# Patient Record
Sex: Female | Born: 1950
Health system: Southern US, Community
[De-identification: ages and names within clinical notes are randomized; demographics above are authoritative.]

## PROBLEM LIST (undated history)

## (undated) ENCOUNTER — Ambulatory Visit (HOSPITAL_COMMUNITY): Discharge status: Absent without leave | Payer: Medicare PPO

## (undated) ENCOUNTER — Ambulatory Visit (HOSPITAL_COMMUNITY): Admission: EM | Payer: Medicare PPO | Source: Home / Self Care

## (undated) DIAGNOSIS — F3281 Premenstrual dysphoric disorder: Secondary | ICD-10-CM

## (undated) DIAGNOSIS — I10 Essential (primary) hypertension: Secondary | ICD-10-CM

## (undated) DIAGNOSIS — K601 Chronic anal fissure: Secondary | ICD-10-CM

## (undated) DIAGNOSIS — H9190 Unspecified hearing loss, unspecified ear: Secondary | ICD-10-CM

## (undated) DIAGNOSIS — D649 Anemia, unspecified: Secondary | ICD-10-CM

## (undated) DIAGNOSIS — H903 Sensorineural hearing loss, bilateral: Secondary | ICD-10-CM

## (undated) DIAGNOSIS — E119 Type 2 diabetes mellitus without complications: Secondary | ICD-10-CM

## (undated) DIAGNOSIS — C50919 Malignant neoplasm of unspecified site of unspecified female breast: Secondary | ICD-10-CM

## (undated) DIAGNOSIS — K219 Gastro-esophageal reflux disease without esophagitis: Secondary | ICD-10-CM

## (undated) DIAGNOSIS — D509 Iron deficiency anemia, unspecified: Secondary | ICD-10-CM

## (undated) DIAGNOSIS — G479 Sleep disorder, unspecified: Secondary | ICD-10-CM

## (undated) DIAGNOSIS — E669 Obesity, unspecified: Secondary | ICD-10-CM

## (undated) DIAGNOSIS — E785 Hyperlipidemia, unspecified: Secondary | ICD-10-CM

## (undated) HISTORY — DX: Gastro-esophageal reflux disease without esophagitis: K21.9

## (undated) HISTORY — DX: Essential (primary) hypertension: I10

## (undated) HISTORY — PX: BREAST BIOPSY: SHX20

## (undated) HISTORY — DX: Unspecified hearing loss, unspecified ear: H91.90

## (undated) HISTORY — DX: Type 2 diabetes mellitus without complications: E11.9

## (undated) HISTORY — PX: GUM SURGERY: SHX658

## (undated) HISTORY — DX: Chronic anal fissure: K60.1

## (undated) HISTORY — DX: Premenstrual dysphoric disorder: F32.81

## (undated) HISTORY — DX: Sleep disorder, unspecified: G47.9

## (undated) HISTORY — DX: Hyperlipidemia, unspecified: E78.5

## (undated) HISTORY — DX: Obesity, unspecified: E66.9

## (undated) HISTORY — PX: ABDOMINAL HYSTERECTOMY: SHX81

## (undated) HISTORY — DX: Sensorineural hearing loss, bilateral: H90.3

## (undated) HISTORY — DX: Anemia, unspecified: D64.9

## (undated) HISTORY — PX: BLADDER REPAIR: SHX76

## (undated) HISTORY — PX: TUBAL LIGATION: SHX77

## (undated) HISTORY — DX: Iron deficiency anemia, unspecified: D50.9

## (undated) HISTORY — PX: MANDIBLE FRACTURE SURGERY: SHX706

## (undated) HISTORY — DX: Malignant neoplasm of unspecified site of unspecified female breast: C50.919

---

## 1998-09-09 ENCOUNTER — Encounter (HOSPITAL_COMMUNITY): Admission: RE | Admit: 1998-09-09 | Discharge: 1998-12-08 | Payer: Self-pay | Admitting: Internal Medicine

## 1999-09-12 ENCOUNTER — Encounter (HOSPITAL_COMMUNITY): Admission: RE | Admit: 1999-09-12 | Discharge: 1999-12-11 | Payer: Self-pay | Admitting: Internal Medicine

## 2000-04-27 ENCOUNTER — Ambulatory Visit (HOSPITAL_COMMUNITY): Admission: RE | Admit: 2000-04-27 | Discharge: 2000-04-27 | Payer: Self-pay | Admitting: Gastroenterology

## 2001-01-07 ENCOUNTER — Encounter (HOSPITAL_COMMUNITY): Admission: RE | Admit: 2001-01-07 | Discharge: 2001-04-07 | Payer: Self-pay | Admitting: Internal Medicine

## 2001-01-31 ENCOUNTER — Encounter: Admission: RE | Admit: 2001-01-31 | Discharge: 2001-01-31 | Payer: Self-pay | Admitting: Internal Medicine

## 2001-01-31 ENCOUNTER — Encounter: Payer: Self-pay | Admitting: Internal Medicine

## 2001-01-31 ENCOUNTER — Inpatient Hospital Stay (HOSPITAL_COMMUNITY): Admission: AD | Admit: 2001-01-31 | Discharge: 2001-02-06 | Payer: Self-pay | Admitting: Internal Medicine

## 2001-02-04 ENCOUNTER — Encounter: Payer: Self-pay | Admitting: Internal Medicine

## 2001-03-02 ENCOUNTER — Encounter: Payer: Self-pay | Admitting: Internal Medicine

## 2001-03-02 ENCOUNTER — Ambulatory Visit (HOSPITAL_COMMUNITY): Admission: RE | Admit: 2001-03-02 | Discharge: 2001-03-02 | Payer: Self-pay | Admitting: Internal Medicine

## 2001-05-18 DIAGNOSIS — D649 Anemia, unspecified: Secondary | ICD-10-CM

## 2001-05-18 HISTORY — DX: Anemia, unspecified: D64.9

## 2001-08-25 ENCOUNTER — Other Ambulatory Visit: Admission: RE | Admit: 2001-08-25 | Discharge: 2001-08-25 | Payer: Self-pay | Admitting: Gynecology

## 2002-08-23 ENCOUNTER — Other Ambulatory Visit: Admission: RE | Admit: 2002-08-23 | Discharge: 2002-08-23 | Payer: Self-pay | Admitting: Gynecology

## 2002-08-31 ENCOUNTER — Ambulatory Visit (HOSPITAL_COMMUNITY): Admission: RE | Admit: 2002-08-31 | Discharge: 2002-08-31 | Payer: Self-pay | Admitting: Internal Medicine

## 2002-09-01 ENCOUNTER — Encounter: Payer: Self-pay | Admitting: Gynecology

## 2002-09-01 ENCOUNTER — Encounter: Admission: RE | Admit: 2002-09-01 | Discharge: 2002-09-01 | Payer: Self-pay | Admitting: Gynecology

## 2002-10-06 ENCOUNTER — Encounter (HOSPITAL_COMMUNITY): Admission: RE | Admit: 2002-10-06 | Discharge: 2002-10-27 | Payer: Self-pay | Admitting: Internal Medicine

## 2002-11-30 ENCOUNTER — Encounter (INDEPENDENT_AMBULATORY_CARE_PROVIDER_SITE_OTHER): Payer: Self-pay | Admitting: Specialist

## 2002-11-30 ENCOUNTER — Inpatient Hospital Stay (HOSPITAL_COMMUNITY): Admission: RE | Admit: 2002-11-30 | Discharge: 2002-12-03 | Payer: Self-pay | Admitting: Gynecology

## 2003-02-16 ENCOUNTER — Emergency Department (HOSPITAL_COMMUNITY): Admission: EM | Admit: 2003-02-16 | Discharge: 2003-02-16 | Payer: Self-pay | Admitting: Emergency Medicine

## 2003-02-16 ENCOUNTER — Encounter: Payer: Self-pay | Admitting: Emergency Medicine

## 2003-08-13 ENCOUNTER — Encounter: Admission: RE | Admit: 2003-08-13 | Discharge: 2003-08-13 | Payer: Self-pay | Admitting: Internal Medicine

## 2003-08-13 IMAGING — CR DG KNEE 1-2V*L*
2 series · 2 of 2 positions shown · non-contrast
Comparison: none

CLINICAL DATA: Pain and swelling.  No known injury.
 TWO VIEW LEFT KNEE ? [DATE]

[view not recorded (1 of 2)]
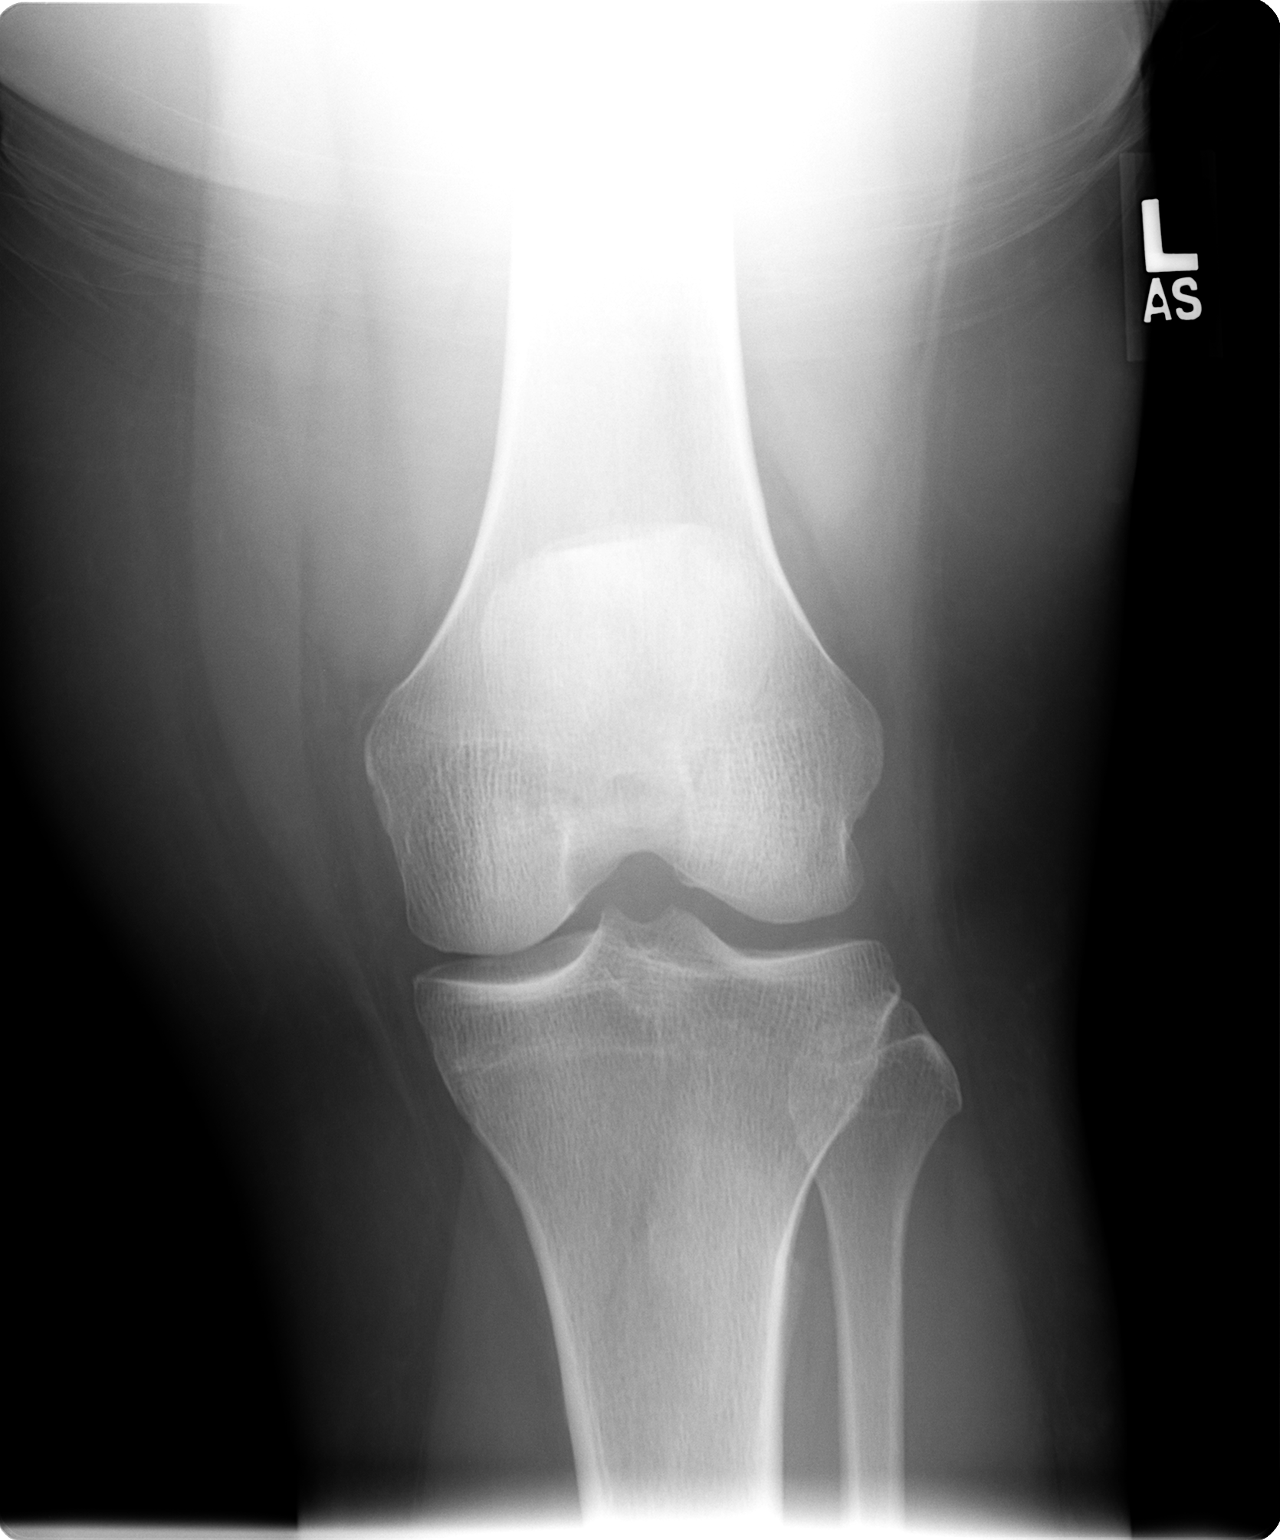

[view not recorded (2 of 2)]
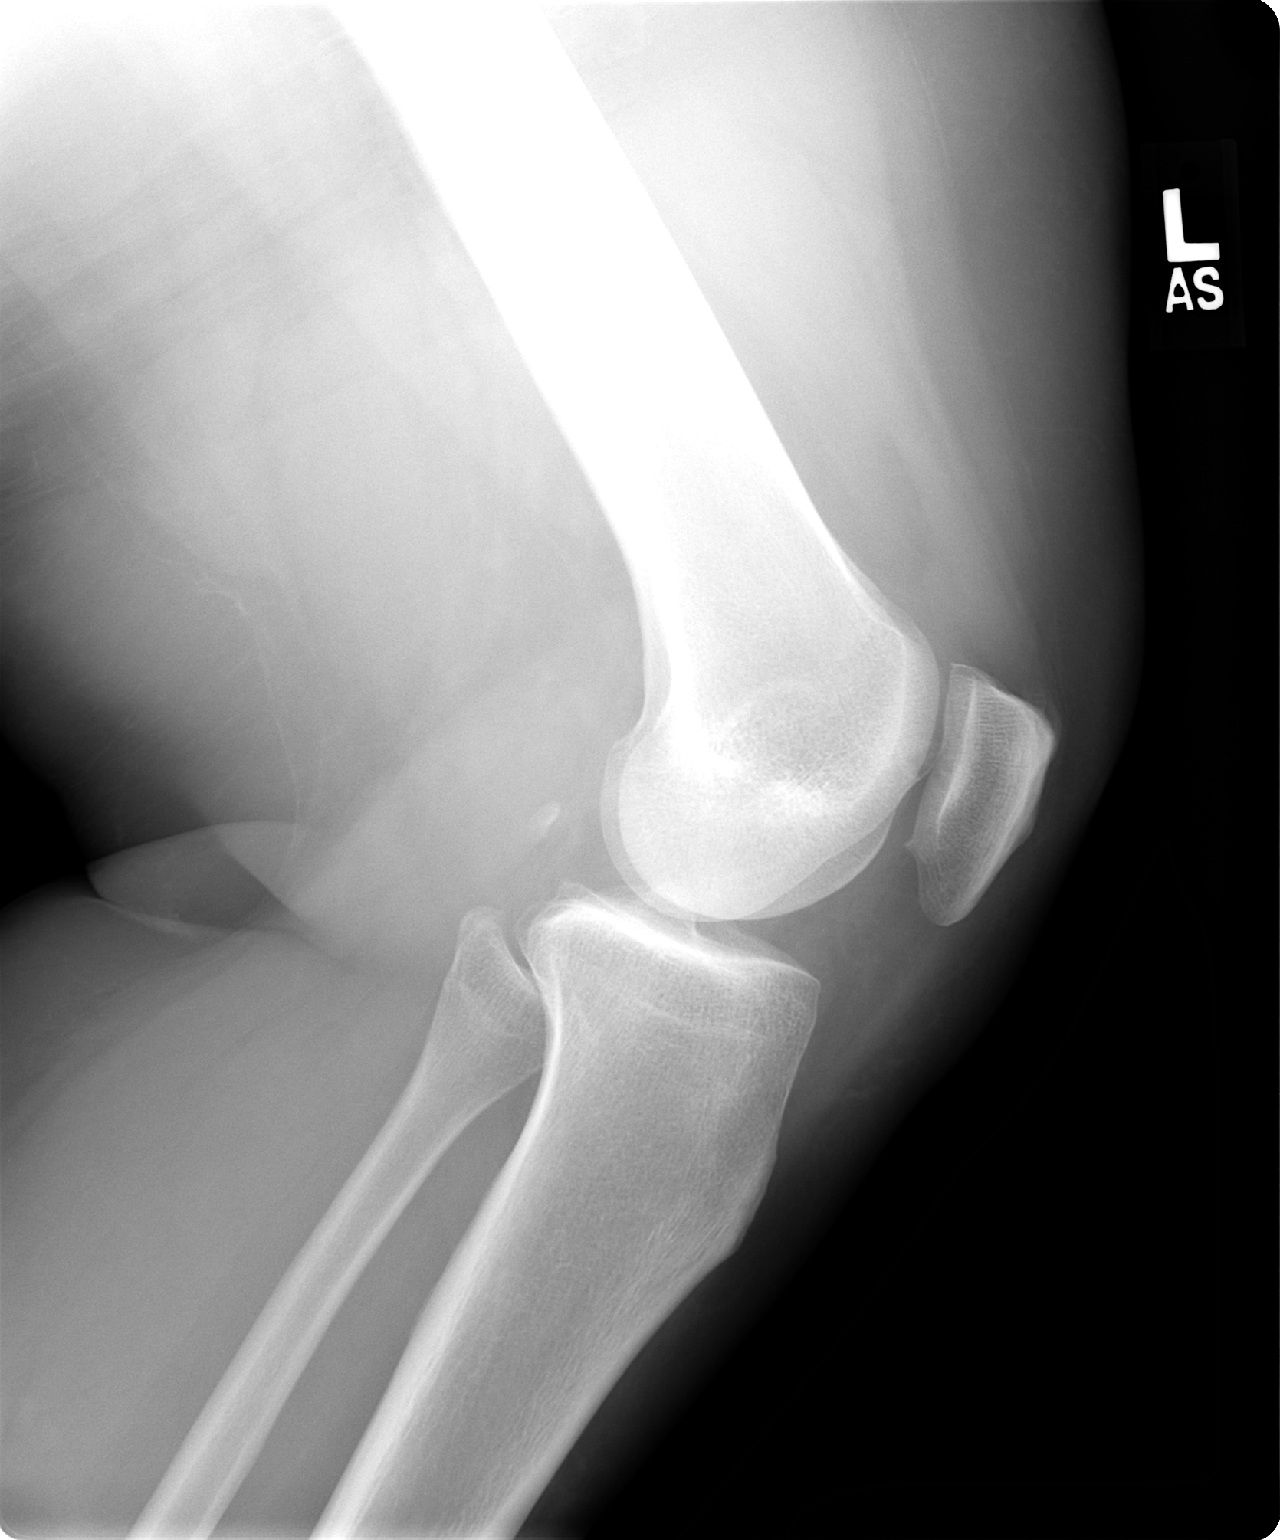

[2 of 2 positions shown; findings below may reference images not displayed]

FINDINGS: There may be a tiny knee effusion present.  Minimal osteophytic beaking off the patella at the patellofemoral compartment is noted.  No other significant abnormality is identified.
 IMPRESSION
 1.  Minimal degenerative changes primarily in the patellofemoral compartment.
 2.  Query tiny effusion.

## 2003-11-26 ENCOUNTER — Encounter: Admission: RE | Admit: 2003-11-26 | Discharge: 2003-11-26 | Payer: Self-pay | Admitting: Internal Medicine

## 2003-11-26 IMAGING — CR DG CERVICAL SPINE COMPLETE 4+V
6 series · 6 of 6 positions shown · non-contrast
Comparison: none

CLINICAL DATA: 52 year old with neck and shoulder pain.  No known injury.
 CERVICAL SPINE, COMPLETE 
 Five views of the cervical spine were performed showing mild degenerative change.  Alignment is within normal limits and there is no evidence for acute fracture dislocation.  Prevertebral soft tissues and lung apices are within normal limits. 
 IMPRESSION
 1.  No evidence for acute abnormality of the cervical spine. 
 2.  Mild degenerative changes.

[view not recorded (1 of 6)]
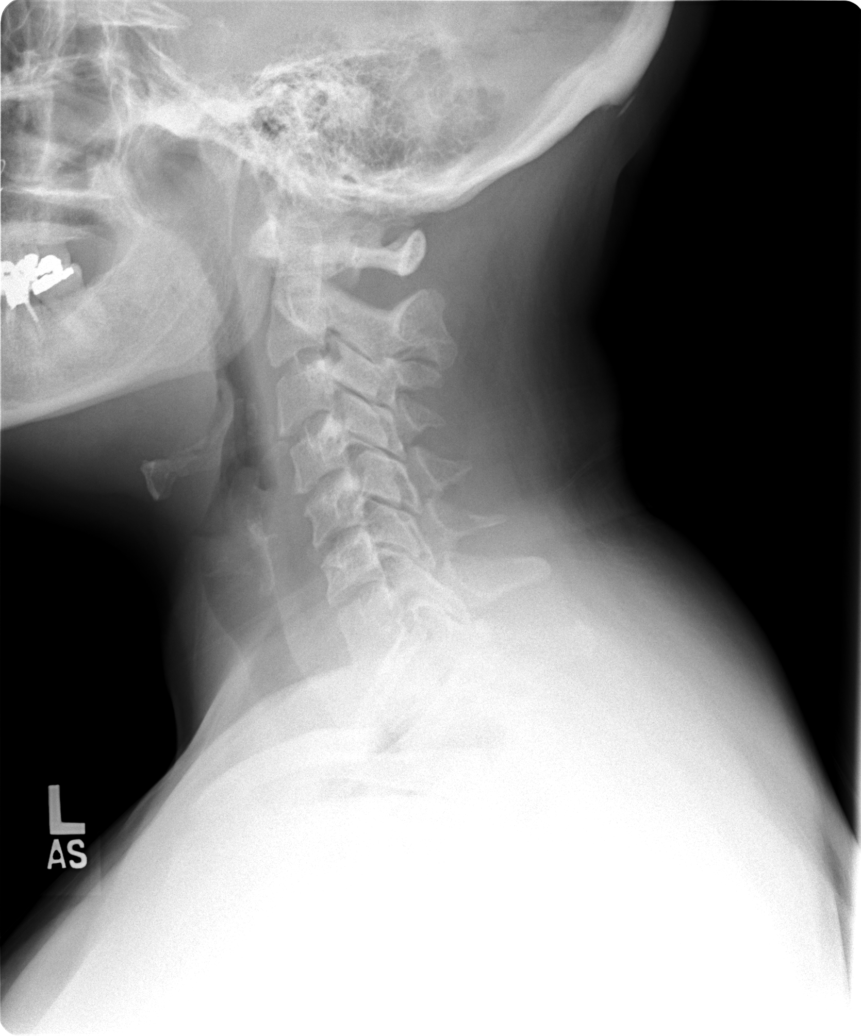

[view not recorded (2 of 6)]
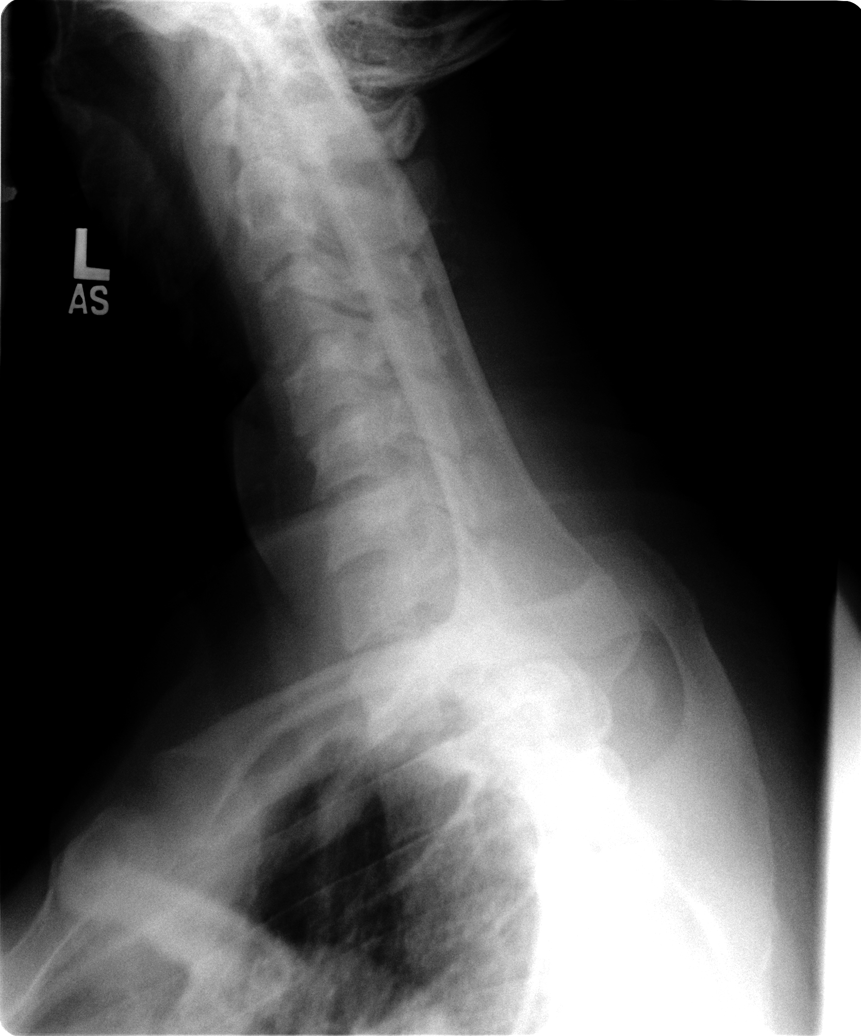

[view not recorded (3 of 6)]
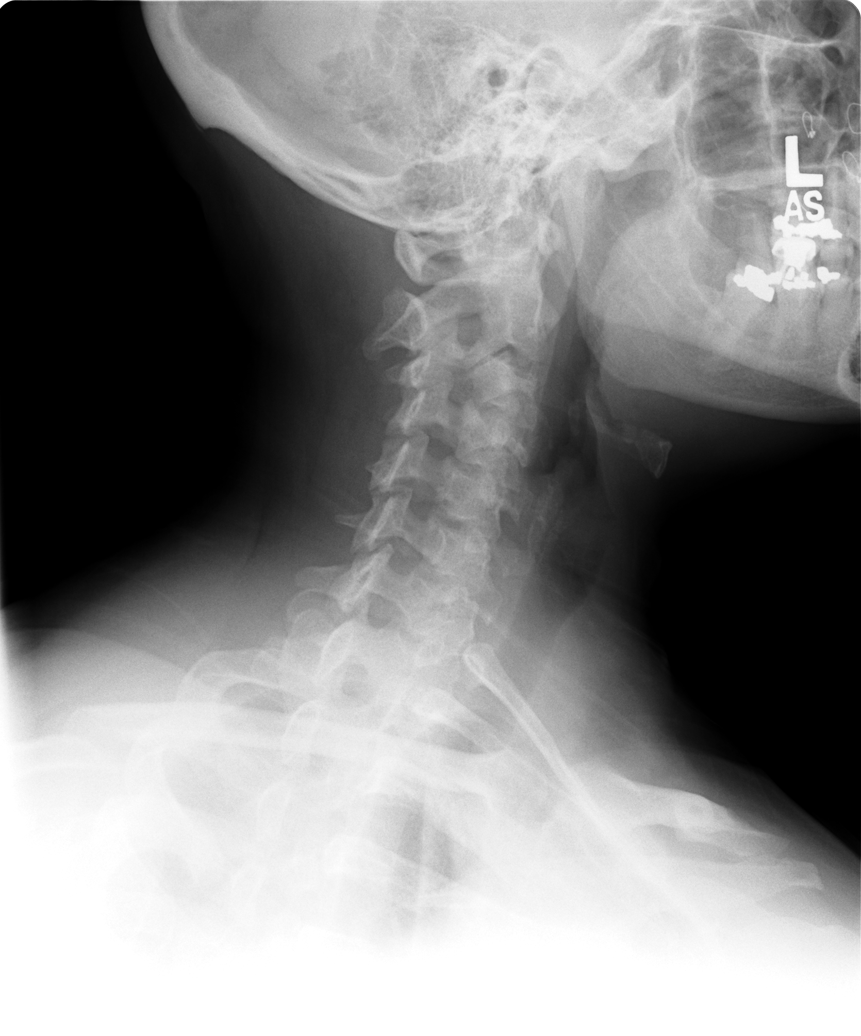

[view not recorded (4 of 6)]
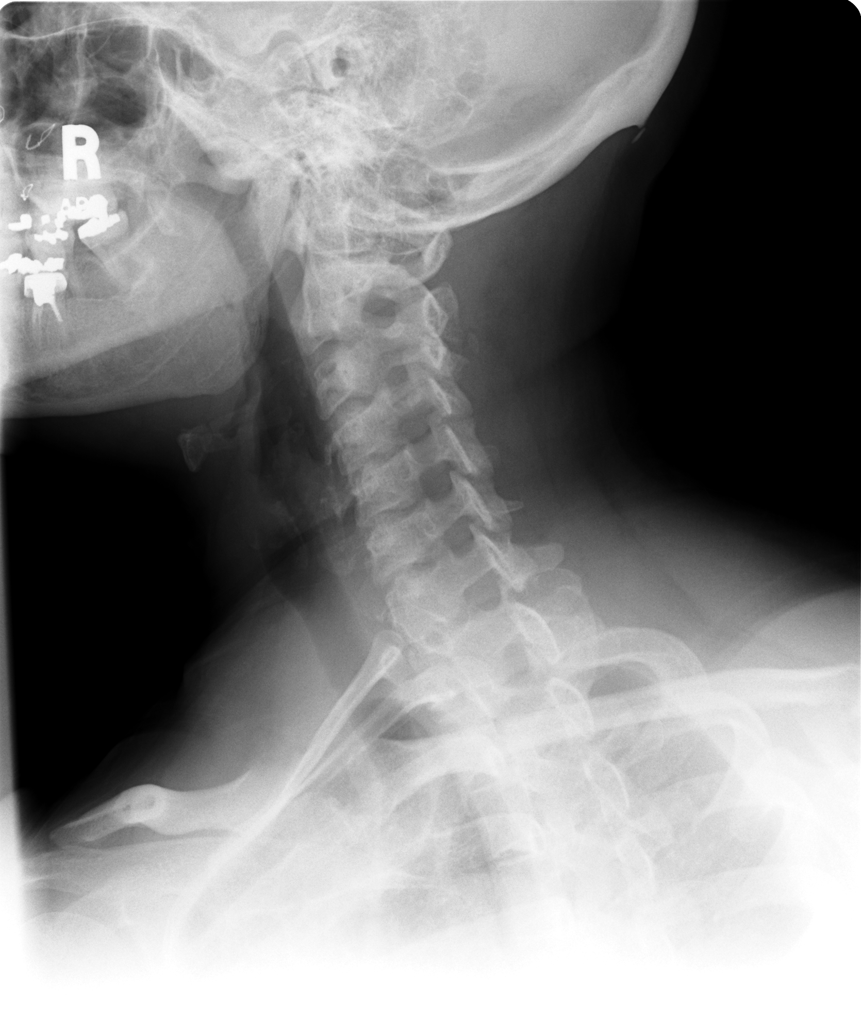

[view not recorded (5 of 6)]
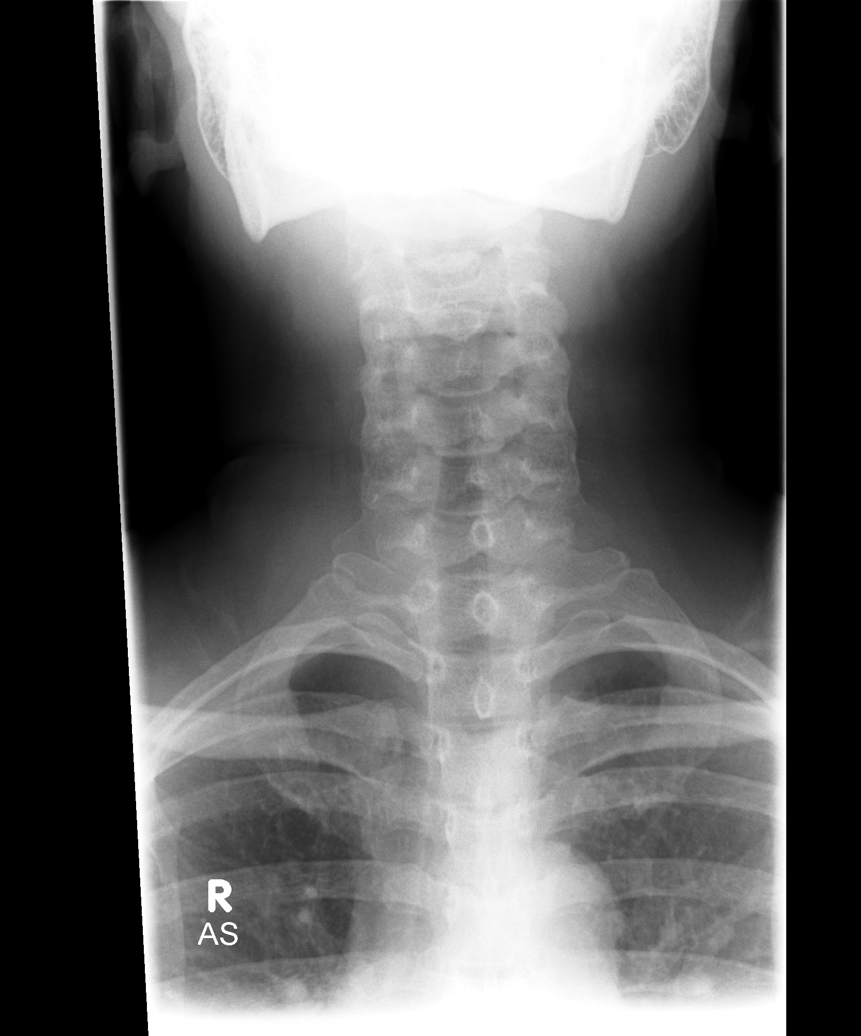

[view not recorded (6 of 6)]
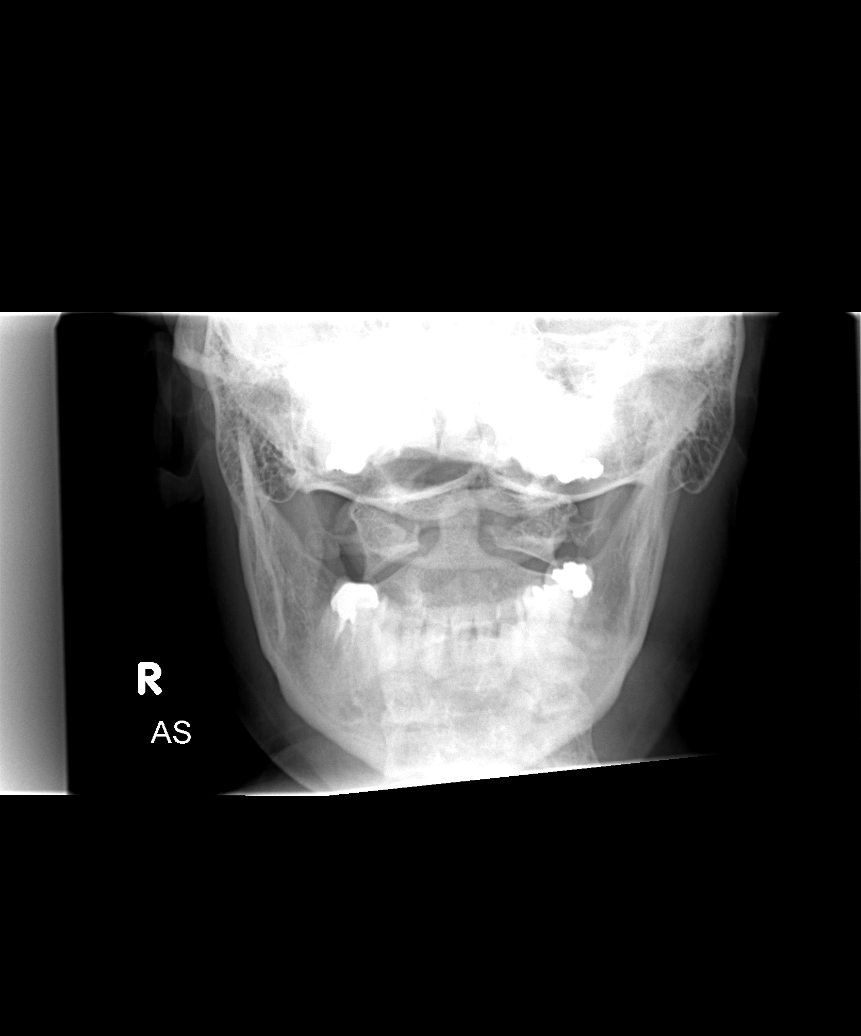

[6 of 6 positions shown; findings below may reference images not displayed]

## 2004-04-01 ENCOUNTER — Encounter: Admission: RE | Admit: 2004-04-01 | Discharge: 2004-04-01 | Payer: Self-pay | Admitting: Gynecology

## 2004-04-04 ENCOUNTER — Other Ambulatory Visit: Admission: RE | Admit: 2004-04-04 | Discharge: 2004-04-04 | Payer: Self-pay | Admitting: Gynecology

## 2004-09-08 ENCOUNTER — Encounter: Admission: RE | Admit: 2004-09-08 | Discharge: 2004-09-08 | Payer: Self-pay | Admitting: Internal Medicine

## 2004-09-08 IMAGING — CR DG CERVICAL SPINE COMPLETE 4+V
6 series · 6 of 6 positions shown · non-contrast
Comparison: none

CLINICAL DATA: Neck pain for one month. 
 CERVICAL SPINE SERIES: 
 Comparison [DATE].  
 Vertebral body height and alignment are maintained.  Prevertebral soft tissues appear normal.  Mild appearing degenerative disease cervical spine at C5-6 and C6-7 again noted.

[w c-spine lat *]
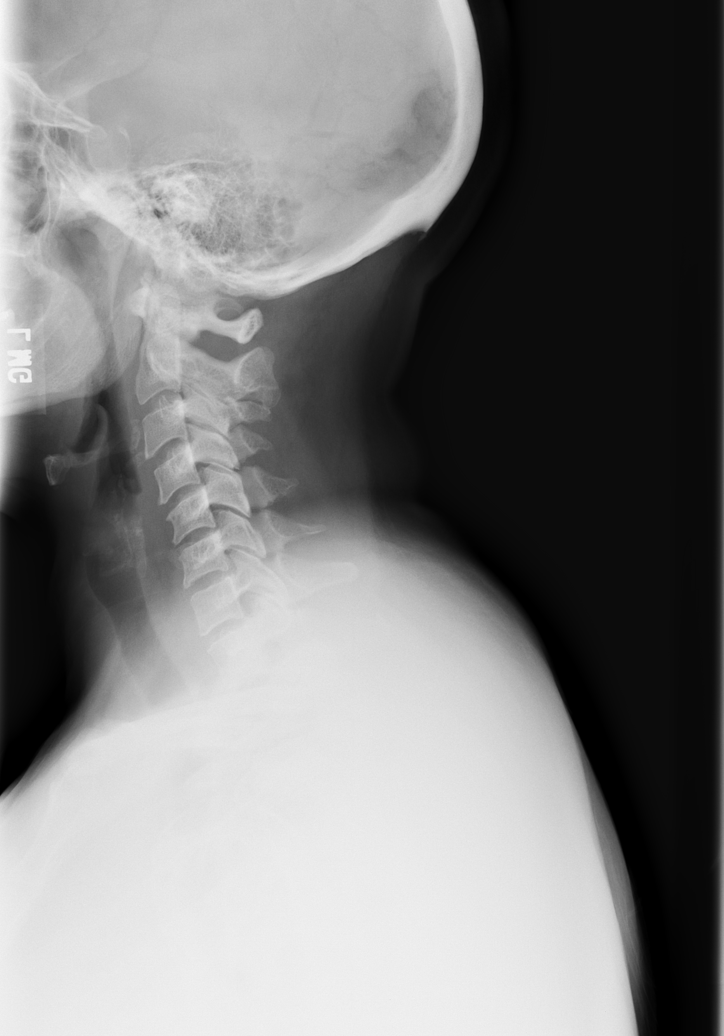

[w c-spine oblique]
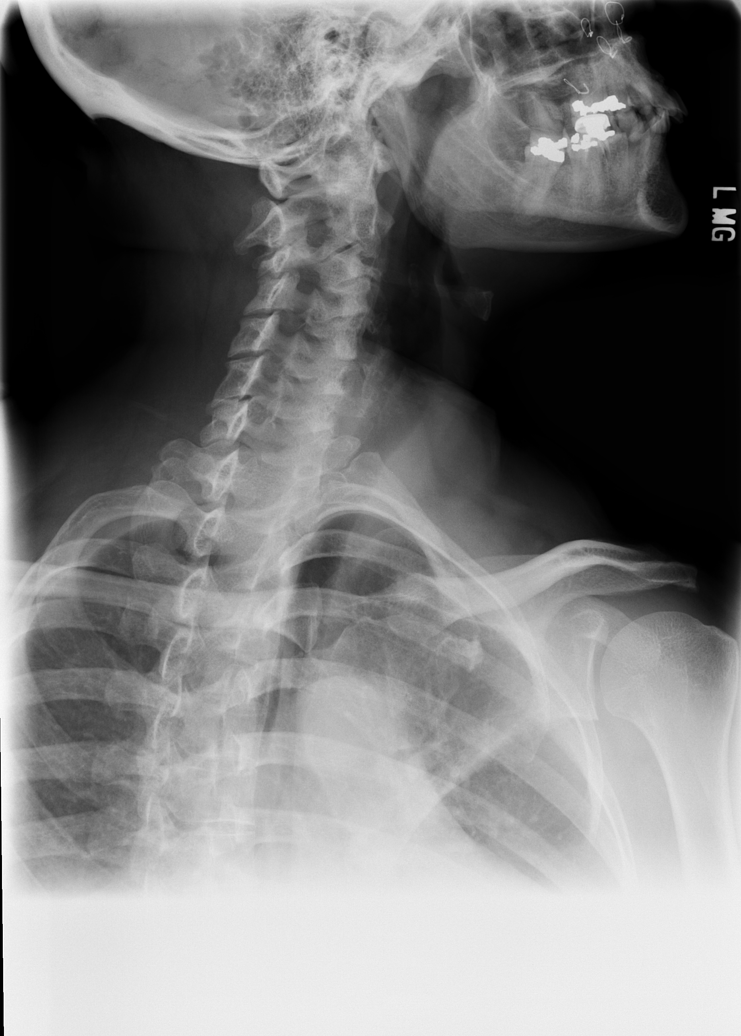

[w c-spine oblique *]
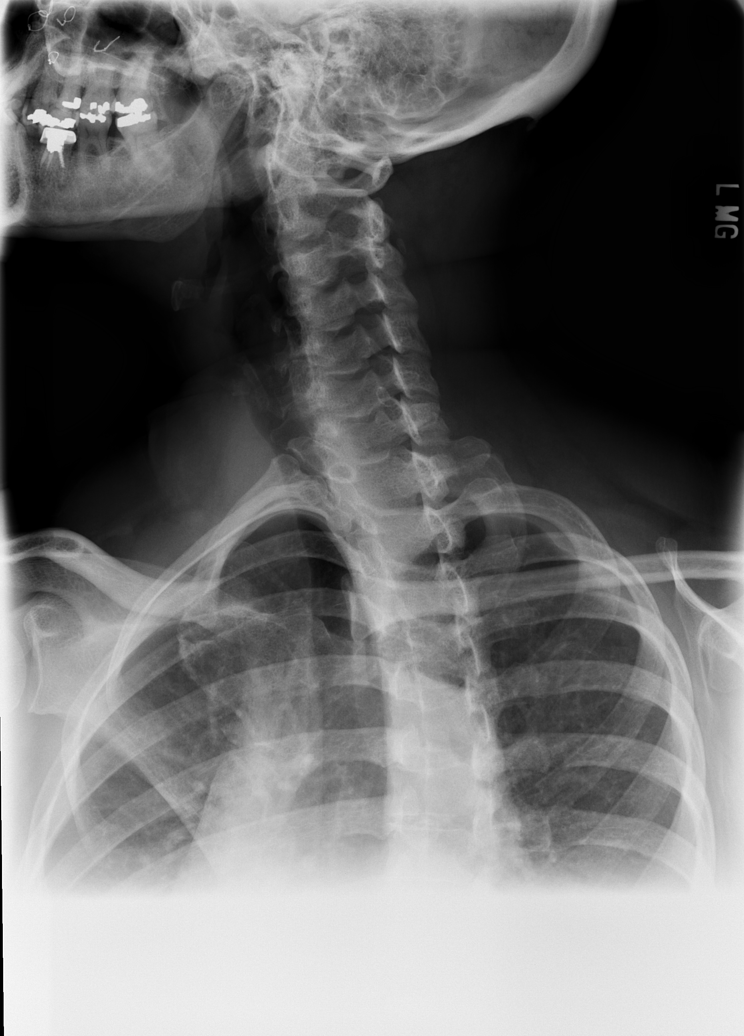

[w c-spine a.p. *]
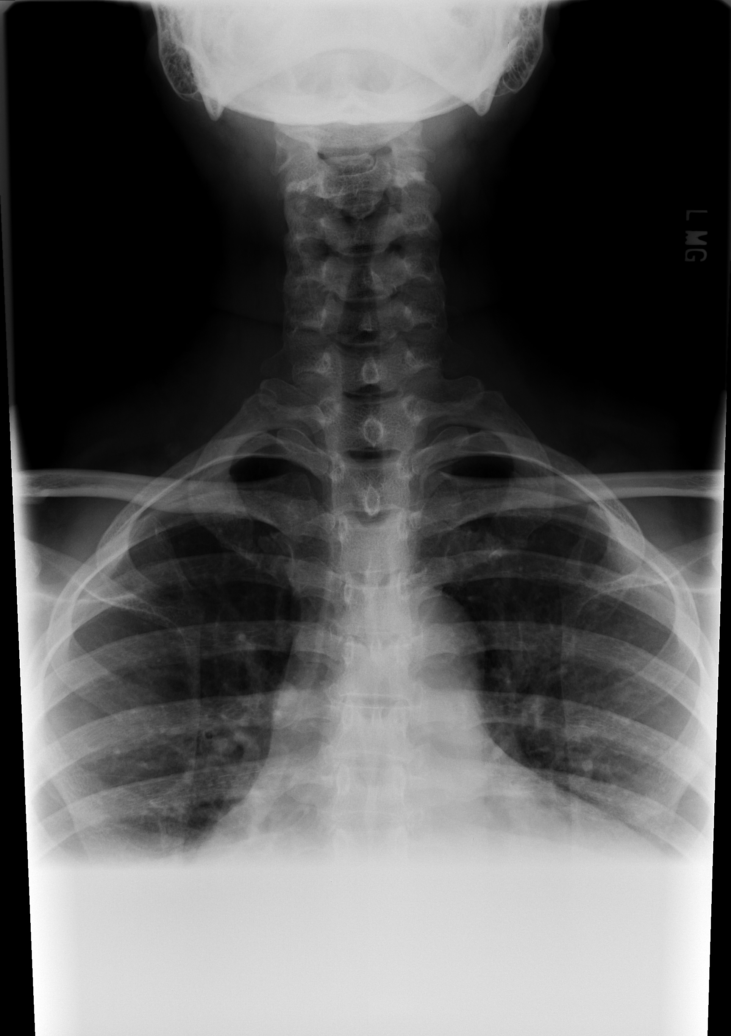

[w c-spine odontoid (1 of 2)]
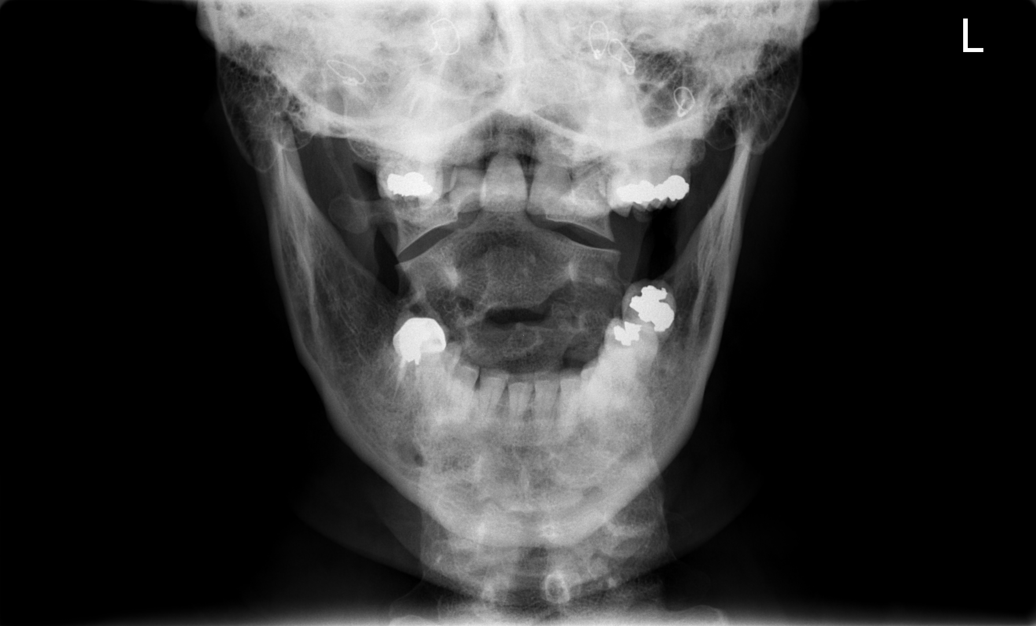

[w c-spine odontoid (2 of 2)]
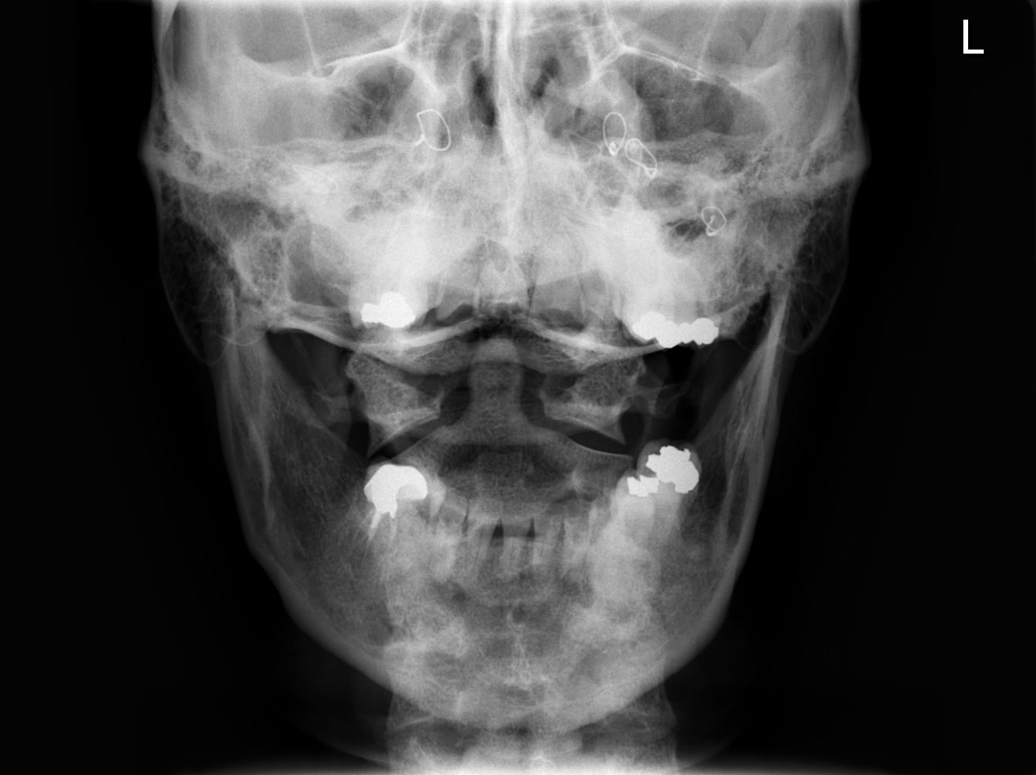

[6 of 6 positions shown; findings below may reference images not displayed]

IMPRESSION: No interval change or acute abnormality of the cervical spine.

## 2004-11-17 ENCOUNTER — Encounter: Admission: RE | Admit: 2004-11-17 | Discharge: 2004-11-17 | Payer: Self-pay | Admitting: Internal Medicine

## 2004-11-17 IMAGING — CR DG FOOT COMPLETE 3+V*R*
3 series · 3 of 3 positions shown · non-contrast
Comparison: none

CLINICAL DATA: Injured in [place], with pain.
 RIGHT FOOT ? 3 VIEW:
 Three views of the right foot were obtained.  There is slight cortical irregularity to the distal aspect of the proximal phalanx of the right fifth toe most consistent with a nondisplaced fracture.  A small focus of bony density is also noted overlying the mid portion of the proximal phalanx of the right fifth digit which may represent either avulsion fragment or callus.  No other acute abnormality is seen.

[t foot ap right]
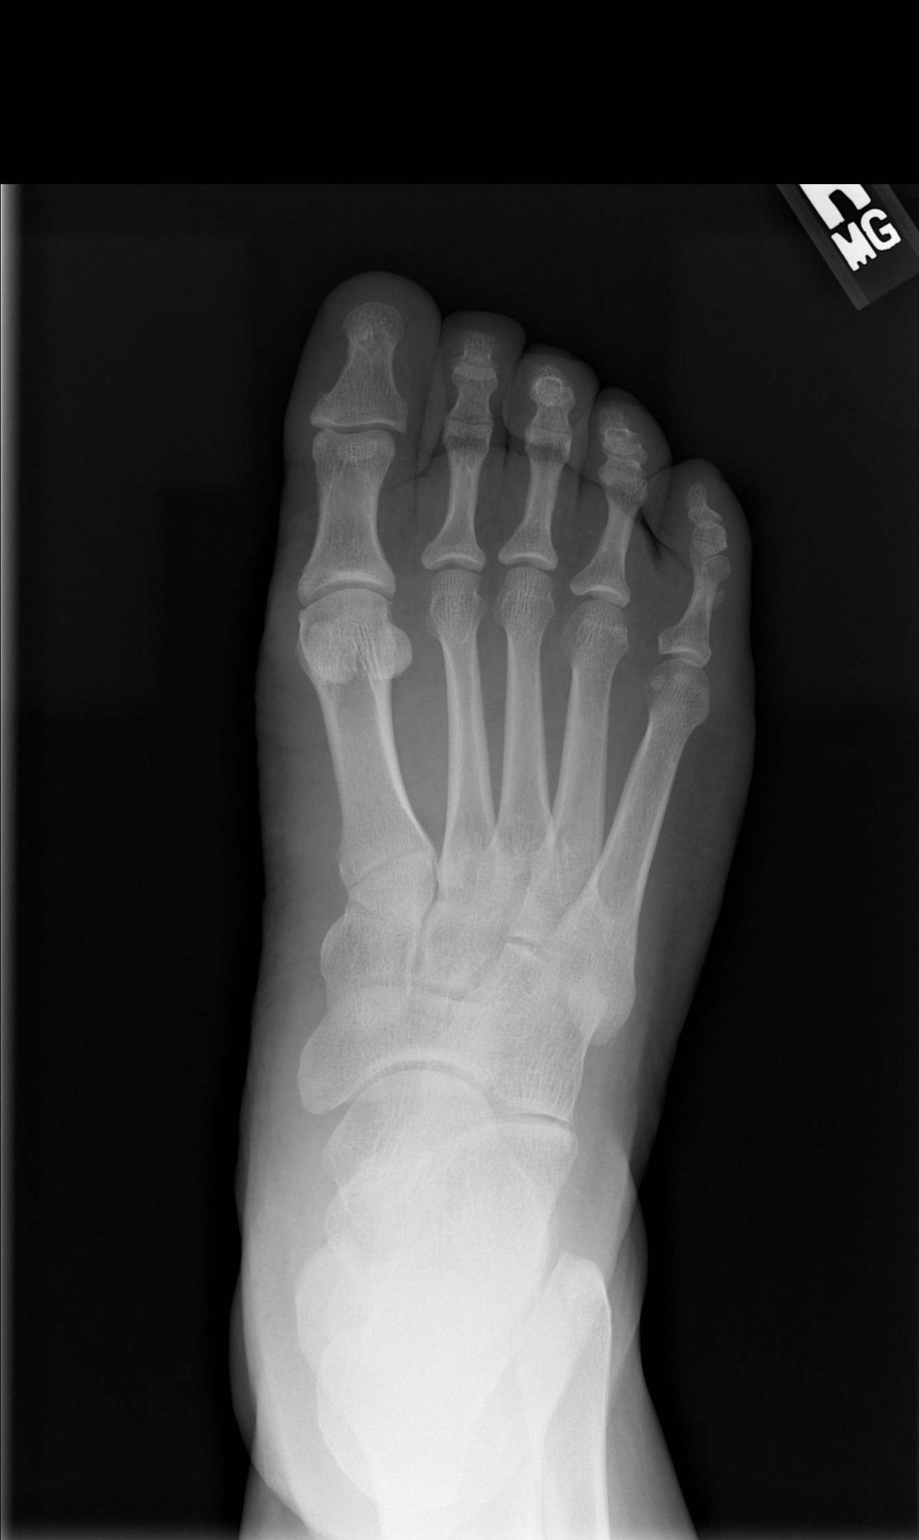

[t foot oblique right]
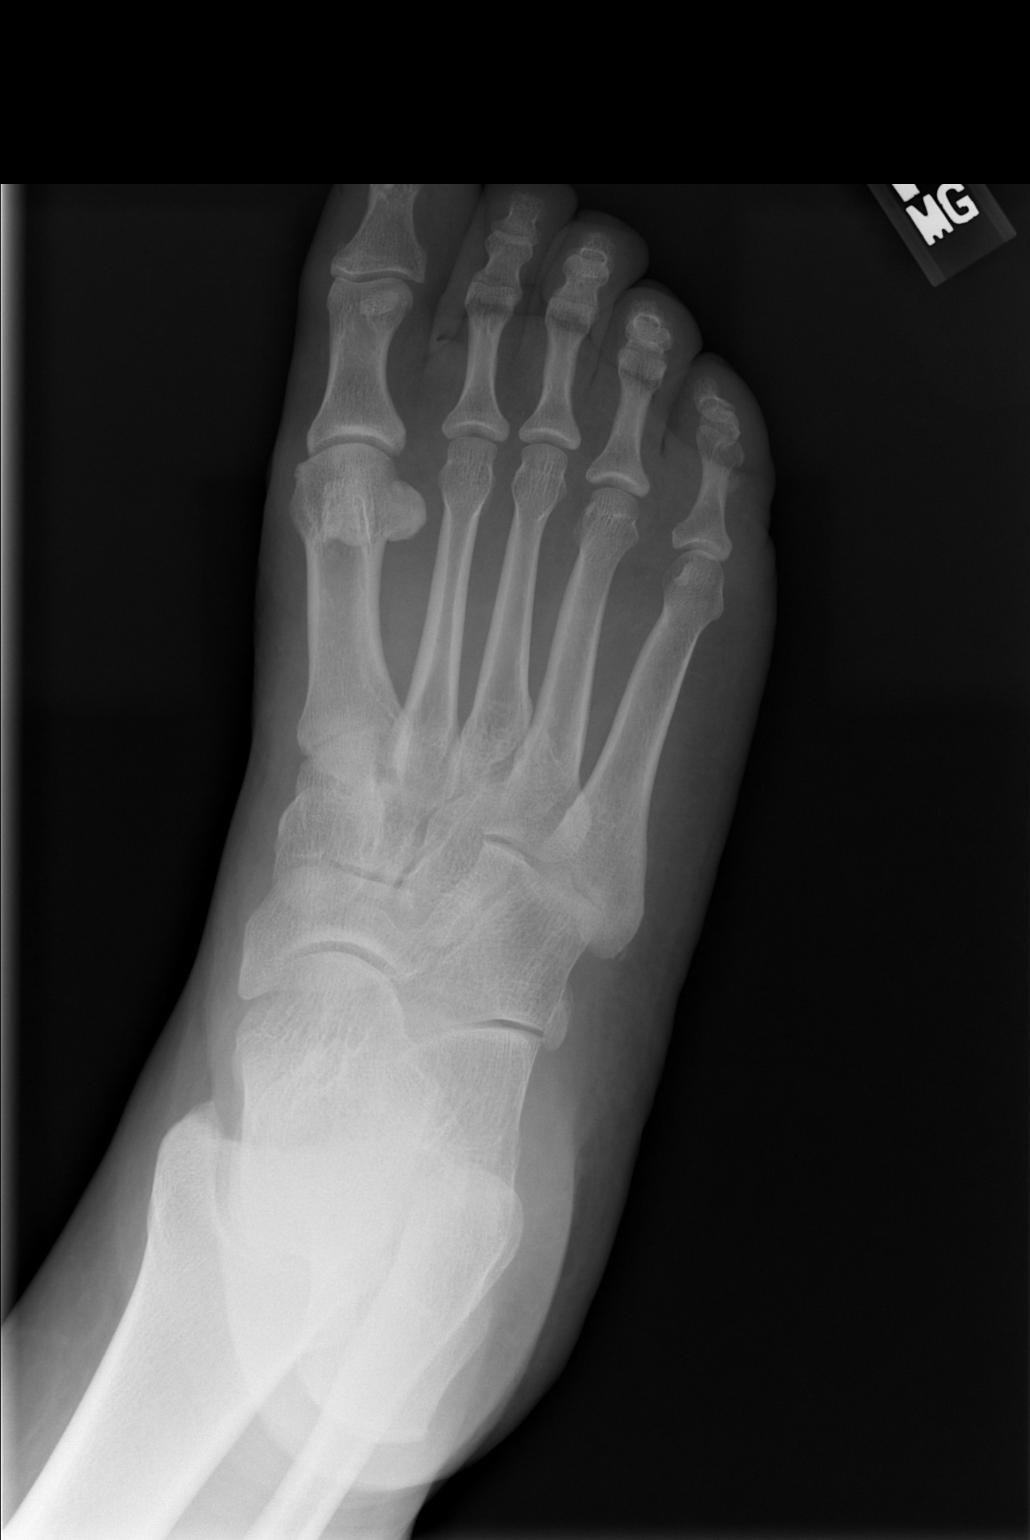

[t foot lat right]
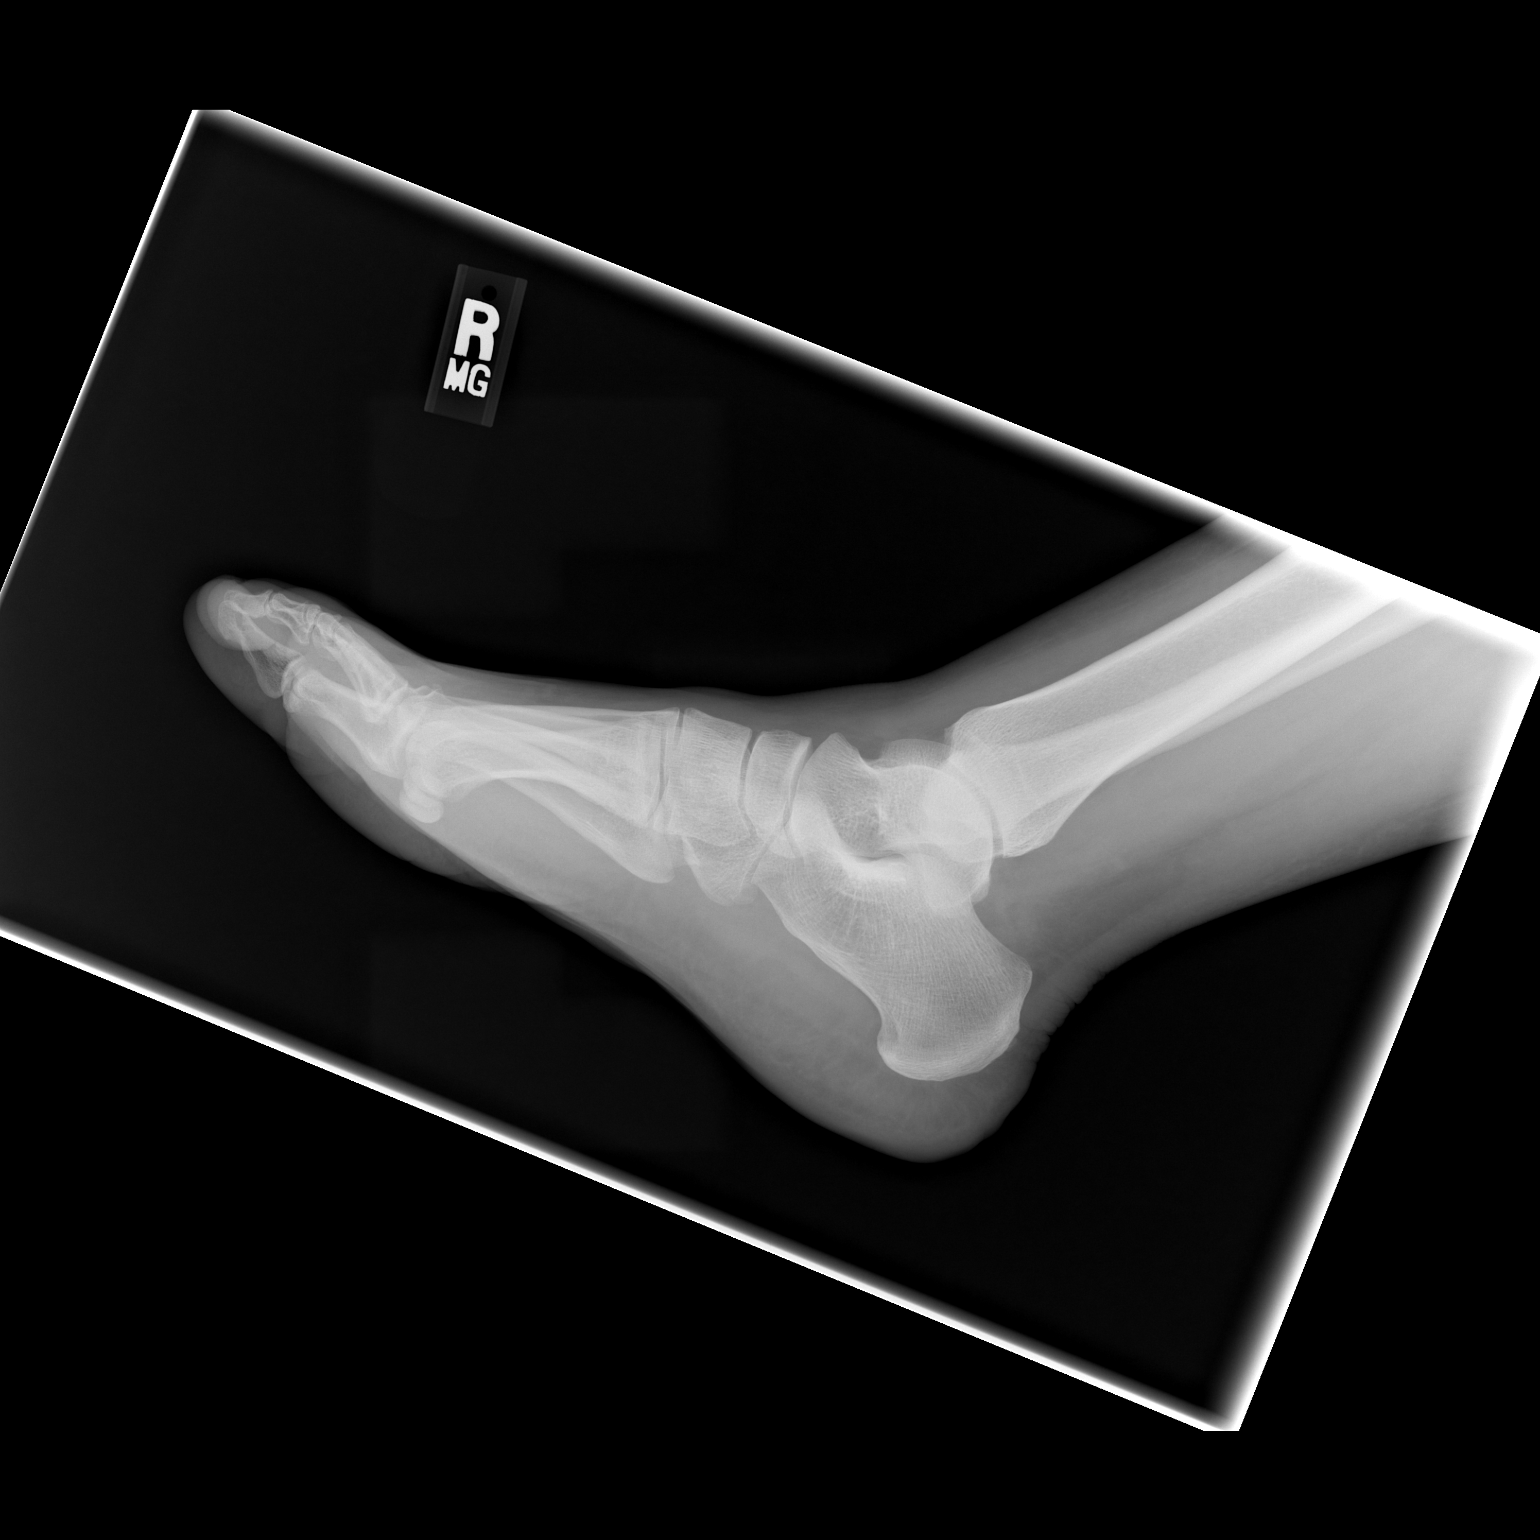

[3 of 3 positions shown; findings below may reference images not displayed]

IMPRESSION: Probable healing fracture of the proximal phalanx of the right fifth toe.

## 2005-02-14 ENCOUNTER — Ambulatory Visit (HOSPITAL_BASED_OUTPATIENT_CLINIC_OR_DEPARTMENT_OTHER): Admission: RE | Admit: 2005-02-14 | Discharge: 2005-02-14 | Payer: Self-pay | Admitting: Internal Medicine

## 2005-02-15 ENCOUNTER — Ambulatory Visit: Payer: Self-pay | Admitting: Internal Medicine

## 2005-04-06 ENCOUNTER — Other Ambulatory Visit: Admission: RE | Admit: 2005-04-06 | Discharge: 2005-04-06 | Payer: Self-pay | Admitting: Gynecology

## 2005-05-12 ENCOUNTER — Encounter: Admission: RE | Admit: 2005-05-12 | Discharge: 2005-05-12 | Payer: Self-pay | Admitting: Gynecology

## 2005-05-12 IMAGING — MG MM MAMMO SCREENING
6 series · 6 of 6 positions shown · non-contrast
Comparison: none

SCREENING MAMMOGRAM:
There is a fibroglandular pattern.  No masses or malignant type calcifications are identified.  
Compared with prior studies.

[R CC]
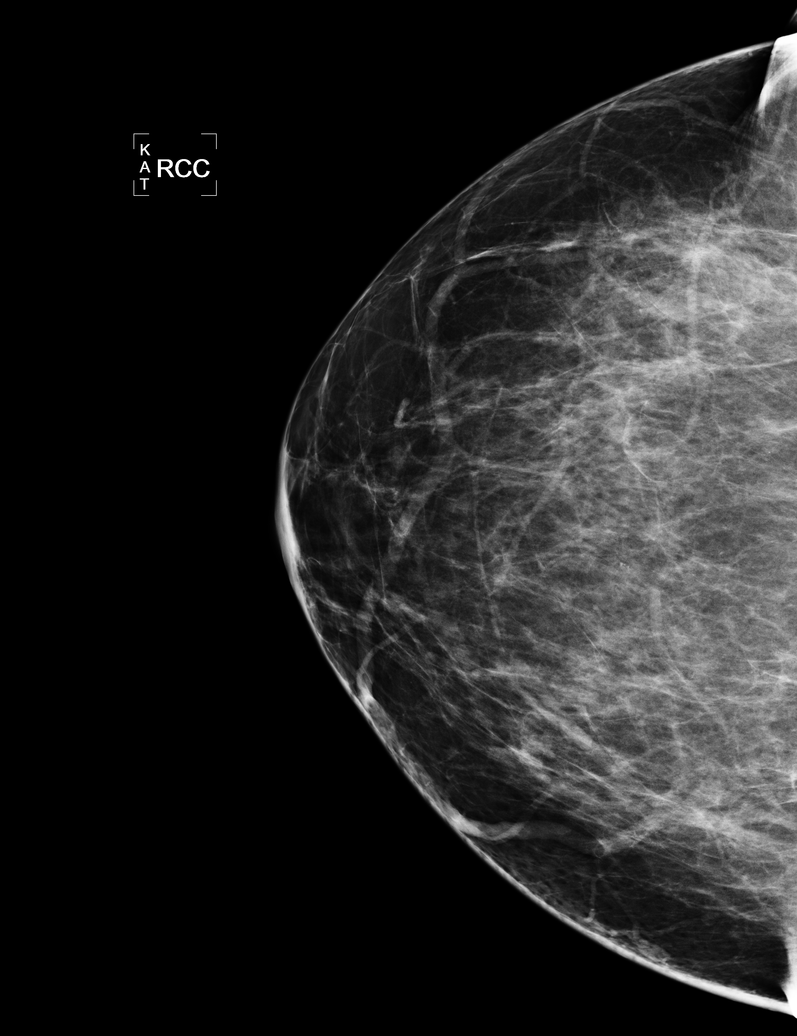

[R MLO (1 of 2)]
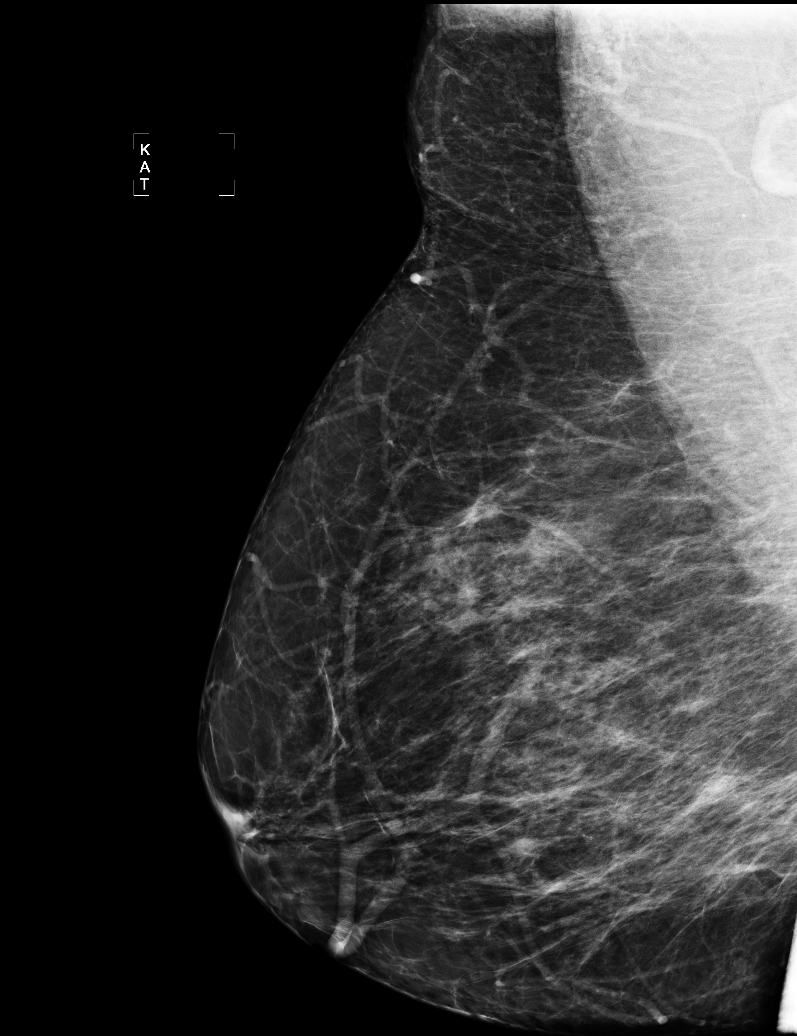

[L CC]
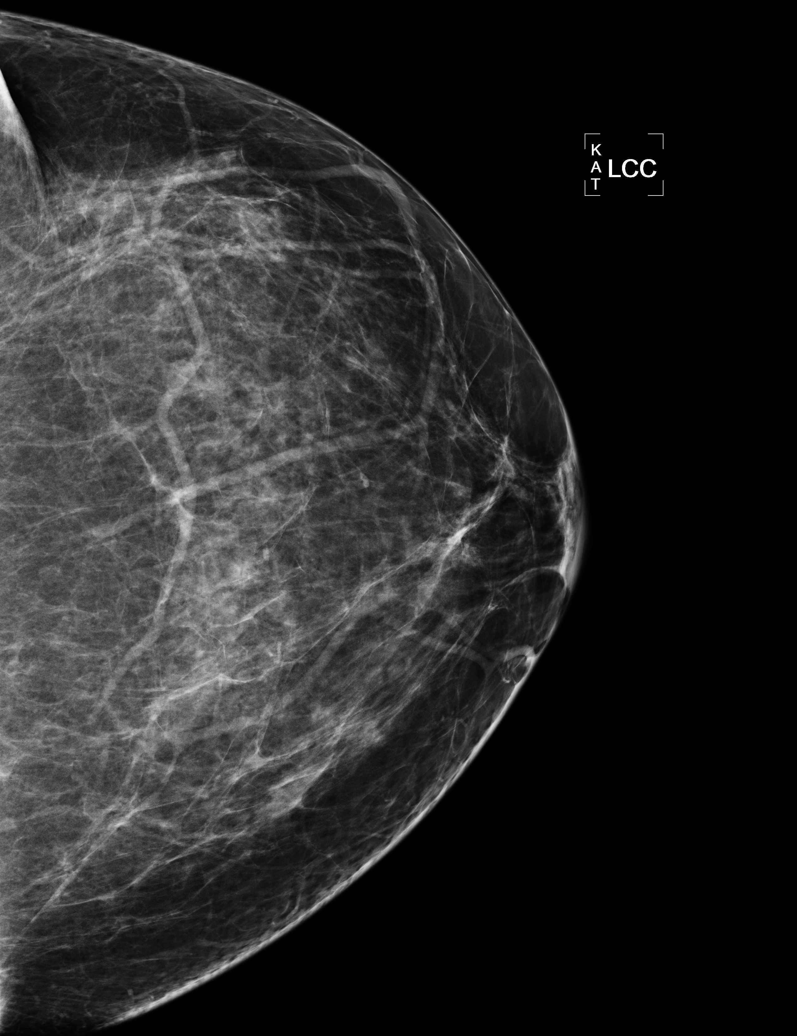

[L MLO (1 of 2)]
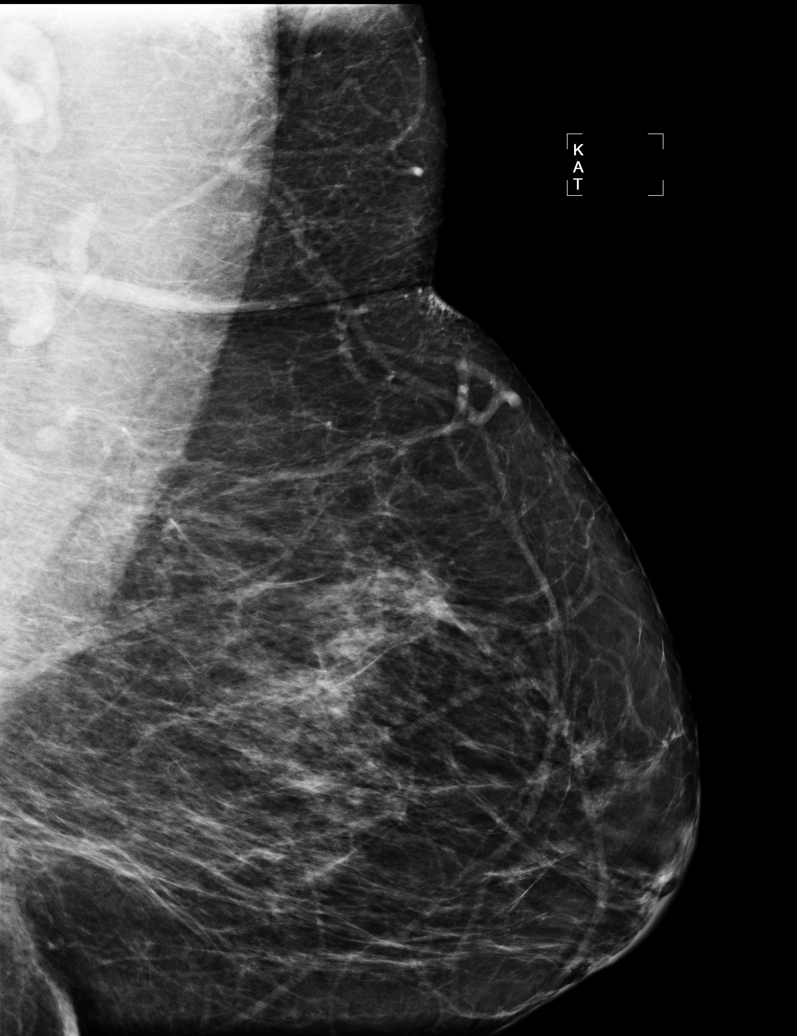

[R MLO (2 of 2)]
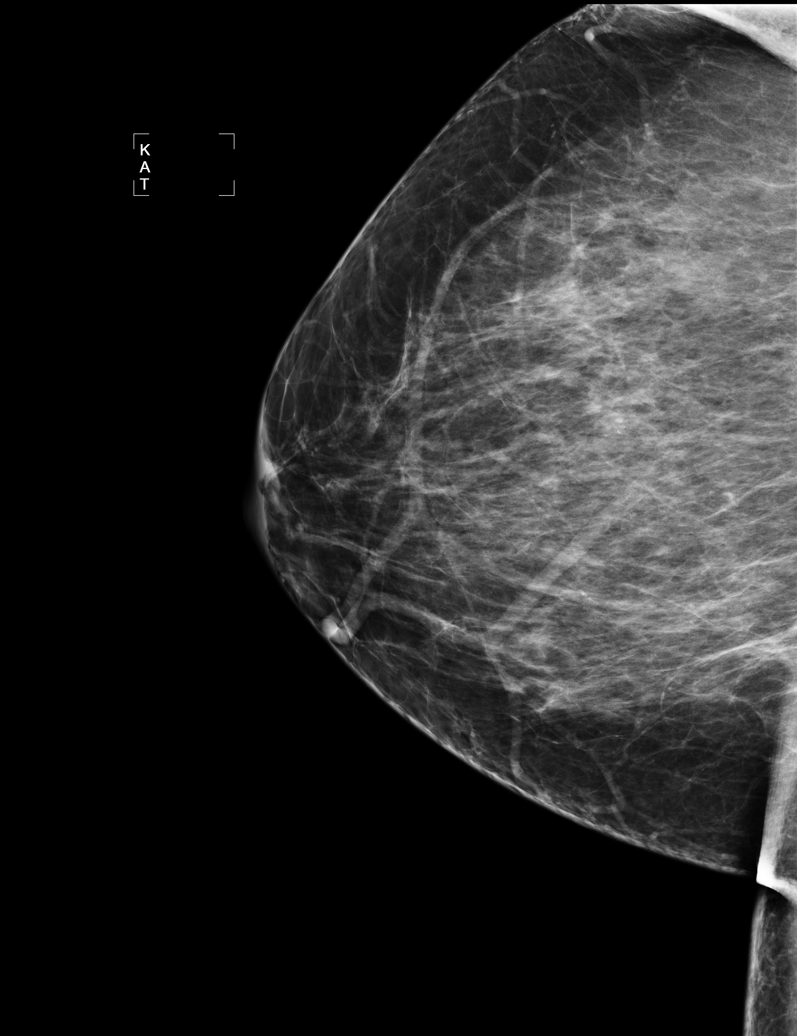

[L MLO (2 of 2)]
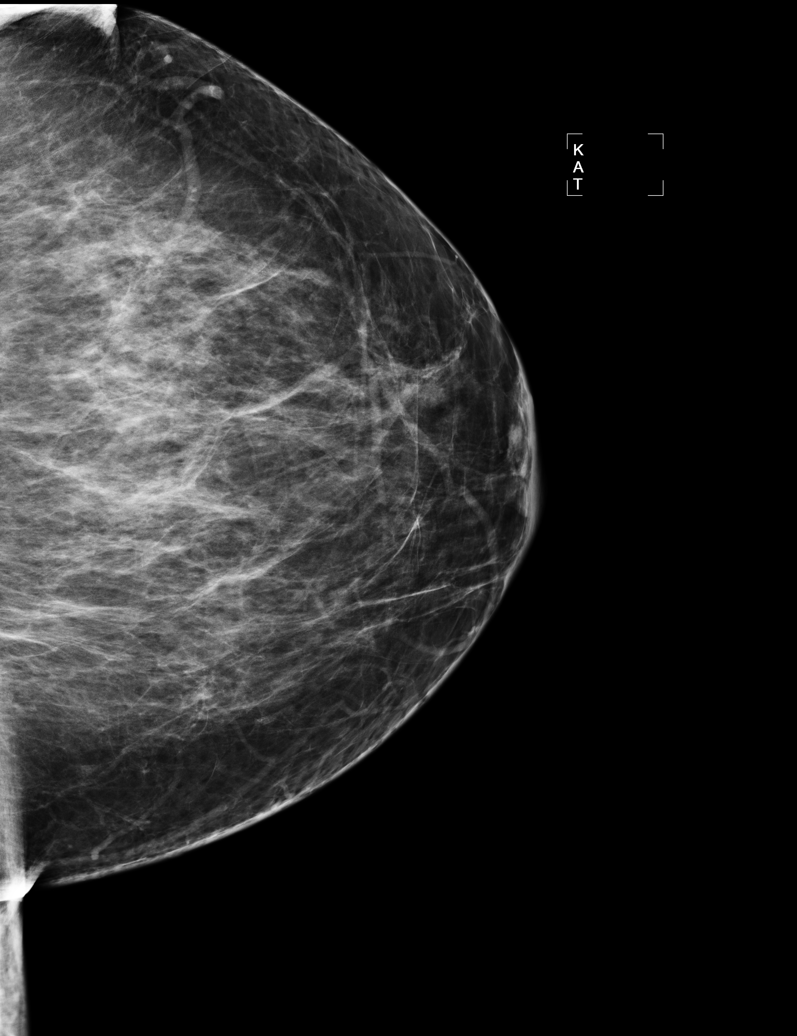

[6 of 6 positions shown; findings below may reference images not displayed]

IMPRESSION: No specific mammographic evidence of malignancy.  Next screening mammogram is recommended in one 
year.

ASSESSMENT: Negative - BI-RADS 1

Screening mammogram in 1 year.

## 2005-09-16 ENCOUNTER — Encounter: Admission: RE | Admit: 2005-09-16 | Discharge: 2005-09-16 | Payer: Self-pay | Admitting: Internal Medicine

## 2005-09-16 IMAGING — CR DG CHEST 2V
1 series · 1 of 1 positions shown · non-contrast
Comparison: Report of [DATE] is reviewed.  Images are not available at the time of dictation.

CLINICAL DATA: Dyspnea and hypertension.  Asthma.
 TWO VIEW CHEST:

[view not recorded]
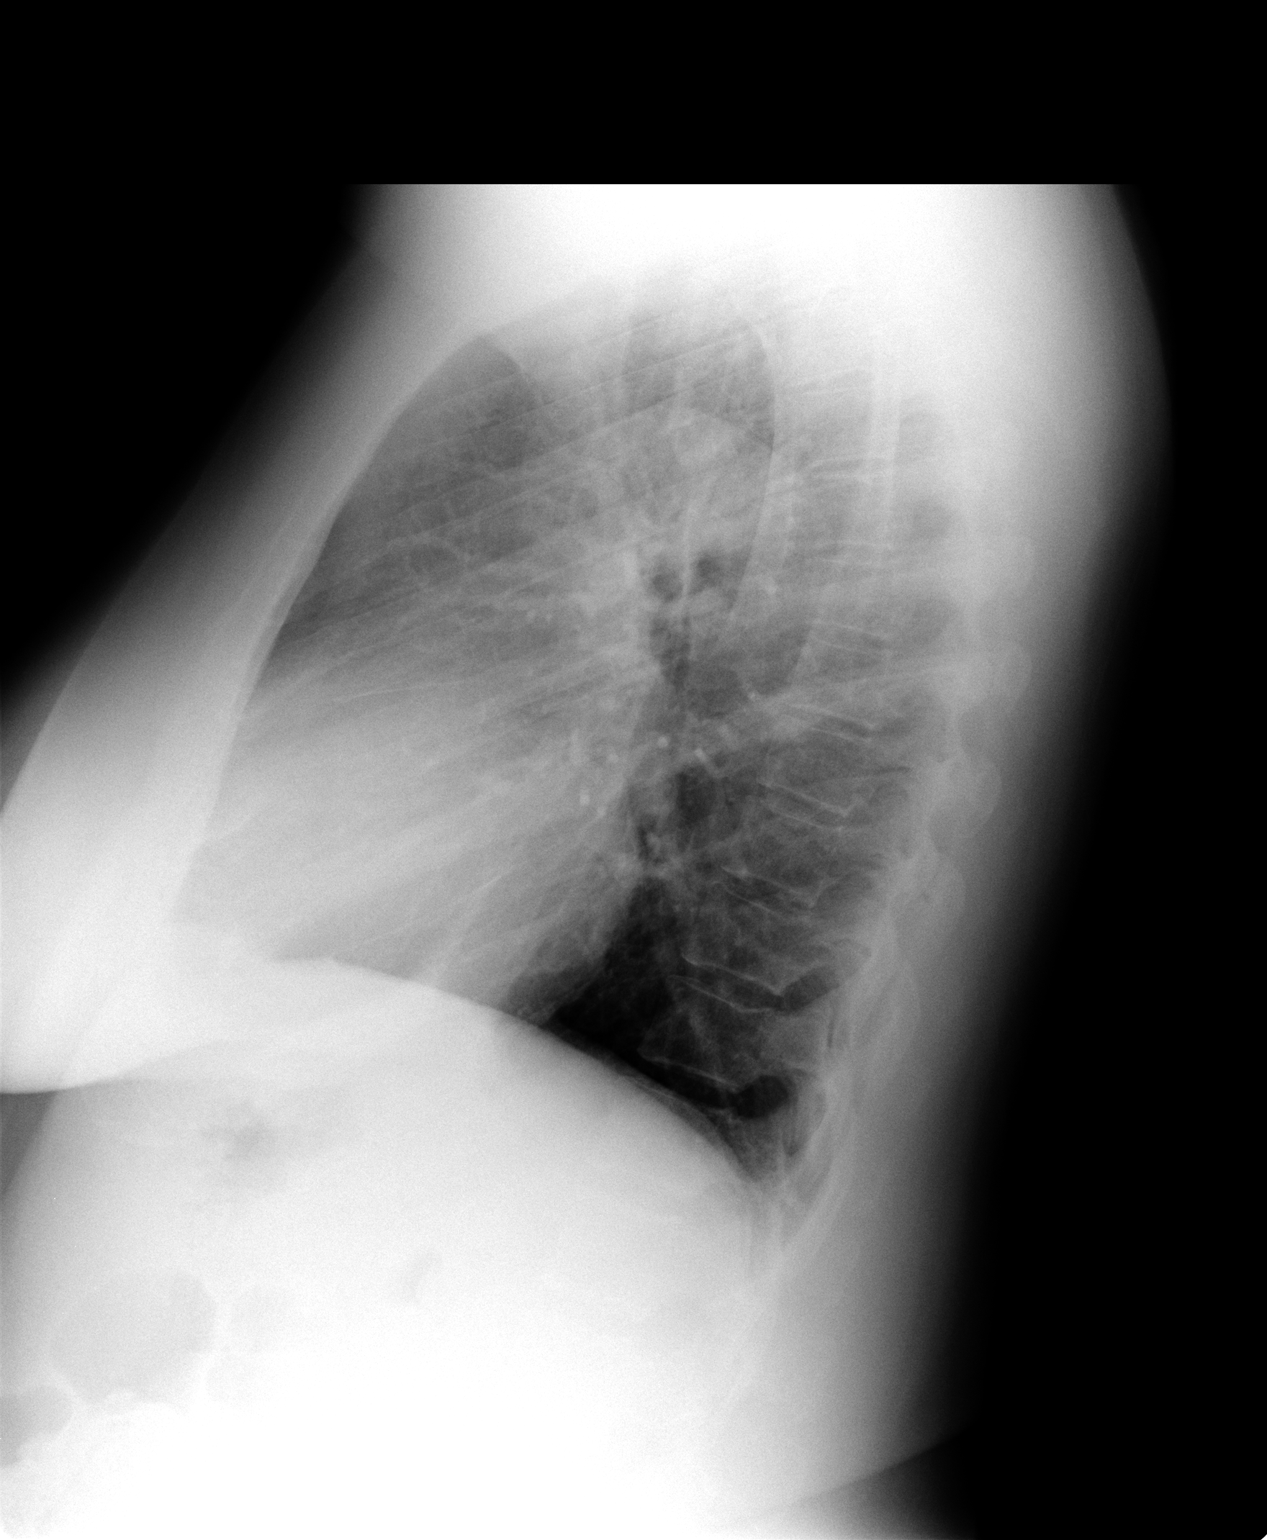

[1 of 1 positions shown; findings below may reference images not displayed]

FINDINGS: Heart size is mildly enlarged.  Lungs are clear.  No pleural fluid.
IMPRESSION: No acute findings.

## 2005-11-02 ENCOUNTER — Encounter (INDEPENDENT_AMBULATORY_CARE_PROVIDER_SITE_OTHER): Payer: Self-pay | Admitting: *Deleted

## 2005-11-02 ENCOUNTER — Ambulatory Visit (HOSPITAL_COMMUNITY): Admission: RE | Admit: 2005-11-02 | Discharge: 2005-11-02 | Payer: Self-pay | Admitting: Gastroenterology

## 2006-04-14 ENCOUNTER — Other Ambulatory Visit: Admission: RE | Admit: 2006-04-14 | Discharge: 2006-04-14 | Payer: Self-pay | Admitting: Gynecology

## 2006-08-16 ENCOUNTER — Encounter: Admission: RE | Admit: 2006-08-16 | Discharge: 2006-08-16 | Payer: Self-pay | Admitting: Internal Medicine

## 2006-08-16 IMAGING — CR DG CERVICAL SPINE COMPLETE 4+V
5 series · 5 of 5 positions shown · non-contrast
Comparison: none

CLINICAL DATA: Neck pain and bilateral arm pain

Cervical spine 5 view:
Comparison [DATE]. Mild narrowing of interspaces C4-C7 with small anterior
endplate spurs. Normal alignment. No prevertebral soft tissue swelling. Negative
for fracture or other acute bone injury. Multiple dental restorations.

[w c-spine lat]
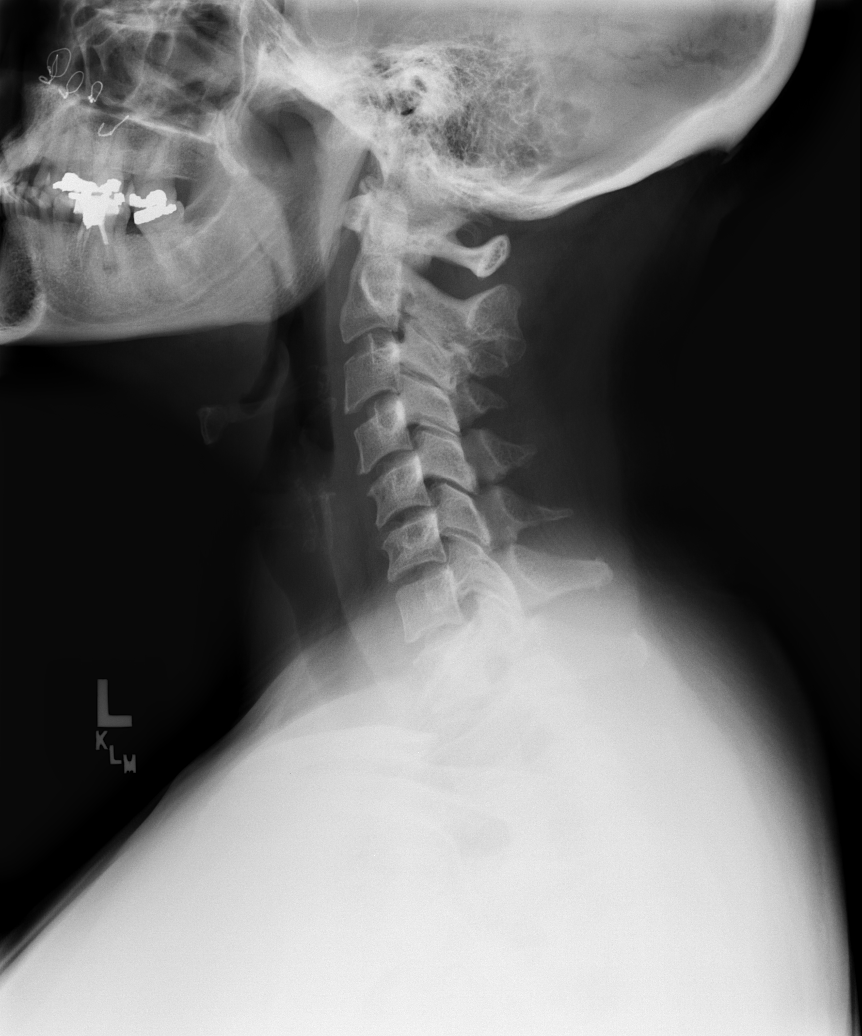

[w c-spine oblique (1 of 2)]
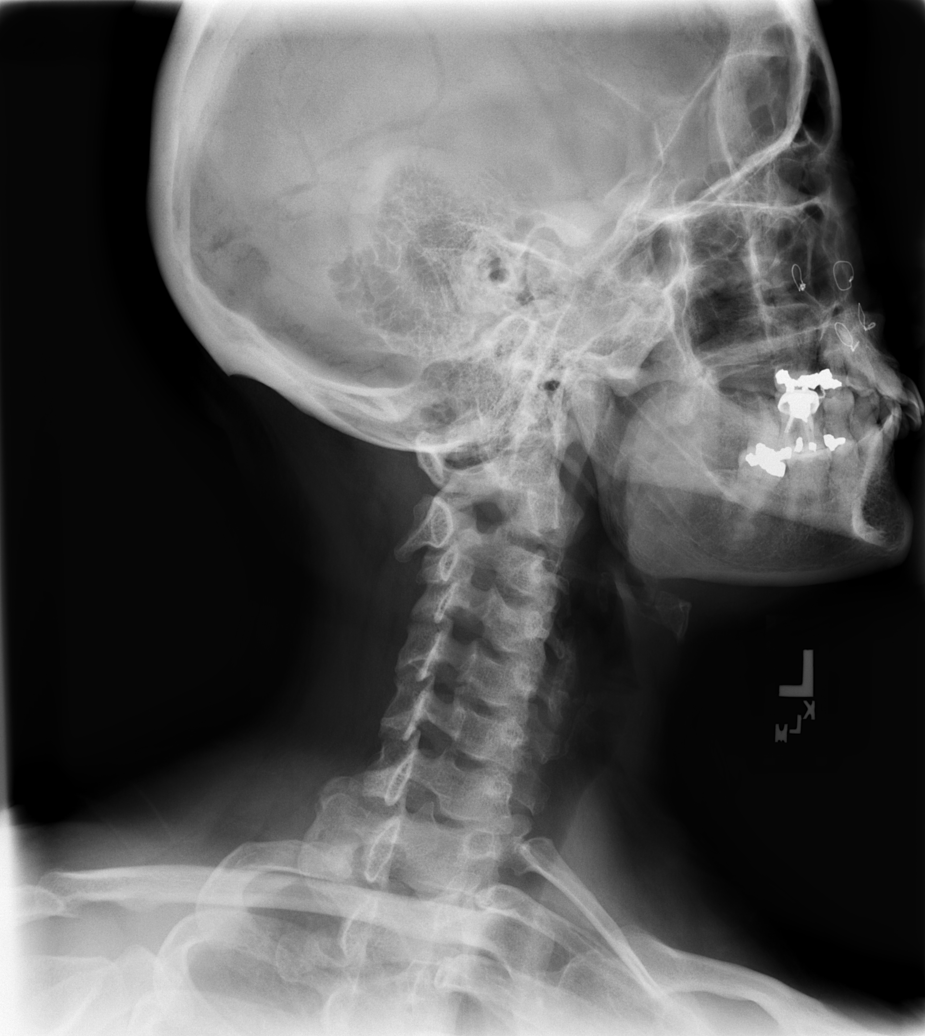

[w c-spine oblique (2 of 2)]
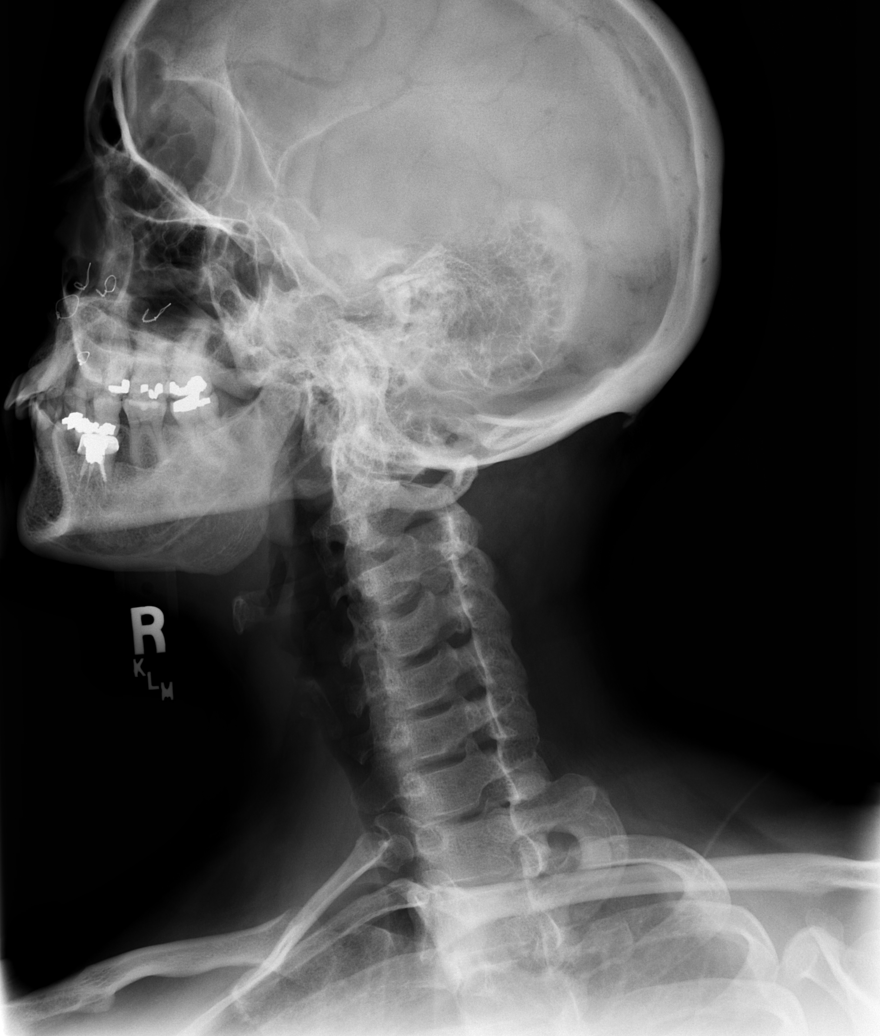

[w c-spine a.p. *]
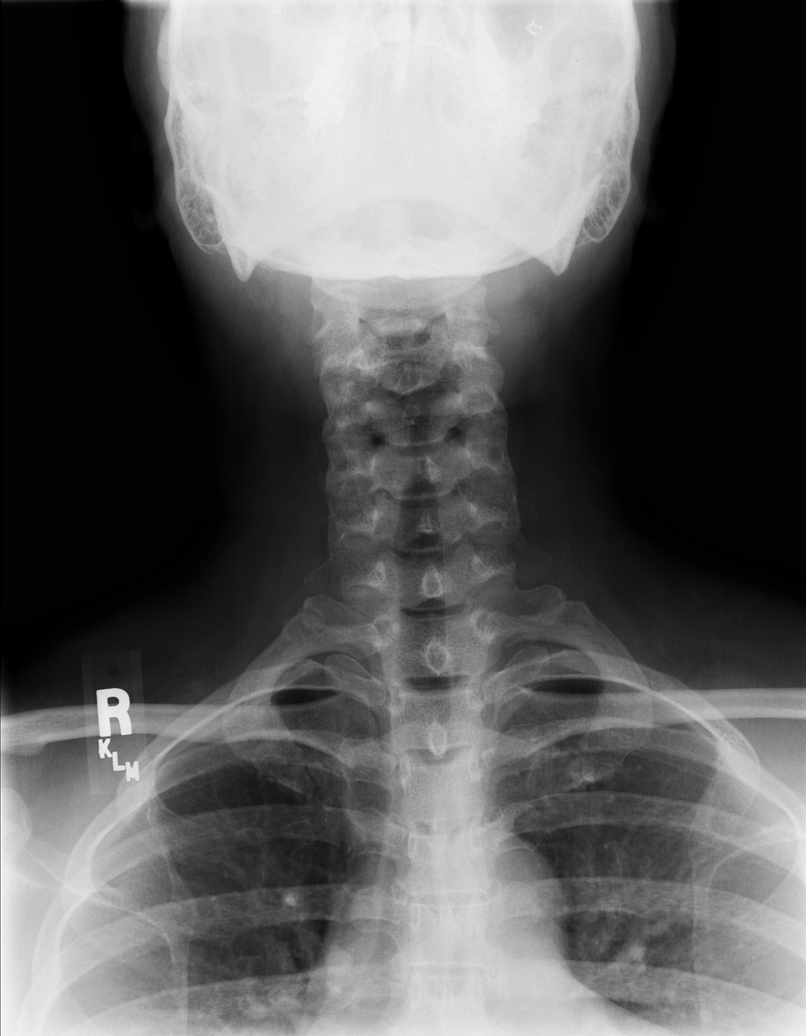

[w c-spine odontoid *]
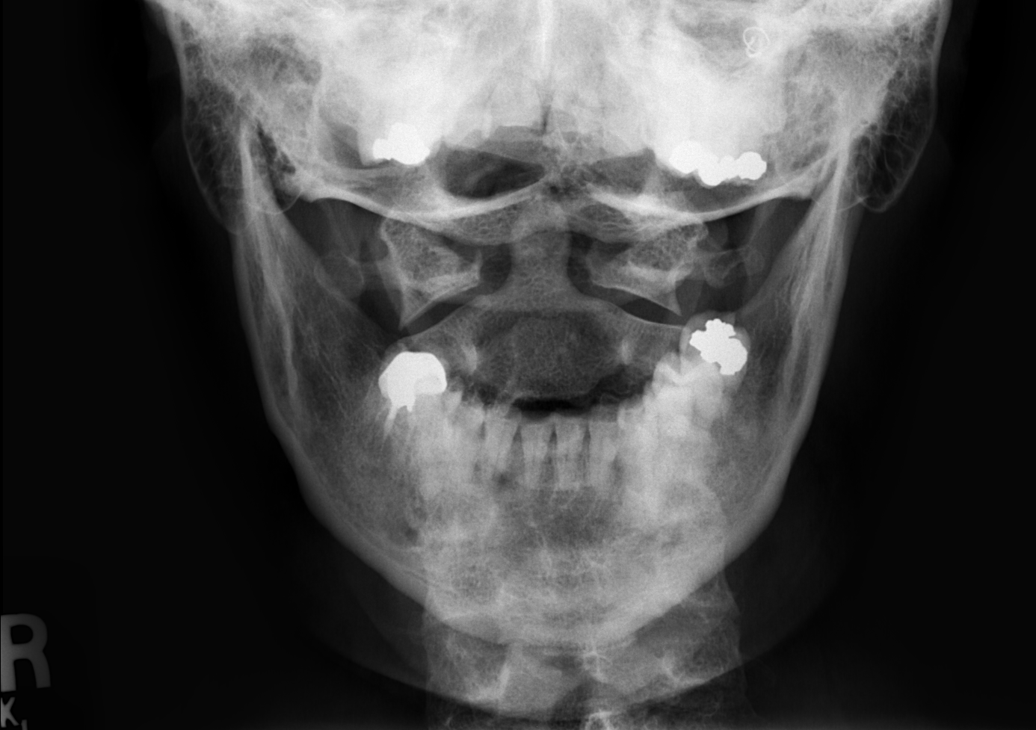

[5 of 5 positions shown; findings below may reference images not displayed]

IMPRESSION: 1. Early degenerative disc disease C4-C7, largely stable compared to previous
study.

## 2007-04-20 ENCOUNTER — Other Ambulatory Visit: Admission: RE | Admit: 2007-04-20 | Discharge: 2007-04-20 | Payer: Self-pay | Admitting: Gynecology

## 2008-01-10 ENCOUNTER — Encounter: Admission: RE | Admit: 2008-01-10 | Discharge: 2008-01-10 | Payer: Self-pay | Admitting: Internal Medicine

## 2008-01-10 IMAGING — CR DG KNEE 1-2V*R*
2 series · 2 of 2 positions shown · non-contrast
Comparison: None

CLINICAL DATA: Right knee pain and popping.  No specific injury.

RIGHT KNEE - 1-2 VIEW

[view not recorded (1 of 2)]
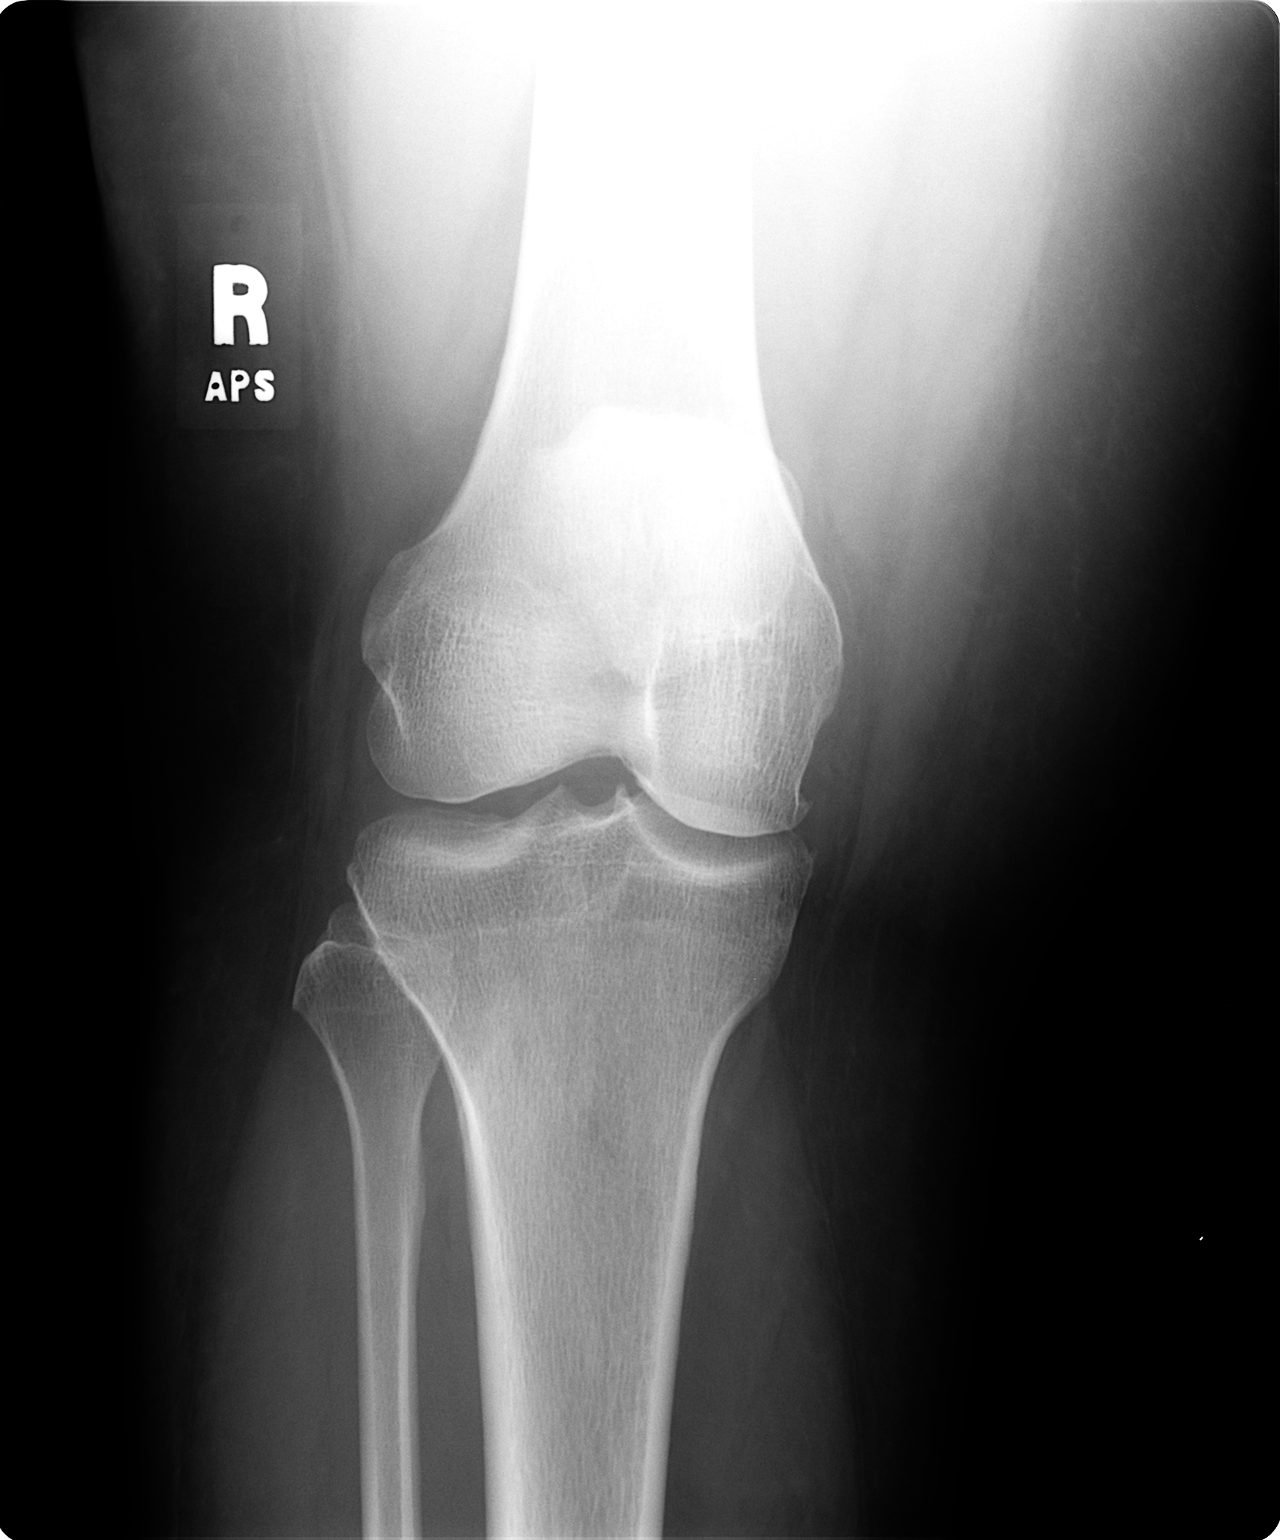

[view not recorded (2 of 2)]
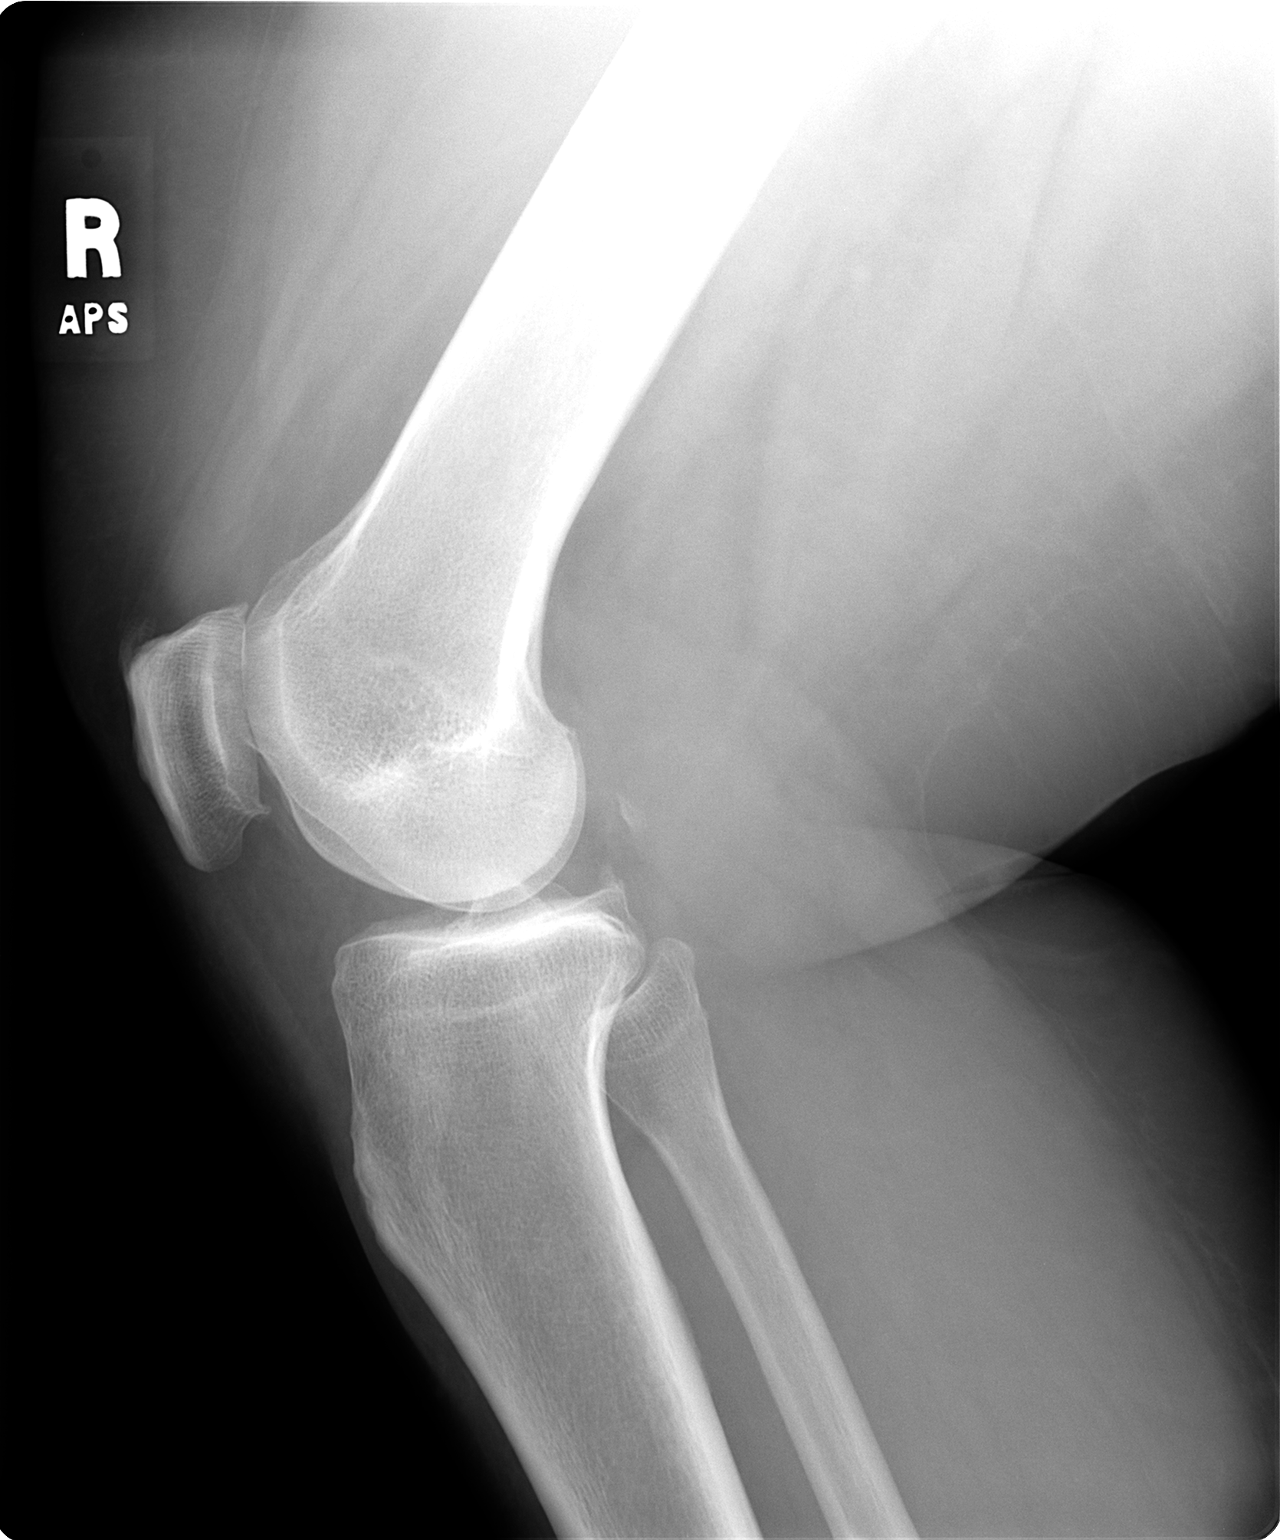

[2 of 2 positions shown; findings below may reference images not displayed]

FINDINGS: Slight patellofemoral and medial compartment
osteoarthritic changes are seen.  No significant joint effusion
noted.  No other osseous or articular abnormalities seen.
IMPRESSION: 1.  Slight patellofemoral and medial compartment osteoarthritis
findings.
2.  Otherwise, no acute abnormality.

## 2008-01-10 IMAGING — CR DG FOOT COMPLETE 3+V*R*
3 series · 3 of 3 positions shown · non-contrast
Comparison: [DATE]

CLINICAL DATA: Foot pain.  No known injury.

RIGHT FOOT COMPLETE - 3+ VIEW

[view not recorded (1 of 3)]
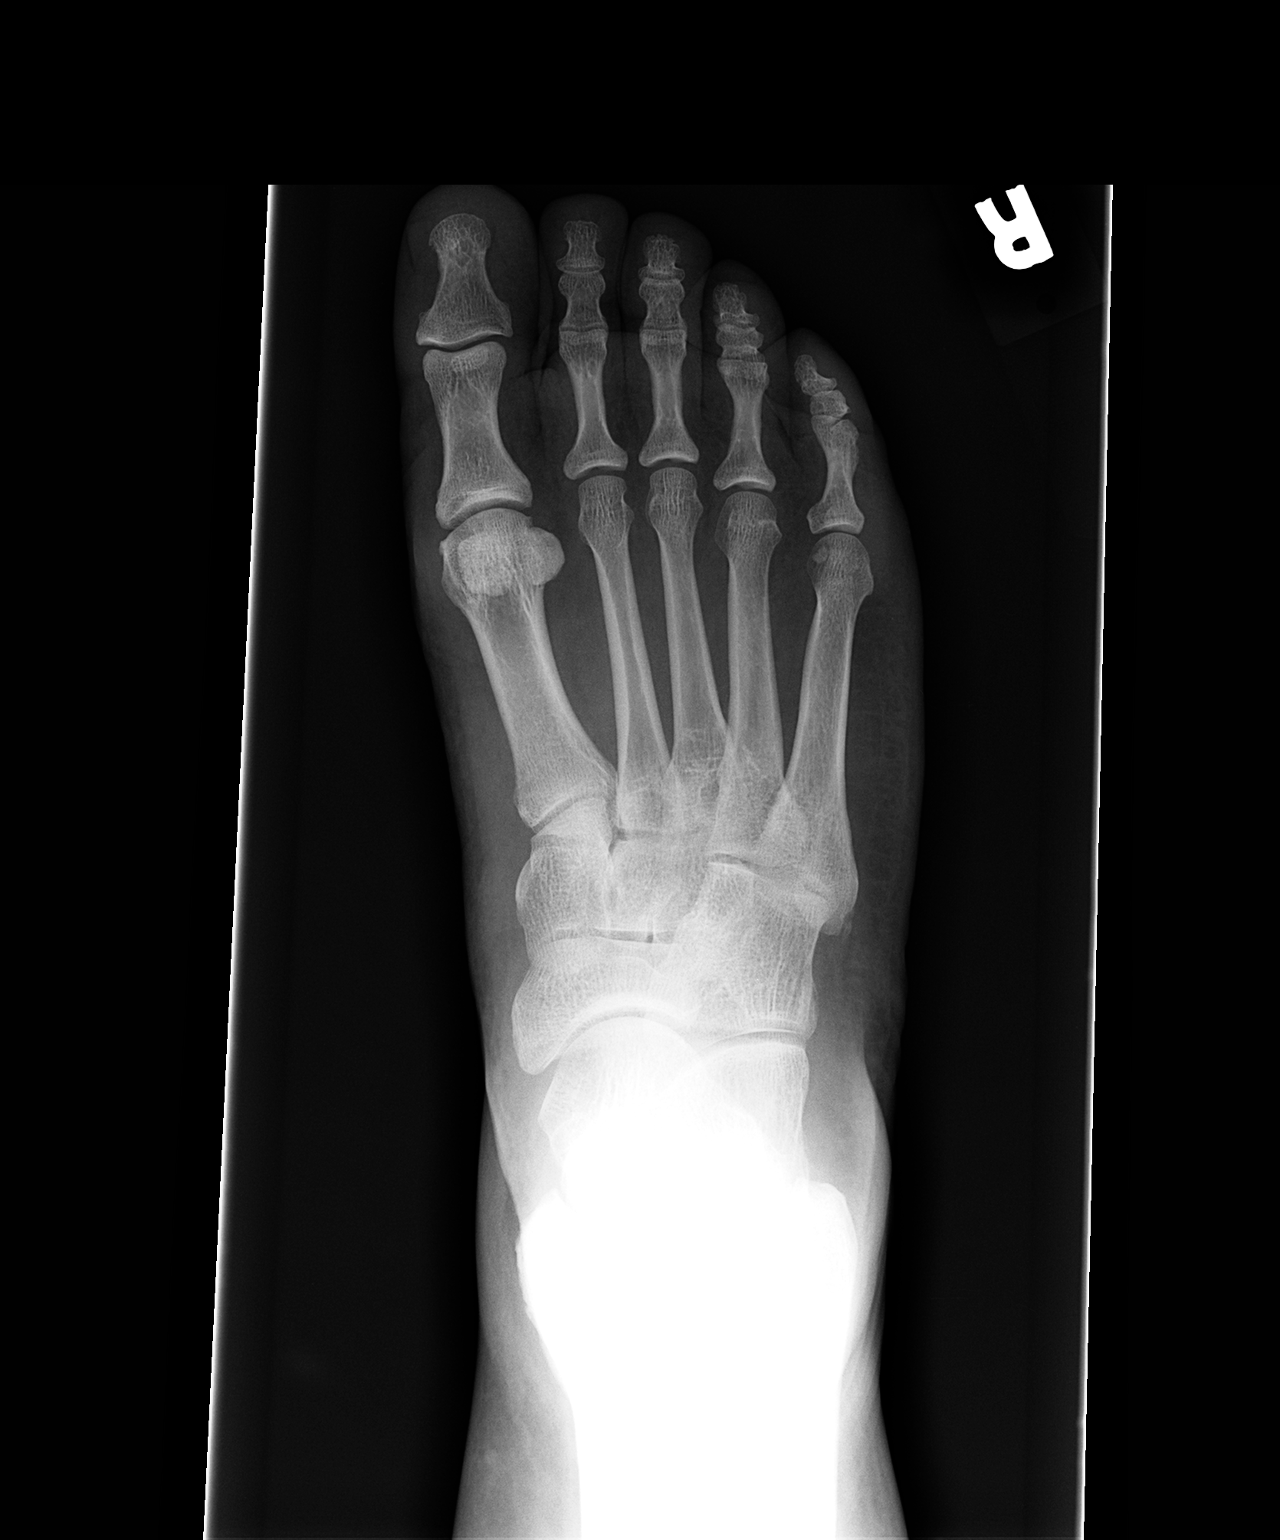

[view not recorded (2 of 3)]
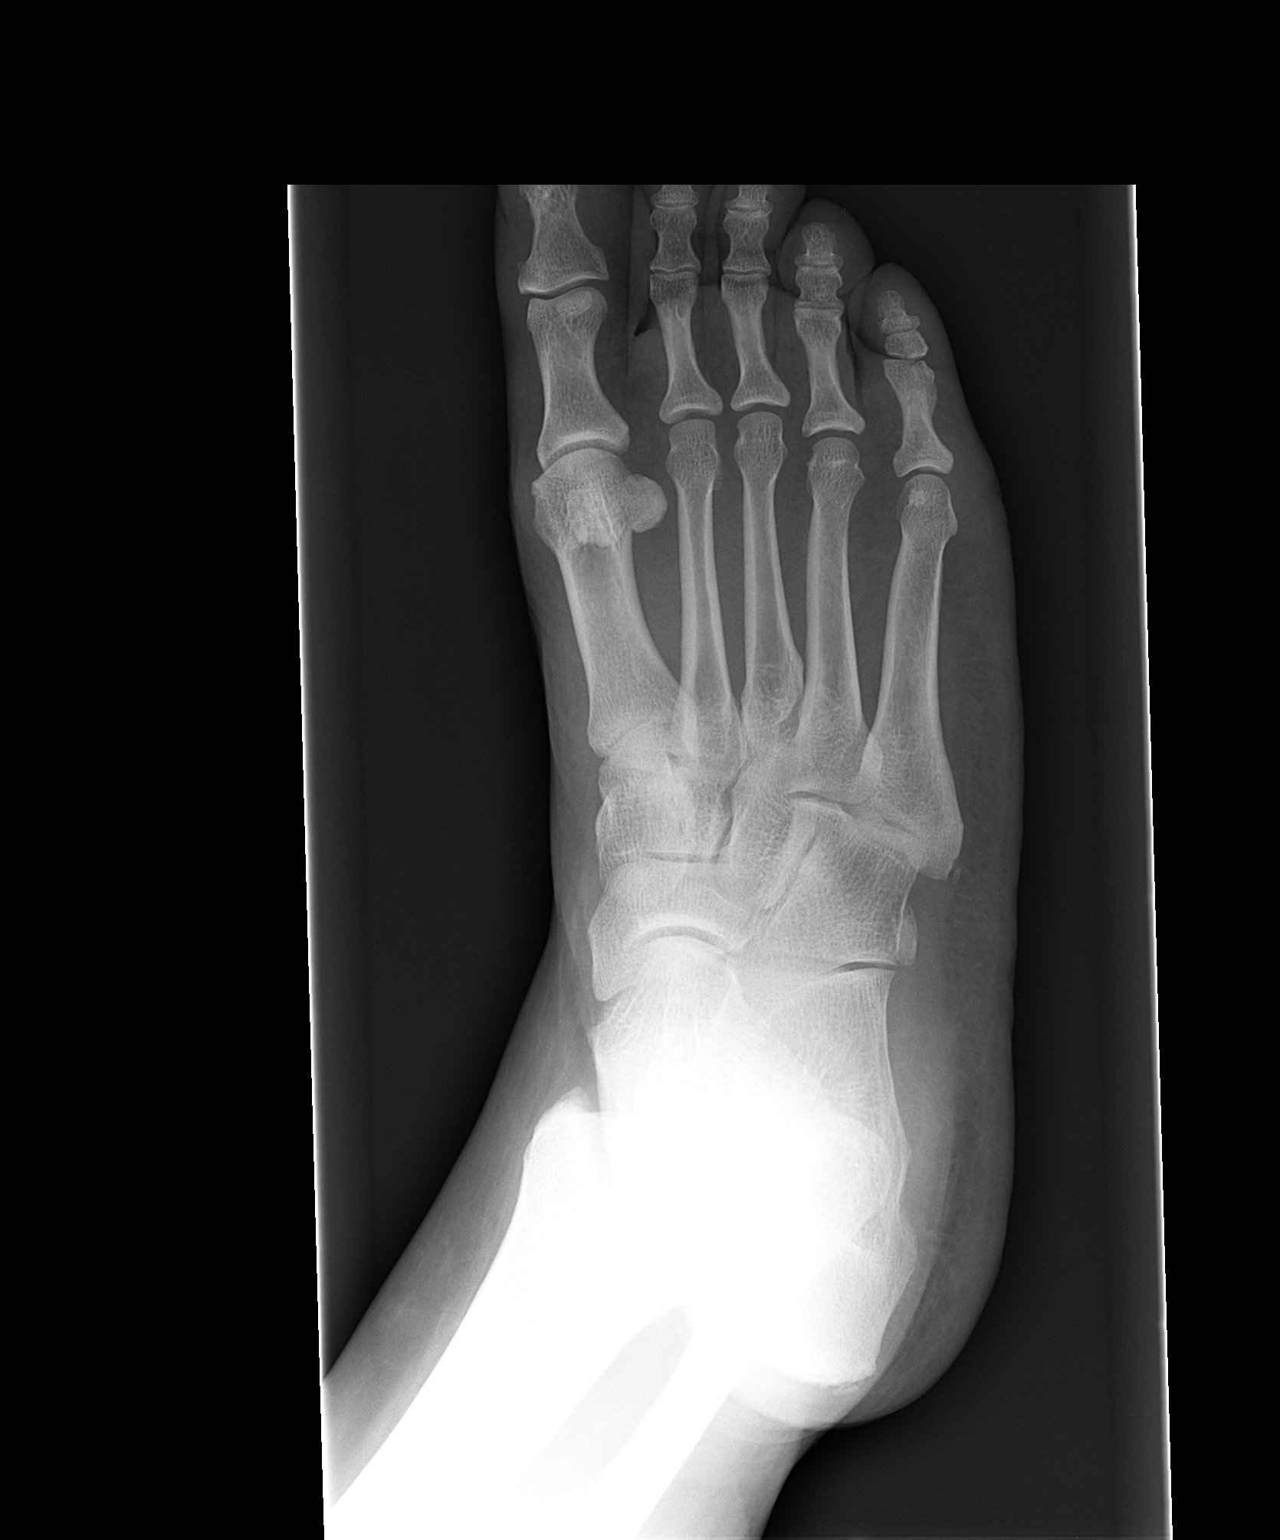

[view not recorded (3 of 3)]
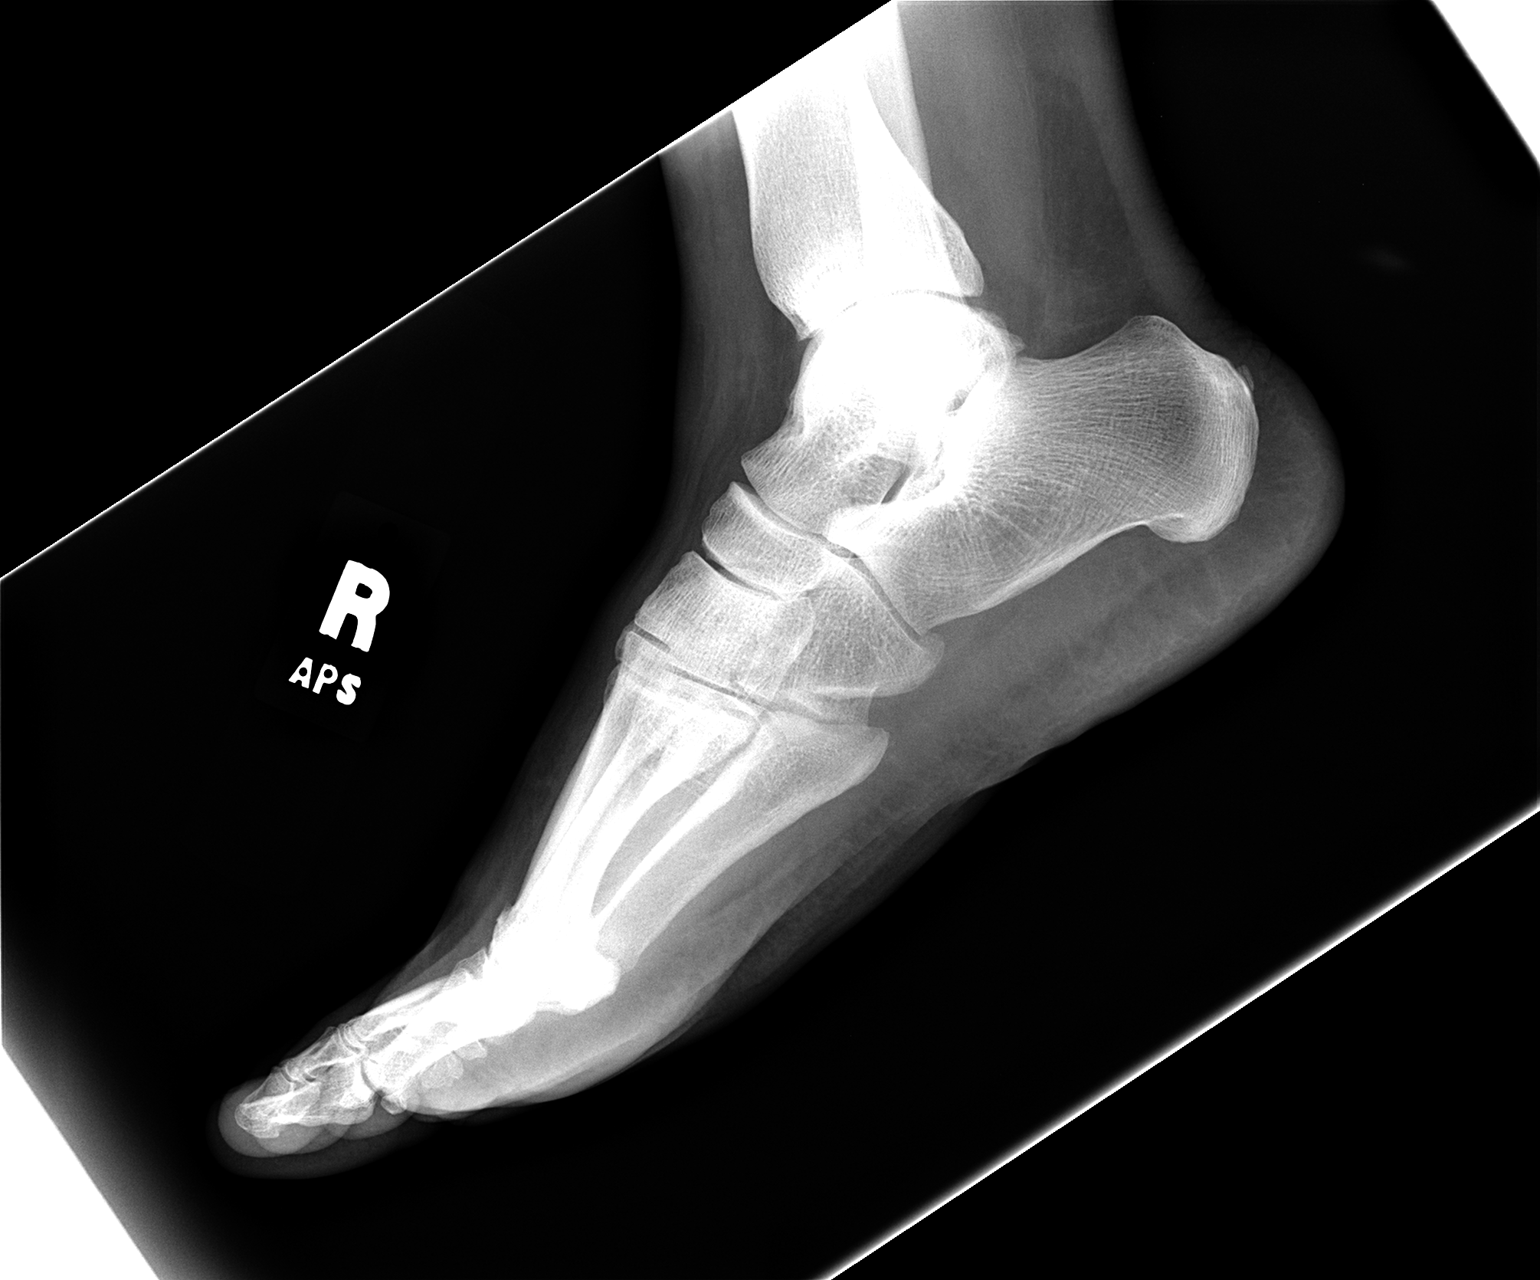

[3 of 3 positions shown; findings below may reference images not displayed]

FINDINGS: Mineralization and alignment appear normal.  There is no
evidence of acute fracture or dislocation.  Old healed fracture of
the fifth proximal phalanx is noted.  Minimal degenerative changes
are noted at the first metatarsal phalangeal joint.  A small dorsal
calcaneal spur and small os peroneii are noted.
IMPRESSION: No acute findings.

## 2008-03-28 ENCOUNTER — Ambulatory Visit: Payer: Self-pay | Admitting: Gynecology

## 2008-03-28 ENCOUNTER — Other Ambulatory Visit: Admission: RE | Admit: 2008-03-28 | Discharge: 2008-03-28 | Payer: Self-pay | Admitting: Gynecology

## 2008-03-28 ENCOUNTER — Encounter: Payer: Self-pay | Admitting: Gynecology

## 2008-08-06 ENCOUNTER — Encounter: Admission: RE | Admit: 2008-08-06 | Discharge: 2008-08-06 | Payer: Self-pay | Admitting: Internal Medicine

## 2008-08-06 IMAGING — CR DG CHEST 2V
2 series · 2 of 2 positions shown · non-contrast
Comparison: Chest x-ray of [DATE]

CLINICAL DATA: Left flank pain

CHEST - 2 VIEW

[view not recorded (1 of 2)]
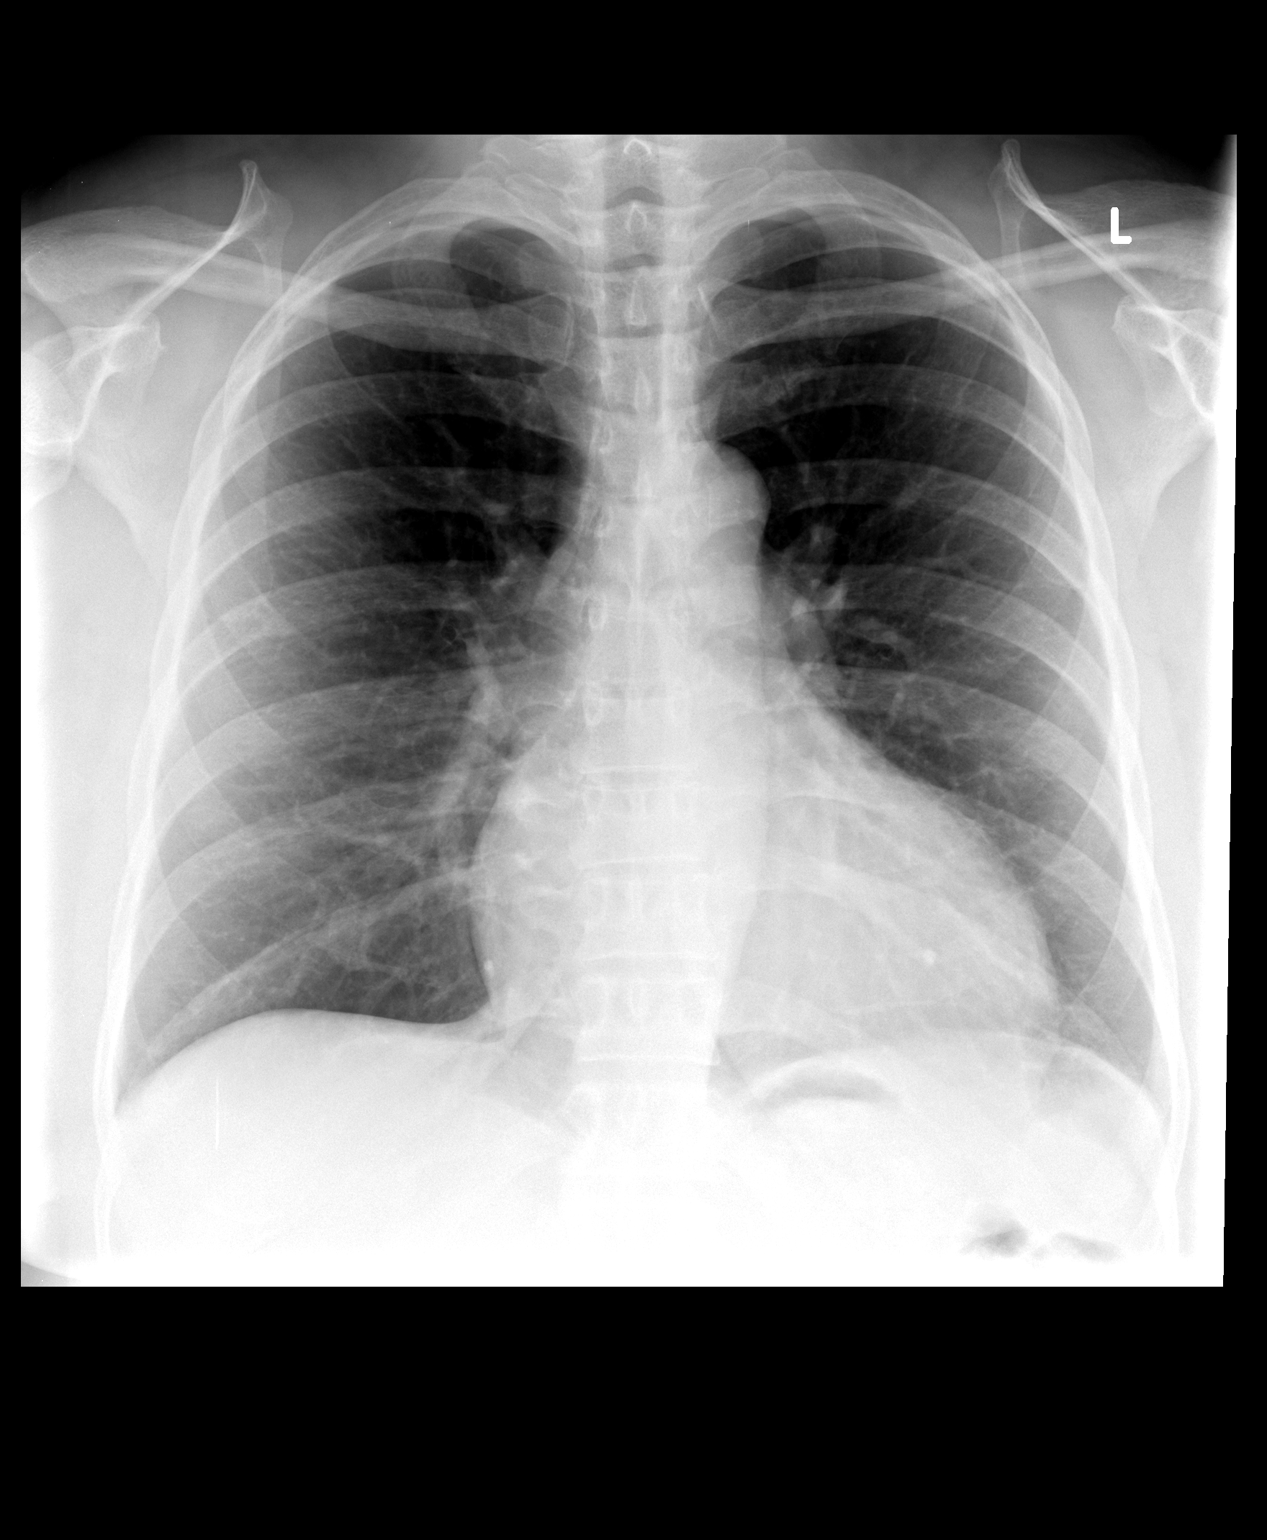

[view not recorded (2 of 2)]
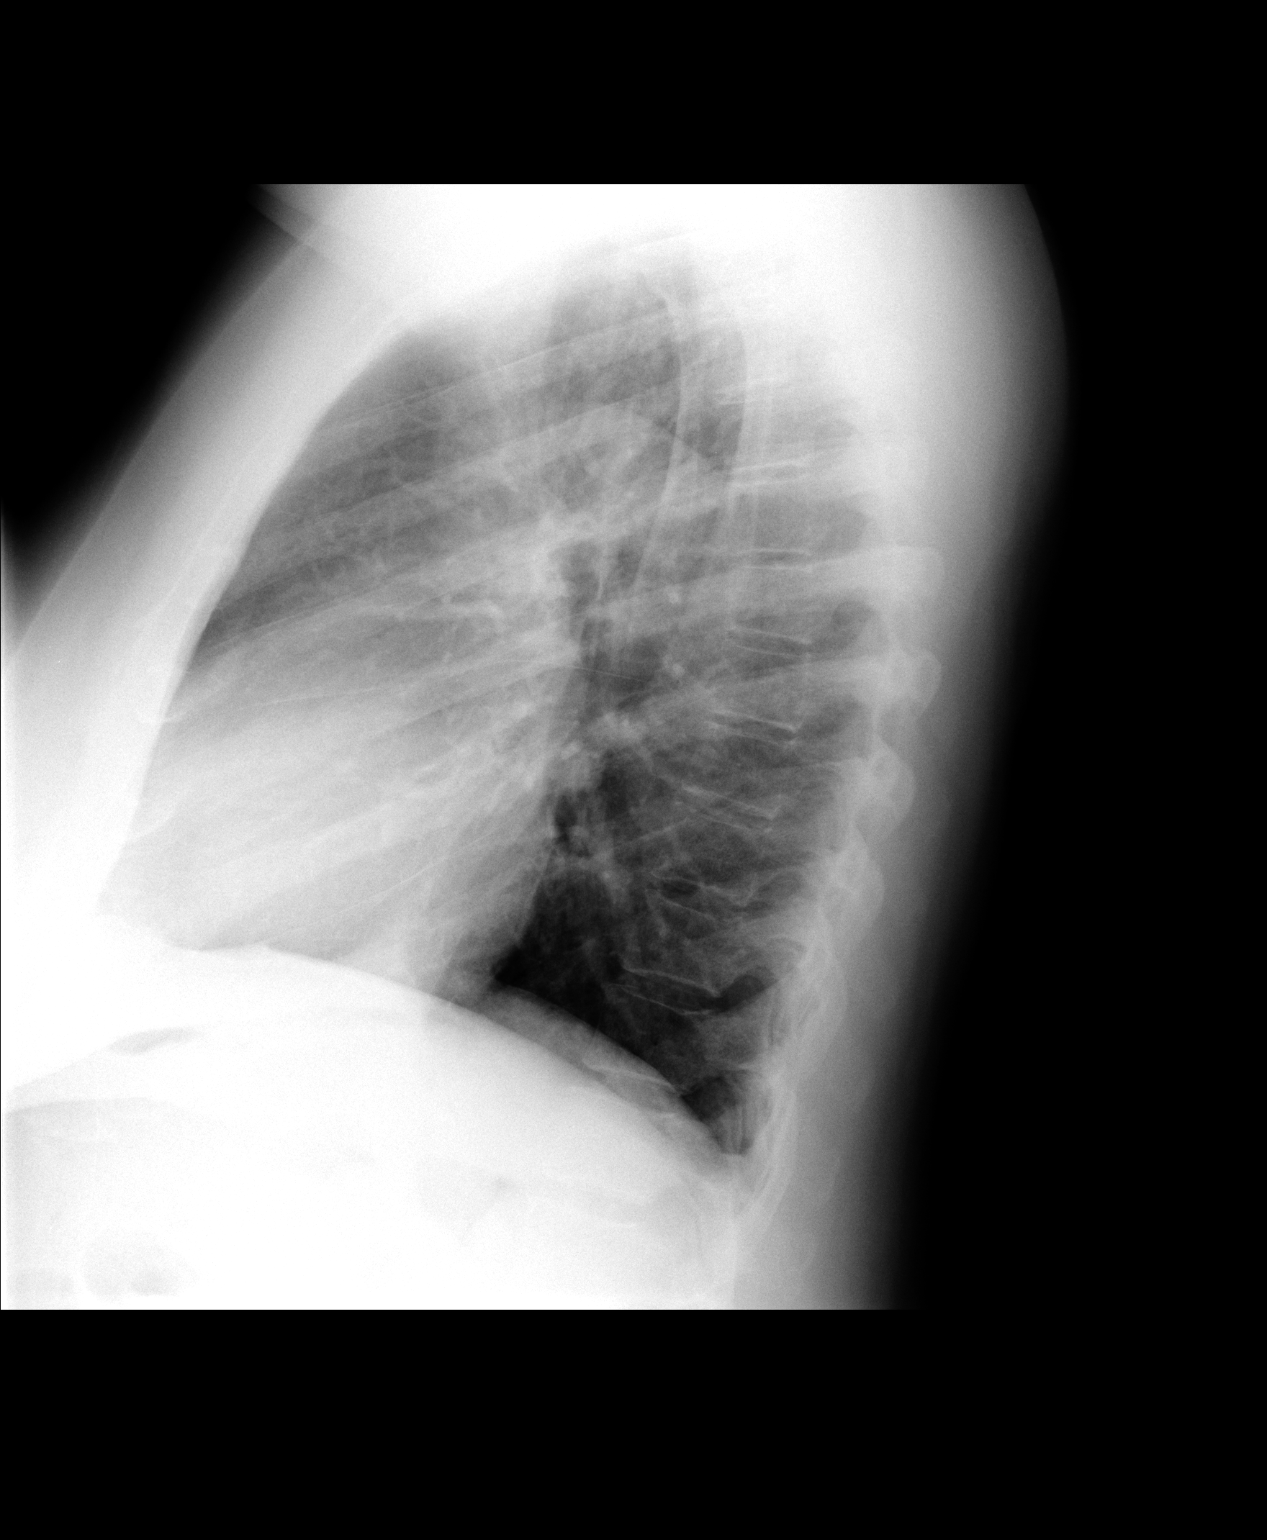

[2 of 2 positions shown; findings below may reference images not displayed]

FINDINGS: The lungs are clear. The heart is mildly enlarged and
stable. No bony abnormality is seen.
IMPRESSION: Stable chest x-ray.  No change in mild cardiomegaly.

## 2009-04-30 ENCOUNTER — Ambulatory Visit: Payer: Self-pay | Admitting: Gynecology

## 2009-04-30 ENCOUNTER — Other Ambulatory Visit: Admission: RE | Admit: 2009-04-30 | Discharge: 2009-04-30 | Payer: Self-pay | Admitting: Gynecology

## 2009-11-06 ENCOUNTER — Ambulatory Visit: Payer: Self-pay | Admitting: Gynecology

## 2010-01-10 ENCOUNTER — Encounter: Admission: RE | Admit: 2010-01-10 | Discharge: 2010-01-10 | Payer: Self-pay | Admitting: Internal Medicine

## 2010-01-10 IMAGING — CR DG SACRUM/COCCYX 2+V
3 series · 3 of 3 positions shown · non-contrast
Comparison: None available.

CLINICAL DATA: Sacral pain.  No known injury.

SACRUM AND COCCYX - 2+ VIEW

[t coccyx a.p.]
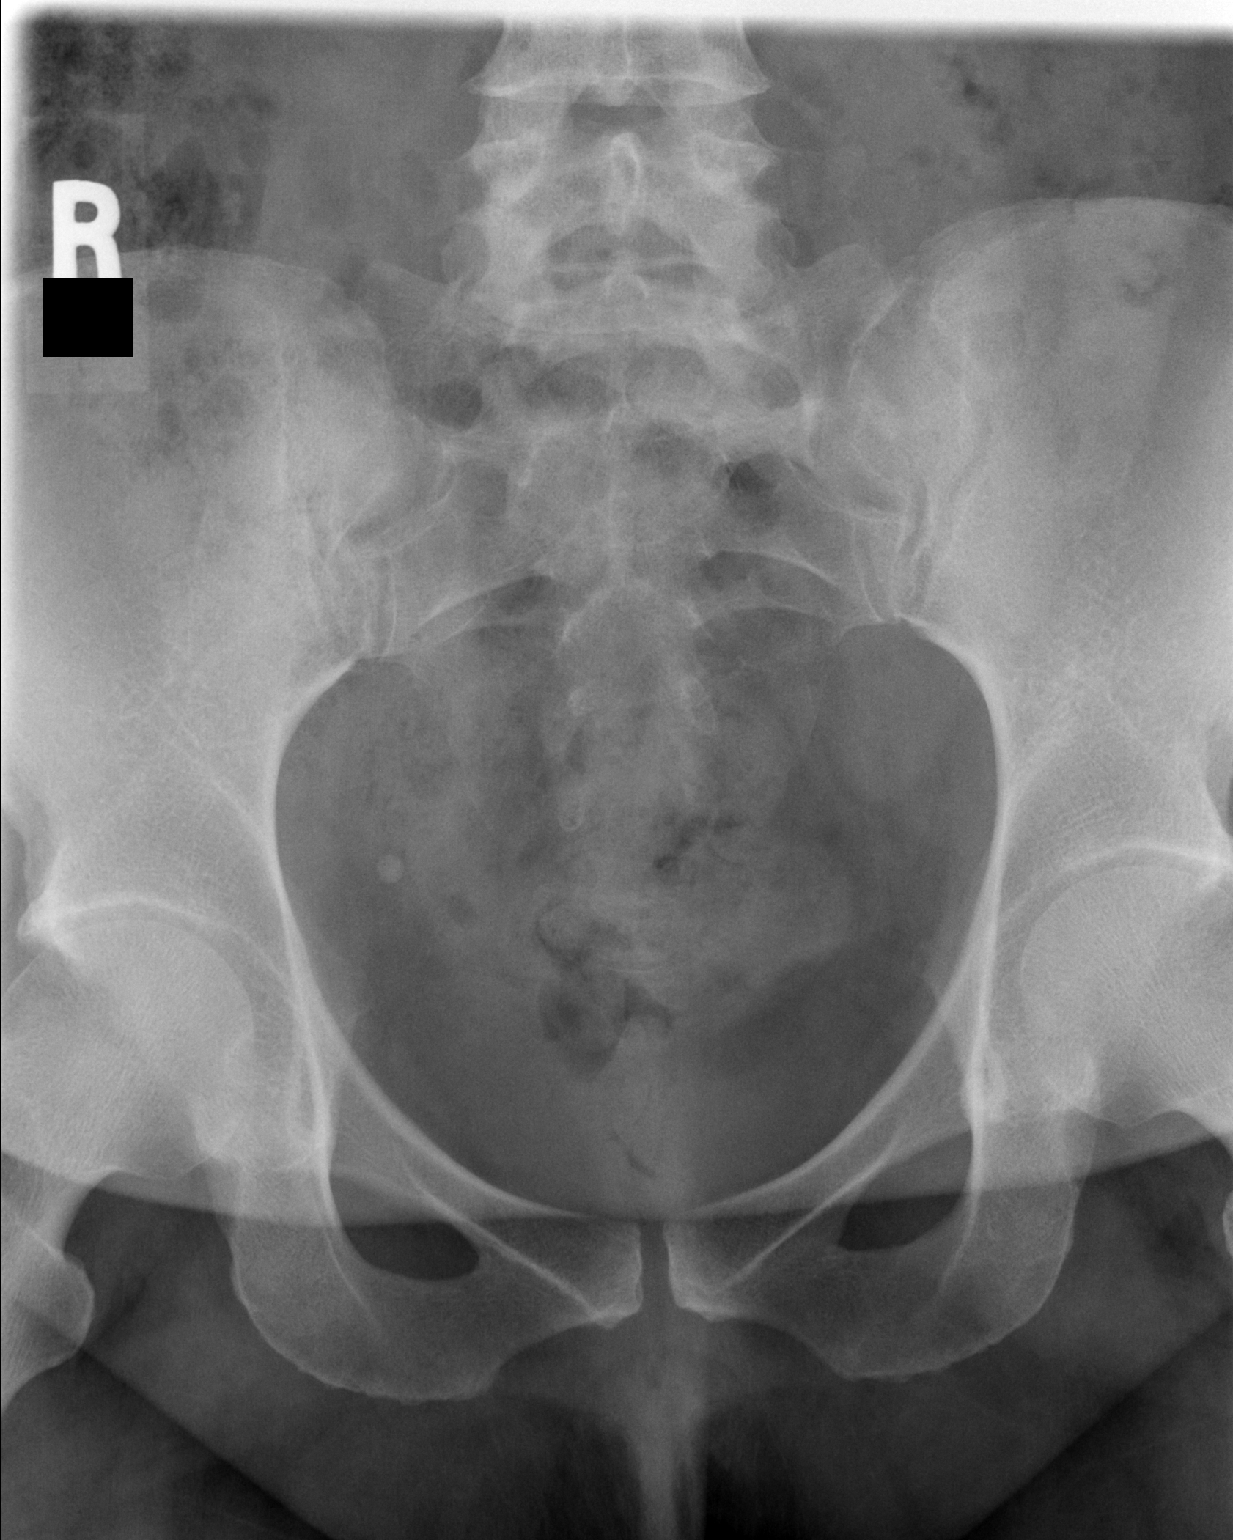

[t sacrum a.p.]
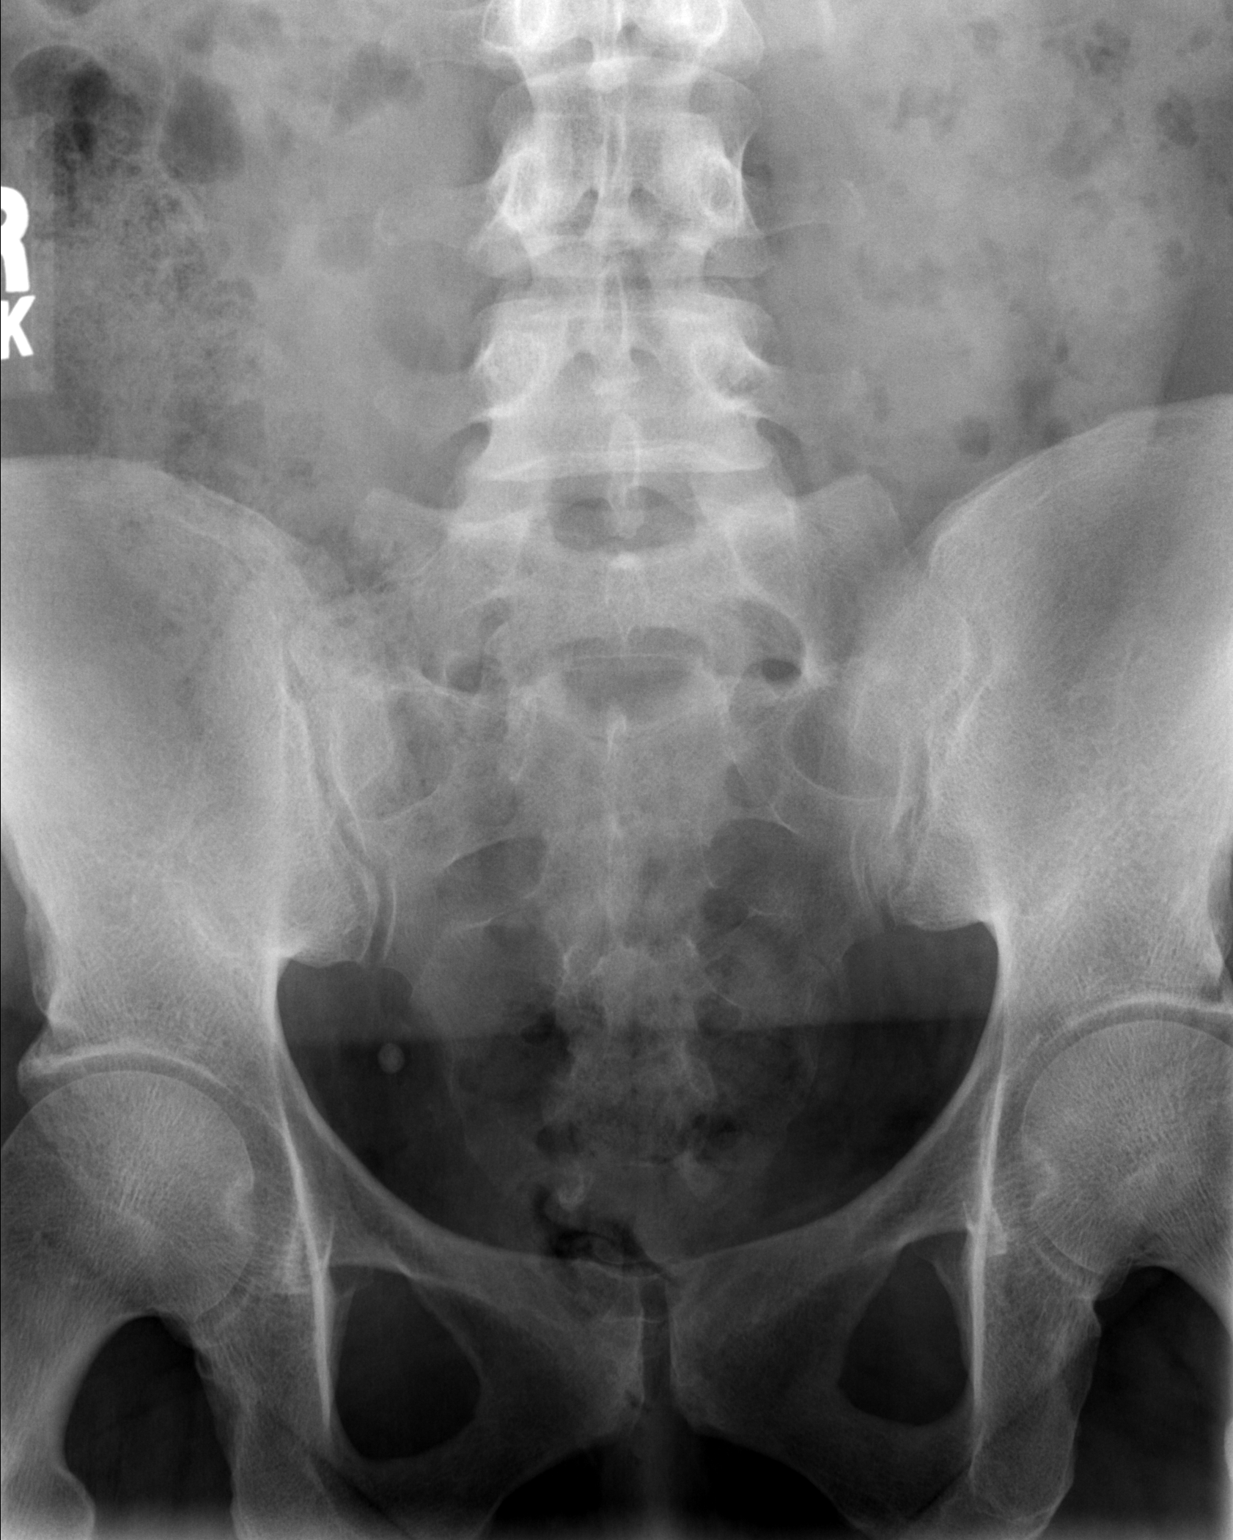

[t sacrum lat]
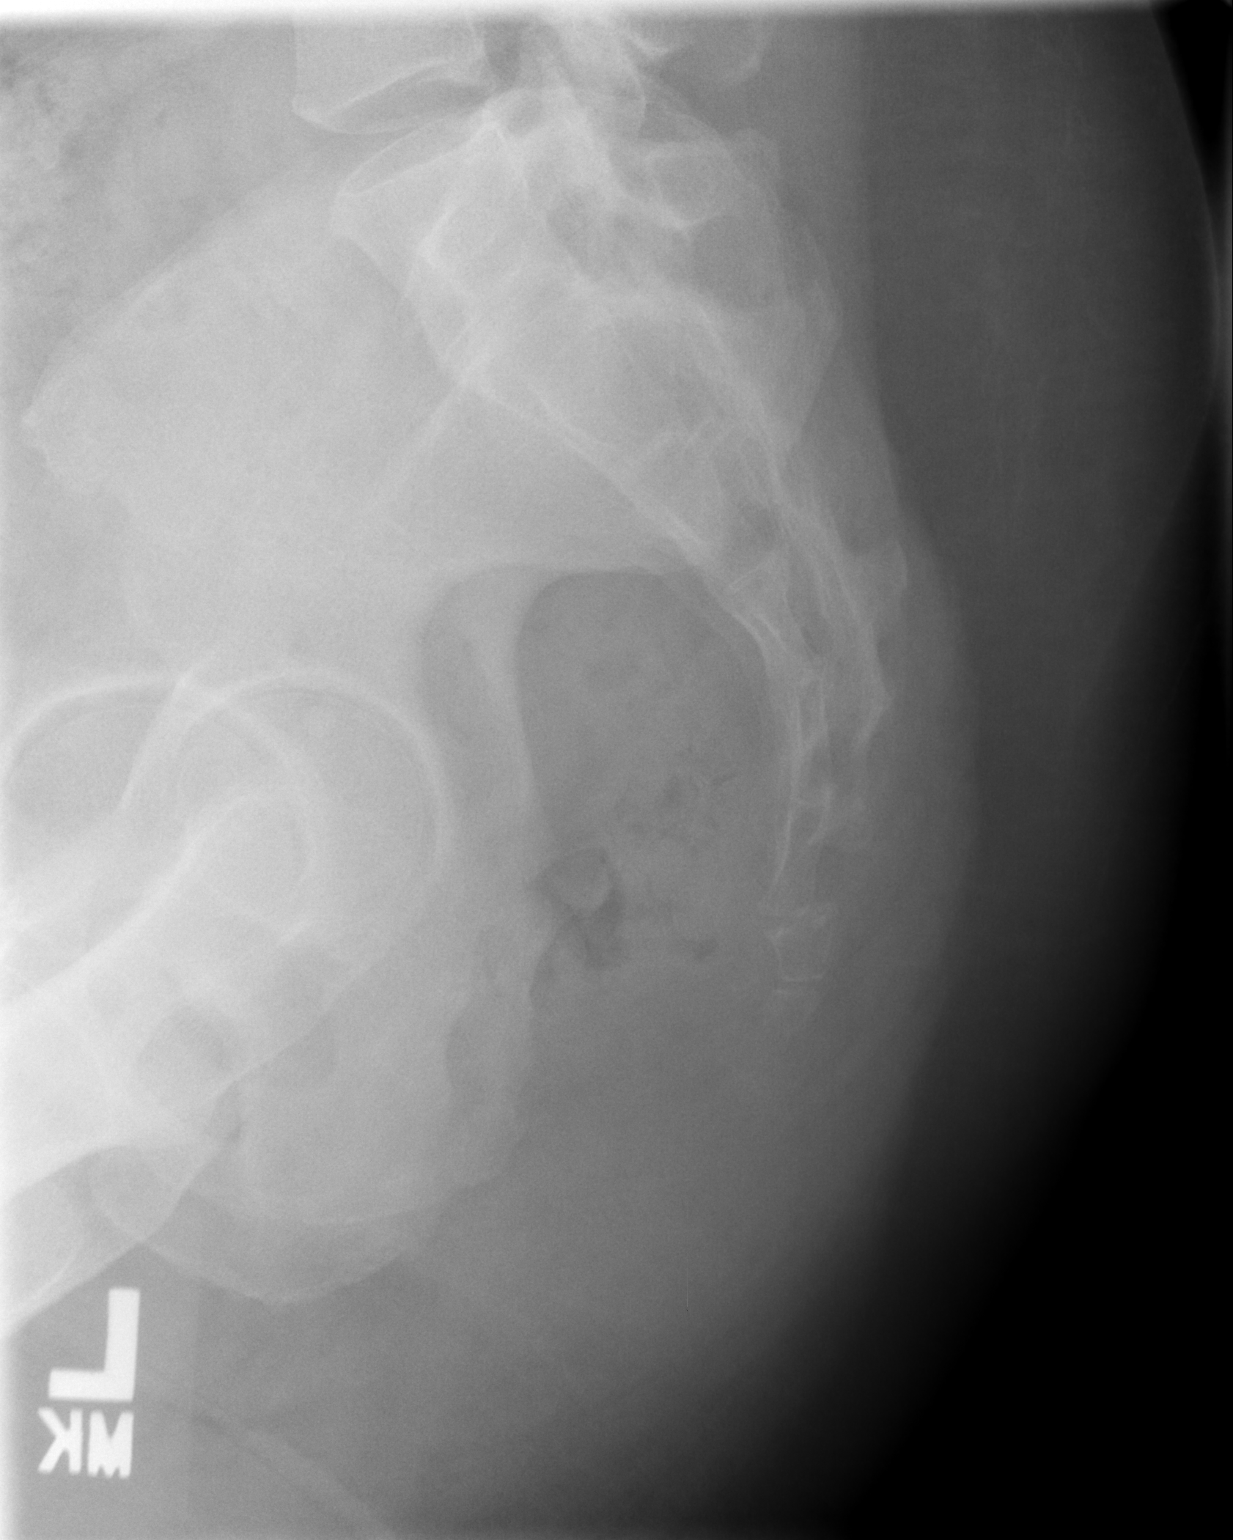

[3 of 3 positions shown; findings below may reference images not displayed]

FINDINGS: There is no evidence of fracture or other focal bone
lesions. The SI joints are normal and there is no erosion or
degenerative change.
IMPRESSION: Negative.

## 2010-05-01 ENCOUNTER — Other Ambulatory Visit
Admission: RE | Admit: 2010-05-01 | Discharge: 2010-05-01 | Payer: Self-pay | Source: Home / Self Care | Admitting: Gynecology

## 2010-05-01 ENCOUNTER — Ambulatory Visit: Payer: Self-pay | Admitting: Gynecology

## 2010-05-20 ENCOUNTER — Encounter
Admission: RE | Admit: 2010-05-20 | Discharge: 2010-05-20 | Payer: Self-pay | Source: Home / Self Care | Attending: Internal Medicine | Admitting: Internal Medicine

## 2010-05-20 IMAGING — US US ABDOMEN COMPLETE
1 series · 14 of 25 positions shown · non-contrast
Comparison: None.

CLINICAL DATA: Right upper quadrant pain

ABDOMINAL ULTRASOUND COMPLETE

[Series 1: us abdomen complete · 0.28mm/px · 14 of 78 slices shown]
[im 1/78]
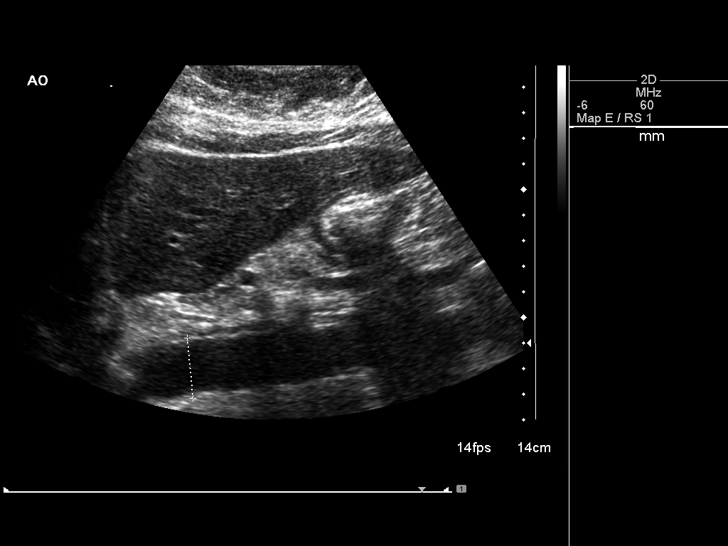
[im 7/78]
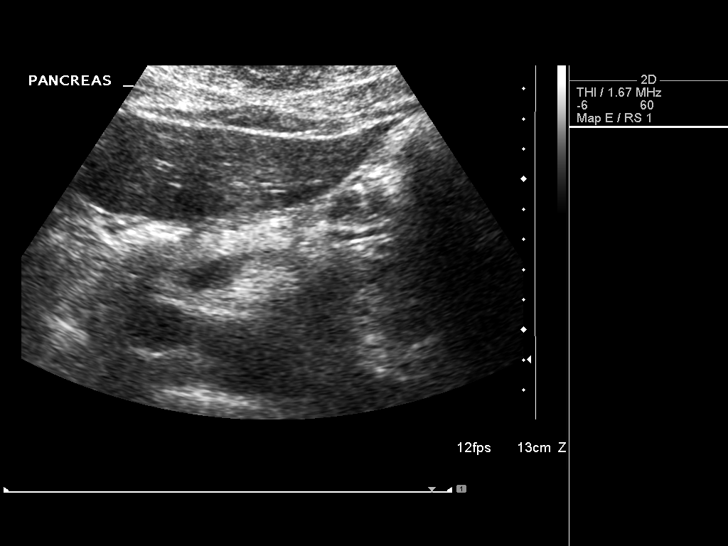
[im 13/78]
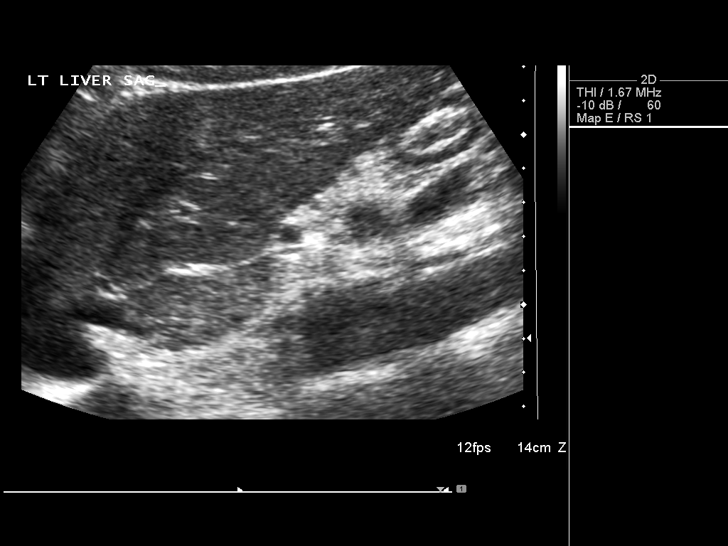
[im 20/78]
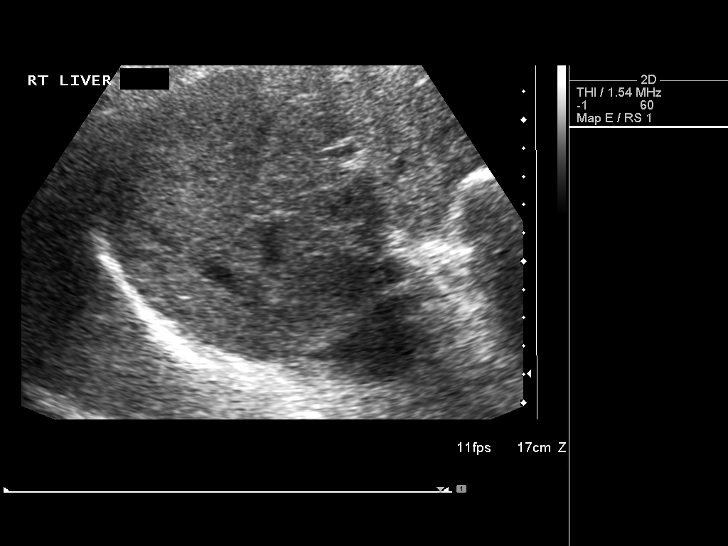
[im 26/78]
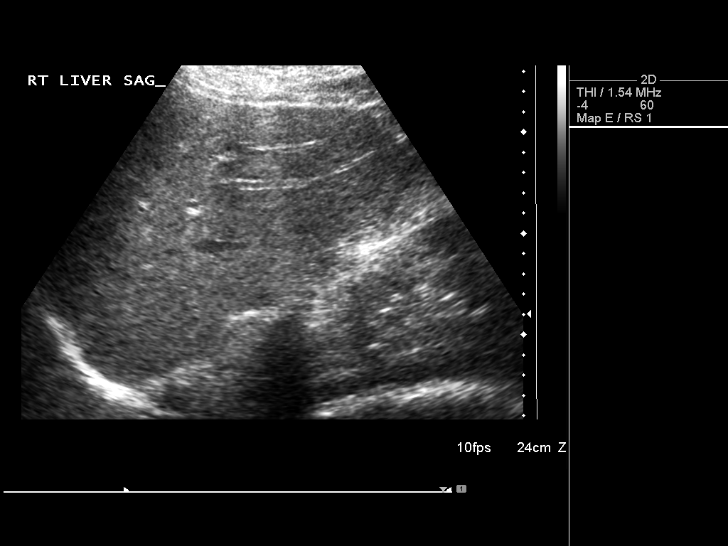
[im 29/78]
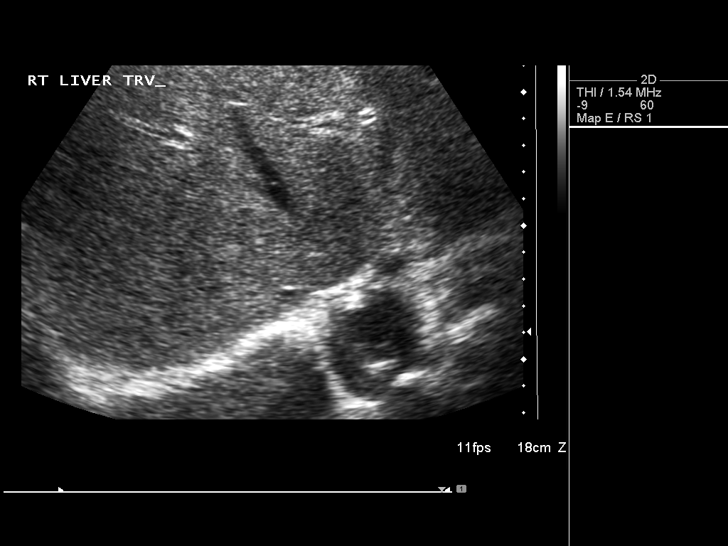
[im 36/78]
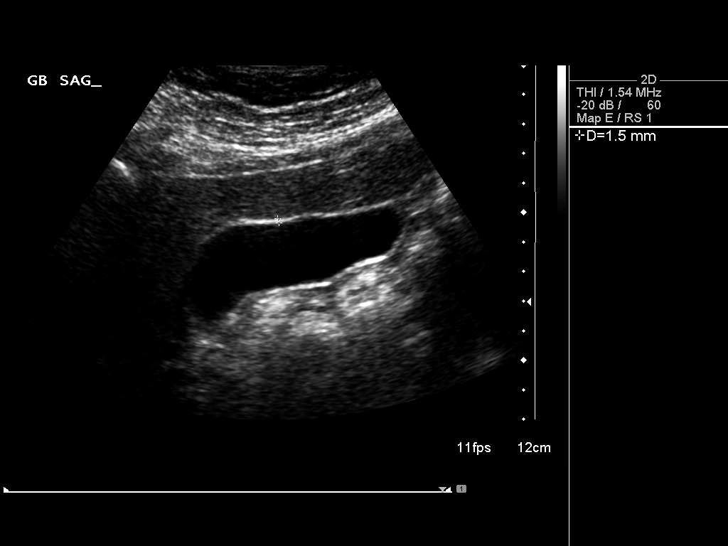
[im 42/78]
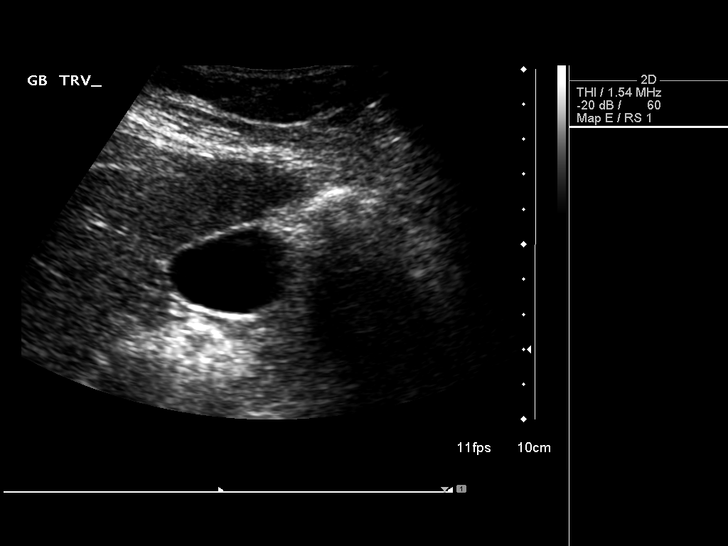
[im 49/78]
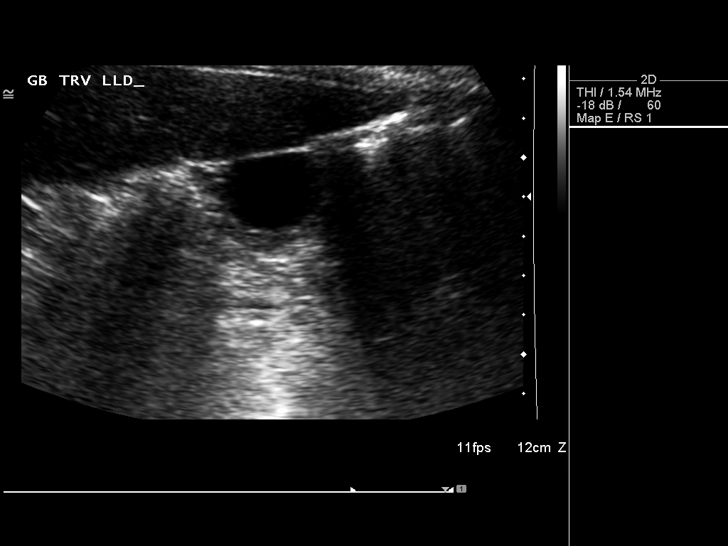
[im 52/78]
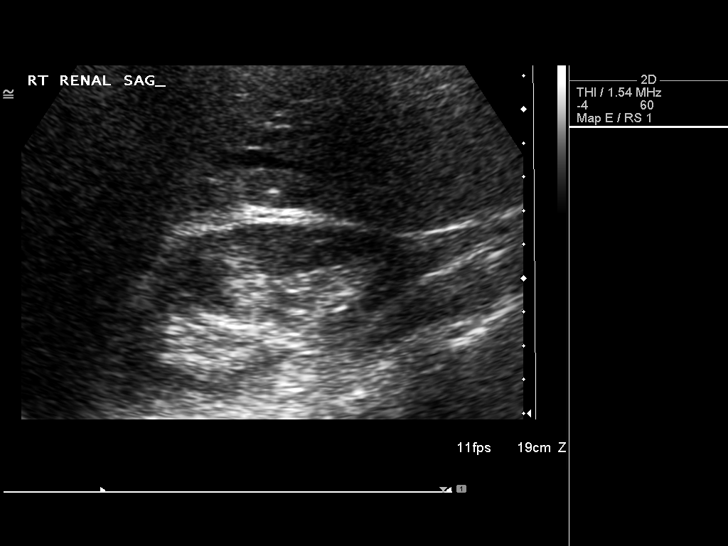
[im 58/78]
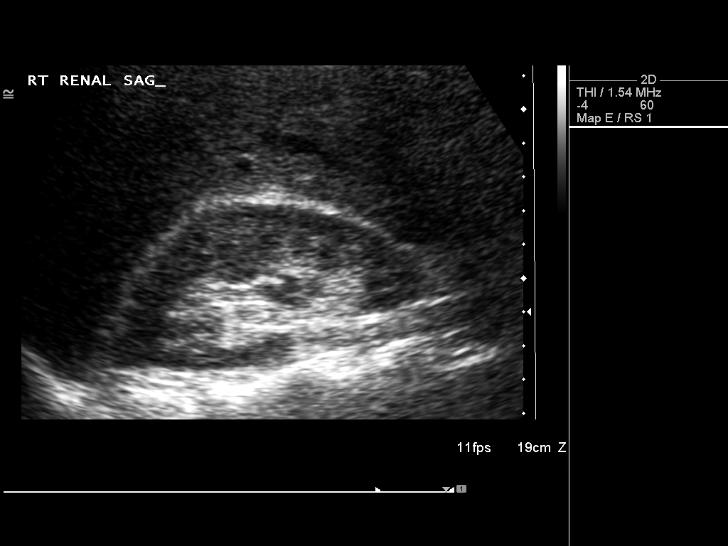
[im 65/78]
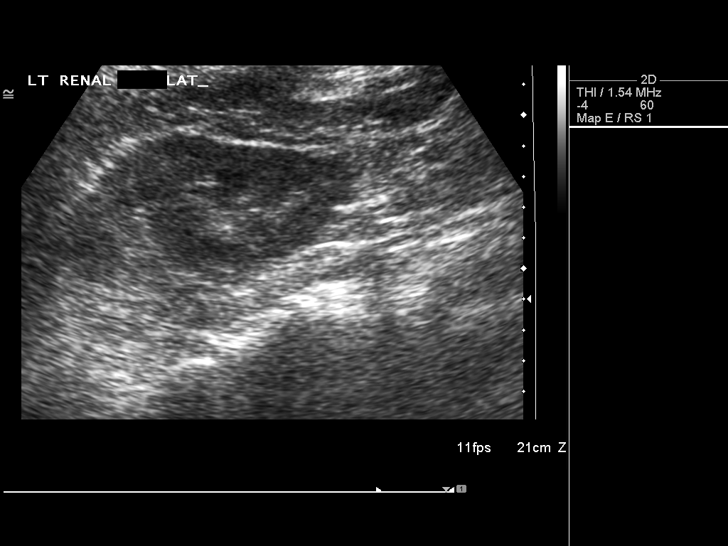
[im 71/78]
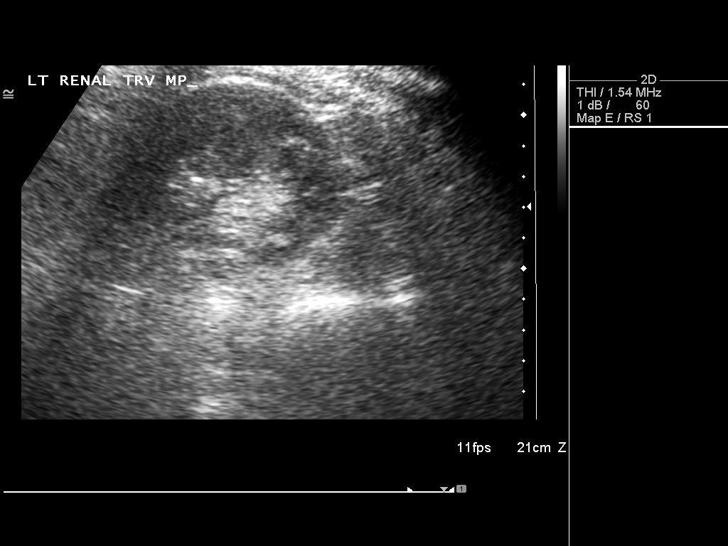
[im 78/78]
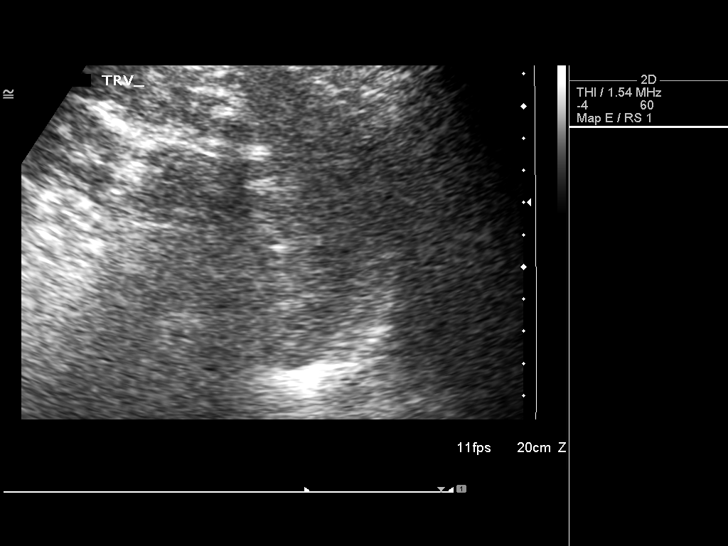

[14 of 25 positions shown; findings below may reference images not displayed]

FINDINGS: Gallbladder:  No gallstones, gallbladder wall thickening, or
pericholecystic fluid.

Common Bile Duct:  Within normal limits in caliber.

Liver: No focal mass lesion identified.  Within normal limits in
parenchymal echogenicity.

IVC:  Appears normal.

Pancreas:  No abnormality identified.

Spleen:  Within normal limits in size and echotexture.

Right kidney:  Normal in size and parenchymal echogenicity.  No
evidence of mass or hydronephrosis.

Left kidney:  Normal in size and parenchymal echogenicity.  No
evidence of mass or hydronephrosis.

Abdominal Aorta:  No aneurysm identified.
IMPRESSION: Negative abdominal ultrasound.

## 2010-06-08 ENCOUNTER — Encounter: Payer: Self-pay | Admitting: Gynecology

## 2010-10-03 NOTE — Discharge Summary (Signed)
NAME:  Bianca Medina, Bianca Medina                          ACCOUNT NO.:  [id number]   MEDICAL RECORD NO.:  [id number]                   PATIENT TYPE:  INP   LOCATION:  9308                                 FACILITY:  [facility]   PHYSICIAN:  Juan H. Lily Peer, M.D.             DATE OF BIRTH:  06/20/1950   DATE OF ADMISSION:  11/30/2002  DATE OF DISCHARGE:  12/03/2002                                 DISCHARGE SUMMARY   DISCHARGE DIAGNOSES PREOPERATIVELY:  1. Leiomyomata uteri.  2. Menometrorrhagia.  3. Iron deficiency anemia.  4. Pelvic pain.   POSTOPERATIVE DIAGNOSES:  1. Leiomyomata uteri.  2. Menometrorrhagia.  3. Iron deficiency anemia.  4. Pelvic pain.  5. Hemorrhage status post examination under anesthesia.  6. Total abdominal hysterectomy and bilateral salpingo-oophorectomy.  7. Pelvic enterolysis.  8. Transfusion of two units of packed red blood cells intraoperatively.   HISTORY AND PHYSICAL:  This is a 60 years of age female, G3, P3, with a  history of laparoscopic tubal ligation, suffering from iron deficiency  anemia and leiomyomata uteri for several years.  She had been attempted on  several regimens to control her breathing including Megace.  She has also  been given iron injections q week for four weeks.  Ultrasound revealed a  uterus that measured 4.4 x 6.6 x 7.6 cm, prominent cavity with anterior  submucous myoma, intramural myoma, with calcifications that measured 30 x 28  mm.  She also had a submucosal myoma measuring 30 x 32 mm in the endometrial  cavity, and additionally an intramural myoma that measures 4 x 13 mm and 16  x 10 mm respectively, and a left intramural myoma measuring 38 x 32 mm with  calcification, as well.  She had had an endometrial biopsy which was benign,  and the patient desired definitive surgery.   HOSPITAL COURSE:  On November 30, 2002, the patient was admitted and underwent  an examination under anesthesia, total abdominal hysterectomy, bilateral  salpingo-oophorectomy, and pelvic ateliosis.  It was noted there was a 12-14  week size leiomyomata pelvic adhesion and normal ovaries.  There was noted  to be excessive bleeding during the surgery, and since the patient's  preoperative hemoglobin was 8.7, the patient intraoperatively was transfused  two units of packed red blood cells.  Postoperatively, the patient remained  afebrile, voiding, and was stable for discharge.  She was discharged to home  on December 03, 2002 in satisfactory condition, and given [facility] Gynecology  instructions.   CLINICAL FINDINGS AND LABORATORIES:  On November 29, 2002, hemoglobin was 8.7.  On November 30, 2002, hemoglobin was 9.3.  On December 01, 2002, hemoglobin was 8.8.   DISPOSITION:  The patient was discharged to home.    MEDICATIONS:  She was given a prescription for Tylox to take 1-2 q.4h.  p.r.n. pain, #40, and Climara 0.1 mg patch q week, #4, with 12 refills.   FOLLOW  UP:  She was to follow up at the office of Dr. Farrel Gobble for a  postoperative check in two weeks.     Susa Loffler, P.A.                    Juan H. Lily Peer, M.D.    TSG/MEDQ  D:  12/22/2002  T:  12/22/2002  Job:  [id number]

## 2010-10-03 NOTE — Discharge Summary (Signed)
[place]. [facility]  Patient:    Bianca Medina, Bianca Medina Visit Number: [id number] MRN: [id number]          Service Type: MED Location: [phone] Attending Physician:  Gwenyth Bender Dictated by:   Lind Guest. August Saucer, M.D. Admit Date:  01/31/2001 Discharge Date: 02/06/2001                             Discharge Summary  FINAL DIAGNOSES: 1. Pneumonia, organism nonspecified (486) 2. Fluid overload (276.6) 3. Acute bronchitis (466.0) 4. Asthma without status asthmaticus (483.9) 5. Allergic rhinitis (477.9) 6. Chronic sinusitis (473.9) 7. Iron deficiency anemia (280.9). 8. Unspecified viral illness (079.99).  OPERATIONS AND PROCEDURES:  None.  HISTORY OF PRESENT ILLNESS:  This was the first recent [facility] admission for this 60 year old single black female who presented to the office complaining of increasing cough and congestion of several days duration.  She had been doing fairly well until approximately 3 days prior to admission.  At that time she noted increasing sinus congestion with postnasal drainage.  She gradually developed increasing headache with associated low grade fever as well.  On the day of admission she noted increasing shortness of breath with cough.  Cough was productive of occasional yellowish to greenish phlegm.  She had increasing malaise as well.  The patient was evaluated in the office initially.  Chest x-ray showed early pneumonia.  She was subsequently admitted for further evaluation and therapy.  PAST MEDICAL HISTORY and PHYSICAL EXAMINATION:  Per admission H&P.  HOSPITAL COURSE:  The patient was admitted for further evaluation and treatment of clinical changes most consistent with acute bronchitis with left lower lobe pneumonia.  This was confirmed by chest x-ray.  There was also evidence for asthma, a question of viral syndrome and allergic rhinitis.  The patient was started on IV tequin 400 mg q.d.  She was placed on  nebulizer therapy on a q.4h. basis with gradual rehydration.  Sputum cultures were obtained for gram stain and CNS as well which revealed only heavy normal flora.  Blood cultures remained negative as well.  The patient was given expectorants over the subsequent 24-48 hours as well.  Over the ensuing days she made daily progress with effervesence of any fever. Her peak flows did gradually improve with ongoing nebulizer therapy as well. Despite these measures she continued to have significant malaise.  She was noted to have intermittent bronchospasm as well.  Serial exam showed evidence consistent with sinusitis as well.  With continued intensive therapy her lungs did gradually improve.  She would have some dizziness when ambulating.  This was felt to be secondary to middle ear involvement.  meclozine was used for this.  She underwent orthostatic blood pressure checks which showed minimal change as well.  With continued supportive measures however, the patients lungs did improve. A EKG was done for follow up of exertional dyspnea.  This initially revealed an abnormal result.  A nuclear stress test was ordered as well as repeat CPK and troponin.  Over the subsequent 24 hours the CK enzymes were negative and troponins within normal limits.  A nuclear stress test was to be done for cardiology.  On the subsequent day of reviewing the EKGs it was felt that the previous month may have been an abnormal variant.   Her stress test was cancelled thereafter.  She was noted to still have some wheezing at that time and  she was given IV Solu-Medrol x 1.  This resulted in significant improvement.  By February 06, 2001, the patient was felt to be stable for discharge.  MEDICATIONS AT TIME OF DISCHARGE: 1. Combivent 1 puff q.i.d. 2. Tequin 400 mg p.o. q.d. x 7 days. 3. Atrovent home nebulizer 1 unit q.i.d. p.r.n. 4. Meclizine 12.5 mg t.i.d. p.r.n. dizziness. 5. Prednisone 40 mg p.o. q.d. with slow  taper  DIET:  No concentrated sweets, 4 gram sodium diet.  WORK STATUS:  REmain out of work for the next week.  FOLLOWUP:  Return to this office in 1 weeks time for follow up. Dictated by:   Lind Guest. August Saucer, M.D. Attending Physician:  Gwenyth Bender DD:  03/30/01 TD:  03/30/01 Job: [id number]

## 2010-10-03 NOTE — Op Note (Signed)
Bianca Medina, Bianca Medina                ACCOUNT NO.:  [id number]   MEDICAL RECORD NO.:  [id number]          PATIENT TYPE:  AMB   LOCATION:  ENDO                         FACILITY:  [facility]   PHYSICIAN:  Anselmo Rod, M.D.  DATE OF BIRTH:  [birth year]   DATE OF PROCEDURE:  11/02/2005  DATE OF DISCHARGE:                                 OPERATIVE REPORT   PROCEDURE:  Colonoscopy with cold biopsies x2.   ENDOSCOPIST:  Anselmo Rod, M.D.   INSTRUMENT USED:  Olympus video colonoscope.   INDICATIONS FOR PROCEDURE:  A 60 year old female undergoing screening  colonoscopy. The patient has a family history of colon cancer, left colonic  polyps, masses, etc.   PREPROCEDURE PREPARATION:  Informed consent was procured from the patient.  The patient fasted for eight hours prior to the procedure and was prepped  with a gallon of TriLyte the night prior to the procedure. The risks and  benefits of the procedure including a 10% missed rate of cancer and polyp  were discussed with the patient as well.   PREPROCEDURE PHYSICAL:  The patient had stable vital signs. Neck supple.  Chest clear to auscultation. S1, S2 regular. Abdomen soft with normal bowel  sounds.   DESCRIPTION OF PROCEDURE:  The patient was placed in the left lateral  decubitus position and sedated with 100 mcg of fentanyl and 10 mg of Versed  in slow incremental doses. Once the patient was adequately sedated and  maintained on low flow oxygen and continuous cardiac monitoring, the Olympus  video colonoscope was advanced from the rectum to the cecum. The appendiceal  orifice and ileocecal valve were visualized and photographed. There was a  significant amount of  residual stool in the colon and multiple washings  were done. The terminal ileum appeared normal and without lesions. Two small  sessile polyps were removed via cold biopsy from 80 cm, internal hemorrhoids  were seen on retroflexion, the rest of the exam was unremarkable. There  was  no evidence of diverticulosis, no large masses or polyps were seen. The  patient tolerated the procedure without complications.   IMPRESSION:  1.Two small sessile polyps biopsied from 80 cm.  2.Internal hemorrhoids seen on retroflexion.  3.A large amount of residual stool in the colon, small lesions could be  missed.   RECOMMENDATIONS:  1.Await pathology results.  2.Repeat colonoscopy depending on pathology results.  3.Avoid all nonsteroidals including aspirin for the next 2 weeks.  4.Outpatient followup as need arises in the future.      Anselmo Rod, M.D.  Electronically Signed     JNM/MEDQ  D:  11/02/2005  T:  11/03/2005  Job:  [id number]   cc:   Minerva Areola L. August Saucer, M.D.  Fax: [phone]

## 2010-10-03 NOTE — Procedures (Signed)
Bianca Medina, Bianca Medina                ACCOUNT NO.:  [id number]   MEDICAL RECORD NO.:  [id number]          PATIENT TYPE:  OUT   LOCATION:  SLEEP CENTER                 FACILITY[facility]  [facility]   PHYSICIAN:  Clinton D. Maple Hudson, M.D. DATE OF BIRTH:  1950-12-23   DATE OF STUDY:  02/14/2005                              NOCTURNAL POLYSOMNOGRAM   REFERRING PHYSICIAN:  Dr. Willey Blade.   INDICATION FOR STUDY:  Hypersomnia with sleep apnea. Epworth sleepiness  score 19/24, BMI 44. Weight 264 pounds.   SLEEP ARCHITECTURE:  Total sleep time 386 minutes with sleep efficiency 95%.  Stage I was 3%, stage II 72%, stages III and IV 1%, REM 25% of total sleep  time. Sleep latency 3 minutes, REM latency 74 minutes, awake after sleep  onset 19 minutes, arousal index 10. No bedtime medication was reported.   RESPIRATORY DATA:  Apnea/hypopnea index 12.9 obstructive events per hour  indicating mild obstructive sleep apnea/hypopnea syndrome. There were 24  obstructive apneas and 59 hypopneas. Most events were while supine (AHI 38.9  per hour) or on left side (AHI of 13.1 per hour). REM AHI 40 per hour. She  did not qualify for C-PAP titration by split protocol because of  insufficient early events.   OXYGEN DATA:  Moderate snoring with oxygen desaturation to a nadir of 72%.  Mean oxygen saturation through the study was 93% on room air.   CARDIAC DATA:  Normal sinus rhythm.   MOVEMENT/PARASOMNIA:  Occasional leg jerk, insignificant.   IMPRESSION/RECOMMENDATIONS:  Mild obstructive sleep apnea/hypopnea syndrome,  AHI of 12.9 per hour with moderate snoring and oxygen desaturation briefly  to 72%. Most events were while supine and in REM. She may benefit from  encouragement to sleep off the flat of her back and attention to nasal  congestion if appropriate. She can return for C-PAP titration if medically  appropriate.      Clinton D. Maple Hudson, M.D.  Diplomate, Biomedical engineer of Sleep Medicine  Electronically  Signed     CDY/MEDQ  D:  02/15/2005 15:11:20  T:  02/16/2005 00:29:18  Job:  [id number]

## 2010-10-03 NOTE — Op Note (Signed)
NAME:  Bianca Medina, Bianca Medina                          ACCOUNT NO.:  [id number]   MEDICAL RECORD NO.:  [id number]                   PATIENT TYPE:  INP   LOCATION:  9399                                 FACILITY:  [facility]   PHYSICIAN:  Juan H. Lily Peer, M.D.             DATE OF BIRTH:  [birth year]   DATE OF PROCEDURE:  11/30/2002  DATE OF DISCHARGE:                                 OPERATIVE REPORT   SURGEON:  Juan H. Lily Peer, M.D.   ASSISTANT:  Rande Brunt. Eda Paschal, M.D.   INDICATIONS FOR THE OPERATION:  A 60 year old gravida 3, para 3 with  symptomatic leiomyomata uteri and iron-deficiency anemia initially was  planned to undergo possible transvaginal hysterectomy and bilateral salpingo-  oophorectomy with the possibility of an open laparotomy and a TAH/BSO  approach was also discussed as well.  Examination under anesthesia  demonstrated that the uterus was 12-14 weeks' size, firm, and somewhat  narrow pelvis.  It would have been technically difficult to safely, with  minimal blood loss, to go to the transvaginal route, and it was decided to  proceed with the transabdominal route instead.   PREOPERATIVE DIAGNOSES:  1. Leiomyomata uteri.  2. Menometrorrhagia.  3. Iron-deficiency anemia.  4. Pelvic pains.   POSTOPERATIVE DIAGNOSES:  1. Leiomyomata uteri.  2. Menometrorrhagia.  3. Iron-deficiency anemia.  4. Pelvic pains.   ANESTHESIA:  General endotracheal anesthesia.   SURGEON:  Juan H. Lily Peer, M.D.   FIRST ASSISTANT:  Daniel L. Eda Paschal, M.D.   PROCEDURE PERFORMED:  1. Examination under anesthesia.  2. Total abdominal hysterectomy and bilateral salpingo-oophorectomy.  3. Pelvic adhesiolysis.   FINDINGS:  A 12- to 14-week-sized leiomyomata uteri and pelvic adhesions and  normal ovaries, no evidence of previous tubal sterilization was noted.   DESCRIPTION OF OPERATION:  After the patient was adequately counseled, she  was taken to the operating room where she underwent  successful general  endotracheal anesthesia.  Her abdomen and vagina were prepped and draped in  the usual sterile fashion.  Her legs were in the low lithotomy position.  Her bladder was evacuated of contents with the red rubber Roxan Hockey and exam  under anesthesia demonstrated a 12- to 14-week-sized uterus, which appeared  to be firm _______ as well with a somewhat narrow pelvis.  It was decided  that due to her low hemoglobin of 8.7 g it was decided then to proceed with  an abdominal route in an effort to try to minimize as much bleeding as  possible.  Her legs were straightened and the drapes were placed in an  effort to proceed with an abdominal hysterectomy and a Foley catheter was  inserted in an effort to monitor urinary output.  She received a gram of  Cefotan prophylactically.  She had pneumatic compression stockings in place.   Once the drapes were in place, a Pfannenstiel skin incision was made 2 cm  above the symphysis  pubis.  The incision was carried down through the skin,  subcutaneous tissue down to the rectus fascia, whereby a midline nick was  made and the fascia was incised in a transverse fashion.  The peritoneal  cavity was entered cautiously.  The O'Connor retractors were placed and the  patient was placed in a Trendelenburg position.  Using an abdominal probe,  it was difficult to try to gain good access to the adnexa due to the size of  the ovary, but with meticulous dissection and pelvic adhesiolysis, the right  round ligament was identified, suture ligated with 0 Vicryl suture, and  transected.  The bladder flap was established and the right ureter was  identified and the surgeon's fingers found the posterior broad ligament and  the right infundibulopelvic ligament was clamped, cut, and suture ligated  with 0 Vicryl suture, thereby removing the right tube and ovary and leaving  it attached to the uterus to be removed all together.  The parametrium was  incised and  the remainder of the broad cardinal ligament was held, cut, and  suture ligated to the level of the uterine artery.  A similar procedure was  carried out on the contralateral side, although it was more difficulty and I  decided to leave the ovary and remove it after the removal of the uterus.   Once the uterine arteries were all cut and suture ligated, the uterus was  amputated with single-tooth tenaculum, which was used to grasp the cervical  stump and the remainder of the cervix to the levels of the fornices were  clamped, cut and suture ligated, and with the use of Jorgenson scissors the  cervix was excised off the vagina, passed off the operative field.  Both  ankles were secured with a transfixation stitch of 0 Vicryl suture.  The  remainder of the cuff was suture ligated with interrupted sutures of 0  Vicryl suture.  Attention was then placed to the left tube and ovary.  The  left ureter was identified away from the infundibulopelvic ligament, which  was clamped, cut and suture ligated versus 0 Vicryl suture followed by  transfixation stitch, and the left tube and ovary were passed off the  operative field to be sent to pathology with the uterus and right tube and  ovary and cervix.  The pelvic cavity was then copiously irrigated with  normal saline solution.  The patient oozed and bled quite a lot during her  surgery.  She was started to be transfused two units of packed red blood  cells as she had been crossmatched before her surgery.  Intercede was placed  in the vaginal cuff for additional hemostasis, after the pelvic cavity was  copiously irrigated with normal saline solution and after obtaining adequate  hemostasis.  The sponge count and needle count were correct.  The Lenox Ahr retractors were removed.  The visceral peritoneum was now reapproximated and the rectus fascia was closed with a running stitch of 0  Vicryl suture.  The subcutaneous bleeders were Bovie  cauterized and the skin  was reapproximated with skin clips followed by placement of Xeroform gauze  and 4 x 8 dressing.  The patient was extubated, transferred to the recovery  room with stable vital signs.  Blood loss was reported as 1000 cc.  Urine  output was 100 cc.  IV fluid was 3300 cc of lactated Ringer's, 200 cc of  lactated Ringer's, and two units of packed red blood cells (500 cc).  Juan H. Lily Peer, M.D.    JHF/MEDQ  D:  11/30/2002  T:  11/30/2002  Job:  [id number]

## 2010-10-03 NOTE — H&P (Signed)
NAME:  Bianca Medina, Bianca Medina NO.:  [id number]   MEDICAL RECORD NO.:  [id number]                   PATIENT TYPE:   LOCATION:                                       FACILITY:   PHYSICIAN:  Juan H. Lily Peer, M.D.             DATE OF BIRTH:   DATE OF ADMISSION:  11/30/2002  DATE OF DISCHARGE:                                HISTORY & PHYSICAL   CHIEF COMPLAINT:  Symptomatic leiomyomatous uteri and iron deficiency  anemia.   HISTORY OF PRESENT ILLNESS:  The patient is a 60 year old, gravida 3, para 3  (three normal spontaneous vaginal deliveries), history of a laparoscopic  tubal sterilization procedure, who has been suffering from iron deficiency  anemia and leiomyomatous uteri for several years with different regimens,  attempting to control her bleeding.  The last one consisting of Megace 20 mg  three times a day.  She has also been under the care of L. Lupe Carney,  M.D., who in the month of May through June had administered iron injections  q.week x4.  Her most recent hemoglobin and hematocrit in Dr. Quita Skye  office on June 11 had a hemoglobin of 11.1, hematocrit 33.1, platelet count  of 292,000.  The patient's most recent ultrasound in the office of [facility] on  February 17, demonstrated a uterus measuring 4.4 x 6.6 x 7.6 cm and  endometrial stripe of 16.8 mm, prominent cavity with an anterior submucous  myoma and an intramural myoma with calcifications measuring 30 x 28 mm. She  also had a submucosal myoma measuring 30 x 32 mm with calcification  displacing the endometrial cavity.  Also additional intramural myomas  measuring 4 x 13 mm and 16 x 10 mm respectively and a left intramural myoma  measuring 38 x 32 mm with calcification as well. The right ovary was normal  dimensions as was the left ovary.  The left ovary had simple echo-free cysts  measuring 13 x 12 mm and one 11 mm.  The cul-de-sac was negative.  As the  result of her menometrorrhagia, she  had an endometrial biopsy that was done  in March of this year which demonstrated secretory endometrium and no  hyperplasia identified. Her last Pap smear on August 23, 2002, was normal and  her last mammogram was in April of this year which was normal as well.  The  patient had a preoperative consultation and physical examination in April of  this year.  The patient has finally decided to proceed with a hysterectomy  and she is scheduled for a transvaginal hysterectomy with bilateral salpingo-  oophorectomy.   PAST MEDICAL HISTORY:  1. Leiomyomatous uteri.  2. Iron deficiency anemia.  3. Three normal spontaneous vaginal deliveries.  4. Laparoscopic tubal sterilization procedure.   Medical problems consist of GERD, PMDD, asthma.   MEDICATIONS:  Multivitamins, iron supplementation, Nexium, Paxil 10 mg  daily, calcium supplementation consisting of  1200 to 1500 mg daily.   FAMILY HISTORY:  Father with history of insulin-dependent diabetes mellitus  and grandmother and aunt with history of colon cancer.   ALLERGIES:  No known drug allergies.   PHYSICAL EXAMINATION:  VITAL SIGNS: Blood pressure 122/72, 5 feet 5 inches  tall, weight 226 pounds.  HEENT:  Unremarkable.  NECK:  Supple. Trachea midline. No carotid bruits. No thyromegaly.  LUNGS:  Clear to auscultation with no rhonchi or wheezes.  HEART:  Regular rate and rhythm with no murmurs, rubs, or gallops.  BREASTS:  At the time of her annual examination was normal.  ABDOMEN:  Soft, nontender without rebound or guarding.  PELVIC:  Bartholin's, urethra, and Skene's glands are within normal limits.  Vagina and cervix with no gross lesions on inspection.  Uterus is 12 weeks  size and irregular. Adnexa difficult to evaluate secondary to size of the  uterus and otherwise normal by recent ultrasound.  RECTAL: Hemoccult was negative.   ASSESSMENT:  A 60 year old gravida 3, para 3 with longstanding history of  leiomyomatous uterus and iron  deficiency anemia on supplemental iron and  received iron injections in the months of May through June, also on  supplemental oral iron she had been on Megace and anti estrogen to stop her  bleeding, 20 mg three times a day. The patient after several years has  decided to proceed with a hysterectomy.  She has had three normal  spontaneous vaginal deliveries and there is good mobility of the uterus, we  have decided to proceed with a transvaginal approach with removal of both  tubes and ovaries.  In the event of anything clinical difficulty  encountered, the patient is fully aware that the operation may need to be  completed through an abdominal route. Potential complications from the  surgery can include infection, bleeding, trauma to internal organs such as  the bladder, rectum, blood vessels, nerves, and intestines.  She will  receive prophylaxis antibiotics for infection.  In the event of a  hemorrhage, she is fully aware that she may need blood products and blood  transfusion with potential risk of anaphylactic reactions, hepatitis, and  AIDS.  Also she will have pneumatic compression stockings in an effort to  prevent deep venous thrombosis, although, she can potentially develop a deep  venous thrombosis and a pulmonary embolism.  The patient is fully aware of  all of these risks.  All questions are answered and will follow according to  plan. The patient is scheduled for a transvaginal hysterectomy with  bilateral salpingo-oophorectomy on Thursday, July 15, at 7:30 a.m. at  [facility].                                               Alpha H. Lily Peer, M.D.    JHF/MEDQ  D:  11/29/2002  T:  11/30/2002  Job:  [id number]

## 2010-10-03 NOTE — H&P (Signed)
[place]  Patient:    Bianca Medina, Bianca Medina Visit Number: [id number] MRN: [id number]          Service Type: MED Location: [phone] Attending Physician:  Gwenyth Bender Dictated by:   Lind Guest. August Saucer, M.D. Admit Date:  01/31/2001                           History and Physical  CHIEF COMPLAINT:  Increasing shortness of breath, cough, malaise, and with wheezing.  HISTORY OF PRESENT ILLNESS:  First recent [facility] admission for this 60 year old single black female who presented to the office complaining of increasing cough and congestion of several days duration.  She had been doing well until approximately three days ago.  At that time she had noted increasing sinus congestion with post nasal drainage.  She developed increasing headaches over the subsequent day.  She later developed a fever up to 102 one day prior to admission.  Today, she noted increasing shortness of breath with cough.  Cough was productive of occasional yellowish to greenish phlegm.  She had increasing malaise as well.  The patient was seen in the office with asthma and severe weakness.  Chest x-ray showed evidence for early pneumonia.  Patient subsequently admitted for further evaluation and therapy.  PAST MEDICAL HISTORY:  Remarkable for longstanding allergies, which she had not been on medication recently.  Previously on Allegra.  No documented history of asthma in the past.  Longstanding history of mild to moderate obesity as well.  HABITS:  The patient does not smoke or drink.  REVIEW OF SYSTEMS:  As noted above.  She notably had not been exposed to anyone with recent flu or cold-like symptoms.  The patient denies chest pains or palpitations.  ALLERGIES:  Patient has no known allergies.  MEDICATIONS:  Most recent medications consist of multivitamins and iron supplementation.  FURTHER REVIEW OF SYSTEMS:  Otherwise notable for previously documented  iron deficiency anemia.  SOCIAL HISTORY:  Patient is single, lives alone.  PHYSICAL EXAMINATION:  GENERAL:  She is a well-developed, ill-appearing black female.  VITAL SIGNS:  Height 5 feet 6 inches, weight 220 pounds.  Blood pressure 134/73, pulse 88, respiratory rate 16, temperature 99.1.  HEENT:  Head normocephalic, atraumatic, without bruits.  Extraocular muscles are intact.  Fundi are grade 0.  She has bilateral maxillary sinus tenderness. Left turbinate edema greater than right.  TMs with decreased light reflex bilaterally with erythematous changes on the left versus right.  Throat with posterior pharyngeal erythema.  No exudates appreciated.  NECK:  Supple.  No enlarged thyroid.  Small posterior cervical nodes on the left versus the right.  LUNGS:  Notable for distant breath sounds bilaterally.  She has expiratory wheeze bilaterally.  There is left CVA tenderness as well.  CARDIOVASCULAR:  Shows normal S1, S2.  No S3, S4, murmurs, rubs.  ABDOMEN:  Obese.  Bowel sounds were present.  No enlarged liver or spleen, mass or tenderness.  MUSCULOSKELETAL:  Full passive range of motion in the lower extremities. Minimal crepitus in the knees.  Negative Homans, no edema.  Pulses intact.  NEUROLOGIC:  Intact.  LABORATORY DATA:  CBC reveals WBC 10,200; hemoglobin 11.7; hematocrit 35.8; platelets 335,000; MCV 70.9; neutrophils 80%.  Chemistry shows sodium 139, potassium 3.7, chloride 1.06, CO2 29, BUN 8, creatinine 0.7, glucose 126 - which is mildly elevated, total protein 7.5, albumin 3.4 - which is decreased.  SGOT 20, SGPT 19.  Chest x-ray reveals slightly increased markings ______ at the left base with mild atelectasis as well, questionable left lower lobe pneumonia.  EKG pending.  IMPRESSION: 1. Acute bronchitis with early left lower lobe pneumonia. 2. Acute asthma, new onset. 3. Rule out viral syndrome. 4. Allergic rhinitis with sinusitis. 5. Transient fever.  PLAN:   Will start patient on Tequin IV at 400 mg q.d.  Do nebulizers q.4h. or q.i.d.  Rehydration.  Sputum cultures for gram stain C&S.  Blood cultures for temperature spike greater than 100.3.  Expectorants as necessary.  Close observation for next 24 hours.  Further therapy pending response to the above. Dictated by:   Lind Guest. August Saucer, M.D. Attending Physician:  Gwenyth Bender DD:  01/31/01 TD:  01/31/01 Job: [id number]

## 2010-10-03 NOTE — Procedures (Signed)
[place]  Patient:    Bianca Medina, Bianca Medina                       MRN: [id number] Proc. Date: 04/27/00 Adm. Date:  [id number] Attending:  Charna Elizabeth CC:         Lind Guest. August Saucer, M.D.   Procedure Report  DATE OF BIRTH:  [birth year].  PROCEDURE:  Colonoscopy.  ENDOSCOPIST:  Anselmo Rod, M.D.  INSTRUMENT USED:   Olympus video colonoscope.  INDICATION FOR PROCEDURE:  Family history of colon cancer in a 60 year old African-American female.  Rule out colonic polyps, masses, hemorrhoids, etc.  PREPROCEDURE PREPARATION:  Informed consent was procured from the patient. The patient was fasted for eight hours prior to the procedure and prepped with a bottle of magnesium citrate and a gallon of NuLytely the night prior to the procedure.  PREPROCEDURE PHYSICAL:  VITAL SIGNS:  The patient had stable vital signs.  NECK:  Supple.  CHEST:  Clear to auscultation.  S1, S2 regular.  ABDOMEN:  Soft with normal abdominal bowel sounds.  DESCRIPTION OF PROCEDURE:  The patient was placed in the left lateral decubitus position and sedated with 50 mg of Demerol and 5 mg of Versed intravenously.  Once the patient was adequately sedate and maintained on low-flow oxygen and continuous cardiac monitoring, the Olympus video colonoscope was advanced from the rectum to the cecum with slight difficulty secondary to some residual stool in the colon.  There were no masses, polyps, erosions, ulcerations, or diverticulosis seen.  The patient had a small internal hemorrhoid and tolerated the procedure well without complication.  RECOMMENDATIONS:  The patient has been advised to increase her fluid and fiber in her diet and to have repeat colorectal cancer screening in the next five to 10 years unless she were to develop any abnormal symptoms in the interim. Follow-up is advised with Dr. Willey Blade.  She is to contact me for GI problems on a p.r.n. basis. DD:   04/27/00 TD:  04/27/00 Job: [id number]

## 2010-11-06 ENCOUNTER — Ambulatory Visit (INDEPENDENT_AMBULATORY_CARE_PROVIDER_SITE_OTHER): Payer: PRIVATE HEALTH INSURANCE | Admitting: Gynecology

## 2010-11-06 DIAGNOSIS — N644 Mastodynia: Secondary | ICD-10-CM

## 2011-06-19 ENCOUNTER — Encounter: Payer: Self-pay | Admitting: *Deleted

## 2011-06-19 NOTE — Progress Notes (Signed)
Patient ID: Bianca Medina, female   DOB: 03-25-1951, 61 y.o.   MRN: [id number] Pt called asking about HRT and wanting to start. I explained to pt that you would need to make appointment with [facility] and talk with him about medication. Pt is overdue for her annual,pt said she will call back next week to schedule OV.

## 2011-10-07 ENCOUNTER — Encounter (HOSPITAL_COMMUNITY): Payer: Self-pay

## 2011-10-07 ENCOUNTER — Emergency Department (INDEPENDENT_AMBULATORY_CARE_PROVIDER_SITE_OTHER)
Admission: EM | Admit: 2011-10-07 | Discharge: 2011-10-07 | Disposition: A | Discharge status: Home / Self Care | Payer: Self-pay | Source: Home / Self Care | Attending: Family Medicine | Admitting: Family Medicine

## 2011-10-07 ENCOUNTER — Emergency Department (INDEPENDENT_AMBULATORY_CARE_PROVIDER_SITE_OTHER): Payer: PRIVATE HEALTH INSURANCE

## 2011-10-07 DIAGNOSIS — S92919A Unspecified fracture of unspecified toe(s), initial encounter for closed fracture: Secondary | ICD-10-CM

## 2011-10-07 DIAGNOSIS — S92911A Unspecified fracture of right toe(s), initial encounter for closed fracture: Secondary | ICD-10-CM

## 2011-10-07 IMAGING — CR DG FOOT COMPLETE 3+V*R*
3 series · 3 of 3 positions shown · non-contrast
Comparison: [DATE]

CLINICAL DATA: Injury

RIGHT FOOT COMPLETE - 3+ VIEW

[view not recorded (1 of 3)]
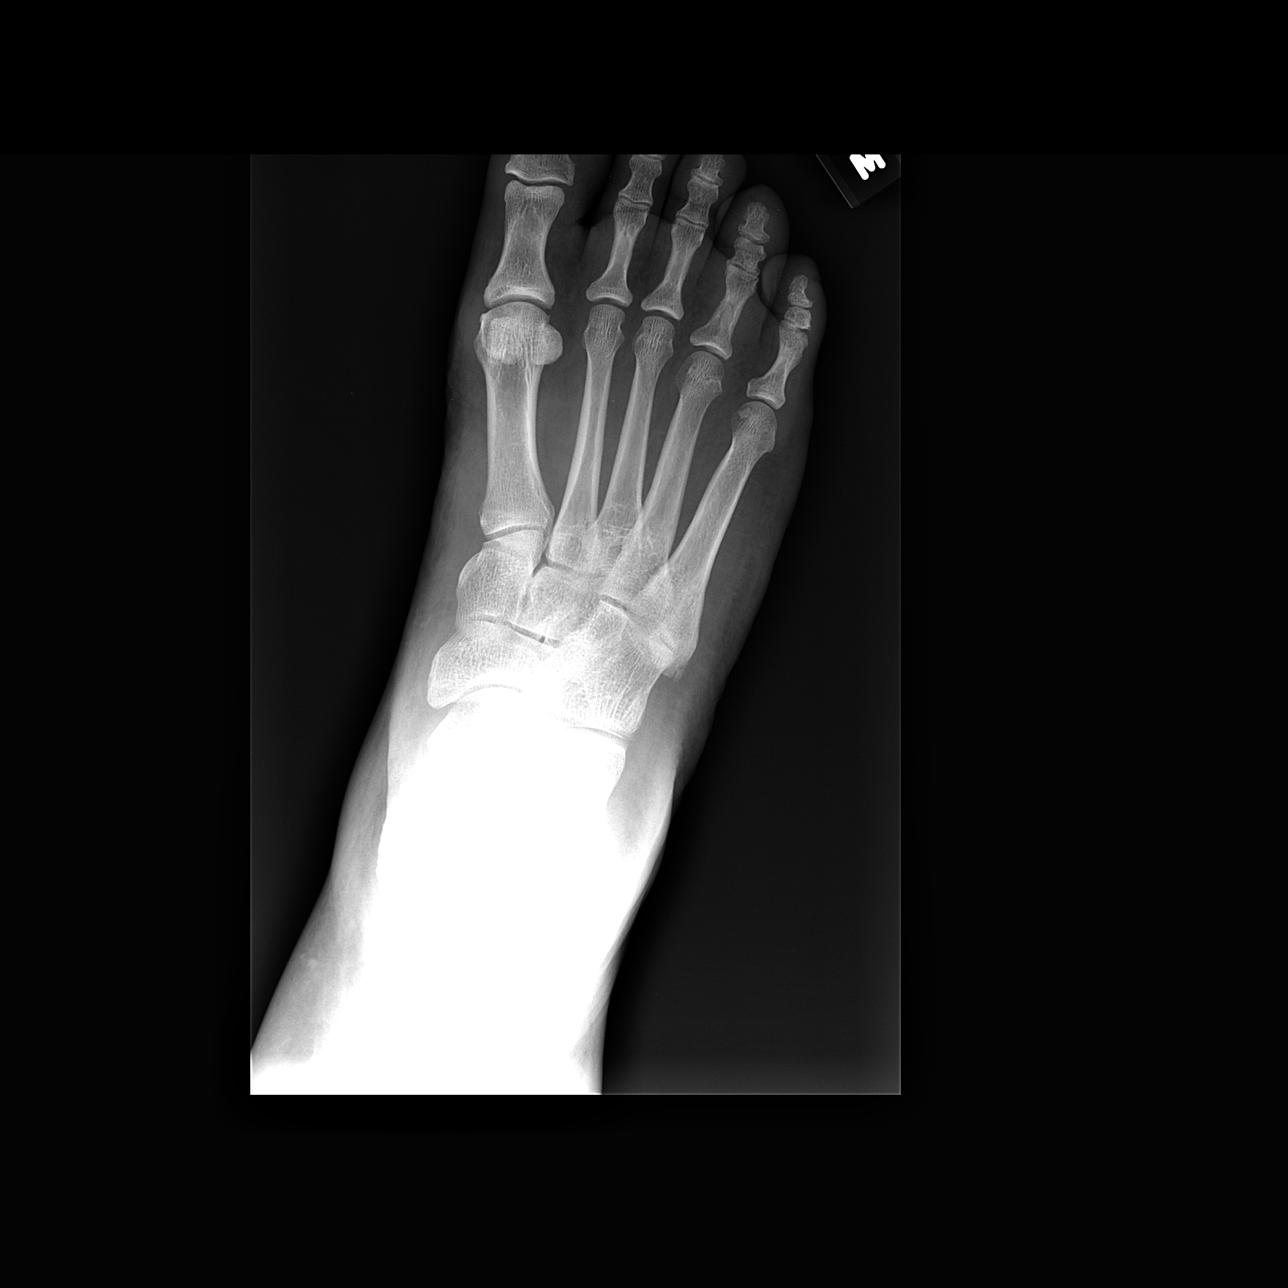

[view not recorded (2 of 3)]
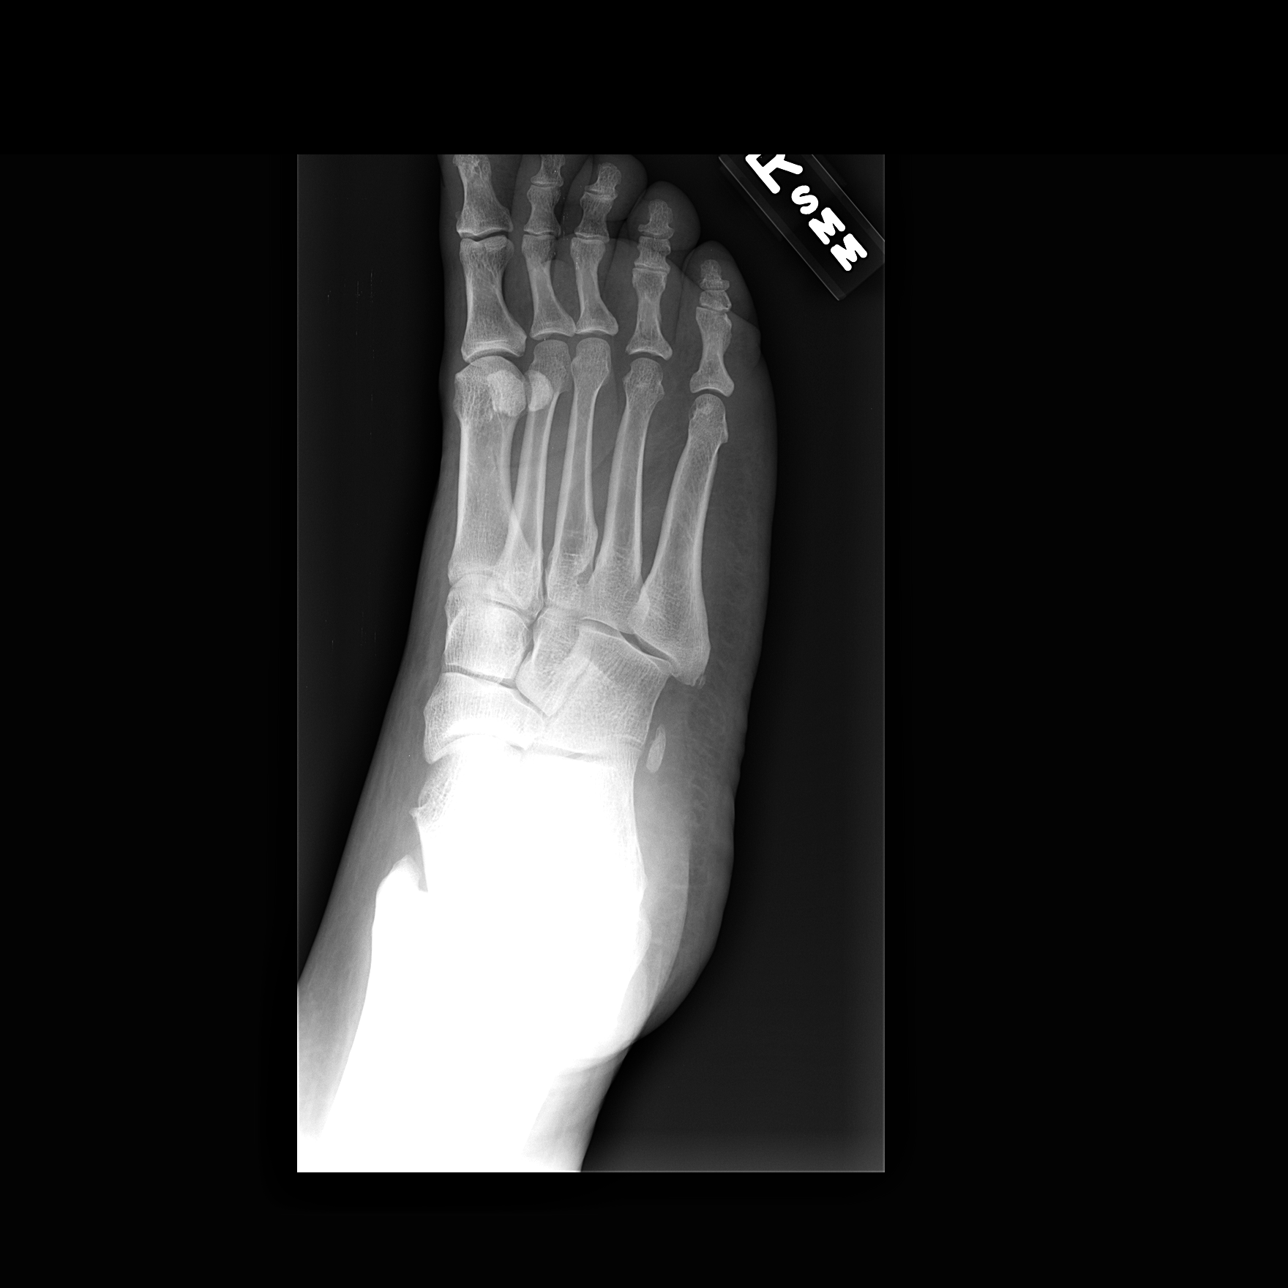

[view not recorded (3 of 3)]
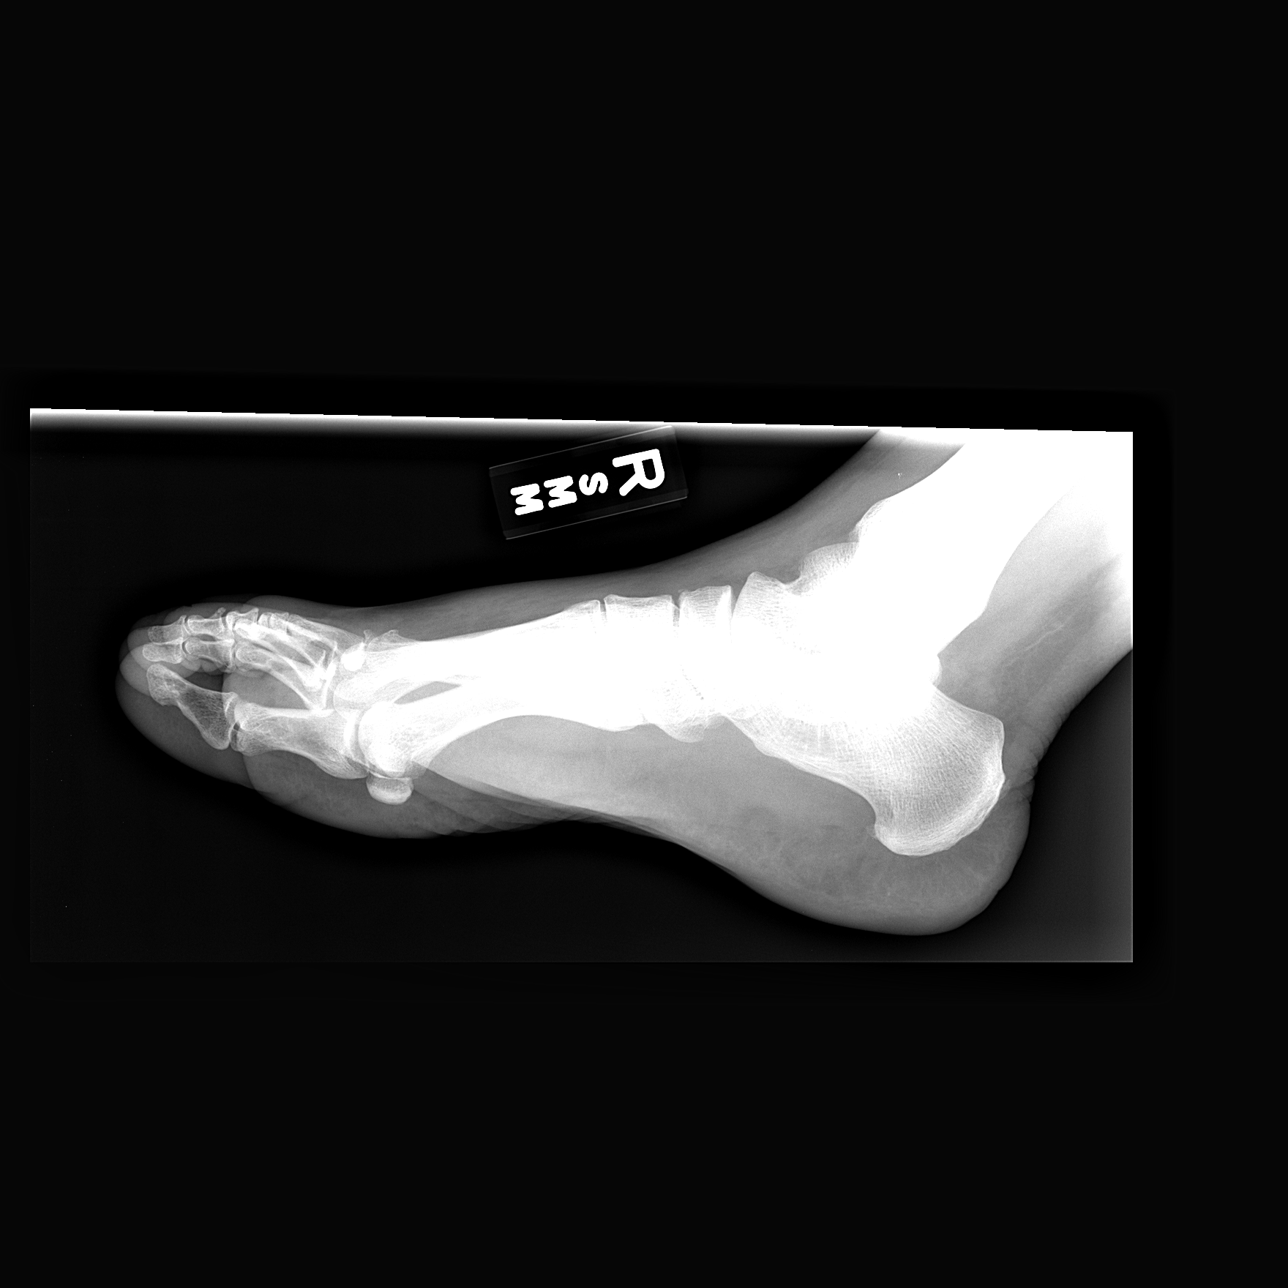

[3 of 3 positions shown; findings below may reference images not displayed]

FINDINGS: Comminuted fracture involving the proximal phalanx of the
fourth toe with minimal displacement.  Fracture lines are obscured
likely due to subacute age.  Otherwise intact bony framework.
IMPRESSION: Subacute fracture involving the proximal phalanx of the fourth toe

## 2011-10-07 NOTE — ED Notes (Signed)
Pt states she bumped her rt 4th toe on a wall approx. 1 month ago and then again 2 weeks ago. States has been painful, swollen and bruised for 2 weeks.

## 2011-10-07 NOTE — Discharge Instructions (Signed)
Wear firm shoe and soak nightly until pain and swelling resolve, activity as tolerated.

## 2011-10-07 NOTE — ED Provider Notes (Cosign Needed)
History     CSN: [id number]  Arrival date & time 10/07/11  1744   First MD Initiated Contact with Patient 10/07/11 1755      Chief Complaint  Patient presents with  . Toe Injury    (Consider location/radiation/quality/duration/timing/severity/associated sxs/prior treatment) Patient is a 61 y.o. female presenting with toe pain. The history is provided by the patient.  Toe Pain This is a new problem. The current episode started more than 1 week ago (hit wall 1 mo ago then again 2 weeks ago , still sore.). The problem has not changed since onset.The symptoms are aggravated by walking.    Past Medical History  Diagnosis Date  . Asthma     Past Surgical History  Procedure Date  . Abdominal hysterectomy     No family history on file.  History  Substance Use Topics  . Smoking status: Never Smoker   . Smokeless tobacco: Not on file  . Alcohol Use: No    OB History    Grav Para Term Preterm Abortions TAB SAB Ect Mult Living                  Review of Systems  Constitutional: Negative.   Musculoskeletal: Positive for joint swelling.  Skin: Negative.     Allergies  Codeine  Home Medications   Current Outpatient Rx  Name Route Sig Dispense Refill  . NEXIUM PO Oral Take by mouth as needed.      BP 144/67  Pulse 58  Temp(Src) 98.1 F (36.7 C) (Oral)  Resp 20  SpO2 100%  Physical Exam  Nursing note and vitals reviewed. Constitutional: She is oriented to person, place, and time. She appears well-developed and well-nourished.  Musculoskeletal: She exhibits tenderness.       Feet:  Neurological: She is alert and oriented to person, place, and time.  Skin: Skin is warm and dry.    ED Course  Procedures (including critical care time)  Labs Reviewed - No data to display Dg Foot Complete Right  10/07/2011  *RADIOLOGY REPORT*  Clinical Data: Injury  RIGHT FOOT COMPLETE - 3+ VIEW  Comparison: 01/10/2008  Findings: Comminuted fracture involving the proximal  phalanx of the fourth toe with minimal displacement.  Fracture lines are obscured likely due to subacute age.  Otherwise intact bony framework.  IMPRESSION: Subacute fracture involving the proximal phalanx of the fourth toe  Original Report Authenticated By: Donavan Burnet, M.D.     1. Toe fracture, right, closed, initial encounter       MDM  X-rays reviewed and report per radiologist.         Linna Hoff, MD 10/07/11 2006

## 2011-12-27 ENCOUNTER — Encounter (HOSPITAL_COMMUNITY): Payer: Self-pay

## 2011-12-27 ENCOUNTER — Emergency Department (HOSPITAL_COMMUNITY)
Admission: EM | Admit: 2011-12-27 | Discharge: 2011-12-27 | Disposition: A | Discharge status: Home / Self Care | Payer: PRIVATE HEALTH INSURANCE | Source: Home / Self Care | Attending: Emergency Medicine | Admitting: Emergency Medicine

## 2011-12-27 DIAGNOSIS — T148XXA Other injury of unspecified body region, initial encounter: Secondary | ICD-10-CM

## 2011-12-27 MED ORDER — KETOROLAC TROMETHAMINE 60 MG/2ML IM SOLN
60.0000 mg | Freq: Once | INTRAMUSCULAR | Status: AC
  Administered 2011-12-27: 60 mg via INTRAMUSCULAR

## 2011-12-27 MED ORDER — KETOROLAC TROMETHAMINE 60 MG/2ML IM SOLN
INTRAMUSCULAR | Status: AC
  Filled 2011-12-27: qty 2

## 2011-12-27 MED ORDER — IBUPROFEN 800 MG PO TABS: 800.0000 mg | ORAL_TABLET | Freq: Three times a day (TID) | ORAL | Status: AC

## 2011-12-27 MED ORDER — CYCLOBENZAPRINE HCL 10 MG PO TABS: 10.0000 mg | ORAL_TABLET | Freq: Two times a day (BID) | ORAL | Status: AC | PRN

## 2011-12-27 NOTE — ED Provider Notes (Signed)
History     CSN: [id number]  Arrival date & time 12/27/11  1122   First MD Initiated Contact with Patient 12/27/11 1123      Chief Complaint  Patient presents with  . Neck Pain    (Consider location/radiation/quality/duration/timing/severity/associated sxs/prior treatment) The history is provided by the patient.   Bianca Medina is a 61 y.o. female who complains of left neck pain for five weeks. The pain is positional with movement of neck but within the past weeks it has gotten increasingly worse.  No radiation of pain down the arms. Mechanism of injury: none known.  Denies prior history of neck problems.  There is no numbness, tingling, weakness in the arms.  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  Past Medical History  Diagnosis Date  . Asthma     Past Surgical History  Procedure Date  . Abdominal hysterectomy     History reviewed. No pertinent family history.  History  Substance Use Topics  . Smoking status: Never Smoker   . Smokeless tobacco: Not on file  . Alcohol Use: No    OB History    Grav Para Term Preterm Abortions TAB SAB Ect Mult Living                  Review of Systems  Constitutional: Negative.   HENT: Positive for neck pain. Negative for hearing loss, ear pain, trouble swallowing, neck stiffness and ear discharge.   Respiratory: Negative.   Cardiovascular: Negative.   Musculoskeletal: Positive for arthralgias. Negative for myalgias, back pain, joint swelling and gait problem.  Neurological: Negative.     Allergies  Codeine  Home Medications   Current Outpatient Rx  Name Route Sig Dispense Refill  . CYCLOBENZAPRINE HCL 10 MG PO TABS Oral Take 1 tablet (10 mg total) by mouth 2 (two) times daily as needed for muscle spasms. 20 tablet 0  . NEXIUM PO Oral Take by mouth as needed.    . IBUPROFEN 800 MG PO TABS Oral Take 1 tablet (800 mg total) by mouth 3 (three) times daily. 21 tablet 0     BP 145/71  Pulse 62  Temp 97.9 F (36.6 C) (Oral)  Resp 18  SpO2 100%  Physical Exam  Nursing note and vitals reviewed. Constitutional: She is oriented to person, place, and time. Vital signs are normal. She appears well-developed and well-nourished. She is active and cooperative.  HENT:  Head: Normocephalic.  Eyes: Conjunctivae are normal. Pupils are equal, round, and reactive to light. No scleral icterus.  Neck: Trachea normal and normal range of motion. Neck supple. Muscular tenderness present. No spinous process tenderness present. No rigidity. No edema, no erythema and normal range of motion present. No Brudzinski's sign noted. No thyromegaly present.       Full passive ROM with tenderness on left side, left paraspinal cervical tenderness,   Cardiovascular: Normal rate, regular rhythm and normal pulses.   Pulmonary/Chest: Effort normal and breath sounds normal.  Musculoskeletal:       Right shoulder: Normal.       Left shoulder: Normal.       Cervical back: Normal.       Thoracic back: Normal.       Back:       Arms: Lymphadenopathy:       Head (right side): No submental, no submandibular, no tonsillar, no preauricular, no posterior auricular and no occipital adenopathy present.  Head (left side): No submental, no submandibular, no tonsillar, no preauricular, no posterior auricular and no occipital adenopathy present.    She has no cervical adenopathy.  Neurological: She is alert and oriented to person, place, and time. She has normal strength. No cranial nerve deficit or sensory deficit. GCS eye subscore is 4. GCS verbal subscore is 5. GCS motor subscore is 6.       Bilateral equal hand grips, strenghth normal, sensation intact  Skin: Skin is warm and dry.  Psychiatric: She has a normal mood and affect. Her speech is normal and behavior is normal. Judgment and thought content normal. Cognition and memory are normal.    ED Course  Procedures (including critical  care time)  Labs Reviewed - No data to display No results found.   1. Muscle strain       MDM  Toradol 60mg  IM administered in office. 1311 pain improved.  Take medication as prescribed.  Continue warm compresses.  Follow up with your primary care provider if symptoms are not improved.  No imaging indicated at this time.         Johnsie Kindred, NP 12/27/11 1312

## 2011-12-27 NOTE — ED Notes (Signed)
Pt has left sided neck pain that radiates to her shoulder, states it is contstant and worsens at times.  She has had the symptoms for five weeks and thought she had slept wrong and then thought it was stress related.  Now has headache and is concerned that it has lasted for five weeks.

## 2011-12-30 NOTE — ED Provider Notes (Signed)
Medical screening examination/treatment/procedure(s) were performed by non-physician practitioner and as supervising physician I was immediately available for consultation/collaboration.  Luiz Blare MD   Luiz Blare, MD 12/30/11 [phone]

## 2012-02-18 ENCOUNTER — Emergency Department (HOSPITAL_COMMUNITY)
Admission: EM | Admit: 2012-02-18 | Discharge: 2012-02-18 | Disposition: A | Discharge status: Home / Self Care | Payer: PRIVATE HEALTH INSURANCE | Source: Home / Self Care

## 2012-02-18 ENCOUNTER — Encounter (HOSPITAL_COMMUNITY): Payer: Self-pay | Admitting: *Deleted

## 2012-02-18 DIAGNOSIS — Z23 Encounter for immunization: Secondary | ICD-10-CM

## 2012-02-18 DIAGNOSIS — Z2839 Other underimmunization status: Secondary | ICD-10-CM

## 2012-02-18 MED ORDER — TETANUS-DIPHTH-ACELL PERTUSSIS 5-2.5-18.5 LF-MCG/0.5 IM SUSP
INTRAMUSCULAR | Status: AC
  Filled 2012-02-18: qty 0.5

## 2012-02-18 MED ORDER — TETANUS-DIPHTH-ACELL PERTUSSIS 5-2.5-18.5 LF-MCG/0.5 IM SUSP
0.5000 mL | Freq: Once | INTRAMUSCULAR | Status: AC
  Administered 2012-02-18: 0.5 mL via INTRAMUSCULAR

## 2012-02-18 NOTE — ED Notes (Signed)
"   we are having a new baby in the family and I need to get  My tdap vaccine"

## 2012-02-18 NOTE — ED Provider Notes (Signed)
Medical screening examination/treatment/procedure(s) were performed by non-physician practitioner and as supervising physician I was immediately available for consultation/collaboration.  Raynald Blend, MD 02/18/12 [phone]

## 2012-02-18 NOTE — ED Provider Notes (Signed)
History     CSN: [id number]  Arrival date & time 02/18/12  1433   None     Chief Complaint  Patient presents with  . Immunizations    (Consider location/radiation/quality/duration/timing/severity/associated sxs/prior treatment) HPI Comments: 61 year old female presents with a request for immunizations for Tdap. She states that it has been over 10 years since her last immunization. She denies fever chills or other ill symptoms. She was prompted to obtain this immunization but calls her grandbabies coming to visit her   Past Medical History  Diagnosis Date  . Asthma     Past Surgical History  Procedure Date  . Abdominal hysterectomy     Family History  Problem Relation Age of Onset  . Family history unknown: Yes    History  Substance Use Topics  . Smoking status: Never Smoker   . Smokeless tobacco: Not on file  . Alcohol Use: No    OB History    Grav Para Term Preterm Abortions TAB SAB Ect Mult Living                  Review of Systems  Constitutional: Negative.   HENT: Negative.   Musculoskeletal: Negative.     Allergies  Codeine  Home Medications   Current Outpatient Rx  Name Route Sig Dispense Refill  . NEXIUM PO Oral Take by mouth as needed.      BP 118/67  Pulse 77  Temp 98.5 F (36.9 C) (Oral)  Resp 17  SpO2 97%  Physical Exam  Constitutional: She is oriented to person, place, and time. She appears well-developed and well-nourished. No distress.  HENT:  Head: Normocephalic and atraumatic.  Eyes: Conjunctivae normal and EOM are normal.  Neck: Normal range of motion. Neck supple.  Pulmonary/Chest: Effort normal.  Musculoskeletal: Normal range of motion.  Neurological: She is alert and oriented to person, place, and time. No cranial nerve deficit.  Skin: Skin is warm and dry.  Psychiatric: She has a normal mood and affect.    ED Course  Procedures (including critical care time)  Labs Reviewed - No data to display No results  found.   1. Scheduled immunizations not up to date       MDM  Administer Tdap vaccine now. Start with instructions.         Hayden Rasmussen, NP 02/18/12 [phone]

## 2012-04-22 LAB — HEMOGLOBIN A1C: Hgb A1c MFr Bld: 6.2 % — AB (ref 4.0–6.0)

## 2012-04-22 LAB — BASIC METABOLIC PANEL
Creatinine: 0.8 mg/dL (ref 0.5–1.1)
Sodium: 141 mmol/L (ref 137–147)

## 2012-04-22 LAB — CBC AND DIFFERENTIAL
Hemoglobin: 13.1 g/dL (ref 12.0–16.0)
Platelets: 249 10*3/uL (ref 150–399)
WBC: 9.6 10^3/mL

## 2012-04-22 LAB — LIPID PANEL: LDl/HDL Ratio: 3.3

## 2012-04-22 LAB — HEPATIC FUNCTION PANEL
ALT: 14 U/L (ref 7–35)
AST: 13 U/L (ref 13–35)
Alkaline Phosphatase: 89 U/L (ref 25–125)

## 2012-04-22 LAB — TSH: TSH: 0.79 u[IU]/mL (ref 0.41–5.90)

## 2012-06-13 ENCOUNTER — Other Ambulatory Visit: Payer: Self-pay | Admitting: Gynecology

## 2012-06-13 DIAGNOSIS — Z1231 Encounter for screening mammogram for malignant neoplasm of breast: Secondary | ICD-10-CM

## 2012-07-11 ENCOUNTER — Ambulatory Visit: Payer: PRIVATE HEALTH INSURANCE

## 2012-07-11 ENCOUNTER — Encounter: Payer: PRIVATE HEALTH INSURANCE | Admitting: Gynecology

## 2012-07-20 ENCOUNTER — Encounter: Payer: Self-pay | Admitting: Hematology

## 2012-10-31 ENCOUNTER — Ambulatory Visit (INDEPENDENT_AMBULATORY_CARE_PROVIDER_SITE_OTHER): Payer: BC Managed Care – PPO | Admitting: Physician Assistant

## 2012-10-31 VITALS — BP 132/80 | HR 70 | Temp 98.1°F | Resp 18 | Wt 259.0 lb

## 2012-10-31 DIAGNOSIS — IMO0002 Reserved for concepts with insufficient information to code with codable children: Secondary | ICD-10-CM

## 2012-10-31 DIAGNOSIS — R12 Heartburn: Secondary | ICD-10-CM | POA: Insufficient documentation

## 2012-10-31 DIAGNOSIS — S90852A Superficial foreign body, left foot, initial encounter: Secondary | ICD-10-CM

## 2012-10-31 DIAGNOSIS — M79672 Pain in left foot: Secondary | ICD-10-CM

## 2012-10-31 DIAGNOSIS — M79609 Pain in unspecified limb: Secondary | ICD-10-CM

## 2012-10-31 DIAGNOSIS — I10 Essential (primary) hypertension: Secondary | ICD-10-CM | POA: Insufficient documentation

## 2012-10-31 NOTE — Progress Notes (Signed)
   [address] [place] [address]   Phone [phone]  Subjective:    Patient ID: Bianca Medina, female    DOB: 10-22-1950, 62 y.o.   MRN: [id number]  HPI Pt presents to clinic with a piece of glass in her L foot from a broken vase yesterday.  Every time she steps she has pain.  She also cut her 2nd toe on the R foot about 1 wk ago and never got stictches but wants me to check it today - it is still sore.   Review of Systems  Musculoskeletal: Positive for gait problem (2nd to pain).  Skin: Positive for wound.       Objective:   Physical Exam  Vitals reviewed. Constitutional: She is oriented to person, place, and time. She appears well-developed and well-nourished.  HENT:  Head: Normocephalic and atraumatic.  Right Ear: External ear normal.  Left Ear: External ear normal.  Pulmonary/Chest: Effort normal and breath sounds normal.  Neurological: She is alert and oriented to person, place, and time.  Skin: Skin is warm.  1- wound on tip of right 2 toe - TTP - well healing wound 2- would on L foot at the 5th MTP joint - TTP  Psychiatric: She has a normal mood and affect. Her behavior is normal. Judgment and thought content normal.   Procedure:  Consent obtained - Local anesthesia with 2% lido with epi.  #11 blade used to make a X shaped incision over the foreign body - 5mm square piece of glass removed from the wound - no other FB felt       Assessment & Plan:   Foreign body in foot, left, initial encounter - wound care d/w pt - daily soaks for the next 2 days and keep it covered to prevent dirt from getting into wound.  Foot pain, left  Benny Lennert [facility] 10/31/2012 11:41 AM

## 2012-12-02 ENCOUNTER — Encounter: Payer: BC Managed Care – PPO | Admitting: Gynecology

## 2012-12-23 ENCOUNTER — Ambulatory Visit (INDEPENDENT_AMBULATORY_CARE_PROVIDER_SITE_OTHER): Payer: BC Managed Care – PPO | Admitting: Gynecology

## 2012-12-23 ENCOUNTER — Encounter: Payer: Self-pay | Admitting: Gynecology

## 2012-12-23 VITALS — BP 124/70 | Ht 64.5 in | Wt 254.0 lb

## 2012-12-23 DIAGNOSIS — L989 Disorder of the skin and subcutaneous tissue, unspecified: Secondary | ICD-10-CM

## 2012-12-23 DIAGNOSIS — Z780 Asymptomatic menopausal state: Secondary | ICD-10-CM

## 2012-12-23 DIAGNOSIS — R635 Abnormal weight gain: Secondary | ICD-10-CM

## 2012-12-23 DIAGNOSIS — N951 Menopausal and female climacteric states: Secondary | ICD-10-CM

## 2012-12-23 DIAGNOSIS — Z01419 Encounter for gynecological examination (general) (routine) without abnormal findings: Secondary | ICD-10-CM

## 2012-12-23 NOTE — Progress Notes (Signed)
Bianca Medina 28-Aug-1950 [id number]   History:    62 y.o.  for annual gyn exam who has not been seen in the office was 2011. Review of patient's records indicated that in 2004 she had a total abdominal hysterectomy for symptomatic leiomyomatous uteri. Pathology report benign. Patient prior to that had tubal ligation. Patient with past history of colon polyps last colonoscopy was in 2007. Patient's last bone density study was normal in 2011. Patient has continued to gain weight since the last time she was seen in the office and is currently weighing 254 pounds. Patient is being followed by Dr. Lupe Carney who does her lab work. Patient has informed me that time she has felt some twinges over her heart but no shortness of breath no tingling or numbness of the arms or neck reported.  Past medical history,surgical history, family history and social history were all reviewed and documented in the EPIC chart.  Gynecologic History No LMP recorded. Patient has had a hysterectomy. Contraception: status post hysterectomy Last Pap: 2011. Results were: normal Last mammogram: 2008. Results were: normal  Obstetric History OB History   Grav Para Term Preterm Abortions TAB SAB Ect Mult Living   3 3        3      # Outc Date GA Lbr Len/2nd Wgt Sex Del Anes PTL Lv   1 PAR            2 PAR            3 PAR                ROS: A ROS was performed and pertinent positives and negatives are included in the history.  GENERAL: No fevers or chills. HEENT: pigmented raised dark lesion on top of her right ear  NECK: No pain or stiffness. CARDIOVASCULAR: No chest pain or pressure. No palpitations. PULMONARY: No shortness of breath, cough or wheeze. GASTROINTESTINAL: No abdominal pain, nausea, vomiting or diarrhea, melena or bright red blood per rectum. GENITOURINARY: No urinary frequency, urgency, hesitancy or dysuria. MUSCULOSKELETAL: No joint or muscle pain, no back pain, no recent trauma. DERMATOLOGIC: No rash, no  itching, no lesions. ENDOCRINE: No polyuria, polydipsia, no heat or cold intolerance. No recent change in weight. HEMATOLOGICAL: No anemia or easy bruising or bleeding. NEUROLOGIC: No headache, seizures, numbness, tingling or weakness. PSYCHIATRIC: No depression, no loss of interest in normal activity or change in sleep pattern.     Exam: chaperone present  BP 124/70  Ht 5' 4.5" (1.638 m)  Wt 254 lb (115.214 kg)  BMI 42.94 kg/m2  Body mass index is 42.94 kg/(m^2).  General appearance : Well developed well nourished female. No acute distress HEENT: Neck supple, trachea midline, no carotid bruits, no thyroidmegaly Lungs: Clear to auscultation, no rhonchi or wheezes, or rib retractions  Heart: Regular rate and rhythm, no murmurs or gallops Breast:Examined in sitting and supine position were symmetrical in appearance, no palpable masses or tenderness,  no skin retraction, no nipple inversion, no nipple discharge, no skin discoloration, no axillary or supraclavicular lymphadenopathy Abdomen: no palpable masses or tenderness, no rebound or guarding Extremities: no edema or skin discoloration or tenderness  Pelvic:  Bartholin, Urethra, Skene Glands: Within normal limits             Vagina: No gross lesions or discharge  Cervix:absent  Uterus absent Adnexa  Without masses or tenderness  Anus and perineum  normal   Rectovaginal  normal sphincter tone without palpated  masses or tenderness             Hemoccult heart provided     Assessment/Plan:  62 y.o. female for annual exam  Who was long overdue for her colonoscopy as well as her mammogram. She will contact her gastroenterologist for followup colonoscopy. I will provide her with Hemoccult cards for testing. She will schedule a bone density study here in our office the next 2 weeks. She will also need to schedule her mammogram which is long overdue. Patient will also be referred to the dermatologist for further evaluation of the incidental  finding of a pigmented lesion on the top of her right. No Pap smear was done today. The new screening guidelines were discussed. She was reminded doing monthly self breast examination. We discussed importance of calcium vitamin D and regular exercise for osteoporosis prevention.    Ok Edwards MD, 11:54 AM 12/23/2012

## 2012-12-23 NOTE — Patient Instructions (Signed)
Shingles Vaccine What You Need to Know WHAT IS SHINGLES?  Shingles is a painful skin rash, often with blisters. It is also called Herpes Zoster or just Zoster.  A shingles rash usually appears on one side of the face or body and lasts from 2 to 4 weeks. Its main symptom is pain, which can be quite severe. Other symptoms of shingles can include fever, headache, chills, and upset stomach. Very rarely, a shingles infection can lead to pneumonia, hearing problems, blindness, brain inflammation (encephalitis), or death.  For about 1 person in 5, severe pain can continue even after the rash clears up. This is called post-herpetic neuralgia.  Shingles is caused by the Varicella Zoster virus. This is the same virus that causes chickenpox. Only someone who has had a case of chickenpox or rarely, has gotten chickenpox vaccine, can get shingles. The virus stays in your body. It can reappear many years later to cause a case of shingles.  You cannot catch shingles from another person with shingles. However, a person who has never had chickenpox (or chickenpox vaccine) could get chickenpox from someone with shingles. This is not very common.  Shingles is far more common in people 50 and older than in younger people. It is also more common in people whose immune systems are weakened because of a disease such as cancer or drugs such as steroids or chemotherapy.  At least 1 million people get shingles per year in the [place]. SHINGLES VACCINE  A vaccine for shingles was licensed in 2006. In clinical trials, the vaccine reduced the risk of shingles by 50%. It can also reduce the pain in people who still get shingles after being vaccinated.  A single dose of shingles vaccine is recommended for adults 60 years of age and older. SOME PEOPLE SHOULD NOT GET SHINGLES VACCINE OR SHOULD WAIT A person should not get shingles vaccine if he or she:  Has ever had a life-threatening allergic reaction to gelatin, the  antibiotic neomycin, or any other component of shingles vaccine. Tell your caregiver if you have any severe allergies.  Has a weakened immune system because of current:  AIDS or another disease that affects the immune system.  Treatment with drugs that affect the immune system, such as prolonged use of high-dose steroids.  Cancer treatment, such as radiation or chemotherapy.  Cancer affecting the bone marrow or lymphatic system, such as leukemia or lymphoma.  Is pregnant, or might be pregnant. Women should not become pregnant until at least 4 weeks after getting shingles vaccine. Someone with a minor illness, such as a cold, may be vaccinated. Anyone with a moderate or severe acute illness should usually wait until he or she recovers before getting the vaccine. This includes anyone with a temperature of 101.3 F (38 C) or higher. WHAT ARE THE RISKS FROM SHINGLES VACCINE?  A vaccine, like any medicine, could possibly cause serious problems, such as severe allergic reactions. However, the risk of a vaccine causing serious harm, or death, is extremely small.  No serious problems have been identified with shingles vaccine. Mild Problems  Redness, soreness, swelling, or itching at the site of the injection (about 1 person in 3).  Headache (about 1 person in 70). Like all vaccines, shingles vaccine is being closely monitored for unusual or severe problems. WHAT IF THERE IS A MODERATE OR SEVERE REACTION? What should I look for? Any unusual condition, such as a severe allergic reaction or a high fever. If a severe allergic reaction   occurred, it would be within a few minutes to an hour after the shot. Signs of a serious allergic reaction can include difficulty breathing, weakness, hoarseness or wheezing, a fast heartbeat, hives, dizziness, paleness, or swelling of the throat. What should I do?  Call your caregiver, or get the person to a caregiver right away.  Tell the caregiver what  happened, the date and time it happened, and when the vaccination was given.  Ask the caregiver to report the reaction by filing a Vaccine Adverse Event Reporting System (VAERS) form. Or, you can file this report through the VAERS web site at www.vaers.hhs.gov or by calling 1-800-822-7967. VAERS does not provide medical advice. HOW CAN I LEARN MORE?  Ask your caregiver. He or she can give you the vaccine package insert or suggest other sources of information.  Contact the Centers for Disease Control and Prevention (CDC):  Call 1-800-232-4636 (1-800-CDC-INFO).  Visit the CDC website at www.cdc.gov/vaccines CDC Shingles Vaccine VIS (02/21/08) Document Released: 03/01/2006 Document Revised: 07/27/2011 Document Reviewed: 02/21/2008 [employer] Patient Information 2014 [employer].  

## 2012-12-26 ENCOUNTER — Encounter: Payer: Self-pay | Admitting: Gynecology

## 2012-12-26 ENCOUNTER — Other Ambulatory Visit: Payer: Self-pay | Admitting: *Deleted

## 2012-12-26 DIAGNOSIS — Z1231 Encounter for screening mammogram for malignant neoplasm of breast: Secondary | ICD-10-CM

## 2012-12-27 ENCOUNTER — Encounter: Payer: BC Managed Care – PPO | Admitting: Gynecology

## 2012-12-28 ENCOUNTER — Ambulatory Visit: Payer: BC Managed Care – PPO

## 2012-12-28 ENCOUNTER — Ambulatory Visit (INDEPENDENT_AMBULATORY_CARE_PROVIDER_SITE_OTHER): Payer: BC Managed Care – PPO | Admitting: Family Medicine

## 2012-12-28 ENCOUNTER — Telehealth: Payer: Self-pay | Admitting: *Deleted

## 2012-12-28 VITALS — BP 132/86 | HR 76 | Temp 98.6°F | Resp 18 | Ht 64.5 in | Wt 260.4 lb

## 2012-12-28 DIAGNOSIS — M549 Dorsalgia, unspecified: Secondary | ICD-10-CM

## 2012-12-28 DIAGNOSIS — M25512 Pain in left shoulder: Secondary | ICD-10-CM

## 2012-12-28 DIAGNOSIS — M542 Cervicalgia: Secondary | ICD-10-CM

## 2012-12-28 DIAGNOSIS — M25519 Pain in unspecified shoulder: Secondary | ICD-10-CM

## 2012-12-28 DIAGNOSIS — M1990 Unspecified osteoarthritis, unspecified site: Secondary | ICD-10-CM

## 2012-12-28 DIAGNOSIS — M129 Arthropathy, unspecified: Secondary | ICD-10-CM

## 2012-12-28 IMAGING — CR DG SHOULDER 2+V*L*
3 series · 3 of 3 positions shown · non-contrast
Comparison: None.

CLINICAL DATA: Neck pain

LEFT SHOULDER - 2+ VIEW

[AP]
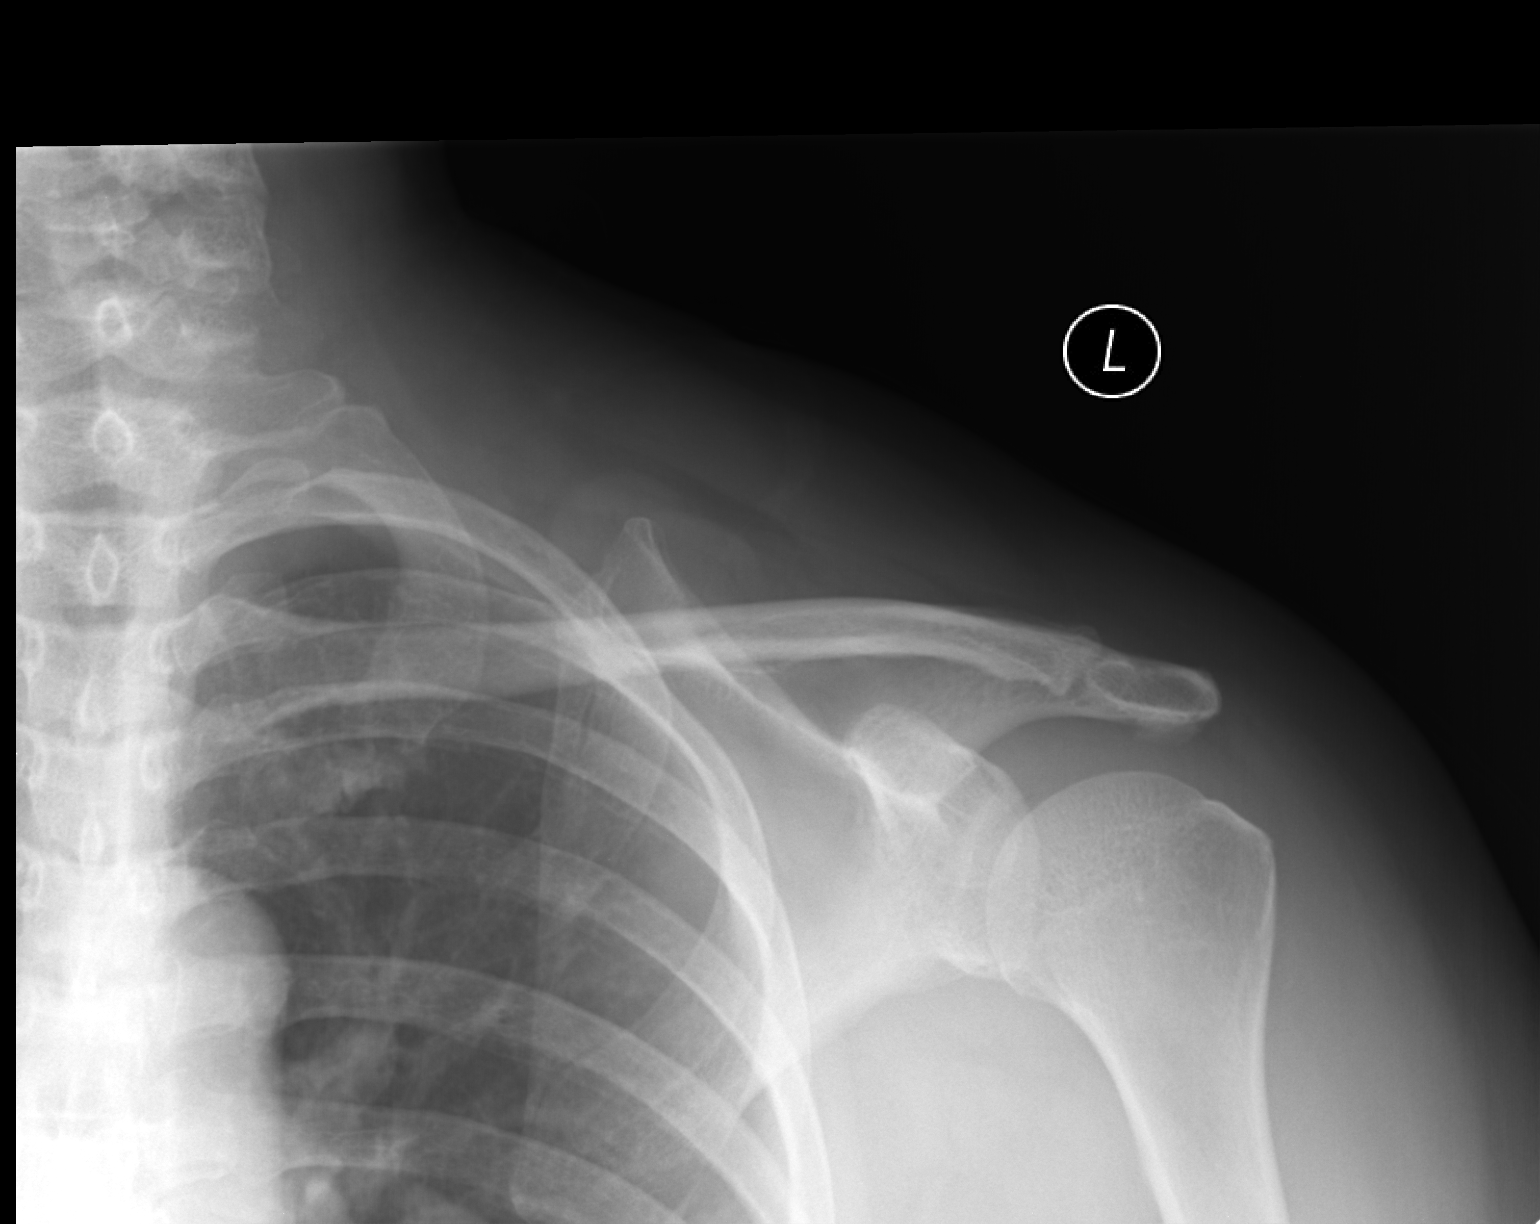

[ap ext rot]
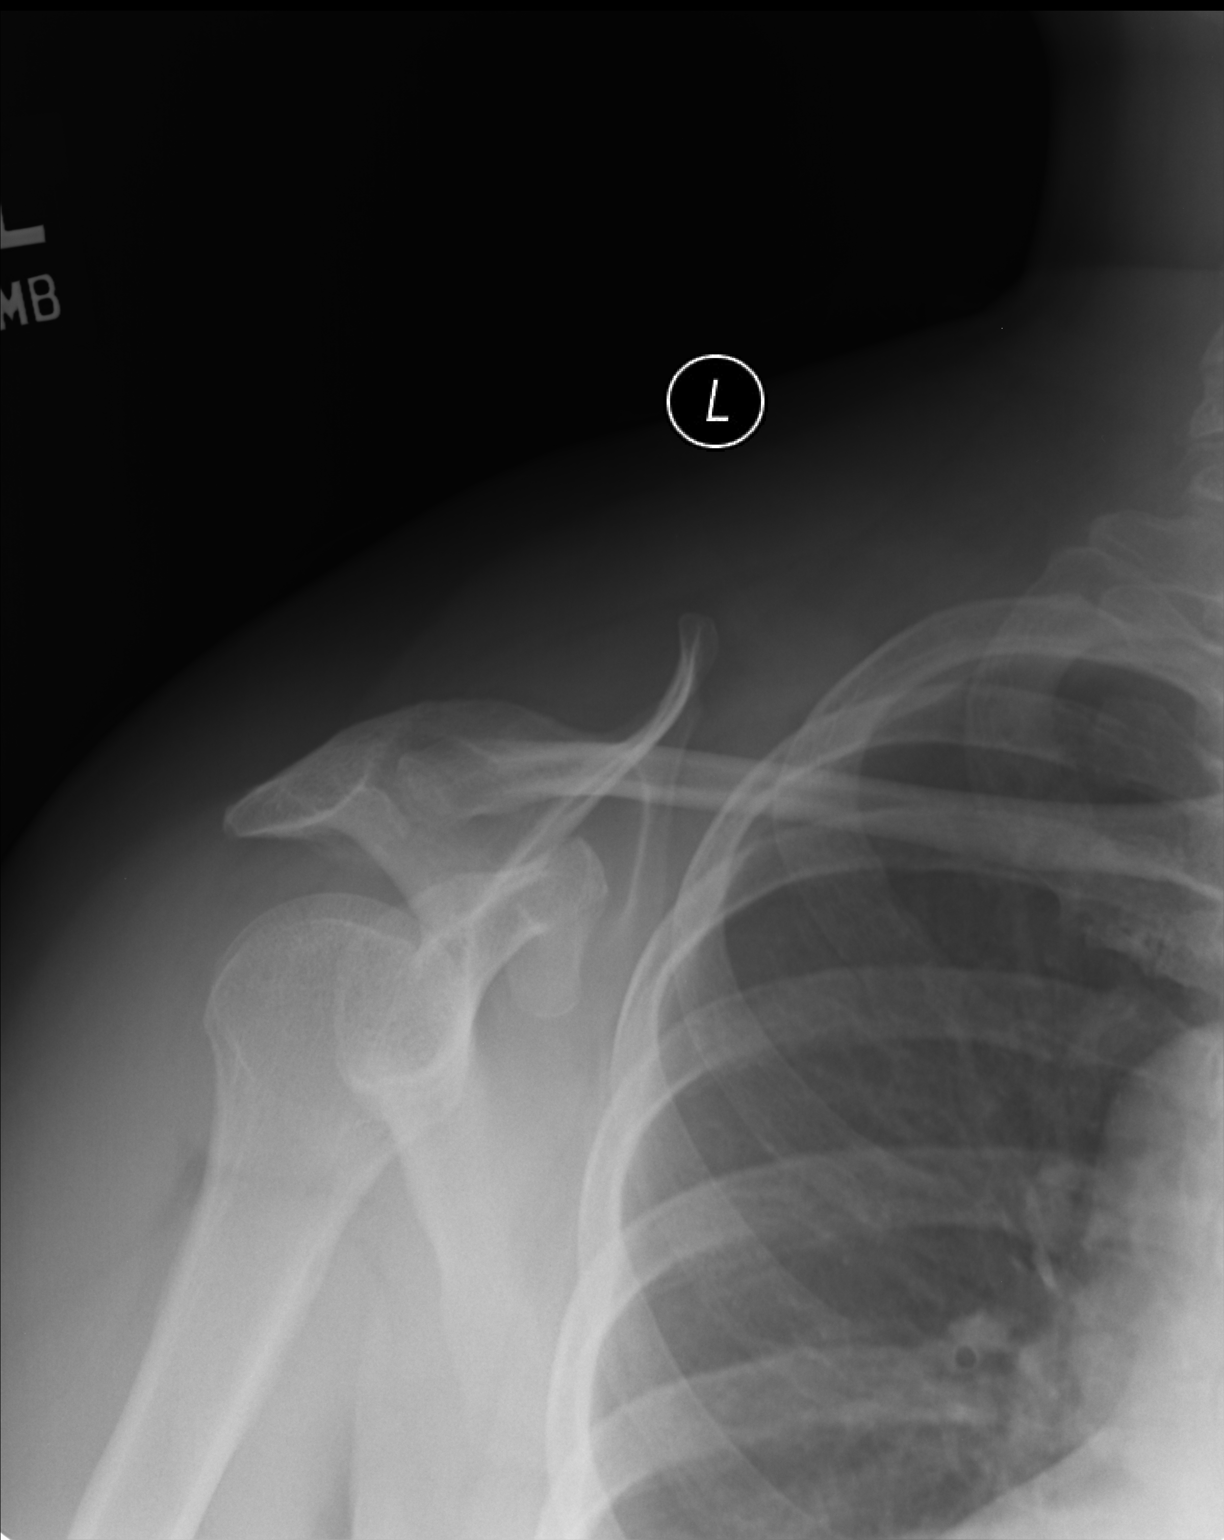

[axial]
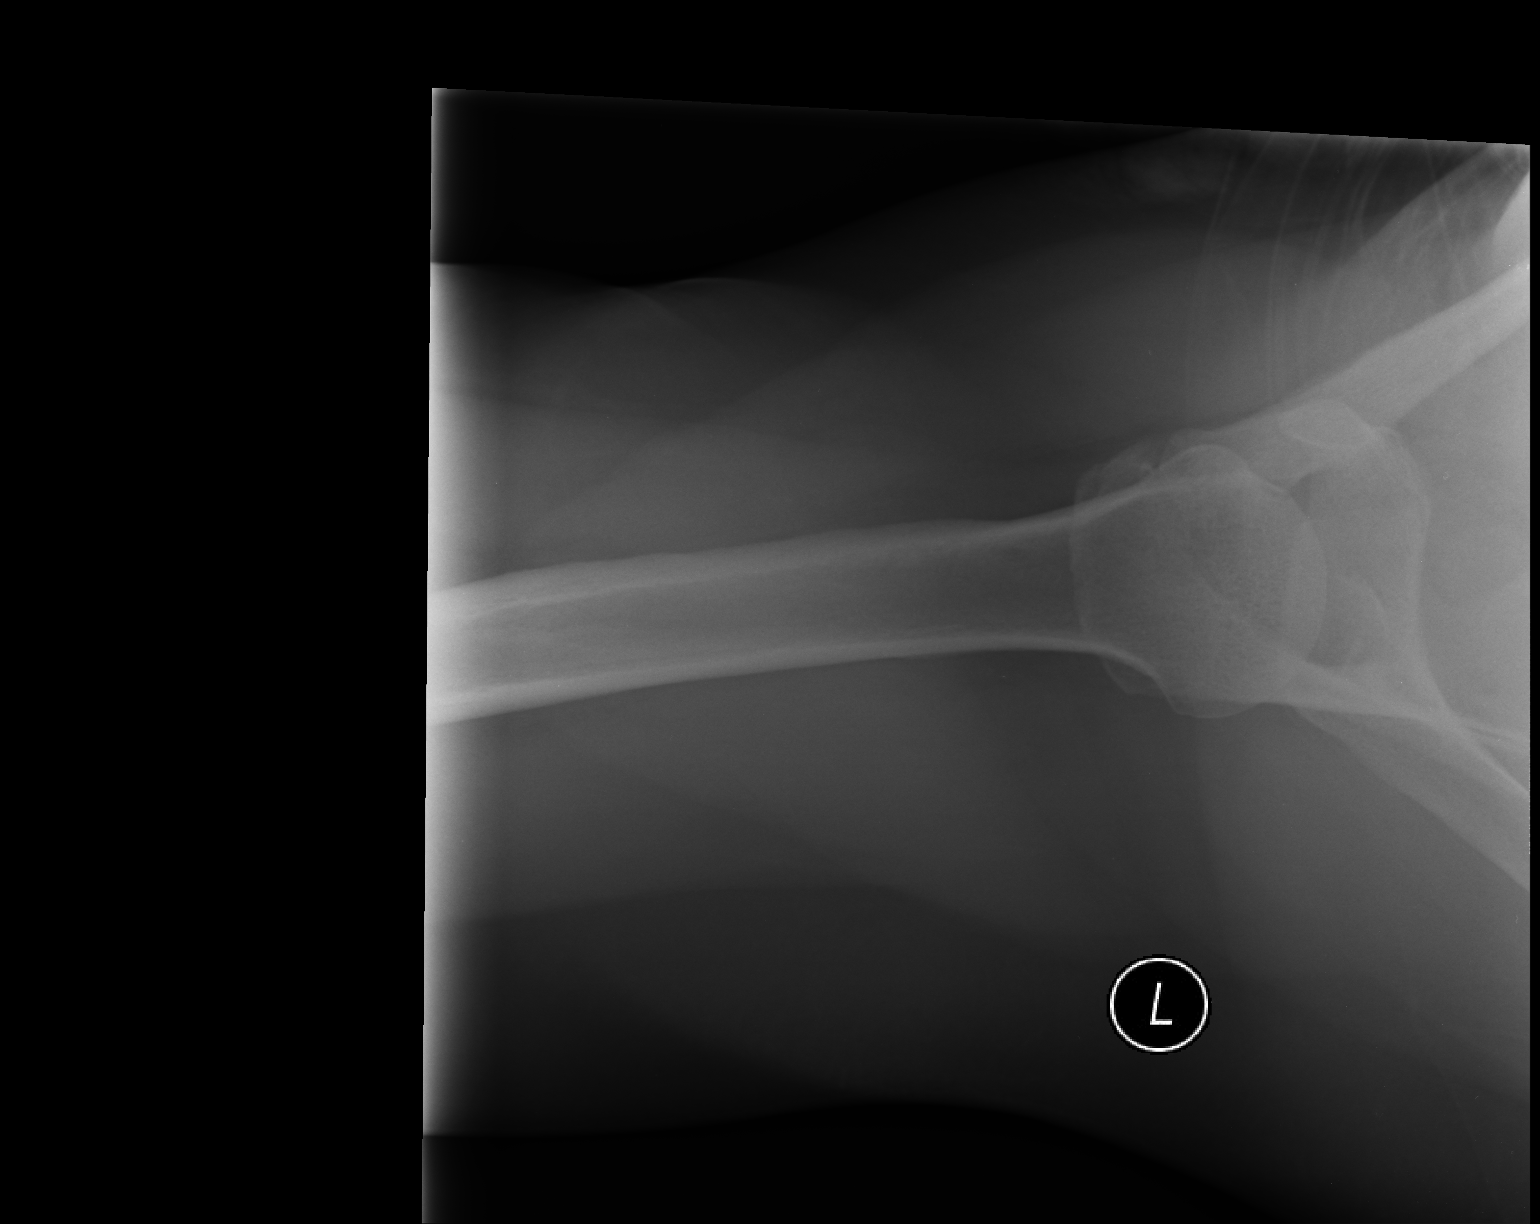

[3 of 3 positions shown; findings below may reference images not displayed]

FINDINGS: No fracture or dislocation.  No soft tissue abnormality.
No radiopaque foreign body.  Left lung apex is clear.
IMPRESSION: Normal exam.

Clinically significant discrepancy from primary report, if
provided: None

## 2012-12-28 IMAGING — CR DG CERVICAL SPINE 2 OR 3 VIEWS
2 series · 2 of 2 positions shown · non-contrast
Comparison: None.

CLINICAL DATA: Neck pain

CERVICAL SPINE - 2-3 VIEW

[lateral]
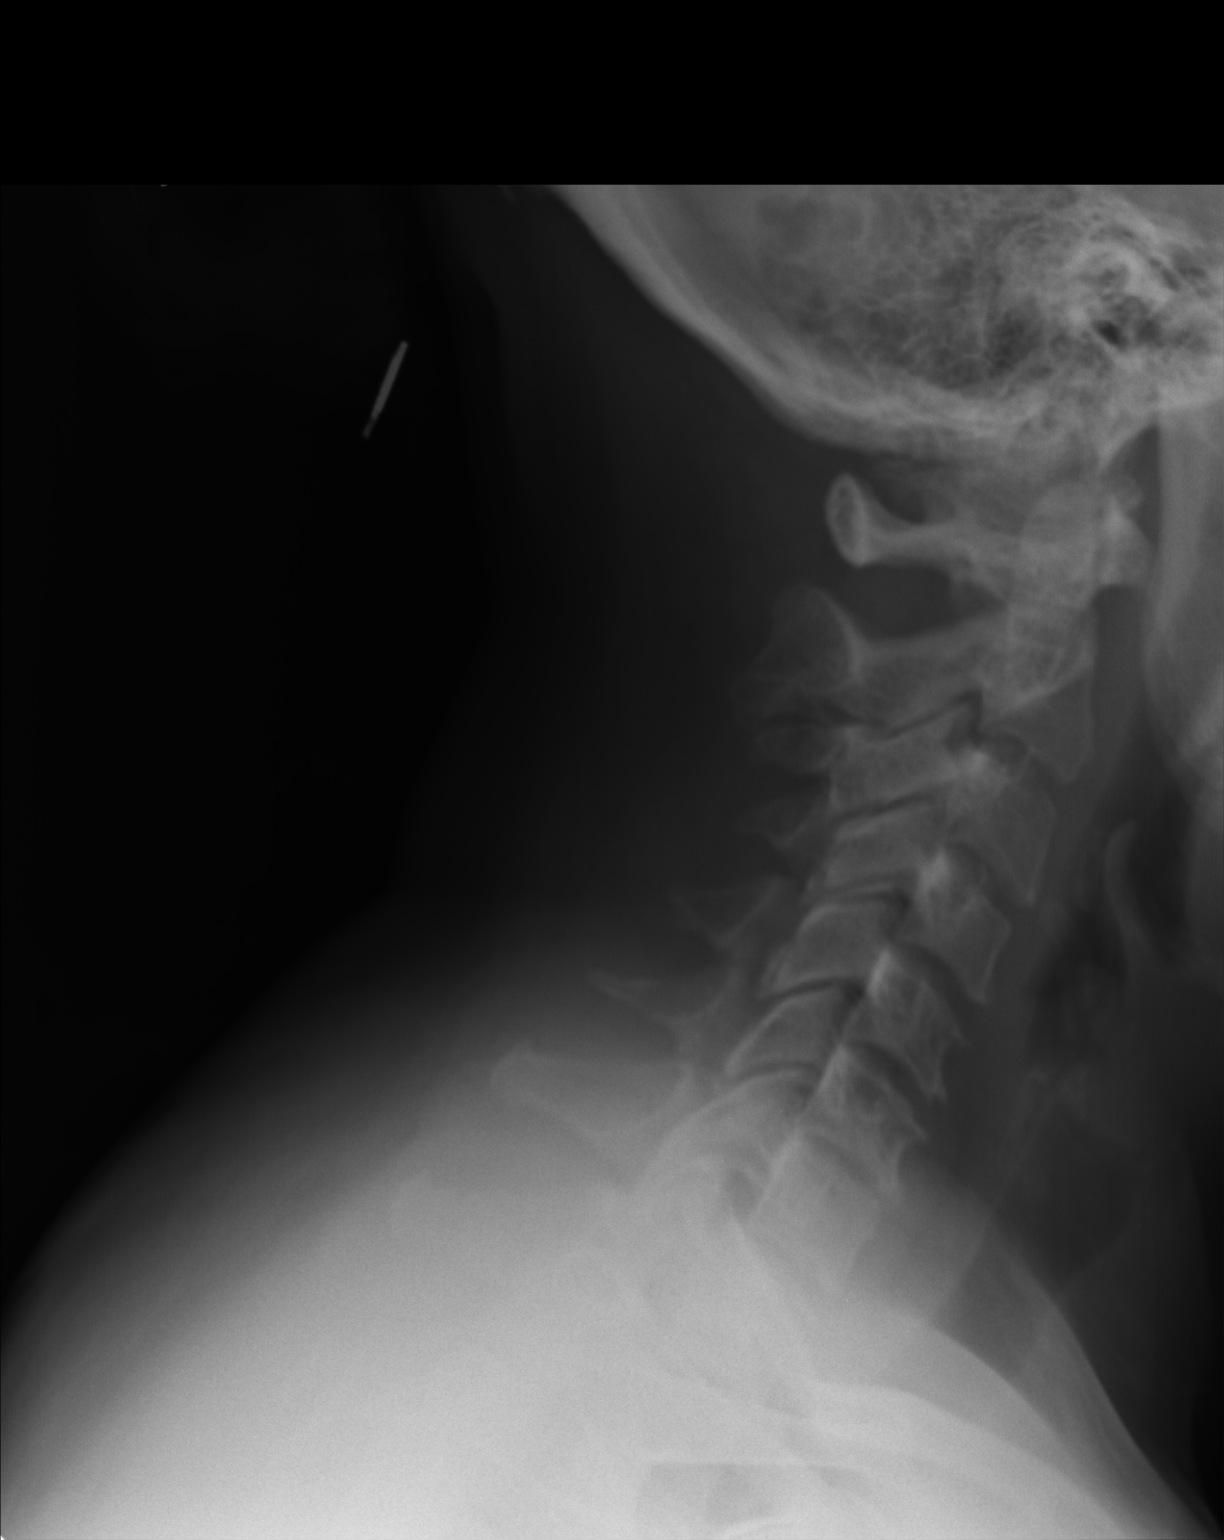

[AP]
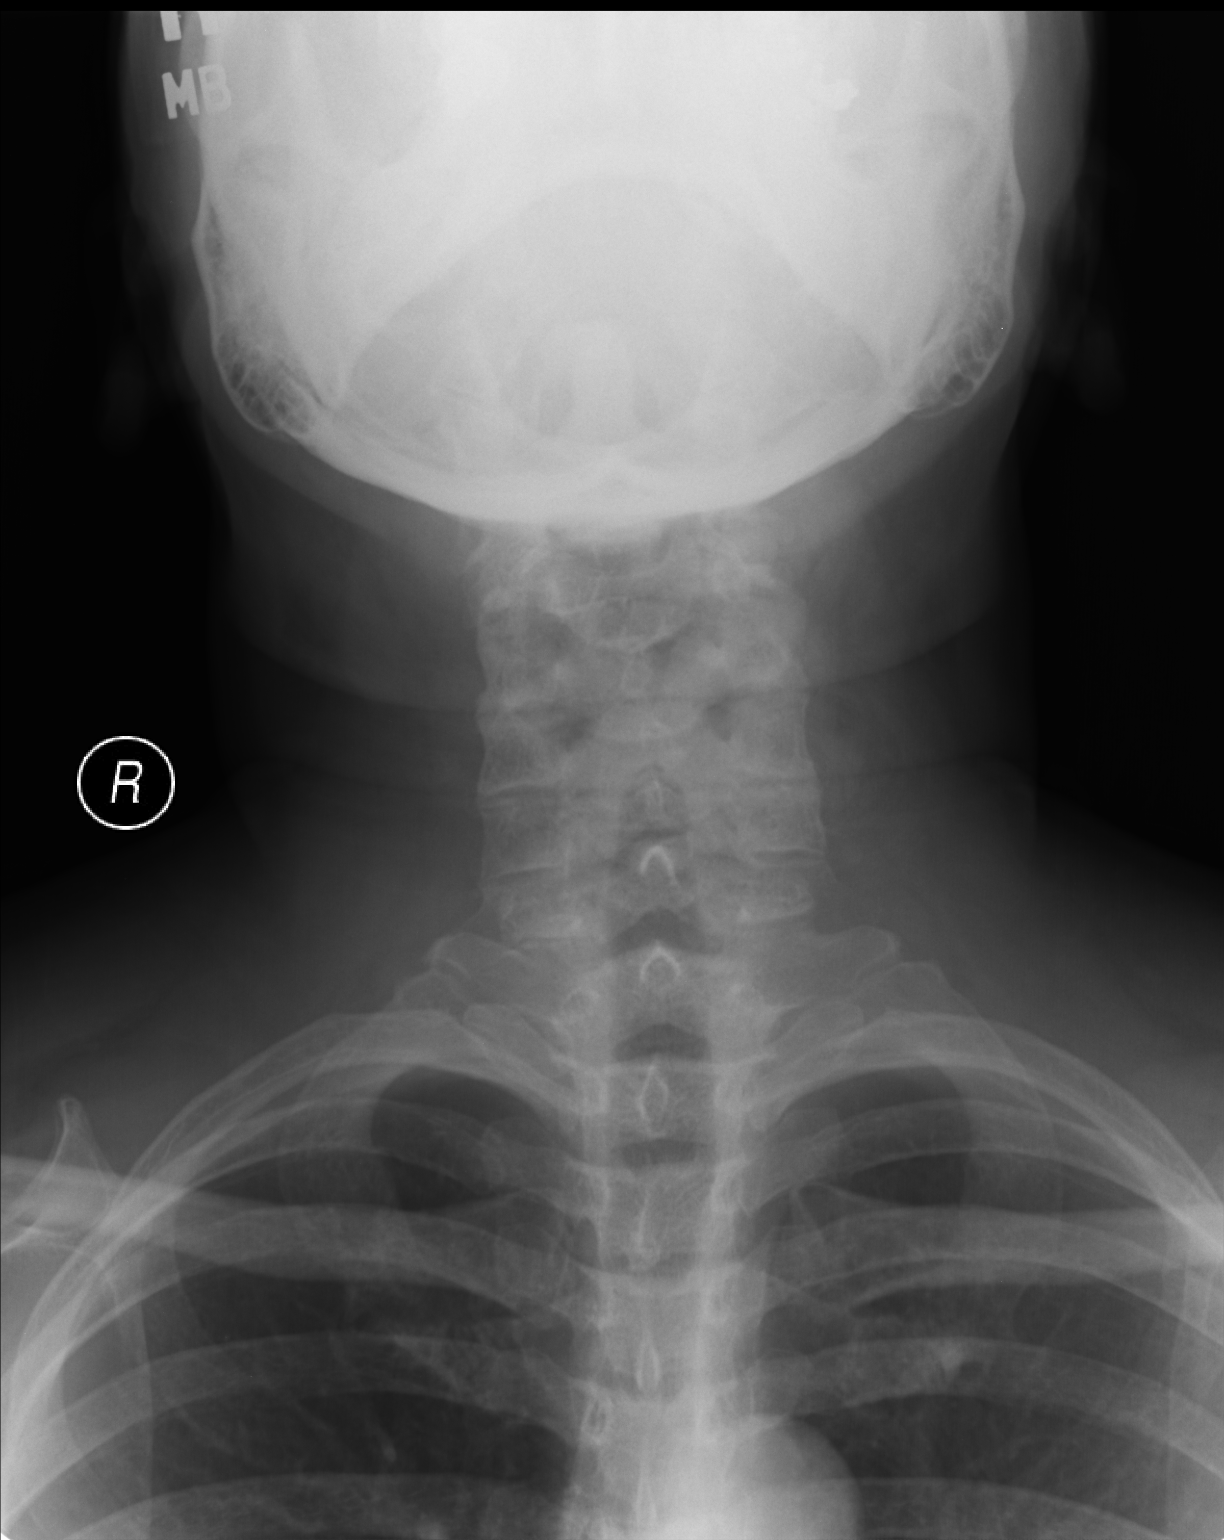

[2 of 2 positions shown; findings below may reference images not displayed]

FINDINGS: Image unsharpness renders visualization suboptimal. C1
through the cervical thoracic junction is visualized in its
entirety. No precervical soft tissue widening is present.  No acute
fracture allowing for two-view technique.  Mild inferior spine disc
degenerative change.  Lung apices are grossly clear.
IMPRESSION: No acute cervical spine finding allowing for two-view technique.

Clinically significant discrepancy from primary report, if
provided: None

## 2012-12-28 MED ORDER — CYCLOBENZAPRINE HCL 5 MG PO TABS: 5.0000 mg | ORAL_TABLET | Freq: Every evening | ORAL | Status: DC | PRN

## 2012-12-28 MED ORDER — MELOXICAM 7.5 MG PO TABS: 7.5000 mg | ORAL_TABLET | Freq: Every day | ORAL | Status: DC

## 2012-12-28 NOTE — Progress Notes (Signed)
[facility]:  Office Visit  Chief Complaint:  Chief Complaint  Patient presents with  . Neck Pain    x 3 weeks; radiates down back and up into head    HPI: Bianca Medina is a 62 y.o. female who complains of  Left neck pain, radiates from back of neck and to back Piercing pain, Wind makes it hurt, feels Tight and swollen Started in left neck, trapezius and goes up to back of head and down shoulder blade She has had shingles before about 4 years ago along her right groin area She had shingles injection last week, No new  Rashes Deneis f/c/n/v/abd pain, CP, SOB  Past Medical History  Diagnosis Date  . Asthma   . NSVD (normal spontaneous vaginal delivery)     X3  . GERD (gastroesophageal reflux disease)   . PMDD (premenstrual dysphoric disorder)   . Anemia 2003    IRON DEFICIENCY   . Hypertension   . Sleep disorder   . Hyperlipidemia, mild   . Obesity    Past Surgical History  Procedure Laterality Date  . Abdominal hysterectomy    . Tubal ligation     History   Social History  . Marital Status: Single    Spouse Name: N/A    Number of Children: N/A  . Years of Education: N/A   Social History Main Topics  . Smoking status: Never Smoker   . Smokeless tobacco: None  . Alcohol Use: No  . Drug Use: No  . Sexual Activity: Yes    Birth Control/ Protection: Surgical     Comment: HYSTERECTOMY   Other Topics Concern  . None   Social History Narrative  . None   Family History  Problem Relation Age of Onset  . Cancer Mother     LUNG- SMOKER  . Diabetes Father   . Hypertension Father   . Heart disease Father   . Cancer Paternal Aunt     COLON  . Cancer Paternal Uncle     COLON   Allergies  Allergen Reactions  . Codeine Other (See Comments)    nightmares   Prior to Admission medications   Medication Sig Start Date End Date Taking? Authorizing Provider  Esomeprazole Magnesium (NEXIUM PO) Take by mouth as needed.   Yes Historical  Provider, MD  potassium chloride (K-DUR) 10 MEQ tablet Take 10 mEq by mouth 2 (two) times daily.   Yes Historical Provider, MD  triamterene-hydrochlorothiazide (DYAZIDE) 37.5-25 MG per capsule Take 1 capsule by mouth every morning.   Yes Historical Provider, MD     ROS: The patient denies fevers, chills, night sweats, unintentional weight loss, chest pain, palpitations, wheezing, dyspnea on exertion, nausea, vomiting, abdominal pain, dysuria, hematuria, melena,   All other systems have been reviewed and were otherwise negative with the exception of those mentioned in the HPI and as above.    PHYSICAL EXAM: Filed Vitals:   12/28/12 1849  BP: 132/86  Pulse: 76  Temp: 98.6 F (37 C)  Resp: 18   Filed Vitals:   12/28/12 1849  Height: 5' 4.5" (1.638 m)  Weight: 260 lb 6.4 oz (118.117 kg)   Body mass index is 44.02 kg/(m^2).  General: Alert, no acute distress HEENT:  Normocephalic, atraumatic, oropharynx patent.  Cardiovascular:  Regular rate and rhythm, no rubs murmurs or gallops.  No Carotid bruits, radial pulse intact. No pedal edema.  Respiratory: Clear to auscultation bilaterally.  No wheezes, rales, or rhonchi.  No cyanosis, no use of accessory musculature GI: No organomegaly, abdomen is soft and non-tender, positive bowel sounds.  No masses. Skin: No rashes. Neurologic: Facial musculature symmetric. Psychiatric: Patient is appropriate throughout our interaction. Lymphatic: No cervical lymphadenopathy Musculoskeletal: Gait intact. Neck-tender along para spinal msk, trapezius, neg spurling Left shoulder-pain with ER/IR,  Other full ROM, neg Hawkins/NEERs, tender AC jt 5/5 stregnth,    LABS: Results for orders placed in visit on 07/20/12  CBC AND DIFFERENTIAL      Result Value Range   Hemoglobin 13.1  12.0 - 16.0 g/dL   HCT 39  36 - 46 %   Platelets 249  150 - 399 K/L   WBC 9.6    BASIC METABOLIC PANEL      Result Value Range   Glucose 84     BUN 13  4 - 21 mg/dL    Creatinine 0.8  0.5 - 1.1 mg/dL   Potassium 4.3  3.4 - 5.3 mmol/L   Sodium 141  137 - 147 mmol/L  LIPID PANEL      Result Value Range   LDl/HDL Ratio 3.3     Triglycerides 48  40 - 160 mg/dL   Cholesterol 811  0 - 200 mg/dL   HDL 48  35 - 70 mg/dL   LDL Cholesterol 914    HEPATIC FUNCTION PANEL      Result Value Range   Alkaline Phosphatase 89  25 - 125 U/L   ALT 14  7 - 35 U/L   AST 13  13 - 35 U/L   Bilirubin, Total 0.6    HEMOGLOBIN A1C      Result Value Range   Hemoglobin A1C 6.2 (*) 4.0 - 6.0 %  TSH      Result Value Range   TSH 0.79  0.41 - 5.90 uIU/mL  HM COLONOSCOPY      Result Value Range   HM Colonoscopy [facility]       EKG/XRAY:   Primary read interpreted by Dr. Conley Rolls at [facility]. C spine-+ djd, no fractures/dislocation Shoulder-minimal AC jt narroing, otherwise no fx/dislocation   ASSESSMENT/PLAN: Encounter Diagnoses  Name Primary?  . Neck pain Yes  . Back pain   . Pain in joint, shoulder region, left   . Arthritis    Most likely msk in origin, I do not think this is shingles or herpetic neuralgia  But gave precautions Rx Mobic and flexeril F/u prn in 4-6 weeks or sooner prn Gross sideeffects, risk and benefits, and alternatives of medications d/w patient. Patient is aware that all medications have potential sideeffects and we are unable to predict every sideeffect or drug-drug interaction that may occur.     Rhianon Zabawa PHUONG, DO 12/28/2012 8:21 PM

## 2012-12-28 NOTE — Telephone Encounter (Signed)
Pt called to let office aware she received her shingles vaccination on Monday 12/26/12

## 2012-12-28 NOTE — Patient Instructions (Signed)
Shoulder Exercises EXERCISES  RANGE OF MOTION (ROM) AND STRETCHING EXERCISES These exercises may help you when beginning to rehabilitate your injury. Your symptoms may resolve with or without further involvement from your physician, physical therapist or athletic trainer. While completing these exercises, remember:   Restoring tissue flexibility helps normal motion to return to the joints. This allows healthier, less painful movement and activity.  An effective stretch should be held for at least 30 seconds.  A stretch should never be painful. You should only feel a gentle lengthening or release in the stretched tissue. ROM - Pendulum  Bend at the waist so that your right / left arm falls away from your body. Support yourself with your opposite hand on a solid surface, such as a table or a countertop.  Your right / left arm should be perpendicular to the ground. If it is not perpendicular, you need to lean over farther. Relax the muscles in your right / left arm and shoulder as much as possible.  Gently sway your hips and trunk so they move your right / left arm without any use of your right / left shoulder muscles.  Progress your movements so that your right / left arm moves side to side, then forward and backward, and finally, both clockwise and counterclockwise.  Complete __________ repetitions in each direction. Many people use this exercise to relieve discomfort in their shoulder as well as to gain range of motion. Repeat __________ times. Complete this exercise __________ times per day. STRETCH  Flexion, Standing  Stand with good posture. With an underhand grip on your right / left hand and an overhand grip on the opposite hand, grasp a broomstick or cane so that your hands are a little more than shoulder-width apart.  Keeping your right / left elbow straight and shoulder muscles relaxed, push the stick with your opposite hand to raise your right / left arm in front of your body and  then overhead. Raise your arm until you feel a stretch in your right / left shoulder, but before you have increased shoulder pain.  Try to avoid shrugging your right / left shoulder as your arm rises by keeping your shoulder blade tucked down and toward your mid-back spine. Hold __________ seconds.  Slowly return to the starting position. Repeat __________ times. Complete this exercise __________ times per day. STRETCH - Internal Rotation  Place your right / left hand behind your back, palm-up.  Throw a towel or belt over your opposite shoulder. Grasp the towel/belt with your right / left hand.  While keeping an upright posture, gently pull up on the towel/belt until you feel a stretch in the front of your right / left shoulder.  Avoid shrugging your right / left shoulder as your arm rises by keeping your shoulder blade tucked down and toward your mid-back spine.  Hold __________. Release the stretch by lowering your opposite hand. Repeat __________ times. Complete this exercise __________ times per day. STRETCH - External Rotation and Abduction  Stagger your stance through a doorframe. It does not matter which foot is forward.  As instructed by your physician, physical therapist or athletic trainer, place your hands:  And forearms above your head and on the door frame.  And forearms at head-height and on the door frame.  At elbow-height and on the door frame.  Keeping your head and chest upright and your stomach muscles tight to prevent over-extending your low-back, slowly shift your weight onto your front foot until you feel   a stretch across your chest and/or in the front of your shoulders.  Hold __________ seconds. Shift your weight to your back foot to release the stretch. Repeat __________ times. Complete this stretch __________ times per day.  STRENGTHENING EXERCISES  These exercises may help you when beginning to rehabilitate your injury. They may resolve your symptoms with  or without further involvement from your physician, physical therapist or athletic trainer. While completing these exercises, remember:   Muscles can gain both the endurance and the strength needed for everyday activities through controlled exercises.  Complete these exercises as instructed by your physician, physical therapist or athletic trainer. Progress the resistance and repetitions only as guided.  You may experience muscle soreness or fatigue, but the pain or discomfort you are trying to eliminate should never worsen during these exercises. If this pain does worsen, stop and make certain you are following the directions exactly. If the pain is still present after adjustments, discontinue the exercise until you can discuss the trouble with your clinician.  If advised by your physician, during your recovery, avoid activity or exercises which involve actions that place your right / left hand or elbow above your head or behind your back or head. These positions stress the tissues which are trying to heal. STRENGTH - Scapular Depression and Adduction  With good posture, sit on a firm chair. Supported your arms in front of you with pillows, arm rests or a table top. Have your elbows in line with the sides of your body.  Gently draw your shoulder blades down and toward your mid-back spine. Gradually increase the tension without tensing the muscles along the top of your shoulders and the back of your neck.  Hold for __________ seconds. Slowly release the tension and relax your muscles completely before completing the next repetition.  After you have practiced this exercise, remove the arm support and complete it in standing as well as sitting. Repeat __________ times. Complete this exercise __________ times per day.  STRENGTH - External Rotators  Secure a rubber exercise band/tubing to a fixed object so that it is at the same height as your right / left elbow when you are standing or sitting on a  firm surface.  Stand or sit so that the secured exercise band/tubing is at your side that is not injured.  Bend your elbow 90 degrees. Place a folded towel or small pillow under your right / left arm so that your elbow is a few inches away from your side.  Keeping the tension on the exercise band/tubing, pull it away from your body, as if pivoting on your elbow. Be sure to keep your body steady so that the movement is only coming from your shoulder rotating.  Hold __________ seconds. Release the tension in a controlled manner as you return to the starting position. Repeat __________ times. Complete this exercise __________ times per day.  STRENGTH - Supraspinatus  Stand or sit with good posture. Grasp a __________ weight or an exercise band/tubing so that your hand is "thumbs-up," like when you shake hands.  Slowly lift your right / left hand from your thigh into the air, traveling about 30 degrees from straight out at your side. Lift your hand to shoulder height or as far as you can without increasing any shoulder pain. Initially, many people do not lift their hands above shoulder height.  Avoid shrugging your right / left shoulder as your arm rises by keeping your shoulder blade tucked down and toward your   mid-back spine.  Hold for __________ seconds. Control the descent of your hand as you slowly return to your starting position. Repeat __________ times. Complete this exercise __________ times per day.  STRENGTH - Shoulder Extensors  Secure a rubber exercise band/tubing so that it is at the height of your shoulders when you are either standing or sitting on a firm arm-less chair.  With a thumbs-up grip, grasp an end of the band/tubing in each hand. Straighten your elbows and lift your hands straight in front of you at shoulder height. Step back away from the secured end of band/tubing until it becomes tense.  Squeezing your shoulder blades together, pull your hands down to the sides of  your thighs. Do not allow your hands to go behind you.  Hold for __________ seconds. Slowly ease the tension on the band/tubing as you reverse the directions and return to the starting position. Repeat __________ times. Complete this exercise __________ times per day.  STRENGTH - Scapular Retractors  Secure a rubber exercise band/tubing so that it is at the height of your shoulders when you are either standing or sitting on a firm arm-less chair.  With a palm-down grip, grasp an end of the band/tubing in each hand. Straighten your elbows and lift your hands straight in front of you at shoulder height. Step back away from the secured end of band/tubing until it becomes tense.  Squeezing your shoulder blades together, draw your elbows back as you bend them. Keep your upper arm lifted away from your body throughout the exercise.  Hold __________ seconds. Slowly ease the tension on the band/tubing as you reverse the directions and return to the starting position. Repeat __________ times. Complete this exercise __________ times per day. STRENGTH  Scapular Depressors  Find a sturdy chair without wheels, such as a from a dining room table.  Keeping your feet on the floor, lift your bottom from the seat and lock your elbows.  Keeping your elbows straight, allow gravity to pull your body weight down. Your shoulders will rise toward your ears.  Raise your body against gravity by drawing your shoulder blades down your back, shortening the distance between your shoulders and ears. Although your feet should always maintain contact with the floor, your feet should progressively support less body weight as you get stronger.  Hold __________ seconds. In a controlled and slow manner, lower your body weight to begin the next repetition. Repeat __________ times. Complete this exercise __________ times per day.  Document Released: 03/18/2005 Document Revised: 07/27/2011 Document Reviewed: 08/16/2008 [facility]  Patient Information 2014 [place], [place]. Back Exercises Back exercises help treat and prevent back injuries. The goal of back exercises is to increase the strength of your abdominal and back muscles and the flexibility of your back. These exercises should be started when you no longer have back pain. Back exercises include:  Pelvic Tilt. Lie on your back with your knees bent. Tilt your pelvis until the lower part of your back is against the floor. Hold this position 5 to 10 sec and repeat 5 to 10 times.  Knee to Chest. Pull first 1 knee up against your chest and hold for 20 to 30 seconds, repeat this with the other knee, and then both knees. This may be done with the other leg straight or bent, whichever feels better.  Sit-Ups or Curl-Ups. Bend your knees 90 degrees. Start with tilting your pelvis, and do a partial, slow sit-up, lifting your trunk only 30 to 45 degrees  off the floor. Take at least 2 to 3 seconds for each sit-up. Do not do sit-ups with your knees out straight. If partial sit-ups are difficult, simply do the above but with only tightening your abdominal muscles and holding it as directed.  Hip-Lift. Lie on your back with your knees flexed 90 degrees. Push down with your feet and shoulders as you raise your hips a couple inches off the floor; hold for 10 seconds, repeat 5 to 10 times.  Back arches. Lie on your stomach, propping yourself up on bent elbows. Slowly press on your hands, causing an arch in your low back. Repeat 3 to 5 times. Any initial stiffness and discomfort should lessen with repetition over time.  Shoulder-Lifts. Lie face down with arms beside your body. Keep hips and torso pressed to floor as you slowly lift your head and shoulders off the floor. Do not overdo your exercises, especially in the beginning. Exercises may cause you some mild back discomfort which lasts for a few minutes; however, if the pain is more severe, or lasts for more than 15 minutes, do not continue  exercises until you see your caregiver. Improvement with exercise therapy for back problems is slow.  See your caregivers for assistance with developing a proper back exercise program. Document Released: 06/11/2004 Document Revised: 07/27/2011 Document Reviewed: 03/05/2011 [facility] Patient Information 2014 [place], [place].

## 2013-01-26 ENCOUNTER — Other Ambulatory Visit: Payer: BC Managed Care – PPO | Admitting: Anesthesiology

## 2013-01-26 DIAGNOSIS — Z1211 Encounter for screening for malignant neoplasm of colon: Secondary | ICD-10-CM

## 2013-04-24 ENCOUNTER — Telehealth: Payer: Self-pay | Admitting: *Deleted

## 2013-04-24 NOTE — Telephone Encounter (Signed)
Pt called stating she was unable to schedule colonoscopy, dr. Loreta Ave told her she is not due until another 2 years. And c/o issues with stool. I left on voicemail that OV with GI MD would be best. I told her to call if questions.

## 2013-05-09 ENCOUNTER — Other Ambulatory Visit: Payer: Self-pay | Admitting: Gynecology

## 2013-05-09 ENCOUNTER — Ambulatory Visit (INDEPENDENT_AMBULATORY_CARE_PROVIDER_SITE_OTHER): Payer: BC Managed Care – PPO

## 2013-05-09 DIAGNOSIS — Z780 Asymptomatic menopausal state: Secondary | ICD-10-CM

## 2013-05-09 DIAGNOSIS — Z13820 Encounter for screening for osteoporosis: Secondary | ICD-10-CM

## 2013-07-05 ENCOUNTER — Ambulatory Visit (INDEPENDENT_AMBULATORY_CARE_PROVIDER_SITE_OTHER): Payer: BC Managed Care – PPO | Admitting: Family Medicine

## 2013-07-05 VITALS — BP 122/64 | HR 64 | Temp 98.1°F | Resp 16 | Ht 65.0 in | Wt 260.4 lb

## 2013-07-05 DIAGNOSIS — R519 Headache, unspecified: Secondary | ICD-10-CM

## 2013-07-05 DIAGNOSIS — R51 Headache: Secondary | ICD-10-CM

## 2013-07-05 NOTE — Progress Notes (Signed)
   Subjective:    Patient ID: Bianca Medina, female    DOB: [birth year], 63 y.o.   MRN: [id number]  HPI Patient presents for headaches. These have been becoming more frequent for last 1.5 years. Last week had associated red eye with the headache for the first time. The headaches happen more frequently at work. When she gets headaches she gets increasing movement of "floater" in eye and has trouble with vision and cognition. Floater is always there but seems to move when she gets headache.  Headaches have been frequent for the last 1.5 year. Having headache 5/7 days per week now. Sometimes gets associated dizziness with headache and also blurry vision. Resolves at times when she gets home from work spontaneously or with advil. No nausea, does have some light sensitivity. Eyes checked in august. She states that last week there were three times where her eyes turned bloodshot red.  No balance problems. No history of stroke, heart attack, vascular disease. BP usually well controlled. No balance, no peripheral sensory changes or weakness, no speech problems, and not facial asymmetry.   Review of Systems     Objective:   Physical Exam  Constitutional: She is oriented to person, place, and time. She appears well-developed and well-nourished. No distress.  HENT:  Head: Normocephalic and atraumatic.  Eyes: Pupils are equal, round, and reactive to light.  Neck: Normal range of motion.  Cardiovascular: Normal rate, regular rhythm and normal heart sounds.   Pulmonary/Chest: Effort normal and breath sounds normal.  Musculoskeletal: Normal range of motion.  Lymphadenopathy:    She has no cervical adenopathy.  Neurological: She is alert and oriented to person, place, and time. No cranial nerve deficit or sensory deficit. She exhibits normal muscle tone. She displays a negative Romberg sign. Coordination and gait normal.  Skin: Skin is warm and dry. She is not diaphoretic.  Psychiatric: She has a normal mood  and affect. Her behavior is normal.  No carotid bruit No palpable increased IOP     Assessment & Plan:  #1. Recurrent and increasing HA x 1.5 yrs - CT head to r/o intracranial pathology - Patient to call ophtho tomorrow to r/o glaucoma - Refer to neurology.

## 2013-07-05 NOTE — Patient Instructions (Signed)
Thank you for coming in today  1. Refer for CT scan of your head 2. Call your ophthalmologist tomorrow to check for glaucoma 3. Referral to neurology  Headaches, Frequently Asked Questions MIGRAINE HEADACHES Q: What is migraine? What causes it? How can I treat it? A: Generally, migraine headaches begin as a dull ache. Then they develop into a constant, throbbing, and pulsating pain. You may experience pain at the temples. You may experience pain at the front or back of one or both sides of the head. The pain is usually accompanied by a combination of:  Nausea.  Vomiting.  Sensitivity to light and noise. Some people (about 15%) experience an aura (see below) before an attack. The cause of migraine is believed to be chemical reactions in the brain. Treatment for migraine may include over-the-counter or prescription medications. It may also include self-help techniques. These include relaxation training and biofeedback.  Q: What is an aura? A: About 15% of people with migraine get an "aura". This is a sign of neurological symptoms that occur before a migraine headache. You may see wavy or jagged lines, dots, or flashing lights. You might experience tunnel vision or blind spots in one or both eyes. The aura can include visual or auditory hallucinations (something imagined). It may include disruptions in smell (such as strange odors), taste or touch. Other symptoms include:  Numbness.  A "pins and needles" sensation.  Difficulty in recalling or speaking the correct word. These neurological events may last as long as 60 minutes. These symptoms will fade as the headache begins. Q: What is a trigger? A: Certain physical or environmental factors can lead to or "trigger" a migraine. These include:  Foods.  Hormonal changes.  Weather.  Stress. It is important to remember that triggers are different for everyone. To help prevent migraine attacks, you need to figure out which triggers affect  you. Keep a headache diary. This is a good way to track triggers. The diary will help you talk to your healthcare professional about your condition. Q: Does weather affect migraines? A: Bright sunshine, hot, humid conditions, and drastic changes in barometric pressure may lead to, or "trigger," a migraine attack in some people. But studies have shown that weather does not act as a trigger for everyone with migraines. Q: What is the link between migraine and hormones? A: Hormones start and regulate many of your body's functions. Hormones keep your body in balance within a constantly changing environment. The levels of hormones in your body are unbalanced at times. Examples are during menstruation, pregnancy, or menopause. That can lead to a migraine attack. In fact, about three quarters of all women with migraine report that their attacks are related to the menstrual cycle.  Q: Is there an increased risk of stroke for migraine sufferers? A: The likelihood of a migraine attack causing a stroke is very remote. That is not to say that migraine sufferers cannot have a stroke associated with their migraines. In persons under age 107, the most common associated factor for stroke is migraine headache. But over the course of a person's normal life span, the occurrence of migraine headache may actually be associated with a reduced risk of dying from cerebrovascular disease due to stroke.  Q: What are acute medications for migraine? A: Acute medications are used to treat the pain of the headache after it has started. Examples over-the-counter medications, NSAIDs, ergots, and triptans.  Q: What are the triptans? A: Triptans are the newest class of abortive  medications. They are specifically targeted to treat migraine. Triptans are vasoconstrictors. They moderate some chemical reactions in the brain. The triptans work on receptors in your brain. Triptans help to restore the balance of a neurotransmitter called  serotonin. Fluctuations in levels of serotonin are thought to be a main cause of migraine.  Q: Are over-the-counter medications for migraine effective? A: Over-the-counter, or "OTC," medications may be effective in relieving mild to moderate pain and associated symptoms of migraine. But you should see your caregiver before beginning any treatment regimen for migraine.  Q: What are preventive medications for migraine? A: Preventive medications for migraine are sometimes referred to as "prophylactic" treatments. They are used to reduce the frequency, severity, and length of migraine attacks. Examples of preventive medications include antiepileptic medications, antidepressants, beta-blockers, calcium channel blockers, and NSAIDs (nonsteroidal anti-inflammatory drugs). Q: Why are anticonvulsants used to treat migraine? A: During the past few years, there has been an increased interest in antiepileptic drugs for the prevention of migraine. They are sometimes referred to as "anticonvulsants". Both epilepsy and migraine may be caused by similar reactions in the brain.  Q: Why are antidepressants used to treat migraine? A: Antidepressants are typically used to treat people with depression. They may reduce migraine frequency by regulating chemical levels, such as serotonin, in the brain.  Q: What alternative therapies are used to treat migraine? A: The term "alternative therapies" is often used to describe treatments considered outside the scope of conventional Western medicine. Examples of alternative therapy include acupuncture, acupressure, and yoga. Another common alternative treatment is herbal therapy. Some herbs are believed to relieve headache pain. Always discuss alternative therapies with your caregiver before proceeding. Some herbal products contain arsenic and other toxins. TENSION HEADACHES Q: What is a tension-type headache? What causes it? How can I treat it? A: Tension-type headaches occur  randomly. They are often the result of temporary stress, anxiety, fatigue, or anger. Symptoms include soreness in your temples, a tightening band-like sensation around your head (a "vice-like" ache). Symptoms can also include a pulling feeling, pressure sensations, and contracting head and neck muscles. The headache begins in your forehead, temples, or the back of your head and neck. Treatment for tension-type headache may include over-the-counter or prescription medications. Treatment may also include self-help techniques such as relaxation training and biofeedback. CLUSTER HEADACHES Q: What is a cluster headache? What causes it? How can I treat it? A: Cluster headache gets its name because the attacks come in groups. The pain arrives with little, if any, warning. It is usually on one side of the head. A tearing or bloodshot eye and a runny nose on the same side of the headache may also accompany the pain. Cluster headaches are believed to be caused by chemical reactions in the brain. They have been described as the most severe and intense of any headache type. Treatment for cluster headache includes prescription medication and oxygen. SINUS HEADACHES Q: What is a sinus headache? What causes it? How can I treat it? A: When a cavity in the bones of the face and skull (a sinus) becomes inflamed, the inflammation will cause localized pain. This condition is usually the result of an allergic reaction, a tumor, or an infection. If your headache is caused by a sinus blockage, such as an infection, you will probably have a fever. An x-ray will confirm a sinus blockage. Your caregiver's treatment might include antibiotics for the infection, as well as antihistamines or decongestants.  REBOUND HEADACHES Q: What is a  rebound headache? What causes it? How can I treat it? A: A pattern of taking acute headache medications too often can lead to a condition known as "rebound headache." A pattern of taking too much  headache medication includes taking it more than 2 days per week or in excessive amounts. That means more than the label or a caregiver advises. With rebound headaches, your medications not only stop relieving pain, they actually begin to cause headaches. Doctors treat rebound headache by tapering the medication that is being overused. Sometimes your caregiver will gradually substitute a different type of treatment or medication. Stopping may be a challenge. Regularly overusing a medication increases the potential for serious side effects. Consult a caregiver if you regularly use headache medications more than 2 days per week or more than the label advises. ADDITIONAL QUESTIONS AND ANSWERS Q: What is biofeedback? A: Biofeedback is a self-help treatment. Biofeedback uses special equipment to monitor your body's involuntary physical responses. Biofeedback monitors:  Breathing.  Pulse.  Heart rate.  Temperature.  Muscle tension.  Brain activity. Biofeedback helps you refine and perfect your relaxation exercises. You learn to control the physical responses that are related to stress. Once the technique has been mastered, you do not need the equipment any more. Q: Are headaches hereditary? A: Four out of five (80%) of people that suffer report a family history of migraine. Scientists are not sure if this is genetic or a family predisposition. Despite the uncertainty, a child has a 50% chance of having migraine if one parent suffers. The child has a 75% chance if both parents suffer.  Q: Can children get headaches? A: By the time they reach high school, most young people have experienced some type of headache. Many safe and effective approaches or medications can prevent a headache from occurring or stop it after it has begun.  Q: What type of doctor should I see to diagnose and treat my headache? A: Start with your primary caregiver. Discuss his or her experience and approach to headaches. Discuss  methods of classification, diagnosis, and treatment. Your caregiver may decide to recommend you to a headache specialist, depending upon your symptoms or other physical conditions. Having diabetes, allergies, etc., may require a more comprehensive and inclusive approach to your headache. The National Headache Foundation will provide, upon request, a list of [facility] physician members in your state. Document Released: 07/25/2003 Document Revised: 07/27/2011 Document Reviewed: 01/02/2008 [facility] Patient Information 2014 [place].

## 2013-07-10 ENCOUNTER — Encounter: Payer: Self-pay | Admitting: Neurology

## 2013-07-10 ENCOUNTER — Other Ambulatory Visit: Payer: Self-pay | Admitting: Neurology

## 2013-07-10 ENCOUNTER — Ambulatory Visit (INDEPENDENT_AMBULATORY_CARE_PROVIDER_SITE_OTHER): Payer: BC Managed Care – PPO | Admitting: Neurology

## 2013-07-10 VITALS — BP 137/95 | HR 86 | Ht 65.0 in | Wt 259.0 lb

## 2013-07-10 DIAGNOSIS — R5383 Other fatigue: Secondary | ICD-10-CM

## 2013-07-10 DIAGNOSIS — R51 Headache: Secondary | ICD-10-CM

## 2013-07-10 DIAGNOSIS — R5381 Other malaise: Secondary | ICD-10-CM

## 2013-07-10 LAB — SEDIMENTATION RATE: Sed Rate: 28 mm/hr — ABNORMAL HIGH (ref 0–22)

## 2013-07-10 NOTE — Progress Notes (Signed)
[facility]    Provider:  Dr Janann Colonel Referring Provider: Rogers Blocker, MD Primary Care Physician:  Kevan Ny, MD  CC:  headache  HPI:  Bianca Medina is a 63 y.o. female here as a referral from Dr. Marlou Sa for headache evaluation  Has had headaches for years but in the past year they have been getting progressively worse. Concerned because a few weeks ago has had episode where she had conjunctival injection for a few days. No itching or tearing up of the eyes. Has a chronic history of a floater in the right eye, can move around and cause some blurry vision. Has been evaluated by eye doctor in the past, they have not noted any abnormalities with her eyes, scheduled to see them tomorrow. Headaches are typically frontal and associated tightness in base of her neck. Described as a dull pressure, no N/V, some photophobia. No focal motor or sensory changes. Headaches typically occur during the work day, does not have them on the weekend. Notes a lot of stress at work. Spends a lot of time on a computer at work.   Notes some difficulty sleeping. Has trouble staying asleep, wakes up feeling tired. Has been told she snores and has apnea events. Can wake up feeling like she is choking.   Also notes history of chronic sinusitis. Otherwise healthy. Trying to get into an exercise routine.   Per outside records: Headaches more frequent for past 1.5 years, notes some conjunctival injection, floaters, blurry vision, trouble with vision and cognition. Occuring around 5 days a week. Instructed to follow up with eye doctor to rule out glaucoma. Head CT ordered but not yet completed.   Review of Systems: Out of a complete 14 system review, the patient complains of only the following symptoms, and all other reviewed systems are negative. Positive weight gain blurred vision memory loss confusion headache and some anxiety constipation snoring allergies  History   Social History  . Marital  Status: Single    Spouse Name: N/A    Number of Children: 3  . Years of Education: college   Occupational History  . [employer]    Social History Main Topics  . Smoking status: Never Smoker   . Smokeless tobacco: Not on file  . Alcohol Use: No  . Drug Use: No  . Sexual Activity: Yes    Birth Control/ Protection: Surgical     Comment: HYSTERECTOMY   Other Topics Concern  . Not on file   Social History Narrative  . No narrative on file    Family History  Problem Relation Age of Onset  . Cancer Mother     LUNG- SMOKER  . Diabetes Father   . Hypertension Father   . Heart disease Father   . Cancer Paternal Aunt     COLON  . Cancer Paternal Uncle     COLON    Past Medical History  Diagnosis Date  . Asthma   . NSVD (normal spontaneous vaginal delivery)     X3  . GERD (gastroesophageal reflux disease)   . PMDD (premenstrual dysphoric disorder)   . Anemia 2003    IRON DEFICIENCY   . Hypertension   . Sleep disorder   . Hyperlipidemia, mild   . Obesity     Past Surgical History  Procedure Laterality Date  . Abdominal hysterectomy    . Tubal ligation      Current Outpatient Prescriptions  Medication Sig Dispense Refill  . Calcium-Vitamin D  600-200 MG-UNIT per tablet Take 1 tablet by mouth daily.      . desloratadine (CLARINEX) 5 MG tablet Take 5 mg by mouth daily.      . Esomeprazole Magnesium (NEXIUM PO) Take by mouth as needed.      . potassium chloride SA (K-DUR,KLOR-CON) 20 MEQ tablet Take 20 mEq by mouth daily.      . Probiotic Product (PROBIOTIC DAILY PO) Take by mouth daily.      . triamterene-hydrochlorothiazide (DYAZIDE) 37.5-25 MG per capsule Take 1 capsule by mouth every morning.       No current facility-administered medications for this visit.    Allergies as of 07/10/2013 - Review Complete 07/10/2013  Allergen Reaction Noted  . Codeine Other (See Comments) 10/07/2011    Vitals: BP 137/95  Pulse 86  Ht 5' 5" (1.651 m)   Wt 259 lb (117.482 kg)  BMI 43.10 kg/m2 Last Weight:  Wt Readings from Last 1 Encounters:  07/10/13 259 lb (117.482 kg)   Last Height:   Ht Readings from Last 1 Encounters:  07/10/13 5' 5" (1.651 m)     Physical exam: Exam: Gen: NAD, conversant Eyes: anicteric sclerae, moist conjunctivae, no conjunctival injection noted bilat HENT: Atraumatic, oropharynx clear, NO temporal tenderness to palpation Neck: Trachea midline; supple,  Lungs: CTA, no wheezing, rales, rhonic                          CV: RRR, no MRG Abdomen: Soft, non-tender;  Extremities: No peripheral edema  Skin: Normal temperature, no rash,  Psych: Appropriate affect, pleasant  Neuro: MS: AA&Ox3, appropriately interactive, normal affect   Speech: fluent w/o paraphasic error  Memory: good recent and remote recall  CN: PERRL, EOMI no nystagmus, VFF to FC bilat, unable to fully visualize fundus due to pupil size,  no ptosis, sensation intact to LT V1-V3 bilat, face symmetric, no weakness, hearing grossly intact, palate elevates symmetrically, shoulder shrug 5/5 bilat,  tongue protrudes midline, no fasiculations noted.  Motor: normal bulk and tone Strength: 5/5  In all extremities  Coord: rapid alternating and point-to-point (FNF, HTS) movements intact.  Reflexes: symmetrical, bilat downgoing toes  Sens: LT intact in all extremities  Gait: posture, stance, stride and arm-swing normal. Tandem gait intact. Able to walk on heels and toes. Romberg absent.   Assessment:  After physical and neurologic examination, review of laboratory studies, imaging, neurophysiology testing and pre-existing records, assessment will be reviewed on the problem list.  Plan:  Treatment plan and additional workup will be reviewed under Problem List.  1)Headache 2)Chronic fatigue  62y/o woman with history of headache presenting for initial evaluation. Her main concern is recent episode of red eyes lasting 2-3 days. History  pertinent for signs of OSA and chronic allergies. Physical exam overall unremarkable. Based on clinical history, headache appears to be tension type in nature, differential would also include headache related to OSA and/or chronic allergies. With history of visual changes, conjunctival injection would need to rule out GCA and glaucoma though both are less likely based on exam and history. Will check ESR, order sleep study. Patient scheduled to follow up with eye doctor. Instructed to use lubricating eye drops throughout the day. Would hold off on head imaging at this time. Follow up once workup completed.     , DO  [facility] [address] [place], [place] [address]  Phone [phone] Fax [phone]  

## 2013-07-10 NOTE — Patient Instructions (Signed)
Overall you are doing fairly well but I do want to suggest a few things today:   As far as diagnostic testing:  1)Please have some blood work completed after you check out, they will direct you to the lab 2)I would like you to be evaluated for sleep apnea. You will be called to schedule an appointment with a sleep doctor 2)Please follow up with your eye doctor  Follow up as needed. Please call us with any interim questions, concerns, problems, updates or refill requests.   Please also call us for any test results so we can go over those with you on the phone.  My clinical assistant and will answer any of your questions and relay your messages to me and also relay most of my messages to you.   Our phone number is [phone]. We also have an after hours call service for urgent matters and there is a physician on-call for urgent questions. For any emergencies you know to call 911 or go to the nearest emergency room

## 2013-07-11 ENCOUNTER — Other Ambulatory Visit: Payer: Self-pay

## 2013-07-11 NOTE — Progress Notes (Signed)
Quick Note:  Called patient and left voicemail message on her mobile phone that her lab work was normal, any questions or concerns, she should call the office back ______

## 2013-10-02 ENCOUNTER — Telehealth: Payer: Self-pay | Admitting: *Deleted

## 2013-10-02 NOTE — Telephone Encounter (Signed)
Pt called c/o white spots near molds on her thighs, I left message for pt to call.

## 2013-10-25 ENCOUNTER — Ambulatory Visit: Payer: BC Managed Care – PPO | Admitting: Gynecology

## 2013-12-12 ENCOUNTER — Ambulatory Visit: Payer: BC Managed Care – PPO | Admitting: Gynecology

## 2013-12-12 ENCOUNTER — Ambulatory Visit (HOSPITAL_BASED_OUTPATIENT_CLINIC_OR_DEPARTMENT_OTHER): Payer: BC Managed Care – PPO | Attending: Internal Medicine

## 2013-12-12 ENCOUNTER — Telehealth: Payer: Self-pay | Admitting: Neurology

## 2013-12-12 VITALS — Ht 65.0 in | Wt 258.0 lb

## 2013-12-12 DIAGNOSIS — G4733 Obstructive sleep apnea (adult) (pediatric): Secondary | ICD-10-CM | POA: Insufficient documentation

## 2013-12-12 NOTE — Telephone Encounter (Signed)
Bianca Medina has ordered patient a sleep study and looks like it is scheduled in September. Do you need a follow up appt made for patient due to the symptoms she is having or wait till after sleep study?

## 2013-12-12 NOTE — Telephone Encounter (Signed)
I will follow up with her after the sleep study. Thanks.

## 2013-12-12 NOTE — Telephone Encounter (Signed)
Patient has been experiencing memory problems, speech problems, head jerking and popping sensation and a "tight" headache.  She is under a lot of stress and took some lorazepam and is wondering if this may be causing her symptoms.  She also wonders if she should be seen by Dr. Janann Colonel before or after her sleep study. Please call to advise.

## 2013-12-13 NOTE — Telephone Encounter (Signed)
Called pt and left message informing her per Dr. Janann Colonel that he will f/u with pt after pt has the sleep study done and if the pt has any other problems, questions or concerns to call the office.

## 2013-12-15 DIAGNOSIS — G4733 Obstructive sleep apnea (adult) (pediatric): Secondary | ICD-10-CM

## 2013-12-15 NOTE — Sleep Study (Signed)
   NAME: Bianca Medina DATE OF BIRTH:  November 05, 1950 MEDICAL RECORD NUMBER [id number]  LOCATION: [facility]  PHYSICIAN: Baird Lyons D  DATE OF STUDY: 12/12/2013  SLEEP STUDY TYPE: Nocturnal Polysomnogram               REFERRING PHYSICIAN: Rogers Blocker, MD  INDICATION FOR STUDY: Insomnia with sleep apnea  EPWORTH SLEEPINESS SCORE:   17/24 HEIGHT: 5\' 5"  (165.1 cm)  WEIGHT: 258 lb (117.028 kg)    Body mass index is 42.93 kg/(m^2).  NECK SIZE: 15 in.  MEDICATIONS: Charted for review  SLEEP ARCHITECTURE: Total sleep time 161.5 minutes with sleep efficiency 39.2%. Stage I was 5.3%, stage II 77.7%, stage III absent, REM 17% of total sleep time. Sleep latency 23.5 minutes, REM latency 43.5 minutes, awake after sleep onset 13 minutes, arousal index 1.1, sleep onset 10:20 PM. She woke spontaneously shortly after 1 AM unable to regain sleep after returning from bathroom.  RESPIRATORY DATA: Apnea hypopneas index (AHI) 11.1 per hour. 30 total events scored, all as hypopneas, non-positional. REM AHI 30.5 per hour. This was a diagnostic polysomnogram without CPAP.  OXYGEN DATA: Mild snoring with oxygen desaturation to a nadir of 80% and mean saturation 90.5% on room air.  CARDIAC DATA: Sinus rhythm with PACs  MOVEMENT/PARASOMNIA: No significant movement disturbance, bathroom x1  IMPRESSION/ RECOMMENDATION:   1) Sleep architecture was significant for inability to regain sleep after returning from bathroom at 1 AM. No bedtime medications. 2) Mild obstructive sleep apnea/hypoxia syndrome, AHI 11.1 per hour with non-positional events. Most events were associated with REM sleep-REM AHI 30.5 per hour. Mild snoring with oxygen desaturation to a nadir of 80% and mean saturation of 90.5% on room air. 3) If conservative measures aren't sufficient, then intervention such as CPAP or an oral appliance may be helpful. Supplementation with a sleep aid medication would likely be required.    Deneise Lever Diplomate, American Board of Sleep Medicine  ELECTRONICALLY SIGNED ON:  12/15/2013, 3:14 PM [place] PH: [phone]   FX: [phone] [place]

## 2014-01-09 ENCOUNTER — Encounter: Payer: Self-pay | Admitting: Gynecology

## 2014-01-09 ENCOUNTER — Other Ambulatory Visit: Payer: Self-pay | Admitting: Gynecology

## 2014-01-09 ENCOUNTER — Ambulatory Visit (INDEPENDENT_AMBULATORY_CARE_PROVIDER_SITE_OTHER): Payer: BC Managed Care – PPO

## 2014-01-09 ENCOUNTER — Ambulatory Visit (INDEPENDENT_AMBULATORY_CARE_PROVIDER_SITE_OTHER): Payer: BC Managed Care – PPO | Admitting: Gynecology

## 2014-01-09 VITALS — BP 124/76 | Ht 64.75 in | Wt 253.0 lb

## 2014-01-09 DIAGNOSIS — N952 Postmenopausal atrophic vaginitis: Secondary | ICD-10-CM

## 2014-01-09 DIAGNOSIS — Z780 Asymptomatic menopausal state: Secondary | ICD-10-CM | POA: Insufficient documentation

## 2014-01-09 DIAGNOSIS — N83339 Acquired atrophy of ovary and fallopian tube, unspecified side: Secondary | ICD-10-CM

## 2014-01-09 DIAGNOSIS — N951 Menopausal and female climacteric states: Secondary | ICD-10-CM

## 2014-01-09 DIAGNOSIS — N942 Vaginismus: Secondary | ICD-10-CM

## 2014-01-09 DIAGNOSIS — Z01419 Encounter for gynecological examination (general) (routine) without abnormal findings: Secondary | ICD-10-CM

## 2014-01-09 MED ORDER — NONFORMULARY OR COMPOUNDED ITEM: Status: DC

## 2014-01-09 NOTE — Progress Notes (Signed)
Bianca Medina Sep 20, 1950 [id number]   History:    63 y.o.  for annual gyn exam whose only complaint was one of feeling tired and fatigued at times. She is scheduled to see her PCP next week Dr. Marlou Sa who will be doing her blood work. He is treating her for hypertension as well as for type 2 diabetes. She currently weighs 253 pounds.  Review of patient's records indicated that in 2004 she had a total abdominal hysterectomy for symptomatic leiomyomatous uteri. Pathology report benign. Patient prior to that had tubal ligation. Patient with past history of colon polyps last colonoscopy was in 2007. She had a colonoscopy in 2012. She had a normal bone density study in 2014. Patient with no prior abnormal Pap smears before her hysterectomy. Patient was never on any HRT in the past.   Past medical history,surgical history, family history and social history were all reviewed and documented in the EPIC chart.  Gynecologic History No LMP recorded. Patient has had a hysterectomy. Contraception: status post hysterectomy Last Pap: 2011. Results were: normal Last mammogram: 2014. Results were: normal  Obstetric History OB History  Gravida Para Term Preterm AB SAB TAB Ectopic Multiple Living  3 3        3     # Outcome Date GA Lbr Len/2nd Weight Sex Delivery Anes PTL Lv  3 PAR           2 PAR           1 PAR                ROS: A ROS was performed and pertinent positives and negatives are included in the history.  GENERAL: No fevers or chills. HEENT: No change in vision, no earache, sore throat or sinus congestion. NECK: No pain or stiffness. CARDIOVASCULAR: No chest pain or pressure. No palpitations. PULMONARY: No shortness of breath, cough or wheeze. GASTROINTESTINAL: No abdominal pain, nausea, vomiting or diarrhea, melena or bright red blood per rectum. GENITOURINARY: No urinary frequency, urgency, hesitancy or dysuria. MUSCULOSKELETAL: No joint or muscle pain, no back pain, no recent trauma.  DERMATOLOGIC: No rash, no itching, no lesions. ENDOCRINE: No polyuria, polydipsia, no heat or cold intolerance. No recent change in weight. HEMATOLOGICAL: No anemia or easy bruising or bleeding. NEUROLOGIC: No headache, seizures, numbness, tingling or weakness. PSYCHIATRIC: No depression, no loss of interest in normal activity or change in sleep pattern.     Exam: chaperone present  BP 124/76  Ht 5' 4.75" (1.645 m)  Wt 253 lb (114.76 kg)  BMI 42.41 kg/m2  Body mass index is 42.41 kg/(m^2).  General appearance : Well developed well nourished female. No acute distress HEENT: Neck supple, trachea midline, no carotid bruits, no thyroidmegaly Lungs: Clear to auscultation, no rhonchi or wheezes, or rib retractions  Heart: Regular rate and rhythm, no murmurs or gallops Breast:Examined in sitting and supine position were symmetrical in appearance, no palpable masses or tenderness,  no skin retraction, no nipple inversion, no nipple discharge, no skin discoloration, no axillary or supraclavicular lymphadenopathy Abdomen: no palpable masses or tenderness, no rebound or guarding Extremities: no edema or skin discoloration or tenderness  Pelvic:  Bartholin, Urethra, Skene Glands: Within normal limits             Vagina: No gross lesions or discharge  Cervix absent               Uterus absence  Adnexa  limited exam due to vaginismus  Anus  and perineum  normal   Rectovaginal  normal sphincter tone without palpated masses or tenderness             Hemoccult  Recent colonoscopy   Ultrasound done today due to patient's vaginismus for better assessment of right adnexa. Ultrasound demonstrated right and left ovary were both atrophic and no adnexal masses.  Assessment/Plan:  63 y.o. female for annual exam will follow up with her PCP in reference to her tiredness and fatigue. He may need to adjust her blood pressure medication or her diabetes medication to see this improve her symptoms. She has been over  10 years since her menopause and has never been on hormone replacement therapy we discussed potential risk and contraindication starting HRT so late. Her symptomatologies do not include hot flashes irritability or mood swings. She is not sexually active. Her PCP will be doing her blood work. She will schedule her mammogram. We discussed importance of calcium vitamin D and regular exercise for osteoporosis prevention.  Note: This dictation was prepared with  Dragon/digital dictation along withSmart phrase technology. Any transcriptional errors that result from this process are unintentional.   Terrance Mass MD, 9:40 AM 01/09/2014

## 2014-01-26 ENCOUNTER — Other Ambulatory Visit: Payer: Self-pay

## 2014-01-26 DIAGNOSIS — Z1231 Encounter for screening mammogram for malignant neoplasm of breast: Secondary | ICD-10-CM

## 2014-02-05 ENCOUNTER — Ambulatory Visit (HOSPITAL_BASED_OUTPATIENT_CLINIC_OR_DEPARTMENT_OTHER): Payer: Self-pay

## 2014-02-10 ENCOUNTER — Emergency Department (HOSPITAL_COMMUNITY)
Admission: EM | Admit: 2014-02-10 | Discharge: 2014-02-10 | Disposition: A | Discharge status: Home / Self Care | Payer: BC Managed Care – PPO | Source: Home / Self Care

## 2014-02-19 ENCOUNTER — Ambulatory Visit: Payer: BC Managed Care – PPO

## 2014-02-27 ENCOUNTER — Encounter (INDEPENDENT_AMBULATORY_CARE_PROVIDER_SITE_OTHER): Payer: Self-pay

## 2014-02-27 ENCOUNTER — Ambulatory Visit
Admission: RE | Admit: 2014-02-27 | Discharge: 2014-02-27 | Disposition: A | Discharge status: Home / Self Care | Payer: BC Managed Care – PPO | Source: Ambulatory Visit

## 2014-02-27 DIAGNOSIS — Z1231 Encounter for screening mammogram for malignant neoplasm of breast: Secondary | ICD-10-CM

## 2014-02-27 IMAGING — MG MM DIGITAL SCREENING BILAT
4 series · 4 of 4 positions shown · non-contrast
Comparison: Previous exam(s)

CLINICAL DATA: Screening.

EXAM:
DIGITAL SCREENING BILATERAL MAMMOGRAM WITH CAD

[R CC]
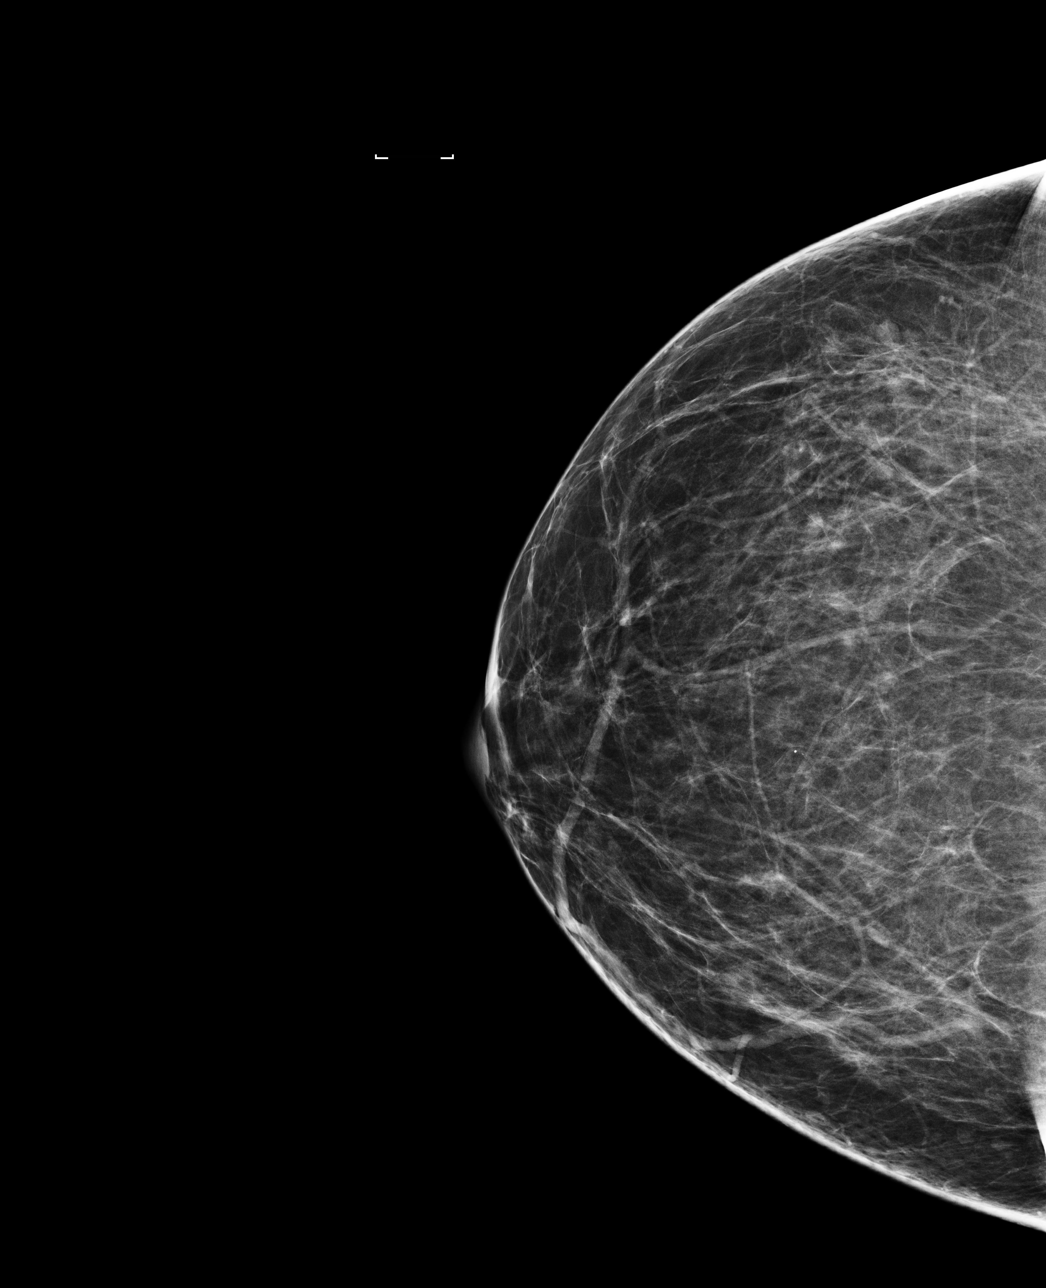

[R MLO]
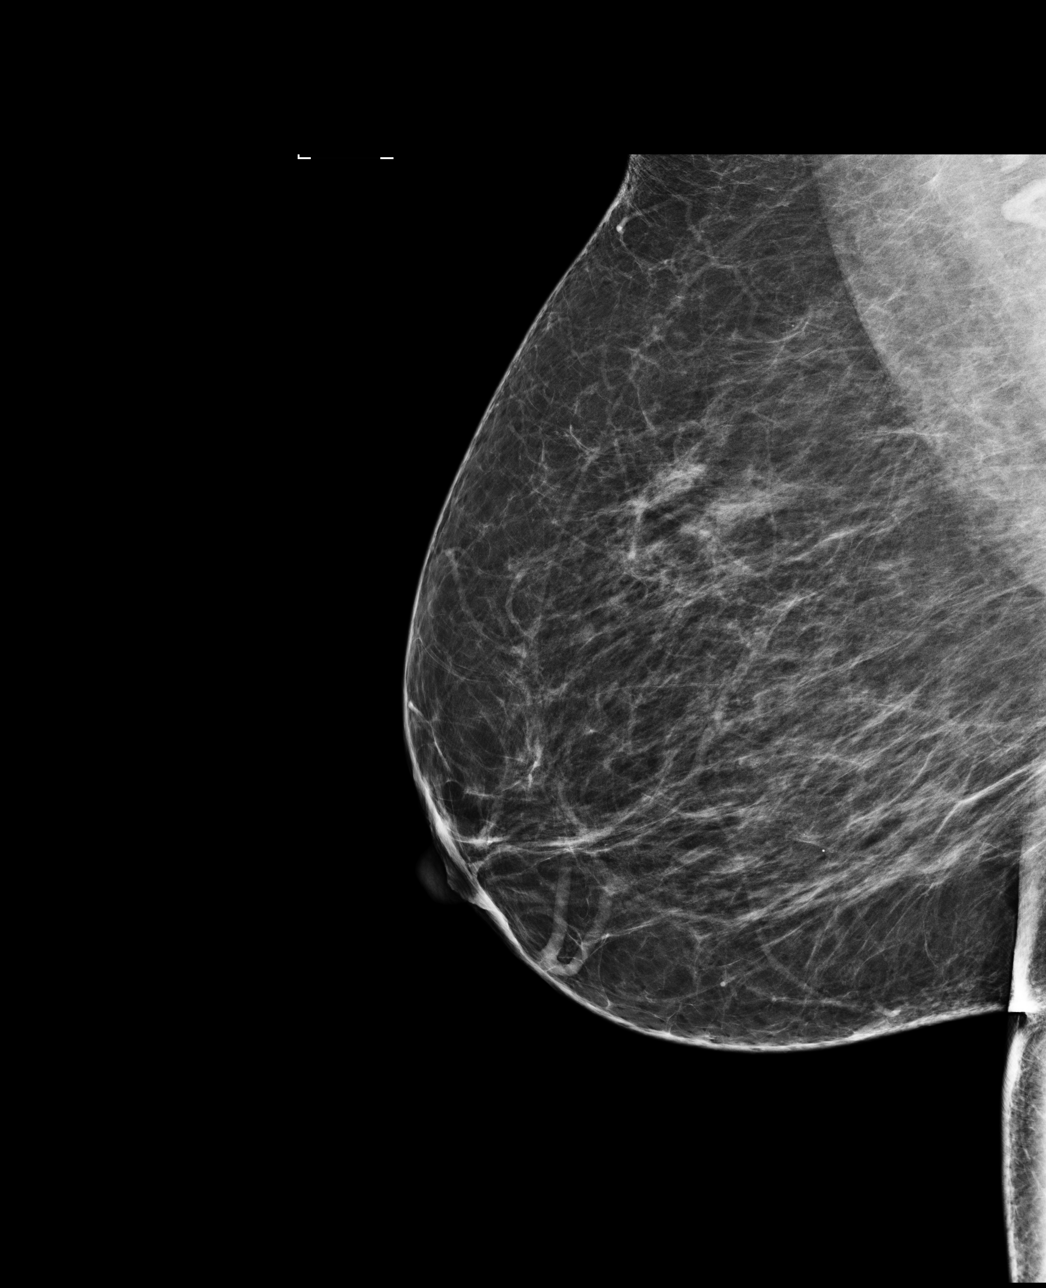

[L MLO]
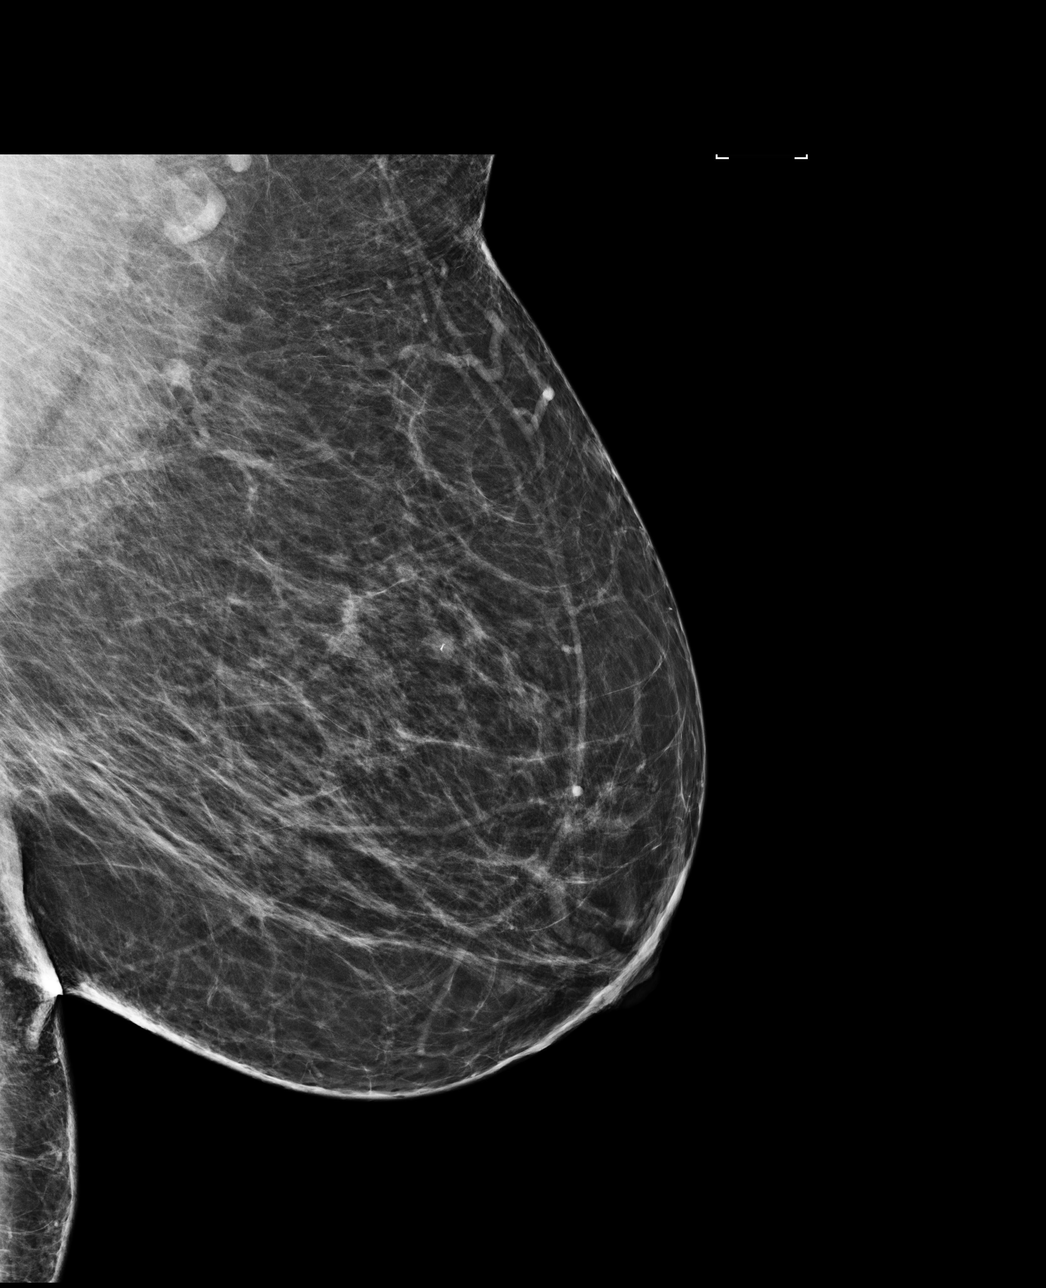

[L CC]
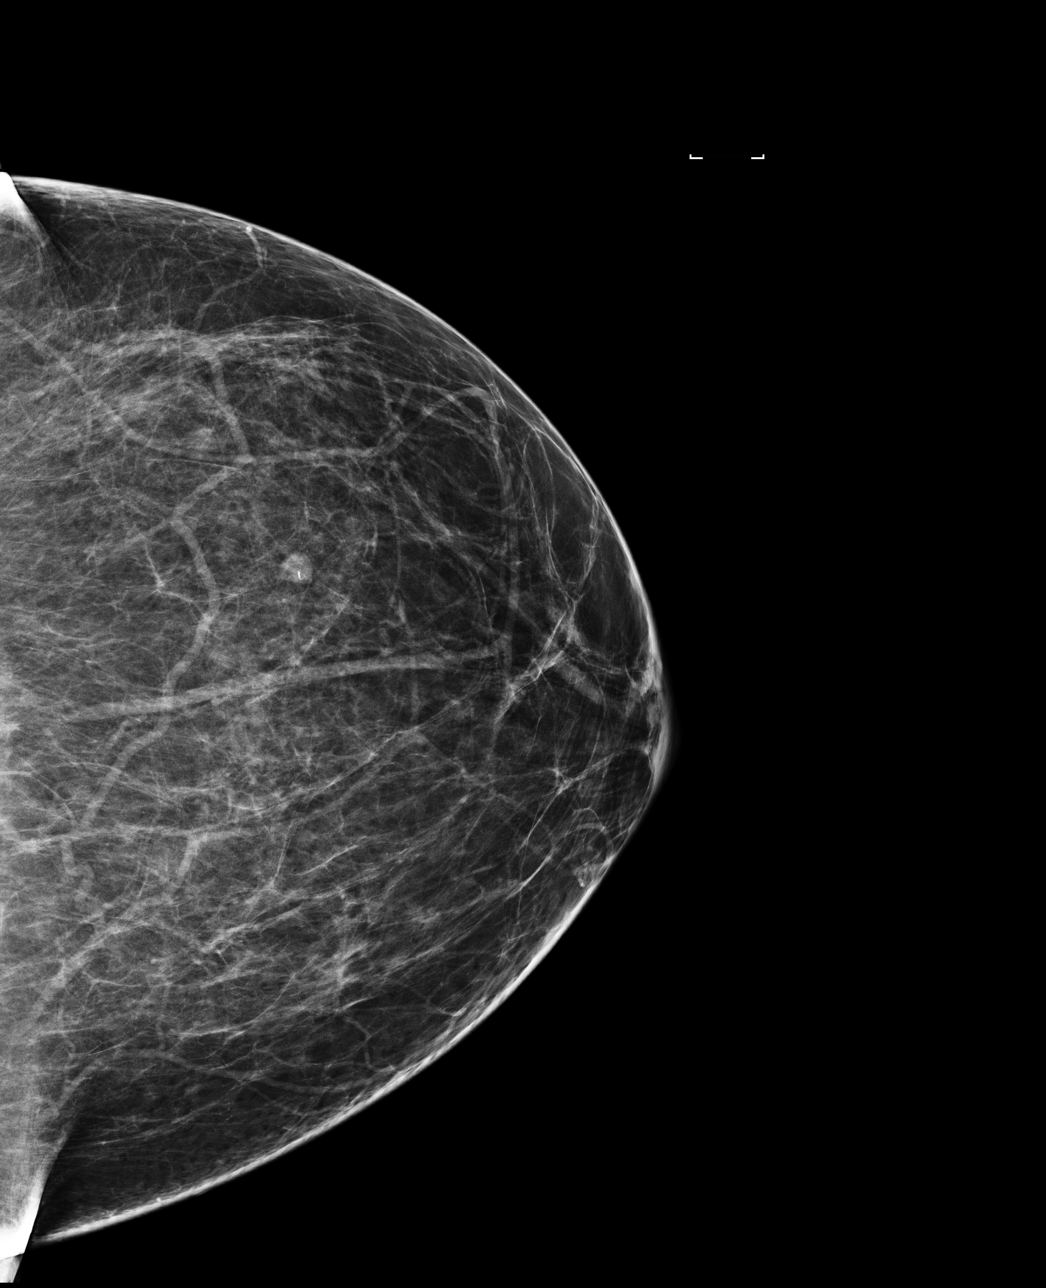

[4 of 4 positions shown; findings below may reference images not displayed]

ACR Breast Density Category b: There are scattered areas of
fibroglandular density.
FINDINGS: In the left breast, a possible mass warrants further evaluation with
spot compression views and possibly ultrasound. In the right breast,
no findings suspicious for malignancy.

Images were processed with CAD.
IMPRESSION: Further evaluation is suggested for possible mass in the left
breast.

RECOMMENDATION:
Diagnostic mammogram and possibly ultrasound of the left breast.
(Code:[TR])

The patient will be contacted regarding the findings, and additional
imaging will be scheduled.

BI-RADS CATEGORY  0: Incomplete. Need additional imaging evaluation
and/or prior mammograms for comparison.

## 2014-03-01 ENCOUNTER — Other Ambulatory Visit: Payer: Self-pay | Admitting: Gynecology

## 2014-03-01 DIAGNOSIS — R928 Other abnormal and inconclusive findings on diagnostic imaging of breast: Secondary | ICD-10-CM

## 2014-03-13 ENCOUNTER — Other Ambulatory Visit: Payer: BC Managed Care – PPO

## 2014-03-19 ENCOUNTER — Encounter: Payer: Self-pay | Admitting: Gynecology

## 2014-03-29 ENCOUNTER — Ambulatory Visit
Admission: RE | Admit: 2014-03-29 | Discharge: 2014-03-29 | Disposition: A | Discharge status: Home / Self Care | Payer: BC Managed Care – PPO | Source: Ambulatory Visit | Attending: Gynecology | Admitting: Gynecology

## 2014-03-29 ENCOUNTER — Other Ambulatory Visit: Payer: BC Managed Care – PPO

## 2014-03-29 DIAGNOSIS — R928 Other abnormal and inconclusive findings on diagnostic imaging of breast: Secondary | ICD-10-CM

## 2014-03-29 IMAGING — MG MM DIAGNOSTIC UNILATERAL L
3 series · 3 of 3 positions shown · non-contrast
Comparison: [DATE]

CLINICAL DATA: Patient is an asymptomatic 62-year-old female with
no family history of breast cancer who was recalled from screening
mammography to further evaluate the left breast.

EXAM:
DIGITAL DIAGNOSTIC  LEFT MAMMOGRAM WITH CAD
ULTRASOUND LEFT BREAST

[L CC (1 of 2)]
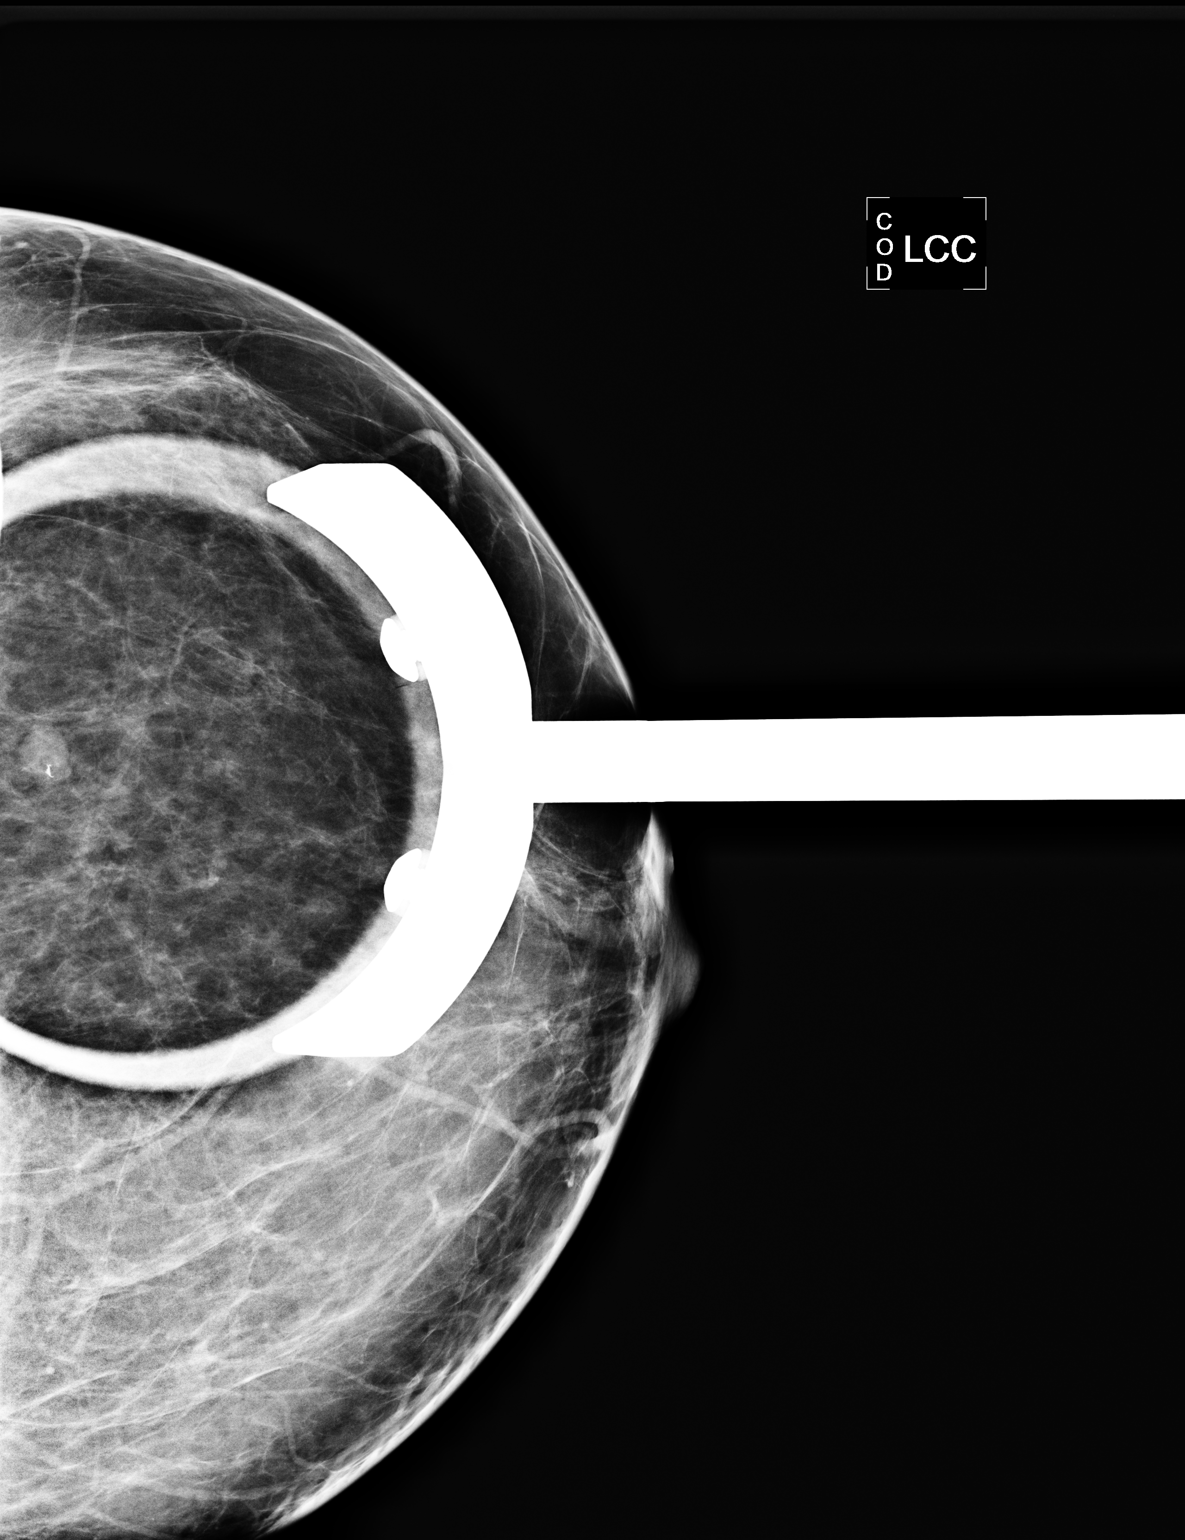

[L MLO]
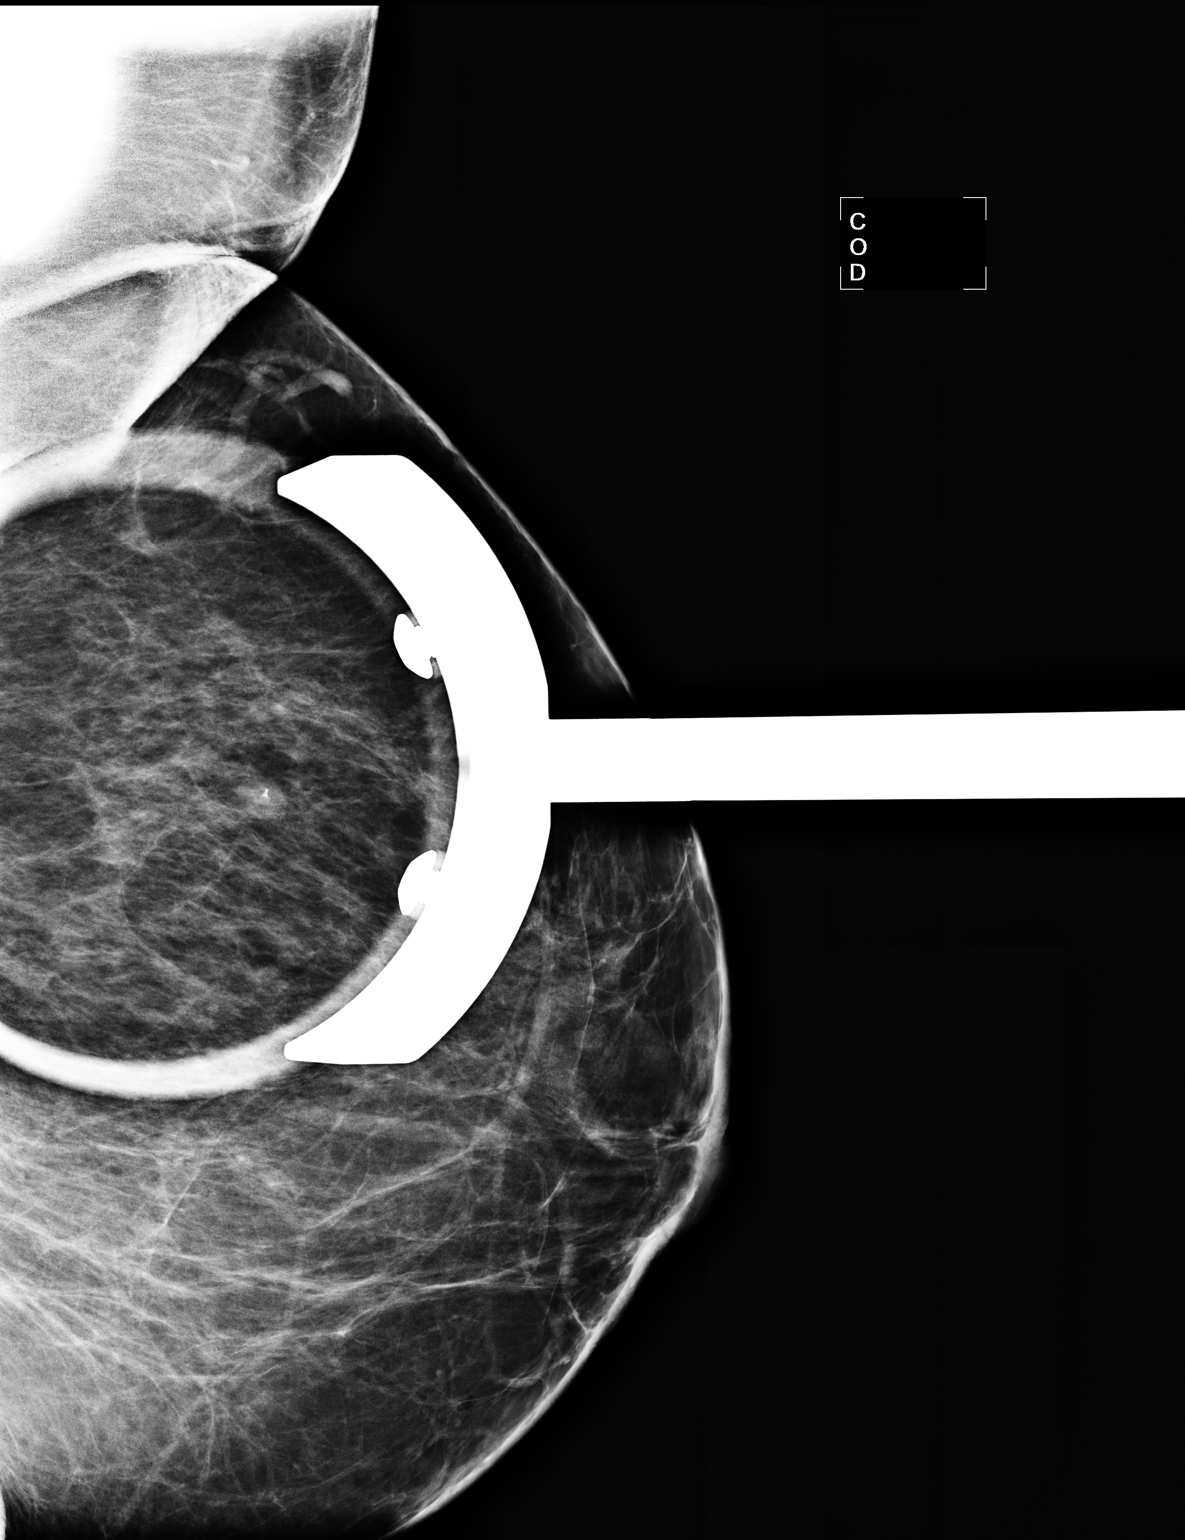

[L CC (2 of 2)]
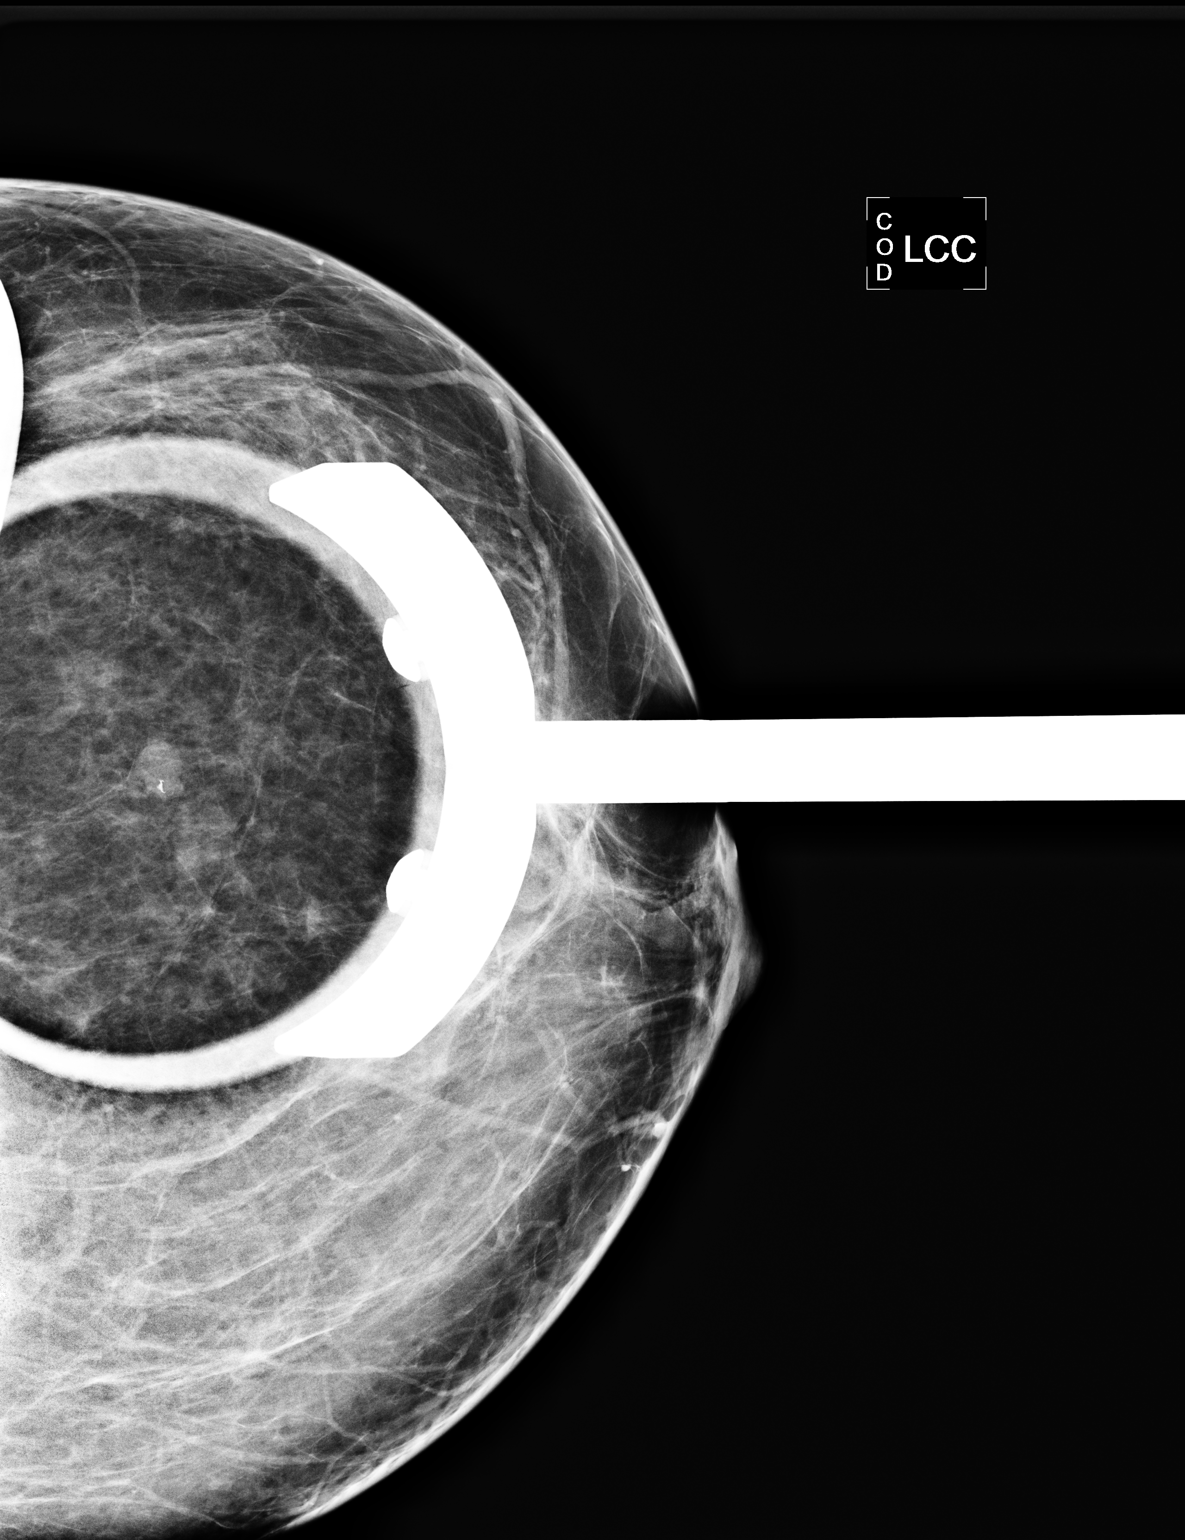

[3 of 3 positions shown; findings below may reference images not displayed]

ACR Breast Density Category b: There are scattered areas of
fibroglandular density.
FINDINGS: Left unilateral digital diagnostic mammography demonstrates
scattered fibroglandular densities. Spot-compression views of the
upper slightly outer left breast reveal a persistent oval
low-density mass with well-circumscribed margins measuring
approximately 8 mm in maximal diameter. This mass is associated with
some peripheral high-density coarse calcification.

Mammographic images were processed with CAD.

On physical exam, no palpable masses are noted.

Targeted ultrasound is performed. This demonstrates a hypoechoic
oval mass with well-circumscribed margins and parallel orientation
at 2 o'clock 5 cm from the nipple measuring approximately 7 x 5 x 6
mm. This represents an excellent sonographic correlate to the
mammographic finding of note and demonstrates probably benign
sonographic features most consistent with a fibroadenoma
IMPRESSION: A 7 mm diameter hypoechoic oval mass in the upper outer left breast
at 2 o'clock 5 cm from the nipple demonstrates probably benign
imaging features most consistent with a benign fibroadenoma.

RECOMMENDATION:
1. Recommend short-term follow-up imaging with left diagnostic
mammography and possible ultrasound in 6 months.
comparison mammograms can be made available, I would be happy to
provide a comparison to the current study and assess the stability
of this finding. Any changes in the previously described
recommendation can be made at that time.

I have discussed the findings and recommendations with the patient.
Results were also provided in writing at the conclusion of the
visit. If applicable, a reminder letter will be sent to the patient
regarding the next appointment.

BI-RADS CATEGORY  3: Probably benign.

## 2014-03-29 IMAGING — US US BREAST*L* LIMITED INC AXILLA
1 series · 5 of 5 positions shown · non-contrast
Comparison: [DATE]

CLINICAL DATA: Patient is an asymptomatic 62-year-old female with
no family history of breast cancer who was recalled from screening
mammography to further evaluate the left breast.

EXAM:
DIGITAL DIAGNOSTIC  LEFT MAMMOGRAM WITH CAD
ULTRASOUND LEFT BREAST

[Series 1: breast · 5 of 5 slices shown]
[im 1/5]
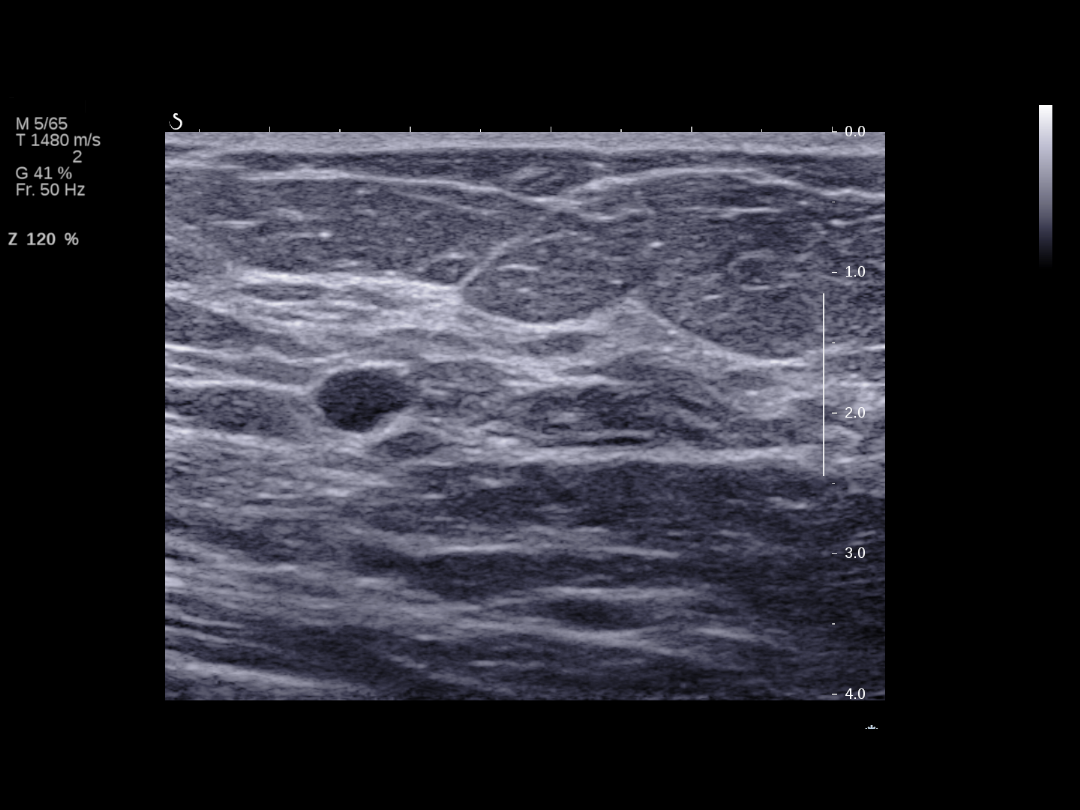
[im 2/5]
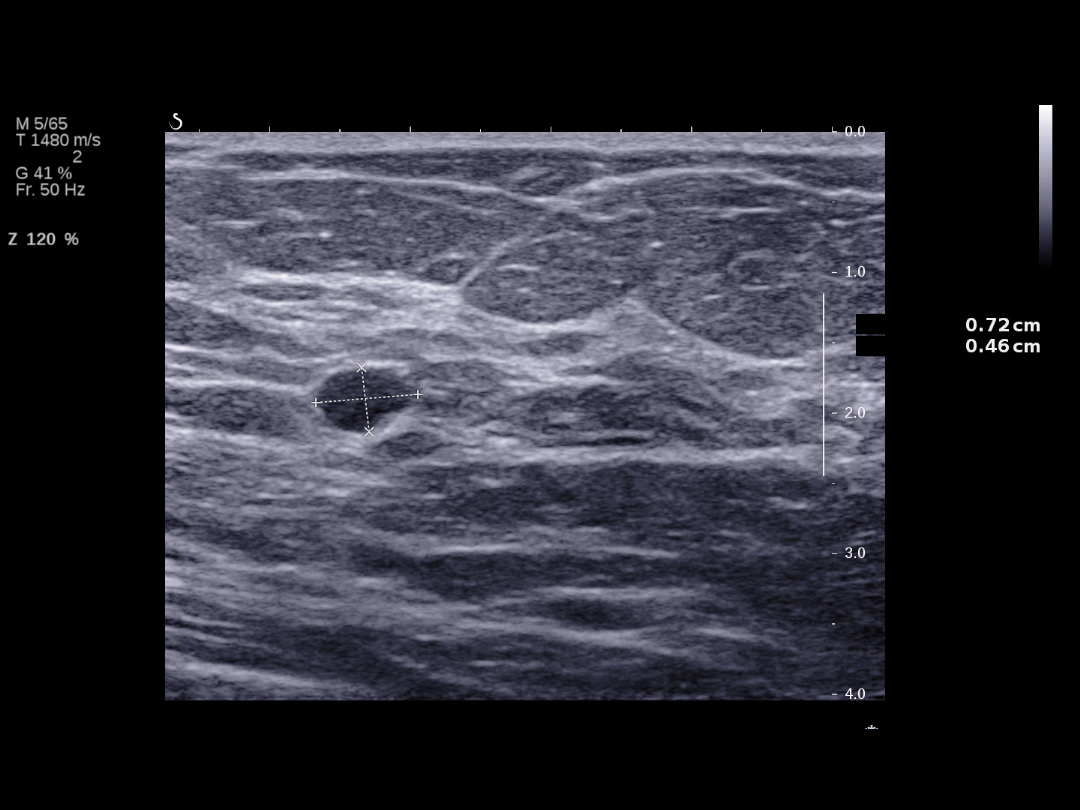
[im 3/5]
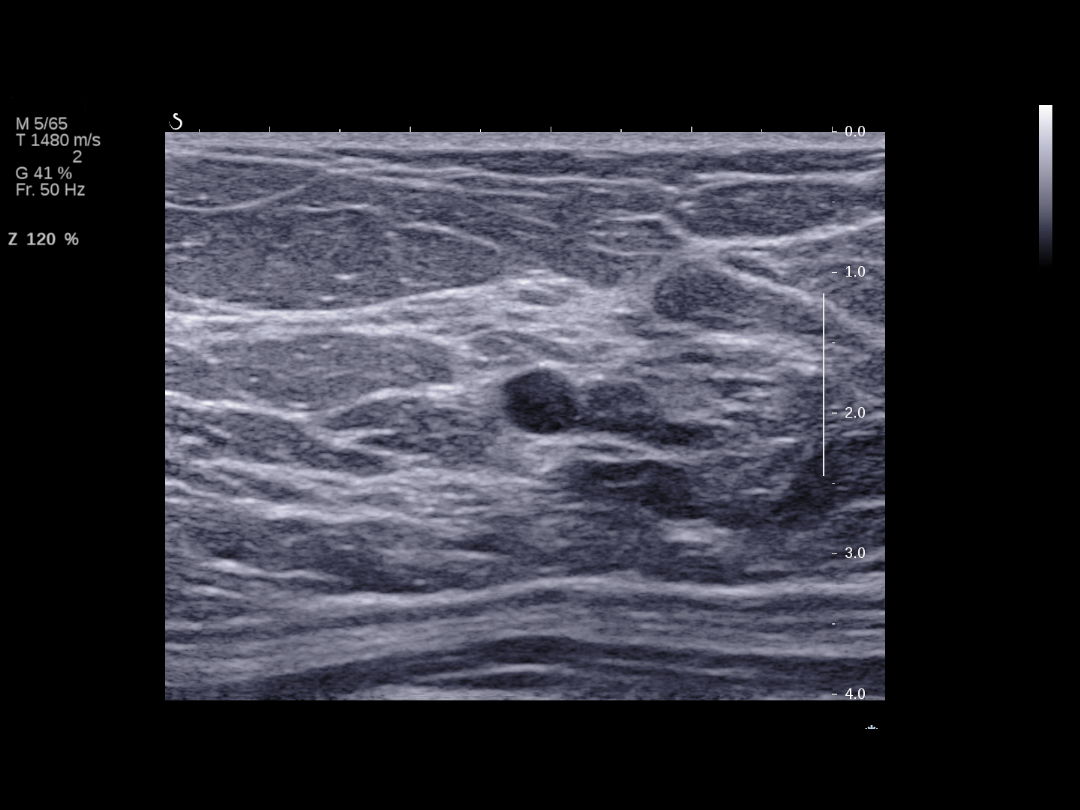
[im 4/5]
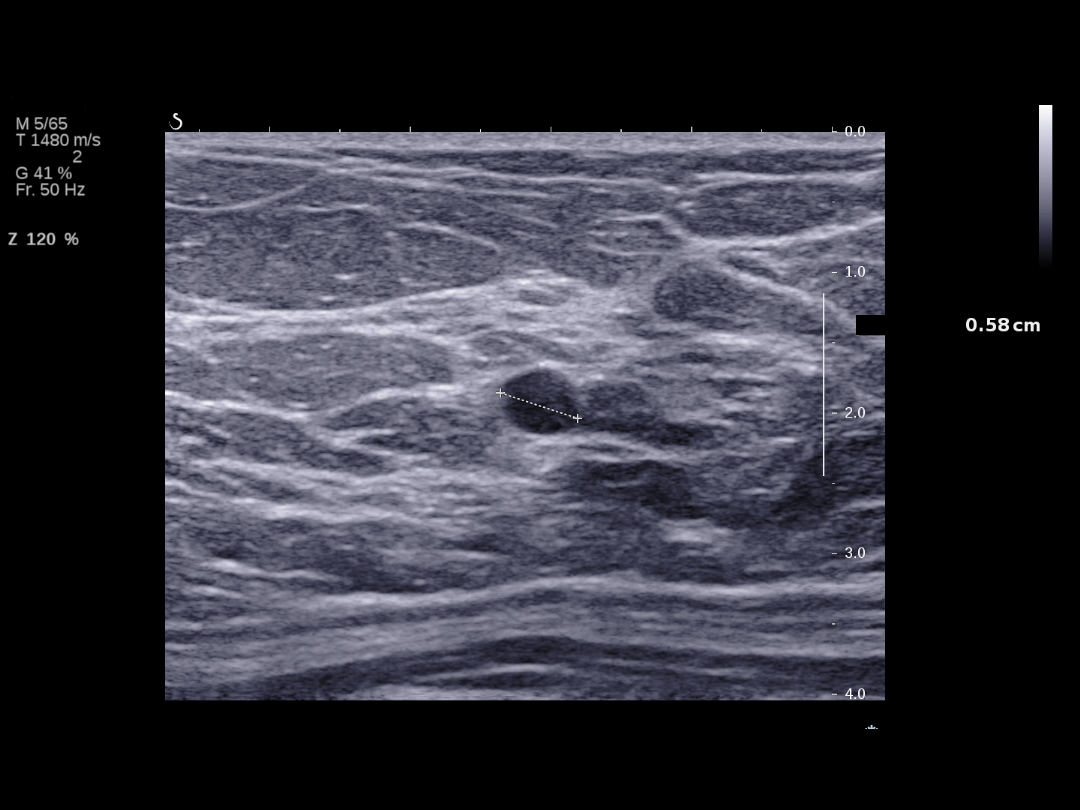
[im 5/5]
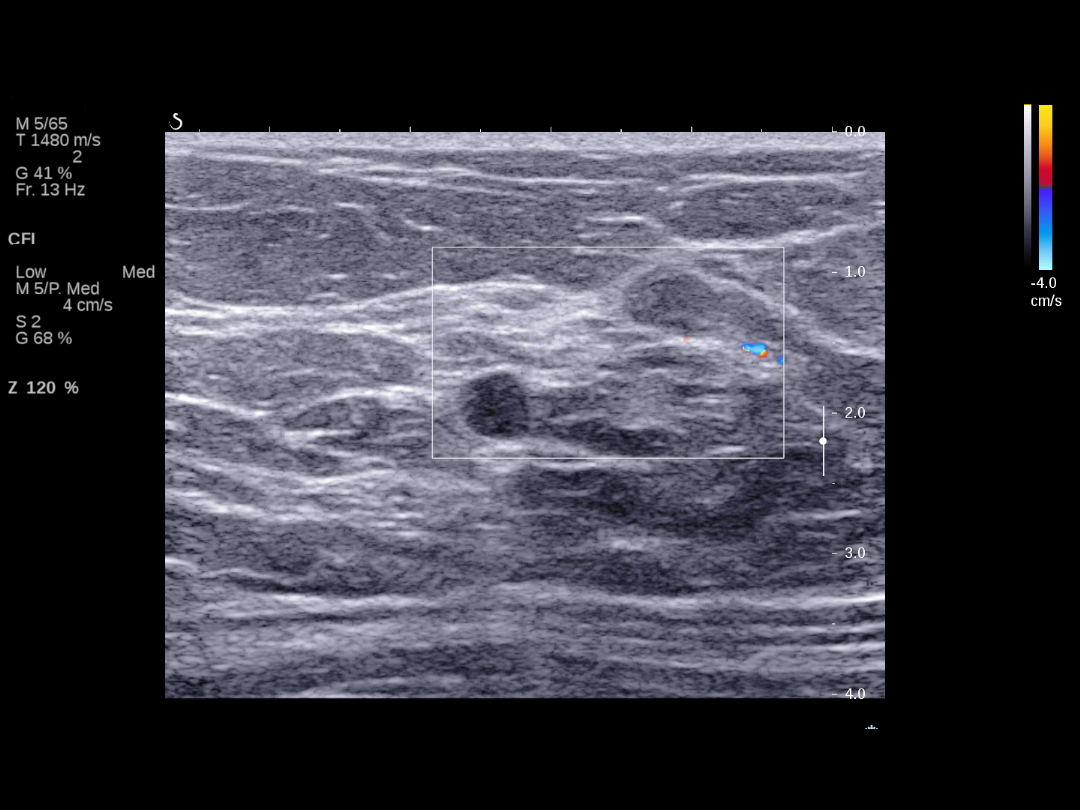

[5 of 5 positions shown; findings below may reference images not displayed]

ACR Breast Density Category b: There are scattered areas of
fibroglandular density.
FINDINGS: Left unilateral digital diagnostic mammography demonstrates
scattered fibroglandular densities. Spot-compression views of the
upper slightly outer left breast reveal a persistent oval
low-density mass with well-circumscribed margins measuring
approximately 8 mm in maximal diameter. This mass is associated with
some peripheral high-density coarse calcification.

Mammographic images were processed with CAD.

On physical exam, no palpable masses are noted.

Targeted ultrasound is performed. This demonstrates a hypoechoic
oval mass with well-circumscribed margins and parallel orientation
at 2 o'clock 5 cm from the nipple measuring approximately 7 x 5 x 6
mm. This represents an excellent sonographic correlate to the
mammographic finding of note and demonstrates probably benign
sonographic features most consistent with a fibroadenoma
IMPRESSION: A 7 mm diameter hypoechoic oval mass in the upper outer left breast
at 2 o'clock 5 cm from the nipple demonstrates probably benign
imaging features most consistent with a benign fibroadenoma.

RECOMMENDATION:
1. Recommend short-term follow-up imaging with left diagnostic
mammography and possible ultrasound in 6 months.
comparison mammograms can be made available, I would be happy to
provide a comparison to the current study and assess the stability
of this finding. Any changes in the previously described
recommendation can be made at that time.

I have discussed the findings and recommendations with the patient.
Results were also provided in writing at the conclusion of the
visit. If applicable, a reminder letter will be sent to the patient
regarding the next appointment.

BI-RADS CATEGORY  3: Probably benign.

## 2014-04-03 ENCOUNTER — Telehealth: Payer: Self-pay | Admitting: *Deleted

## 2014-04-03 NOTE — Telephone Encounter (Signed)
Pt called request to have old mammograms from [place] faxed to [facility]. This was done.

## 2014-04-05 ENCOUNTER — Other Ambulatory Visit: Payer: BC Managed Care – PPO

## 2014-06-25 ENCOUNTER — Other Ambulatory Visit: Payer: Self-pay | Admitting: Gynecology

## 2014-06-25 DIAGNOSIS — N644 Mastodynia: Secondary | ICD-10-CM

## 2014-06-25 DIAGNOSIS — N63 Unspecified lump in unspecified breast: Secondary | ICD-10-CM

## 2014-06-26 ENCOUNTER — Telehealth: Payer: Self-pay | Admitting: *Deleted

## 2014-06-26 NOTE — Telephone Encounter (Signed)
Pt left message in triage requesting order for 3D-mammogram, I call pt back and left in her voicemail no order needed for 3D mammogram screening. If a breast problem OV would be needed.

## 2014-07-19 ENCOUNTER — Encounter: Payer: Self-pay | Admitting: Gynecology

## 2014-07-19 ENCOUNTER — Ambulatory Visit (INDEPENDENT_AMBULATORY_CARE_PROVIDER_SITE_OTHER): Payer: BC Managed Care – PPO | Admitting: Gynecology

## 2014-07-19 VITALS — BP 130/86

## 2014-07-19 DIAGNOSIS — N63 Unspecified lump in breast: Secondary | ICD-10-CM

## 2014-07-19 DIAGNOSIS — N644 Mastodynia: Secondary | ICD-10-CM

## 2014-07-19 DIAGNOSIS — N6320 Unspecified lump in the left breast, unspecified quadrant: Secondary | ICD-10-CM

## 2014-07-19 NOTE — Progress Notes (Signed)
   Patient is a 64 year old who has been complaining of on and off left breast tenderness for the past year but stated that for the past few months it has been occurring at least 3 times a week. She has a sensation that it feels swollen and shooting towards her back she denies any nipple discharge and no recent trauma. Patient denies any family history of breast cancer.  Review of patient's record indicated in October 2015 she had a screening mammogram with the following interpretation:  FINDINGS: In the left breast, a possible mass warrants further evaluation with spot compression views and possibly ultrasound. In the right breast, no findings suspicious for malignancy.  Images were processed with CAD.  IMPRESSION: Further evaluation is suggested for possible mass in the left breast.  RECOMMENDATION: Diagnostic mammogram and possibly ultrasound of the left breast. (Code:FI-L-53M)  The patient will be contacted regarding the findings, and additional imaging will be scheduled.  Patient then return on November 12 were she had an ultrasound for further evaluation with the following interpretation reported:  FINDINGS: Left unilateral digital diagnostic mammography demonstrates scattered fibroglandular densities. Spot-compression views of the upper slightly outer left breast reveal a persistent oval low-density mass with well-circumscribed margins measuring approximately 8 mm in maximal diameter. This mass is associated with some peripheral high-density coarse calcification.  Mammographic images were processed with CAD.  On physical exam, no palpable masses are noted.  Targeted ultrasound is performed. This demonstrates a hypoechoic oval mass with well-circumscribed margins and parallel orientation at 2 o'clock 5 cm from the nipple measuring approximately 7 x 5 x 6 mm. This represents an excellent sonographic correlate to the mammographic finding of note and demonstrates  probably benign sonographic features most consistent with a fibroadenoma  IMPRESSION: A 7 mm diameter hypoechoic oval mass in the upper outer left breast at 2 o'clock 5 cm from the nipple demonstrates probably benign imaging features most consistent with a benign fibroadenoma.  RECOMMENDATION: 1. Recommend short-term follow-up imaging with left diagnostic mammography and possible ultrasound in 6 months. 2. Patient reports multiple mammograms performed after 2006. If her comparison mammograms can be made available, I would be happy to provide a comparison to the current study and assess the stability of this finding. Any changes in the previously described recommendation can be made at that tim  Breast exam: Patient was examined sitting supine position. Left breast slightly larger than the right breast which patient describes has been this way all her life anatomically. There was no skin discoloration on either breast there was no palpable masses on either breast no supraclavicular axillary lymphadenopathy on either breast. The area described above on ultrasound 5 finger breast from the areolar region at the 2:00 position was tender on examination.  Assessment/plan: Patient with left breast tenderness mammogram and ultrasound in 2015 areas suspicious for fibroadenoma. Patient with persistent pain in this area will be referred to the radiologist for diagnostic mammogram and ultrasound for reassessment.

## 2014-07-20 ENCOUNTER — Telehealth: Payer: Self-pay | Admitting: *Deleted

## 2014-07-20 DIAGNOSIS — N644 Mastodynia: Secondary | ICD-10-CM

## 2014-07-20 NOTE — Telephone Encounter (Signed)
-----   Message from Terrance Mass, MD sent at 07/19/2014  4:26 PM EST ----- Please schedule diagnostic mammogram. Left breast tenderness 5 fingerbreadths from areolar region at the 2 o'clock position which correalates with mammogram arrea of concern noted in November of [id number]

## 2014-07-20 NOTE — Telephone Encounter (Signed)
Orders placed for the below at breast center they will contact pt to schedule.

## 2014-07-23 NOTE — Telephone Encounter (Signed)
appointment 08/17/14 @ 4:00pm

## 2014-08-17 ENCOUNTER — Ambulatory Visit
Admission: RE | Admit: 2014-08-17 | Discharge: 2014-08-17 | Disposition: A | Discharge status: Home / Self Care | Payer: BC Managed Care – PPO | Source: Ambulatory Visit | Attending: Gynecology | Admitting: Gynecology

## 2014-08-17 DIAGNOSIS — N644 Mastodynia: Secondary | ICD-10-CM

## 2014-08-17 IMAGING — MG MM DIAG BREAST TOMO UNI LEFT
4 series · 6 of 12 positions shown · non-contrast
Comparison: Mammography [DATE] and earlier.

CLINICAL DATA: Intermittent pain in the upper and upper outer
quadrant of the left breast and paresthesias involving the left
nipple which the patient initially noted approximately 1 month ago.
A likely benign fibroadenoma in the left breast is currently being
followed, and it is within 2 weeks of the time for the initial
six-month follow-up (the current examination will act as the 6 month
followup).

EXAM:
DIGITAL DIAGNOSTIC LEFT MAMMOGRAM WITH 3D TOMOSYNTHESIS WITH CAD
ULTRASOUND LEFT BREAST

[L MLO]
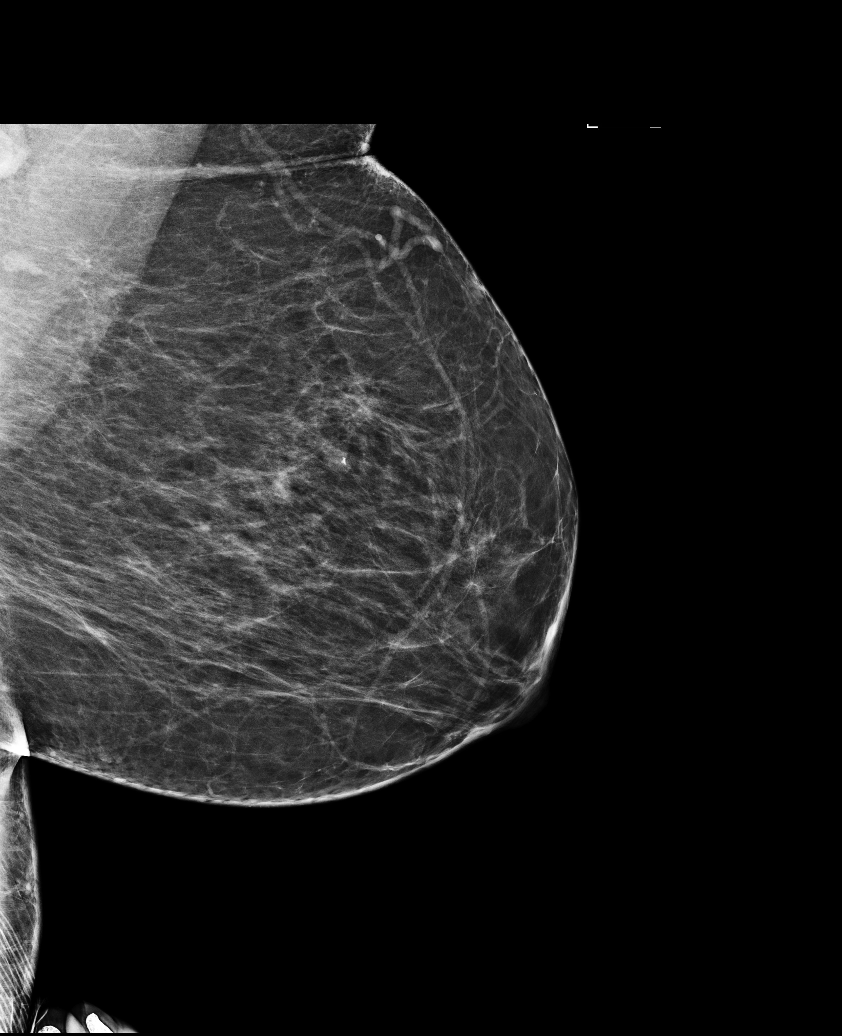

[L CC]
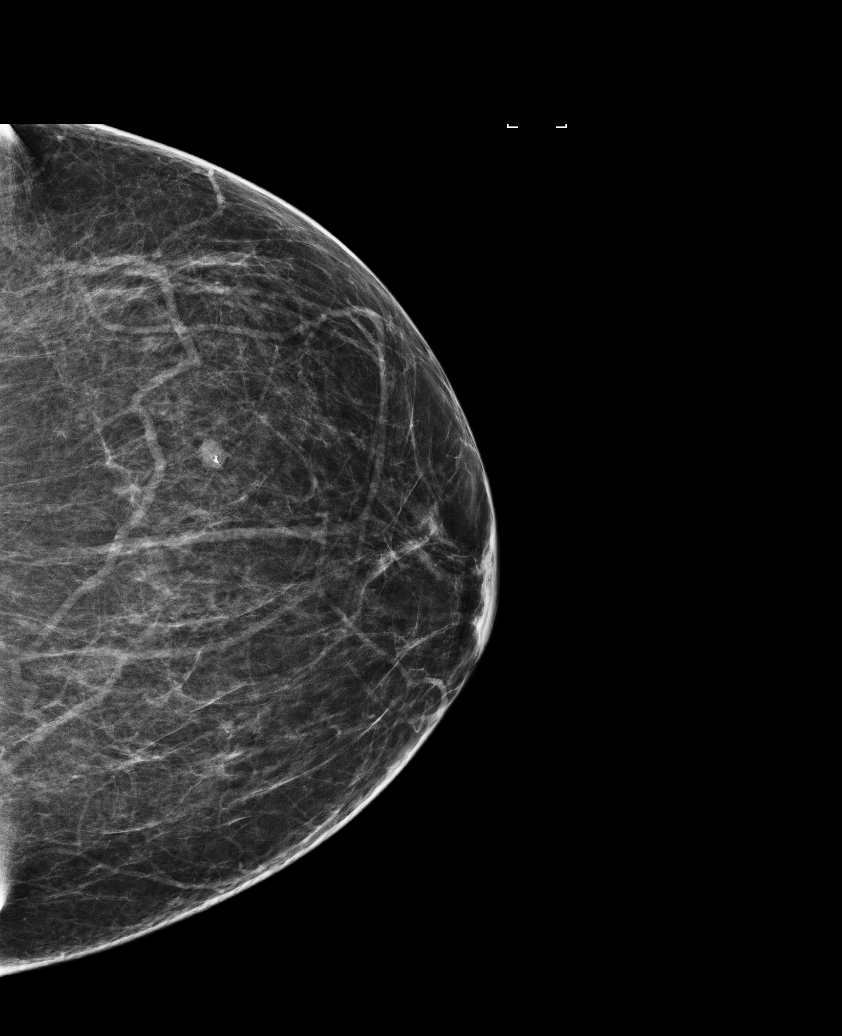

[L MLO tomo · 3 of 88 frames shown]
[frame 29/88]
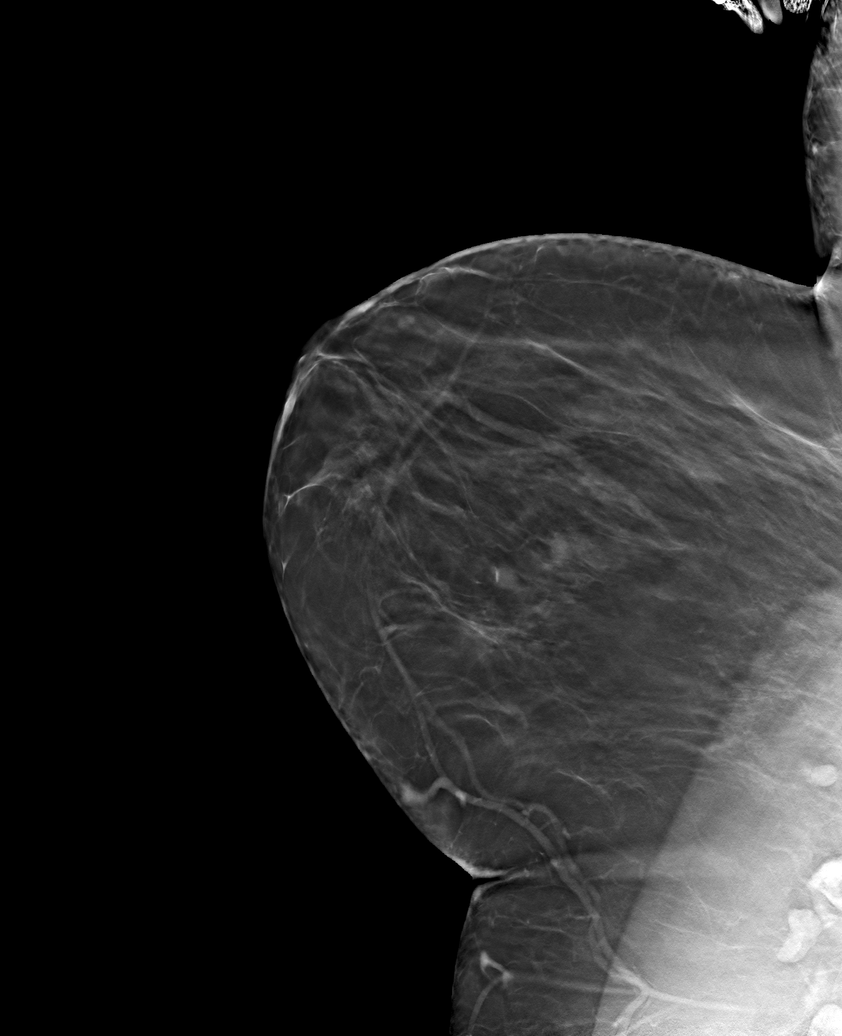
[frame 45/88]
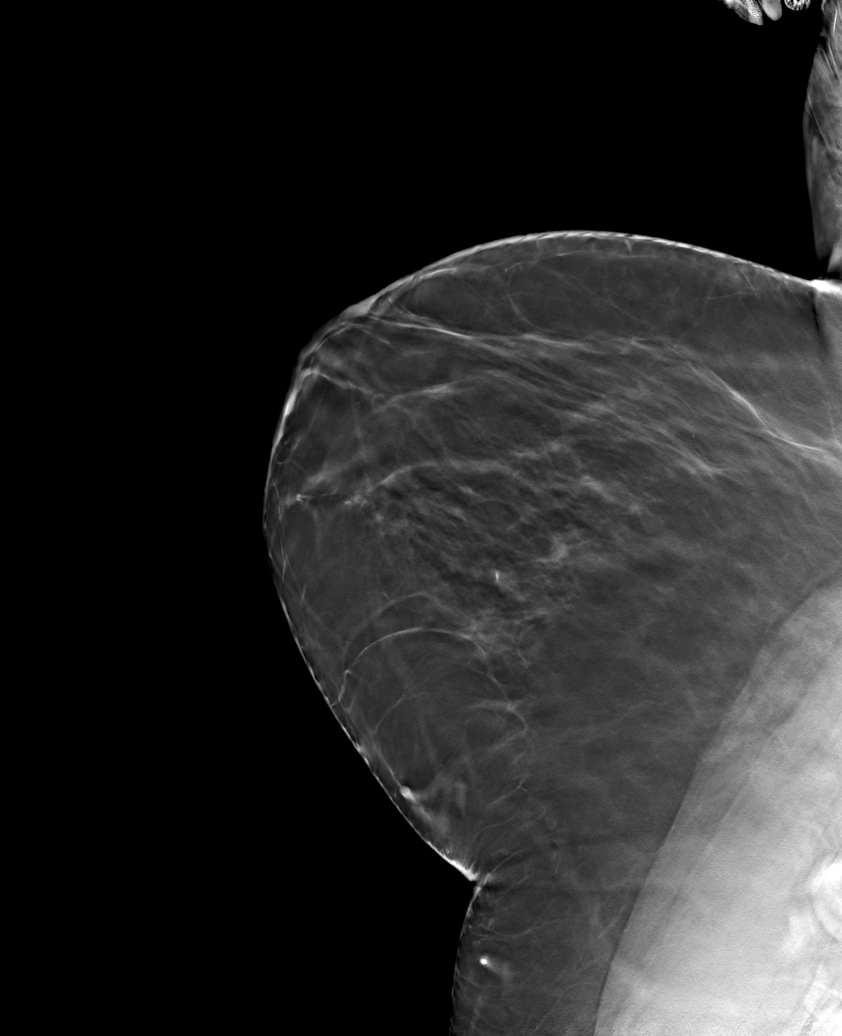
[frame 60/88]
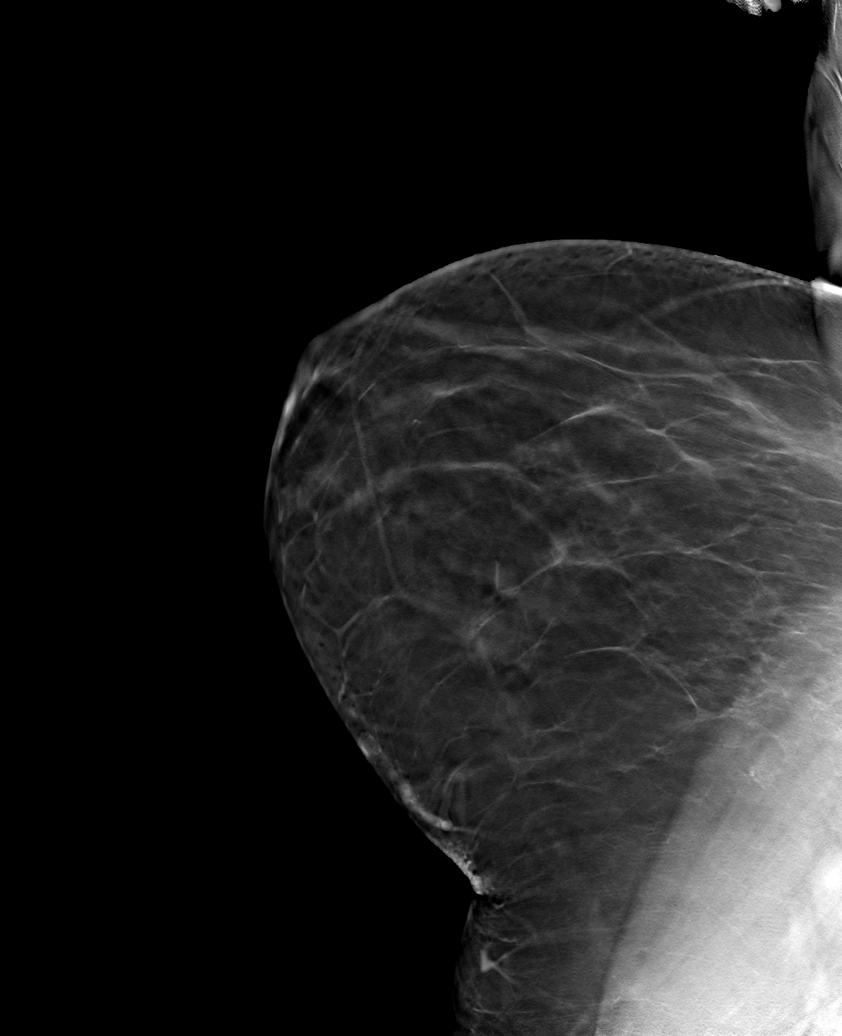

[L CC tomo · tomo slice 35/70.0]
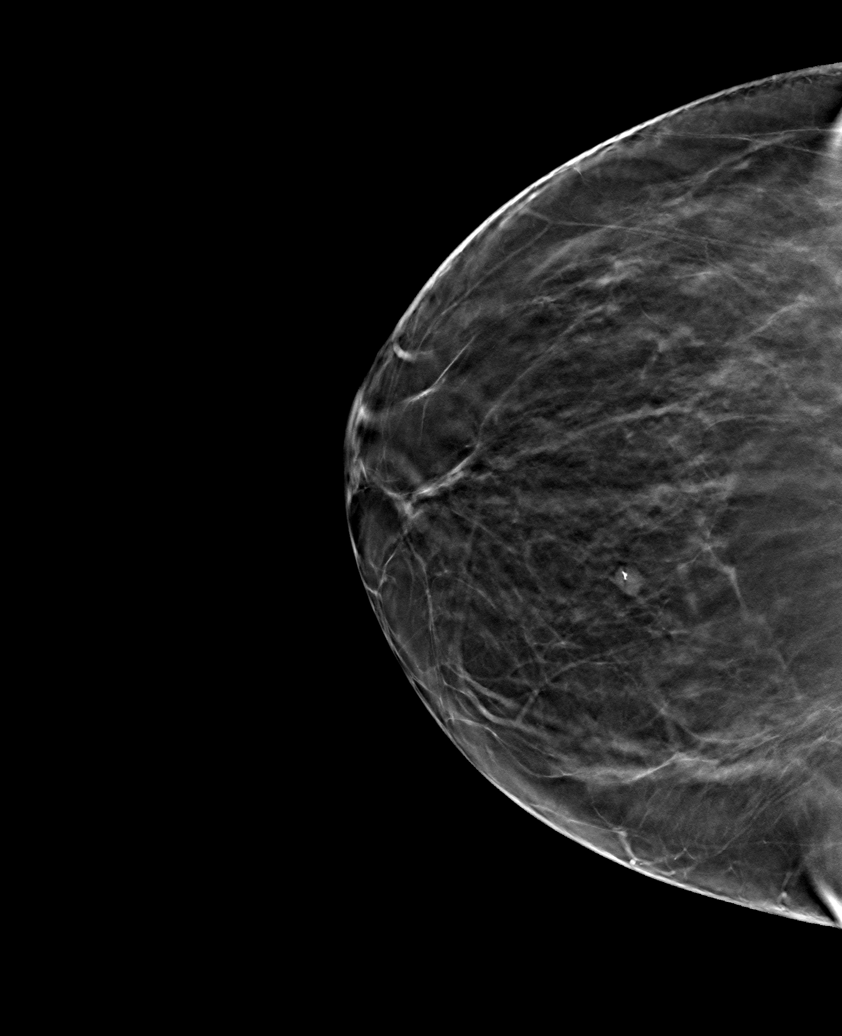

[6 of 12 positions shown; findings below may reference images not displayed]

Left breast
ultrasound [DATE].

ACR Breast Density Category b: There are scattered areas of
fibroglandular density.
FINDINGS: CC and MLO views of the left breast with tomosynthesis were
obtained. The circumscribed low-density mass with associated coarse
dystrophic calcification in the upper outer quadrant of the left
breast is unchanged. No new or suspicious findings elsewhere in the
left breast.

Mammographic images were processed with CAD.

On physical exam, there is no palpable abnormality in the upper left
breast or the upper outer quadrant of the left breast.

Targeted left breast ultrasound is performed, showing normal
fibrofatty and scattered fibroglandular tissue throughout the upper
left breast and upper outer quadrant of the left breast in the area
of pain. These circumscribed horizontally oriented hypoechoic mass
with slight acoustic enhancement at the 2 o'clock position
approximately 5 cm from the nipple currently measures blank,
previously 6 x 5 x 7 mm. No new suspicious solid mass or abnormal
acoustic shadowing is identified.
IMPRESSION: 1. Stable likely benign degenerating fibroadenoma in the upper outer
quadrant of the left breast.
2. No new or suspicious findings elsewhere in the left breast.
Specifically, no mammographic or sonographic abnormality in the area
of focal pain in the upper and upper outer left breast.

RECOMMENDATION:
Left breast ultrasound at the time of annual bilateral diagnostic
mammography which is due in [DATE].

I have discussed the findings and recommendations with the patient.
Results were also provided in writing at the conclusion of the
visit. If applicable, a reminder letter will be sent to the patient
regarding the next appointment.

BI-RADS CATEGORY  3: Probably benign.

## 2014-12-18 ENCOUNTER — Ambulatory Visit
Admission: RE | Admit: 2014-12-18 | Discharge: 2014-12-18 | Disposition: A | Discharge status: Home / Self Care | Payer: BLUE CROSS/BLUE SHIELD | Source: Ambulatory Visit | Attending: Internal Medicine | Admitting: Internal Medicine

## 2014-12-18 ENCOUNTER — Other Ambulatory Visit: Payer: Self-pay | Admitting: Internal Medicine

## 2014-12-18 DIAGNOSIS — M545 Low back pain: Secondary | ICD-10-CM

## 2014-12-18 DIAGNOSIS — R531 Weakness: Secondary | ICD-10-CM

## 2014-12-18 IMAGING — CR DG LUMBAR SPINE COMPLETE 4+V
5 series · 5 of 5 positions shown · non-contrast
Comparison: None.

CLINICAL DATA: Right lower lumbar pain

EXAM:
LUMBAR SPINE - COMPLETE 4+ VIEW

[t l-spine a.p.]
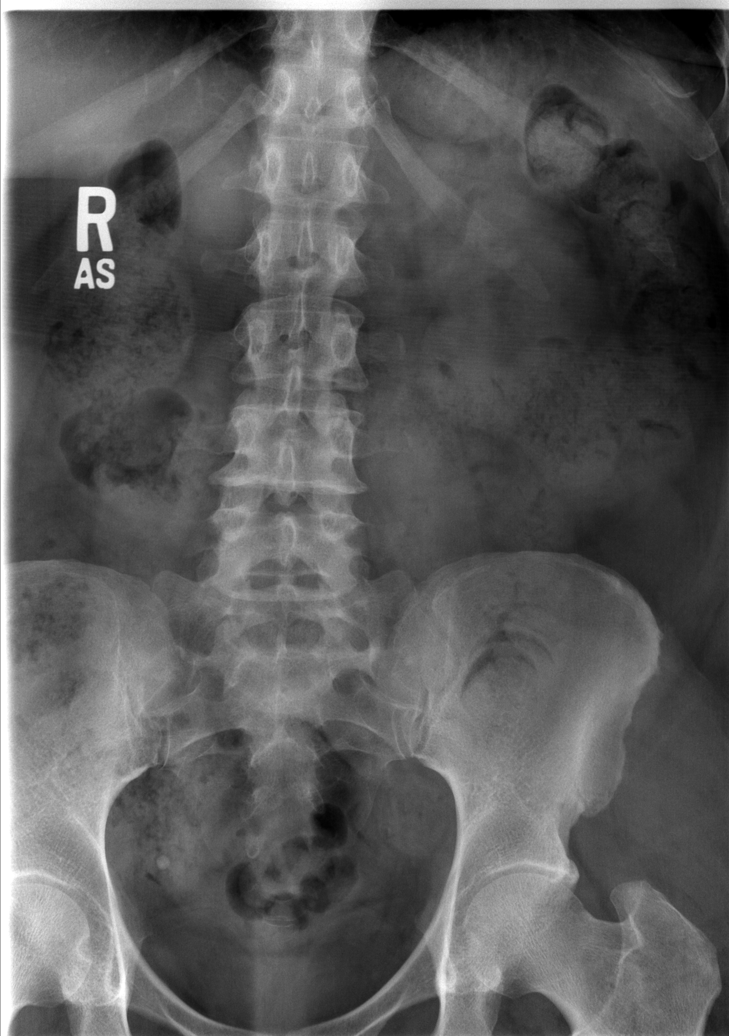

[t l-spine oblique exposure (1 of 2)]
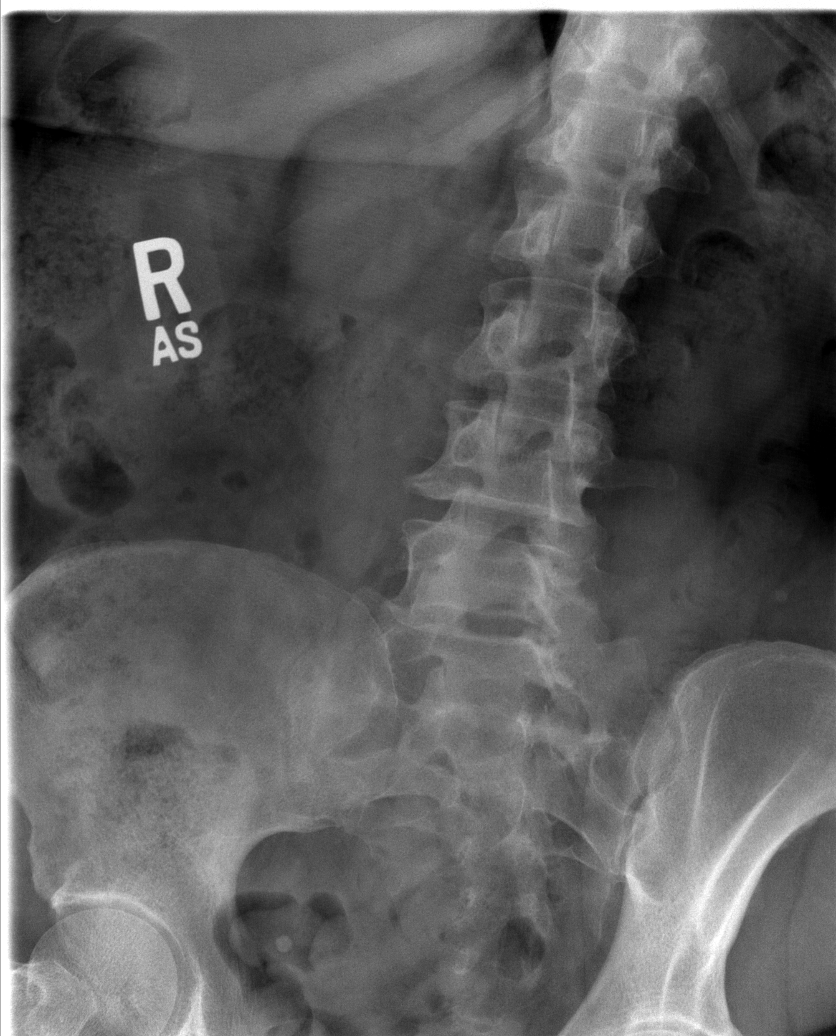

[t l-spine oblique exposure (2 of 2)]
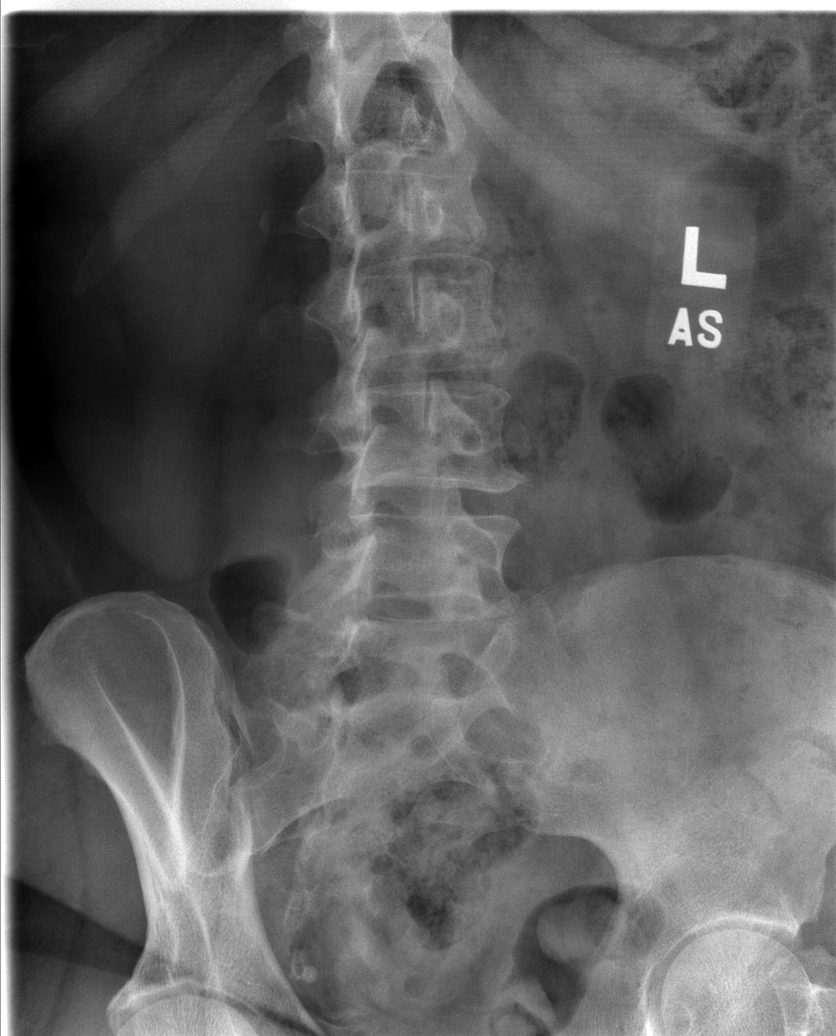

[t l-spine lat]
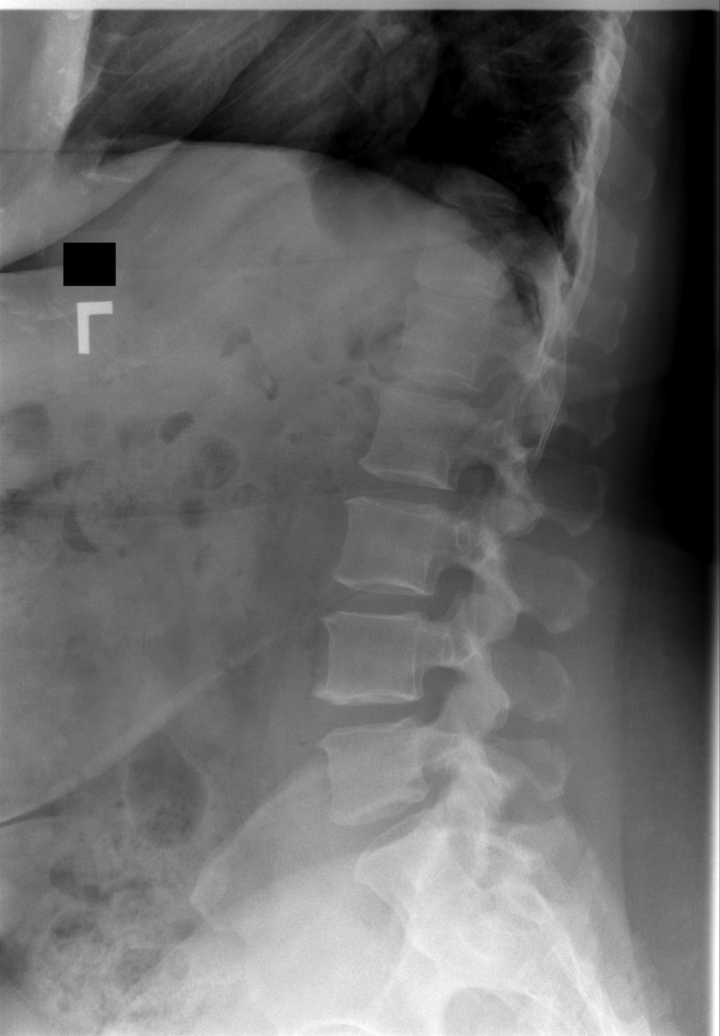

[t l-spine l5-s1 spot]
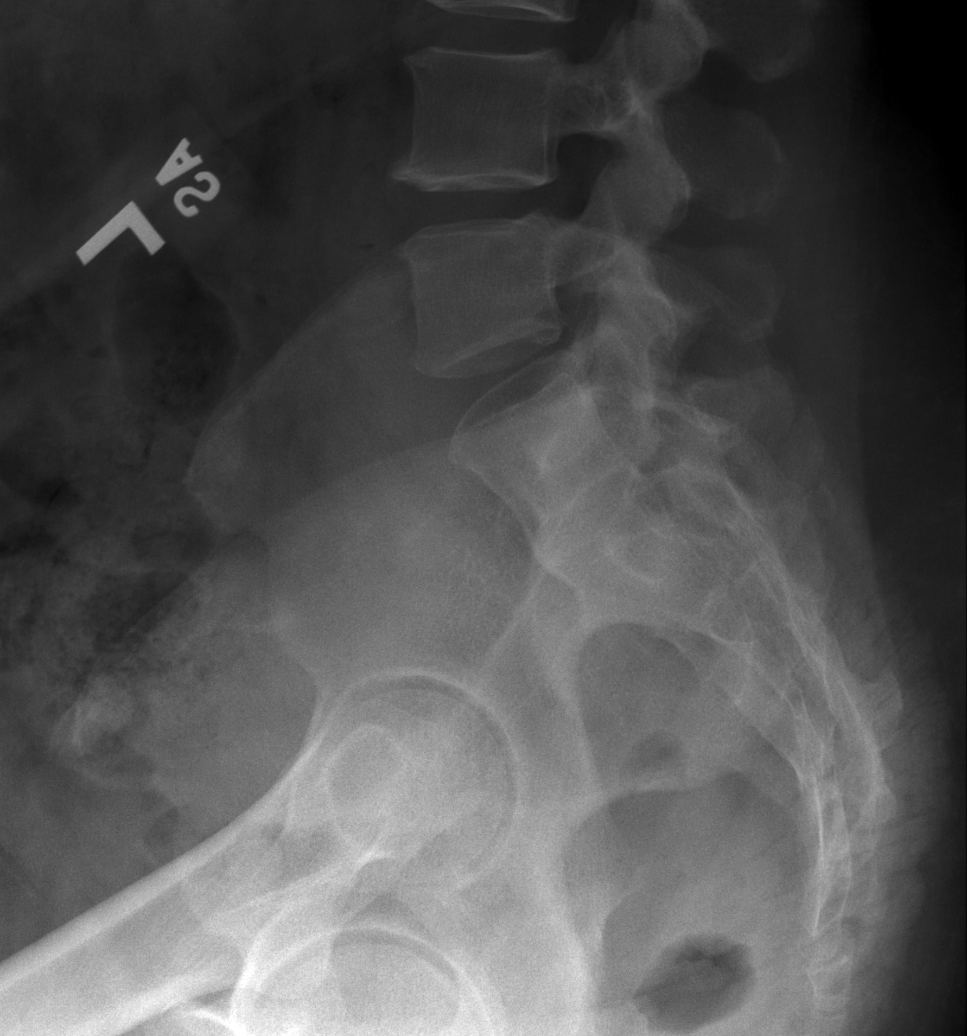

[5 of 5 positions shown; findings below may reference images not displayed]

FINDINGS: There is no evidence of lumbar spine fracture. Alignment is normal.
Intervertebral disc spaces are maintained.
IMPRESSION: Negative.

## 2015-01-22 ENCOUNTER — Other Ambulatory Visit: Payer: Self-pay | Admitting: Gynecology

## 2015-01-22 DIAGNOSIS — D242 Benign neoplasm of left breast: Secondary | ICD-10-CM

## 2015-03-15 ENCOUNTER — Ambulatory Visit
Admission: RE | Admit: 2015-03-15 | Discharge: 2015-03-15 | Disposition: A | Discharge status: Home / Self Care | Payer: BC Managed Care – PPO | Source: Ambulatory Visit | Attending: Gynecology | Admitting: Gynecology

## 2015-03-15 ENCOUNTER — Other Ambulatory Visit: Payer: BLUE CROSS/BLUE SHIELD

## 2015-03-15 DIAGNOSIS — D242 Benign neoplasm of left breast: Secondary | ICD-10-CM

## 2015-03-15 IMAGING — MG MM DIAG BREAST TOMO BILATERAL
8 series · 8 of 24 positions shown · non-contrast
Comparison: Previous exams including diagnostic mammogram and
ultrasound dated [DATE].

CLINICAL DATA: Six-month follow-up for probably benign degenerating
fibroadenoma within the left breast.

EXAM:
DIGITAL DIAGNOSTIC BILATERAL MAMMOGRAM WITH 3D TOMOSYNTHESIS WITH
CAD
ULTRASOUND LEFT BREAST

[R MLO]
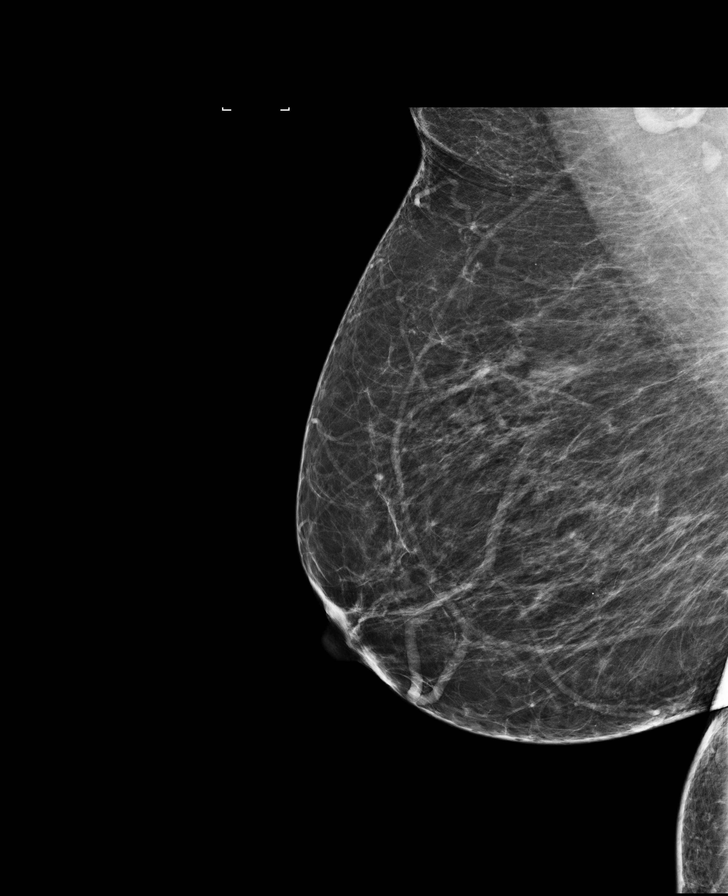

[L MLO]
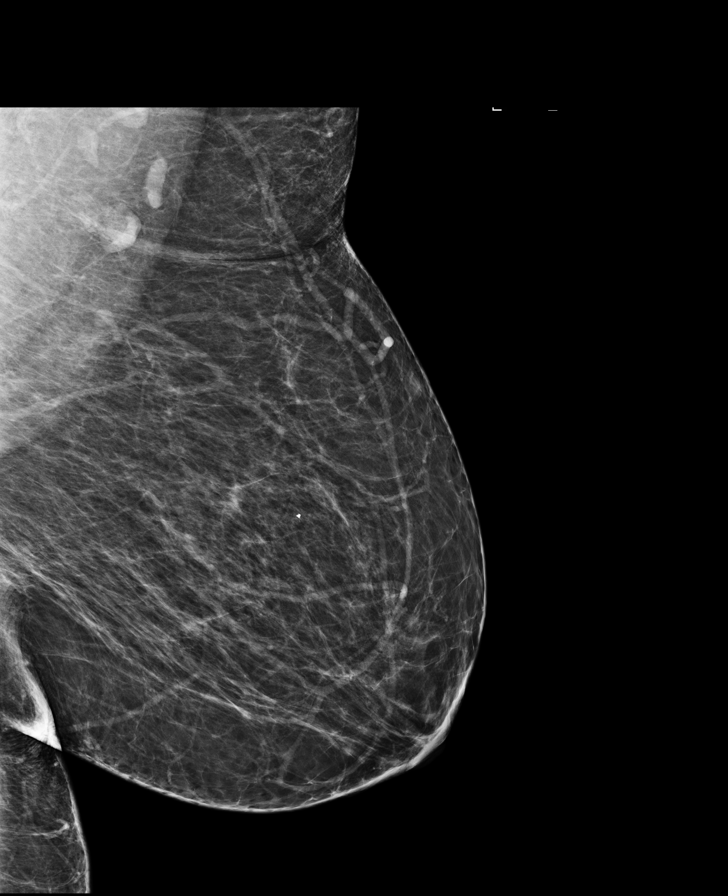

[R CC]
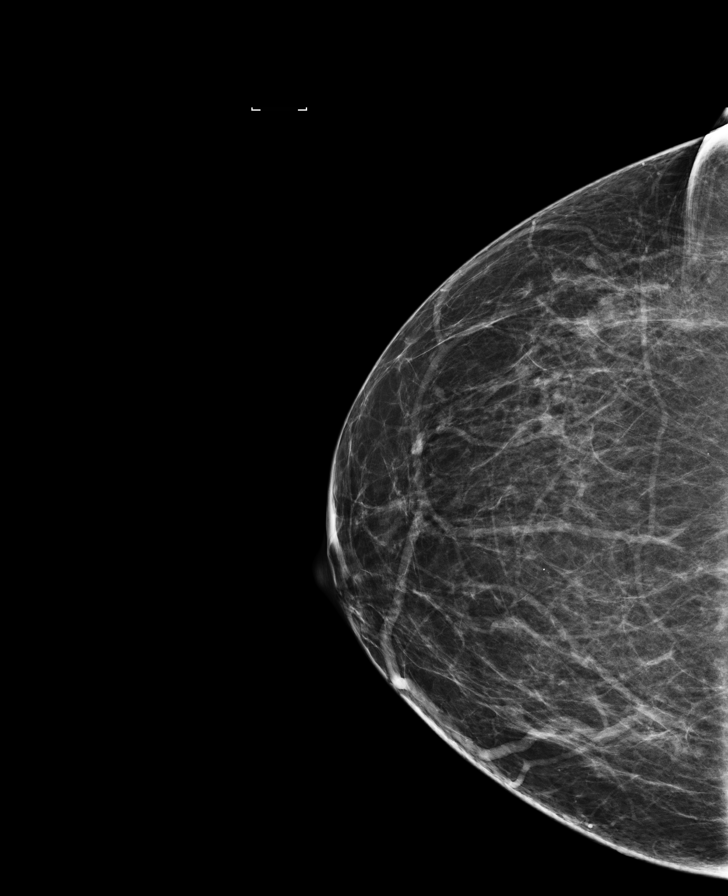

[L CC]
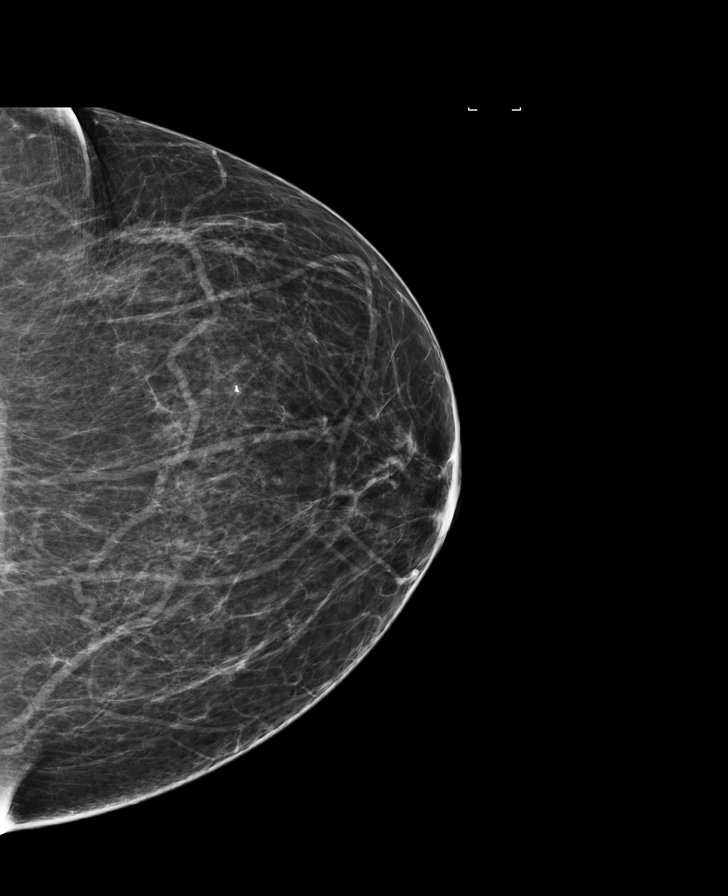

[L CC tomo · tomo slice 39/76.0]
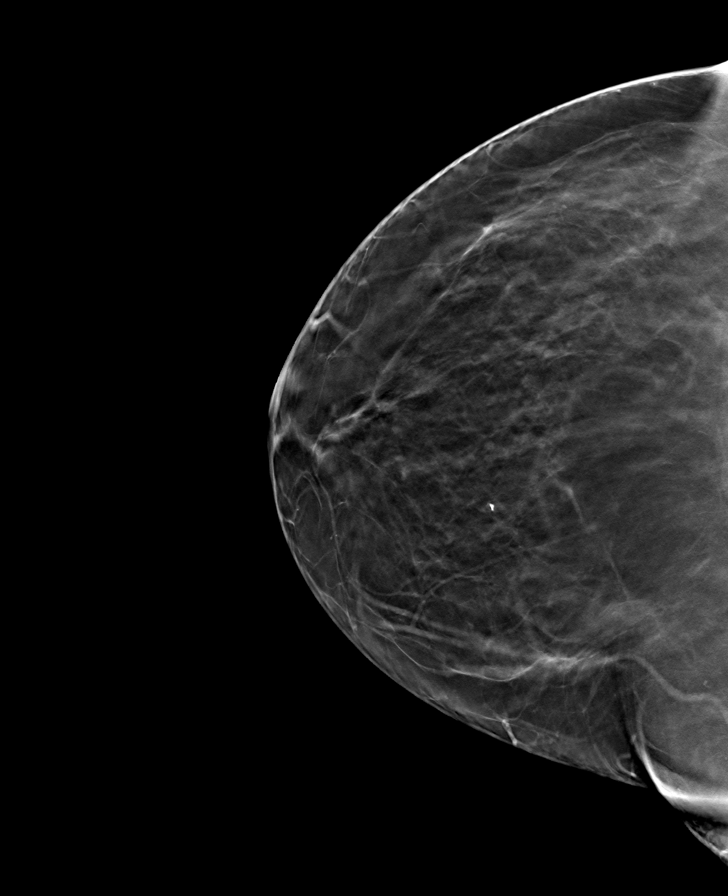

[R CC tomo · tomo slice 38/75.0]
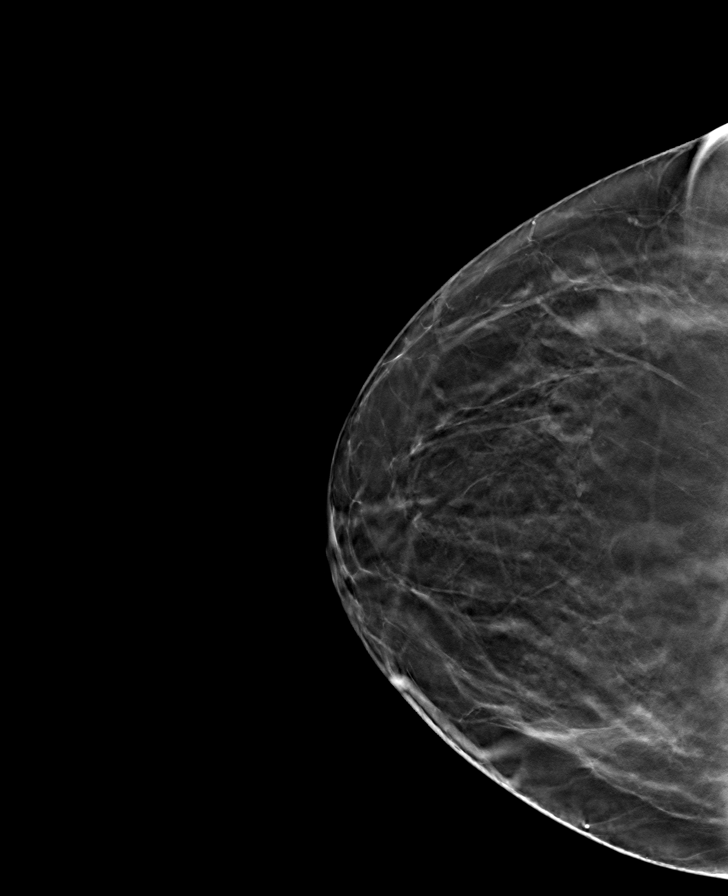

[R MLO tomo · tomo slice 41/82.0]
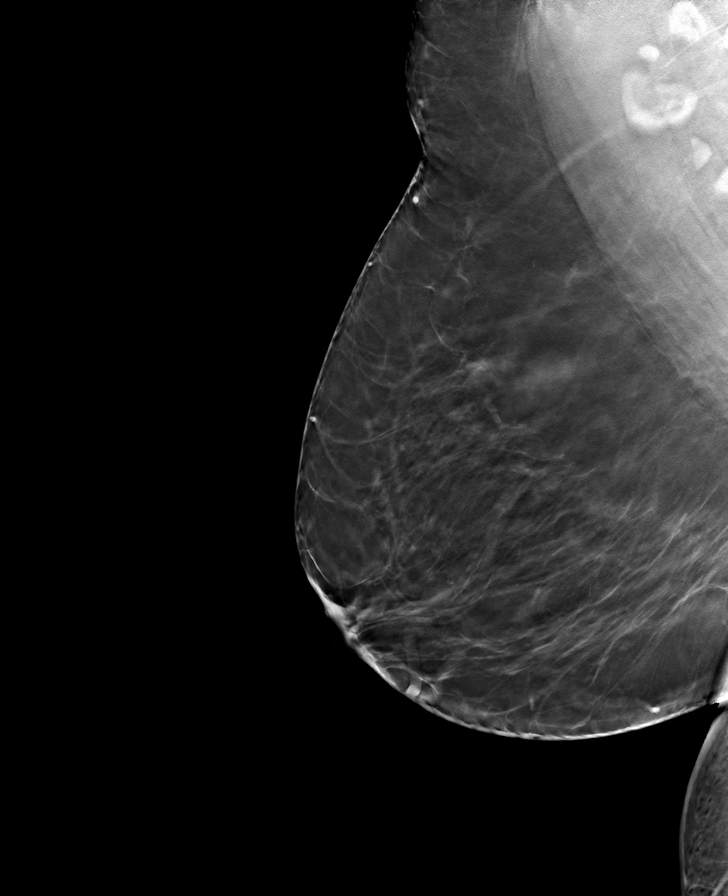

[L MLO tomo · tomo slice 45/90.0]
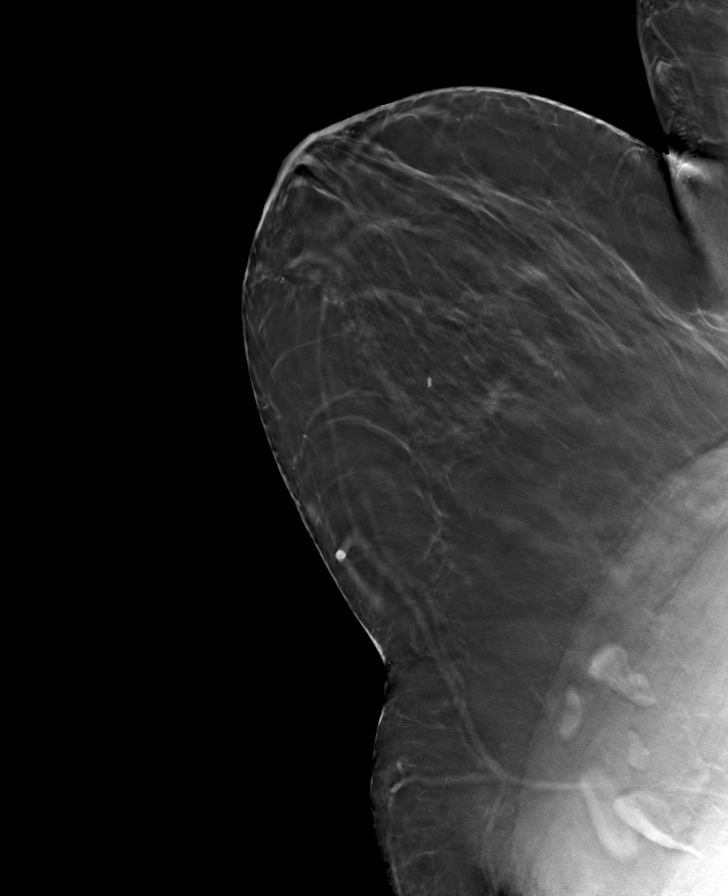

[8 of 24 positions shown; findings below may reference images not displayed]

ACR Breast Density Category b: There are scattered areas of
fibroglandular density.
FINDINGS: The mass identified within the left breast, upper outer quadrant, on
previous mammogram has nearly completely resolved in the interval.
The associated dystrophic calcification is unchanged.

There are no new dominant masses, suspicious calcifications or
secondary signs of malignancy within either breast.

Mammographic images were processed with CAD.

Targeted ultrasound is performed of the upper-outer quadrant of the
left breast. Previous ultrasound showed a probably benign
fibroadenoma within the left breast at the 2 o'clock axis, 5 cm from
the nipple, measuring 0.7 x 0.4 x 0.5 cm. On today's ultrasound, the
mass is no longer identified.

There are no suspicious solid or cystic masses within the
upper-outer quadrant of the left breast.
IMPRESSION: No evidence of malignancy within either breast.

The probably benign degenerating fibroadenoma within the left
breast, described on previous studies, is significantly decreased in
size indicating benignity.

Patient may return to routine annual bilateral screening mammogram
schedule.

RECOMMENDATION:
Screening mammogram in one year.(Code:[CL])

I have discussed the findings and recommendations with the patient.
Results were also provided in writing at the conclusion of the
visit. If applicable, a reminder letter will be sent to the patient
regarding the next appointment.

BI-RADS CATEGORY  2: Benign.

## 2015-04-10 ENCOUNTER — Encounter: Payer: BC Managed Care – PPO | Admitting: Gynecology

## 2015-04-10 ENCOUNTER — Other Ambulatory Visit: Payer: BLUE CROSS/BLUE SHIELD

## 2015-05-02 ENCOUNTER — Ambulatory Visit (INDEPENDENT_AMBULATORY_CARE_PROVIDER_SITE_OTHER): Payer: BC Managed Care – PPO | Admitting: Gynecology

## 2015-05-02 ENCOUNTER — Encounter: Payer: Self-pay | Admitting: Gynecology

## 2015-05-02 VITALS — BP 126/74 | Ht 65.0 in | Wt 249.6 lb

## 2015-05-02 DIAGNOSIS — N952 Postmenopausal atrophic vaginitis: Secondary | ICD-10-CM

## 2015-05-02 DIAGNOSIS — Z01419 Encounter for gynecological examination (general) (routine) without abnormal findings: Secondary | ICD-10-CM | POA: Diagnosis not present

## 2015-05-02 DIAGNOSIS — Z780 Asymptomatic menopausal state: Secondary | ICD-10-CM | POA: Diagnosis not present

## 2015-05-02 DIAGNOSIS — Z1159 Encounter for screening for other viral diseases: Secondary | ICD-10-CM | POA: Diagnosis not present

## 2015-05-02 MED ORDER — ESTROGENS, CONJUGATED 0.625 MG/GM VA CREA: TOPICAL_CREAM | VAGINAL | Status: DC

## 2015-05-02 NOTE — Patient Instructions (Addendum)
Conjugated Estrogens vaginal cream What is this medicine? CONJUGATED ESTROGENS (CON ju gate ed ESS troe jenz) are a mixture of female hormones. This cream can help relieve symptoms associated with menopause.like vaginal dryness and irritation. This medicine may be used for other purposes; ask your health care provider or pharmacist if you have questions. What should I tell my health care provider before I take this medicine? They need to know if you have any of these conditions: -abnormal vaginal bleeding -blood vessel disease or blood clots -breast, cervical, endometrial, or uterine cancer -dementia -diabetes -gallbladder disease -heart disease or recent heart attack -high blood pressure -high cholesterol -high level of calcium in the blood -hysterectomy -kidney disease -liver disease -migraine headaches -protein C deficiency -protein S deficiency -stroke -systemic lupus erythematosus (SLE) -tobacco smoker -an unusual or allergic reaction to estrogens other medicines, foods, dyes, or preservatives -pregnant or trying to get pregnant -breast-feeding How should I use this medicine? This medicine is for use in the vagina only. Do not take by mouth. Follow the directions on the prescription label. Use at bedtime unless otherwise directed by your doctor or health care professional. Use the special applicator supplied with the cream. Wash hands before and after use. Fill the applicator with the cream and remove from the tube. Lie on your back, part and bend your knees. Insert the applicator into the vagina and push the plunger to expel the cream into the vagina. Wash the applicator with warm soapy water and rinse well. Use exactly as directed for the complete length of time prescribed. Do not stop using except on the advice of your doctor or health care professional. Talk to your pediatrician regarding the use of this medicine in children. Special care may be needed. A patient package  insert for the product will be given with each prescription and refill. Read this sheet carefully each time. The sheet may change frequently. Overdosage: If you think you have taken too much of this medicine contact a poison control center or emergency room at once. NOTE: This medicine is only for you. Do not share this medicine with others. What if I miss a dose? If you miss a dose, use it as soon as you can. If it is almost time for your next dose, use only that dose. Do not use double or extra doses. What may interact with this medicine? Do not take this medicine with any of the following medications: -aromatase inhibitors like aminoglutethimide, anastrozole, exemestane, letrozole, testolactone This medicine may also interact with the following medications: -barbiturates used for inducing sleep or treating seizures -carbamazepine -grapefruit juice -medicines for fungal infections like itraconazole and ketoconazole -raloxifene or tamoxifen -rifabutin -rifampin -rifapentine -ritonavir -some antibiotics used to treat infections -St. John's Wort -warfarin This list may not describe all possible interactions. Give your health care provider a list of all the medicines, herbs, non-prescription drugs, or dietary supplements you use. Also tell them if you smoke, drink alcohol, or use illegal drugs. Some items may interact with your medicine. What should I watch for while using this medicine? Visit your health care professional for regular checks on your progress. You will need a regular breast and pelvic exam. You should also discuss the need for regular mammograms with your health care professional, and follow his or her guidelines. This medicine can make your body retain fluid, making your fingers, hands, or ankles swell. Your blood pressure can go up. Contact your doctor or health care professional if you feel you are retaining   fluid. If you have any reason to think you are pregnant; stop  taking this medicine at once and contact your doctor or health care professional. Tobacco smoking increases the risk of getting a blood clot or having a stroke, especially if you are more than 63 years old. You are strongly advised not to smoke. If you wear contact lenses and notice visual changes, or if the lenses begin to feel uncomfortable, consult your eye care specialist. If you are going to have elective surgery, you may need to stop taking this medicine beforehand. Consult your health care professional for advice prior to scheduling the surgery. What side effects may I notice from receiving this medicine? Side effects that you should report to your doctor or health care professional as soon as possible: -allergic reactions like skin rash, itching or hives, swelling of the face, lips, or tongue -breast tissue changes or discharge -changes in vision -chest pain -confusion, trouble speaking or understanding -dark urine -general ill feeling or flu-like symptoms -light-colored stools -nausea, vomiting -pain, swelling, warmth in the leg -right upper belly pain -severe headaches -shortness of breath -sudden numbness or weakness of the face, arm or leg -trouble walking, dizziness, loss of balance or coordination -unusual vaginal bleeding -yellowing of the eyes or skin Side effects that usually do not require medical attention (report to your doctor or health care professional if they continue or are bothersome): -hair loss -increased hunger or thirst -increased urination -symptoms of vaginal infection like itching, irritation or unusual discharge -unusually weak or tired This list may not describe all possible side effects. Call your doctor for medical advice about side effects. You may report side effects to FDA at 1-800-FDA-1088. Where should I keep my medicine? Keep out of the reach of children. Store at room temperature between 15 and 30 degrees C (59 and 86 degrees F). Throw away  any unused medicine after the expiration date. NOTE: This sheet is a summary. It may not cover all possible information. If you have questions about this medicine, talk to your doctor, pharmacist, or health care provider.    2016, Elsevier/Gold Standard. (2010-08-06 09:20:36) Bone Densitometry Bone densitometry is an imaging test that uses a special X-ray to measure the amount of calcium and other minerals in your bones (bone density). This test is also known as a bone mineral density test or dual-energy X-ray absorptiometry (DXA). The test can measure bone density at your hip and your spine. It is similar to having a regular X-ray. You may have this test to:  Diagnose a condition that causes weak or thin bones (osteoporosis).  Predict your risk of a broken bone (fracture).  Determine how well osteoporosis treatment is working. LET [facility] CARE PROVIDER KNOW ABOUT:  Any allergies you have.  All medicines you are taking, including vitamins, herbs, eye drops, creams, and over-the-counter medicines.  Previous problems you or members of your family have had with the use of anesthetics.  Any blood disorders you have.  Previous surgeries you have had.  Medical conditions you have.  Possibility of pregnancy.  Any other medical test you had within the previous 14 days that used contrast material. RISKS AND COMPLICATIONS Generally, this is a safe procedure. However, problems can occur and may include the following:  This test exposes you to a very small amount of radiation.  The risks of radiation exposure may be greater to unborn children. BEFORE THE PROCEDURE  Do not take any calcium supplements for 24 hours before having the  test. You can otherwise eat and drink what you usually do.  Take off all metal jewelry, eyeglasses, dental appliances, and any other metal objects. PROCEDURE  You may lie on an exam table. There will be an X-ray generator below you and an imaging device  above you.  Other devices, such as boxes or braces, may be used to position your body properly for the scan.  You will need to lie still while the machine slowly scans your body.  The images will show up on a computer monitor. AFTER THE PROCEDURE You may need more testing at a later time.   This information is not intended to replace advice given to you by your health care provider. Make sure you discuss any questions you have with your health care provider.   Document Released: 05/26/2004 Document Revised: 05/25/2014 Document Reviewed: 10/12/2013 Elsevier Interactive Patient Education [employer].

## 2015-05-02 NOTE — Progress Notes (Signed)
Bianca Medina [birth year] [id number]   History:    64 y.o.  for annual gyn exam with the only complaining penis of vaginal dryness and irritation other she is not sexually active. Her PCP is Dr. Marlou Sa is been doing her blood work. But on questioning she has not been screen for hep C according to the [facility] guidelines as of yet. Her PCP has been treating her hypertension and type 2 diabetes. She was weighing 253 pounds and is down to 249 pounds.  Review of patient's records indicated that in 2004 she had a total abdominal hysterectomy for symptomatic leiomyomatous uteri. Pathology report benign. Patient prior to that had tubal ligation. Patient with past history of colon polyps last colonoscopy was in 2007. She had a colonoscopy in 2012. She had a normal bone density study in 2014. Patient with no prior abnormal Pap smears before her hysterectomy. Patient was never on any HRT in the past.   Past medical history,surgical history, family history and social history were all reviewed and documented in the EPIC chart.  Gynecologic History No LMP recorded. Patient has had a hysterectomy. Contraception: status post hysterectomy Last Pap: 2011. Results were: normal Last mammogram: 2016. Results were: Left breast fibroadenoma  Obstetric History OB History  Gravida Para Term Preterm AB SAB TAB Ectopic Multiple Living  3 3        3     # Outcome Date GA Lbr Len/2nd Weight Sex Delivery Anes PTL Lv  3 Para           2 Para           1 Para                ROS: A ROS was performed and pertinent positives and negatives are included in the history.  GENERAL: No fevers or chills. HEENT: No change in vision, no earache, sore throat or sinus congestion. NECK: No pain or stiffness. CARDIOVASCULAR: No chest pain or pressure. No palpitations. PULMONARY: No shortness of breath, cough or wheeze. GASTROINTESTINAL: No abdominal pain, nausea, vomiting or diarrhea, melena or bright red blood per rectum.  GENITOURINARY: No urinary frequency, urgency, hesitancy or dysuria. MUSCULOSKELETAL: No joint or muscle pain, no back pain, no recent trauma. DERMATOLOGIC: No rash, no itching, no lesions. ENDOCRINE: No polyuria, polydipsia, no heat or cold intolerance. No recent change in weight. HEMATOLOGICAL: No anemia or easy bruising or bleeding. NEUROLOGIC: No headache, seizures, numbness, tingling or weakness. PSYCHIATRIC: No depression, no loss of interest in normal activity or change in sleep pattern.     Exam: chaperone present  BP 126/74 mmHg  Ht 5\' 5"  (1.651 m)  Wt 249 lb 9.6 oz (113.218 kg)  BMI 41.54 kg/m2  Body mass index is 41.54 kg/(m^2).  General appearance : Well developed well nourished female. No acute distress HEENT: Eyes: no retinal hemorrhage or exudates,  Neck supple, trachea midline, no carotid bruits, no thyroidmegaly Lungs: Clear to auscultation, no rhonchi or wheezes, or rib retractions  Heart: Regular rate and rhythm, no murmurs or gallops Breast:Examined in sitting and supine position were symmetrical in appearance, no palpable masses or tenderness,  no skin retraction, no nipple inversion, no nipple discharge, no skin discoloration, no axillary or supraclavicular lymphadenopathy Abdomen: no palpable masses or tenderness, no rebound or guarding Extremities: no edema or skin discoloration or tenderness  Pelvic:  Bartholin, Urethra, Skene Glands: Within normal limits             Vagina: No gross  lesions or discharge, atrophic changes  Cervix: Absent  Uterus  absent  Adnexa  Without masses or tenderness  Anus and perineum  normal   Rectovaginal  normal sphincter tone without palpated masses or tenderness             Hemoccult cards provided     Assessment/Plan:  64 y.o. female for annual exam will be prescribed Premarin vaginal cream to apply twice a week for vaginal atrophy. Risk benefits and pros and cons discussed. Pap smear no longer needed. Patient received her flu  vaccine along with her blood work her PCP office. She will schedule her bone density study for later this month. We discussed importance of calcium vitamin D and regular exercise for osteoporosis prevention. When she returns for her bone density study she will bring the fecal Hemoccult cards for testing.  New CDC guidelines is recommending patients be tested once in her lifetime for hepatitis C antibody who were born between [birth year] through 1964. This was discussed with the patient today and has agreed to be tested today.   Terrance Mass MD, 2:32 PM 05/02/2015

## 2015-05-03 ENCOUNTER — Telehealth: Payer: Self-pay | Admitting: *Deleted

## 2015-05-03 LAB — HEPATITIS C ANTIBODY: HCV Ab: NEGATIVE

## 2015-05-03 MED ORDER — ESTROGENS, CONJUGATED 0.625 MG/GM VA CREA: TOPICAL_CREAM | VAGINAL | Status: DC

## 2015-05-03 NOTE — Telephone Encounter (Signed)
Rx sent 

## 2015-05-03 NOTE — Telephone Encounter (Signed)
Premarin vaginal cream: 0.5 gm PV 2 X Wk # 1 Tube  Refill x 10

## 2015-05-03 NOTE — Telephone Encounter (Signed)
Pt was prescribed premarin vaginal cream with directions twice weekly, the pharmacy can not fill Rx with those directions, they need it to say dose in grams. How many grams should pt have 1 gram twice weekly ? Please advise

## 2015-06-11 ENCOUNTER — Other Ambulatory Visit: Payer: Self-pay | Admitting: Gynecology

## 2015-06-11 ENCOUNTER — Ambulatory Visit (INDEPENDENT_AMBULATORY_CARE_PROVIDER_SITE_OTHER): Payer: BC Managed Care – PPO

## 2015-06-11 DIAGNOSIS — Z780 Asymptomatic menopausal state: Secondary | ICD-10-CM

## 2015-06-11 DIAGNOSIS — Z13820 Encounter for screening for osteoporosis: Secondary | ICD-10-CM

## 2015-06-13 ENCOUNTER — Other Ambulatory Visit: Payer: Self-pay | Admitting: *Deleted

## 2015-06-13 DIAGNOSIS — Z780 Asymptomatic menopausal state: Secondary | ICD-10-CM

## 2015-06-14 ENCOUNTER — Other Ambulatory Visit: Payer: BC Managed Care – PPO

## 2015-06-14 DIAGNOSIS — Z780 Asymptomatic menopausal state: Secondary | ICD-10-CM

## 2015-06-14 LAB — CALCIUM: Calcium: 8.9 mg/dL (ref 8.6–10.4)

## 2015-06-17 LAB — VITAMIN D 1,25 DIHYDROXY
VITAMIN D 1, 25 (OH) TOTAL: 37 pg/mL (ref 18–72)
Vitamin D2 1, 25 (OH)2: <8 pg/mL
Vitamin D3 1, 25 (OH)2: 37 pg/mL

## 2015-06-21 ENCOUNTER — Other Ambulatory Visit: Payer: BC Managed Care – PPO | Admitting: Anesthesiology

## 2015-06-21 DIAGNOSIS — Z1211 Encounter for screening for malignant neoplasm of colon: Secondary | ICD-10-CM

## 2015-07-22 ENCOUNTER — Ambulatory Visit
Admission: RE | Admit: 2015-07-22 | Discharge: 2015-07-22 | Disposition: A | Discharge status: Home / Self Care | Payer: BC Managed Care – PPO | Source: Ambulatory Visit | Attending: Internal Medicine | Admitting: Internal Medicine

## 2015-07-22 ENCOUNTER — Other Ambulatory Visit: Payer: Self-pay | Admitting: Internal Medicine

## 2015-07-22 DIAGNOSIS — M25511 Pain in right shoulder: Secondary | ICD-10-CM

## 2015-07-22 IMAGING — CR DG SHOULDER 2+V*R*
4 series · 4 of 4 positions shown · non-contrast
Comparison: None in PACs

CLINICAL DATA: Right anterior and lateral shoulder pain since a flu
shot in [DATE]. Range of motion is preserved.

EXAM:
RIGHT SHOULDER - 2+ VIEW

[view not recorded (1 of 4)]
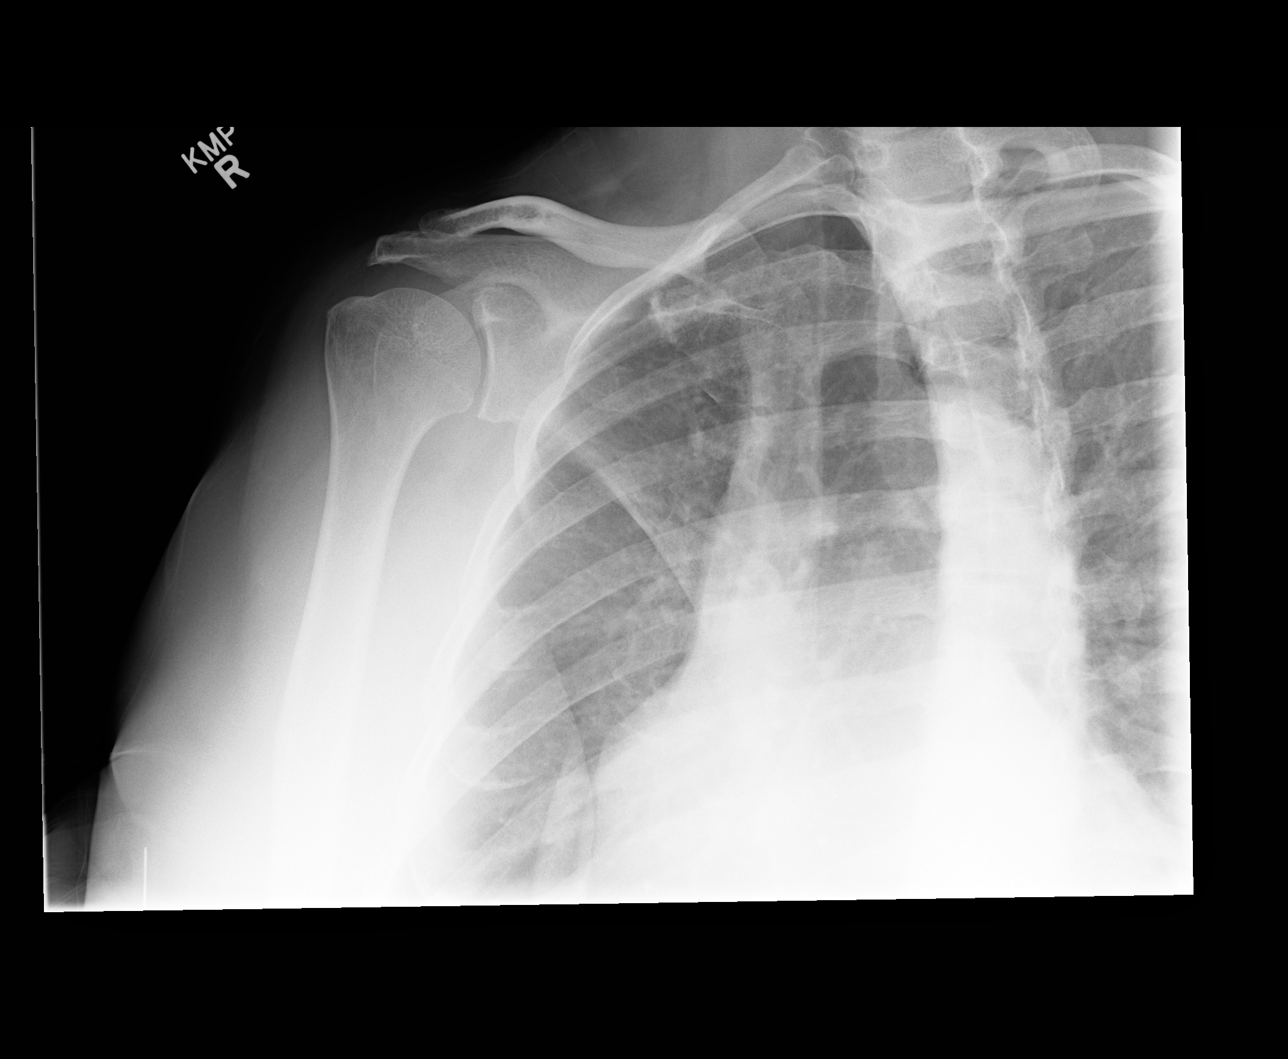

[view not recorded (2 of 4)]
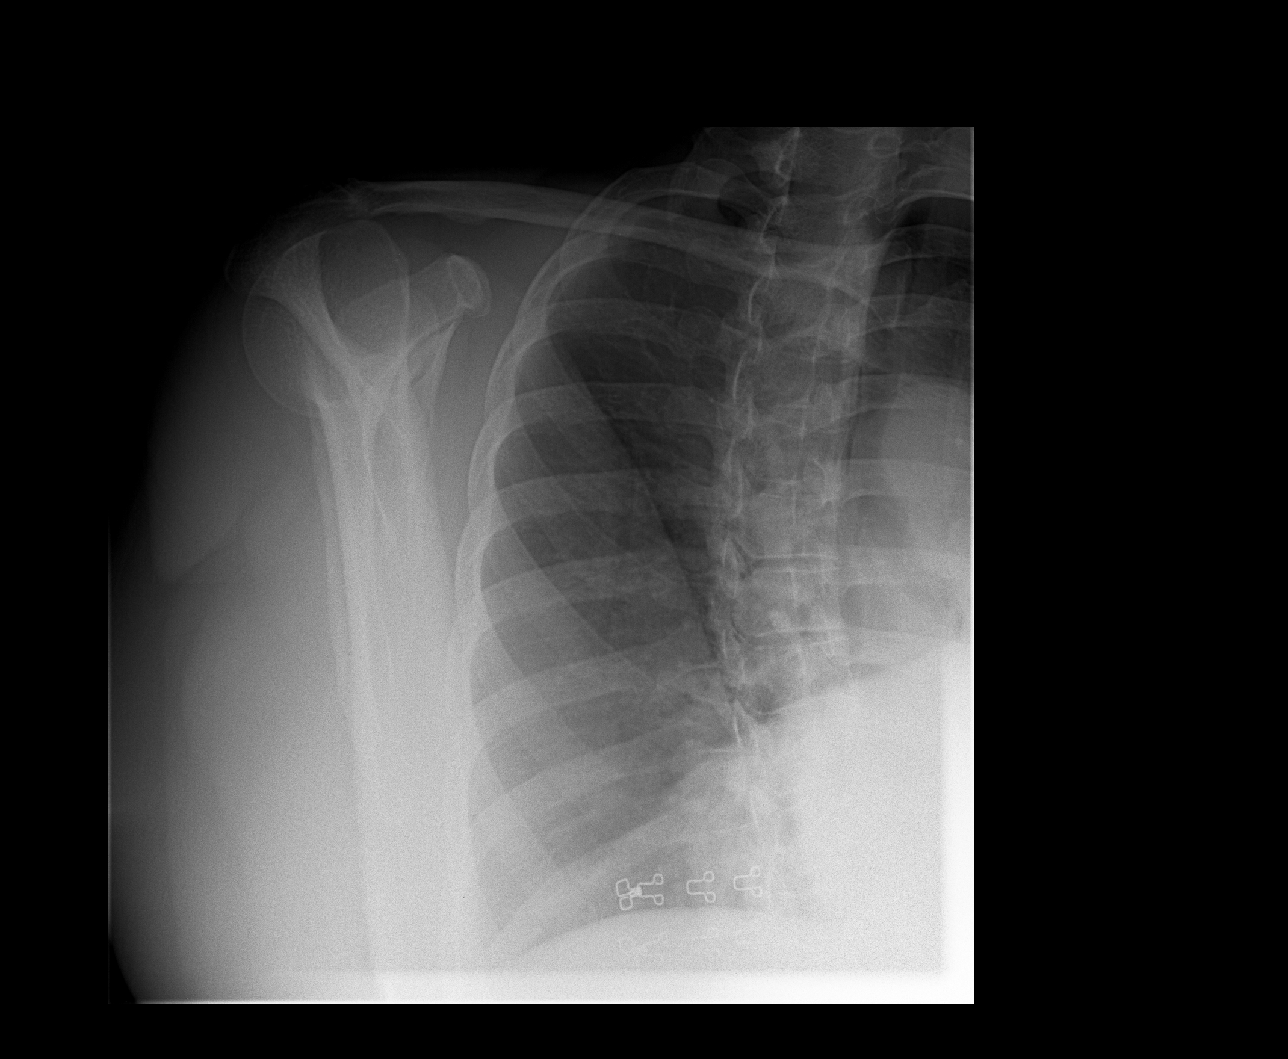

[view not recorded (3 of 4)]
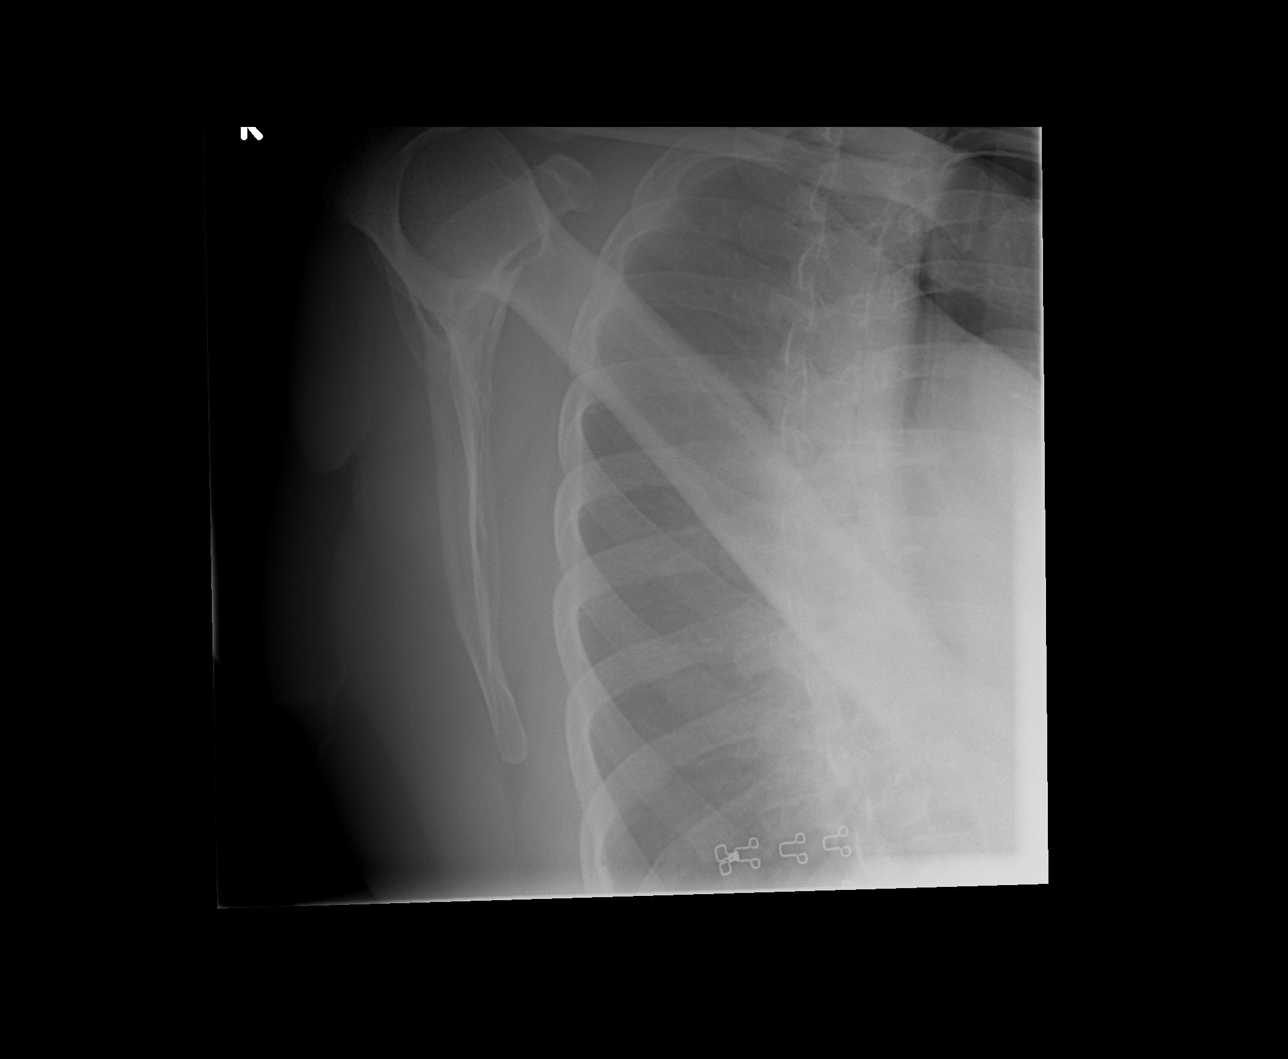

[view not recorded (4 of 4)]
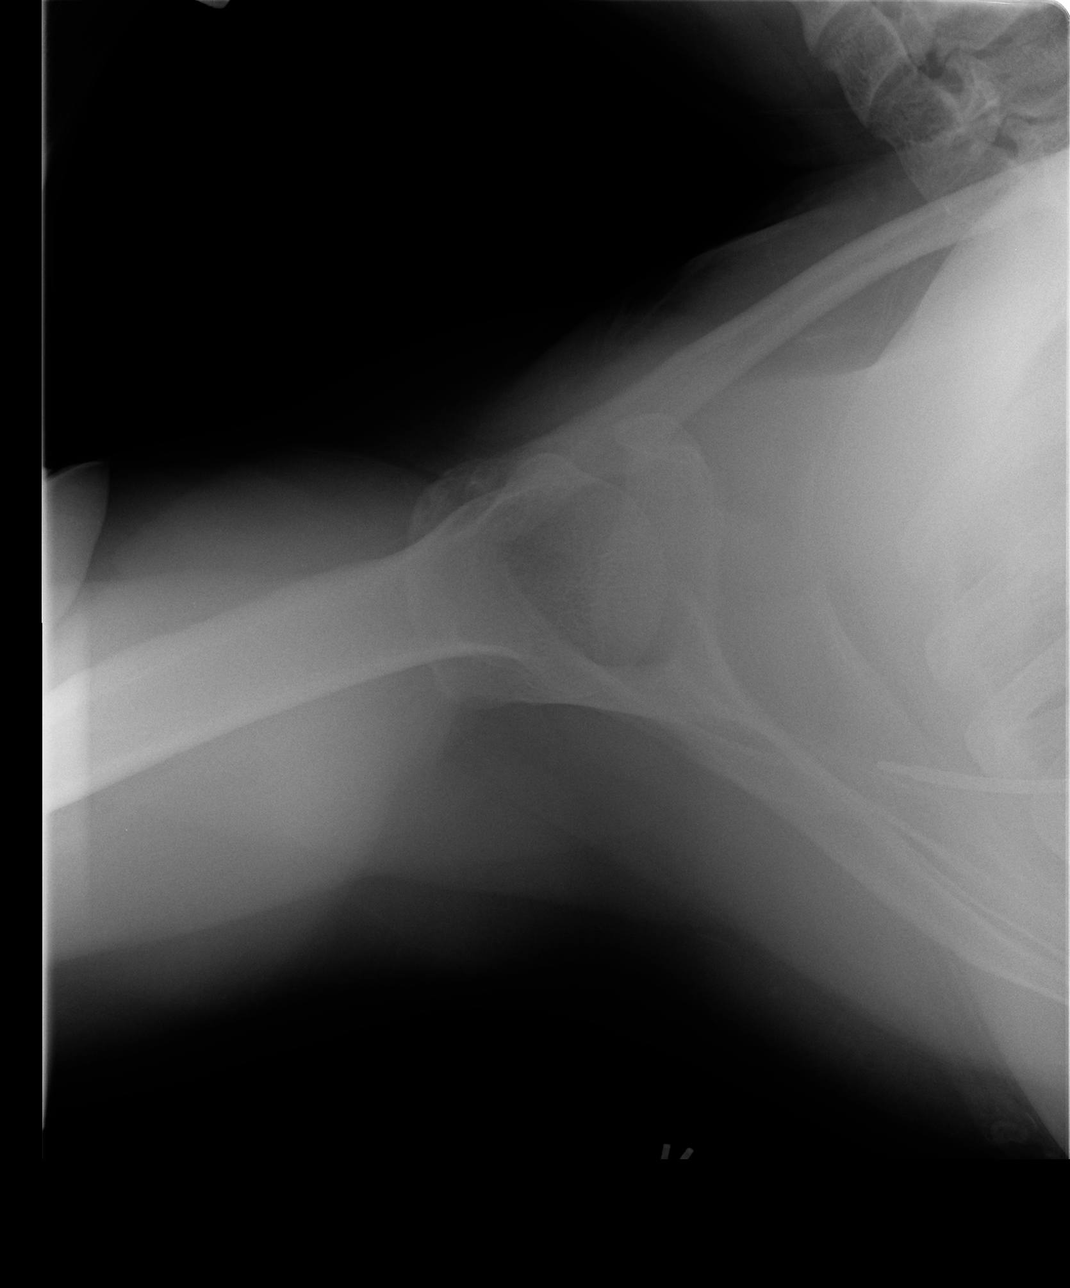

[4 of 4 positions shown; findings below may reference images not displayed]

FINDINGS: The bones of the right shoulder are adequately mineralized. The
joint spaces are reasonably well-maintained. There is no fracture or
dislocation. The soft tissues of the shoulder are normal. The
observed portions of the upper right ribs and right upper lobe of
the lung are normal.
IMPRESSION: There is no acute or chronic bony abnormality of the right shoulder.
The soft tissues are unremarkable as well.

## 2015-12-30 ENCOUNTER — Encounter: Payer: Self-pay | Admitting: Gynecology

## 2016-02-24 ENCOUNTER — Other Ambulatory Visit: Payer: Self-pay | Admitting: Gynecology

## 2016-02-24 DIAGNOSIS — Z1231 Encounter for screening mammogram for malignant neoplasm of breast: Secondary | ICD-10-CM

## 2016-05-06 ENCOUNTER — Encounter: Payer: Self-pay | Admitting: Gynecology

## 2016-05-06 ENCOUNTER — Ambulatory Visit (INDEPENDENT_AMBULATORY_CARE_PROVIDER_SITE_OTHER): Payer: BC Managed Care – PPO | Admitting: Gynecology

## 2016-05-06 ENCOUNTER — Ambulatory Visit
Admission: RE | Admit: 2016-05-06 | Discharge: 2016-05-06 | Disposition: A | Discharge status: Home / Self Care | Payer: BC Managed Care – PPO | Source: Ambulatory Visit | Attending: Gynecology | Admitting: Gynecology

## 2016-05-06 VITALS — BP 128/80 | Ht 65.0 in | Wt 251.0 lb

## 2016-05-06 DIAGNOSIS — Z01419 Encounter for gynecological examination (general) (routine) without abnormal findings: Secondary | ICD-10-CM | POA: Diagnosis not present

## 2016-05-06 DIAGNOSIS — Z1231 Encounter for screening mammogram for malignant neoplasm of breast: Secondary | ICD-10-CM

## 2016-05-06 IMAGING — MG 2D DIGITAL SCREENING BILATERAL MAMMOGRAM WITH CAD AND ADJUNCT TO
8 of 12 series · 8 of 28 positions shown · non-contrast
Comparison: Previous exam(s).

CLINICAL DATA: Screening.

EXAM:
2D DIGITAL SCREENING BILATERAL MAMMOGRAM WITH CAD AND ADJUNCT TOMO

[R MLO]
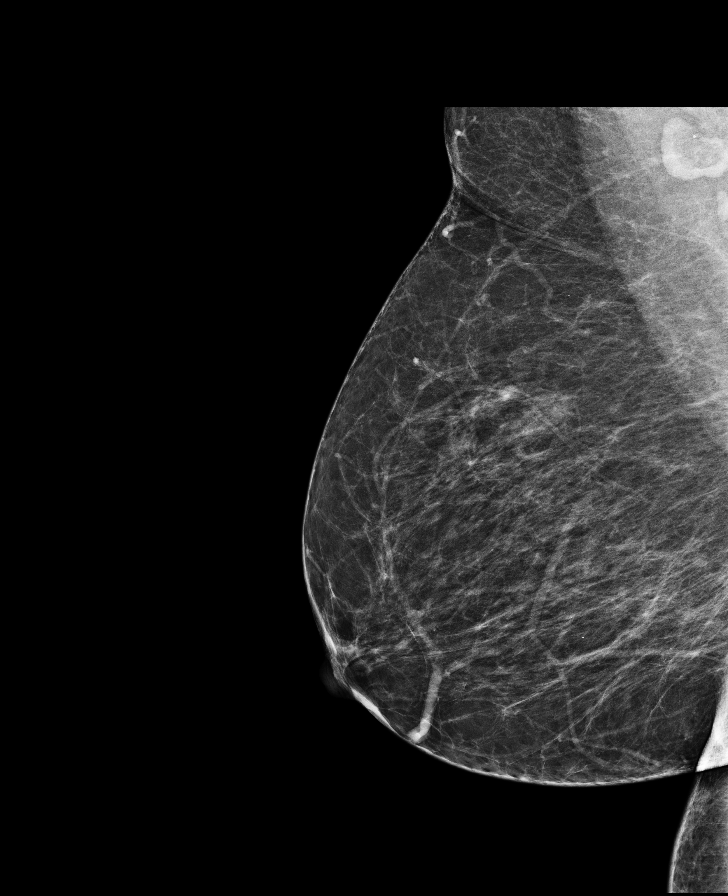

[R CC synth-2D]
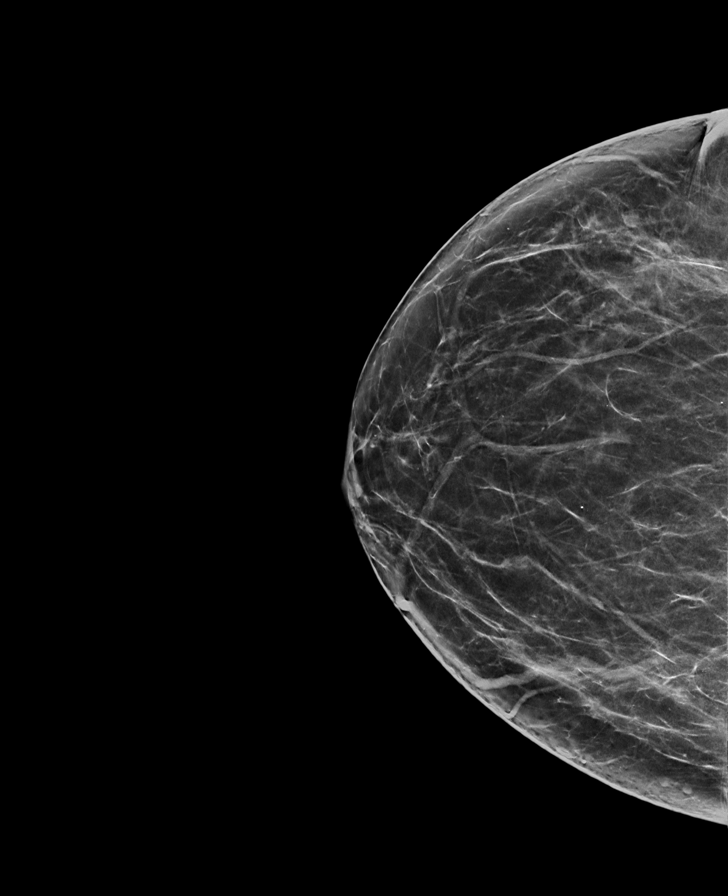

[L MLO]
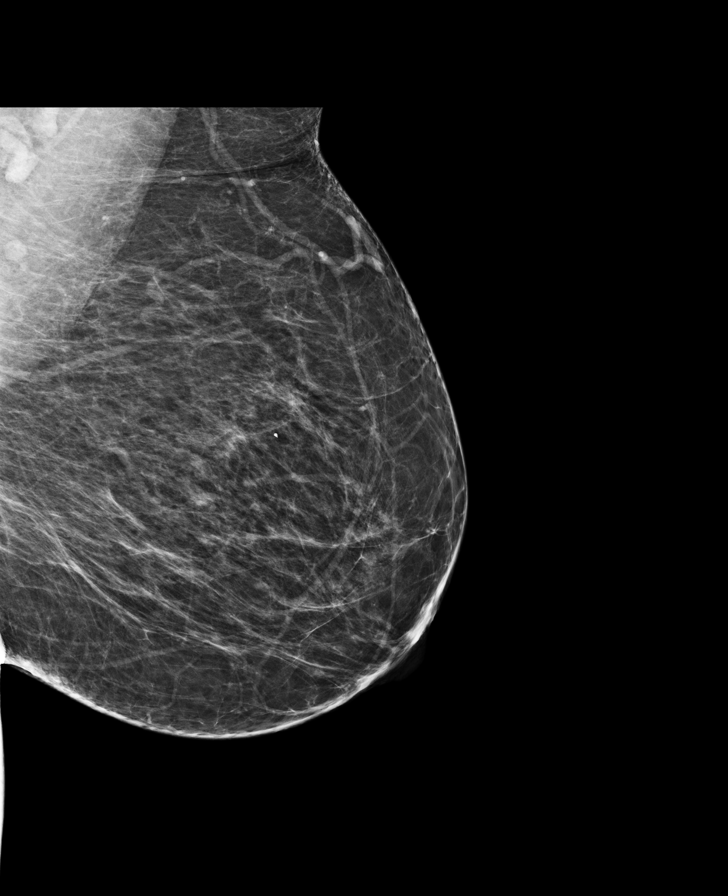

[R MLO synth-2D]
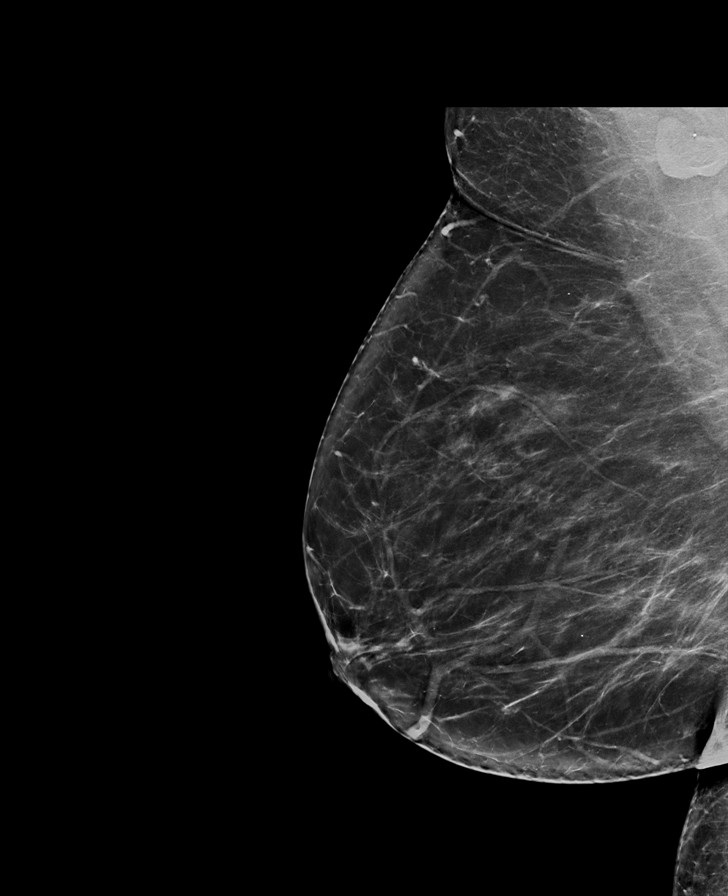

[L CC]
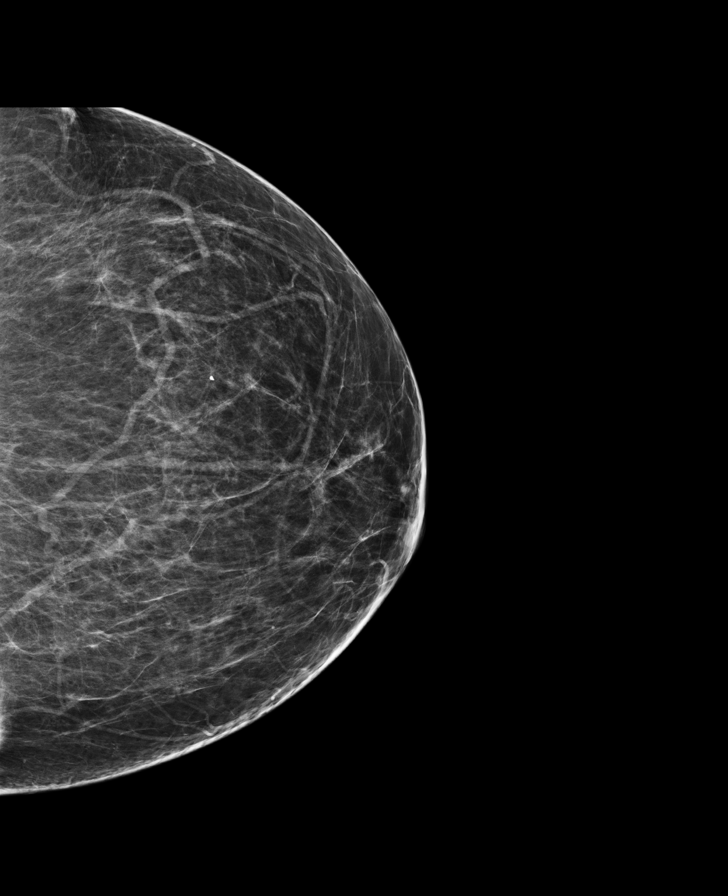

[R CC]
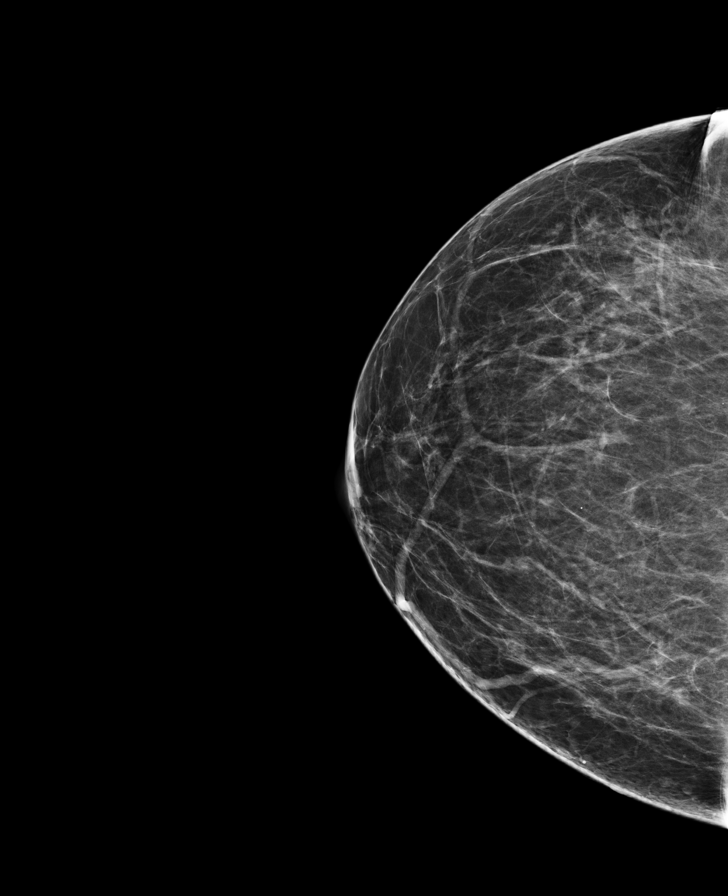

[L MLO synth-2D]
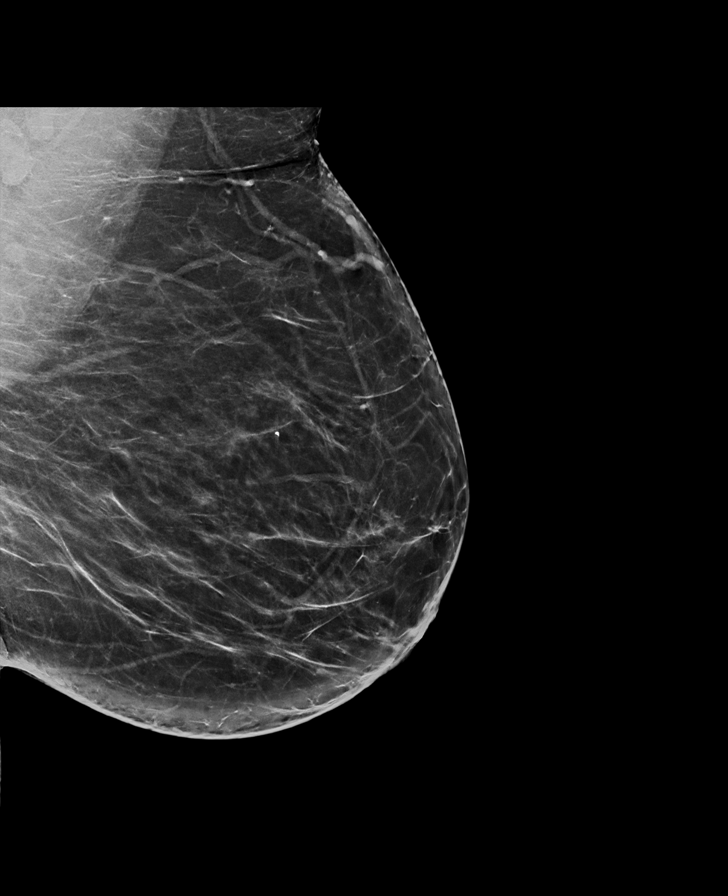

[L CC synth-2D]
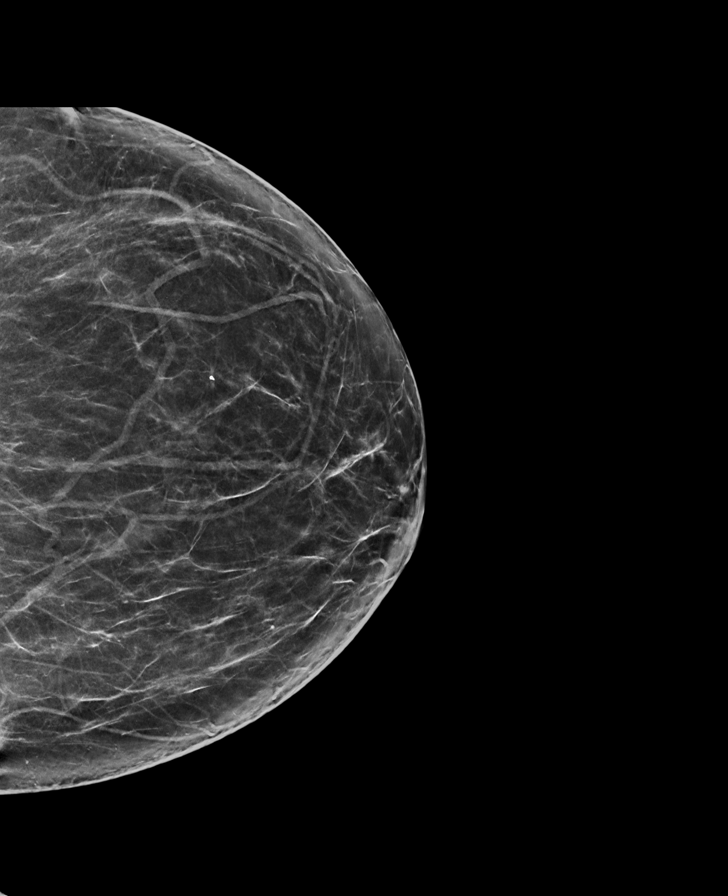

[8 of 28 positions shown; findings below may reference images not displayed]

ACR Breast Density Category b: There are scattered areas of
fibroglandular density.
FINDINGS: There are no findings suspicious for malignancy. Images were
processed with CAD.
IMPRESSION: No mammographic evidence of malignancy. A result letter of this
screening mammogram will be mailed directly to the patient.

RECOMMENDATION:
Screening mammogram in one year. (Code:[33])

BI-RADS CATEGORY  1: Negative.

## 2016-05-06 NOTE — Progress Notes (Signed)
Bianca Medina 1951-03-16 [id number]   History:    65 y.o.  for annual gyn exam with no complaints today. Her PCP is Dr. Marlou Sa who is been doing her blood work and has been treating her for hypertension and type 2 diabetes. Review of patient's records indicated that in 2004 she had a total abdominal hysterectomy for symptomatic leiomyomatous uteri. Pathology report benign. Patient prior to that had tubal ligation. Patient with past history of colon polyps last colonoscopy was in 2007. Patient had a colonoscopy this year. Patient had a normal bone density this year. Pap smears prior to hysterectomy have been normal. Patient has never been on any hormone replacement therapy.    Past medical history,surgical history, family history and social history were all reviewed and documented in the EPIC chart.  Gynecologic History No LMP recorded. Patient has had a hysterectomy. Contraception: post menopausal status Last Pap: 2012. Results were: normal Last mammogram: 2017. Results were: normal  Obstetric History OB History  Gravida Para Term Preterm AB Living  3 3       3   SAB TAB Ectopic Multiple Live Births               # Outcome Date GA Lbr Len/2nd Weight Sex Delivery Anes PTL Lv  3 Para           2 Para           1 Para                ROS: A ROS was performed and pertinent positives and negatives are included in the history.  GENERAL: No fevers or chills. HEENT: No change in vision, no earache, sore throat or sinus congestion. NECK: No pain or stiffness. CARDIOVASCULAR: No chest pain or pressure. No palpitations. PULMONARY: No shortness of breath, cough or wheeze. GASTROINTESTINAL: No abdominal pain, nausea, vomiting or diarrhea, melena or bright red blood per rectum. GENITOURINARY: No urinary frequency, urgency, hesitancy or dysuria. MUSCULOSKELETAL: No joint or muscle pain, no back pain, no recent trauma. DERMATOLOGIC: No rash, no itching, no lesions. ENDOCRINE: No polyuria, polydipsia,  no heat or cold intolerance. No recent change in weight. HEMATOLOGICAL: No anemia or easy bruising or bleeding. NEUROLOGIC: No headache, seizures, numbness, tingling or weakness. PSYCHIATRIC: No depression, no loss of interest in normal activity or change in sleep pattern.     Exam: chaperone present  BP 128/80   Ht 5\' 5"  (1.651 m)   Wt 251 lb (113.9 kg)   BMI 41.77 kg/m   Body mass index is 41.77 kg/m.  General appearance : Well developed well nourished female. No acute distress HEENT: Eyes: no retinal hemorrhage or exudates,  Neck supple, trachea midline, no carotid bruits, no thyroidmegaly Lungs: Clear to auscultation, no rhonchi or wheezes, or rib retractions  Heart: Regular rate and rhythm, no murmurs or gallops Breast:Examined in sitting and supine position were symmetrical in appearance, no palpable masses or tenderness,  no skin retraction, no nipple inversion, no nipple discharge, no skin discoloration, no axillary or supraclavicular lymphadenopathy Abdomen: no palpable masses or tenderness, no rebound or guarding Extremities: no edema or skin discoloration or tenderness  Pelvic:  Bartholin, Urethra, Skene Glands: Within normal limits             Vagina: No gross lesions or discharge  Cervix: Absent  Uterus  absent  Adnexa  Without masses or tenderness  Anus and perineum  normal   Rectovaginal  normal sphincter tone without palpated  masses or tenderness             Hemoccult patient had a colonoscopy this year     Assessment/Plan:  65 y.o. female for annual exam was reminded want again the importance of calcium vitamin D and weightbearing exercises for osteoporosis prevention. Also discussed importance of monthly breast exam. Her PCP has been doing her blood work and all her vaccines are up-to-date. Pap smear no longer indicated based on guidelines.   Terrance Mass MD, 2:56 PM 05/06/2016

## 2016-07-17 ENCOUNTER — Ambulatory Visit (INDEPENDENT_AMBULATORY_CARE_PROVIDER_SITE_OTHER): Payer: BC Managed Care – PPO | Admitting: Gynecology

## 2016-07-17 ENCOUNTER — Encounter: Payer: Self-pay | Admitting: Gynecology

## 2016-07-17 VITALS — BP 132/80

## 2016-07-17 DIAGNOSIS — Z1272 Encounter for screening for malignant neoplasm of vagina: Secondary | ICD-10-CM

## 2016-07-17 DIAGNOSIS — R350 Frequency of micturition: Secondary | ICD-10-CM | POA: Diagnosis not present

## 2016-07-17 DIAGNOSIS — N950 Postmenopausal bleeding: Secondary | ICD-10-CM | POA: Diagnosis not present

## 2016-07-17 LAB — URINALYSIS W MICROSCOPIC + REFLEX CULTURE
Bilirubin Urine: NEGATIVE
Casts: NONE SEEN [LPF]
Crystals: NONE SEEN [HPF]
Glucose, UA: NEGATIVE
Hgb urine dipstick: NEGATIVE
Ketones, ur: NEGATIVE
Leukocytes, UA: NEGATIVE
Nitrite: NEGATIVE
Protein, ur: NEGATIVE
RBC / HPF: NONE SEEN RBC/HPF (ref <=2)
SPECIFIC GRAVITY, URINE: 1.025 (ref 1.001–1.035)
YEAST: NONE SEEN [HPF]
pH: 5.5 (ref 5.0–8.0)

## 2016-07-17 NOTE — Progress Notes (Signed)
   Patient is a 66 year old who presented to the office stating that a few days ago she felt moist but no true discharge per se and last night she noticed that there was blood on her panties and when she wiped. Patient with prior hysterectomy over 10 years ago and on no hormone replacement therapy. She did state that yesterday she felt like she has some frequency in urination but no dysuria or any back pain or fever or chills or nausea or vomiting. Patient is not sexually active. Patient's last Pap smear was normal in 2011.  Exam: Gen. appearance well-developed well-nourished female in no acute distress Back: No CVA tenderness Abdomen: Soft nontender no rebound or guarding pelvic: Bartholin urethra Skene was within normal limits Vagina: No lesions or discharge vaginal cuff intact Bimanual exam unremarkable Rectal exam no evidence of any external hemorrhoids digital exam no palpable internal hemorrhoids  Fecal Hemoccult today: Negative  Urinalysis today: Moderate bacteria no white blood cell and no red blood cells seen culture pending  Assessment/plan: Patient with some form of postmenopausal bleeding I cannot determine a vaginal etiology or external genitalia. Fecal Hemoccult testing in the office today was negative. Urine no blood on the bacteria culture pending. We'll wait for the result of the urine culture in the next couple of days and if she does not hear from [place] in the next 72 hours for urine culture was negative and I would recommend she follow up with her gastroenterologist that she has had history of colon polyps most recently in 2017.

## 2016-07-18 LAB — URINE CULTURE

## 2016-07-20 ENCOUNTER — Other Ambulatory Visit: Payer: Self-pay | Admitting: Anesthesiology

## 2016-07-20 DIAGNOSIS — Z1211 Encounter for screening for malignant neoplasm of colon: Secondary | ICD-10-CM

## 2016-07-21 LAB — PAP IG W/ RFLX HPV ASCU

## 2016-09-30 ENCOUNTER — Encounter: Payer: Self-pay | Admitting: Gynecology

## 2016-11-02 ENCOUNTER — Other Ambulatory Visit: Payer: Self-pay

## 2016-12-26 ENCOUNTER — Ambulatory Visit (INDEPENDENT_AMBULATORY_CARE_PROVIDER_SITE_OTHER): Payer: BC Managed Care – PPO | Admitting: Family Medicine

## 2016-12-26 ENCOUNTER — Encounter: Payer: Self-pay | Admitting: Family Medicine

## 2016-12-26 ENCOUNTER — Ambulatory Visit (INDEPENDENT_AMBULATORY_CARE_PROVIDER_SITE_OTHER): Payer: BC Managed Care – PPO

## 2016-12-26 VITALS — BP 119/70 | HR 67 | Temp 99.0°F | Resp 16 | Ht 64.5 in | Wt 268.0 lb

## 2016-12-26 DIAGNOSIS — I10 Essential (primary) hypertension: Secondary | ICD-10-CM

## 2016-12-26 DIAGNOSIS — R0600 Dyspnea, unspecified: Secondary | ICD-10-CM

## 2016-12-26 DIAGNOSIS — R0609 Other forms of dyspnea: Secondary | ICD-10-CM

## 2016-12-26 DIAGNOSIS — M7989 Other specified soft tissue disorders: Secondary | ICD-10-CM | POA: Diagnosis not present

## 2016-12-26 DIAGNOSIS — E7439 Other disorders of intestinal carbohydrate absorption: Secondary | ICD-10-CM | POA: Diagnosis not present

## 2016-12-26 DIAGNOSIS — J9801 Acute bronchospasm: Secondary | ICD-10-CM

## 2016-12-26 DIAGNOSIS — J4541 Moderate persistent asthma with (acute) exacerbation: Secondary | ICD-10-CM

## 2016-12-26 LAB — POCT URINALYSIS DIP (MANUAL ENTRY)
BILIRUBIN UA: NEGATIVE
Glucose, UA: NEGATIVE mg/dL
Ketones, POC UA: NEGATIVE mg/dL
Leukocytes, UA: NEGATIVE
Nitrite, UA: NEGATIVE
PH UA: 6 (ref 5.0–8.0)
Protein Ur, POC: NEGATIVE mg/dL
RBC UA: NEGATIVE
Spec Grav, UA: 1.02 (ref 1.010–1.025)
Urobilinogen, UA: 0.2 E.U./dL

## 2016-12-26 LAB — POCT CBC
Granulocyte percent: 74.7 %G (ref 37–80)
HCT, POC: 42.1 % (ref 37.7–47.9)
Hemoglobin: 13.7 g/dL (ref 12.2–16.2)
Lymph, poc: 2.6 (ref 0.6–3.4)
MCH, POC: 24.8 pg — AB (ref 27–31.2)
MCHC: 32.5 g/dL (ref 31.8–35.4)
MCV: 76.2 fL — AB (ref 80–97)
MID (cbc): 0.2 (ref 0–0.9)
MPV: 8.7 fL (ref 0–99.8)
POC GRANULOCYTE: 8.2 — AB (ref 2–6.9)
POC LYMPH PERCENT: 23.7 %L (ref 10–50)
POC MID %: 1.6 %M (ref 0–12)
Platelet Count, POC: 299 10*3/uL (ref 142–424)
RBC: 5.52 M/uL — AB (ref 4.04–5.48)
RDW, POC: 14.7 %
WBC: 11 10*3/uL — AB (ref 4.6–10.2)

## 2016-12-26 LAB — GLUCOSE, POCT (MANUAL RESULT ENTRY): POC GLUCOSE: 90 mg/dL (ref 70–99)

## 2016-12-26 IMAGING — DX DG CHEST 2V
2 series · 2 of 2 positions shown · non-contrast
Comparison: [DATE]

CLINICAL DATA: Wheezing and cough

EXAM:
CHEST  2 VIEW

[chest pa]
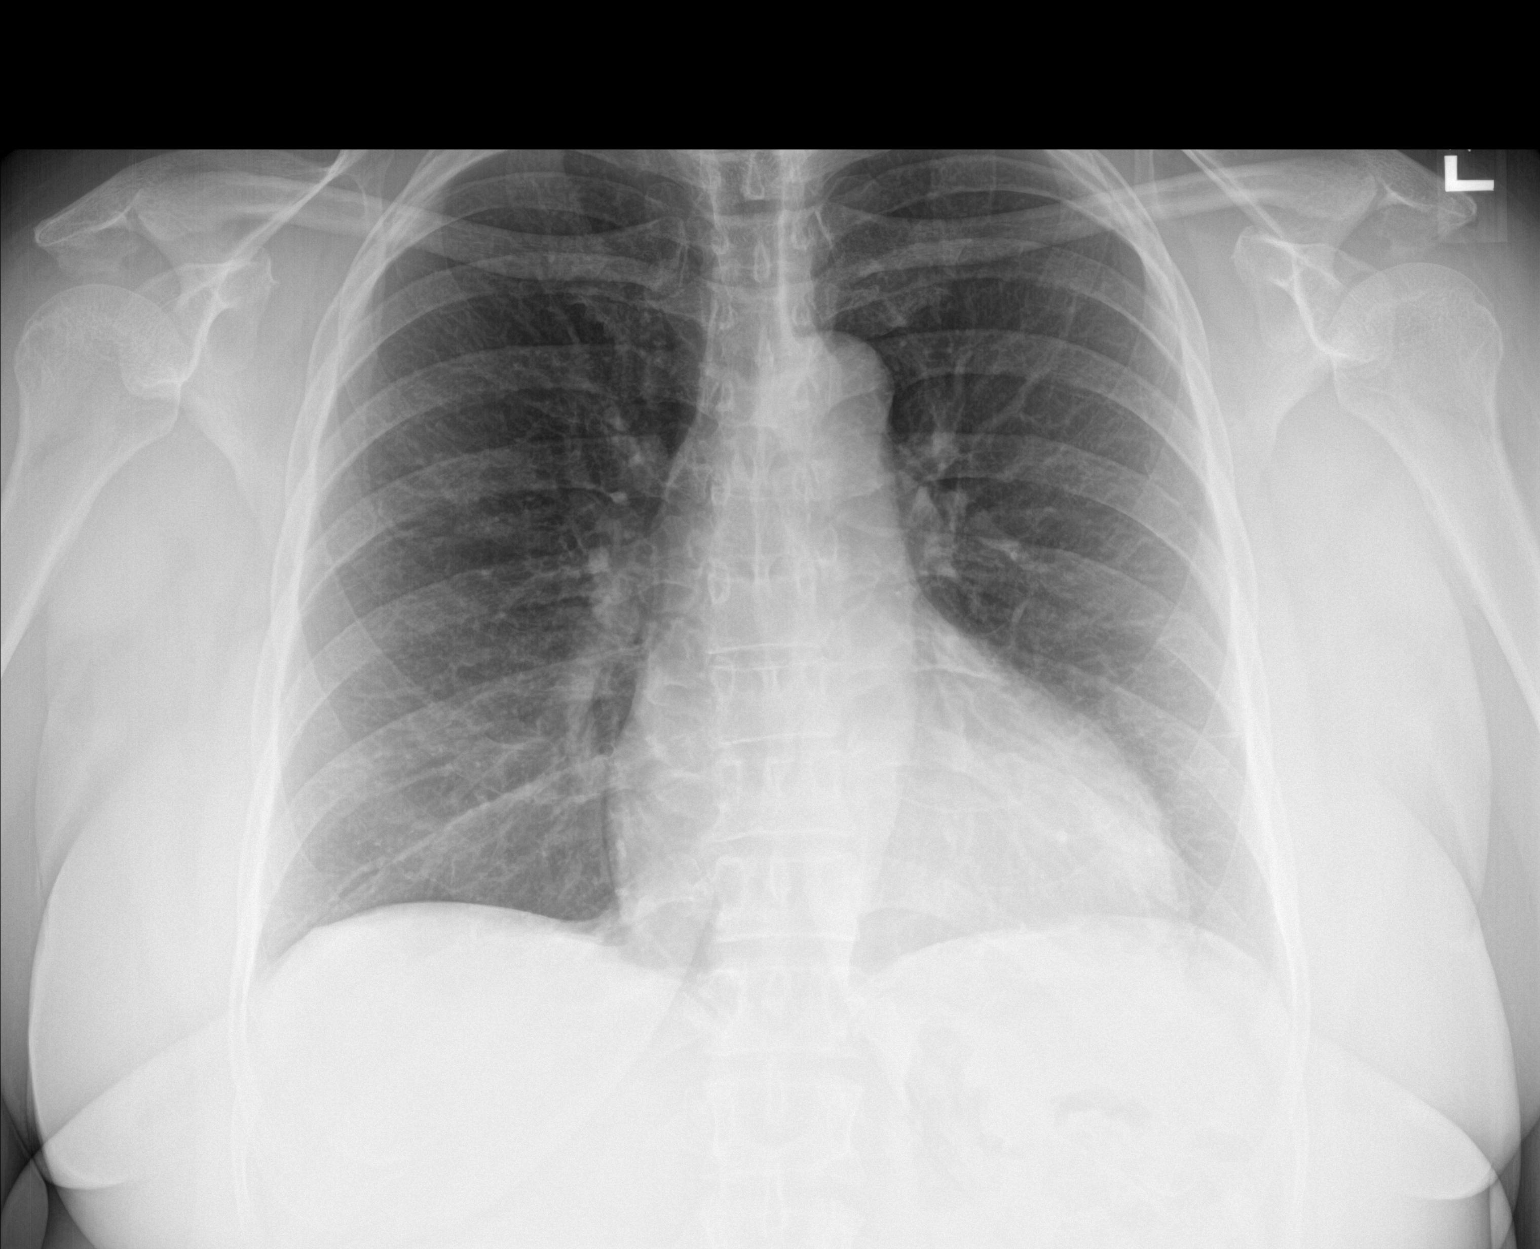

[chest lat]
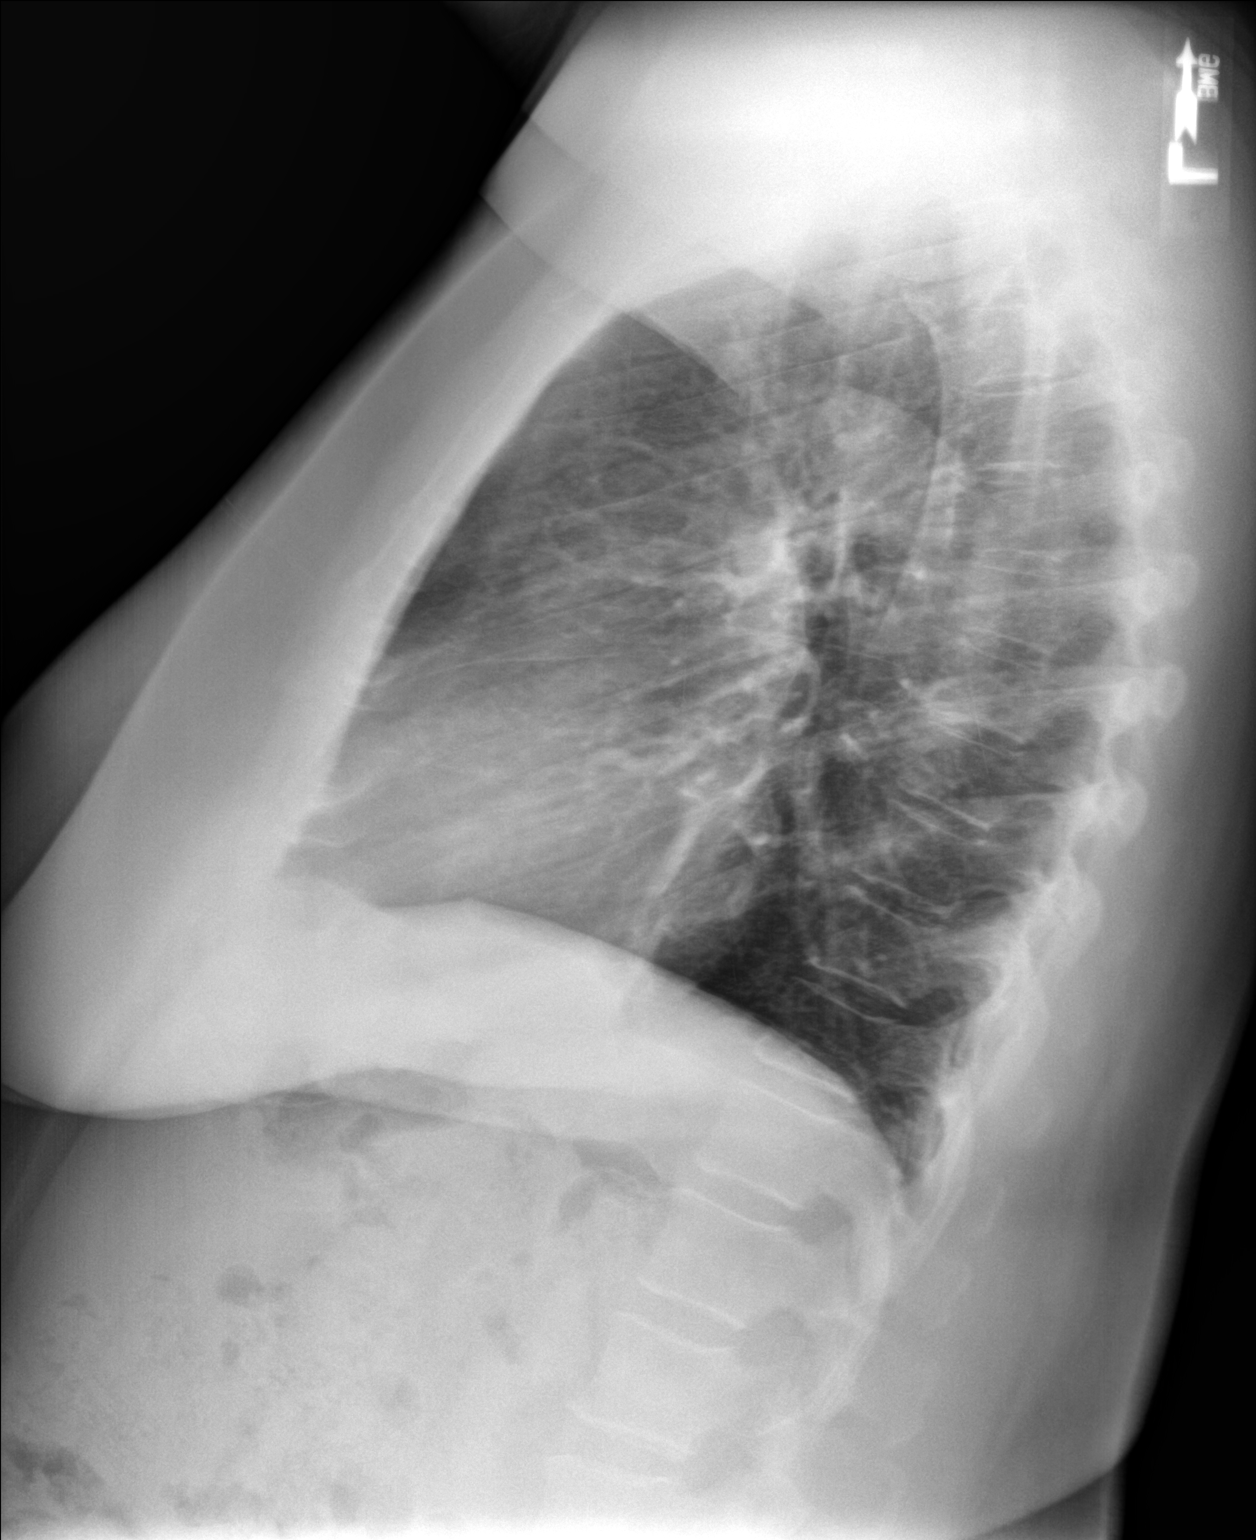

[2 of 2 positions shown; findings below may reference images not displayed]

FINDINGS: The heart size and mediastinal contours are within normal limits.
Both lungs are clear. The visualized skeletal structures are
unremarkable.
IMPRESSION: No active cardiopulmonary disease.

## 2016-12-26 NOTE — Patient Instructions (Addendum)
Start using inhaler 2 puffs three times daily for the next week and then as needed.  Continue Montelukast.    Asthma, Adult Asthma is a condition of the lungs in which the airways tighten and narrow. Asthma can make it hard to breathe. Asthma cannot be cured, but medicine and lifestyle changes can help control it. Asthma may be started (triggered) by:  Animal skin flakes (dander).  Dust.  Cockroaches.  Pollen.  Mold.  Smoke.  Cleaning products.  Hair sprays or aerosol sprays.  Paint fumes or strong smells.  Cold air, weather changes, and winds.  Crying or laughing hard.  Stress.  Certain medicines or drugs.  Foods, such as dried fruit, potato chips, and sparkling grape juice.  Infections or conditions (colds, flu).  Exercise.  Certain medical conditions or diseases.  Exercise or tiring activities.  Follow these instructions at home:  Take medicine as told by your doctor.  Use a peak flow meter as told by your doctor. A peak flow meter is a tool that measures how well the lungs are working.  Record and keep track of the peak flow meter's readings.  Understand and use the asthma action plan. An asthma action plan is a written plan for taking care of your asthma and treating your attacks.  To help prevent asthma attacks: ? Do not smoke. Stay away from secondhand smoke. ? Change your heating and air conditioning filter often. ? Limit your use of fireplaces and wood stoves. ? Get rid of pests (such as roaches and mice) and their droppings. ? Throw away plants if you see mold on them. ? Clean your floors. Dust regularly. Use cleaning products that do not smell. ? Have someone vacuum when you are not home. Use a vacuum cleaner with a HEPA filter if possible. ? Replace carpet with wood, tile, or vinyl flooring. Carpet can trap animal skin flakes and dust. ? Use allergy-proof pillows, mattress covers, and box spring covers. ? Wash bed sheets and blankets every  week in hot water and dry them in a dryer. ? Use blankets that are made of polyester or cotton. ? Clean bathrooms and kitchens with bleach. If possible, have someone repaint the walls in these rooms with mold-resistant paint. Keep out of the rooms that are being cleaned and painted. ? Wash hands often. Contact a doctor if:  You have make a whistling sound when breaking (wheeze), have shortness of breath, or have a cough even if taking medicine to prevent attacks.  The colored mucus you cough up (sputum) is thicker than usual.  The colored mucus you cough up changes from clear or white to yellow, green, gray, or bloody.  You have problems from the medicine you are taking such as: ? A rash. ? Itching. ? Swelling. ? Trouble breathing.  You need reliever medicines more than 2-3 times a week.  Your peak flow measurement is still at 50-79% of your personal best after following the action plan for 1 hour.  You have a fever. Get help right away if:  You seem to be worse and are not responding to medicine during an asthma attack.  You are short of breath even at rest.  You get short of breath when doing very little activity.  You have trouble eating, drinking, or talking.  You have chest pain.  You have a fast heartbeat.  Your lips or fingernails start to turn blue.  You are light-headed, dizzy, or faint.  Your peak flow is less than  50% of your personal best. This information is not intended to replace advice given to you by your health care provider. Make sure you discuss any questions you have with your health care provider. Document Released: 10/21/2007 Document Revised: 10/10/2015 Document Reviewed: 12/01/2012 Elsevier Interactive Patient Education  2017 [employer].    IF you received an x-ray today, you will receive an invoice from [facility]. Please contact [facility] at [phone] with questions or concerns regarding your invoice.   IF you  received labwork today, you will receive an invoice from [facility]. Please contact [facility] at [phone] with questions or concerns regarding your invoice.   Our billing staff will not be able to assist you with questions regarding bills from these companies.  You will be contacted with the lab results as soon as they are available. The fastest way to get your results is to activate your My Chart account. Instructions are located on the last page of this paperwork. If you have not heard from [place] regarding the results in 2 weeks, please contact this office.

## 2016-12-26 NOTE — Progress Notes (Signed)
Subjective:    Patient ID: Bianca Medina, female    DOB: Nov 27, 1950, 66 y.o.   MRN: [id number]  12/26/2016  URI (SOB, coughing, upper back pain, fatige, leg pain x 2week ago )   HPI This 66 y.o. female presents for evaluation of cough with shortness of breath for two weeks.  Back of legs are starting ot hurt; similar to an acute illness.  Has had wheezing.  Went to Dr. Marlou Sa three days ago.  He prescribed Montelukast 10mg  daily.  Has also been seeing Dr. Marlou Sa for leg swelling which has improved yet in ankle B.  Feels a pulling; was so swollen.  All of this was going on at the same time.  Right now, the immediate issue is a cough, nighttime wheezing.  Weight gain in the past few weeks.  Since April, was weighing 245; today is 265.  Worried about medication induced weight gain.  Also had a cyst removed in R posterior neck.  Dermatologist also noticed a spider bite on B legs that had healed.    No fever; gets warm with ambulation; when sits down, sweats improved.  No chills.  +HA  No ear pain or sore throat; ears are itching a lot.  No rhinorrhea; no nasal congestion.  +PND.  No sputum production.  No coughing during the day; coughs in the morning and night.  Mild cough; wheezing at night and morning.  DOE with movement; fine with rest.  No orthopnea.  Allergic to mold; recent weather with a lot of rain.  Taking Xyzal and Singulair for allergies.  Diagnosed with asthma ten years ago; had first asthma attack at that time.  No rescue inhaler currently.  Uses Netti Pott sparingly.  No CAD/CHF.  Leg swelling started in June.    Just received message from [facility] that Dr. Marlou Sa sent in Albuterol HFA this morning.   BP Readings from Last 3 Encounters:  12/26/16 119/70  07/17/16 132/80  05/06/16 128/80   Wt Readings from Last 3 Encounters:  12/26/16 268 lb (121.6 kg)  05/06/16 251 lb (113.9 kg)  05/02/15 249 lb 9.6 oz (113.2 kg)   Immunization History  Administered Date(s) Administered  .  Influenza Whole 04/25/2012  . Pneumococcal Conjugate-13 04/25/2012  . Tdap 02/18/2012    Review of Systems  Constitutional: Negative for chills, diaphoresis, fatigue and fever.  HENT: Positive for postnasal drip. Negative for congestion, ear pain, rhinorrhea, sinus pressure, sore throat and trouble swallowing.   Respiratory: Positive for cough, shortness of breath and wheezing.   Cardiovascular: Positive for leg swelling. Negative for chest pain and palpitations.  Gastrointestinal: Negative for abdominal pain, constipation, diarrhea, nausea and vomiting.  Musculoskeletal: Positive for myalgias.  Neurological: Positive for headaches.    Past Medical History:  Diagnosis Date  . Anemia 2003   IRON DEFICIENCY   . Asthma   . GERD (gastroesophageal reflux disease)   . Hyperlipidemia, mild   . Hypertension   . NSVD (normal spontaneous vaginal delivery)    X3  . Obesity   . PMDD (premenstrual dysphoric disorder)   . Sleep disorder    Past Surgical History:  Procedure Laterality Date  . ABDOMINAL HYSTERECTOMY    . TUBAL LIGATION     Allergies  Allergen Reactions  . Codeine Other (See Comments)    nightmares   Current Outpatient Prescriptions  Medication Sig Dispense Refill  . amoxicillin (AMOXIL) 500 MG capsule     . Biotin 5000 MCG CAPS Take 1 capsule  by mouth daily.    . chlorthalidone (HYGROTON) 25 MG tablet Take 1 tablet by mouth daily.    . Cholecalciferol (VITAMIN D3) 1000 units CAPS Take by mouth.    . Esomeprazole Magnesium (NEXIUM PO) Take by mouth as needed.    Marland Kitchen glucosamine-chondroitin 500-400 MG tablet Take 1 tablet by mouth 3 (three) times daily.    Marland Kitchen levocetirizine (XYZAL) 5 MG tablet Take 5 mg by mouth every evening.    . metFORMIN (GLUCOPHAGE) 500 MG tablet Take by mouth 2 (two) times daily with a meal.    . montelukast (SINGULAIR) 10 MG tablet     . QUEtiapine (SEROQUEL) 25 MG tablet Take 1 tablet by mouth daily.    . meloxicam (MOBIC) 7.5 MG tablet       No current facility-administered medications for this visit.    Social History   Social History  . Marital status: Single    Spouse name: N/A  . Number of children: 3  . Years of education: college   Occupational History  . [employer]   Social History Main Topics  . Smoking status: Never Smoker  . Smokeless tobacco: Never Used  . Alcohol use No  . Drug use: No  . Sexual activity: Yes    Birth control/ protection: Surgical     Comment: HYSTERECTOMY   Other Topics Concern  . Not on file   Social History Narrative  . No narrative on file   Family History  Problem Relation Age of Onset  . Cancer Mother        LUNG- SMOKER  . Diabetes Father   . Hypertension Father   . Heart disease Father   . Cancer Paternal Aunt        COLON  . Cancer Paternal Uncle        COLON       Objective:    BP 119/70   Pulse 67   Temp 99 F (37.2 C) (Oral)   Resp 16   Ht 5' 4.5" (1.638 m)   Wt 268 lb (121.6 kg)   SpO2 93%   BMI 45.29 kg/m  Physical Exam  Constitutional: She is oriented to person, place, and time. She appears well-developed and well-nourished. No distress.  HENT:  Head: Normocephalic and atraumatic.  Right Ear: External ear normal.  Left Ear: External ear normal.  Nose: Nose normal.  Mouth/Throat: Oropharynx is clear and moist.  Eyes: Pupils are equal, round, and reactive to light. Conjunctivae and EOM are normal.  Neck: Normal range of motion. Neck supple. No thyromegaly present.  Cardiovascular: Normal rate, regular rhythm and normal heart sounds.  Exam reveals no gallop and no friction rub.   No murmur heard. Trace to 1+ pitting edema lower legs B.  Pulmonary/Chest: Effort normal and breath sounds normal. She has no wheezes. She has no rales.  Abdominal: Soft. Bowel sounds are normal. She exhibits no distension and no mass. There is no tenderness. There is no rebound and no guarding.  Musculoskeletal:  She exhibits edema.  Lymphadenopathy:    She has no cervical adenopathy.  Neurological: She is alert and oriented to person, place, and time.  Skin: Skin is warm and dry. She is not diaphoretic.  Psychiatric: She has a normal mood and affect. Her behavior is normal.  Nursing note and vitals reviewed.  Results for orders placed or performed in visit on 12/26/16  Comprehensive metabolic panel  Result Value Ref Range  Glucose 94 65 - 99 mg/dL   BUN 17 8 - 27 mg/dL   Creatinine, Ser 0.83 0.57 - 1.00 mg/dL   GFR calc non Af Amer 75 >59 mL/min/1.73   GFR calc Af Amer 86 >59 mL/min/1.73   BUN/Creatinine Ratio 20 12 - 28   Sodium 142 134 - 144 mmol/L   Potassium 4.3 3.5 - 5.2 mmol/L   Chloride 100 96 - 106 mmol/L   CO2 26 20 - 29 mmol/L   Calcium 9.7 8.7 - 10.3 mg/dL   Total Protein 7.4 6.0 - 8.5 g/dL   Albumin 4.2 3.6 - 4.8 g/dL   Globulin, Total 3.2 1.5 - 4.5 g/dL   Albumin/Globulin Ratio 1.3 1.2 - 2.2   Bilirubin Total 0.4 0.0 - 1.2 mg/dL   Alkaline Phosphatase 89 39 - 117 IU/L   AST 16 0 - 40 IU/L   ALT 13 0 - 32 IU/L  Brain natriuretic peptide  Result Value Ref Range   BNP 15.2 0.0 - 100.0 pg/mL  POCT CBC  Result Value Ref Range   WBC 11.0 (A) 4.6 - 10.2 K/uL   Lymph, poc 2.6 0.6 - 3.4   POC LYMPH PERCENT 23.7 10 - 50 %L   MID (cbc) 0.2 0 - 0.9   POC MID % 1.6 0 - 12 %M   POC Granulocyte 8.2 (A) 2 - 6.9   Granulocyte percent 74.7 37 - 80 %G   RBC 5.52 (A) 4.04 - 5.48 M/uL   Hemoglobin 13.7 12.2 - 16.2 g/dL   HCT, POC 42.1 37.7 - 47.9 %   MCV 76.2 (A) 80 - 97 fL   MCH, POC 24.8 (A) 27 - 31.2 pg   MCHC 32.5 31.8 - 35.4 g/dL   RDW, POC 14.7 %   Platelet Count, POC 299 142 - 424 K/uL   MPV 8.7 0 - 99.8 fL  POCT glucose (manual entry)  Result Value Ref Range   POC Glucose 90 70 - 99 mg/dl  POCT urinalysis dipstick  Result Value Ref Range   Color, UA yellow yellow   Clarity, UA hazy (A) clear   Glucose, UA negative negative mg/dL   Bilirubin, UA negative negative    Ketones, POC UA negative negative mg/dL   Spec Grav, UA 1.020 1.010 - 1.025   Blood, UA negative negative   pH, UA 6.0 5.0 - 8.0   Protein Ur, POC negative negative mg/dL   Urobilinogen, UA 0.2 0.2 or 1.0 E.U./dL   Nitrite, UA Negative Negative   Leukocytes, UA Negative Negative   No results found. Depression screen PHQ 2/9 12/26/2016  Decreased Interest 0  Down, Depressed, Hopeless 0  PHQ - 2 Score 0   Fall Risk  12/26/2016  Falls in the past year? No    EKG: sinus bradycardia at 53; no acute changes; no ST changes.    Assessment & Plan:   1. Moderate persistent asthma with exacerbation   2. Bronchospasm   3. Dyspnea on exertion   4. Leg swelling   5. Glucose intolerance   6. Essential hypertension   7. Morbid obesity ([place])    -new onset bronchospasm with DOE and leg swelling in patient with asthma, hypertension,glucose intolerance, and obesity.  EKG and CXR normal.  Benign exam. Treat with Albuterol 2 puffs tid scheduled for one week and then prn.  Continue Singulair recently prescribed by Dr. Marlou Sa.  -obtain BNP to rule out cardiac etology/CHF etiology.  Obtain other labs to rule out secondary  etiology to symptoms.   Orders Placed This Encounter  Procedures  . DG Chest 2 View    Standing Status:   Future    Number of Occurrences:   1    Standing Expiration Date:   12/26/2017    Order Specific Question:   Reason for Exam (SYMPTOM  OR DIAGNOSIS REQUIRED)    Answer:   wheezing, cough, dyspnea    Order Specific Question:   Preferred imaging location?    Answer:   External  . Comprehensive metabolic panel  . Brain natriuretic peptide  . POCT CBC  . POCT glucose (manual entry)  . POCT urinalysis dipstick  . EKG 12-Lead   Meds ordered this encounter  Medications  . amoxicillin (AMOXIL) 500 MG capsule  . montelukast (SINGULAIR) 10 MG tablet  . meloxicam (MOBIC) 7.5 MG tablet  . Cholecalciferol (VITAMIN D3) 1000 units CAPS    Sig: Take by mouth.  . Biotin 5000 MCG  CAPS    Sig: Take 1 capsule by mouth daily.  Marland Kitchen glucosamine-chondroitin 500-400 MG tablet    Sig: Take 1 tablet by mouth 3 (three) times daily.    No Follow-up on file.   Desta Bujak Elayne Guerin, M.D. Primary Care at [facility] previously [facility] [address] [place], [place]  [address] [phone] phone [phone] fax

## 2016-12-27 LAB — COMPREHENSIVE METABOLIC PANEL
A/G RATIO: 1.3 (ref 1.2–2.2)
ALT: 13 IU/L (ref 0–32)
AST: 16 IU/L (ref 0–40)
Albumin: 4.2 g/dL (ref 3.6–4.8)
Alkaline Phosphatase: 89 IU/L (ref 39–117)
BUN/Creatinine Ratio: 20 (ref 12–28)
BUN: 17 mg/dL (ref 8–27)
Bilirubin Total: 0.4 mg/dL (ref 0.0–1.2)
CALCIUM: 9.7 mg/dL (ref 8.7–10.3)
CO2: 26 mmol/L (ref 20–29)
Chloride: 100 mmol/L (ref 96–106)
Creatinine, Ser: 0.83 mg/dL (ref 0.57–1.00)
GFR calc Af Amer: 86 mL/min/{1.73_m2} (ref >59)
GFR calc non Af Amer: 75 mL/min/{1.73_m2} (ref >59)
GLOBULIN, TOTAL: 3.2 g/dL (ref 1.5–4.5)
Glucose: 94 mg/dL (ref 65–99)
POTASSIUM: 4.3 mmol/L (ref 3.5–5.2)
Sodium: 142 mmol/L (ref 134–144)
Total Protein: 7.4 g/dL (ref 6.0–8.5)

## 2016-12-28 LAB — BRAIN NATRIURETIC PEPTIDE: BNP: 15.2 pg/mL (ref 0.0–100.0)

## 2017-02-05 ENCOUNTER — Ambulatory Visit (INDEPENDENT_AMBULATORY_CARE_PROVIDER_SITE_OTHER): Payer: BC Managed Care – PPO | Admitting: Gynecology

## 2017-02-05 ENCOUNTER — Encounter: Payer: Self-pay | Admitting: Gynecology

## 2017-02-05 VITALS — BP 122/78

## 2017-02-05 DIAGNOSIS — N950 Postmenopausal bleeding: Secondary | ICD-10-CM | POA: Diagnosis not present

## 2017-02-05 DIAGNOSIS — R300 Dysuria: Secondary | ICD-10-CM | POA: Diagnosis not present

## 2017-02-05 NOTE — Progress Notes (Signed)
    Bianca Medina 1951/03/25 [id number]        66 y.o.  G3P3 former patient of Dr. Toney Rakes who presents having had an episode of bright red blood from her perineum. She went to the bathroom and had a bowel movement and subsequently noticed bright red blood on her toilet paper and in the bowl. She then wiped herself and thought maybe it was coming vaginally. She's had no pain associated with this. No urinary symptoms such as frequency dysuria or urgency fever or chills. Dr. Toney Rakes noted 07/17/2016 episode of bleeding with wiping although the patient didn't remember this when questioned today. Status post abdominal hysterectomy in the past.   Past medical history,surgical history, problem list, medications, allergies, family history and social history were all reviewed and documented in the EPIC chart.  Directed ROS with pertinent positives and negatives documented in the history of present illness/assessment and plan.  Exam:  Bianca Medina assistant Vitals:   02/05/17 1410  BP: 122/78   General appearance:  NoAbdomen soft nontender without masses guarding rebound Pelvic external BUS vagina with no blood in the vagina. White discharge noted. Entire vagina was swabbed with a proctoscopy swab and no bleeding at all noted. Bimanual exam without masses or tenderness. Rectal exam shows no gross evidence of hemorrhoids or active fissures. A gentle rectal exam was normal with brownish stool noted and no blood noted.   Assessment/Plan:  66 y.o. G3P3 with episode of bright red blood after bowel movement noted with wiping and in toilet bowl. Exam shows no active areas of bleeding. Vagina showed white discharge and highly unlikely that there was a vaginal source of her bleeding given she is status post hysterectomy, normal exam without lesions or other abnormalities and no blood staining noted despite having an episode of bleeding today. Given the proximity to her bowel movement I suspect that this is GI  related and has a small internal fissure. I recommended she follow up with Dr. Collene Mares her gastroenterologist. No urinary symptoms. Her urinalysis did show red cells white cells and bacteria but also appeared contaminated with squamous cells. Given the lack of her symptoms for UTI will culture and await results before treating.  Patient will follow up with Dr. Collene Mares and then we'll go from there. It becomes a recurrent issue then I may then referred to urology. I did recommend a repeat urinalysis in several weeks to make sure there is no evidence of hematuria.   [name] MD, 3:49 PM 02/05/2017

## 2017-02-05 NOTE — Patient Instructions (Signed)
Office will call to arrange an appointment with Dr. Collene Mares your gastroenterologist. If you do not hear from this office within 1-2 weeks call my office.

## 2017-02-06 LAB — URINALYSIS W MICROSCOPIC + REFLEX CULTURE
BILIRUBIN URINE: NEGATIVE
GLUCOSE, UA: NEGATIVE
HYALINE CAST: NONE SEEN /LPF
Ketones, ur: NEGATIVE
LEUKOCYTE ESTERASE: NEGATIVE
Nitrites, Initial: NEGATIVE
PROTEIN: NEGATIVE
Specific Gravity, Urine: 1.015 (ref 1.001–1.03)
pH: 7 (ref 5.0–8.0)

## 2017-02-06 LAB — NO CULTURE INDICATED

## 2017-02-08 ENCOUNTER — Other Ambulatory Visit: Payer: BC Managed Care – PPO

## 2017-02-08 ENCOUNTER — Other Ambulatory Visit: Payer: Self-pay | Admitting: Gynecology

## 2017-02-08 ENCOUNTER — Telehealth: Payer: Self-pay | Admitting: *Deleted

## 2017-02-08 DIAGNOSIS — R3129 Other microscopic hematuria: Secondary | ICD-10-CM

## 2017-02-08 NOTE — Telephone Encounter (Signed)
-----   Message from [name], MD sent at 02/05/2017  3:55 PM EDT ----- Patient needs appointment with Dr. Collene Mares her gastroenterologist reference episode of bright red bleeding questioning source. No evidence of vaginal source as patient status post hysterectomy. Bleeding occurred after bowel movement.

## 2017-02-08 NOTE — Telephone Encounter (Signed)
Was informed to fax notes and they will contact pt to schedule.

## 2017-02-09 LAB — URINALYSIS
Bilirubin Urine: NEGATIVE
Glucose, UA: NEGATIVE
Hgb urine dipstick: NEGATIVE
Ketones, ur: NEGATIVE
LEUKOCYTES UA: NEGATIVE
Nitrite: NEGATIVE
PROTEIN: NEGATIVE
Specific Gravity, Urine: 1.027 (ref 1.001–1.03)
pH: 5.5 (ref 5.0–8.0)

## 2017-02-09 LAB — URINE CULTURE
MICRO NUMBER: 81054091
SPECIMEN QUALITY: ADEQUATE

## 2017-02-15 ENCOUNTER — Other Ambulatory Visit: Payer: Self-pay | Admitting: Cardiology

## 2017-02-15 DIAGNOSIS — R079 Chest pain, unspecified: Secondary | ICD-10-CM

## 2017-02-22 NOTE — Telephone Encounter (Signed)
Pt scheduled on 03/04/17 @ 1:30pm with Dr. Collene Mares

## 2017-02-25 ENCOUNTER — Encounter (HOSPITAL_COMMUNITY)
Admission: RE | Admit: 2017-02-25 | Discharge: 2017-02-25 | Disposition: A | Discharge status: Home / Self Care | Payer: BC Managed Care – PPO | Source: Ambulatory Visit | Attending: Cardiology | Admitting: Cardiology

## 2017-02-25 DIAGNOSIS — R079 Chest pain, unspecified: Secondary | ICD-10-CM | POA: Insufficient documentation

## 2017-02-25 MED ORDER — TECHNETIUM TC 99M TETROFOSMIN IV KIT
30.0000 | PACK | Freq: Once | INTRAVENOUS | Status: AC | PRN
  Administered 2017-02-25: 30 via INTRAVENOUS

## 2017-02-26 ENCOUNTER — Encounter (HOSPITAL_COMMUNITY)
Admission: RE | Admit: 2017-02-26 | Discharge: 2017-02-26 | Disposition: A | Discharge status: Home / Self Care | Payer: BC Managed Care – PPO | Source: Ambulatory Visit | Attending: Cardiology | Admitting: Cardiology

## 2017-02-26 DIAGNOSIS — R079 Chest pain, unspecified: Secondary | ICD-10-CM | POA: Diagnosis not present

## 2017-02-26 MED ORDER — REGADENOSON 0.4 MG/5ML IV SOLN
INTRAVENOUS | Status: AC
Start: 2017-02-26 — End: 2017-02-26
  Administered 2017-02-26: 0.4 mg via INTRAVENOUS
  Filled 2017-02-26: qty 5

## 2017-02-26 MED ORDER — REGADENOSON 0.4 MG/5ML IV SOLN
0.4000 mg | Freq: Once | INTRAVENOUS | Status: AC
  Administered 2017-02-26: 0.4 mg via INTRAVENOUS

## 2017-02-26 MED ORDER — TECHNETIUM TC 99M TETROFOSMIN IV KIT
30.0000 | PACK | Freq: Once | INTRAVENOUS | Status: AC | PRN
  Administered 2017-02-26: 30 via INTRAVENOUS

## 2017-03-30 ENCOUNTER — Other Ambulatory Visit: Payer: Self-pay | Admitting: Obstetrics & Gynecology

## 2017-03-30 DIAGNOSIS — Z1231 Encounter for screening mammogram for malignant neoplasm of breast: Secondary | ICD-10-CM

## 2017-05-06 ENCOUNTER — Other Ambulatory Visit: Payer: Self-pay

## 2017-05-12 ENCOUNTER — Encounter: Payer: BC Managed Care – PPO | Admitting: Obstetrics & Gynecology

## 2017-05-21 ENCOUNTER — Ambulatory Visit
Admission: RE | Admit: 2017-05-21 | Discharge: 2017-05-21 | Disposition: A | Discharge status: Home / Self Care | Payer: BC Managed Care – PPO | Source: Ambulatory Visit | Attending: Obstetrics & Gynecology | Admitting: Obstetrics & Gynecology

## 2017-05-21 DIAGNOSIS — Z1231 Encounter for screening mammogram for malignant neoplasm of breast: Secondary | ICD-10-CM

## 2017-05-21 IMAGING — MG DIGITAL SCREENING BILATERAL MAMMOGRAM WITH CAD
4 series · 4 of 4 positions shown · non-contrast
Comparison: Previous exam(s).

CLINICAL DATA: Screening.

EXAM:
DIGITAL SCREENING BILATERAL MAMMOGRAM WITH CAD

[R MLO]
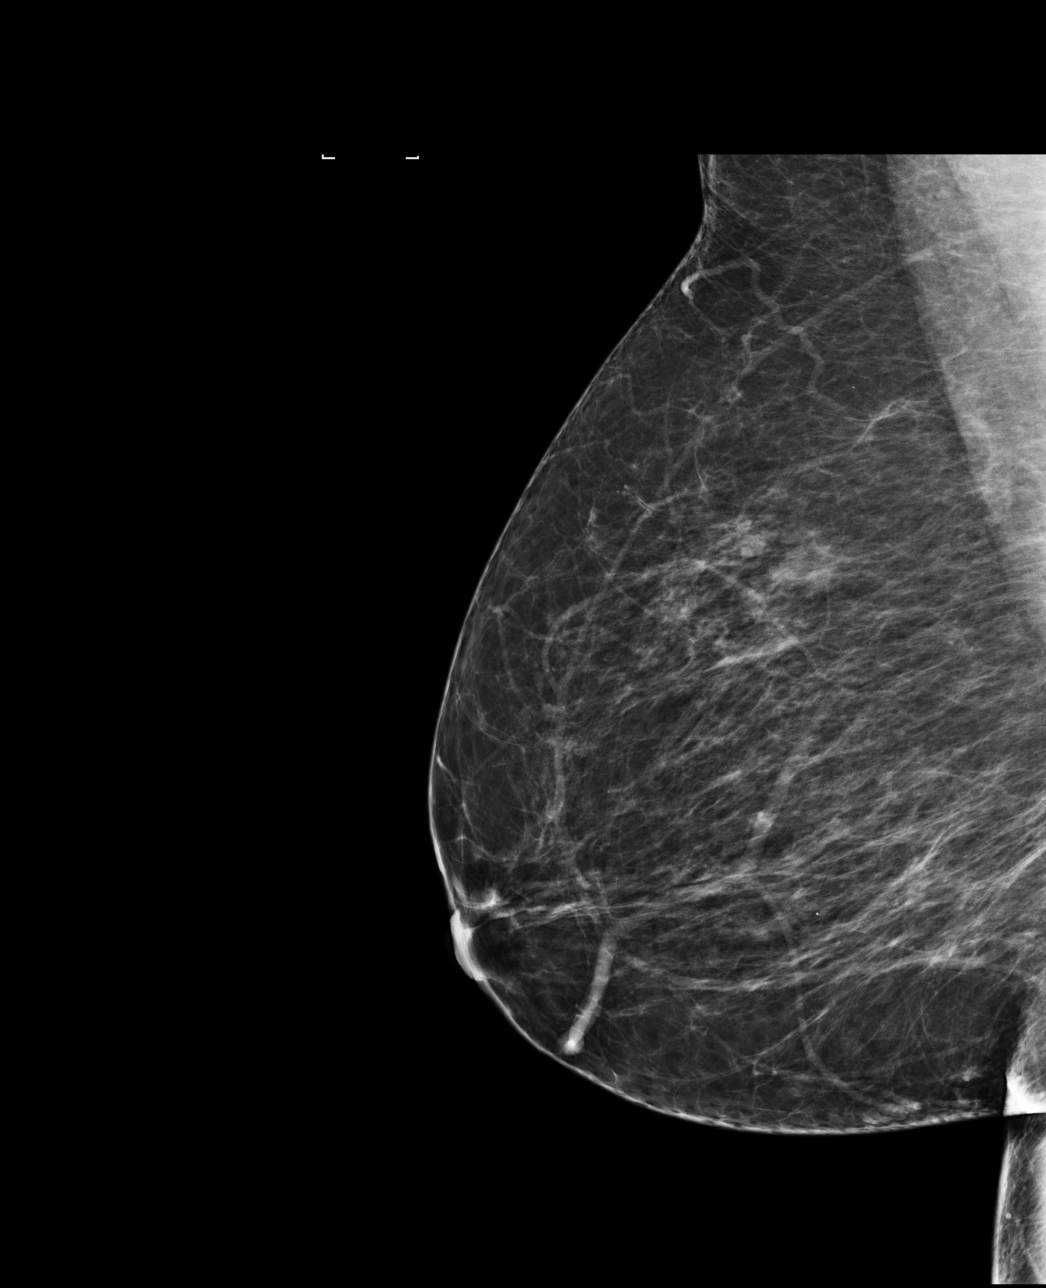

[R CC]
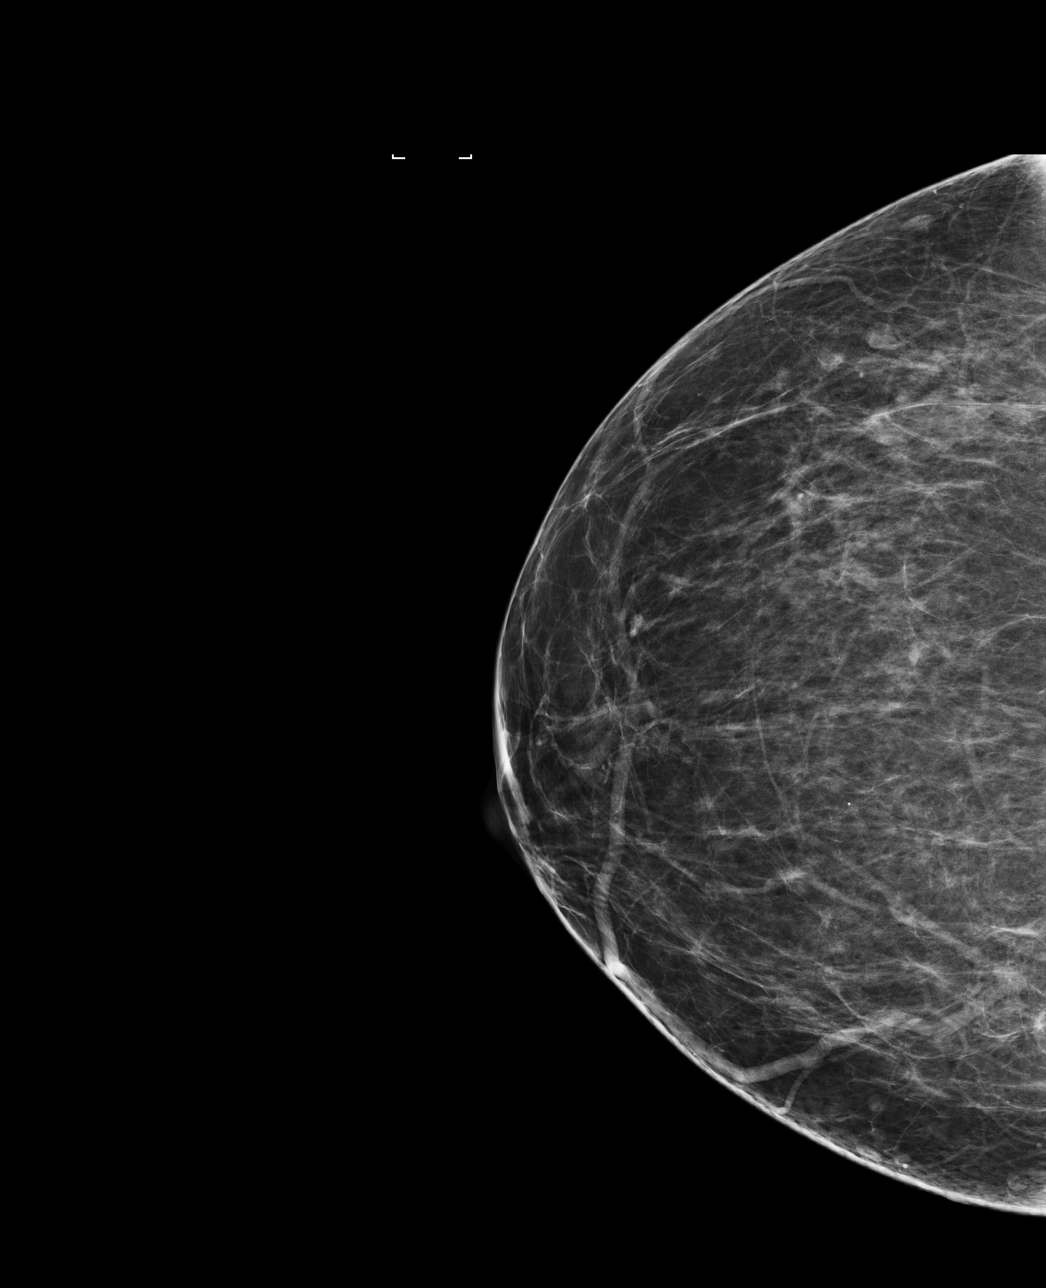

[L MLO]
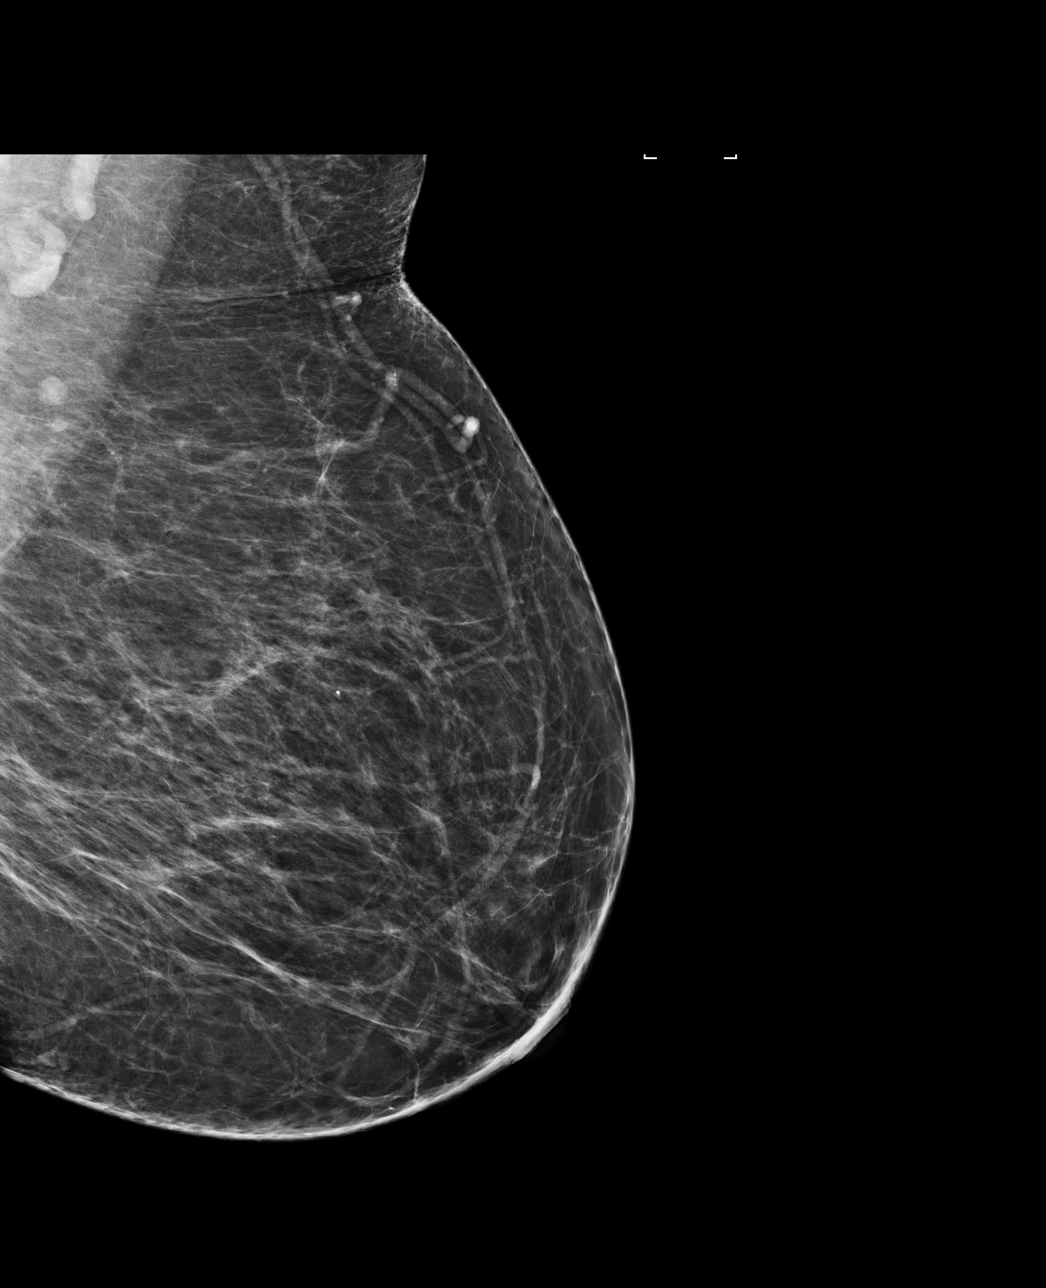

[L CC]
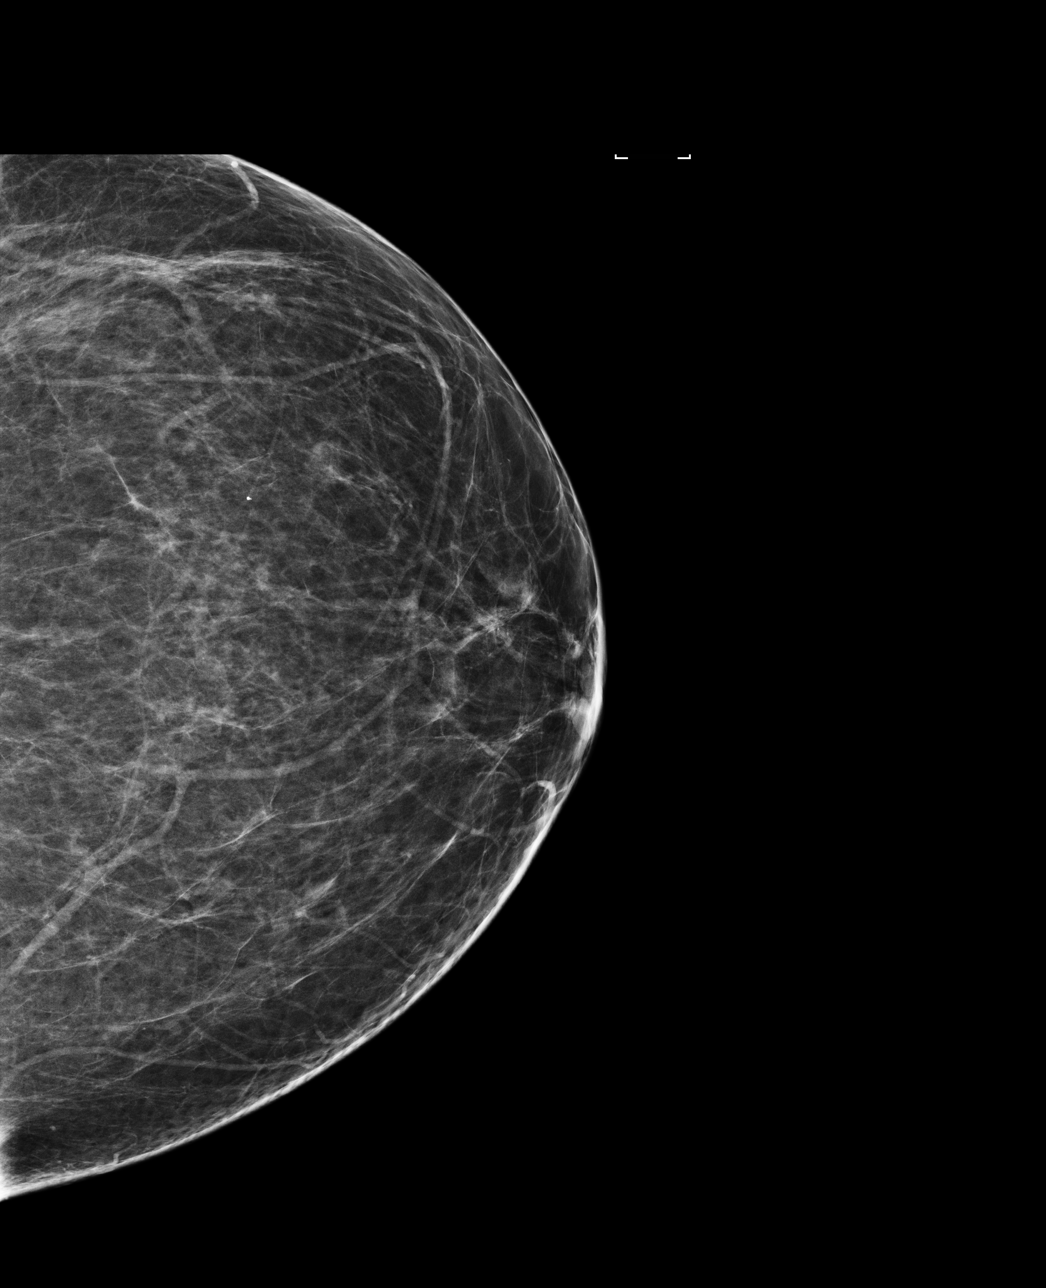

[4 of 4 positions shown; findings below may reference images not displayed]

ACR Breast Density Category b: There are scattered areas of
fibroglandular density.
FINDINGS: There are no findings suspicious for malignancy. Images were
processed with CAD.
IMPRESSION: No mammographic evidence of malignancy. A result letter of this
screening mammogram will be mailed directly to the patient.

RECOMMENDATION:
Screening mammogram in one year. (Code:[US])

BI-RADS CATEGORY  1: Negative.

## 2017-06-17 ENCOUNTER — Encounter: Payer: BC Managed Care – PPO | Admitting: Obstetrics & Gynecology

## 2017-06-30 ENCOUNTER — Encounter: Payer: Self-pay | Admitting: Obstetrics & Gynecology

## 2017-06-30 ENCOUNTER — Ambulatory Visit: Payer: BC Managed Care – PPO | Admitting: Obstetrics & Gynecology

## 2017-06-30 VITALS — BP 128/86 | Ht 64.5 in | Wt 275.0 lb

## 2017-06-30 DIAGNOSIS — Z780 Asymptomatic menopausal state: Secondary | ICD-10-CM

## 2017-06-30 DIAGNOSIS — Z01419 Encounter for gynecological examination (general) (routine) without abnormal findings: Secondary | ICD-10-CM

## 2017-06-30 DIAGNOSIS — Z90722 Acquired absence of ovaries, bilateral: Secondary | ICD-10-CM | POA: Diagnosis not present

## 2017-06-30 DIAGNOSIS — Z01411 Encounter for gynecological examination (general) (routine) with abnormal findings: Secondary | ICD-10-CM

## 2017-06-30 DIAGNOSIS — Z6841 Body Mass Index (BMI) 40.0 and over, adult: Secondary | ICD-10-CM

## 2017-06-30 DIAGNOSIS — Z90710 Acquired absence of both cervix and uterus: Secondary | ICD-10-CM

## 2017-06-30 DIAGNOSIS — Z9079 Acquired absence of other genital organ(s): Secondary | ICD-10-CM | POA: Diagnosis not present

## 2017-06-30 NOTE — Progress Notes (Signed)
ZAKHIA SERES September 06, 1950 [id number]   History:    67 y.o. G3P3L3 Single.  Has 7 grand-children.  RP:  Established patient presenting for annual gyn exam   HPI: S/P Total Hysterectomy/BSO.  Menopause, well on no HRT.  No pelvic pain.  BMI increasing, now 46.47.  Wants to loose weight.  Urine/BMs wnl.  Breasts wnl.  Health labs with Fam MD.  Past medical history,surgical history, family history and social history were all reviewed and documented in the EPIC chart.  Gynecologic History No LMP recorded. Patient has had a hysterectomy. Contraception: status post hysterectomy Last Pap: 07/2016. Results were: Negative Last mammogram: 05/2017. Results were: Negative Bone Density: Normal 05/2015.  Will repeat at 4 yrs in 2020 Colonoscopy: 2012, per patient, had benign polyps.  Will contact Dr Collene Mares to schedule Screening [place]  Obstetric History OB History  Gravida Para Term Preterm AB Living  3 3       3   SAB TAB Ectopic Multiple Live Births               # Outcome Date GA Lbr Len/2nd Weight Sex Delivery Anes PTL Lv  3 Para           2 Para           1 Para                ROS: A ROS was performed and pertinent positives and negatives are included in the history.  GENERAL: No fevers or chills. HEENT: No change in vision, no earache, sore throat or sinus congestion. NECK: No pain or stiffness. CARDIOVASCULAR: No chest pain or pressure. No palpitations. PULMONARY: No shortness of breath, cough or wheeze. GASTROINTESTINAL: No abdominal pain, nausea, vomiting or diarrhea, melena or bright red blood per rectum. GENITOURINARY: No urinary frequency, urgency, hesitancy or dysuria. MUSCULOSKELETAL: No joint or muscle pain, no back pain, no recent trauma. DERMATOLOGIC: No rash, no itching, no lesions. ENDOCRINE: No polyuria, polydipsia, no heat or cold intolerance. No recent change in weight. HEMATOLOGICAL: No anemia or easy bruising or bleeding. NEUROLOGIC: No headache, seizures, numbness, tingling  or weakness. PSYCHIATRIC: No depression, no loss of interest in normal activity or change in sleep pattern.     Exam:   BP 128/86   Ht 5' 4.5" (1.638 m)   Wt 275 lb (124.7 kg)   BMI 46.47 kg/m   Body mass index is 46.47 kg/m.  General appearance : Well developed well nourished female. No acute distress HEENT: Eyes: no retinal hemorrhage or exudates,  Neck supple, trachea midline, no carotid bruits, no thyroidmegaly Lungs: Clear to auscultation, no rhonchi or wheezes, or rib retractions  Heart: Regular rate and rhythm, no murmurs or gallops Breast:Examined in sitting and supine position were symmetrical in appearance, no palpable masses or tenderness,  no skin retraction, no nipple inversion, no nipple discharge, no skin discoloration, no axillary or supraclavicular lymphadenopathy Abdomen: no palpable masses or tenderness, no rebound or guarding Extremities: no edema or skin discoloration or tenderness  Pelvic: Vulva: Normal             Vagina: No gross lesions or discharge  Cervix/Uterus absent   Adnexa  Without masses or tenderness  Anus: Normal   Assessment/Plan:  67 y.o. female for annual exam   1. Encounter for gynecological examination with abnormal finding Normal exam status post total hysterectomy with BSO.  Pap test negative in March 2018.  Breast exam normal.  Recent mammogram January 2019 was  negative.  Health labs with family physician.  2. Status post total hysterectomy and bilateral salpingo-oophorectomy (BSO)  3. Menopause present Well on no hormone replacement therapy.  Vitamin D supplements, calcium rich nutrition and regular weightbearing physical activity recommended.  Last bone density was normal.  Will repeat in 2020.  4. Class 3 severe obesity due to excess calories without serious comorbidity with body mass index (BMI) of 45.0 to 49.9 in adult [facility]) Severe obesity with a body mass index at 46.  Low calorie/low carb diet recommended, for example [employer] or [employer].  Physical activity with aerobic activities 5 times a week and weightlifting every 2 days recommended.  Counseling on above issues more than 50% for 10 minutes.  Princess Bruins MD, 3:10 PM 06/30/2017

## 2017-07-04 ENCOUNTER — Encounter: Payer: Self-pay | Admitting: Obstetrics & Gynecology

## 2017-07-04 NOTE — Patient Instructions (Signed)
1. Encounter for gynecological examination with abnormal finding Normal exam status post total hysterectomy with BSO.  Pap test negative in March 2018.  Breast exam normal.  Recent mammogram January 2019 was negative.  Health labs with family physician.  2. Status post total hysterectomy and bilateral salpingo-oophorectomy (BSO)  3. Menopause present Well on no hormone replacement therapy.  Vitamin D supplements, calcium rich nutrition and regular weightbearing physical activity recommended.  Last bone density was normal.  Will repeat in 2020.  4. Class 3 severe obesity due to excess calories without serious comorbidity with body mass index (BMI) of 45.0 to 49.9 in adult [facility]) Severe obesity with a body mass index at 46.  Low calorie/low carb diet recommended, for example [employer] or [employer].  Physical activity with aerobic activities 5 times a week and weightlifting every 2 days recommended.  Ashari, it was a pleasure meeting you today!     Exercising to Lose Weight Exercising can help you to lose weight. In order to lose weight through exercise, you need to do vigorous-intensity exercise. You can tell that you are exercising with vigorous intensity if you are breathing very hard and fast and cannot hold a conversation while exercising. Moderate-intensity exercise helps to maintain your current weight. You can tell that you are exercising at a moderate level if you have a higher heart rate and faster breathing, but you are still able to hold a conversation. How often should I exercise? Choose an activity that you enjoy and set realistic goals. Your health care provider can help you to make an activity plan that works for you. Exercise regularly as directed by your health care provider. This may include:  Doing resistance training twice each week, such as: ? Push-ups. ? Sit-ups. ? Lifting weights. ? Using resistance bands.  Doing a given intensity of exercise for a given  amount of time. Choose from these options: ? 150 minutes of moderate-intensity exercise every week. ? 75 minutes of vigorous-intensity exercise every week. ? A mix of moderate-intensity and vigorous-intensity exercise every week.  Children, pregnant women, people who are out of shape, people who are overweight, and older adults may need to consult a health care provider for individual recommendations. If you have any sort of medical condition, be sure to consult your health care provider before starting a new exercise program. What are some activities that can help me to lose weight?  Walking at a rate of at least 4.5 miles an hour.  Jogging or running at a rate of 5 miles per hour.  Biking at a rate of at least 10 miles per hour.  Lap swimming.  Roller-skating or in-line skating.  Cross-country skiing.  Vigorous competitive sports, such as football, basketball, and soccer.  Jumping rope.  Aerobic dancing. How can I be more active in my day-to-day activities?  Use the stairs instead of the elevator.  Take a walk during your lunch break.  If you drive, park your car farther away from work or school.  If you take public transportation, get off one stop early and walk the rest of the way.  Make all of your phone calls while standing up and walking around.  Get up, stretch, and walk around every 30 minutes throughout the day. What guidelines should I follow while exercising?  Do not exercise so much that you hurt yourself, feel dizzy, or get very short of breath.  Consult your health care provider prior to starting a new exercise program.  Wear comfortable clothes and shoes with good support.  Drink plenty of water while you exercise to prevent dehydration or heat stroke. Body water is lost during exercise and must be replaced.  Work out until you breathe faster and your heart beats faster. This information is not intended to replace advice given to you by your health  care provider. Make sure you discuss any questions you have with your health care provider. Document Released: 06/06/2010 Document Revised: 10/10/2015 Document Reviewed: 10/05/2013 Elsevier Interactive Patient Education  2018 [place] for [employer] Calories are units of energy. Your body needs a certain amount of calories from food to keep you going throughout the day. When you eat more calories than your body needs, your body stores the extra calories as fat. When you eat fewer calories than your body needs, your body burns fat to get the energy it needs. Calorie counting means keeping track of how many calories you eat and drink each day. Calorie counting can be helpful if you need to lose weight. If you make sure to eat fewer calories than your body needs, you should lose weight. Ask your health care provider what a healthy weight is for you. For calorie counting to work, you will need to eat the right number of calories in a day in order to lose a healthy amount of weight per week. A dietitian can help you determine how many calories you need in a day and will give you suggestions on how to reach your calorie goal.  A healthy amount of weight to lose per week is usually 1-2 lb (0.5-0.9 kg). This usually means that your daily calorie intake should be reduced by 500-750 calories.  Eating 1,200 - 1,500 calories per day can help most women lose weight.  Eating 1,500 - 1,800 calories per day can help most men lose weight.  What is my plan? My goal is to have __________ calories per day. If I have this many calories per day, I should lose around __________ pounds per week. What do I need to know about calorie counting? In order to meet your daily calorie goal, you will need to:  Find out how many calories are in each food you would like to eat. Try to do this before you eat.  Decide how much of the food you plan to eat.  Write down what you ate and how many calories  it had. Doing this is called keeping a food log.  To successfully lose weight, it is important to balance calorie counting with a healthy lifestyle that includes regular activity. Aim for 150 minutes of moderate exercise (such as walking) or 75 minutes of vigorous exercise (such as running) each week. Where do I find calorie information?  The number of calories in a food can be found on a Nutrition Facts label. If a food does not have a Nutrition Facts label, try to look up the calories online or ask your dietitian for help. Remember that calories are listed per serving. If you choose to have more than one serving of a food, you will have to multiply the calories per serving by the amount of servings you plan to eat. For example, the label on a package of bread might say that a serving size is 1 slice and that there are 90 calories in a serving. If you eat 1 slice, you will have eaten 90 calories. If you eat 2 slices, you will have eaten 180 calories. How  do I keep a food log? Immediately after each meal, record the following information in your food log:  What you ate. Don't forget to include toppings, sauces, and other extras on the food.  How much you ate. This can be measured in cups, ounces, or number of items.  How many calories each food and drink had.  The total number of calories in the meal.  Keep your food log near you, such as in a small notebook in your pocket, or use a mobile app or website. Some programs will calculate calories for you and show you how many calories you have left for the day to meet your goal. What are some calorie counting tips?  Use your calories on foods and drinks that will fill you up and not leave you hungry: ? Some examples of foods that fill you up are nuts and nut butters, vegetables, lean proteins, and high-fiber foods like whole grains. High-fiber foods are foods with more than 5 g fiber per serving. ? Drinks such as sodas, specialty coffee drinks,  alcohol, and juices have a lot of calories, yet do not fill you up.  Eat nutritious foods and avoid empty calories. Empty calories are calories you get from foods or beverages that do not have many vitamins or protein, such as candy, sweets, and soda. It is better to have a nutritious high-calorie food (such as an avocado) than a food with few nutrients (such as a bag of chips).  Know how many calories are in the foods you eat most often. This will help you calculate calorie counts faster.  Pay attention to calories in drinks. Low-calorie drinks include water and unsweetened drinks.  Pay attention to nutrition labels for "low fat" or "fat free" foods. These foods sometimes have the same amount of calories or more calories than the full fat versions. They also often have added sugar, starch, or salt, to make up for flavor that was removed with the fat.  Find a way of tracking calories that works for you. Get creative. Try different apps or programs if writing down calories does not work for you. What are some portion control tips?  Know how many calories are in a serving. This will help you know how many servings of a certain food you can have.  Use a measuring cup to measure serving sizes. You could also try weighing out portions on a kitchen scale. With time, you will be able to estimate serving sizes for some foods.  Take some time to put servings of different foods on your favorite plates, bowls, and cups so you know what a serving looks like.  Try not to eat straight from a bag or box. Doing this can lead to overeating. Put the amount you would like to eat in a cup or on a plate to make sure you are eating the right portion.  Use smaller plates, glasses, and bowls to prevent overeating.  Try not to multitask (for example, watch TV or use your computer) while eating. If it is time to eat, sit down at a table and enjoy your food. This will help you to know when you are full. It will also  help you to be aware of what you are eating and how much you are eating. What are tips for following this plan? Reading food labels  Check the calorie count compared to the serving size. The serving size may be smaller than what you are used to eating.  Check the  source of the calories. Make sure the food you are eating is high in vitamins and protein and low in saturated and trans fats. Shopping  Read nutrition labels while you shop. This will help you make healthy decisions before you decide to purchase your food.  Make a grocery list and stick to it. Cooking  Try to cook your favorite foods in a healthier way. For example, try baking instead of frying.  Use low-fat dairy products. Meal planning  Use more fruits and vegetables. Half of your plate should be fruits and vegetables.  Include lean proteins like poultry and fish. How do I count calories when eating out?  Ask for smaller portion sizes.  Consider sharing an entree and sides instead of getting your own entree.  If you get your own entree, eat only half. Ask for a box at the beginning of your meal and put the rest of your entree in it so you are not tempted to eat it.  If calories are listed on the menu, choose the lower calorie options.  Choose dishes that include vegetables, fruits, whole grains, low-fat dairy products, and lean protein.  Choose items that are boiled, broiled, grilled, or steamed. Stay away from items that are buttered, battered, fried, or served with cream sauce. Items labeled "crispy" are usually fried, unless stated otherwise.  Choose water, low-fat milk, unsweetened iced tea, or other drinks without added sugar. If you want an alcoholic beverage, choose a lower calorie option such as a glass of wine or light beer.  Ask for dressings, sauces, and syrups on the side. These are usually high in calories, so you should limit the amount you eat.  If you want a salad, choose a garden salad and ask for  grilled meats. Avoid extra toppings like bacon, cheese, or fried items. Ask for the dressing on the side, or ask for olive oil and vinegar or lemon to use as dressing.  Estimate how many servings of a food you are given. For example, a serving of cooked rice is  cup or about the size of half a baseball. Knowing serving sizes will help you be aware of how much food you are eating at restaurants. The list below tells you how big or small some common portion sizes are based on everyday objects: ? 1 oz-4 stacked dice. ? 3 oz-1 deck of cards. ? 1 tsp-1 die. ? 1 Tbsp- a ping-pong ball. ? 2 Tbsp-1 ping-pong ball. ?  cup- baseball. ? 1 cup-1 baseball. Summary  Calorie counting means keeping track of how many calories you eat and drink each day. If you eat fewer calories than your body needs, you should lose weight.  A healthy amount of weight to lose per week is usually 1-2 lb (0.5-0.9 kg). This usually means reducing your daily calorie intake by 500-750 calories.  The number of calories in a food can be found on a Nutrition Facts label. If a food does not have a Nutrition Facts label, try to look up the calories online or ask your dietitian for help.  Use your calories on foods and drinks that will fill you up, and not on foods and drinks that will leave you hungry.  Use smaller plates, glasses, and bowls to prevent overeating. This information is not intended to replace advice given to you by your health care provider. Make sure you discuss any questions you have with your health care provider. Document Released: 05/04/2005 Document Revised: 04/03/2016 Document Reviewed: 04/03/2016 Elsevier Interactive  Patient Education  [employer].

## 2017-08-12 DIAGNOSIS — H903 Sensorineural hearing loss, bilateral: Secondary | ICD-10-CM | POA: Insufficient documentation

## 2017-08-12 DIAGNOSIS — H9313 Tinnitus, bilateral: Secondary | ICD-10-CM | POA: Insufficient documentation

## 2017-10-12 ENCOUNTER — Encounter: Payer: Self-pay | Admitting: Family Medicine

## 2017-10-21 ENCOUNTER — Ambulatory Visit (INDEPENDENT_AMBULATORY_CARE_PROVIDER_SITE_OTHER): Payer: BC Managed Care – PPO

## 2017-10-21 ENCOUNTER — Ambulatory Visit: Payer: BC Managed Care – PPO | Admitting: Podiatry

## 2017-10-21 ENCOUNTER — Encounter: Payer: Self-pay | Admitting: Podiatry

## 2017-10-21 ENCOUNTER — Other Ambulatory Visit: Payer: Self-pay

## 2017-10-21 VITALS — BP 114/57 | HR 79

## 2017-10-21 DIAGNOSIS — M25572 Pain in left ankle and joints of left foot: Secondary | ICD-10-CM

## 2017-10-21 DIAGNOSIS — M25571 Pain in right ankle and joints of right foot: Secondary | ICD-10-CM | POA: Diagnosis not present

## 2017-10-21 DIAGNOSIS — R600 Localized edema: Secondary | ICD-10-CM

## 2017-10-21 NOTE — Progress Notes (Signed)
Subjective:   Patient ID: Bianca Medina, female   DOB: 67 y.o.   MRN: [id number]   HPI Patient presents stating she is had some swelling in her ankles bilateral with mild to moderate discomfort but is more concerned about the swelling.  Has had checked by cardiologist and no indications of systemic vascular issues   Review of Systems  All other systems reviewed and are negative.       Objective:  Physical Exam  Constitutional: She appears well-developed and well-nourished.  Cardiovascular: Intact distal pulses.  Pulmonary/Chest: Effort normal.  Musculoskeletal: Normal range of motion.  Neurological: She is alert.  Skin: Skin is warm.  Nursing note and vitals reviewed.   Neurovascular status was found to be intact negative Homans sign was noted bilateral with patient having mild range of motion loss of the ankle bilateral.  Patient does have discomfort lateral side of the feet bilateral with +1 pitting edema but no other indications of significant pathology with mild discomfort noted and patient was noted to have good digital perfusion well oriented x3     Assessment:  Possibility this is low-grade venous or lymphatic disease versus systemic condition which so far does not appear to be present but I did educate her on signs of systemic cardiovascular condition     Plan:  H&P conditions reviewed and recommended compression elevation of the segment and I did dispense ankle stockings bilateral and reviewed venous consult if symptoms persist  X-rays indicate there is mild arthritis but no indications of advanced arthritis stress fracture or symptoms associated with arch

## 2017-11-23 ENCOUNTER — Encounter: Payer: Self-pay | Admitting: Podiatry

## 2017-11-23 ENCOUNTER — Ambulatory Visit: Payer: BC Managed Care – PPO | Admitting: Podiatry

## 2017-11-23 DIAGNOSIS — M25572 Pain in left ankle and joints of left foot: Secondary | ICD-10-CM

## 2017-11-23 DIAGNOSIS — M25571 Pain in right ankle and joints of right foot: Secondary | ICD-10-CM

## 2017-11-23 DIAGNOSIS — R600 Localized edema: Secondary | ICD-10-CM | POA: Diagnosis not present

## 2017-11-24 NOTE — Progress Notes (Signed)
Subjective:   Patient ID: Bianca Medina, female   DOB: 67 y.o.   MRN: [id number]   HPI Patient presents stating that I had a lot of pain Sunday night and I was worried I might of broken something and it extended into my thigh and it seems somewhat better now but I still do not understand what happened   ROS      Objective:  Physical Exam  Neurovascular status unchanged with patient's left foot showing mild to moderate edema that is localized with no proximal erythema drainage or other pathology noted     Assessment:  Probability for an acute inflammatory reaction which seems to have settled down currently     Plan:  Reviewed condition and recommended that patient monitor this and we did look and I did not note any bite or indications of a foreign body.  Patient will be seen back if symptoms persist or occur again  X-rays indicate there is no signs of fracture or advanced arthritic condition which is occurred

## 2017-12-03 ENCOUNTER — Telehealth: Payer: Self-pay | Admitting: Podiatry

## 2017-12-03 NOTE — Telephone Encounter (Signed)
I came in to be seen about pain that I noticed up my left thigh and left foot/now I have a bruise up my upper thigh.

## 2017-12-03 NOTE — Telephone Encounter (Signed)
Left message informing pt that if she had a bumping injury to the thigh that would leave a bruise, but if she had increased swelling, hardness, redness, pain and or chest pain she needed to go to the ED. I told pt she should contact her PCP for the bruise on the thigh especially if there was a knot, and we would take care of the ankle and foot.

## 2018-01-10 ENCOUNTER — Encounter: Payer: BC Managed Care – PPO | Attending: Internal Medicine | Admitting: Registered"

## 2018-01-10 ENCOUNTER — Encounter: Payer: Self-pay | Admitting: Registered"

## 2018-01-10 ENCOUNTER — Encounter: Payer: Self-pay | Admitting: Obstetrics & Gynecology

## 2018-01-10 ENCOUNTER — Ambulatory Visit: Payer: BC Managed Care – PPO | Admitting: Obstetrics & Gynecology

## 2018-01-10 VITALS — BP 124/78 | Ht 64.5 in | Wt 281.2 lb

## 2018-01-10 DIAGNOSIS — E669 Obesity, unspecified: Secondary | ICD-10-CM | POA: Insufficient documentation

## 2018-01-10 DIAGNOSIS — E119 Type 2 diabetes mellitus without complications: Secondary | ICD-10-CM | POA: Insufficient documentation

## 2018-01-10 DIAGNOSIS — Z713 Dietary counseling and surveillance: Secondary | ICD-10-CM | POA: Insufficient documentation

## 2018-01-10 DIAGNOSIS — Z6841 Body Mass Index (BMI) 40.0 and over, adult: Secondary | ICD-10-CM

## 2018-01-10 MED ORDER — PHENTERMINE HCL 37.5 MG PO CAPS: 37.5000 mg | ORAL_CAPSULE | ORAL | 0 refills | Status: DC

## 2018-01-10 NOTE — Patient Instructions (Addendum)
Instructions/Goals:   Recommend having 2-3 carbohydrate choices per meal (30-45 g carbohydrate per meal) and can have a snack in between meals, especially if meals are spaced more than 5 hours apart. For snacks, recommend including some protein and around 1 carbohydrate choice (See handout).    For physical activity-starting goal: include at least 30 minutes of physical activity 2-3 days per week.

## 2018-01-10 NOTE — Progress Notes (Signed)
    Bianca Medina 1950/12/17 [id number]        67 y.o.  G3P3L3 Single.  Has 7 grand-children  RP: Weight loss management  HPI: Obesity with a weight of 281 Lbs/BMI at 47.52 today.  At Annual/Gyn exam with me 06/2017 she was at 275 Lbs/BMI 46.47.  She gained 6 Lbs in 6 months.  Has a [employer], but stopped going.  Eats out in restaurants frequently.  Per patient, has too much appetite and eats too many snacks.  Referral at the [facility] planned today.  cHTN controled on Losartan.  Type 2 DM on Metformin.   OB History  Gravida Para Term Preterm AB Living  3 3       3   SAB TAB Ectopic Multiple Live Births               # Outcome Date GA Lbr Len/2nd Weight Sex Delivery Anes PTL Lv  3 Para           2 Para           1 Para             Past medical history,surgical history, problem list, medications, allergies, family history and social history were all reviewed and documented in the EPIC chart.   Directed ROS with pertinent positives and negatives documented in the history of present illness/assessment and plan.  Exam:  Vitals:   01/10/18 0937  BP: 124/78  Weight: 281 lb 3.2 oz (127.6 kg)  Height: 5' 4.5" (1.638 m)   General appearance:  Normal    Assessment/Plan:  67 y.o. G3P3   1. Class 3 severe obesity due to excess calories with serious comorbidity and body mass index (BMI) of 45.0 to 49.9 in adult [name]) Referred to [facility] by her Family physician, will have her first visit there today.  BP controled on medication.  Decision to try Phentermine 37.5 mg caps daily x 1 month.  Recommend a low calorie/low carb diet such as [employer].  Will also receive the education and recommendations at the [facility] nutrition [facility] weight loss center.  Recommend increase physical activity with aerobic activities such as dancing and walking 5 times a week and weightlifting every 2 days.  Follow-up in 1 month to reevaluate and decide on  further management.  Other orders - phentermine 37.5 MG capsule; Take 1 capsule (37.5 mg total) by mouth every morning.  Counseling on above issues and coordination of care more than 50% for 25 minutes.  Princess Bruins MD, 9:52 AM 01/10/2018

## 2018-01-10 NOTE — Progress Notes (Signed)
Diabetes Self-Management Education  Visit Type: First/Initial  Appt. Start Time: 0807 Appt. End Time: 0907  01/10/2018  [name], identified by name and date of birth, is a 67 y.o. female with a diagnosis of Diabetes: Type 2.   ASSESSMENT  Weight 281 lb (127.5 kg). Body mass index is 47.49 kg/m.   Pt reports that she would like help with losing weight and promoting overall health. Pt reports that she is not sure when she was diagnosed with diabetes. Pt reports that diabetes runs in her family. Pt had another appointment this morning which required she leave before the end of the full appointment time. Pt requested to start with nutrition information today and finish up the remainder of the DSME information at next appointment in 2 weeks. Pt reports that she is not currently checking her blood sugar at home and she does not have a glucometer. Pt reports having some foot pain. Pt is not currently checking her feet regularly. Dietitian recommended pt discuss foot pain concerns with her doctor.   Diabetes Self-Management Education - 01/10/18 0817      Visit Information   Visit Type  First/Initial      Initial Visit   Diabetes Type  Type 2    Are you currently following a meal plan?  No    Are you taking your medications as prescribed?  Yes    Date Diagnosed  --   Pt unsure      Health Coping   How would you rate your overall health?  Fair      Psychosocial Assessment   Patient Belief/Attitude about Diabetes  Motivated to manage diabetes    Self-care barriers  None    Self-management support  Doctor's office    Other persons present  Patient    Patient Concerns  Weight Control;Healthy Lifestyle    Special Needs  Unable to determine    Preferred Learning Style  No preference indicated    Learning Readiness  Ready    How often do you need to have someone help you when you read instructions, pamphlets, or other written materials from your doctor or pharmacy?  1 - Never    What is the last grade level you completed in school?  Masters Degree      Pre-Education Assessment   Patient understands the diabetes disease and treatment process.  Needs Instruction    Patient understands incorporating nutritional management into lifestyle.  Needs Instruction    Patient undertands incorporating physical activity into lifestyle.  Needs Instruction    Patient understands using medications safely.  Demonstrates understanding / competency    Patient understands monitoring blood glucose, interpreting and using results  Needs Instruction    Patient understands prevention, detection, and treatment of acute complications.  Needs Instruction    Patient understands prevention, detection, and treatment of chronic complications.  Needs Instruction    Patient understands how to develop strategies to address psychosocial issues.  Needs Instruction    Patient understands how to develop strategies to promote health/change behavior.  Needs Instruction      Complications   Last HgB A1C per patient/outside source  7.2 %    How often do you check your blood sugar?  0 times/day (not testing)    Fasting Blood glucose range (mg/dL)  --   Pt does not check blood sugar at home.    Postprandial Blood glucose range (mg/dL)  --   Pt does not check blood sugar at home.  Number of hypoglycemic episodes per month  0    Number of hyperglycemic episodes per week  0   Pt denies having symptoms of hyperglycemia but pt has not been checking blood sugar at home.    Have you had a dilated eye exam in the past 12 months?  Yes    Have you had a dental exam in the past 12 months?  Yes    Are you checking your feet?  No      Dietary Intake   Breakfast  (7:30 AM) Piece of sausage OR 2-3 pieces of bacon, slice of wheat bread, no beverage     Lunch  (12:30 PM) Usually eats out for lunch-hamburger and fries OR 2 pieces of fried chicken with a side of fries or a salad, water OR soda    Dinner  (6:30 PM) Usually  eats out-salad or fries with fried chicken OR a cheeseburger, water OR soda    Snack (evening)  might have a peanut butter and jelly sandwich on wheat bread or fruit    Beverage(s)  ~8-16 oz water; ~16 oz soda per day      Exercise   Exercise Type  ADL's   Pt reports she recently got a gym membership and plans to start going 2-3 times per week.   How many days per week to you exercise?  0    How many minutes per day do you exercise?  0    Total minutes per week of exercise  0      Patient Education   Previous Diabetes Education  No    Disease state   Definition of diabetes, type 1 and 2, and the diagnosis of diabetes;Factors that contribute to the development of diabetes    Nutrition management   Role of diet in the treatment of diabetes and the relationship between the three main macronutrients and blood glucose level;Carbohydrate counting    Physical activity and exercise   Role of exercise on diabetes management, blood pressure control and cardiac health.      Individualized Goals (developed by patient)   Nutrition  Follow meal plan discussed;General guidelines for healthy choices and portions discussed    Physical Activity  Exercise 1-2 times per week;Exercise 3-5 times per week      Post-Education Assessment   Patient understands the diabetes disease and treatment process.  Demonstrates understanding / competency    Patient understands incorporating nutritional management into lifestyle.  Demonstrates understanding / competency    Patient undertands incorporating physical activity into lifestyle.  Demonstrates understanding / competency    Patient understands using medications safely.  Demonstrates understanding / competency    Patient understands monitoring blood glucose, interpreting and using results  Needs Instruction    Patient understands prevention, detection, and treatment of acute complications.  Needs Instruction    Patient understands prevention, detection, and treatment  of chronic complications.  Needs Instruction    Patient understands how to develop strategies to address psychosocial issues.  Needs Instruction    Patient understands how to develop strategies to promote health/change behavior.  Needs Instruction      Outcomes   Expected Outcomes  Demonstrated interest in learning. Expect positive outcomes    Future DMSE  2 wks    Program Status  Not Completed   Pt had to leave appointment early due to another appointment. Will complete the rest of the DSME at next appointment in 2 weeks.      Individualized Plan for Diabetes  Self-Management Training:   Learning Objective:  Patient will have a greater understanding of diabetes self-management. Patient education plan is to attend individual and/or group sessions per assessed needs and concerns.   Plan:   Patient Instructions  Instructions/Goals:   Recommend having 2-3 carbohydrate choices per meal (30-45 g carbohydrate per meal) and can have a snack in between meals, especially if meals are spaced more than 5 hours apart. For snacks recommend including some protein and around 1 carbohydrate choice (See handout).    For physical activity-starting goal: include at least 30 minutes of activity 2-3 days per week.     Expected Outcomes:  Demonstrated interest in learning. Expect positive outcomes  Education material provided: My Plate, Snack sheet and Carbohydrate counting sheet, Living Well with Diabetes  If problems or questions, patient to contact team via:  Phone and My Chart   Future DSME appointment: 2 wks

## 2018-01-10 NOTE — Patient Instructions (Signed)
1. Class 3 severe obesity due to excess calories with serious comorbidity and body mass index (BMI) of 45.0 to 49.9 in adult [name]) Referred to [facility] by her Family physician, will have her first visit there today.  BP controled on medication.  Decision to try Phentermine 37.5 mg caps daily x 1 month.  Recommend a low calorie/low carb diet such as [employer].  Will also receive the education and recommendations at the [facility] nutrition [facility] weight loss center.  Recommend increase physical activity with aerobic activities such as dancing and walking 5 times a week and weightlifting every 2 days.  Follow-up in 1 month to reevaluate and decide on further management.  Other orders - phentermine 37.5 MG capsule; Take 1 capsule (37.5 mg total) by mouth every morning.  Bianca Medina, it was a pleasure seeing you today!

## 2018-01-13 ENCOUNTER — Telehealth: Payer: Self-pay | Admitting: *Deleted

## 2018-01-13 NOTE — Telephone Encounter (Signed)
Medication approved effective 01/13/18-04/15/18

## 2018-01-13 NOTE — Telephone Encounter (Signed)
Prior authorization done via cover my meds for phentermine 37.5 mg, will wait for response.

## 2018-01-19 ENCOUNTER — Ambulatory Visit: Payer: BC Managed Care – PPO | Admitting: Obstetrics & Gynecology

## 2018-01-27 ENCOUNTER — Encounter: Payer: BC Managed Care – PPO | Attending: Internal Medicine | Admitting: Registered"

## 2018-01-27 ENCOUNTER — Encounter: Payer: Self-pay | Admitting: Registered"

## 2018-01-27 DIAGNOSIS — Z713 Dietary counseling and surveillance: Secondary | ICD-10-CM | POA: Diagnosis present

## 2018-01-27 DIAGNOSIS — E669 Obesity, unspecified: Secondary | ICD-10-CM | POA: Insufficient documentation

## 2018-01-27 DIAGNOSIS — E119 Type 2 diabetes mellitus without complications: Secondary | ICD-10-CM

## 2018-01-27 DIAGNOSIS — Z6841 Body Mass Index (BMI) 40.0 and over, adult: Secondary | ICD-10-CM | POA: Diagnosis not present

## 2018-01-27 NOTE — Patient Instructions (Addendum)
Instructions/Goals:  -Work to include 3 balanced meals per day. Recommend including 2-3 carbohydrate choices (see yellow card) (30-45 grams of carbohydrates per meal) per meal to supply adequate energy while helping to balance blood sugar.   -Recommend including at least 64 oz water daily.   -Continue working to include regular physical activity.   Starting goal of at least 30 minutes 2 days per week.   -Recommend asking your doctor about checking blood sugar at home.

## 2018-01-27 NOTE — Progress Notes (Signed)
Diabetes Self-Management Education  Visit Type: Follow-up  Appt. Start Time: 0830 Appt. End Time: 0900  01/31/2018  [name], identified by name and date of birth, is a 67 y.o. female with a diagnosis of Diabetes: Type 2.   ASSESSMENT  Weight 275 lb 14.4 oz (125.1 kg). Body mass index is 46.63 kg/m.   Pt reports she was doing well after first visit, but has had a hard time keeping it up. Reports she tried cutting out a lot of carbohydrates. Pt reports she has been trying to eat 3 meals per day. Reports she has cut down her breakfast and increased her water intake. Pt reports she has been eating while stressed. Reports she did have reduced energy when she started making dietary changes, but this has improved lately. Pt reports she was avoiding most carbohydrates apart from fruit when she first started making dietary changes. Pt reports she still has not been checking blood sugar at home. Pt does not have a glucometer. When asked about talking with her doctor regarding whether she should start checking at home to gain more information about how her food and activity is affecting her blood sugar, pt responded that she is not sure about checking blood sugar.   Diabetes Self-Management Education - 01/27/18 0830      Visit Information   Visit Type  Follow-up      Initial Visit   Diabetes Type  Type 2      Dietary Intake   Breakfast  (7 AM) 1 piece sausage, 3 pancake sticks, regular syrup,    Snack (morning)  None reported.     Lunch  (3 PM) Fried chicken breast, cup of slaw,    Snack (afternoon)  (4 PM) 2 chocolate chip packaged cookies    Dinner  (7:30 PM) [place] sandwich and salad, ranch dressing, water    Snack (evening)  None reported.     Beverage(s)  ~36 oz water; 20 oz Gingerale      Exercise   Exercise Type  ADL's    How many days per week to you exercise?  0    How many minutes per day do you exercise?  0    Total minutes per week of exercise  0      Subsequent  Visit   Since your last visit have you continued or begun to take your medications as prescribed?  Yes    Since your last visit have you had your blood pressure checked?  No   No    Since your last visit have you experienced any weight changes?  --   Yes. ~5 lb weight loss.    Since your last visit, are you checking your blood glucose at least once a day?  No      Estimated energy needs: ~1200-1600 calories 135-180 g carbohydrates 75-100 g protein 40-53 g fat  Individualized Plan for Diabetes Self-Management Training:   Learning Objective:  Patient will have a greater understanding of diabetes self-management. Patient education plan is to attend individual and/or group sessions per assessed needs and concerns.  Progress Towards Goal(s):  In progress.   Nutritional Diagnosis:  NB-1.1 Food and nutrition-related knowledge deficit As related to Carbohydrate counting and relationship between blood sugar management and dietary intake.  As evidenced by pt has questions about what a carbohydrate choices and is unsure how much carbohydrate she should eat per meal.  Intervention:  Dietitian discussed importance of having a consistent amount of carbohydrates at each meal  and how not including adequate amount of carbohydrates can lead to feeling tired and wanting to snack later on as pt reports she as feeling very tired and wanting to snack more when she was eating very few carbohydrates. Dietitian provided additional education regarding carbohydrate counting as pt voiced that she still was unsure how to count carbohydrates/how much she should include at each meal. Discussed balanced meals containing 2-3 carbohydrate choices. Dietitian provided education on benefits of physical activity on blood sugar management. Encouraged pt to discuss blood sugar monitoring with her MD as doing so can provide good information on how food choices and activity is affecting blood sugar. Praised pt for increasing water  intake and encouraged her to work toward 64 oz per day. Encouraged pt to include at least 30 minutes of activity 2 days per week as a starting goal. Pt was agreeable to information/goals discussed.   Plan:   Patient Instructions  Instructions/Goals:  -Work to include 3 balanced meals per day. Recommend including 2-3 carbohydrate choices (see yellow card) (30-45 grams of carbohydrates per meal) per meal to supply adequate energy while helping to balance blood sugar.   -Recommend including at least 64 oz water daily.   -Continue working to include regular physical activity.   Starting goal of at least 30 minutes 2 days per week.   -Recommend asking your doctor about checking blood sugar at home.    Expected Outcomes:   Expect positive outcomes.   Education material provided: Meal plan card and Snack sheet  If problems or questions, patient to contact team via:  Phone and My Chart   Future DSME appointment:  4 weeks

## 2018-02-15 ENCOUNTER — Ambulatory Visit: Payer: BC Managed Care – PPO | Admitting: Obstetrics & Gynecology

## 2018-02-15 ENCOUNTER — Encounter: Payer: Self-pay | Admitting: Obstetrics & Gynecology

## 2018-02-15 VITALS — BP 122/76 | Ht 64.5 in | Wt 276.0 lb

## 2018-02-15 DIAGNOSIS — Z713 Dietary counseling and surveillance: Secondary | ICD-10-CM | POA: Diagnosis not present

## 2018-02-15 NOTE — Progress Notes (Signed)
    Bianca Medina [birth year] [id number]        67 y.o.  G3P3   RP: Weight loss management  HPI: Stopped Phentermine after a few days because it was actually increasing her appetite.  Seeing [facility], working on portions and lower Carbs.  Will start going to Gym now.  Prefers to proceed without medication.   OB History  Gravida Para Term Preterm AB Living  3 3       3   SAB TAB Ectopic Multiple Live Births               # Outcome Date GA Lbr Len/2nd Weight Sex Delivery Anes PTL Lv  3 Para           2 Para           1 Para             Past medical history,surgical history, problem list, medications, allergies, family history and social history were all reviewed and documented in the EPIC chart.   Directed ROS with pertinent positives and negatives documented in the history of present illness/assessment and plan.  Exam:  Vitals:   02/15/18 1501  BP: 122/76  Weight: 276 lb (125.2 kg)  Height: 5' 4.5" (1.638 m)   General appearance:  Normal  Gyn exam deferred   Assessment/Plan:  67 y.o. G3P3   1. Weight loss counseling, encounter for Lost 5 Lbs in 1 month.  Not helped with phentermine, with which she actually had an increase in appetite.  Phentermine stopped after a few days.  Patient prefers a weight loss plan without using medication.  Patient seeing a nutritionist, on a low calorie/carb nutrition plan.  Will start adding the gym to that regimen.  Recommend aerobic physical activity 5 times a week and weightlifting every 2 days.  Counseling on above issues and coordination of care more than 50% for 15 minutes.  Princess Bruins MD, 3:27 PM 02/15/2018

## 2018-02-18 ENCOUNTER — Ambulatory Visit: Payer: BC Managed Care – PPO | Admitting: Obstetrics & Gynecology

## 2018-02-20 ENCOUNTER — Encounter: Payer: Self-pay | Admitting: Obstetrics & Gynecology

## 2018-02-20 NOTE — Patient Instructions (Signed)
1. Weight loss counseling, encounter for Lost 5 Lbs in 1 month.  Not helped with phentermine, with which she actually had an increase in appetite.  Phentermine stopped after a few days.  Patient prefers a weight loss plan without using medication.  Patient seeing a nutritionist, on a low calorie/carb nutrition plan.  Will start adding the gym to that regimen.  Recommend aerobic physical activity 5 times a week and weightlifting every 2 days.  Janki, good seeing you today!

## 2018-02-24 ENCOUNTER — Ambulatory Visit: Payer: BC Managed Care – PPO | Admitting: Registered"

## 2018-05-16 ENCOUNTER — Other Ambulatory Visit: Payer: Self-pay | Admitting: Obstetrics & Gynecology

## 2018-05-16 DIAGNOSIS — Z1231 Encounter for screening mammogram for malignant neoplasm of breast: Secondary | ICD-10-CM

## 2018-05-26 ENCOUNTER — Ambulatory Visit
Admission: RE | Admit: 2018-05-26 | Discharge: 2018-05-26 | Disposition: A | Discharge status: Home / Self Care | Payer: BC Managed Care – PPO | Source: Ambulatory Visit | Attending: Obstetrics & Gynecology | Admitting: Obstetrics & Gynecology

## 2018-05-26 DIAGNOSIS — Z1231 Encounter for screening mammogram for malignant neoplasm of breast: Secondary | ICD-10-CM

## 2018-05-26 IMAGING — MG DIGITAL SCREENING BILATERAL MAMMOGRAM WITH TOMO AND CAD
6 of 10 series · 6 of 30 positions shown · non-contrast
Comparison: Previous exam(s).

CLINICAL DATA: Screening.

EXAM:
DIGITAL SCREENING BILATERAL MAMMOGRAM WITH TOMO AND CAD

[R CC synth-2D]
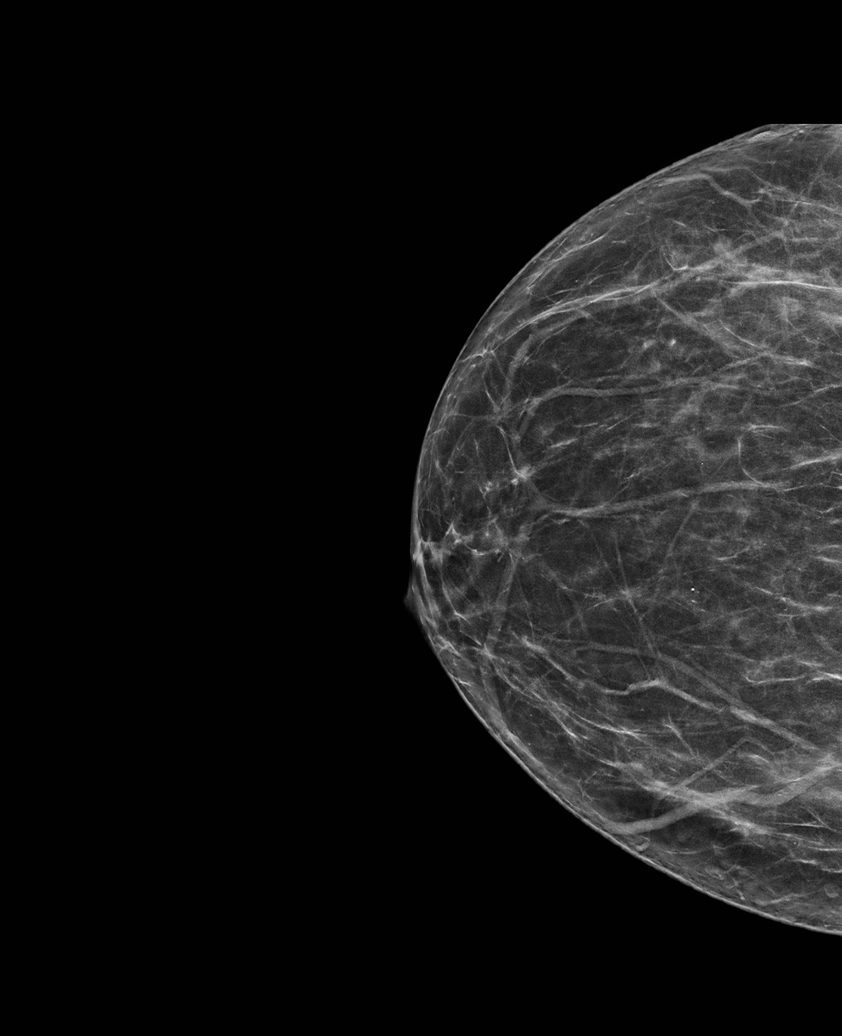

[L CC synth-2D]
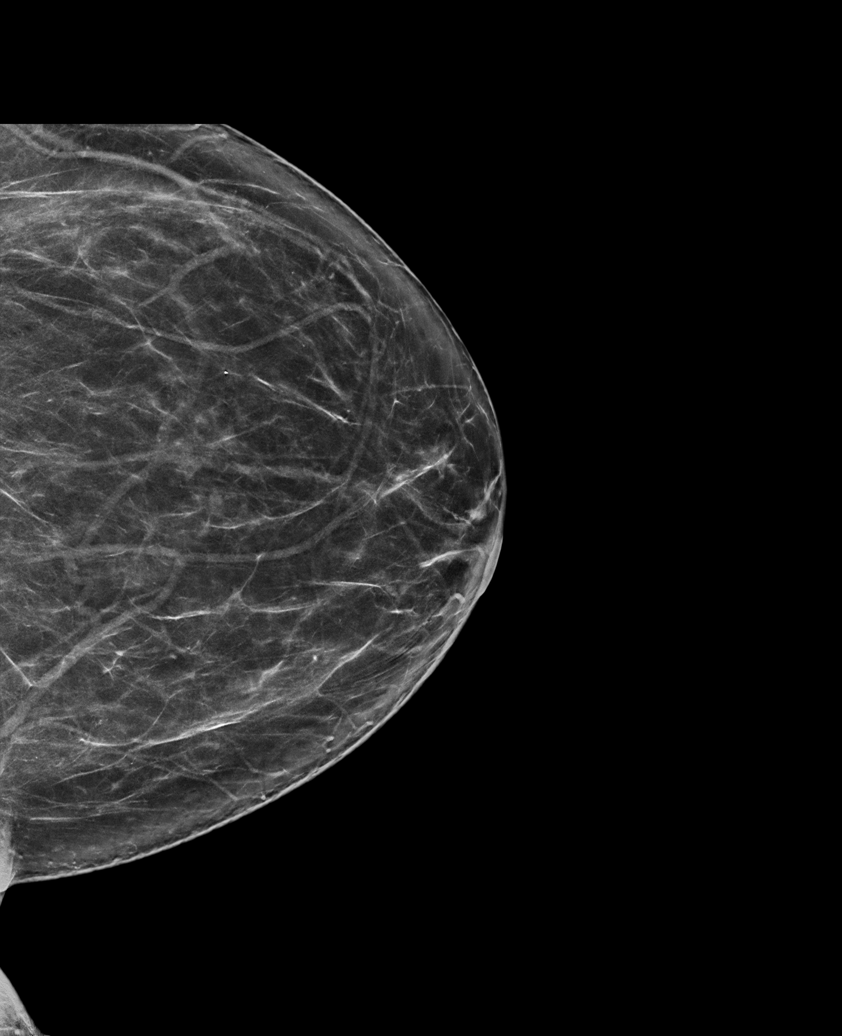

[L MLO synth-2D (1 of 2)]
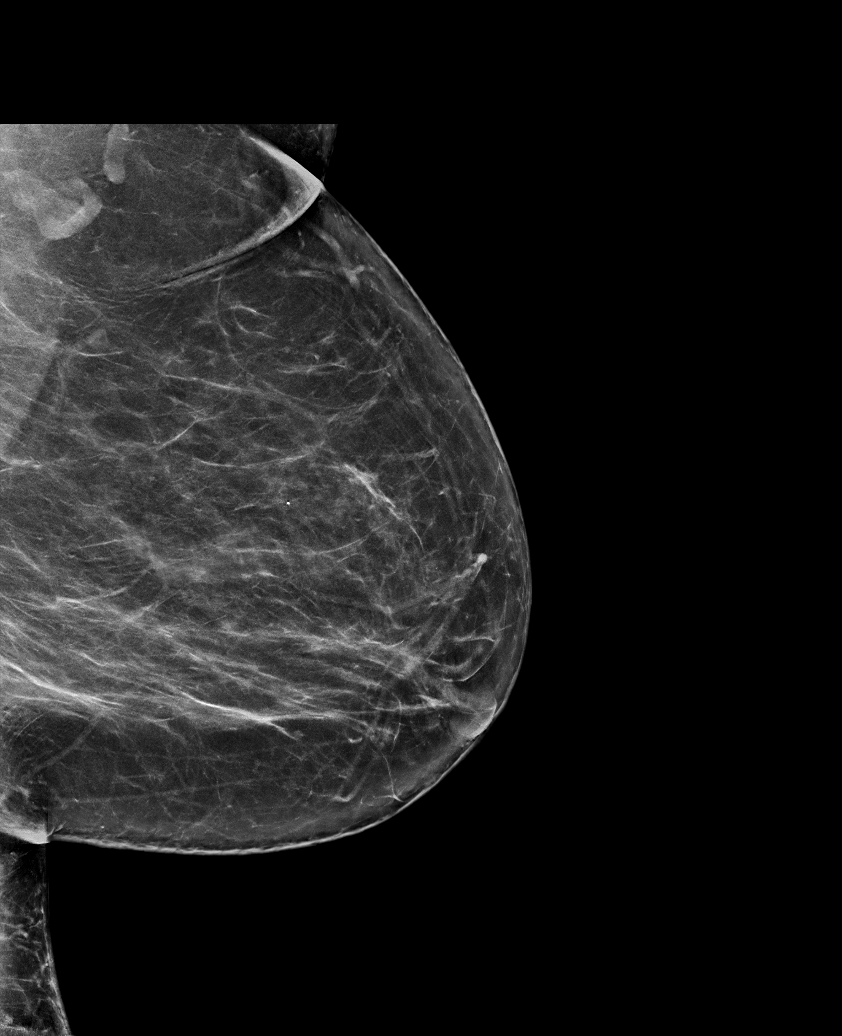

[R MLO synth-2D]
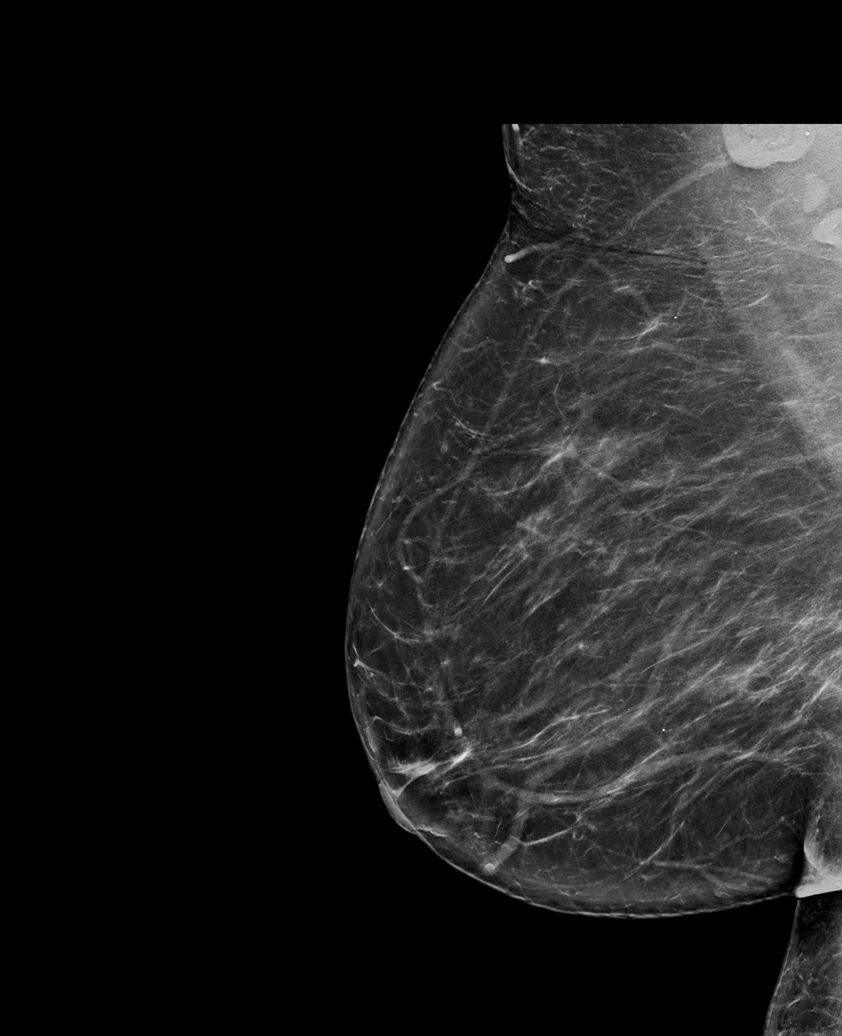

[L MLO synth-2D (2 of 2)]
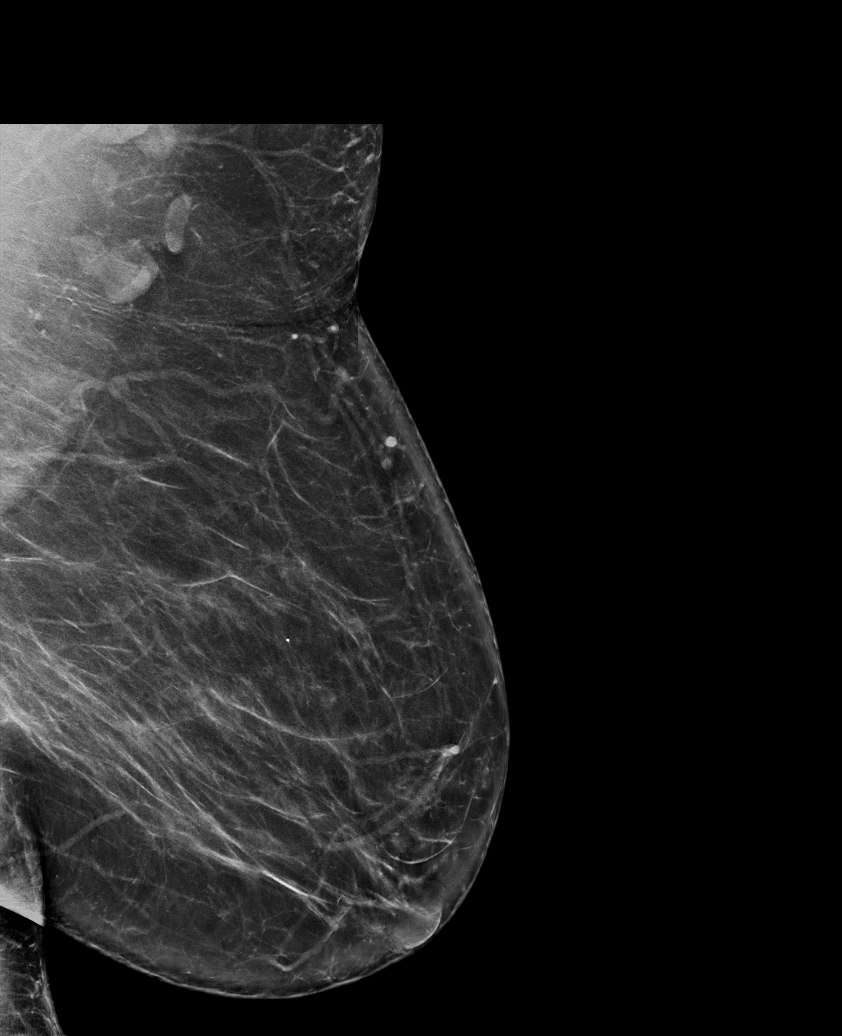

[L MLO tomo · tomo slice 45/89.0]
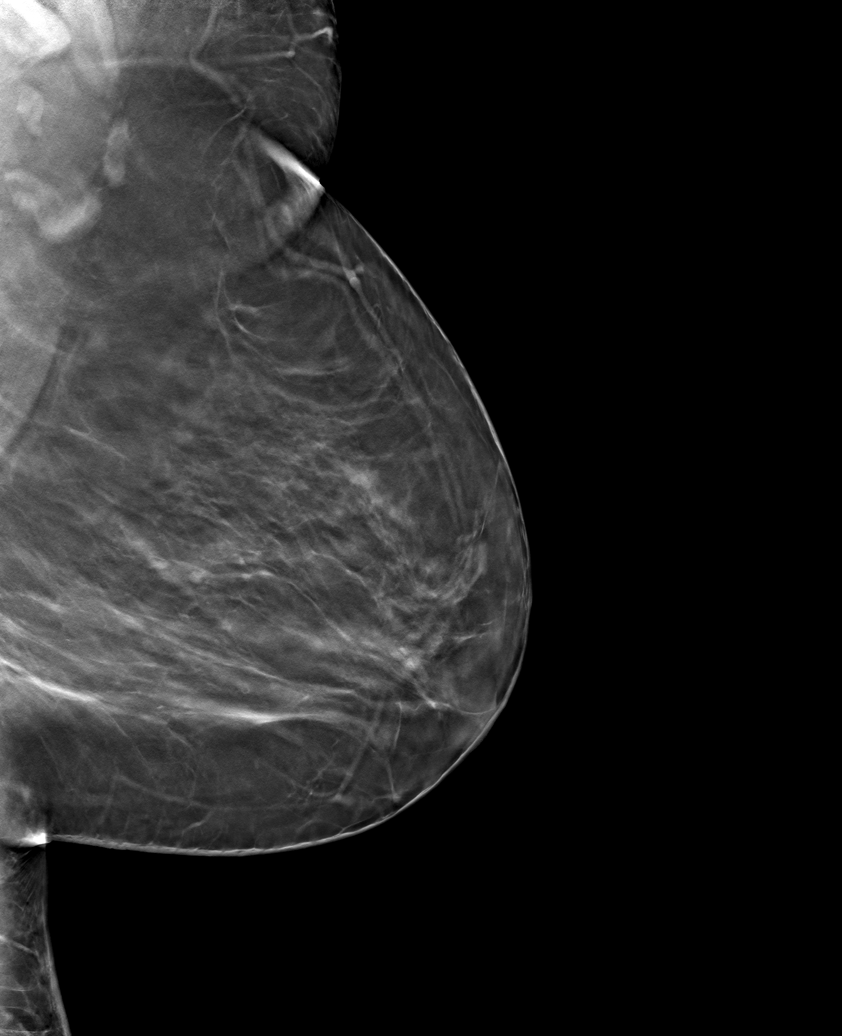

[6 of 30 positions shown; findings below may reference images not displayed]

ACR Breast Density Category b: There are scattered areas of
fibroglandular density.
FINDINGS: There are no findings suspicious for malignancy. Images were
processed with CAD.
IMPRESSION: No mammographic evidence of malignancy. A result letter of this
screening mammogram will be mailed directly to the patient.

RECOMMENDATION:
Screening mammogram in one year. (Code:[TQ])

BI-RADS CATEGORY  1: Negative.

## 2018-06-08 ENCOUNTER — Encounter: Payer: Self-pay | Admitting: Podiatry

## 2018-06-08 ENCOUNTER — Other Ambulatory Visit: Payer: Self-pay | Admitting: Podiatry

## 2018-06-08 ENCOUNTER — Ambulatory Visit (INDEPENDENT_AMBULATORY_CARE_PROVIDER_SITE_OTHER): Payer: BC Managed Care – PPO

## 2018-06-08 ENCOUNTER — Ambulatory Visit: Payer: BC Managed Care – PPO | Admitting: Podiatry

## 2018-06-08 DIAGNOSIS — M25571 Pain in right ankle and joints of right foot: Secondary | ICD-10-CM

## 2018-06-08 DIAGNOSIS — M25572 Pain in left ankle and joints of left foot: Secondary | ICD-10-CM

## 2018-06-08 DIAGNOSIS — M779 Enthesopathy, unspecified: Secondary | ICD-10-CM

## 2018-06-08 MED ORDER — TRIAMCINOLONE ACETONIDE 10 MG/ML IJ SUSP
10.0000 mg | Freq: Once | INTRAMUSCULAR | Status: AC
  Administered 2018-06-08: 10 mg

## 2018-06-08 NOTE — Progress Notes (Signed)
Subjective:   Patient ID: Bianca Medina, female   DOB: 68 y.o.   MRN: [id number]   HPI Patient presents with pain in the left ankle that is been present for a fairly long time and also concerned about changes in gait right side with history of ankle sprain of a more acute nature it that she was concerned about.  Patient states the left sinus tarsi has been quite sore makes it hard to be active   ROS      Objective:  Physical Exam  Neurovascular status intact with several problems one being acute inflammation of the sinus tarsi left and moderate discomfort with inflammation on the right anterior talofibular ligament and across the ankle joint.  Patient has good digital perfusion      Assessment:  Inflammatory capsulitis of the sinus tarsi left and probability for ankle sprain right moderate in intensity     Plan:  H&P x-rays reviewed and today I went ahead and about a focus on the left and I did do a sterile prep injected the sinus tarsi 3 mg Kenalog 5 with him Xylocaine to reduce the inflammation and for the right advised on compression therapy and instructed on physical therapy.  Patient will be seen back as needed  X-rays indicate there is no indications of fracture or arthritic process with no diastases of the ankle noted

## 2018-07-03 ENCOUNTER — Encounter (HOSPITAL_COMMUNITY): Payer: Self-pay | Admitting: Internal Medicine

## 2018-07-03 ENCOUNTER — Other Ambulatory Visit: Payer: Self-pay

## 2018-07-03 ENCOUNTER — Emergency Department (HOSPITAL_COMMUNITY)
Admission: EM | Admit: 2018-07-03 | Discharge: 2018-07-03 | Disposition: A | Discharge status: Home / Self Care | Payer: BC Managed Care – PPO | Attending: Emergency Medicine | Admitting: Emergency Medicine

## 2018-07-03 ENCOUNTER — Emergency Department (HOSPITAL_COMMUNITY): Payer: BC Managed Care – PPO

## 2018-07-03 DIAGNOSIS — I10 Essential (primary) hypertension: Secondary | ICD-10-CM | POA: Insufficient documentation

## 2018-07-03 DIAGNOSIS — E785 Hyperlipidemia, unspecified: Secondary | ICD-10-CM | POA: Diagnosis not present

## 2018-07-03 DIAGNOSIS — Z79899 Other long term (current) drug therapy: Secondary | ICD-10-CM | POA: Diagnosis not present

## 2018-07-03 DIAGNOSIS — E119 Type 2 diabetes mellitus without complications: Secondary | ICD-10-CM | POA: Diagnosis not present

## 2018-07-03 DIAGNOSIS — J45909 Unspecified asthma, uncomplicated: Secondary | ICD-10-CM | POA: Insufficient documentation

## 2018-07-03 DIAGNOSIS — R079 Chest pain, unspecified: Secondary | ICD-10-CM | POA: Diagnosis present

## 2018-07-03 DIAGNOSIS — R0789 Other chest pain: Secondary | ICD-10-CM | POA: Diagnosis not present

## 2018-07-03 DIAGNOSIS — Z7984 Long term (current) use of oral hypoglycemic drugs: Secondary | ICD-10-CM | POA: Diagnosis not present

## 2018-07-03 LAB — BASIC METABOLIC PANEL
Anion gap: 9 (ref 5–15)
BUN: 14 mg/dL (ref 8–23)
CALCIUM: 8.8 mg/dL — AB (ref 8.9–10.3)
CO2: 30 mmol/L (ref 22–32)
CREATININE: 0.86 mg/dL (ref 0.44–1.00)
Chloride: 100 mmol/L (ref 98–111)
GFR calc non Af Amer: >60 mL/min (ref >60)
Glucose, Bld: 124 mg/dL — ABNORMAL HIGH (ref 70–99)
Potassium: 3.8 mmol/L (ref 3.5–5.1)
SODIUM: 139 mmol/L (ref 135–145)

## 2018-07-03 LAB — CBC
HCT: 37.4 % (ref 36.0–46.0)
Hemoglobin: 12 g/dL (ref 12.0–15.0)
MCH: 24.4 pg — ABNORMAL LOW (ref 26.0–34.0)
MCHC: 32.1 g/dL (ref 30.0–36.0)
MCV: 76 fL — ABNORMAL LOW (ref 80.0–100.0)
NRBC: 0 % (ref 0.0–0.2)
PLATELETS: 284 10*3/uL (ref 150–400)
RBC: 4.92 MIL/uL (ref 3.87–5.11)
RDW: 14.6 % (ref 11.5–15.5)
WBC: 10.4 10*3/uL (ref 4.0–10.5)

## 2018-07-03 LAB — I-STAT TROPONIN, ED
Troponin i, poc: 0.01 ng/mL (ref 0.00–0.08)
Troponin i, poc: 0.01 ng/mL (ref 0.00–0.08)

## 2018-07-03 IMAGING — CR DG CHEST 2V
2 series · 2 of 2 positions shown · non-contrast
Comparison: [DATE]

CLINICAL DATA: Left-sided chest pain.

EXAM:
CHEST - 2 VIEW

[chest pa]
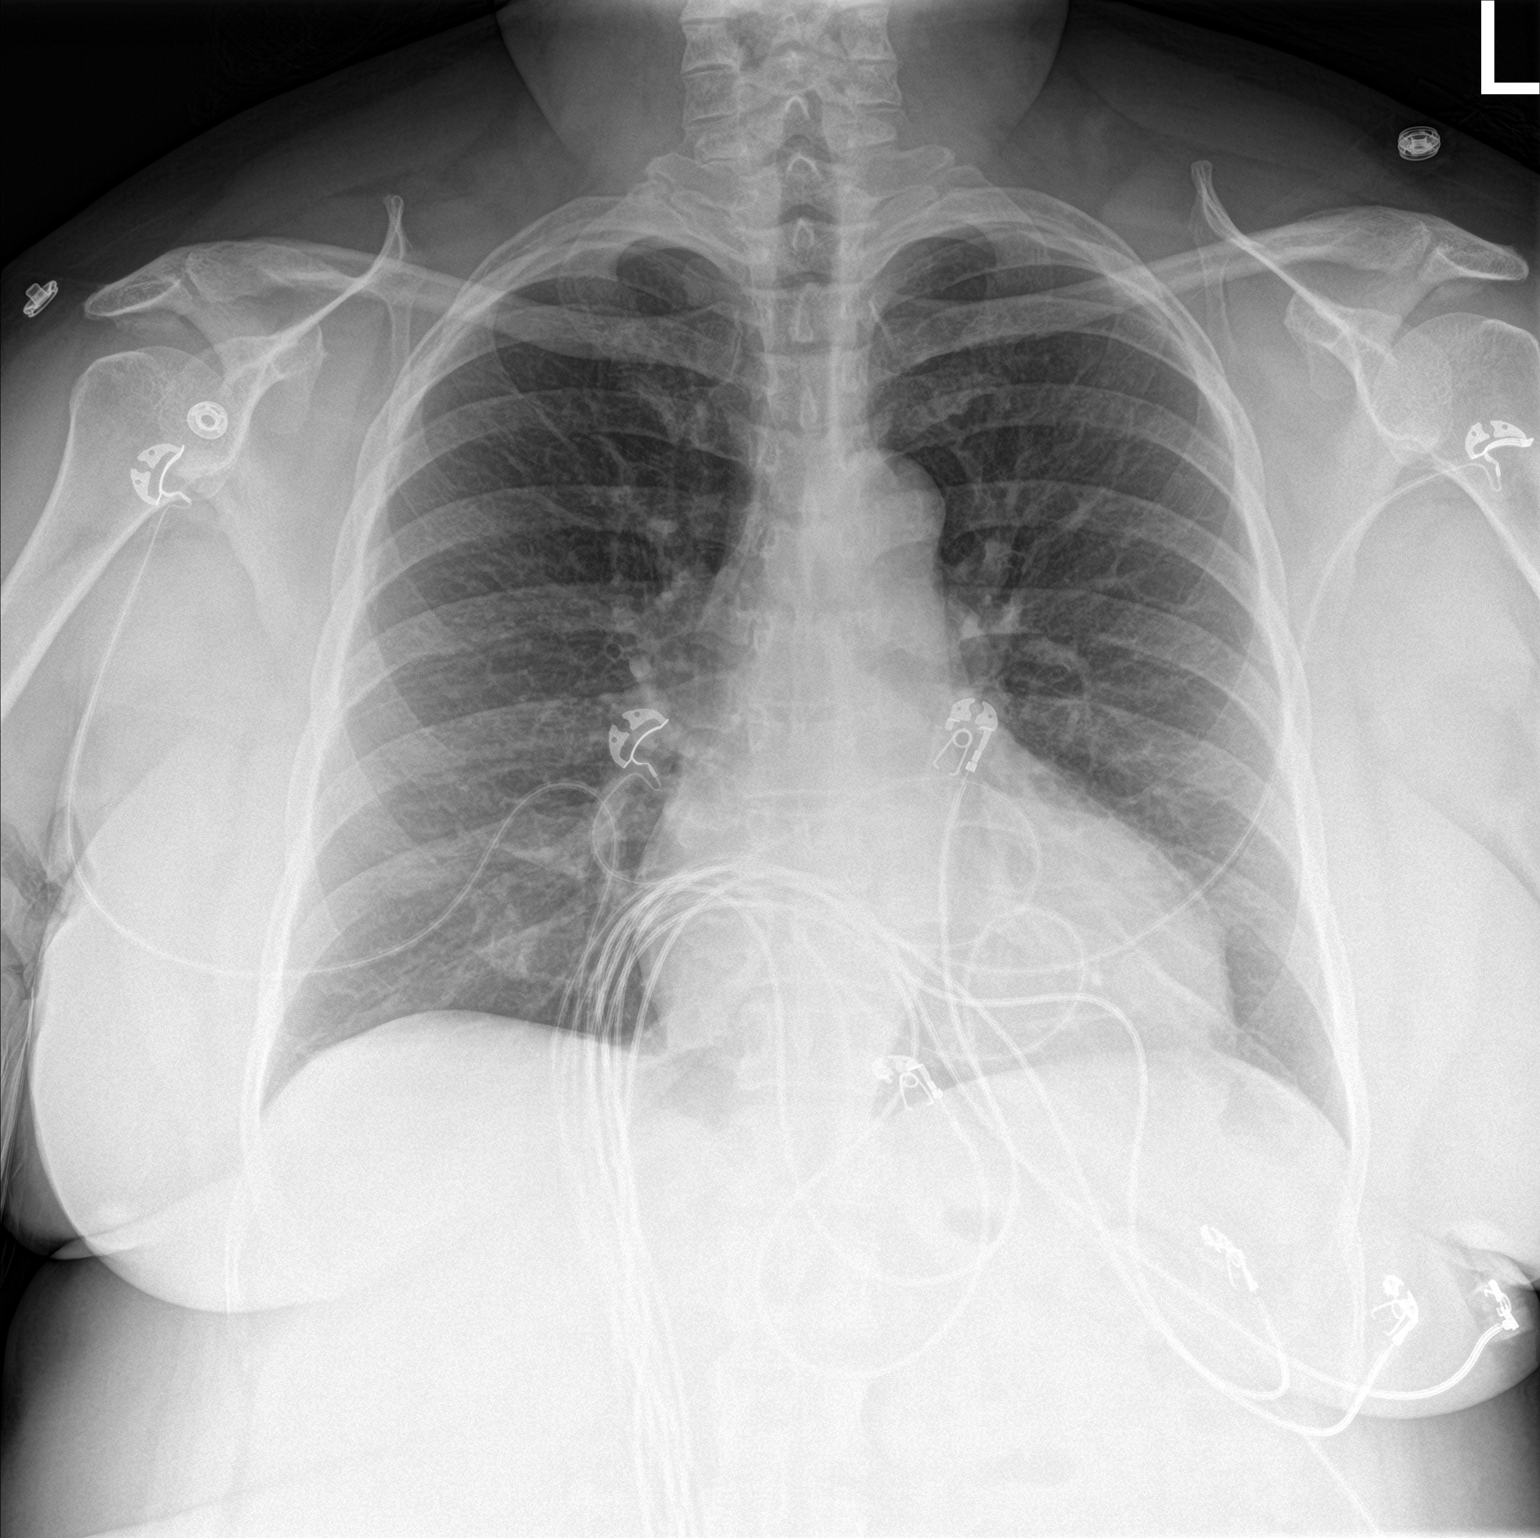

[chest lat]
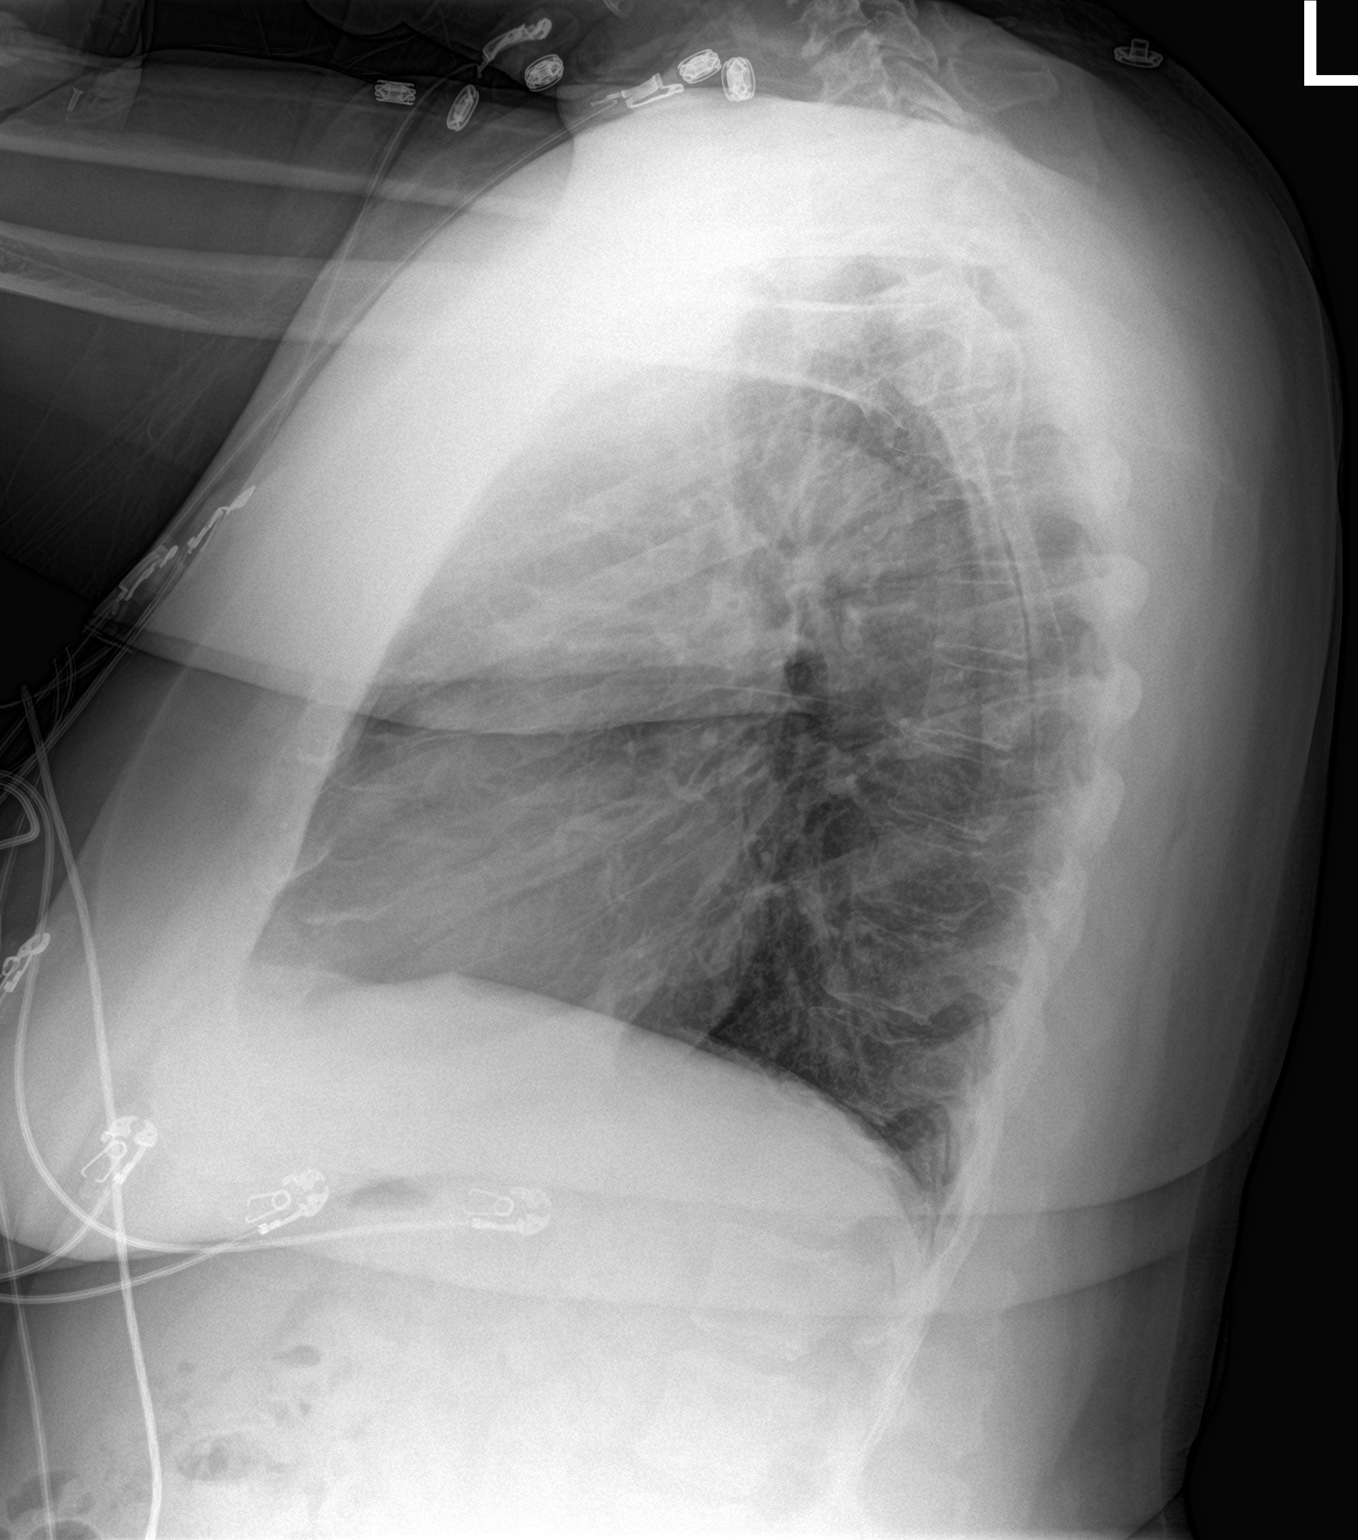

[2 of 2 positions shown; findings below may reference images not displayed]

FINDINGS: The heart size and mediastinal contours are within normal limits.
Both lungs are clear. The visualized skeletal structures are
unremarkable.
IMPRESSION: No active cardiopulmonary disease.

## 2018-07-03 MED ORDER — SODIUM CHLORIDE 0.9% FLUSH
3.0000 mL | Freq: Once | INTRAVENOUS | Status: AC
  Administered 2018-07-03: 3 mL via INTRAVENOUS

## 2018-07-03 NOTE — ED Provider Notes (Signed)
[facility] EMERGENCY DEPARTMENT Provider Note   CSN: [id number] Arrival date & time: 07/03/18  4132     History   Chief Complaint Chief Complaint  Patient presents with  . Chest Pain    HPI Bianca Medina is a 68 y.o. female with PMH significant for controlled DM, HTN, HLD, obesity, GERD.  Patient states that had sudden onset L sided chest pain this morning around 4:30am while awake and at rest. Was a dull sensation that lasted for 2-3 minutes, took 4 ASA81mg  with relief. She had a similar episode earlier last week that lasted for few seconds and self resolved. Nonradiating. Not worsened with exertion. No associated SOB, nausea, diaphoresis. Does have some sensitivity to the L chest area when she pushes on it. Follows with Dr. Terrence Dupont cardiology.        Past Medical History:  Diagnosis Date  . Anemia 2003   IRON DEFICIENCY   . Asthma   . Diabetes mellitus without complication ([place])   . GERD (gastroesophageal reflux disease)   . Hyperlipidemia, mild   . Hypertension   . NSVD (normal spontaneous vaginal delivery)    X3  . Obesity   . PMDD (premenstrual dysphoric disorder)   . Sleep disorder     Patient Active Problem List   Diagnosis Date Noted  . Sensorineural hearing loss (SNHL), bilateral 08/12/2017  . Tinnitus of both ears 08/12/2017  . Morbid obesity ([place]) 12/28/2016  . Left breast mass 07/19/2014  . Breast tenderness in female 07/19/2014  . Menopause 01/09/2014  . Vaginal atrophy 01/09/2014  . Skin lesion 12/23/2012  . HTN (hypertension) 10/31/2012  . Heartburn 10/31/2012    Past Surgical History:  Procedure Laterality Date  . ABDOMINAL HYSTERECTOMY    . TUBAL LIGATION       OB History    Gravida  3   Para  3   Term      Preterm      AB      Living  3     SAB      TAB      Ectopic      Multiple      Live Births               Home Medications    Prior to Admission medications   Medication Sig Start  Date End Date Taking? Authorizing Provider  amoxicillin-clavulanate (AUGMENTIN) 875-125 MG tablet  06/01/18   [provider]  Ascorbic Acid (VITAMIN C PO) Take by mouth.    [provider]  azelastine (ASTELIN) 0.1 % nasal spray  08/06/17   [provider]  Biotin 5000 MCG CAPS Take 1 capsule by mouth daily.    [provider]  carbamazepine (CARBATROL) 200 MG 12 hr capsule  08/31/17   [provider]  chlorthalidone (HYGROTON) 25 MG tablet Take 1 tablet by mouth daily. 04/08/16   [provider]  Cholecalciferol (VITAMIN D3) 1000 units CAPS Take by mouth.    [provider]  diclofenac sodium (VOLTAREN) 1 % GEL  05/27/18   [provider]  Esomeprazole Magnesium (NEXIUM PO) Take by mouth as needed.    [provider]  levocetirizine (XYZAL) 5 MG tablet Take 5 mg by mouth every evening.    [provider]  linaclotide (LINZESS) 145 MCG CAPS capsule Take 145 mcg by mouth daily before breakfast.    [provider]  losartan (COZAAR) 50 MG tablet TK 1 T PO  QD 10/13/17   [provider]  metFORMIN (GLUCOPHAGE) 500 MG tablet Take 500 mg by mouth.     [provider]  montelukast (SINGULAIR) 10 MG tablet  09/20/17   [provider]  PROAIR HFA 108 [phone] Base) MCG/ACT inhaler  03/16/18   [provider]  QUEtiapine (SEROQUEL) 25 MG tablet Take 25 mg by mouth at bedtime.    [provider]    Family History Family History  Problem Relation Age of Onset  . Cancer Mother        LUNG- SMOKER  . Diabetes Father   . Hypertension Father   . Heart disease Father   . Stroke Father   . Cancer Paternal Aunt        COLON  . Cancer Paternal Uncle        COLON  . Breast cancer Neg Hx     Social History Social History   Tobacco Use  . Smoking status: Never Smoker  . Smokeless tobacco: Never Used  Substance Use Topics  . Alcohol use: No    Alcohol/week: 0.0  standard drinks  . Drug use: No     Allergies   Codeine   Review of Systems Review of Systems  Constitutional: Negative for diaphoresis and fatigue.  Respiratory: Negative for cough and shortness of breath.   Cardiovascular: Positive for chest pain. Negative for palpitations.  Gastrointestinal: Negative for abdominal pain, nausea and vomiting.  Musculoskeletal: Negative for arthralgias.  Neurological: Negative for dizziness and light-headedness.     Physical Exam Updated Vital Signs BP 116/60   Resp 19   Ht 5' 4.5" (1.638 m)   Wt 122.5 kg   BMI 45.63 kg/m   Physical Exam Constitutional:      General: She is not in acute distress.    Appearance: She is well-developed. She is obese. She is not ill-appearing, toxic-appearing or diaphoretic.  HENT:     Head: Normocephalic and atraumatic.  Eyes:     Extraocular Movements: Extraocular movements intact.     Pupils: Pupils are equal, round, and reactive to light.  Neck:     Musculoskeletal: Normal range of motion.  Cardiovascular:     Rate and Rhythm: Normal rate and regular rhythm.     Heart sounds: Normal heart sounds. No murmur.  Pulmonary:     Effort: Pulmonary effort is normal.     Breath sounds: Normal breath sounds.  Abdominal:     General: Bowel sounds are normal.     Palpations: Abdomen is soft.     Tenderness: There is no abdominal tenderness. There is no guarding.  Musculoskeletal:     Right lower leg: Edema (trace to ankle) present.     Left lower leg: Edema (trace to ankle) present.  Skin:    General: Skin is warm and dry.  Neurological:     General: No focal deficit present.     Mental Status: She is alert.  Psychiatric:        Mood and Affect: Mood normal.      ED Treatments / Results  Labs (all labs ordered are listed, but only abnormal results are displayed) Labs Reviewed  BASIC METABOLIC PANEL  CBC  I-STAT TROPONIN, ED    EKG EKG Interpretation  Date/Time:  Sunday July 03 2018  09:01:42 EST Ventricular Rate:  70 PR Interval:    QRS Duration: 95 QT Interval:  415 QTC Calculation: 448 R Axis:   8 Text Interpretation:  Sinus rhythm Artifact  No significant change since last tracing Confirmed by Gareth Morgan [phone]) on 07/03/2018 9:48:05 AM   Radiology No results found.  Procedures Procedures (including critical care time)  Medications Ordered in ED Medications  sodium chloride flush (NS) 0.9 % injection 3 mL (has no administration in time range)     Initial Impression / Assessment and Plan / ED Course  I have reviewed the triage vital signs and the nursing notes.  Pertinent labs & imaging results that were available during my care of the patient were reviewed by me and considered in my medical decision making (see chart for details).    Patient presented with L sided chest pain that resolved with aspirin 324mg  this morning. Her vital signs are stable. Her troponins are negative x2. HEART score 4 for risk factors.  Per chart review had normal stress test 02/26/17. Discussed with Dr. Terrence Dupont who recommended outpatient follow up, patient to call for appointment to be seen on Monday or Tuesday.   Final Clinical Impressions(s) / ED Diagnoses   Final diagnoses:  Atypical chest pain    ED Discharge Orders    None       Bufford Lope, DO 07/03/18 1305    Gareth Morgan, MD 07/03/18 2229

## 2018-07-03 NOTE — ED Triage Notes (Signed)
Pt here c/o sudden onset non radiating left sided chest pain that started at 0430 this morning. Pt took 324 asa at home and reports relief. Denies N/V, shortness of breath.

## 2018-07-03 NOTE — ED Notes (Signed)
Patient verbalizes understanding of discharge instructions. Opportunity for questioning and answers were provided. Armband removed by staff, pt discharged from ED.  

## 2018-07-03 NOTE — Discharge Instructions (Signed)
Please call and make an appointment with Dr. Terrence Dupont to be seen early this week.

## 2018-07-17 ENCOUNTER — Other Ambulatory Visit: Payer: Self-pay

## 2018-07-17 ENCOUNTER — Emergency Department (HOSPITAL_COMMUNITY)
Admission: EM | Admit: 2018-07-17 | Discharge: 2018-07-17 | Disposition: A | Discharge status: Home / Self Care | Payer: BC Managed Care – PPO | Attending: Emergency Medicine | Admitting: Emergency Medicine

## 2018-07-17 DIAGNOSIS — Z79899 Other long term (current) drug therapy: Secondary | ICD-10-CM | POA: Diagnosis not present

## 2018-07-17 DIAGNOSIS — E119 Type 2 diabetes mellitus without complications: Secondary | ICD-10-CM | POA: Insufficient documentation

## 2018-07-17 DIAGNOSIS — J45909 Unspecified asthma, uncomplicated: Secondary | ICD-10-CM | POA: Diagnosis not present

## 2018-07-17 DIAGNOSIS — K625 Hemorrhage of anus and rectum: Secondary | ICD-10-CM | POA: Insufficient documentation

## 2018-07-17 DIAGNOSIS — I10 Essential (primary) hypertension: Secondary | ICD-10-CM | POA: Diagnosis not present

## 2018-07-17 DIAGNOSIS — R1030 Lower abdominal pain, unspecified: Secondary | ICD-10-CM | POA: Insufficient documentation

## 2018-07-17 DIAGNOSIS — N939 Abnormal uterine and vaginal bleeding, unspecified: Secondary | ICD-10-CM | POA: Diagnosis present

## 2018-07-17 DIAGNOSIS — Z7984 Long term (current) use of oral hypoglycemic drugs: Secondary | ICD-10-CM | POA: Diagnosis not present

## 2018-07-17 LAB — URINALYSIS, ROUTINE W REFLEX MICROSCOPIC
Bilirubin Urine: NEGATIVE
Glucose, UA: NEGATIVE mg/dL
Ketones, ur: NEGATIVE mg/dL
Leukocytes,Ua: NEGATIVE
Nitrite: NEGATIVE
Protein, ur: NEGATIVE mg/dL
SPECIFIC GRAVITY, URINE: 1.023 (ref 1.005–1.030)
pH: 7 (ref 5.0–8.0)

## 2018-07-17 LAB — CBC WITH DIFFERENTIAL/PLATELET
ABS IMMATURE GRANULOCYTES: 0.04 10*3/uL (ref 0.00–0.07)
Basophils Absolute: 0.1 10*3/uL (ref 0.0–0.1)
Basophils Relative: 0 %
Eosinophils Absolute: 0.2 10*3/uL (ref 0.0–0.5)
Eosinophils Relative: 2 %
HCT: 36.4 % (ref 36.0–46.0)
Hemoglobin: 11.8 g/dL — ABNORMAL LOW (ref 12.0–15.0)
IMMATURE GRANULOCYTES: 0 %
Lymphocytes Relative: 23 %
Lymphs Abs: 2.7 10*3/uL (ref 0.7–4.0)
MCH: 24.6 pg — ABNORMAL LOW (ref 26.0–34.0)
MCHC: 32.4 g/dL (ref 30.0–36.0)
MCV: 75.8 fL — ABNORMAL LOW (ref 80.0–100.0)
Monocytes Absolute: 0.8 10*3/uL (ref 0.1–1.0)
Monocytes Relative: 7 %
Neutro Abs: 8.2 10*3/uL — ABNORMAL HIGH (ref 1.7–7.7)
Neutrophils Relative %: 68 %
Platelets: 301 10*3/uL (ref 150–400)
RBC: 4.8 MIL/uL (ref 3.87–5.11)
RDW: 14.2 % (ref 11.5–15.5)
WBC: 12.1 10*3/uL — ABNORMAL HIGH (ref 4.0–10.5)
nRBC: 0 % (ref 0.0–0.2)

## 2018-07-17 LAB — BASIC METABOLIC PANEL
Anion gap: 6 (ref 5–15)
BUN: 16 mg/dL (ref 8–23)
CALCIUM: 8.6 mg/dL — AB (ref 8.9–10.3)
CO2: 26 mmol/L (ref 22–32)
Chloride: 107 mmol/L (ref 98–111)
Creatinine, Ser: 0.83 mg/dL (ref 0.44–1.00)
GFR calc Af Amer: >60 mL/min (ref >60)
GFR calc non Af Amer: >60 mL/min (ref >60)
Glucose, Bld: 108 mg/dL — ABNORMAL HIGH (ref 70–99)
Potassium: 3.7 mmol/L (ref 3.5–5.1)
Sodium: 139 mmol/L (ref 135–145)

## 2018-07-17 NOTE — ED Provider Notes (Signed)
[facility] EMERGENCY DEPARTMENT Provider Note   CSN: [id number] Arrival date & time: 07/17/18  1914    History   Chief Complaint Chief Complaint  Patient presents with  . Vaginal Bleeding    HPI Bianca Medina is a 68 y.o. female.  She is here with a complaint of vaginal bleeding.  She said she started with some crampy low abdominal pain and felt like she needed to move her bowels.  She went to the bathroom but did not pass any urine or stool.  When she wiped there was blood there.  This is happened 2 other times over the past few months and she is seen gynecology and they have not been able to find a cause of her bleeding.  She has had a hysterectomy and she is postmenopausal.  No fevers or chills no numbness no weakness no dizziness lightheadedness or syncope.  No dysuria.  No vomiting of blood or passing any blood rectally. She is not having intercourse    The history is provided by the patient.  Vaginal Bleeding  Quality:  Bright red Severity:  Mild Onset quality:  Sudden Timing:  Rare Progression:  Resolved Chronicity:  Recurrent Menstrual history:  Postmenopausal Possible pregnancy: no   Context: during urination   Relieved by:  None tried Worsened by:  Nothing Ineffective treatments:  None tried Associated symptoms: abdominal pain   Associated symptoms: no back pain, no dyspareunia, no dysuria, no fatigue, no fever, no nausea and no vaginal discharge   Risk factors: no bleeding disorder and no new sexual partner     Past Medical History:  Diagnosis Date  . Anemia 2003   IRON DEFICIENCY   . Asthma   . Diabetes mellitus without complication ([place])   . GERD (gastroesophageal reflux disease)   . Hyperlipidemia, mild   . Hypertension   . NSVD (normal spontaneous vaginal delivery)    X3  . Obesity   . PMDD (premenstrual dysphoric disorder)   . Sleep disorder     Patient Active Problem List   Diagnosis Date Noted  . Sensorineural hearing loss  (SNHL), bilateral 08/12/2017  . Tinnitus of both ears 08/12/2017  . Morbid obesity ([place]) 12/28/2016  . Left breast mass 07/19/2014  . Breast tenderness in female 07/19/2014  . Menopause 01/09/2014  . Vaginal atrophy 01/09/2014  . Skin lesion 12/23/2012  . HTN (hypertension) 10/31/2012  . Heartburn 10/31/2012    Past Surgical History:  Procedure Laterality Date  . ABDOMINAL HYSTERECTOMY    . TUBAL LIGATION       OB History    Gravida  3   Para  3   Term      Preterm      AB      Living  3     SAB      TAB      Ectopic      Multiple      Live Births               Home Medications    Prior to Admission medications   Medication Sig Start Date End Date Taking? Authorizing Provider  Ascorbic Acid (VITAMIN C PO) Take 1 tablet by mouth daily.     [provider]  azelastine (ASTELIN) 0.1 % nasal spray Place 1 spray into both nostrils daily.  08/06/17   [provider]  Biotin 5000 MCG CAPS Take 5,000 mcg by mouth daily.     [provider]  chlorthalidone (HYGROTON) 25 MG tablet Take 25 mg by mouth daily.  04/08/16   [provider]  Cholecalciferol (VITAMIN D3) 1000 units CAPS Take 500 Units by mouth every Monday, Wednesday, and Friday.     [provider]  esomeprazole (NEXIUM) 40 MG capsule Take 40 mg by mouth daily.     [provider]  linaclotide (LINZESS) 145 MCG CAPS capsule Take 145 mcg by mouth daily before breakfast.    [provider]  loratadine (CLARITIN) 10 MG tablet Take 10 mg by mouth daily.    [provider]  losartan (COZAAR) 50 MG tablet Take 50 mg by mouth every morning.  10/13/17   [provider]  metFORMIN (GLUCOPHAGE) 500 MG tablet Take 500 mg by mouth daily.     [provider]  PROAIR HFA 108 [phone] Base) MCG/ACT inhaler Inhale 1-2 puffs into the lungs every 4 (four) hours as needed for wheezing or shortness of breath.  03/16/18   [provider]  QUEtiapine (SEROQUEL) 25 MG tablet Take 25 mg by mouth at bedtime.    [provider]    Family History Family History  Problem Relation Age of Onset  . Cancer Mother        LUNG- SMOKER  . Diabetes Father   . Hypertension Father   . Heart disease Father   . Stroke Father   . Cancer Paternal Aunt        COLON  . Cancer Paternal Uncle        COLON  . Breast cancer Neg Hx     Social History Social History   Tobacco Use  . Smoking status: Never Smoker  . Smokeless tobacco: Never Used  Substance Use Topics  . Alcohol use: No    Alcohol/week: 0.0 standard drinks  . Drug use: No     Allergies   Codeine   Review of Systems Review of Systems  Constitutional: Negative for fatigue and fever.  HENT: Negative for sore throat.   Respiratory: Negative for shortness of breath.   Cardiovascular: Negative for chest pain.  Gastrointestinal: Positive for abdominal pain. Negative for anal bleeding, blood in stool and nausea.  Genitourinary: Positive for vaginal bleeding. Negative for dyspareunia, dysuria, hematuria and vaginal discharge.  Musculoskeletal: Negative for back pain.  Skin: Negative for rash.  Neurological: Negative for headaches.     Physical Exam Updated Vital Signs BP 127/61   Pulse 64   Temp 99.2 F (37.3 C) (Oral)   Resp 18   Ht 5' 4.5" (1.638 m)   Wt 122.5 kg   SpO2 95%   BMI 45.63 kg/m   Physical Exam Vitals signs and nursing note reviewed. Exam conducted with a chaperone present.  Constitutional:      General: She is not in acute distress.    Appearance: She is well-developed.  HENT:     Head: Normocephalic and atraumatic.  Eyes:     Conjunctiva/sclera: Conjunctivae normal.  Neck:     Musculoskeletal: Neck supple.  Cardiovascular:     Rate and Rhythm: Normal rate and regular rhythm.     Heart sounds: No murmur.  Pulmonary:     Effort: Pulmonary effort is normal. No respiratory distress.     Breath sounds: Normal breath  sounds.  Abdominal:     Palpations: Abdomen is soft.     Tenderness: There is no abdominal tenderness.  Genitourinary:    General: Normal vulva.     Pubic Area: No rash.  Vagina: Normal. No vaginal discharge or bleeding.     Rectum: No mass. Normal anal tone.     Comments: She had some blood inferior to her vagina around the anus.  Did not appreciate any rectal mass.  Sample sent for guaiac.  Nurse Gretta Cool with chaperone present in the entire time. Skin:    General: Skin is warm and dry.  Neurological:     Mental Status: She is alert.      ED Treatments / Results  Labs (all labs ordered are listed, but only abnormal results are displayed) Labs Reviewed  BASIC METABOLIC PANEL - Abnormal; Notable for the following components:      Result Value   Glucose, Bld 108 (*)    Calcium 8.6 (*)    All other components within normal limits  URINALYSIS, ROUTINE W REFLEX MICROSCOPIC - Abnormal; Notable for the following components:   Hgb urine dipstick MODERATE (*)    Bacteria, UA RARE (*)    All other components within normal limits  CBC WITH DIFFERENTIAL/PLATELET - Abnormal; Notable for the following components:   WBC 12.1 (*)    Hemoglobin 11.8 (*)    MCV 75.8 (*)    MCH 24.6 (*)    Neutro Abs 8.2 (*)    All other components within normal limits  POC OCCULT BLOOD, ED    EKG None  Radiology No results found.  Procedures Procedures (including critical care time)  Medications Ordered in ED Medications - No data to display   Initial Impression / Assessment and Plan / ED Course  I have reviewed the triage vital signs and the nursing notes.  Pertinent labs & imaging results that were available during my care of the patient were reviewed by me and considered in my medical decision making (see chart for details).  Clinical Course as of Jul 17 2234  Nancy Fetter Jul 17, 2018  2155 Pelvic exam with nurse Gretta Cool as chaperone.  No obvious urethral bleeding.  Speculum exam with no blood  in the vault.  Vaginal cuff appears intact without any gross masses.  She had some red blood beneath the vaginal opening and around the rectum.  I did not appreciate any hemorrhoids.  She had some red blood on rectal exam.  Sample sent for guaiac.   [MB]    Clinical Course User Index [MB] Hayden Rasmussen, MD        Final Clinical Impressions(s) / ED Diagnoses   Final diagnoses:  Rectal bleeding    ED Discharge Orders    None       Hayden Rasmussen, MD 07/17/18 2344

## 2018-07-17 NOTE — Discharge Instructions (Addendum)
You were seen in the emergency department for concern for vaginal bleeding.  You had blood work that was unremarkable and along with a urinalysis.  You underwent a pelvic exam that did not show any bleeding from within the vagina but it looks like you possibly had had a episode of rectal bleeding.  This will need to be followed up with your doctor and you may need a colonoscopy.  Please return if any concerns.

## 2018-07-17 NOTE — ED Triage Notes (Signed)
Pt reports that she had cramping in her abdomen that started around 1715 today. Pt reports she then had some vaginal bleeding. Pt denies any nausea or vomiting. Pt reports that this has happened two times prior to this with the most recent episode prior to this one being last year. Pt reports she had a hysterectomy about 15 years ago.

## 2018-07-18 ENCOUNTER — Ambulatory Visit: Payer: BC Managed Care – PPO | Admitting: Women's Health

## 2018-07-18 ENCOUNTER — Encounter: Payer: Self-pay | Admitting: Women's Health

## 2018-07-18 VITALS — BP 132/80

## 2018-07-18 DIAGNOSIS — Z8742 Personal history of other diseases of the female genital tract: Secondary | ICD-10-CM | POA: Diagnosis not present

## 2018-07-18 NOTE — Progress Notes (Signed)
68 year old BF G3 P3 presents with questionable vaginal bleeding.  Was seen in the ER yesterday after seeing a large amount of bright red blood in the commode and was instructed to follow-up with GYN.  States has noted no blood today.  Denies urinary symptoms, vaginal discharge, back pain or fever.  States has mild suprapubic pressure only.  2004 TAH with BSO for fibroids and menorrhagia.  Did bring her discharge papers that were reviewed.  No vaginal bleeding noted, new medications started 1 week ago Trulance 3 mg for constipation per GI.  Also on a baby aspirin which was not listed on her med list.  ER physician felt it was more rectal bleeding.  Last colonoscopy 2017 Dr. Collene Mares.  Medical problems include diabetes, hypertension, GERD, anxiety/depression.  Exam: Appears well.  No CVAT.  Abdomen obese, nontender, no rebound or radiation.  Speculum exam vaginal atrophy with no visible discharge, erythema, blood.  Bimanual nontender.  Rectal exam nontender, no visible blood. Slight decrease hemoglobin at ER 11.82 weeks prior it was at 12, 1 year prior 13.  Probable rectal bleeding  Plan: Reassurance given regarding  hysterectomy with BSO status no visible blood noted most likely not GYN related.  Instructed to follow-up with Dr. Collene Mares.

## 2018-07-20 ENCOUNTER — Other Ambulatory Visit: Payer: Self-pay | Admitting: Internal Medicine

## 2018-07-20 ENCOUNTER — Ambulatory Visit
Admission: RE | Admit: 2018-07-20 | Discharge: 2018-07-20 | Disposition: A | Discharge status: Home / Self Care | Payer: BC Managed Care – PPO | Source: Ambulatory Visit | Attending: Internal Medicine | Admitting: Internal Medicine

## 2018-07-20 DIAGNOSIS — R109 Unspecified abdominal pain: Secondary | ICD-10-CM

## 2018-07-20 IMAGING — CR DG ABDOMEN 2V
2 series · 2 of 2 positions shown · non-contrast
Comparison: None

CLINICAL DATA: The right flank pain for 3 days.

EXAM:
ABDOMEN - 2 VIEW

[w abdomen upright]
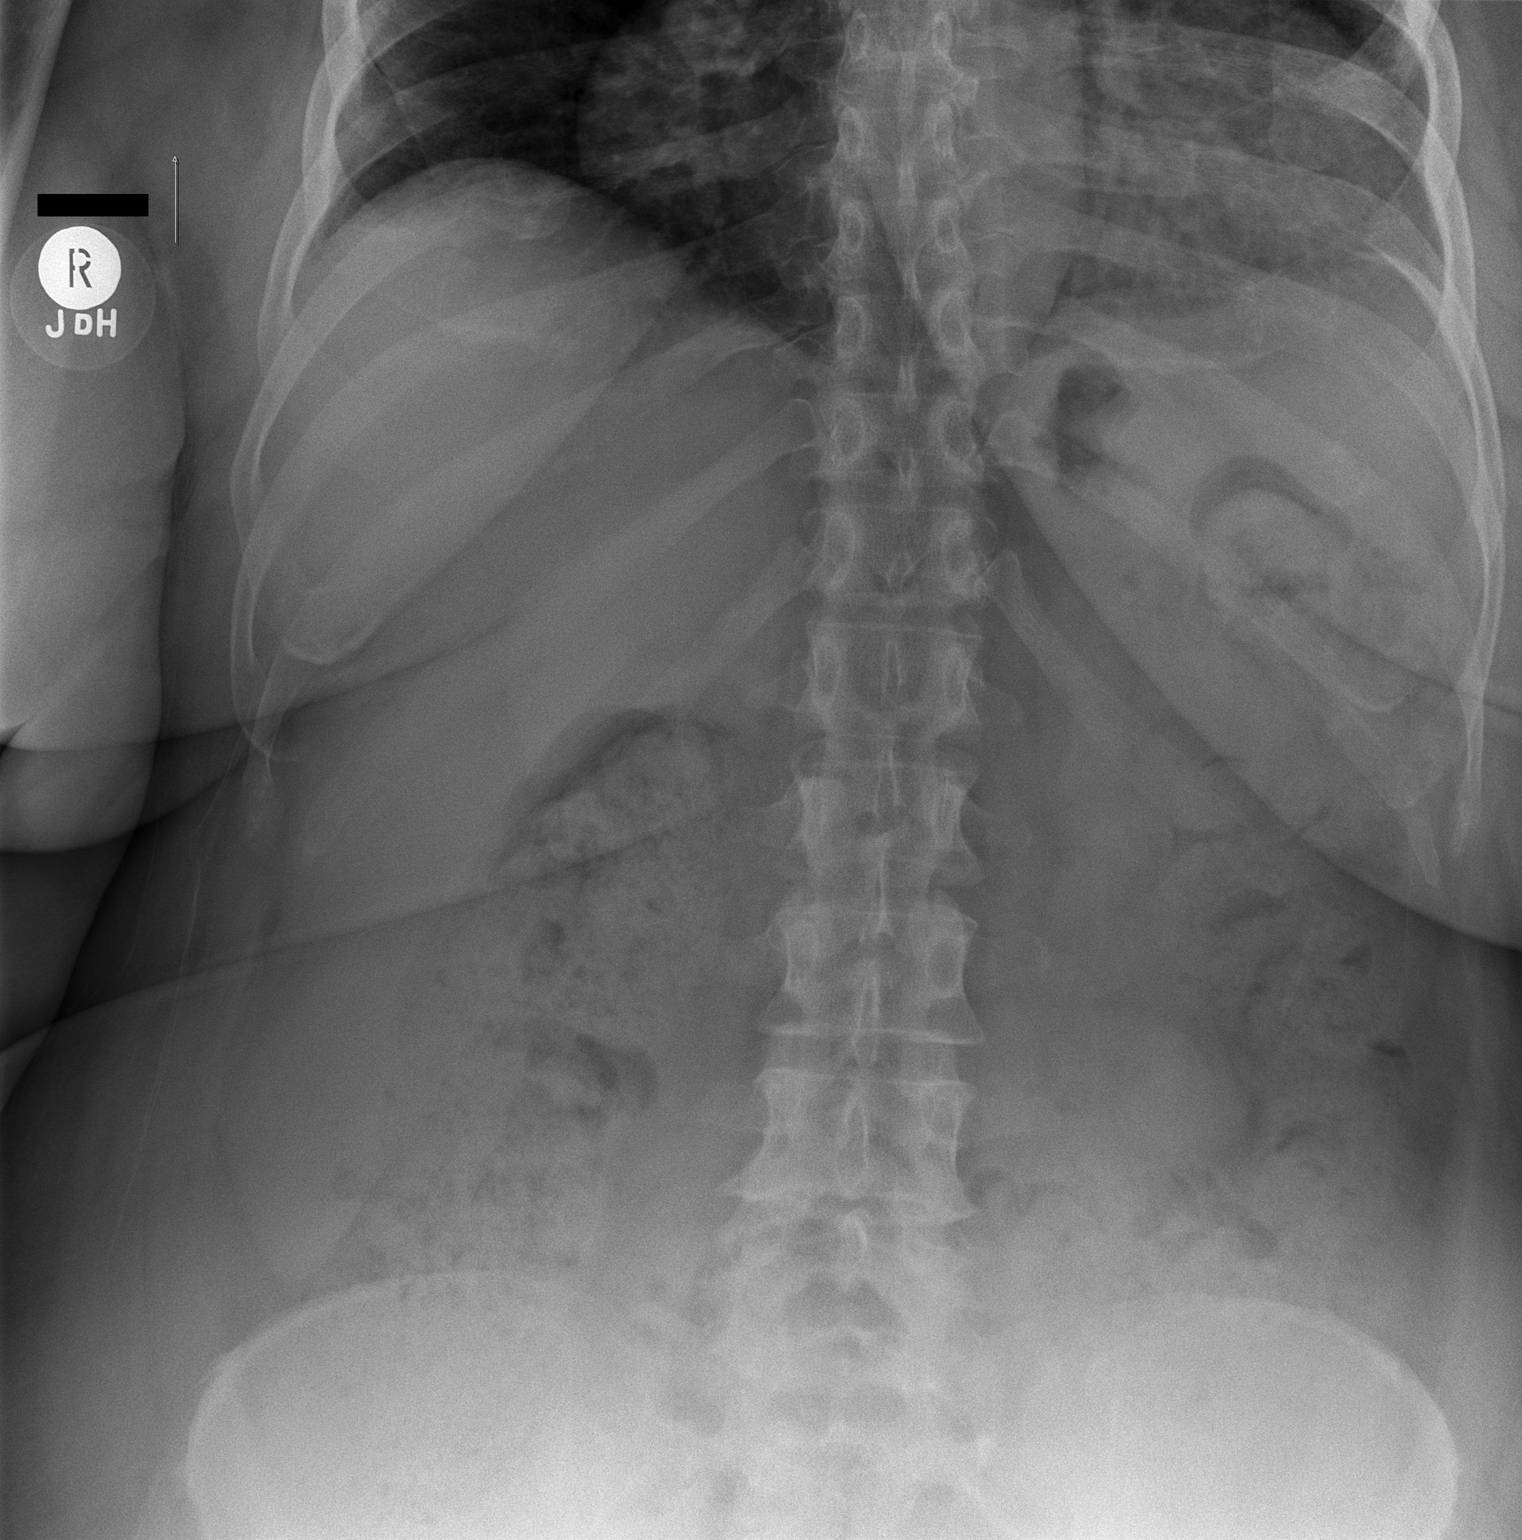

[t abdomen supine]
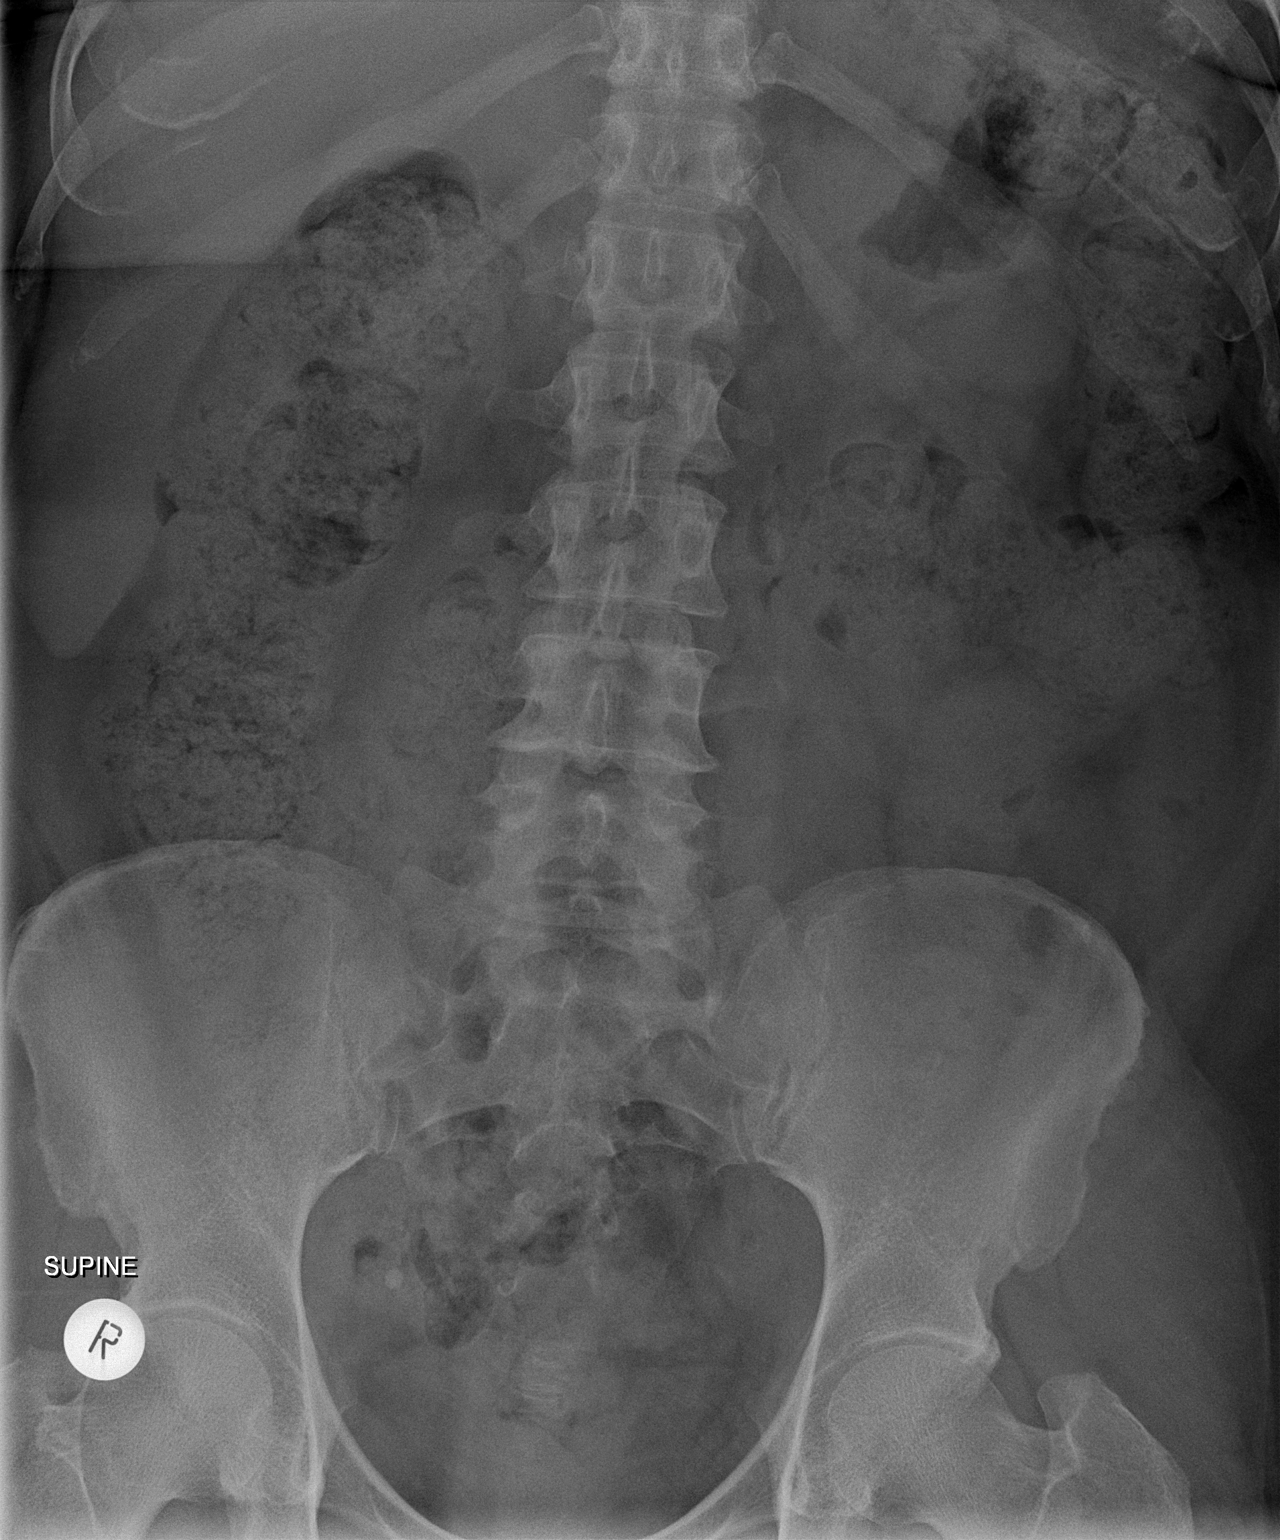

[2 of 2 positions shown; findings below may reference images not displayed]

FINDINGS: The bowel gas pattern is normal. There is a large stool burden noted
within the colon. There is no evidence of free air. No radio-opaque
calculi or other significant radiographic abnormality is seen.
IMPRESSION: 1. Nonobstructive bowel gas pattern.

## 2018-07-21 ENCOUNTER — Ambulatory Visit
Admission: RE | Admit: 2018-07-21 | Discharge: 2018-07-21 | Disposition: A | Discharge status: Home / Self Care | Payer: BC Managed Care – PPO | Source: Ambulatory Visit | Attending: Internal Medicine | Admitting: Internal Medicine

## 2018-07-21 DIAGNOSIS — R109 Unspecified abdominal pain: Secondary | ICD-10-CM

## 2018-07-22 ENCOUNTER — Other Ambulatory Visit: Payer: Self-pay | Admitting: Internal Medicine

## 2018-07-22 DIAGNOSIS — K869 Disease of pancreas, unspecified: Secondary | ICD-10-CM

## 2018-07-25 ENCOUNTER — Ambulatory Visit
Admission: RE | Admit: 2018-07-25 | Discharge: 2018-07-25 | Disposition: A | Discharge status: Home / Self Care | Payer: BC Managed Care – PPO | Source: Ambulatory Visit | Attending: Internal Medicine | Admitting: Internal Medicine

## 2018-07-25 DIAGNOSIS — K869 Disease of pancreas, unspecified: Secondary | ICD-10-CM

## 2018-07-25 IMAGING — CT CT ABDOMEN WITHOUT AND WITH CONTRAST
2 of 11 series · 11 of 46 positions shown, 16 images · IV contrast (iopamidol)
Comparison: None.

CLINICAL DATA: Indeterminate pancreatic lesion on ultrasound.
Ultrasound [DATE]

EXAM:
CT ABDOMEN WITHOUT AND WITH CONTRAST
TECHNIQUE: Multidetector CT imaging of the abdomen was performed following the
standard protocol before and following the bolus administration of
intravenous contrast.
CONTRAST:  125mL [4J] IOPAMIDOL ([4J]) INJECTION 61%

[Series 7: arterial phase 2.00 br36 s3 cor cor arterial · coronal · arterial · 0.51mm/px · 3 of 176 slices shown]
[im 44/176  soft-tissue]
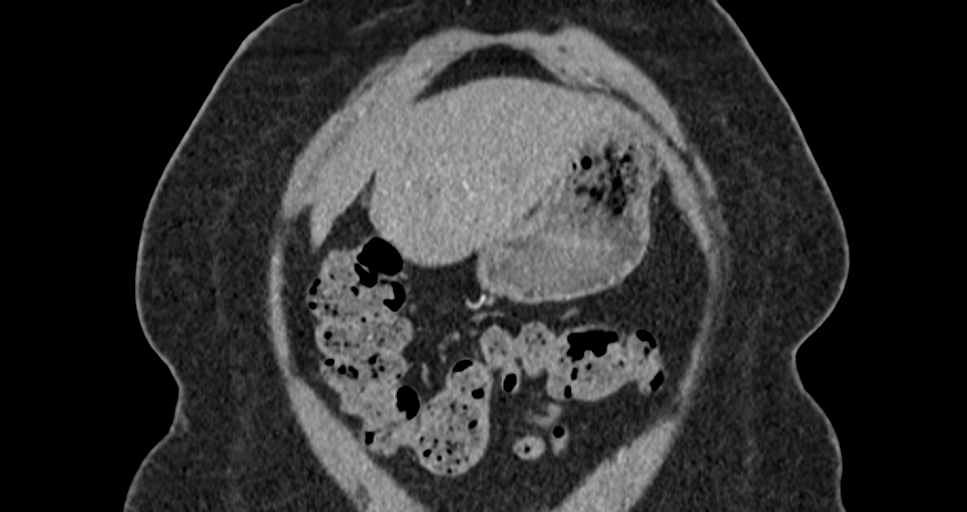
[im 88/176  soft-tissue]
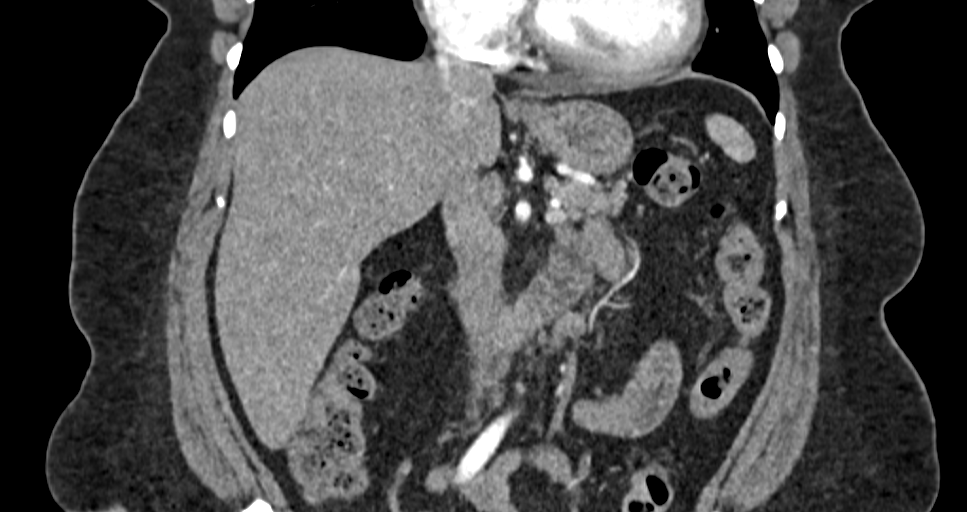
[im 132/176  soft-tissue]
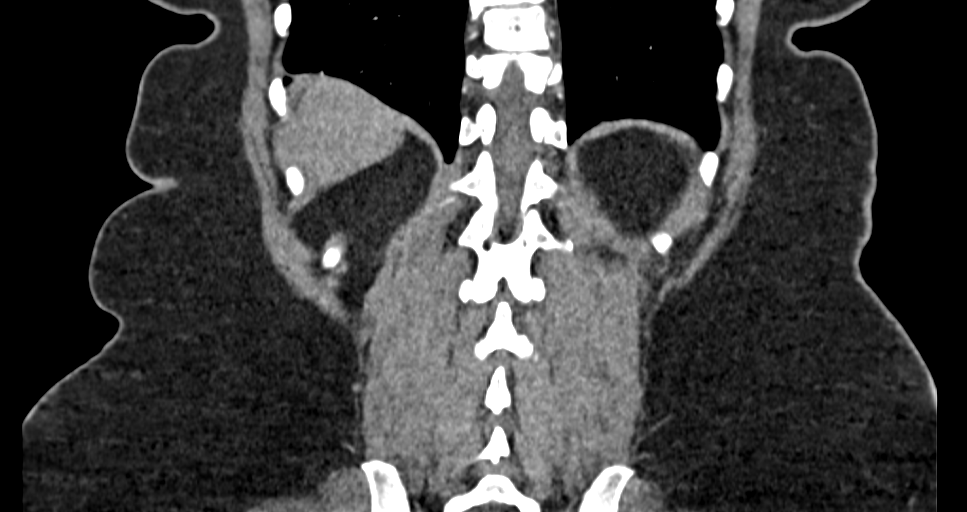

[Series 11: venous phase 2.00 br40 s3 pancreas venous · axial · portal-venous · 0.98mm/px · z∈[+1492,+1696]mm · 8 of 132 slices shown, 13 images]
[im 15/132  soft-tissue]
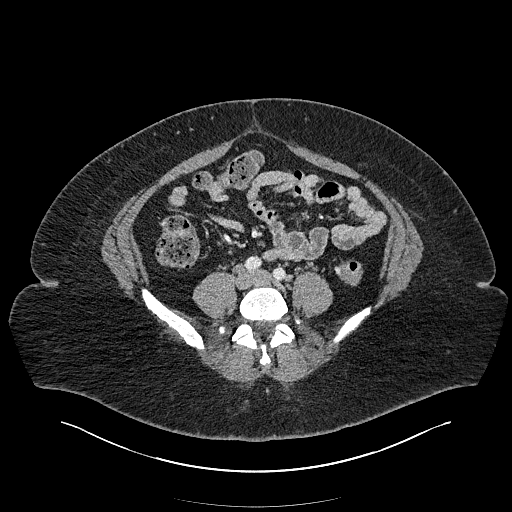
[im 15/132  bone]
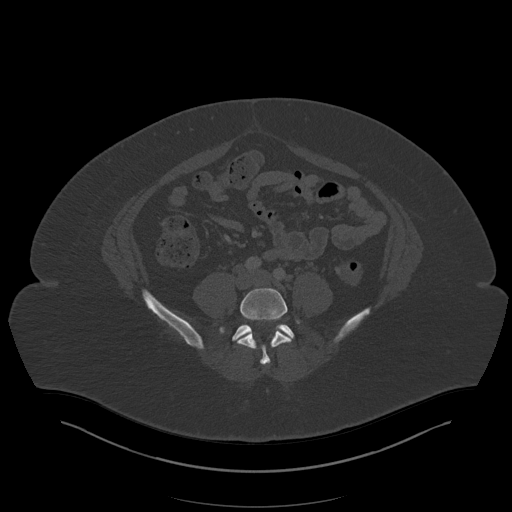
[im 30/132  soft-tissue]
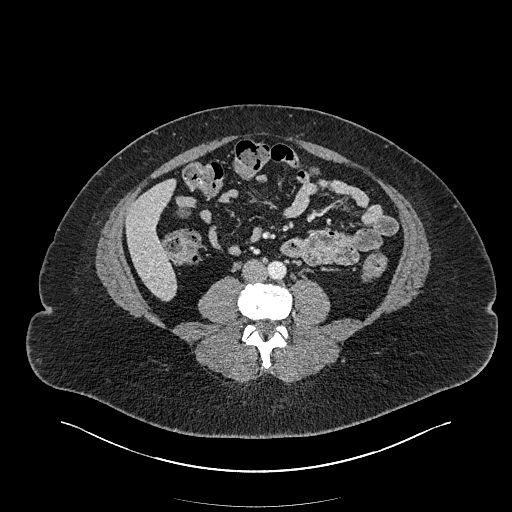
[im 44/132  soft-tissue]
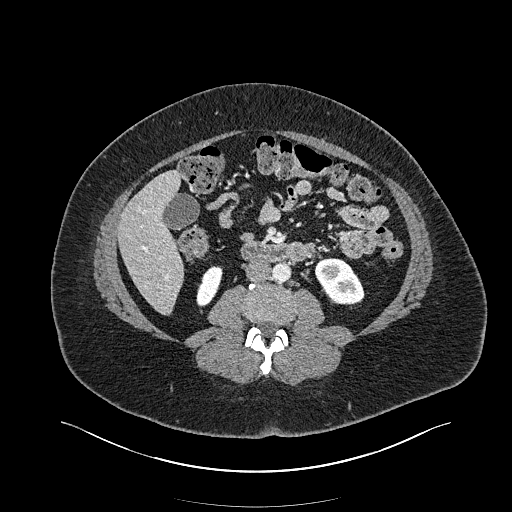
[im 59/132  soft-tissue]
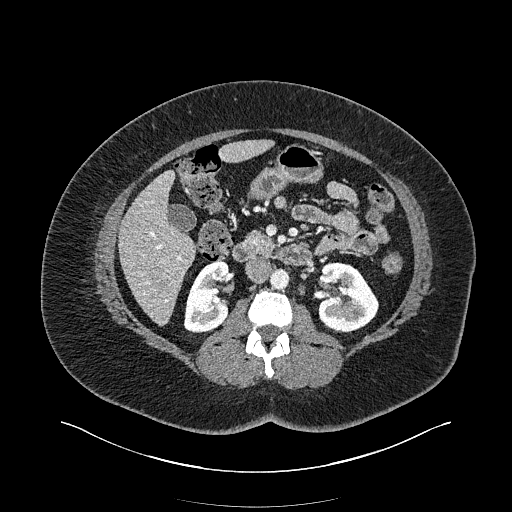
[im 73/132  soft-tissue]
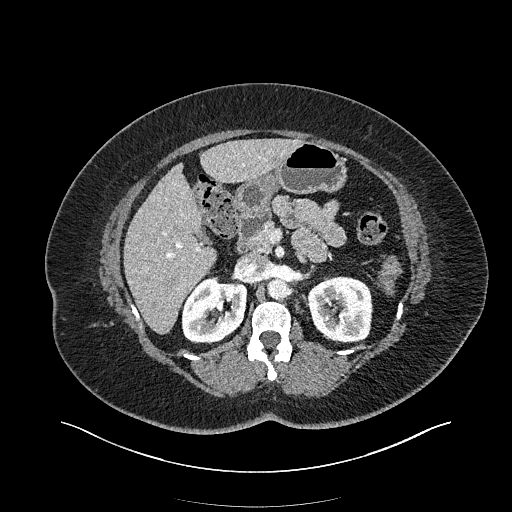
[im 73/132  lung]
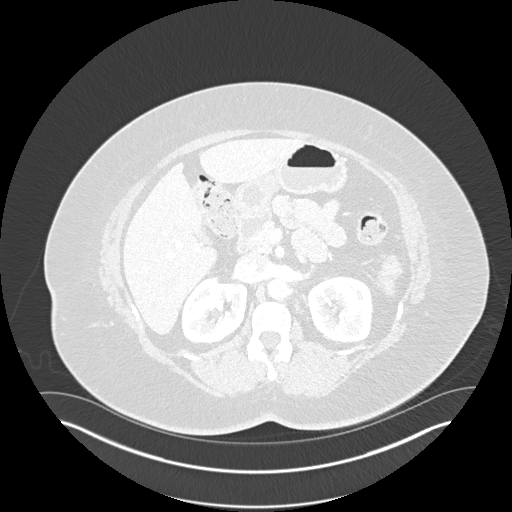
[im 88/132  soft-tissue]
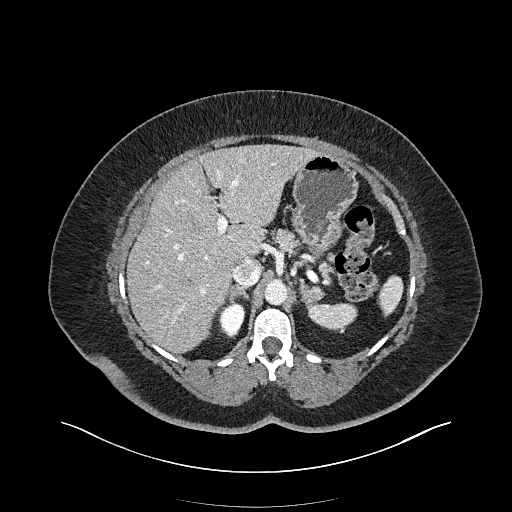
[im 88/132  lung]
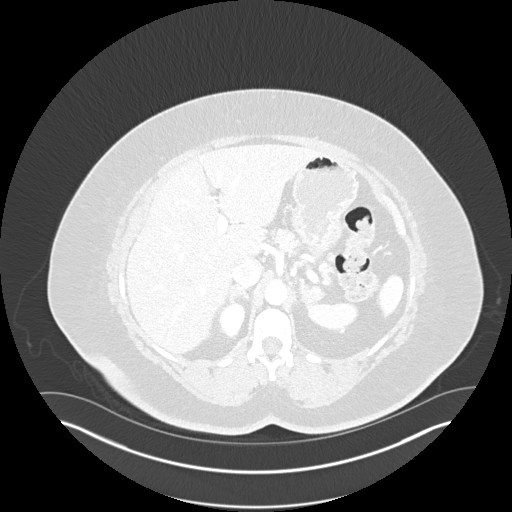
[im 102/132  soft-tissue]
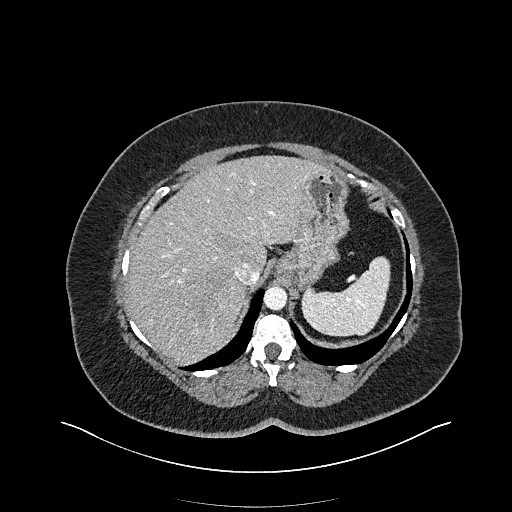
[im 102/132  lung]
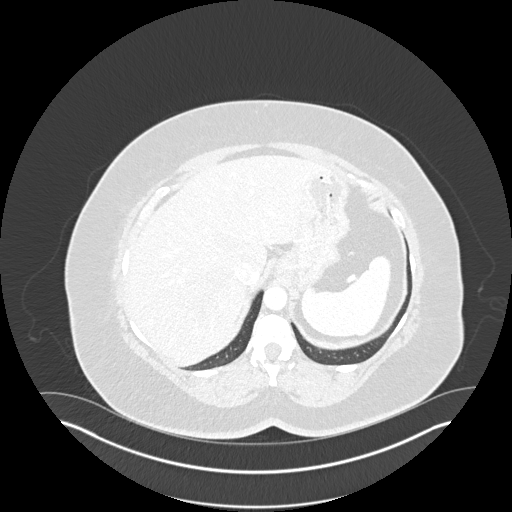
[im 117/132  soft-tissue]
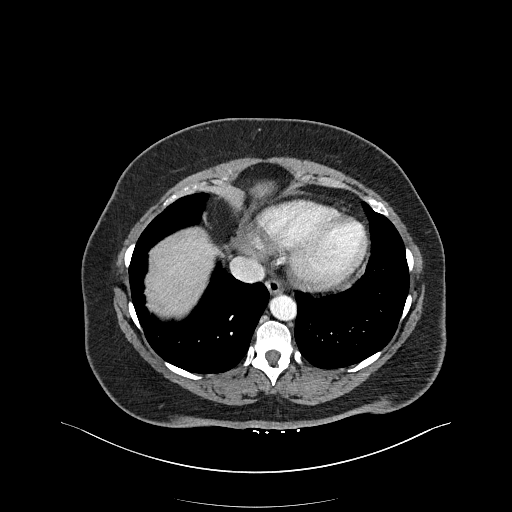
[im 117/132  lung]
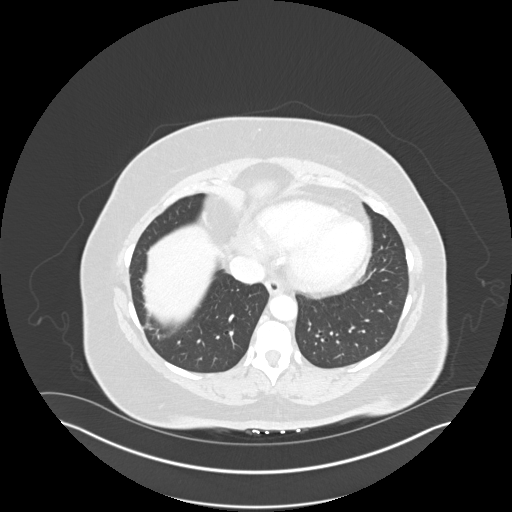

[11 of 46 positions shown; findings below may reference images not displayed]

FINDINGS: Lower chest:  Lung bases are clear.

Hepatobiliary: No focal hepatic lesion. No biliary duct dilatation.
Gallbladder normal. Common bile duct normal.

Pancreas: Pancreas is normal. There is no duct dilatation or
peripancreatic inflammation. No variant ductal anatomy.

There is no peripancreatic mass identified. No peripancreatic
lymphadenopathy.

Spleen: Normal spleen.

Adrenals/urinary tract: Rounded nodule of the LEFT adrenal gland
measures 14 mm. This nodule has contrast washout dynamics consistent
with a benign adenoma. Kidneys are normal.

Stomach/Bowel: Stomach, duodenum, and limited view of the small
bowel: Unremarkable.

Vascular/Lymphatic: Abdominal aortic normal caliber. No
retroperitoneal periportal lymphadenopathy.

Musculoskeletal: No aggressive osseous lesion
IMPRESSION: 1. No pancreatic lesion or peripancreatic lesion identified.
2. Abnormality described on comparison ultrasound is favored a
pseudo lesion presumably representing a loop of bowel.
3. No lymphadenopathy.
4. Benign LEFT adrenal adenoma.

## 2018-07-25 MED ORDER — IOPAMIDOL (ISOVUE-300) INJECTION 61%
125.0000 mL | Freq: Once | INTRAVENOUS | Status: AC | PRN
  Administered 2018-07-25: 125 mL via INTRAVENOUS

## 2018-08-03 ENCOUNTER — Encounter: Payer: Self-pay | Admitting: Obstetrics & Gynecology

## 2018-08-03 ENCOUNTER — Encounter: Payer: BC Managed Care – PPO | Admitting: Obstetrics & Gynecology

## 2018-08-03 ENCOUNTER — Ambulatory Visit: Payer: BC Managed Care – PPO | Admitting: Obstetrics & Gynecology

## 2018-08-03 ENCOUNTER — Other Ambulatory Visit: Payer: Self-pay

## 2018-08-03 VITALS — BP 116/70 | Ht 64.0 in | Wt 270.0 lb

## 2018-08-03 DIAGNOSIS — Z90710 Acquired absence of both cervix and uterus: Secondary | ICD-10-CM | POA: Diagnosis not present

## 2018-08-03 DIAGNOSIS — Z01419 Encounter for gynecological examination (general) (routine) without abnormal findings: Secondary | ICD-10-CM | POA: Diagnosis not present

## 2018-08-03 DIAGNOSIS — Z6841 Body Mass Index (BMI) 40.0 and over, adult: Secondary | ICD-10-CM

## 2018-08-03 DIAGNOSIS — Z90722 Acquired absence of ovaries, bilateral: Secondary | ICD-10-CM

## 2018-08-03 DIAGNOSIS — Z780 Asymptomatic menopausal state: Secondary | ICD-10-CM

## 2018-08-03 DIAGNOSIS — Z9079 Acquired absence of other genital organ(s): Secondary | ICD-10-CM

## 2018-08-03 NOTE — Progress Notes (Signed)
Bianca Medina 04-23-1951 [id number]   History:    68 y.o. G3P3L3  Single.  7 grand-children.  RP:  Established patient presenting for annual gyn exam   HPI: S/P TAH/BSO in 2004 for Fibroids.  Menopause, well on no hormone replacement therapy.  No pelvic pain.  Presented to the emergency room on July 17, 2018 with probably rectal bleeding.  Seen by Elon Alas here on July 18, 2018 who confirmed a normal vagina and vulva with no vaginal bleeding.  Seen by Dr. Collene Mares who is treating her constipation.  No rectal bleeding since then.  Will follow up with Dr. Collene Mares and repeat a colonoscopy as needed.  Urine normal.  Bowel movements currently normal.  Breast normal.  Body mass index 46.35.  Exercising intermittently.  Type 2 diabetes on metformin.  Health labs with family physician.   Past medical history,surgical history, family history and social history were all reviewed and documented in the EPIC chart.  Gynecologic History No LMP recorded. Patient has had a hysterectomy. Contraception: status post hysterectomy Last Pap: 07/2016.  Results were: Negative Last mammogram: 05/2018. Results were: Negative Bone Density: 05/2015 Normal.  Repeat BD at 6 yrs Colonoscopy: 2017  Obstetric History OB History  Gravida Para Term Preterm AB Living  3 3       3   SAB TAB Ectopic Multiple Live Births               # Outcome Date GA Lbr Len/2nd Weight Sex Delivery Anes PTL Lv  3 Para           2 Para           1 Para              ROS: A ROS was performed and pertinent positives and negatives are included in the history.  GENERAL: No fevers or chills. HEENT: No change in vision, no earache, sore throat or sinus congestion. NECK: No pain or stiffness. CARDIOVASCULAR: No chest pain or pressure. No palpitations. PULMONARY: No shortness of breath, cough or wheeze. GASTROINTESTINAL: No abdominal pain, nausea, vomiting or diarrhea, melena or bright red blood per rectum. GENITOURINARY: No urinary frequency,  urgency, hesitancy or dysuria. MUSCULOSKELETAL: No joint or muscle pain, no back pain, no recent trauma. DERMATOLOGIC: No rash, no itching, no lesions. ENDOCRINE: No polyuria, polydipsia, no heat or cold intolerance. No recent change in weight. HEMATOLOGICAL: No anemia or easy bruising or bleeding. NEUROLOGIC: No headache, seizures, numbness, tingling or weakness. PSYCHIATRIC: No depression, no loss of interest in normal activity or change in sleep pattern.     Exam:   BP 116/70 (Cuff Size: Large)   Ht 5\' 4"  (1.626 m)   Wt 270 lb (122.5 kg)   BMI 46.35 kg/m   Body mass index is 46.35 kg/m.  General appearance : Well developed well nourished female. No acute distress HEENT: Eyes: no retinal hemorrhage or exudates,  Neck supple, trachea midline, no carotid bruits, no thyroidmegaly Lungs: Clear to auscultation, no rhonchi or wheezes, or rib retractions  Heart: Regular rate and rhythm, no murmurs or gallops Breast:Examined in sitting and supine position were symmetrical in appearance, no palpable masses or tenderness,  no skin retraction, no nipple inversion, no nipple discharge, no skin discoloration, no axillary or supraclavicular lymphadenopathy Abdomen: no palpable masses or tenderness, no rebound or guarding Extremities: no edema or skin discoloration or tenderness  Pelvic: Vulva: Normal  Vagina: No gross lesions or discharge  Cervix/Uterus absent  Adnexa  Without masses or tenderness  Anus: Normal   Assessment/Plan:  68 y.o. female for annual exam   1. Well woman exam with routine gynecological exam Gynecologic exam status post TAH/BSO.  Pap test March 2018 was negative, no indication to repeat this year.  Breast exam normal.  Last screening mammogram January 2020 was negative.  Colonoscopy in 2017.  Health labs with family physician.  We will follow-up with Dr. Collene Mares given probable rectal bleeding in early March 2020.  2. S/P TAH-BSO  3. Postmenopausal Well on no  hormone replacement therapy.  Bone density in January 2017 was completely normal.  Continue with vitamin D supplements, calcium intake of 1.2 to 1.5 g/day and regular weightbearing physical activities.  We will repeat a bone density at 6 years.  4. Class 3 severe obesity due to excess calories with serious comorbidity and body mass index (BMI) of 40.0 to 44.9 in adult [facility]) Lower calorie/carb diet such as [employer] recommended.  Aerobic physical activities 5 times a week and weightlifting every 2 days.  Princess Bruins MD, 8:56 AM 08/03/2018

## 2018-08-03 NOTE — Patient Instructions (Signed)
1. Well woman exam with routine gynecological exam Gynecologic exam status post TAH/BSO.  Pap test March 2018 was negative, no indication to repeat this year.  Breast exam normal.  Last screening mammogram January 2020 was negative.  Colonoscopy in 2017.  Health labs with family physician.  We will follow-up with Dr. Collene Mares given probable rectal bleeding in early March 2020.  2. S/P TAH-BSO  3. Postmenopausal Well on no hormone replacement therapy.  Bone density in January 2017 was completely normal.  Continue with vitamin D supplements, calcium intake of 1.2 to 1.5 g/day and regular weightbearing physical activities.  We will repeat a bone density at 5 years.  4. Class 3 severe obesity due to excess calories with serious comorbidity and body mass index (BMI) of 40.0 to 44.9 in adult [facility]) Lower calorie/carb diet such as [employer] recommended.  Aerobic physical activities 5 times a week and weightlifting every 2 days.  Bianca Medina, it was a pleasure seeing you today!

## 2018-08-11 ENCOUNTER — Encounter: Payer: BC Managed Care – PPO | Admitting: Obstetrics & Gynecology

## 2018-09-24 ENCOUNTER — Other Ambulatory Visit: Payer: Self-pay | Admitting: Obstetrics & Gynecology

## 2018-10-17 ENCOUNTER — Other Ambulatory Visit: Payer: Self-pay | Admitting: Obstetrics & Gynecology

## 2018-10-17 NOTE — Telephone Encounter (Signed)
You prescribed this 01/10/2018 for #30 with no refills.  She returned for follow visit on 02/15/2018 and you wrote:  "HPI: Stopped Phentermine after a few days because it was actually increasing her appetite.  Seeing [facility], working on portions and lower Carbs.  Will start going to Gym now.  Prefers to proceed without medication."

## 2018-12-01 IMAGING — US US ABDOMEN COMPLETE
1 series · 13 of 25 positions shown · non-contrast
Comparison: None.

CLINICAL DATA: Patient with abdominal pain for 5 days.

EXAM:
ABDOMEN ULTRASOUND COMPLETE

[Series 1: us abdomen complete · 0.23mm/px · 13 of 95 slices shown]
[im 1/95]
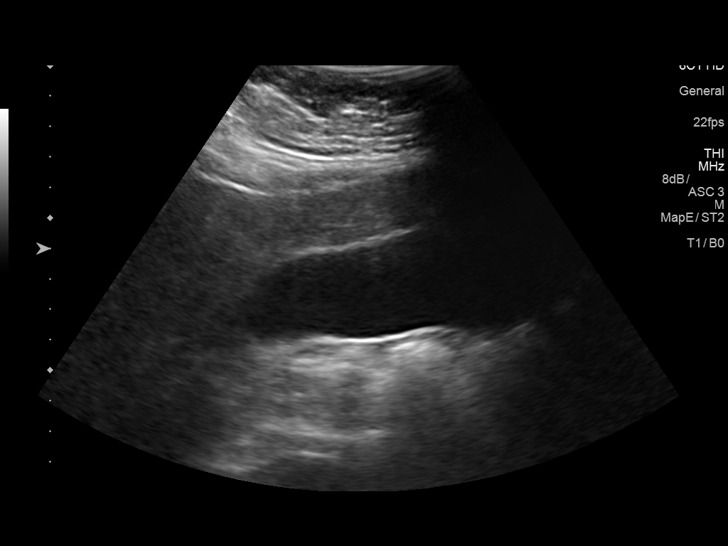
[im 8/95]
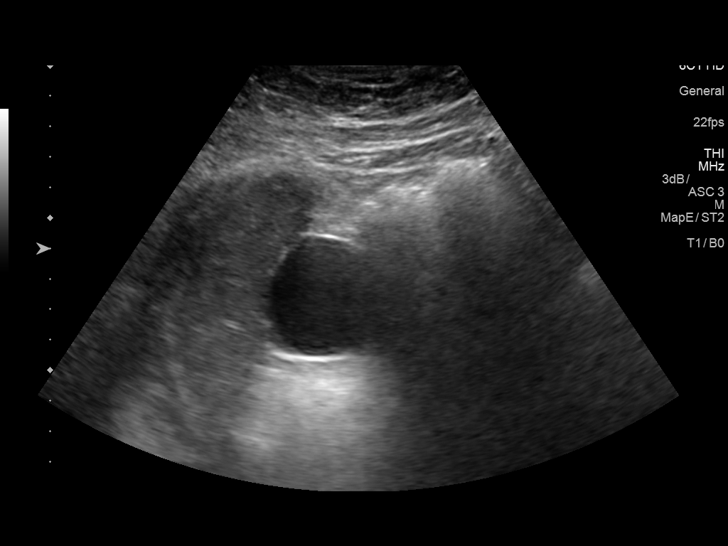
[im 16/95]
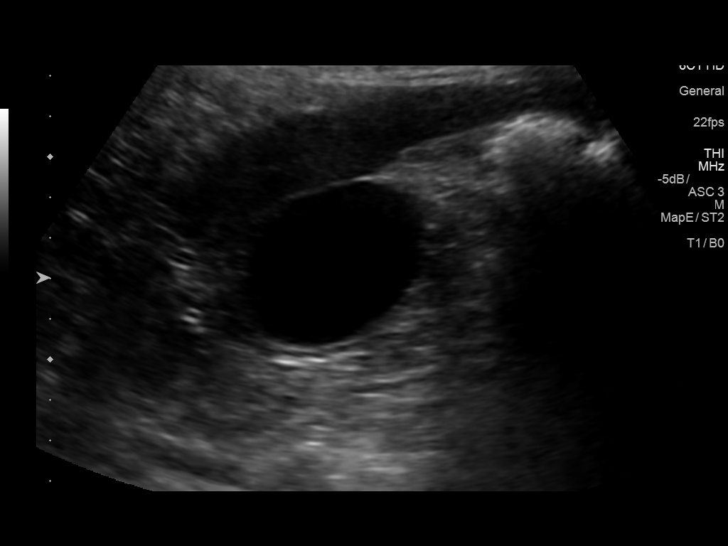
[im 24/95]
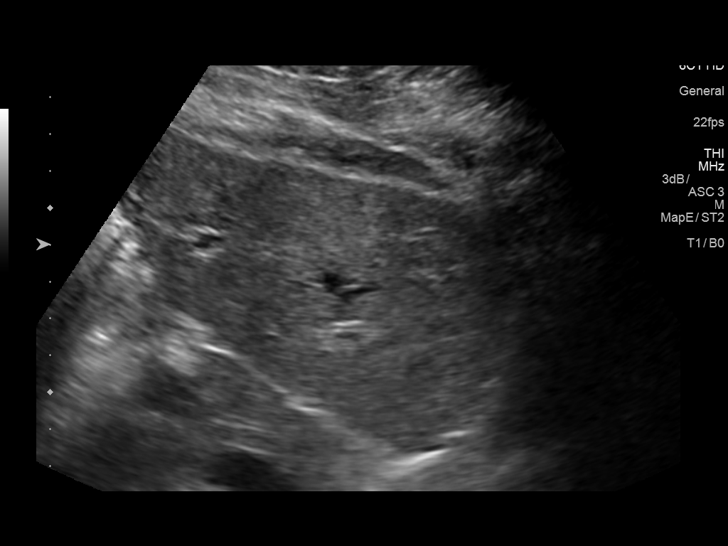
[im 32/95]
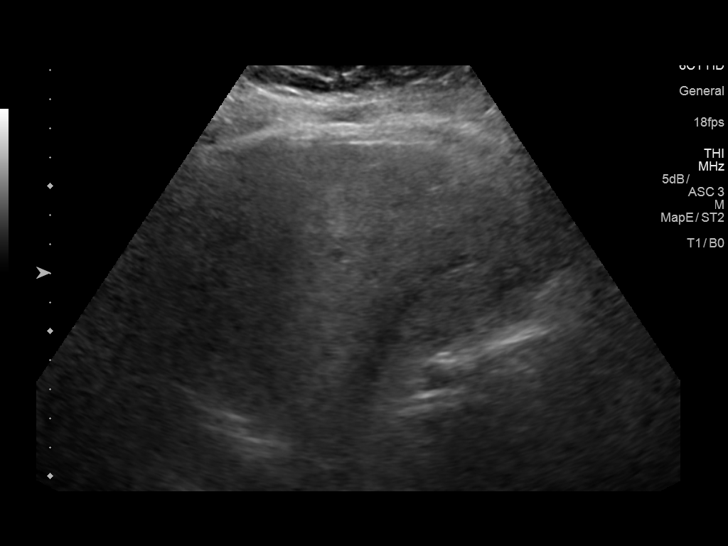
[im 40/95]
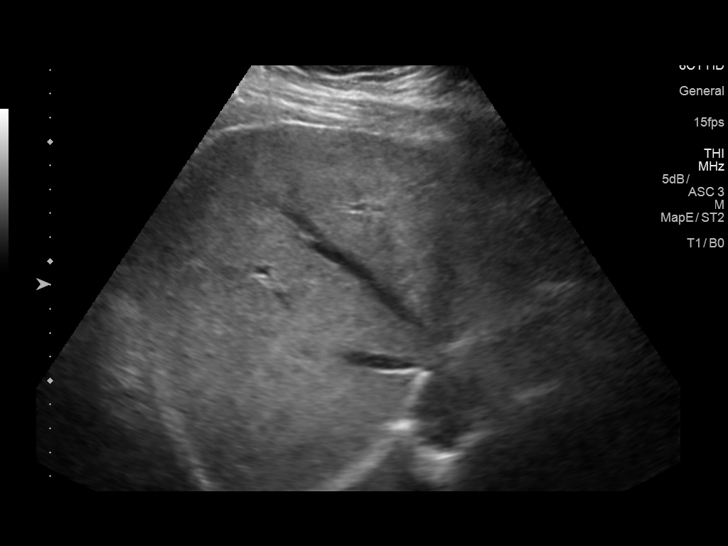
[im 48/95]
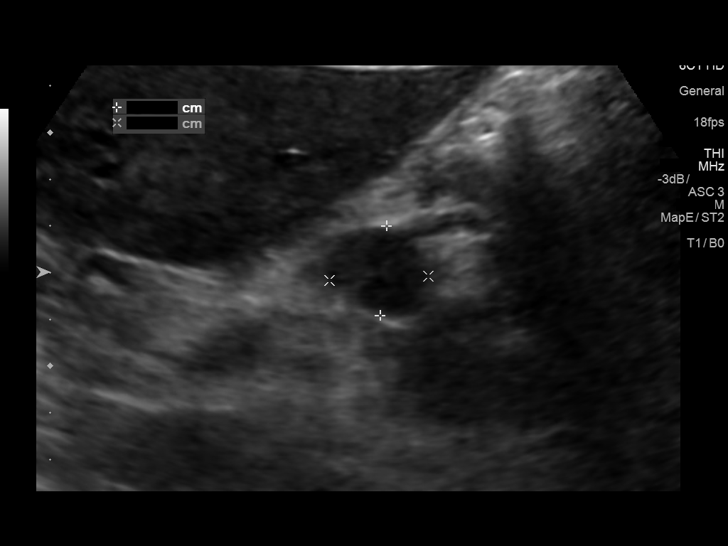
[im 55/95]
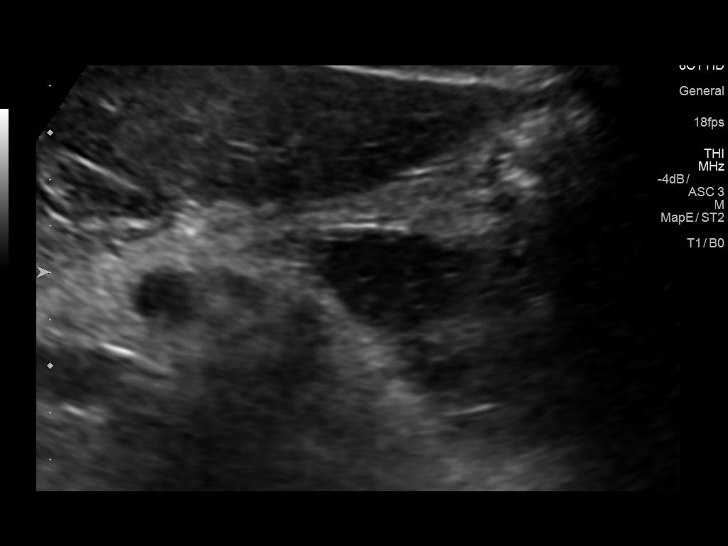
[im 63/95]
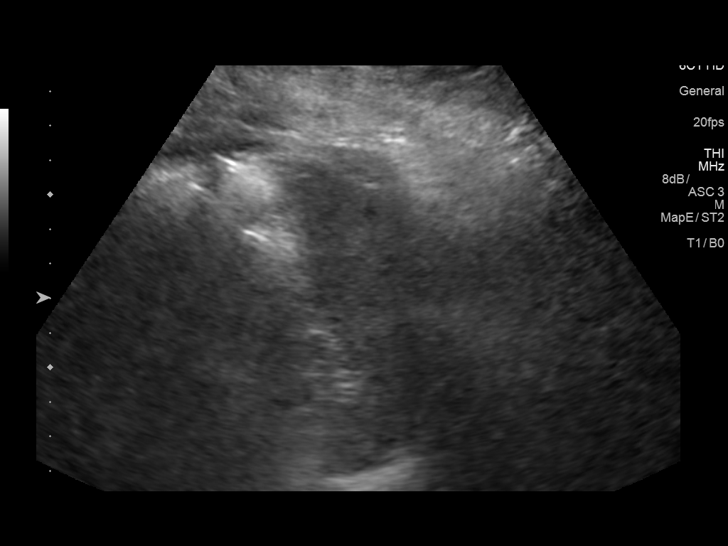
[im 71/95]
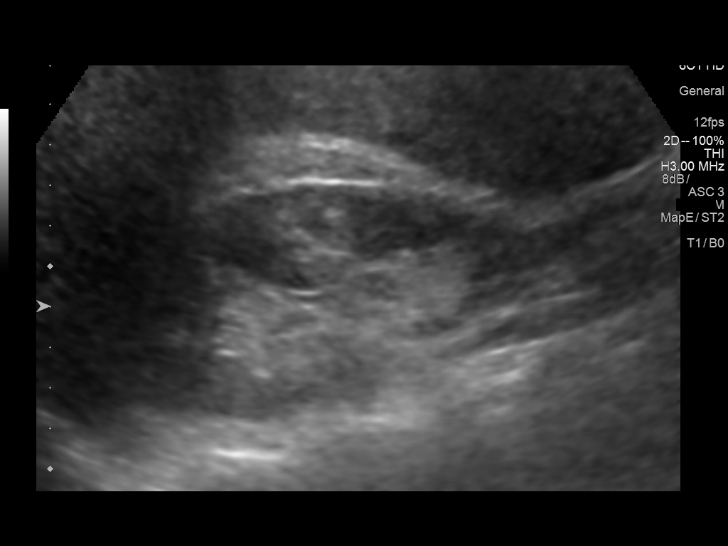
[im 79/95]
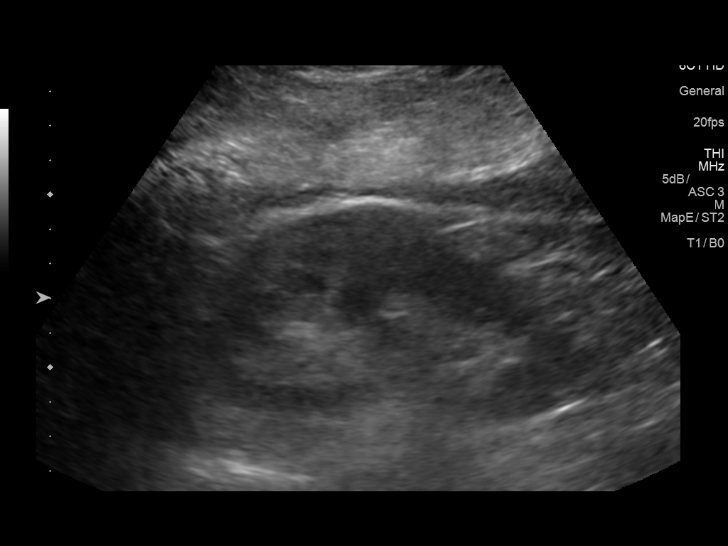
[im 87/95]
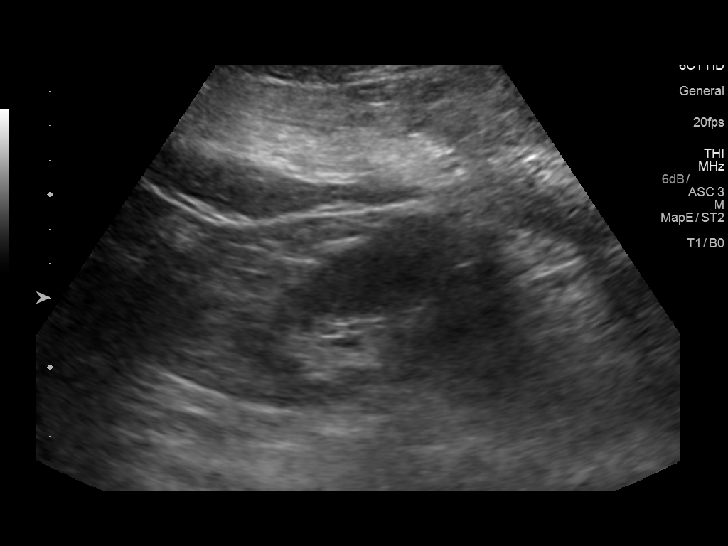
[im 95/95]
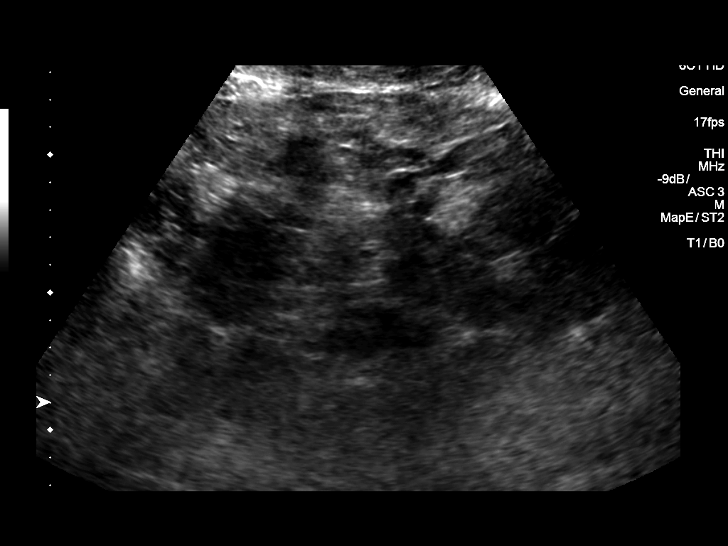

[13 of 25 positions shown; findings below may reference images not displayed]

FINDINGS: Gallbladder: No gallstones or wall thickening visualized. No
sonographic Murphy sign noted by sonographer.

Common bile duct: Diameter: 3 mm

Liver: No focal lesion identified. Within normal limits in
parenchymal echogenicity. Portal vein is patent on color Doppler
imaging with normal direction of blood flow towards the liver.

IVC: No abnormality visualized.

Pancreas: There is a 6 x 2 x 2 cm hypoechoic structure within the
region of the pancreas, indeterminate.

Spleen: Size and appearance within normal limits.

Right Kidney: Length: 10.4 cm. Echogenicity within normal limits. No
mass or hydronephrosis visualized.

Left Kidney: Length: 11.2 cm. Echogenicity within normal limits. No
mass or hydronephrosis visualized.

Abdominal aorta: No aneurysm visualized.

Other findings: None.
IMPRESSION: 1. There is an indeterminate 6 cm hypoechoic masslike structure
within the region of the pancreas. This needs dedicated evaluation
with pancreatic protocol CT or MRI.
2. No cholelithiasis or sonographic evidence for acute
cholecystitis.
3. These results will be called to the ordering clinician or
representative by the Radiologist Assistant, and communication
documented in the PACS or zVision Dashboard.

## 2019-02-13 ENCOUNTER — Encounter: Payer: Self-pay | Admitting: Obstetrics & Gynecology

## 2019-02-13 ENCOUNTER — Ambulatory Visit: Payer: BC Managed Care – PPO | Admitting: Obstetrics & Gynecology

## 2019-02-13 ENCOUNTER — Other Ambulatory Visit: Payer: Self-pay

## 2019-02-13 VITALS — BP 128/84

## 2019-02-13 DIAGNOSIS — N644 Mastodynia: Secondary | ICD-10-CM

## 2019-02-13 NOTE — Progress Notes (Signed)
    Bianca Medina 08-Apr-1951 [id number]        67 y.o.  G3P3L3  RP: Left breast pain x 3 days  HPI: Left breast pain for 3 days.  The pain is intermittent.  No lump felt.  No discharge from the nipple and no change of the skin.  Last screening mammogram January 2020 was negative.  No first-degree relative with breast cancer.   OB History  Gravida Para Term Preterm AB Living  3 3       3   SAB TAB Ectopic Multiple Live Births               # Outcome Date GA Lbr Len/2nd Weight Sex Delivery Anes PTL Lv  3 Para           2 Para           1 Para             Past medical history,surgical history, problem list, medications, allergies, family history and social history were all reviewed and documented in the EPIC chart.   Directed ROS with pertinent positives and negatives documented in the history of present illness/assessment and plan.  Exam:  Vitals:   02/13/19 1633  BP: 128/84   General appearance:  Normal  Breast exam:  Right breast normal                                   Left breast tenderness and increased density at 2 O'Clock midway between nipple and lateral aspect of the breast   Assessment/Plan:  68 y.o. G3P3   1. Pain of left breast Intermittent pain in the left breast.  On breast exam today the left breast is tender with increased density at 2:00 midway between the nipple and the lateral aspect of the breast.  Left Dx mammo/US.  Counseling on above issues and coordination of care more than 50% for 15 minutes.  Princess Bruins MD, 4:48 PM 02/13/2019

## 2019-02-14 ENCOUNTER — Telehealth: Payer: Self-pay | Admitting: *Deleted

## 2019-02-14 DIAGNOSIS — N644 Mastodynia: Secondary | ICD-10-CM

## 2019-02-14 NOTE — Telephone Encounter (Signed)
-----   Message from Ramond Craver, [place] sent at 02/13/2019  4:56 PM EDT ----- Regarding: FW: Refer for Left Dx mammo/US  ----- Message ----- From: Princess Bruins, MD Sent: 02/13/2019   4:54 PM EDT To: Ramond Craver, RMA Subject: Refer for Left Dx mammo/US                     Left breast sharp pain x 2 in last 3 days.  Left breast exam:  Tenderness with dense area at 2 O'Clock midway between nipple and most lateral aspect of the breast.

## 2019-02-14 NOTE — Telephone Encounter (Signed)
Patient scheduled at breast center on 02/22/19 @ 2:00pm. Patient informed.

## 2019-02-16 ENCOUNTER — Encounter: Payer: Self-pay | Admitting: Obstetrics & Gynecology

## 2019-02-16 NOTE — Patient Instructions (Signed)
1. Pain of left breast Intermittent pain in the left breast.  On breast exam today the left breast is tender with increased density at 2:00 midway between the nipple and the lateral aspect of the breast.  Left Dx mammo/US.  Bianca Medina, it was a pleasure seeing you today!

## 2019-02-20 ENCOUNTER — Ambulatory Visit (INDEPENDENT_AMBULATORY_CARE_PROVIDER_SITE_OTHER): Payer: BC Managed Care – PPO | Admitting: Anesthesiology

## 2019-02-20 ENCOUNTER — Other Ambulatory Visit: Payer: Self-pay

## 2019-02-20 DIAGNOSIS — Z23 Encounter for immunization: Secondary | ICD-10-CM

## 2019-02-22 ENCOUNTER — Ambulatory Visit: Payer: BC Managed Care – PPO

## 2019-02-22 ENCOUNTER — Ambulatory Visit
Admission: RE | Admit: 2019-02-22 | Discharge: 2019-02-22 | Disposition: A | Discharge status: Home / Self Care | Payer: BC Managed Care – PPO | Source: Ambulatory Visit | Attending: Obstetrics & Gynecology | Admitting: Obstetrics & Gynecology

## 2019-02-22 ENCOUNTER — Other Ambulatory Visit: Payer: Self-pay

## 2019-02-22 ENCOUNTER — Encounter: Payer: Self-pay | Admitting: Gynecology

## 2019-02-22 DIAGNOSIS — N644 Mastodynia: Secondary | ICD-10-CM

## 2019-02-22 IMAGING — MG MM DIGITAL DIAGNOSTIC UNILAT*L* W/ TOMO W/ CAD
4 series · 4 of 12 positions shown · non-contrast
Comparison: Previous exam(s).
COMPARISON: Previous exam(s).

Addendum:
CLINICAL DATA: Patient presents with two recent episodes of sharp
diffuse left shooting pains. No palpable abnormality.

EXAM:
DIGITAL DIAGNOSTIC UNILATERAL LEFT MAMMOGRAM WITH CAD AND TOMO

[L MLO synth-2D]
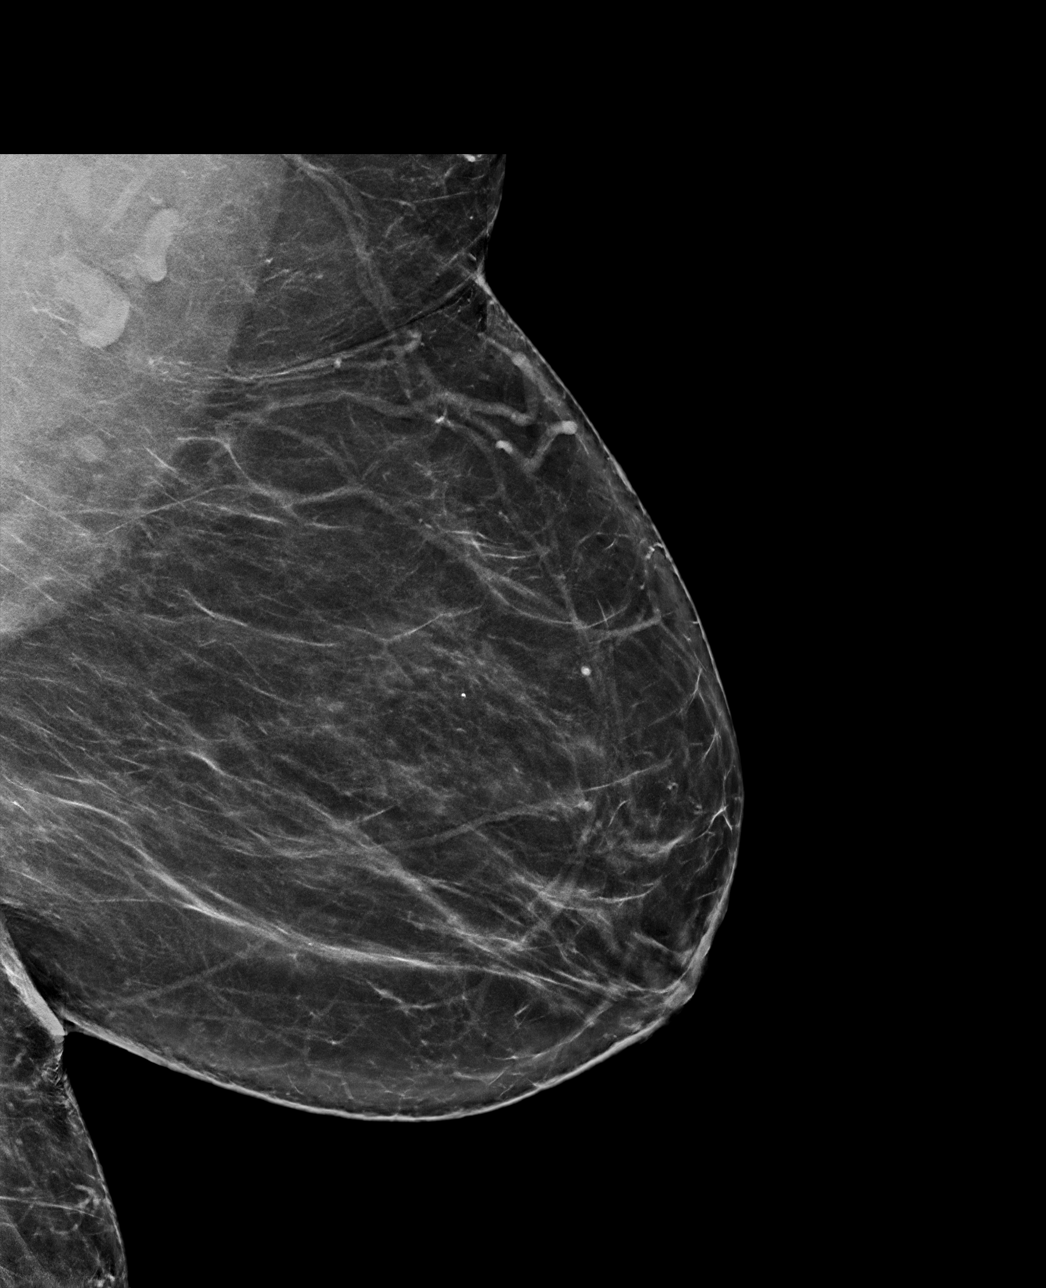

[L CC synth-2D]
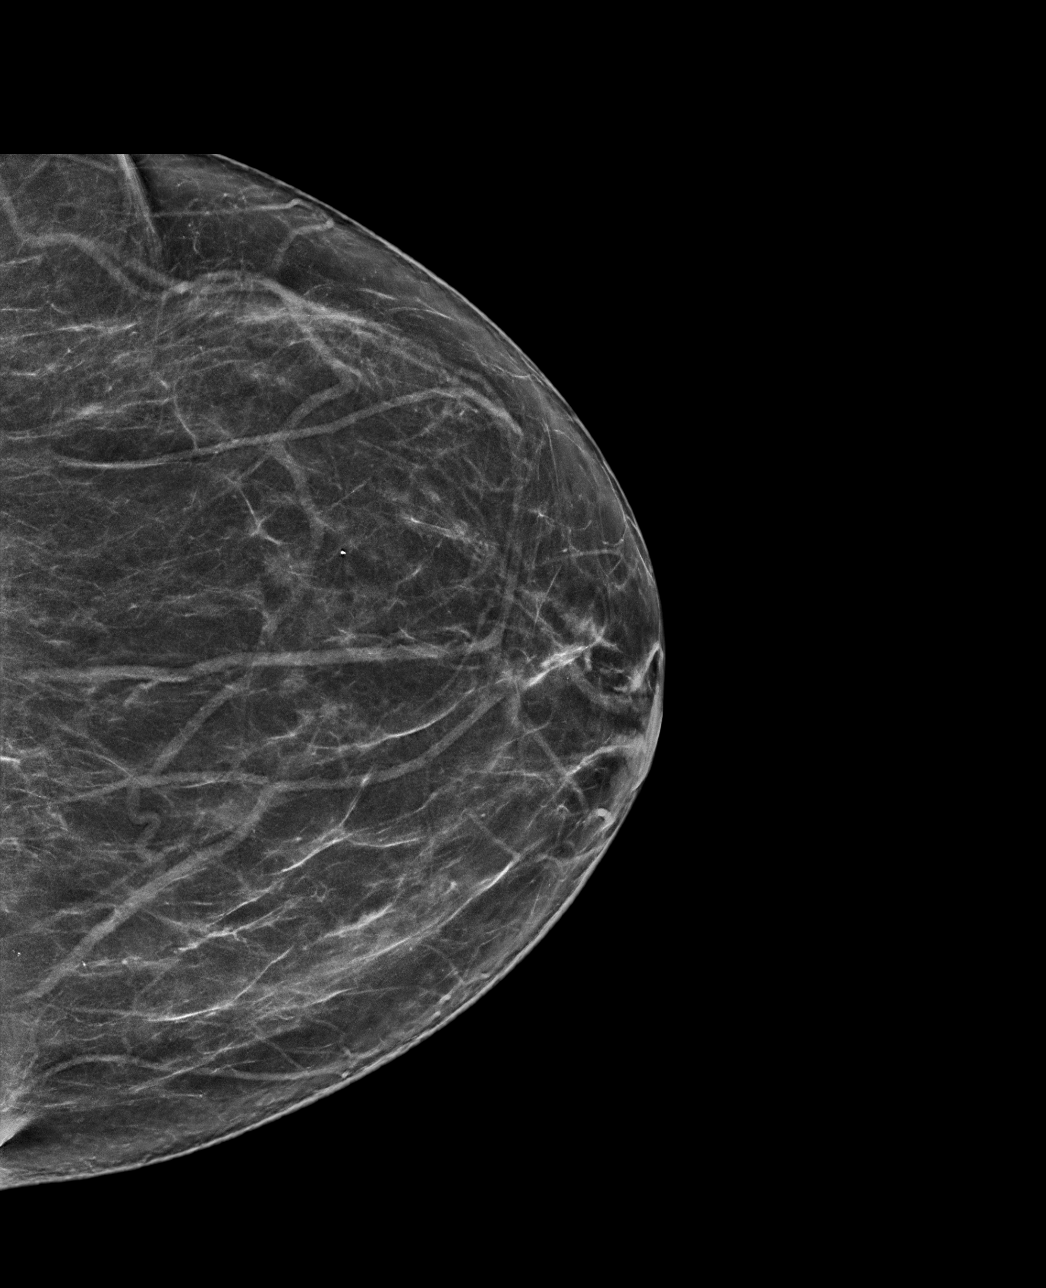

[L CC tomo · tomo slice 37/72.0]
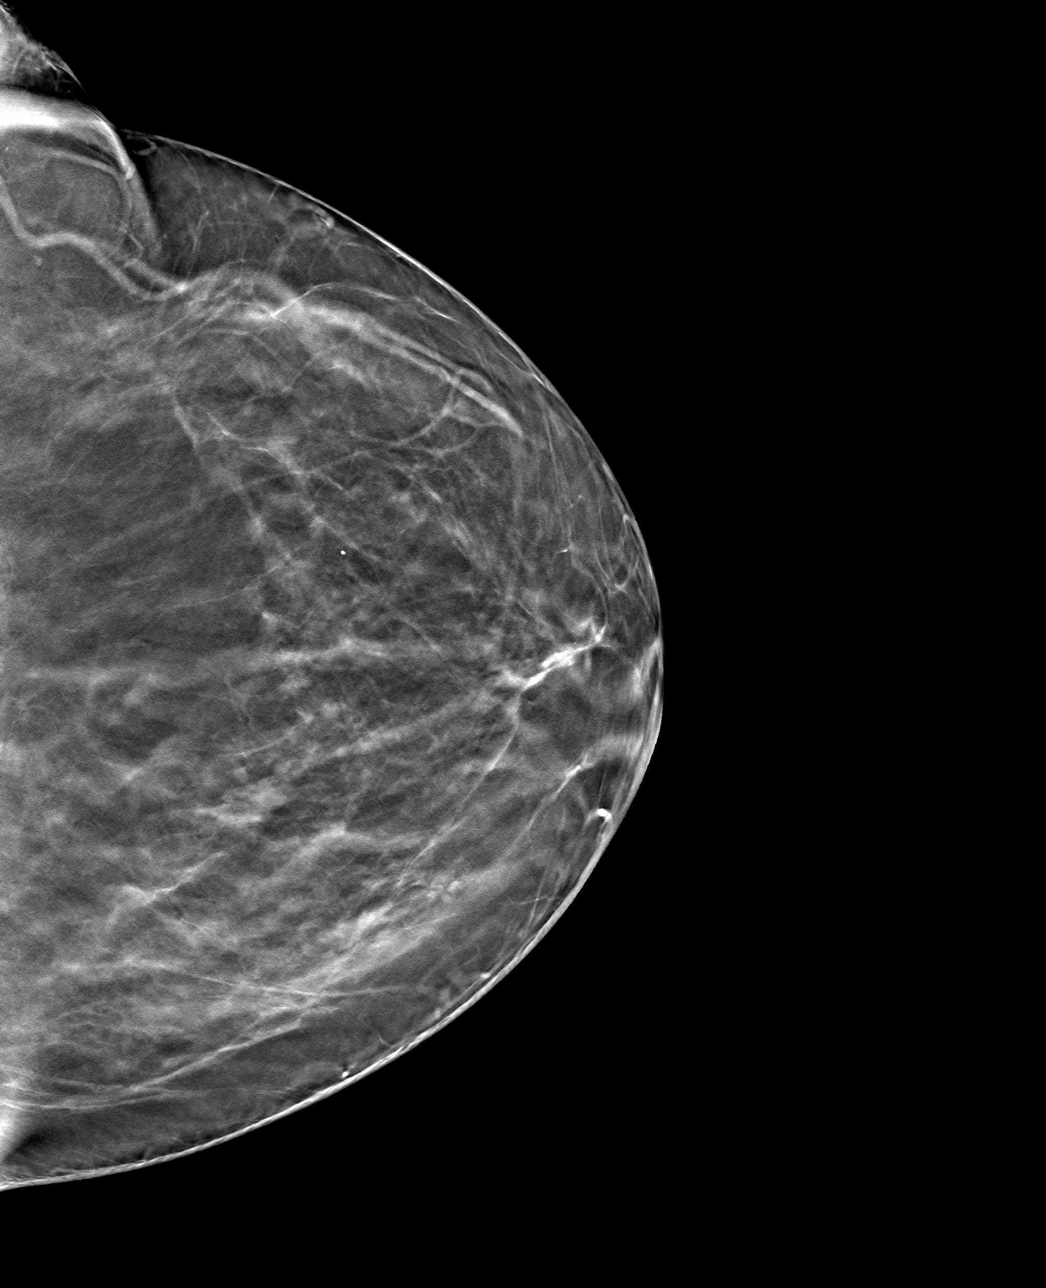

[L MLO tomo · tomo slice 42/83.0]
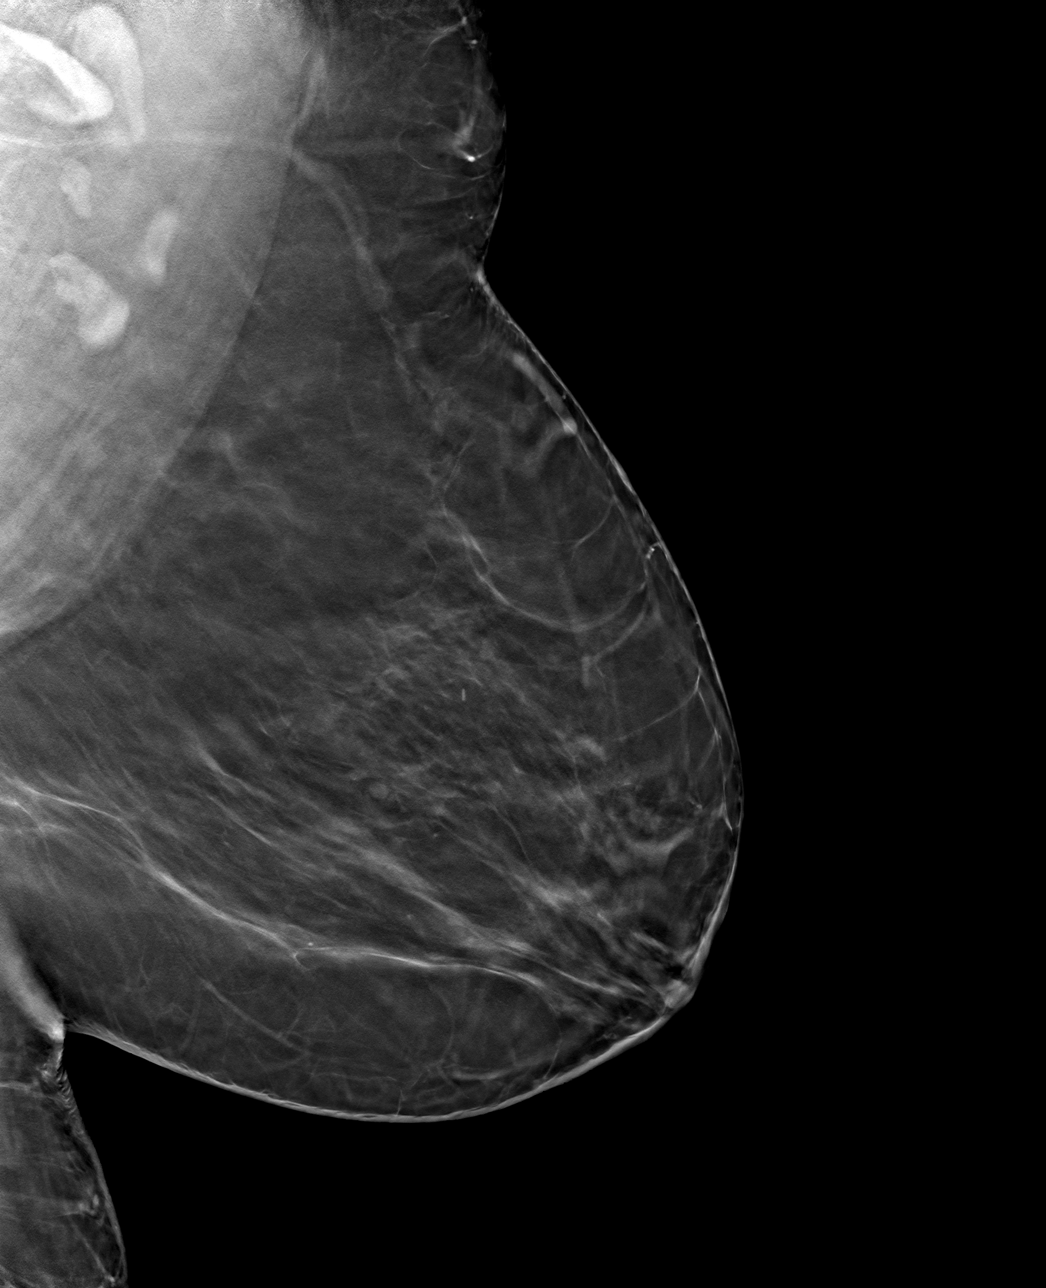

[4 of 12 positions shown; findings below may reference images not displayed]

ACR Breast Density Category b: There are scattered areas of
fibroglandular density.
FINDINGS: No suspicious mass, distortion, or microcalcifications are
identified to suggest presence of malignancy in the left breast.

Mammographic images were processed with CAD.
IMPRESSION: No mammographic evidence of malignancy in the left breast or other
finding to explain the patient's left breast pain.

RECOMMENDATION:
Clinical follow-up as needed for the left breast pain.

We discussed some of the issues regarding breast pain, including
limiting caffeine, wearing adequate support, over-the-counter pain
medication, low-fat diet, exercise, and ice as needed.

BI-RADS CATEGORY  1: Negative.

ADDENDUM:
Recommendation:

1.  Clinical follow-up as needed for the left breast pain.

2. Return to annual screening mammography which will be due in
[DATE].

*** End of Addendum ***
ACR Breast Density Category b: There are scattered areas of
fibroglandular density.
FINDINGS: No suspicious mass, distortion, or microcalcifications are
identified to suggest presence of malignancy in the left breast.

Mammographic images were processed with CAD.
IMPRESSION: No mammographic evidence of malignancy in the left breast or other
finding to explain the patient's left breast pain.

RECOMMENDATION:
Clinical follow-up as needed for the left breast pain.

We discussed some of the issues regarding breast pain, including
limiting caffeine, wearing adequate support, over-the-counter pain
medication, low-fat diet, exercise, and ice as needed.

BI-RADS CATEGORY  1: Negative.

## 2019-03-22 ENCOUNTER — Emergency Department (HOSPITAL_COMMUNITY)
Admission: EM | Admit: 2019-03-22 | Discharge: 2019-03-22 | Disposition: A | Discharge status: Home / Self Care | Payer: BC Managed Care – PPO | Attending: Emergency Medicine | Admitting: Emergency Medicine

## 2019-03-22 ENCOUNTER — Other Ambulatory Visit: Payer: Self-pay

## 2019-03-22 DIAGNOSIS — J45909 Unspecified asthma, uncomplicated: Secondary | ICD-10-CM | POA: Diagnosis not present

## 2019-03-22 DIAGNOSIS — Z7984 Long term (current) use of oral hypoglycemic drugs: Secondary | ICD-10-CM | POA: Insufficient documentation

## 2019-03-22 DIAGNOSIS — Z79899 Other long term (current) drug therapy: Secondary | ICD-10-CM | POA: Insufficient documentation

## 2019-03-22 DIAGNOSIS — I10 Essential (primary) hypertension: Secondary | ICD-10-CM | POA: Diagnosis not present

## 2019-03-22 DIAGNOSIS — R5383 Other fatigue: Secondary | ICD-10-CM

## 2019-03-22 DIAGNOSIS — E119 Type 2 diabetes mellitus without complications: Secondary | ICD-10-CM | POA: Diagnosis not present

## 2019-03-22 LAB — URINALYSIS, ROUTINE W REFLEX MICROSCOPIC
Bilirubin Urine: NEGATIVE
Glucose, UA: NEGATIVE mg/dL
Hgb urine dipstick: NEGATIVE
Ketones, ur: NEGATIVE mg/dL
Leukocytes,Ua: NEGATIVE
Nitrite: NEGATIVE
Protein, ur: NEGATIVE mg/dL
Specific Gravity, Urine: 1.02 (ref 1.005–1.030)
pH: 7 (ref 5.0–8.0)

## 2019-03-22 LAB — COMPREHENSIVE METABOLIC PANEL
ALT: 18 U/L (ref 0–44)
AST: 18 U/L (ref 15–41)
Albumin: 3.3 g/dL — ABNORMAL LOW (ref 3.5–5.0)
Alkaline Phosphatase: 85 U/L (ref 38–126)
Anion gap: 9 (ref 5–15)
BUN: 16 mg/dL (ref 8–23)
CO2: 29 mmol/L (ref 22–32)
Calcium: 8.9 mg/dL (ref 8.9–10.3)
Chloride: 101 mmol/L (ref 98–111)
Creatinine, Ser: 0.79 mg/dL (ref 0.44–1.00)
GFR calc Af Amer: >60 mL/min (ref >60)
GFR calc non Af Amer: >60 mL/min (ref >60)
Glucose, Bld: 101 mg/dL — ABNORMAL HIGH (ref 70–99)
Potassium: 3.3 mmol/L — ABNORMAL LOW (ref 3.5–5.1)
Sodium: 139 mmol/L (ref 135–145)
Total Bilirubin: 0.6 mg/dL (ref 0.3–1.2)
Total Protein: 7 g/dL (ref 6.5–8.1)

## 2019-03-22 LAB — CBC WITH DIFFERENTIAL/PLATELET
Abs Immature Granulocytes: 0.06 10*3/uL (ref 0.00–0.07)
Basophils Absolute: 0 10*3/uL (ref 0.0–0.1)
Basophils Relative: 0 %
Eosinophils Absolute: 0.2 10*3/uL (ref 0.0–0.5)
Eosinophils Relative: 2 %
HCT: 37.8 % (ref 36.0–46.0)
Hemoglobin: 12.5 g/dL (ref 12.0–15.0)
Immature Granulocytes: 1 %
Lymphocytes Relative: 26 %
Lymphs Abs: 3.1 10*3/uL (ref 0.7–4.0)
MCH: 25 pg — ABNORMAL LOW (ref 26.0–34.0)
MCHC: 33.1 g/dL (ref 30.0–36.0)
MCV: 75.4 fL — ABNORMAL LOW (ref 80.0–100.0)
Monocytes Absolute: 0.8 10*3/uL (ref 0.1–1.0)
Monocytes Relative: 7 %
Neutro Abs: 8 10*3/uL — ABNORMAL HIGH (ref 1.7–7.7)
Neutrophils Relative %: 64 %
Platelets: 289 10*3/uL (ref 150–400)
RBC: 5.01 MIL/uL (ref 3.87–5.11)
RDW: 14.4 % (ref 11.5–15.5)
WBC: 12.1 10*3/uL — ABNORMAL HIGH (ref 4.0–10.5)
nRBC: 0 % (ref 0.0–0.2)

## 2019-03-22 LAB — RAPID URINE DRUG SCREEN, HOSP PERFORMED
Amphetamines: NOT DETECTED
Barbiturates: NOT DETECTED
Benzodiazepines: NOT DETECTED
Cocaine: NOT DETECTED
Opiates: NOT DETECTED
Tetrahydrocannabinol: NOT DETECTED

## 2019-03-22 LAB — URINALYSIS, MICROSCOPIC (REFLEX)

## 2019-03-22 NOTE — ED Notes (Signed)
Spoke to Pt daughter. Daughter stated Pt has "mental health issue that she does not see who she is suppose to see for it" Daughter also stated that pt has done scary things in the past"

## 2019-03-22 NOTE — Discharge Instructions (Addendum)
You are seen today for fatigue.  Your urinalysis and your urine drug screen looked very reassuring.  Your other labs also look very reassuring.  Your potassium was just mildly low.  Usually this is from not getting enough potassium in your diet.  I have given you some information on foods that contain potassium that you can increase in your diet.  You can also take a daily multivitamin. Thank you for allowing me to care for you today. Please return to the emergency department if you have new or worsening symptoms. Take your medications as instructed.

## 2019-03-22 NOTE — ED Notes (Signed)
Pt states "she is worried about people coming in her house and moving things around she just hasn't caught them yet She is worried about COVID contamination" Pt reports no pain or other problems at this time. VSS

## 2019-03-22 NOTE — ED Triage Notes (Signed)
Patient reports she wants to be tested for COVID. Patient reports she has been having "trouble staying awake" Patient reports there is a light on her computer at work that she is unsure if it is making her sleeping.

## 2019-03-22 NOTE — ED Provider Notes (Signed)
[facility] DEPT Provider Note   CSN: [id number] Arrival date & time: 03/22/19  1254     History   Chief Complaint Chief Complaint  Patient presents with  . Fatigue  . Paranoid    HPI Bianca Medina is a 68 y.o. female.     Patient is a 68 year old female has a history of obesity, hypertension, diabetes, GERD presenting to the emergency department wanting to be "checked out".  Patient reports that she has been feeling fatigued and tired and that she thinks that this is all due to someone trying to poison her.  Reports that she thinks someone is coming into her house every day and poisoning her.  Also reports that she thinks that she is being poisoned at work.  Reports that every time she sits in front of her computer it is making her sleepy and have a headache and she noticed a new light on the computer and is suspicious of it.  She reports that she has called mom York Cerise as well as security at her work multiple times with no avail.  She denies actually seeing anyone in the act of poisoning her or entering her house.  Denies any chest pain, shortness of breath, fever, chills, nausea, vomiting, dysuria.  She denies any SI, HI, hallucinations     Past Medical History:  Diagnosis Date  . Anemia 2003   IRON DEFICIENCY   . Asthma   . Diabetes mellitus without complication ([place])   . GERD (gastroesophageal reflux disease)   . Hyperlipidemia, mild   . Hypertension   . NSVD (normal spontaneous vaginal delivery)    X3  . Obesity   . PMDD (premenstrual dysphoric disorder)   . Sleep disorder     Patient Active Problem List   Diagnosis Date Noted  . Sensorineural hearing loss (SNHL), bilateral 08/12/2017  . Tinnitus of both ears 08/12/2017  . Morbid obesity ([place]) 12/28/2016  . Left breast mass 07/19/2014  . Breast tenderness in female 07/19/2014  . Menopause 01/09/2014  . Vaginal atrophy 01/09/2014  . Skin lesion 12/23/2012  . HTN (hypertension)  10/31/2012  . Heartburn 10/31/2012    Past Surgical History:  Procedure Laterality Date  . ABDOMINAL HYSTERECTOMY    . TUBAL LIGATION       OB History    Gravida  3   Para  3   Term      Preterm      AB      Living  3     SAB      TAB      Ectopic      Multiple      Live Births               Home Medications    Prior to Admission medications   Medication Sig Start Date End Date Taking? Authorizing Provider  Ascorbic Acid (VITAMIN C PO) Take 1 tablet by mouth daily.     [provider]  azelastine (ASTELIN) 0.1 % nasal spray Place 1 spray into both nostrils daily.  08/06/17   [provider]  Biotin 5000 MCG CAPS Take 5,000 mcg by mouth daily.     [provider]  chlorthalidone (HYGROTON) 25 MG tablet Take 25 mg by mouth daily.  04/08/16   [provider]  Cholecalciferol (VITAMIN D3) 1000 units CAPS Take 500 Units by mouth every Monday, Wednesday, and Friday.     [provider]  esomeprazole ([place]) 40  MG capsule Take 40 mg by mouth daily.     [provider]  linaclotide (LINZESS) 145 MCG CAPS capsule Take 145 mcg by mouth daily before breakfast.    [provider]  loratadine (CLARITIN) 10 MG tablet Take 10 mg by mouth daily.    [provider]  losartan (COZAAR) 50 MG tablet Take 50 mg by mouth every morning.  10/13/17   [provider]  metFORMIN (GLUCOPHAGE) 500 MG tablet Take 500 mg by mouth daily.     [provider]  PROAIR HFA 108 [phone] Base) MCG/ACT inhaler Inhale 1-2 puffs into the lungs every 4 (four) hours as needed for wheezing or shortness of breath.  03/16/18   [provider]  QUEtiapine (SEROQUEL) 25 MG tablet Take 25 mg by mouth at bedtime.    [provider]    Family History Family History  Problem Relation Age of Onset  . Cancer Mother        LUNG- SMOKER  . Diabetes Father   . Hypertension Father   . Heart disease Father    . Stroke Father   . Cancer Paternal Aunt        COLON  . Cancer Paternal Uncle        COLON  . Breast cancer Sister     Social History Social History   Tobacco Use  . Smoking status: Never Smoker  . Smokeless tobacco: Never Used  Substance Use Topics  . Alcohol use: No    Alcohol/week: 0.0 standard drinks  . Drug use: No     Allergies   Codeine   Review of Systems Review of Systems  Constitutional: Positive for fatigue. Negative for activity change, appetite change, chills, diaphoresis, fever and unexpected weight change.  HENT: Negative for congestion, sinus pressure and sinus pain.   Eyes: Negative for visual disturbance.  Respiratory: Negative for cough and shortness of breath.   Cardiovascular: Negative for chest pain, palpitations and leg swelling.  Gastrointestinal: Negative for abdominal pain, diarrhea, nausea and vomiting.  Genitourinary: Negative for dysuria and hematuria.  Skin: Negative for rash.  Neurological: Positive for headaches. Negative for dizziness, tremors, seizures, syncope, facial asymmetry, speech difficulty, weakness, light-headedness and numbness.  Psychiatric/Behavioral: Positive for sleep disturbance. Negative for agitation, behavioral problems, hallucinations, self-injury and suicidal ideas. The patient is nervous/anxious. The patient is not hyperactive.      Physical Exam Updated Vital Signs BP 121/66   Pulse 77   Temp 98.9 F (37.2 C) (Oral)   Resp 18   SpO2 99%   Physical Exam Vitals signs and nursing note reviewed.  Constitutional:      Appearance: Normal appearance.  HENT:     Head: Normocephalic.     Mouth/Throat:     Mouth: Mucous membranes are moist.  Eyes:     Conjunctiva/sclera: Conjunctivae normal.  Cardiovascular:     Rate and Rhythm: Normal rate and regular rhythm.  Pulmonary:     Effort: Pulmonary effort is normal.     Breath sounds: Normal breath sounds.  Abdominal:     General: Abdomen is flat. Bowel sounds  are normal. There is no distension.  Skin:    General: Skin is warm and dry.     Coloration: Skin is not jaundiced or pale.     Findings: No bruising, erythema, lesion or rash.  Neurological:     General: No focal deficit present.     Mental Status: She is alert.  Psychiatric:  Mood and Affect: Mood normal.      ED Treatments / Results  Labs (all labs ordered are listed, but only abnormal results are displayed) Labs Reviewed  URINALYSIS, MICROSCOPIC (REFLEX) - Abnormal; Notable for the following components:      Result Value   Bacteria, UA MANY (*)    All other components within normal limits  COMPREHENSIVE METABOLIC PANEL - Abnormal; Notable for the following components:   Potassium 3.3 (*)    Glucose, Bld 101 (*)    Albumin 3.3 (*)    All other components within normal limits  CBC WITH DIFFERENTIAL/PLATELET - Abnormal; Notable for the following components:   WBC 12.1 (*)    MCV 75.4 (*)    MCH 25.0 (*)    Neutro Abs 8.0 (*)    All other components within normal limits  URINE CULTURE  URINALYSIS, ROUTINE W REFLEX MICROSCOPIC  RAPID URINE DRUG SCREEN, HOSP PERFORMED    EKG None  Radiology No results found.  Procedures Procedures (including critical care time)  Medications Ordered in ED Medications - No data to display   Initial Impression / Assessment and Plan / ED Course  I have reviewed the triage vital signs and the nursing notes.  Pertinent labs & imaging results that were available during my care of the patient were reviewed by me and considered in my medical decision making (see chart for details).  Clinical Course as of Mar 21 2206  Wed Mar 22, 2019  2206 Patient presenting to the emergency department for fatigue with suspicion that she is being poisoned at home and at work and the computers at work are making her sleepy.  Her physical exam was negative.  Vital signs were all normal.  Labs were reassuring she did have a mild hypokalemia and was  given education on this.  Patient was advised to follow-up with her primary care doctor.  She denies any suicidal ideation, homicidal ideation, hallucinations   [KM]    Clinical Course User Index [KM] Alveria Apley, PA-C       Based on review of vitals, medical screening exam, lab work and/or imaging, there does not appear to be an acute, emergent etiology for the patient's symptoms. Counseled pt on good return precautions and encouraged both PCP and ED follow-up as needed.  Prior to discharge, I also discussed incidental imaging findings with patient in detail and advised appropriate, recommended follow-up in detail.  Clinical Impression: 1. Fatigue, unspecified type     Disposition: Discharge  Prior to providing a prescription for a controlled substance, I independently reviewed the patient's recent prescription history on the [facility]. The patient had no recent or regular prescriptions and was deemed appropriate for a brief, less than 3 day prescription of narcotic for acute analgesia.  This note was prepared with assistance of Systems analyst. Occasional wrong-word or sound-a-like substitutions may have occurred due to the inherent limitations of voice recognition software.   Final Clinical Impressions(s) / ED Diagnoses   Final diagnoses:  Fatigue, unspecified type    ED Discharge Orders    None       Kristine Royal 03/22/19 2207    Milton Ferguson, MD 03/22/19 2251

## 2019-03-24 LAB — URINE CULTURE: Culture: 20000 — AB

## 2019-03-26 ENCOUNTER — Telehealth (HOSPITAL_COMMUNITY): Payer: Self-pay | Admitting: Emergency Medicine

## 2019-03-26 ENCOUNTER — Encounter (HOSPITAL_COMMUNITY): Payer: Self-pay | Admitting: *Deleted

## 2019-03-26 ENCOUNTER — Ambulatory Visit (HOSPITAL_COMMUNITY)
Admission: EM | Admit: 2019-03-26 | Discharge: 2019-03-26 | Disposition: A | Discharge status: Home / Self Care | Payer: BC Managed Care – PPO

## 2019-03-26 ENCOUNTER — Other Ambulatory Visit: Payer: Self-pay

## 2019-03-26 DIAGNOSIS — W57XXXA Bitten or stung by nonvenomous insect and other nonvenomous arthropods, initial encounter: Secondary | ICD-10-CM | POA: Diagnosis not present

## 2019-03-26 DIAGNOSIS — R42 Dizziness and giddiness: Secondary | ICD-10-CM | POA: Diagnosis not present

## 2019-03-26 DIAGNOSIS — S1096XA Insect bite of unspecified part of neck, initial encounter: Secondary | ICD-10-CM

## 2019-03-26 NOTE — ED Provider Notes (Signed)
[place]    CSN: [id number] Arrival date & time: 03/26/19  1010      History   Chief Complaint Chief Complaint  Patient presents with  . Insect Bite  . Headache  . Dizziness    HPI Bianca Medina is a 68 y.o. female.   Pt was bitten on the left side of her by something.  Pt has been using rubbing alcohol with some relief.  Pt also reports she felt dizzy.  Pt wasseen in ED 4 days ago and told that her potassium was low.   The history is provided by the patient. No language interpreter was used.  Headache Pain location:  Generalized Onset quality:  Gradual Timing:  Constant Progression:  Worsening Similar to prior headaches: no   Relieved by:  Nothing Worsened by:  Nothing Ineffective treatments:  None tried Associated symptoms: dizziness   Dizziness Associated symptoms: headaches     Past Medical History:  Diagnosis Date  . Anemia 2003   IRON DEFICIENCY   . Asthma   . Diabetes mellitus without complication ([place])   . GERD (gastroesophageal reflux disease)   . Hyperlipidemia, mild   . Hypertension   . NSVD (normal spontaneous vaginal delivery)    X3  . Obesity   . PMDD (premenstrual dysphoric disorder)   . Sleep disorder     Patient Active Problem List   Diagnosis Date Noted  . Sensorineural hearing loss (SNHL), bilateral 08/12/2017  . Tinnitus of both ears 08/12/2017  . Morbid obesity ([place]) 12/28/2016  . Left breast mass 07/19/2014  . Breast tenderness in female 07/19/2014  . Menopause 01/09/2014  . Vaginal atrophy 01/09/2014  . Skin lesion 12/23/2012  . HTN (hypertension) 10/31/2012  . Heartburn 10/31/2012    Past Surgical History:  Procedure Laterality Date  . ABDOMINAL HYSTERECTOMY    . TUBAL LIGATION      OB History    Gravida  3   Para  3   Term      Preterm      AB      Living  3     SAB      TAB      Ectopic      Multiple      Live Births               Home Medications    Prior to Admission  medications   Medication Sig Start Date End Date Taking? Authorizing Provider  azelastine (ASTELIN) 0.1 % nasal spray Place 1 spray into both nostrils daily.  08/06/17  Yes [provider]  Biotin 5000 MCG CAPS Take 5,000 mcg by mouth daily.    Yes [provider]  chlorthalidone (HYGROTON) 25 MG tablet Take 25 mg by mouth daily.  04/08/16  Yes [provider]  esomeprazole (NEXIUM) 40 MG capsule Take 40 mg by mouth daily.    Yes [provider]  linaclotide (LINZESS) 145 MCG CAPS capsule Take 145 mcg by mouth daily before breakfast.   Yes [provider]  losartan (COZAAR) 50 MG tablet Take 50 mg by mouth every morning.  10/13/17  Yes [provider]  metFORMIN (GLUCOPHAGE) 500 MG tablet Take 500 mg by mouth daily.    Yes [provider]  Multiple Vitamin (MULTIVITAMIN) tablet Take 1 tablet by mouth daily.   Yes [provider]  QUEtiapine (SEROQUEL) 25 MG tablet Take 25 mg by mouth at bedtime.   Yes [provider]  Ascorbic Acid (VITAMIN C PO) Take 1 tablet by mouth daily.     [provider]  Cholecalciferol (VITAMIN D3) 1000 units CAPS Take 500 Units by mouth every Monday, Wednesday, and Friday.     [provider]  loratadine (CLARITIN) 10 MG tablet Take 10 mg by mouth daily.    [provider]  PROAIR HFA 108 [phone] Base) MCG/ACT inhaler Inhale 1-2 puffs into the lungs every 4 (four) hours as needed for wheezing or shortness of breath.  03/16/18   [provider]    Family History Family History  Problem Relation Age of Onset  . Cancer Mother        LUNG- SMOKER  . Diabetes Father   . Hypertension Father   . Heart disease Father   . Stroke Father   . Cancer Paternal Aunt        COLON  . Cancer Paternal Uncle        COLON  . Breast cancer Sister     Social History Social History   Tobacco Use  . Smoking status: Never Smoker  . Smokeless tobacco: Never Used   Substance Use Topics  . Alcohol use: No  . Drug use: No     Allergies   Codeine   Review of Systems Review of Systems  Neurological: Positive for dizziness and headaches.  All other systems reviewed and are negative.    Physical Exam Triage Vital Signs ED Triage Vitals  Enc Vitals Group     BP 03/26/19 1036 124/72     Pulse Rate 03/26/19 1036 72     Resp 03/26/19 1036 20     Temp 03/26/19 1036 97.7 F (36.5 C)     Temp Source 03/26/19 1036 Other     SpO2 03/26/19 1036 95 %     Weight --      Height --      Head Circumference --      Peak Flow --      Pain Score 03/26/19 1037 0     Pain Loc --      Pain Edu? --      Excl. in [place]? --    No data found.  Updated Vital Signs BP 124/72   Pulse 72   Temp 97.7 F (36.5 C) (Other (Comment))   Resp 20   SpO2 95%   Visual Acuity Right Eye Distance:   Left Eye Distance:   Bilateral Distance:    Right Eye Near:   Left Eye Near:    Bilateral Near:     Physical Exam Vitals signs and nursing note reviewed.  Constitutional:      Appearance: She is well-developed.  HENT:     Head: Normocephalic.  Neck:     Musculoskeletal: Normal range of motion.  Pulmonary:     Effort: Pulmonary effort is normal.  Abdominal:     General: There is no distension.  Musculoskeletal: Normal range of motion.  Skin:    Comments: 2cm raised area left neck,  Looks like a bite.   Neurological:     Mental Status: She is alert and oriented to person, place, and time.      UC Treatments / Results  Labs (all labs ordered are listed, but only abnormal results are displayed) Labs Reviewed - No data to display  EKG   Radiology No results found.  Procedures Procedures (including critical care time)  Medications Ordered in UC Medications - No data to display  Initial  Impression / Assessment and Plan / UC Course  I have reviewed the triage vital signs and the nursing notes.  Pertinent labs & imaging results that were  available during my care of the patient were reviewed by me and considered in my medical decision making (see chart for details).     MDM   Pt looks good.  Vitals normal, heart sound good.  Pt 's ED visit reviewed.  Pt advised to take potassium supplement.  Hydrocortisone for bite  Final Clinical Impressions(s) / UC Diagnoses   Final diagnoses:  Insect bite, unspecified site, initial encounter  Dizziness     Discharge Instructions     Use hydrocortisone ointment to area of swelling    ED Prescriptions    None     PDMP not reviewed this encounter.  An After Visit Summary was printed and given to the patient.    Fransico Meadow, [place] 03/26/19 1116

## 2019-03-26 NOTE — Telephone Encounter (Signed)
Pt just left UC and wanted to call back to have on record that when she got her possible insect bite she states she heard a squeak.  She is having [employer] come out to inspect her home for mice and if they find one she is going to call us back.

## 2019-03-26 NOTE — ED Notes (Signed)
I called pt 3 times, I checked for the pt in the waiting area and restroom in the waiting area, and I asked to the staff at the front desk. Pt did not answered.

## 2019-03-26 NOTE — ED Triage Notes (Addendum)
Pt reports noticing a bump to left lateral neck yesterday with pruritis and pain shooting around to posterior neck, along with HA and slight dizziness.  HA has since resolved. States lesion seems to have resolved, but continues with dizziness.  Reports being in ED 03/22/19 for "concern I had ingested something" w/ dx fatigue.

## 2019-03-26 NOTE — Discharge Instructions (Addendum)
Use hydrocortisone ointment to area of swelling

## 2019-03-30 ENCOUNTER — Ambulatory Visit: Payer: BC Managed Care – PPO | Admitting: Family Medicine

## 2019-03-30 ENCOUNTER — Other Ambulatory Visit: Payer: Self-pay

## 2019-03-31 ENCOUNTER — Encounter: Payer: Self-pay | Admitting: Family Medicine

## 2019-03-31 ENCOUNTER — Telehealth (INDEPENDENT_AMBULATORY_CARE_PROVIDER_SITE_OTHER): Payer: BC Managed Care – PPO | Admitting: Family Medicine

## 2019-03-31 DIAGNOSIS — I10 Essential (primary) hypertension: Secondary | ICD-10-CM

## 2019-03-31 DIAGNOSIS — Z862 Personal history of diseases of the blood and blood-forming organs and certain disorders involving the immune mechanism: Secondary | ICD-10-CM

## 2019-03-31 DIAGNOSIS — R7303 Prediabetes: Secondary | ICD-10-CM | POA: Diagnosis not present

## 2019-03-31 DIAGNOSIS — B349 Viral infection, unspecified: Secondary | ICD-10-CM
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
## 2019-04-03 ENCOUNTER — Other Ambulatory Visit: Payer: Self-pay

## 2019-04-03 DIAGNOSIS — Z20822 Contact with and (suspected) exposure to covid-19: Secondary | ICD-10-CM

## 2019-04-05 LAB — NOVEL CORONAVIRUS, NAA: SARS-CoV-2, NAA: NOT DETECTED

## 2019-04-17 ENCOUNTER — Ambulatory Visit: Payer: BC Managed Care – PPO | Admitting: Family Medicine

## 2019-05-16 ENCOUNTER — Telehealth: Payer: BC Managed Care – PPO | Admitting: Family Medicine

## 2019-05-19 DIAGNOSIS — D126 Benign neoplasm of colon, unspecified: Secondary | ICD-10-CM

## 2019-05-19 HISTORY — DX: Benign neoplasm of colon, unspecified: D12.6

## 2019-05-24 ENCOUNTER — Ambulatory Visit: Payer: BC Managed Care – PPO | Admitting: Podiatry

## 2019-05-25 ENCOUNTER — Ambulatory Visit (INDEPENDENT_AMBULATORY_CARE_PROVIDER_SITE_OTHER): Payer: Medicare Other

## 2019-05-25 ENCOUNTER — Ambulatory Visit: Payer: BC Managed Care – PPO | Admitting: Podiatry

## 2019-05-25 ENCOUNTER — Other Ambulatory Visit: Payer: Self-pay

## 2019-05-25 DIAGNOSIS — M79671 Pain in right foot: Secondary | ICD-10-CM | POA: Diagnosis not present

## 2019-05-25 DIAGNOSIS — M79672 Pain in left foot: Secondary | ICD-10-CM

## 2019-05-25 DIAGNOSIS — G629 Polyneuropathy, unspecified: Secondary | ICD-10-CM

## 2019-05-29 NOTE — Progress Notes (Signed)
Subjective:   Patient ID: Bianca Medina, female   DOB: 69 y.o.   MRN: [id number]   HPI Patient presents stating that she has tingling in both her feet that she is concerned about and states that it seems to be worse at nighttime.  Has tried medications that she was not able to tolerate at the current time   ROS      Objective:  Physical Exam  Neurovascular status intact negative Bevelyn Buckles' sign noted with patient found to have good digital perfusion and minimal disruption of sharp dull vibratory with long-term history of diabetes and morbid obesity     Assessment:  Most likely neuropathic condition secondary to obesity and diabetes     Plan:  H&P all conditions reviewed and I discussed treatment options including consideration of different types of medicine of which she does not want currently.  Will try soaks in the evening and supportive shoes and see response

## 2019-05-30 ENCOUNTER — Other Ambulatory Visit: Payer: Self-pay | Admitting: Podiatry

## 2019-05-30 DIAGNOSIS — M79672 Pain in left foot: Secondary | ICD-10-CM

## 2019-05-30 DIAGNOSIS — M79671 Pain in right foot: Secondary | ICD-10-CM

## 2019-06-23 ENCOUNTER — Other Ambulatory Visit: Payer: Self-pay

## 2019-06-23 ENCOUNTER — Encounter: Payer: Self-pay | Admitting: Neurology

## 2019-06-23 ENCOUNTER — Ambulatory Visit (INDEPENDENT_AMBULATORY_CARE_PROVIDER_SITE_OTHER): Payer: Medicare Other | Admitting: Neurology

## 2019-06-23 VITALS — BP 129/68 | HR 64 | Temp 96.6°F | Ht 64.5 in | Wt 264.0 lb

## 2019-06-23 DIAGNOSIS — G4489 Other headache syndrome: Secondary | ICD-10-CM

## 2019-06-23 DIAGNOSIS — G43009 Migraine without aura, not intractable, without status migrainosus: Secondary | ICD-10-CM

## 2019-06-23 HISTORY — DX: Migraine without aura, not intractable, without status migrainosus: G43.009

## 2019-06-23 NOTE — Progress Notes (Signed)
Reason for visit: Headache, myoclonus  Referring physician: Dr. Elyse Medina is a 69 y.o. female  History of present illness:  [name] is a 69 year old right-handed black female with a history of diabetes.  The patient indicates that about a year ago she began having intermittent headaches that are easily controlled with baby aspirin.  She indicates that she typically will get a headache with weather changes, she has attributed this to allergies or sinus problems.  She averages about 2 headaches a month.  She denies a headache syndrome of any sort at younger ages.  The patient denies any nausea or vomiting or any visual field changes with the headache.  She claims that 3 days prior to this evaluation she had episodes of sharp jabbing pain on the top of her head that occurred in a series of 2 or 3 in a row, each episode lasting about 3 seconds.  She has not had recurrence of this since that time but after the sharp pain she developed a bit of a right frontotemporal headache and headache around the right eye.  This improved rapidly after administration of low-dose aspirin.  The patient reports no focal numbness or weakness of the face, arms, legs.  She also notes episodes of quick jerks of the body as she transitions into sleep at times.  The patient denies that these jerks occur throughout the night or wake her up after she has gone to sleep.  At work, when she is looking at a computer she may sometimes start to drift off to sleep and then have a jerk awake as well.  The patient claims that in general she is not sleeping well but she denies excessive daytime drowsiness.  She is sent to this office for further evaluation of the above events.  Past Medical History:  Diagnosis Date  . Anemia 2003   IRON DEFICIENCY   . Asthma   . Diabetes mellitus without complication ([place])   . GERD (gastroesophageal reflux disease)   . Hyperlipidemia, mild   . Hypertension   . NSVD (normal spontaneous  vaginal delivery)    X3  . Obesity   . PMDD (premenstrual dysphoric disorder)   . Sleep disorder     Past Surgical History:  Procedure Laterality Date  . ABDOMINAL HYSTERECTOMY    . GUM SURGERY  1980's  . TUBAL LIGATION      Family History  Problem Relation Age of Onset  . Cancer Mother        LUNG- SMOKER  . Diabetes Father   . Hypertension Father   . Heart disease Father   . Stroke Father   . Cancer Paternal Aunt        COLON  . Cancer Paternal Uncle        COLON  . Breast cancer Sister     Social history:  reports that she has never smoked. She has never used smokeless tobacco. She reports that she does not drink alcohol or use drugs.  Medications:  Prior to Admission medications   Medication Sig Start Date End Date Taking? Authorizing Provider  Ascorbic Acid (VITAMIN C PO) Take 1 tablet by mouth daily.    Yes [provider]  azelastine (ASTELIN) 0.1 % nasal spray Place 1 spray into both nostrils daily.  08/06/17  Yes [provider]  Biotin 5000 MCG CAPS Take 5,000 mcg by mouth daily.    Yes [provider]  chlorthalidone (HYGROTON) 25 MG tablet  Take 25 mg by mouth daily.  04/08/16  Yes [provider]  Cholecalciferol (VITAMIN D3) 1000 units CAPS Take 500 Units by mouth every Monday, Wednesday, and Friday.    Yes [provider]  esomeprazole (NEXIUM) 40 MG capsule Take 40 mg by mouth daily.    Yes [provider]  linaclotide (LINZESS) 145 MCG CAPS capsule Take 145 mcg by mouth daily before breakfast.   Yes [provider]  losartan (COZAAR) 50 MG tablet Take 50 mg by mouth every morning.  10/13/17  Yes [provider]  metFORMIN (GLUCOPHAGE) 500 MG tablet Take 500 mg by mouth daily.    Yes [provider]  montelukast (SINGULAIR) 10 MG tablet Take 10 mg by mouth daily. 03/15/19  Yes [provider]  Multiple Vitamin (MULTIVITAMIN) tablet Take 1 tablet by mouth daily.   Yes  [provider]  potassium chloride (MICRO-K) 10 MEQ CR capsule  05/15/19  Yes [provider]  PROAIR HFA 108 (90 Base) MCG/ACT inhaler Inhale 1-2 puffs into the lungs every 4 (four) hours as needed for wheezing or shortness of breath.  03/16/18  Yes [provider]  triamcinolone cream (KENALOG) 0.5 %  04/11/19  Yes [provider]      Allergies  Allergen Reactions  . Codeine Other (See Comments)    nightmares    ROS:  Out of a complete 14 system review of symptoms, the patient complains only of the following symptoms, and all other reviewed systems are negative.  Headache Nocturnal myoclonus Easy bruising  Blood pressure 129/68, pulse 64, temperature (!) 96.6 F (35.9 C), height 5' 4.5" (1.638 m), weight 264 lb (119.7 kg), SpO2 96 %.  Physical Exam  General: The patient is alert and cooperative at the time of the examination.  The patient is moderately to markedly obese.  Eyes: Pupils are equal, round, and reactive to light. Discs are flat bilaterally.  Good venous pulsations are noted.  Neck: The neck is supple, no carotid bruits are noted.  Respiratory: The respiratory examination is clear.  Cardiovascular: The cardiovascular examination reveals a regular rate and rhythm, no obvious murmurs or rubs are noted.  Skin: Extremities are without significant edema.  Neurologic Exam  Mental status: The patient is alert and oriented x 3 at the time of the examination. The patient has apparent normal recent and remote memory, with an apparently normal attention span and concentration ability.  Cranial nerves: Facial symmetry is present. There is good sensation of the face to pinprick and soft touch bilaterally. The strength of the facial muscles and the muscles to head turning and shoulder shrug are normal bilaterally. Speech is well enunciated, no aphasia or dysarthria is noted. Extraocular movements are full. Visual fields are full. The  tongue is midline, and the patient has symmetric elevation of the soft palate. No obvious hearing deficits are noted.  Motor: The motor testing reveals 5 over 5 strength of all 4 extremities. Good symmetric motor tone is noted throughout.  Sensory: Sensory testing is intact to pinprick, soft touch, vibration sensation, and position sense on all 4 extremities. No evidence of extinction is noted.  Coordination: Cerebellar testing reveals good finger-nose-finger and heel-to-shin bilaterally.  Gait and station: Gait is normal. Tandem gait is normal. Romberg is negative. No drift is seen.  Reflexes: Deep tendon reflexes are symmetric and normal bilaterally. Toes are downgoing bilaterally.   Assessment/Plan:  1.  Common migraine headache  2.  "Ice pick pains"  3.  Nocturnal myoclonus  The patient appears to have a headache syndrome consistent with migraine, she has reported some scalp tenderness on the right with the headache and the headaches are brought on by weather changes.  The patient has described what sounds like ice pick pains that are a migraine epiphenomena.  However, the patient indicates that her headaches just started in her mid 92s, for this reason we will do blood work today and check MRI of the brain.  If the studies are unremarkable, no further evaluation is required.  The patient has myoclonus as she is transitioning into sleep, this is a benign phenomenon.  Jill Alexanders MD 06/23/2019 10:44 AM  [facility] [address] [place], [place] [address]  Phone [phone] Fax [phone]

## 2019-06-24 LAB — SEDIMENTATION RATE: Sed Rate: 34 mm/hr (ref 0–40)

## 2019-06-26 ENCOUNTER — Telehealth: Payer: Self-pay | Admitting: Neurology

## 2019-06-26 NOTE — Telephone Encounter (Signed)
Medicare order sent to GI

## 2019-06-27 DIAGNOSIS — L299 Pruritus, unspecified: Secondary | ICD-10-CM | POA: Insufficient documentation

## 2019-07-19 ENCOUNTER — Other Ambulatory Visit: Payer: Self-pay | Admitting: Internal Medicine

## 2019-07-19 DIAGNOSIS — Z1231 Encounter for screening mammogram for malignant neoplasm of breast: Secondary | ICD-10-CM

## 2019-07-20 ENCOUNTER — Ambulatory Visit
Admission: RE | Admit: 2019-07-20 | Discharge: 2019-07-20 | Disposition: A | Discharge status: Home / Self Care | Payer: BC Managed Care – PPO | Source: Ambulatory Visit | Attending: Neurology | Admitting: Neurology

## 2019-07-20 ENCOUNTER — Other Ambulatory Visit: Payer: Self-pay

## 2019-07-20 DIAGNOSIS — G4489 Other headache syndrome: Secondary | ICD-10-CM

## 2019-07-20 MED ORDER — GADOBENATE DIMEGLUMINE 529 MG/ML IV SOLN
20.0000 mL | Freq: Once | INTRAVENOUS | Status: AC | PRN
  Administered 2019-07-20: 20 mL via INTRAVENOUS

## 2019-07-21 ENCOUNTER — Other Ambulatory Visit: Payer: Self-pay | Admitting: Podiatry

## 2019-07-21 ENCOUNTER — Telehealth: Payer: Self-pay | Admitting: Neurology

## 2019-07-21 DIAGNOSIS — G629 Polyneuropathy, unspecified: Secondary | ICD-10-CM

## 2019-07-21 NOTE — Telephone Encounter (Signed)
I called the patient.  MRI of the brain is unremarkable, no concerns for elevated intracranial pressure, patient appears to have more of a migrainous symptomatology.  MRI brain 07/20/19:  IMPRESSION: This MRI of the brain with and without contrast shows the following: 1.   The brain appears normal for age. 2.   The pituitary gland has a reduced height within a normal sized sella turcica.  Optic nerve sheaths are top normal in diameter.  This could be an incidental finding.  However, if papilledema is present consider elevated intracranial pressure.

## 2019-08-07 ENCOUNTER — Other Ambulatory Visit: Payer: Self-pay

## 2019-08-08 ENCOUNTER — Ambulatory Visit (INDEPENDENT_AMBULATORY_CARE_PROVIDER_SITE_OTHER): Payer: Medicare Other | Admitting: Obstetrics & Gynecology

## 2019-08-08 ENCOUNTER — Encounter: Payer: Self-pay | Admitting: Obstetrics & Gynecology

## 2019-08-08 VITALS — BP 124/78 | Ht 64.0 in | Wt 261.0 lb

## 2019-08-08 DIAGNOSIS — Z01419 Encounter for gynecological examination (general) (routine) without abnormal findings: Secondary | ICD-10-CM

## 2019-08-08 DIAGNOSIS — Z90722 Acquired absence of ovaries, bilateral: Secondary | ICD-10-CM

## 2019-08-08 DIAGNOSIS — Z780 Asymptomatic menopausal state: Secondary | ICD-10-CM | POA: Diagnosis not present

## 2019-08-08 DIAGNOSIS — Z6841 Body Mass Index (BMI) 40.0 and over, adult: Secondary | ICD-10-CM

## 2019-08-08 DIAGNOSIS — Z90710 Acquired absence of both cervix and uterus: Secondary | ICD-10-CM

## 2019-08-08 DIAGNOSIS — Z9189 Other specified personal risk factors, not elsewhere classified: Secondary | ICD-10-CM | POA: Diagnosis not present

## 2019-08-08 MED ORDER — PHENTERMINE HCL 37.5 MG PO CAPS: 37.5000 mg | ORAL_CAPSULE | ORAL | 0 refills | Status: DC

## 2019-08-08 NOTE — Patient Instructions (Signed)
1. Well woman exam with routine gynecological exam Gynecologic exam status post TAH/BSO.  Pap test March 2018 was negative.  Abstinent.  No indication to repeat a Pap test at this time.  Breast exam normal.  Screening mammogram October 2020 was negative.  Colonoscopy with Dr. Collene Mares.  Diabetes mellitus type 2 on Metformin.  Health labs with family physician.  2. S/P TAH-BSO  3. Postmenopausal Well on no hormone replacement therapy.  Bone density in 2018 was normal, will repeat at 5 years.  Vitamin D supplements, calcium intake of 1200 mg daily and regular weightbearing physical activity is recommended.  4. Class 3 severe obesity due to excess calories with serious comorbidity and body mass index (BMI) of 40.0 to 44.9 in adult [facility]) Recommend a lower calorie/carb diet such as [employer].  Phentermine prescribed for 1 months to help reduce her caloric intake.  No contraindication to phentermine.  Usage reviewed and prescription sent to pharmacy.  Recommend aerobic activities 5 times a week and light weightlifting every 2 days.  Other orders - phentermine 37.5 MG capsule; Take 1 capsule (37.5 mg total) by mouth every morning.  Tyniesha, it was a pleasure seeing you today!

## 2019-08-08 NOTE — Progress Notes (Signed)
Bianca Medina 1950/10/24 [id number]   History:    69 y.o. G3P3L3  Single.  7 grand-children.  RP:  Established patient presenting for annual gyn exam   HPI: S/P TAH/BSO in 2004 for Fibroids.  Menopause, well on no hormone replacement therapy.  No pelvic pain. Abstinent.  Dr. Collene Mares who is treating her constipation and doing colonoscopy as needed.  Urine normal.  Breast normal.  Body mass index decreased x last year, now at 44.8. Would like to be back on Phentermine.  Walking regularly.  Type 2 diabetes on metformin.  Health labs with family physician.   Past medical history,surgical history, family history and social history were all reviewed and documented in the EPIC chart.  Gynecologic History No LMP recorded. Patient has had a hysterectomy.  Obstetric History OB History  Gravida Para Term Preterm AB Living  3 3       3   SAB TAB Ectopic Multiple Live Births               # Outcome Date GA Lbr Len/2nd Weight Sex Delivery Anes PTL Lv  3 Para           2 Para           1 Para              ROS: A ROS was performed and pertinent positives and negatives are included in the history.  GENERAL: No fevers or chills. HEENT: No change in vision, no earache, sore throat or sinus congestion. NECK: No pain or stiffness. CARDIOVASCULAR: No chest pain or pressure. No palpitations. PULMONARY: No shortness of breath, cough or wheeze. GASTROINTESTINAL: No abdominal pain, nausea, vomiting or diarrhea, melena or bright red blood per rectum. GENITOURINARY: No urinary frequency, urgency, hesitancy or dysuria. MUSCULOSKELETAL: No joint or muscle pain, no back pain, no recent trauma. DERMATOLOGIC: No rash, no itching, no lesions. ENDOCRINE: No polyuria, polydipsia, no heat or cold intolerance. No recent change in weight. HEMATOLOGICAL: No anemia or easy bruising or bleeding. NEUROLOGIC: No headache, seizures, numbness, tingling or weakness. PSYCHIATRIC: No depression, no loss of interest in normal  activity or change in sleep pattern.     Exam:   BP 124/78   Ht 5\' 4"  (1.626 m)   Wt 261 lb (118.4 kg)   BMI 44.80 kg/m   Body mass index is 44.8 kg/m.  General appearance : Well developed well nourished female. No acute distress HEENT: Eyes: no retinal hemorrhage or exudates,  Neck supple, trachea midline, no carotid bruits, no thyroidmegaly Lungs: Clear to auscultation, no rhonchi or wheezes, or rib retractions  Heart: Regular rate and rhythm, no murmurs or gallops Breast:Examined in sitting and supine position were symmetrical in appearance, no palpable masses or tenderness,  no skin retraction, no nipple inversion, no nipple discharge, no skin discoloration, no axillary or supraclavicular lymphadenopathy Abdomen: no palpable masses or tenderness, no rebound or guarding Extremities: no edema or skin discoloration or tenderness  Pelvic: Vulva: Normal             Vagina: No gross lesions or discharge  Cervix/Uterus absent  Adnexa  Without masses or tenderness  Anus: Normal   Assessment/Plan:  69 y.o. female for annual exam   1. Well woman exam with routine gynecological exam Gynecologic exam status post TAH/BSO.  Pap test March 2018 was negative.  Abstinent.  No indication to repeat a Pap test at this time.  Breast exam normal.  Screening mammogram October 2020 was  negative.  Colonoscopy with Dr. Collene Mares.  Diabetes mellitus type 2 on Metformin.  Health labs with family physician.  2. S/P TAH-BSO  3. Postmenopausal Well on no hormone replacement therapy.  Bone density in 2018 was normal, will repeat at 5 years.  Vitamin D supplements, calcium intake of 1200 mg daily and regular weightbearing physical activity is recommended.  4. Class 3 severe obesity due to excess calories with serious comorbidity and body mass index (BMI) of 40.0 to 44.9 in adult [facility]) Recommend a lower calorie/carb diet such as [employer].  Phentermine prescribed for 1 months to help reduce her caloric  intake.  No contraindication to phentermine.  Usage reviewed and prescription sent to pharmacy.  Recommend aerobic activities 5 times a week and light weightlifting every 2 days.  Other orders - phentermine 37.5 MG capsule; Take 1 capsule (37.5 mg total) by mouth every morning.  Princess Bruins MD, 3:44 PM 08/08/2019

## 2019-08-15 ENCOUNTER — Other Ambulatory Visit: Payer: Self-pay | Admitting: Internal Medicine

## 2019-08-15 ENCOUNTER — Ambulatory Visit
Admission: RE | Admit: 2019-08-15 | Discharge: 2019-08-15 | Disposition: A | Discharge status: Home / Self Care | Payer: Medicare Other | Source: Ambulatory Visit | Attending: Internal Medicine | Admitting: Internal Medicine

## 2019-08-15 DIAGNOSIS — M25562 Pain in left knee: Secondary | ICD-10-CM

## 2019-08-15 IMAGING — CR DG KNEE COMPLETE 4+V*L*
4 series · 4 of 4 positions shown · non-contrast
Comparison: None

CLINICAL DATA: LEFT knee pain for 2 weeks, no known injury,
diabetes mellitus, hypertension

EXAM:
LEFT KNEE - COMPLETE 4+ VIEW

[t knee ap left]
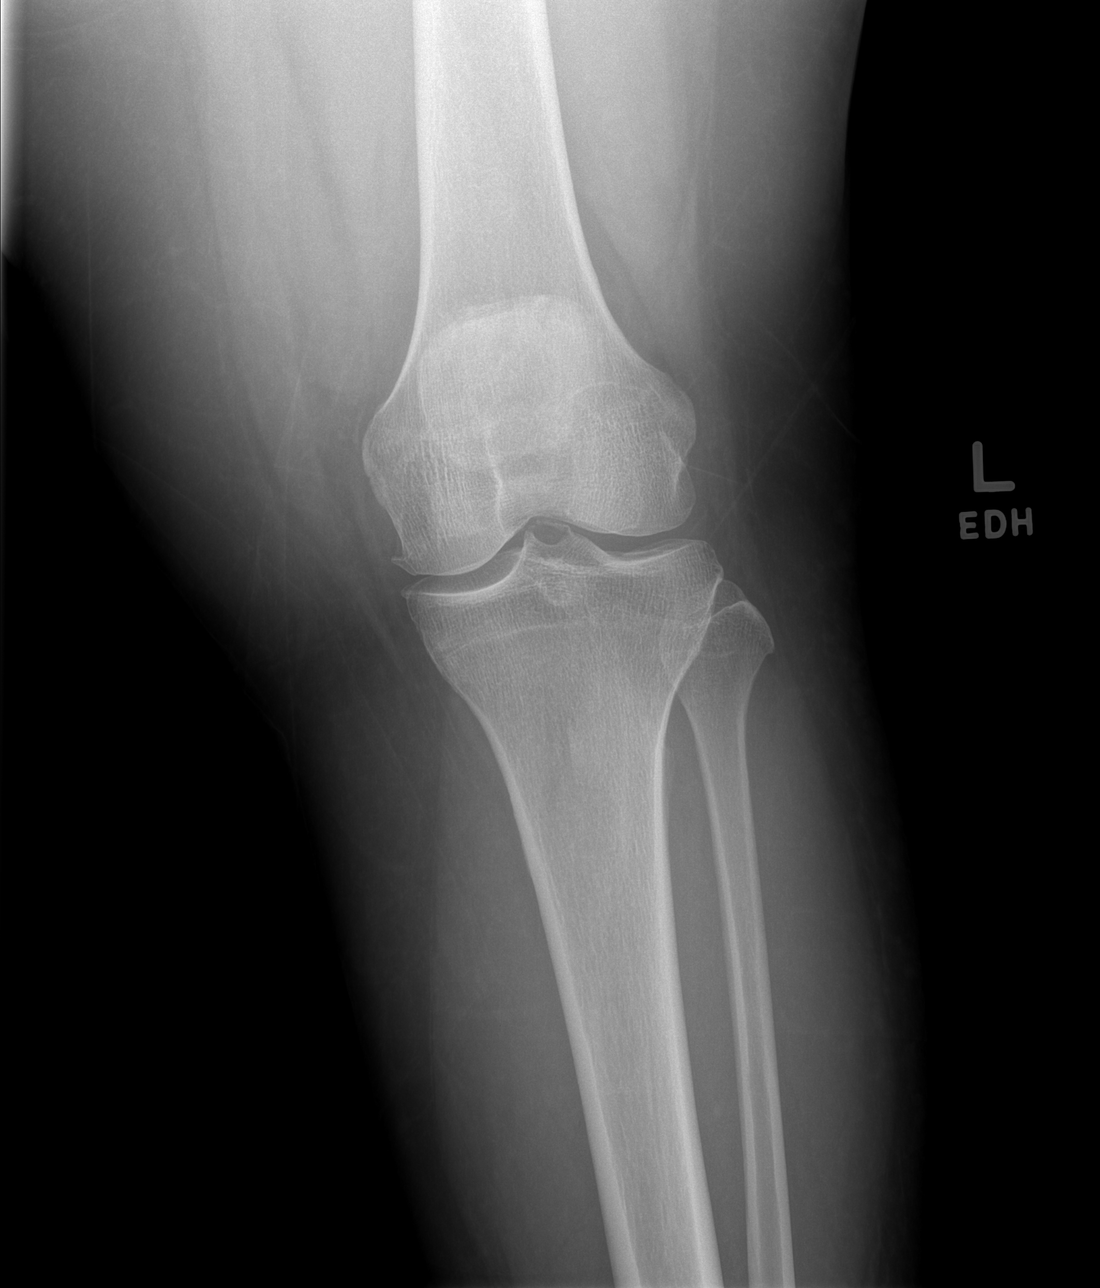

[t knee oblique left (1 of 2)]
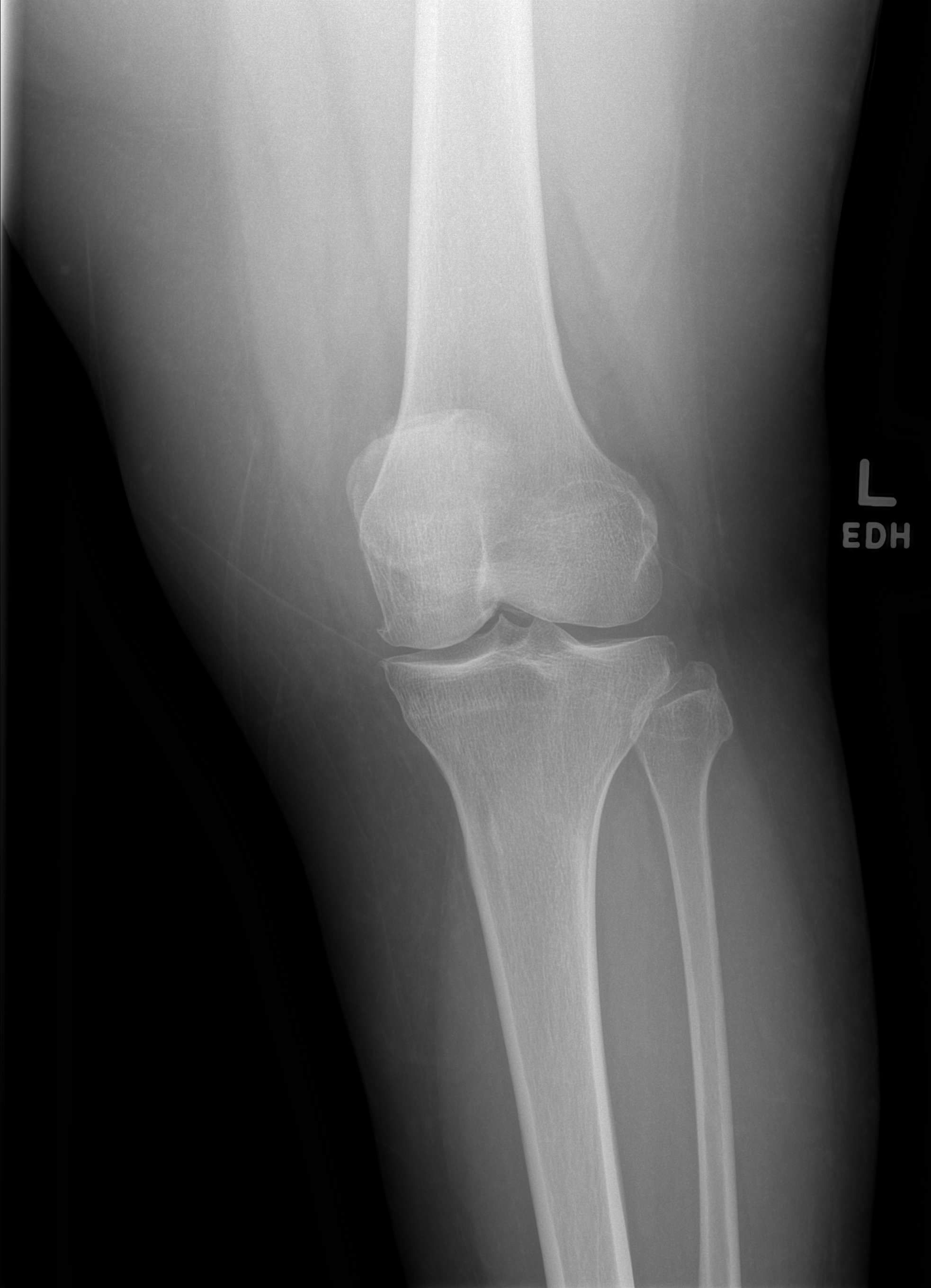

[t knee oblique left (2 of 2)]
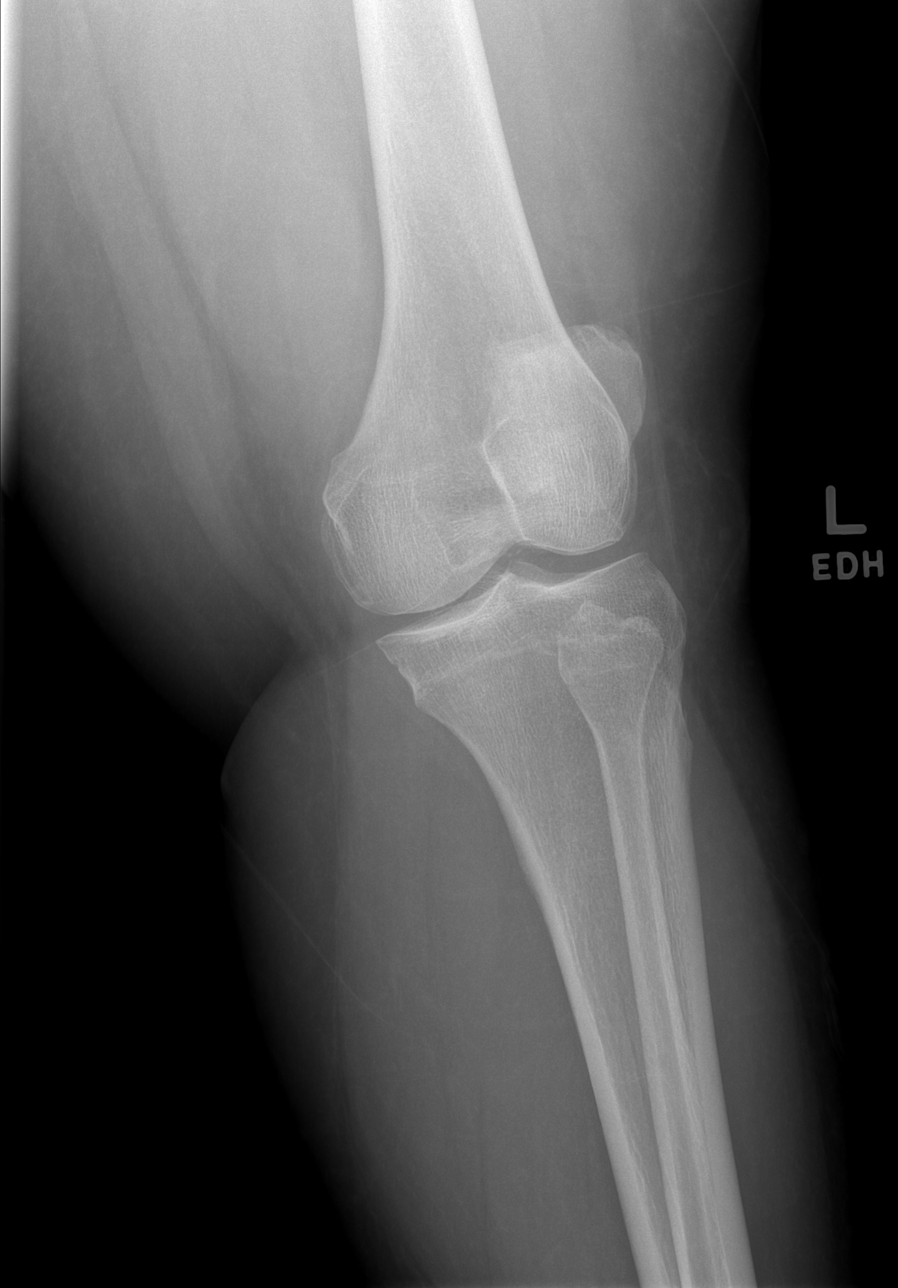

[t knee lat left]
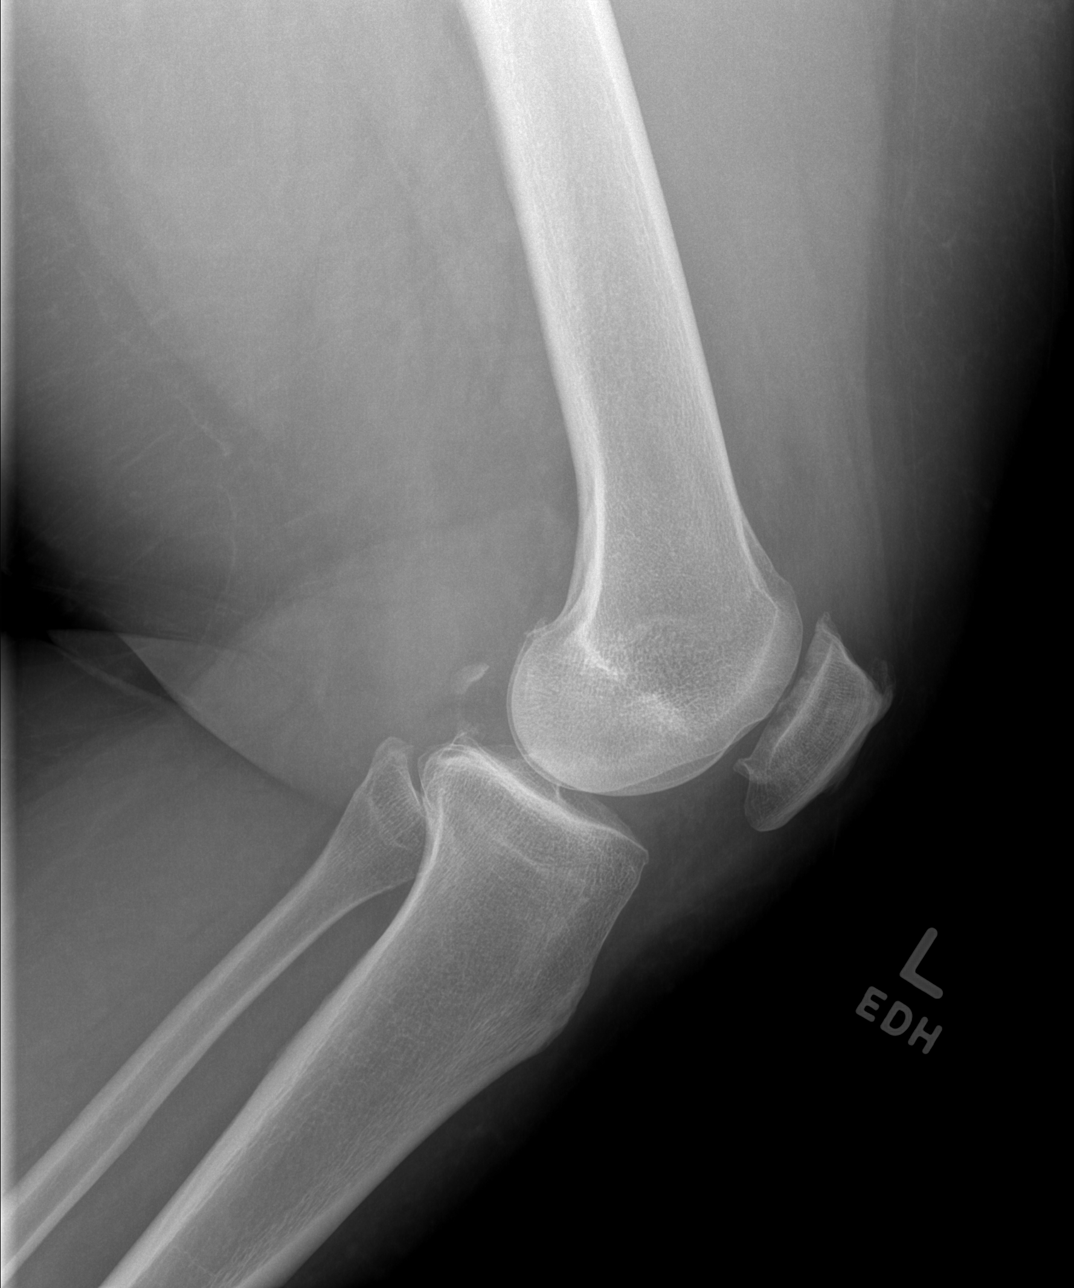

[4 of 4 positions shown; findings below may reference images not displayed]

FINDINGS: Small spurs at margin of medial compartment and patellofemoral
joint.

Osseous mineralization normal.

Joint space narrowing greatest at patellofemoral joint.

No fracture, dislocation, or bone destruction.

No joint effusion.
IMPRESSION: Mild degenerative changes LEFT knee.

No acute abnormalities.

## 2019-08-17 ENCOUNTER — Ambulatory Visit
Admission: RE | Admit: 2019-08-17 | Discharge: 2019-08-17 | Disposition: A | Discharge status: Home / Self Care | Payer: Medicare Other | Source: Ambulatory Visit | Attending: Internal Medicine | Admitting: Internal Medicine

## 2019-08-17 ENCOUNTER — Other Ambulatory Visit: Payer: Self-pay

## 2019-08-17 DIAGNOSIS — Z1231 Encounter for screening mammogram for malignant neoplasm of breast: Secondary | ICD-10-CM

## 2019-08-17 IMAGING — MG DIGITAL SCREENING BILAT W/ TOMO W/ CAD
6 of 10 series · 6 of 30 positions shown · non-contrast
Comparison: Previous exam(s).

CLINICAL DATA: Screening.

EXAM:
DIGITAL SCREENING BILATERAL MAMMOGRAM WITH TOMO AND CAD

[L CC synth-2D]
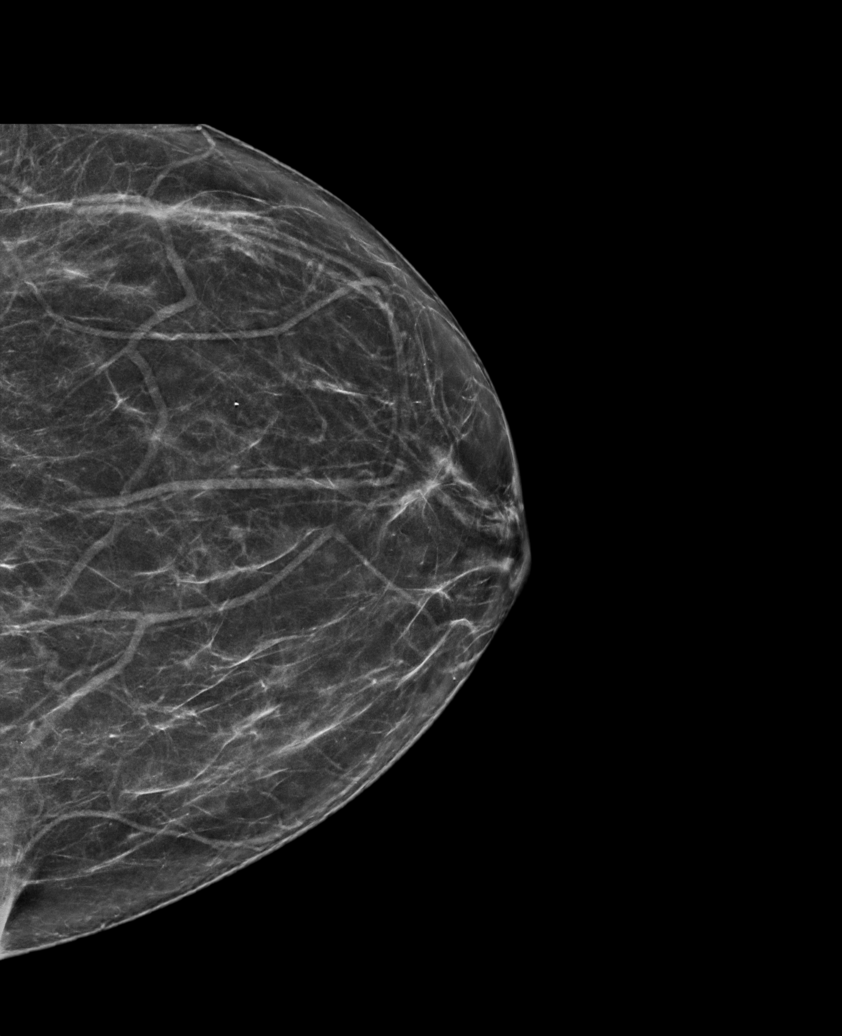

[R CC synth-2D]
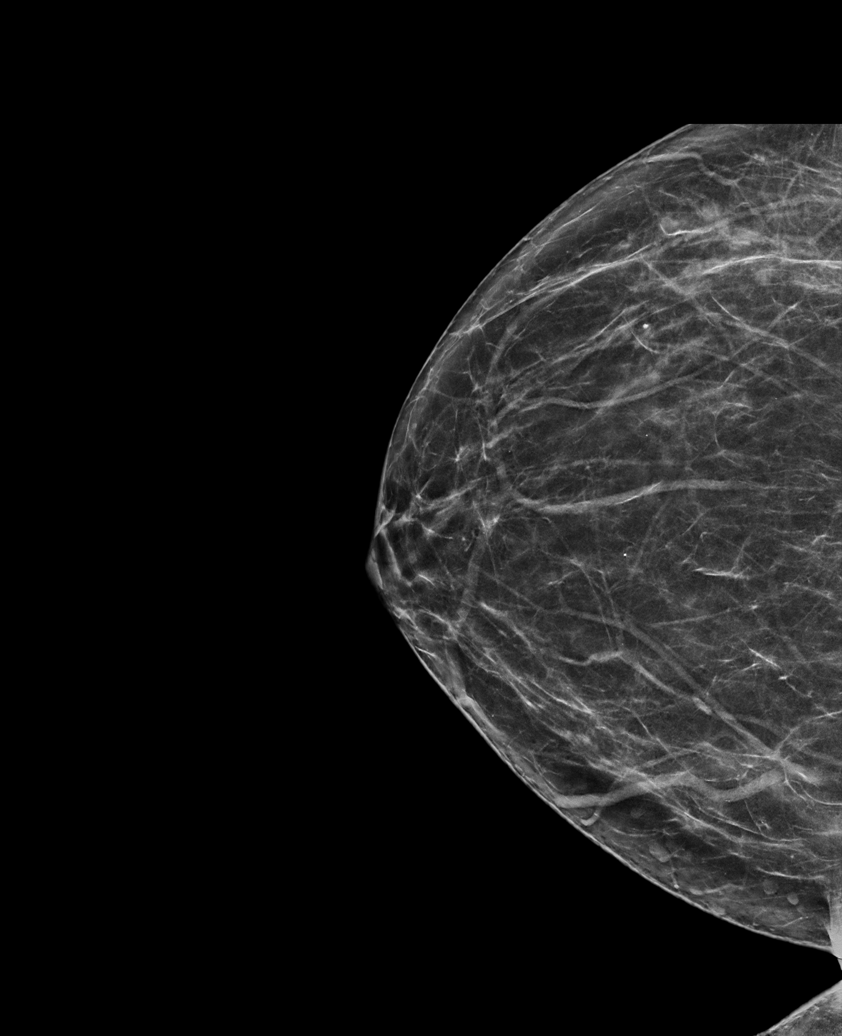

[R MLO synth-2D (1 of 2)]
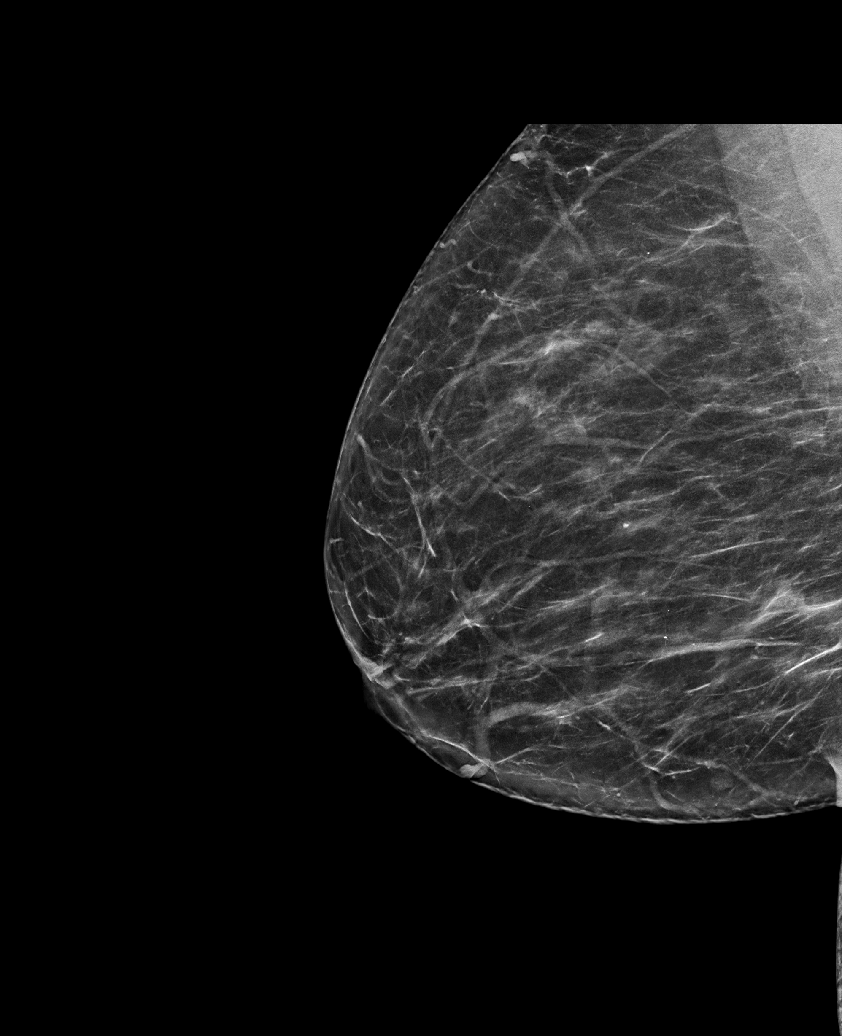

[L MLO synth-2D]
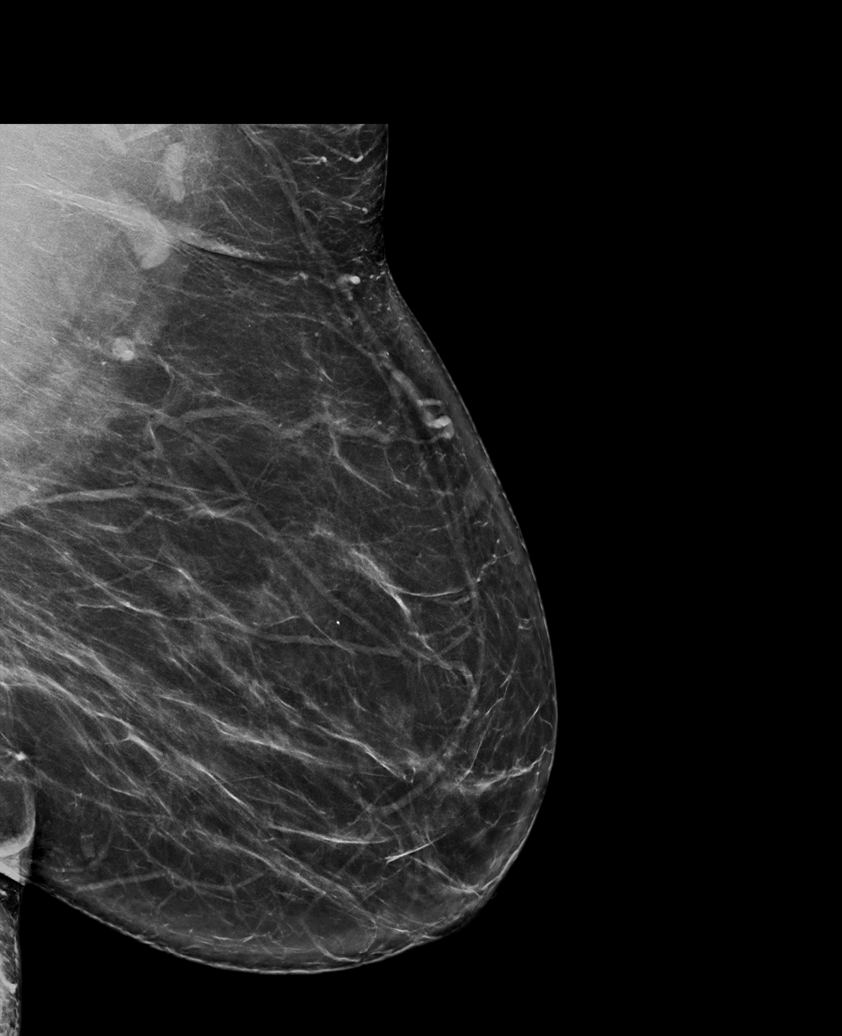

[R MLO synth-2D (2 of 2)]
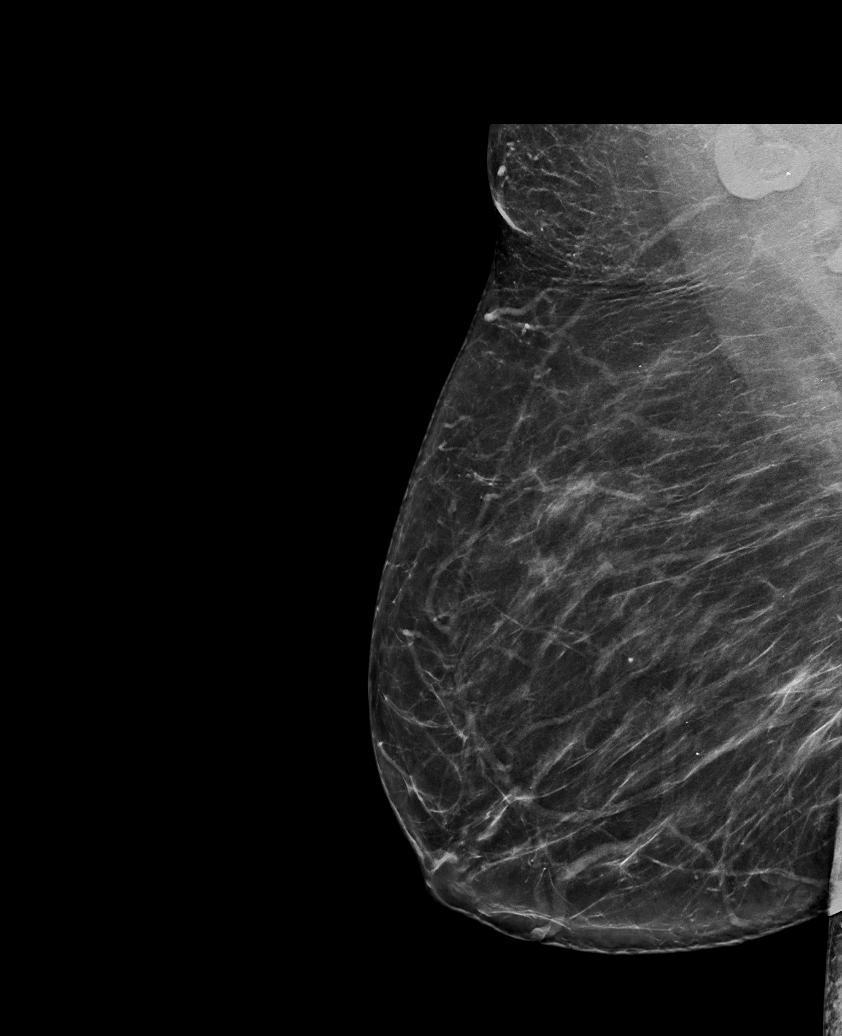

[L CC tomo · tomo slice 34/67.0]
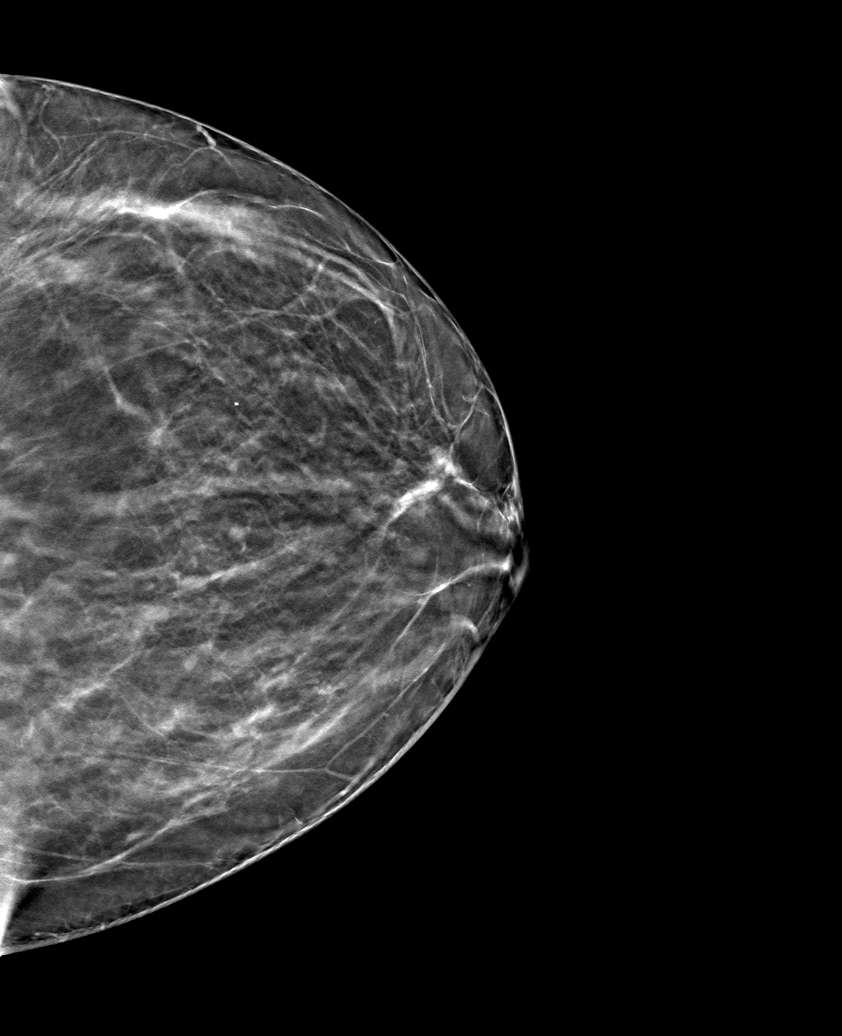

[6 of 30 positions shown; findings below may reference images not displayed]

ACR Breast Density Category b: There are scattered areas of
fibroglandular density.
FINDINGS: There are no findings suspicious for malignancy. Images were
processed with CAD.
IMPRESSION: No mammographic evidence of malignancy. A result letter of this
screening mammogram will be mailed directly to the patient.

RECOMMENDATION:
Screening mammogram in one year. (Code:[TQ])

BI-RADS CATEGORY  1: Negative.

## 2019-09-01 ENCOUNTER — Telehealth: Payer: Self-pay | Admitting: *Deleted

## 2019-09-01 NOTE — Telephone Encounter (Signed)
Patient called and left message in triage requesting lab results. I called patient and received her voicemail and left detailed message no results from visit on 08/08/19.

## 2019-09-18 ENCOUNTER — Telehealth: Payer: Self-pay | Admitting: *Deleted

## 2019-09-18 NOTE — Telephone Encounter (Signed)
Patient called c/o breast pain requesting orders sent to breast center. I called patient and left detailed message on cell she will need to schedule office visit with provider first.

## 2019-11-06 ENCOUNTER — Ambulatory Visit: Payer: Medicare Other | Admitting: Nurse Practitioner

## 2019-11-06 ENCOUNTER — Encounter: Payer: Self-pay | Admitting: Nurse Practitioner

## 2019-11-06 ENCOUNTER — Telehealth: Payer: Self-pay | Admitting: *Deleted

## 2019-11-06 ENCOUNTER — Other Ambulatory Visit: Payer: Self-pay

## 2019-11-06 DIAGNOSIS — N644 Mastodynia: Secondary | ICD-10-CM

## 2019-11-06 NOTE — Telephone Encounter (Signed)
Patient called left message in triage voicemail c/o left breast pain requesting a order be [facility] to [facility]. I called to relay our office protocol if breast problem will need to schedule office visit with provider this message was left on her voicemail.

## 2019-11-06 NOTE — Telephone Encounter (Addendum)
Orders placed at breast center. Scheduled on 11/22/19 @ 9:00am.  Sent my chart message

## 2019-11-06 NOTE — Telephone Encounter (Signed)
Error

## 2019-11-06 NOTE — Telephone Encounter (Signed)
-----   Message from Tamela Gammon, NP sent at 11/06/2019 12:40 PM EDT ----- Please send referral for diagnostic ultrasound of left breast for pain/tenderness.

## 2019-11-06 NOTE — Progress Notes (Signed)
   Acute Office Visit  Subjective:    Patient ID: Bianca Medina, female    DOB: [birth year], 69 y.o.   MRN: [id number]  Chief Complaint  Patient presents with  . breast exam    ml    HPI 68 year old presents for left breast pain that began a few months ago. She has had episodes of pain in the past with negative screenings. Had screening 3D mammogram 08/17/2019 that did not show any findings suspicious for malignancy.  She reports pain increasing in the past week and is located in the upper outer region.  She has not noticed a lump, describes the pain as tender/sore with palpation and bending over.  No personal or first degree relative with history of breast cancer.  She also complains of being easily bruised. Postmenopausal/TAH BSO - no HRT.   Review of Systems  Constitutional: Negative.   Respiratory: Negative.   Cardiovascular: Negative.   Hematological: Bruises/bleeds easily.  Breast: Left breast: positive for pain/tenderness, negative for mass, nipple discharge, or swelling  Right breast: negative      Objective:    Physical Exam Constitutional:      Appearance: She is obese.  Chest:     Breasts:        Right: Normal.        Left: Tenderness (upper outer region with palpation) present. No swelling, bleeding, inverted nipple, mass, nipple discharge or skin change.  Lymphadenopathy:     Upper Body:     Right upper body: No supraclavicular or axillary adenopathy.     Left upper body: No supraclavicular or axillary adenopathy.     There were no vitals taken for this visit. Wt Readings from Last 3 Encounters:  08/08/19 261 lb (118.4 kg)  06/23/19 264 lb (119.7 kg)  08/03/18 270 lb (122.5 kg)        Assessment & Plan:   Problem List Items Addressed This Visit    None    Visit Diagnoses    Pain of left breast    -  Primary     Plan: We will send referral for diagnostic mammogram/ultrasound of left breast due to months of pain and worsening pain in the last week.   Reassurance provided that breast cancer is not typically painful.  Patient is agreeable to plan.     Tamela Gammon [facility], 12:35 PM 11/06/2019

## 2019-11-09 NOTE — Telephone Encounter (Signed)
Patient has not read message, left detailed message on cell per DPR access.

## 2019-11-10 ENCOUNTER — Telehealth: Payer: Self-pay | Admitting: *Deleted

## 2019-11-10 NOTE — Telephone Encounter (Signed)
Patient called requesting urine results from recent visit on 11/06/19, no urine was ordered at this visit. I called to explain to patient, however her voicemail is full.

## 2019-11-16 HISTORY — PX: COLONOSCOPY: SHX174

## 2019-11-19 ENCOUNTER — Other Ambulatory Visit: Payer: Self-pay

## 2019-11-19 ENCOUNTER — Emergency Department (HOSPITAL_COMMUNITY)
Admission: EM | Admit: 2019-11-19 | Discharge: 2019-11-21 | Disposition: A | Discharge status: Other Acute Inpatient Hospital | Payer: Medicare Other | Attending: Emergency Medicine | Admitting: Emergency Medicine

## 2019-11-19 DIAGNOSIS — F29 Unspecified psychosis not due to a substance or known physiological condition: Secondary | ICD-10-CM

## 2019-11-19 DIAGNOSIS — I10 Essential (primary) hypertension: Secondary | ICD-10-CM | POA: Insufficient documentation

## 2019-11-19 DIAGNOSIS — F200 Paranoid schizophrenia: Secondary | ICD-10-CM | POA: Diagnosis present

## 2019-11-19 DIAGNOSIS — Z7984 Long term (current) use of oral hypoglycemic drugs: Secondary | ICD-10-CM | POA: Insufficient documentation

## 2019-11-19 DIAGNOSIS — Z79899 Other long term (current) drug therapy: Secondary | ICD-10-CM | POA: Diagnosis not present

## 2019-11-19 DIAGNOSIS — Z20822 Contact with and (suspected) exposure to covid-19: Secondary | ICD-10-CM | POA: Diagnosis not present

## 2019-11-19 DIAGNOSIS — E119 Type 2 diabetes mellitus without complications: Secondary | ICD-10-CM | POA: Insufficient documentation

## 2019-11-19 DIAGNOSIS — J189 Pneumonia, unspecified organism: Secondary | ICD-10-CM

## 2019-11-20 ENCOUNTER — Emergency Department (HOSPITAL_COMMUNITY): Payer: Medicare Other

## 2019-11-20 ENCOUNTER — Other Ambulatory Visit: Payer: Self-pay

## 2019-11-20 ENCOUNTER — Encounter (HOSPITAL_COMMUNITY): Payer: Self-pay | Admitting: Emergency Medicine

## 2019-11-20 LAB — CBC
HCT: 37.4 % (ref 36.0–46.0)
Hemoglobin: 12.3 g/dL (ref 12.0–15.0)
MCH: 25 pg — ABNORMAL LOW (ref 26.0–34.0)
MCHC: 32.9 g/dL (ref 30.0–36.0)
MCV: 76 fL — ABNORMAL LOW (ref 80.0–100.0)
Platelets: 316 10*3/uL (ref 150–400)
RBC: 4.92 MIL/uL (ref 3.87–5.11)
RDW: 14.2 % (ref 11.5–15.5)
WBC: 12.2 10*3/uL — ABNORMAL HIGH (ref 4.0–10.5)
nRBC: 0 % (ref 0.0–0.2)

## 2019-11-20 LAB — RAPID URINE DRUG SCREEN, HOSP PERFORMED
Amphetamines: NOT DETECTED
Barbiturates: NOT DETECTED
Benzodiazepines: NOT DETECTED
Cocaine: NOT DETECTED
Opiates: NOT DETECTED
Tetrahydrocannabinol: NOT DETECTED

## 2019-11-20 LAB — COMPREHENSIVE METABOLIC PANEL
ALT: 22 U/L (ref 0–44)
AST: 18 U/L (ref 15–41)
Albumin: 3.8 g/dL (ref 3.5–5.0)
Alkaline Phosphatase: 86 U/L (ref 38–126)
Anion gap: 11 (ref 5–15)
BUN: 22 mg/dL (ref 8–23)
CO2: 28 mmol/L (ref 22–32)
Calcium: 9.3 mg/dL (ref 8.9–10.3)
Chloride: 103 mmol/L (ref 98–111)
Creatinine, Ser: 0.97 mg/dL (ref 0.44–1.00)
GFR calc Af Amer: >60 mL/min (ref >60)
GFR calc non Af Amer: >60 mL/min (ref >60)
Glucose, Bld: 133 mg/dL — ABNORMAL HIGH (ref 70–99)
Potassium: 3.8 mmol/L (ref 3.5–5.1)
Sodium: 142 mmol/L (ref 135–145)
Total Bilirubin: 0.5 mg/dL (ref 0.3–1.2)
Total Protein: 7.5 g/dL (ref 6.5–8.1)

## 2019-11-20 LAB — URINALYSIS, COMPLETE (UACMP) WITH MICROSCOPIC
Bilirubin Urine: NEGATIVE
Glucose, UA: NEGATIVE mg/dL
Hgb urine dipstick: NEGATIVE
Ketones, ur: NEGATIVE mg/dL
Leukocytes,Ua: NEGATIVE
Nitrite: NEGATIVE
Protein, ur: NEGATIVE mg/dL
Specific Gravity, Urine: 1.014 (ref 1.005–1.030)
pH: 5 (ref 5.0–8.0)

## 2019-11-20 LAB — URINALYSIS, ROUTINE W REFLEX MICROSCOPIC
Bilirubin Urine: NEGATIVE
Glucose, UA: NEGATIVE mg/dL
Hgb urine dipstick: NEGATIVE
Ketones, ur: NEGATIVE mg/dL
Leukocytes,Ua: NEGATIVE
Nitrite: NEGATIVE
Protein, ur: NEGATIVE mg/dL
Specific Gravity, Urine: 1.024 (ref 1.005–1.030)
pH: 5 (ref 5.0–8.0)

## 2019-11-20 LAB — SARS CORONAVIRUS 2 BY RT PCR (HOSPITAL ORDER, PERFORMED IN ~~LOC~~ HOSPITAL LAB): SARS Coronavirus 2: NEGATIVE

## 2019-11-20 LAB — ETHANOL: Alcohol, Ethyl (B): <10 mg/dL (ref <10)

## 2019-11-20 LAB — SALICYLATE LEVEL: Salicylate Lvl: <7 mg/dL — ABNORMAL LOW (ref 7.0–30.0)

## 2019-11-20 LAB — ACETAMINOPHEN LEVEL: Acetaminophen (Tylenol), Serum: <10 ug/mL — ABNORMAL LOW (ref 10–30)

## 2019-11-20 IMAGING — CR DG CHEST 2V
2 series · 2 of 2 positions shown · non-contrast
Comparison: [DATE]

CLINICAL DATA: No history of chest complaints, history of asthma.

EXAM:
CHEST - 2 VIEW

[w chest pa]
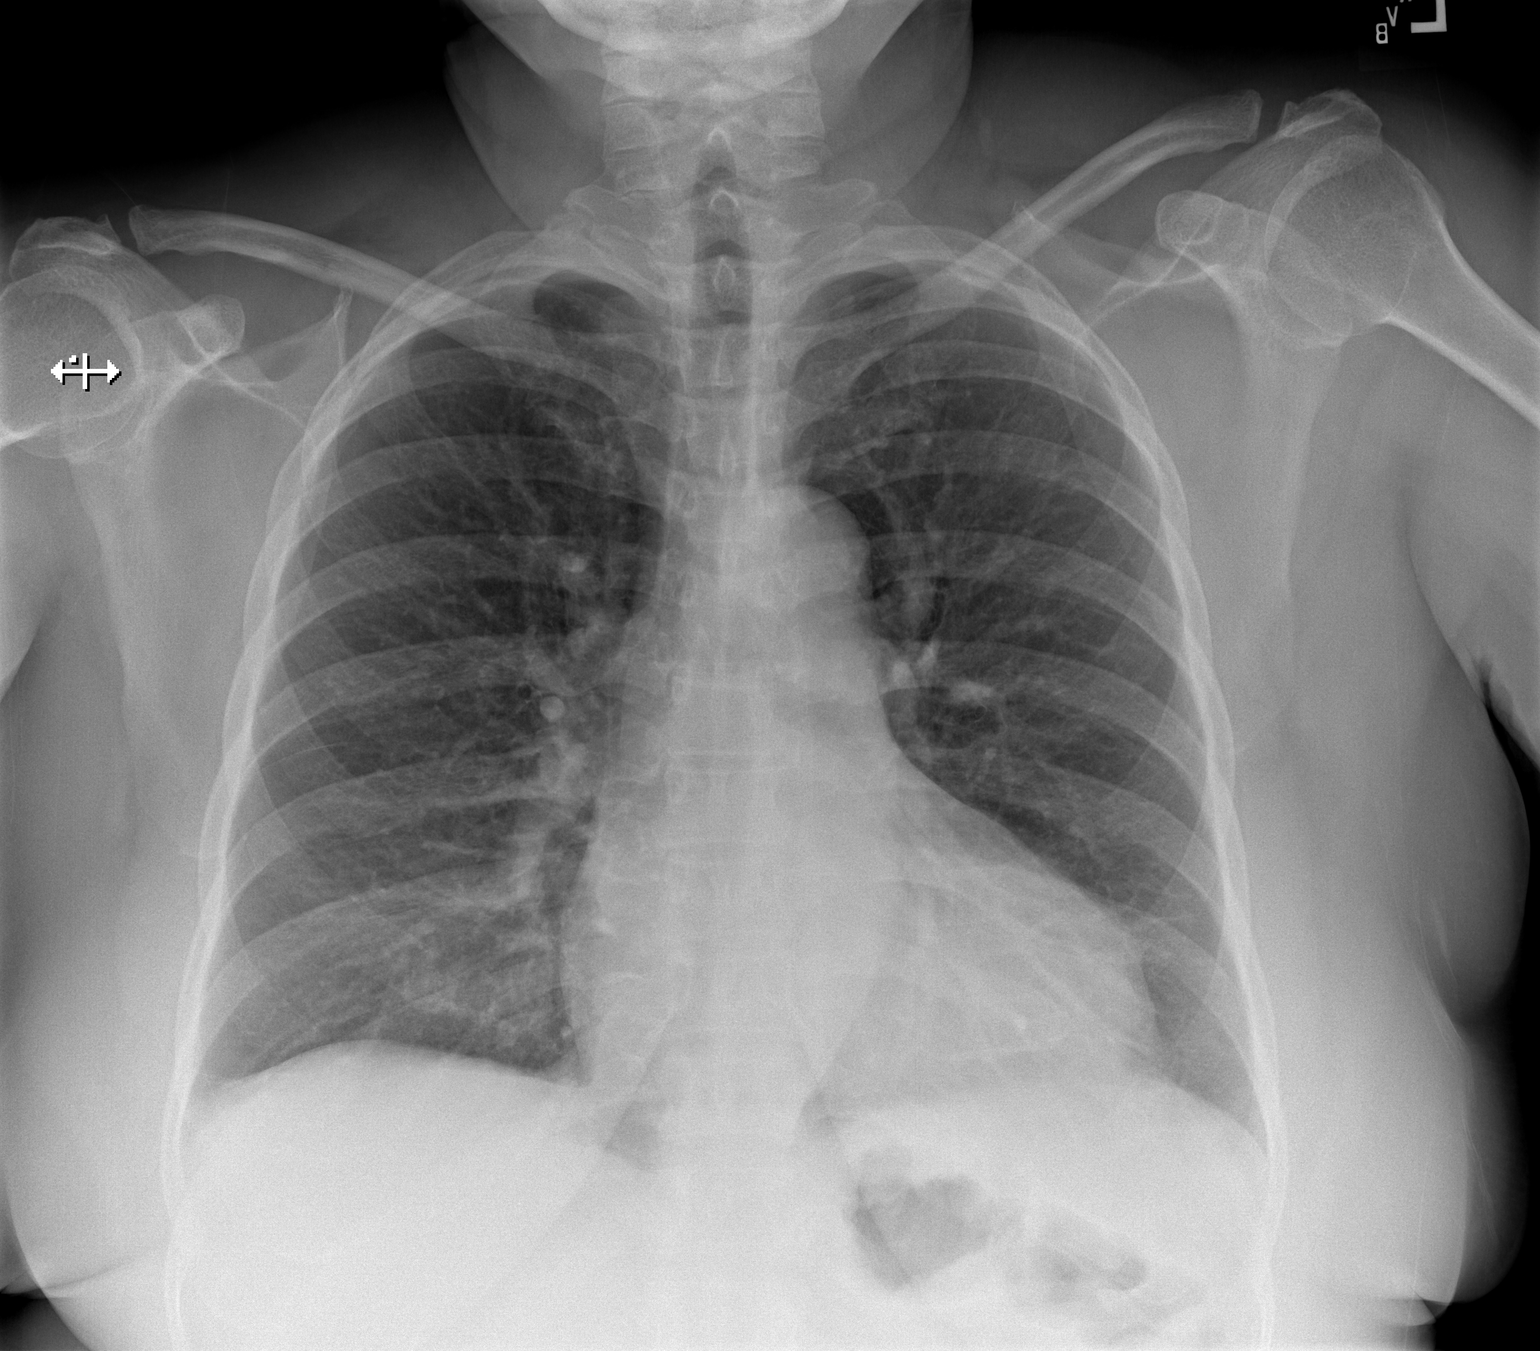

[w chest lat]
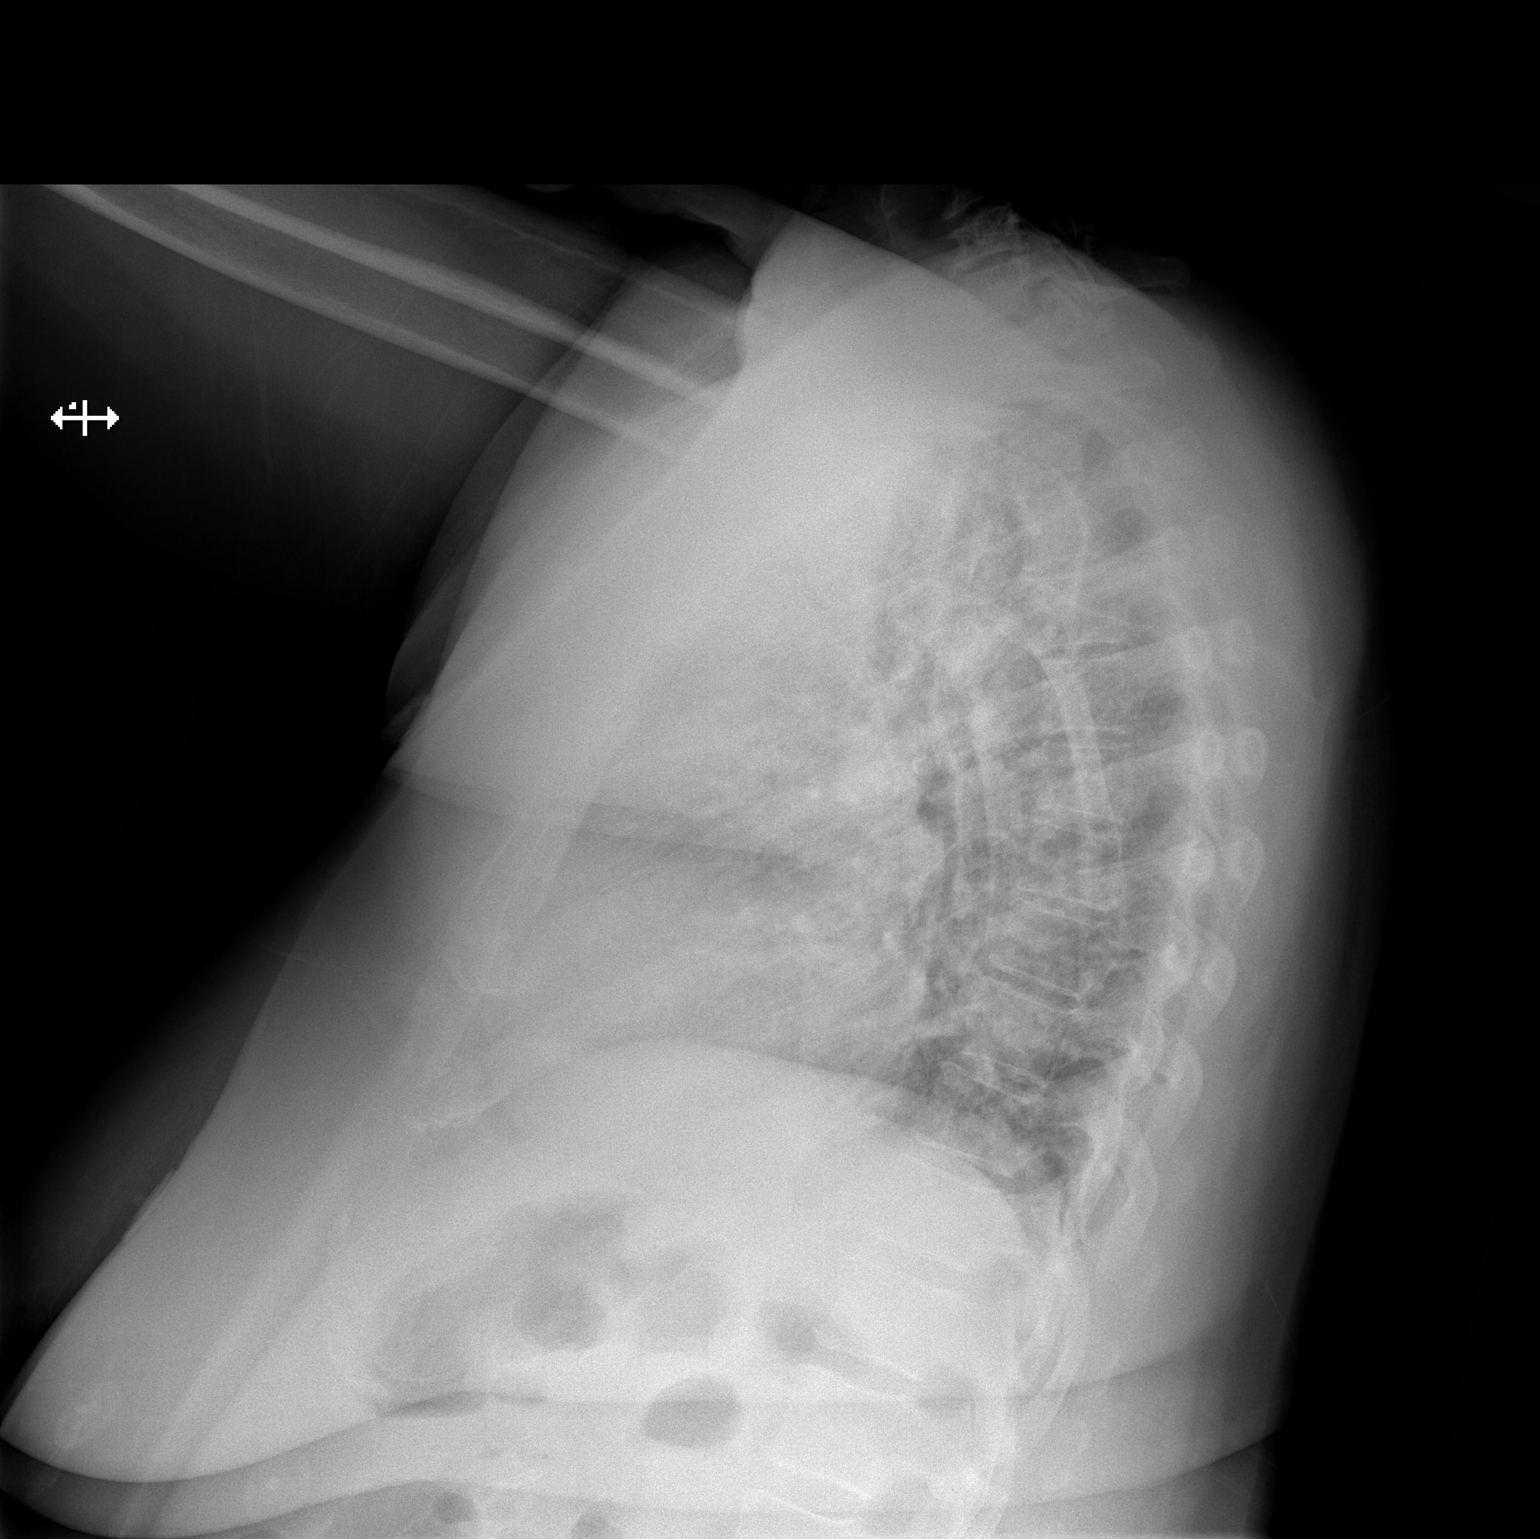

[2 of 2 positions shown; findings below may reference images not displayed]

FINDINGS: Trachea midline. Cardiomediastinal contours and hilar structures are
normal.

Platelike atelectasis in the LEFT lung base. No sign of pleural
effusion. Lateral view limited by inspiratory effort.

On limited assessment skeletal structures without acute process.
IMPRESSION: No acute cardiopulmonary disease.

## 2019-11-20 MED ORDER — LINACLOTIDE 145 MCG PO CAPS
145.0000 ug | ORAL_CAPSULE | Freq: Every day | ORAL | Status: DC
  Administered 2019-11-20: 145 ug via ORAL
  Filled 2019-11-20 (×2): qty 1

## 2019-11-20 MED ORDER — AZELASTINE HCL 0.1 % NA SOLN
1.0000 | Freq: Every day | NASAL | Status: DC
  Administered 2019-11-20: 1 via NASAL
  Filled 2019-11-20: qty 30

## 2019-11-20 MED ORDER — ADULT MULTIVITAMIN W/MINERALS CH
1.0000 | ORAL_TABLET | Freq: Every day | ORAL | Status: DC
  Administered 2019-11-20: 1 via ORAL
  Filled 2019-11-20: qty 1

## 2019-11-20 MED ORDER — PANTOPRAZOLE SODIUM 40 MG PO TBEC
80.0000 mg | DELAYED_RELEASE_TABLET | Freq: Every day | ORAL | Status: DC
  Administered 2019-11-20: 80 mg via ORAL
  Filled 2019-11-20: qty 2

## 2019-11-20 MED ORDER — LOSARTAN POTASSIUM 50 MG PO TABS
50.0000 mg | ORAL_TABLET | ORAL | Status: DC
  Administered 2019-11-20: 50 mg via ORAL
  Filled 2019-11-20 (×2): qty 1

## 2019-11-20 MED ORDER — CHLORTHALIDONE 25 MG PO TABS
25.0000 mg | ORAL_TABLET | Freq: Every day | ORAL | Status: DC
  Administered 2019-11-20: 25 mg via ORAL
  Filled 2019-11-20 (×2): qty 1

## 2019-11-20 MED ORDER — METFORMIN HCL 500 MG PO TABS
500.0000 mg | ORAL_TABLET | Freq: Every day | ORAL | Status: DC
  Administered 2019-11-20: 500 mg via ORAL
  Filled 2019-11-20: qty 1

## 2019-11-20 MED ORDER — MONTELUKAST SODIUM 10 MG PO TABS
10.0000 mg | ORAL_TABLET | Freq: Every day | ORAL | Status: DC
  Administered 2019-11-20: 10 mg via ORAL
  Filled 2019-11-20 (×2): qty 1

## 2019-11-20 MED ORDER — POTASSIUM CHLORIDE CRYS ER 10 MEQ PO TBCR
10.0000 meq | EXTENDED_RELEASE_TABLET | Freq: Every day | ORAL | Status: DC
  Administered 2019-11-20: 10 meq via ORAL
  Filled 2019-11-20: qty 1

## 2019-11-20 MED ORDER — ALBUTEROL SULFATE HFA 108 (90 BASE) MCG/ACT IN AERS: 1.0000 | INHALATION_SPRAY | RESPIRATORY_TRACT | Status: DC | PRN

## 2019-11-20 NOTE — ED Triage Notes (Signed)
Patient brought in by [employer] ivc'd. Patient told police someone was in her house. Patient hearing voices. She states people are sneaking food out of her house. Patient states she is fighting off demons. She states people are touching her while she is trying sleep.

## 2019-11-20 NOTE — ED Provider Notes (Signed)
TIME SEEN: 12:15 AM   CHIEF COMPLAINT: IVC  HPI: Patient is a 69 year old female with history of hypertension, diabetes, hyperlipidemia who presents to the emergency department under involuntary commitment taken out by her son.  Per IVC paperwork, patient reports people have been in her house and touching her while she is sleeping.  She has been "fighting off demons".  States that she is hearing voices.  Patient tells me that she has proof that people have been in her house and she has contacted the police several times because of this.  She states she thinks that her son IVC at her because his wife does not like her.  She states that she went over to their house several times to drop off presents for her grandchildren.  She states today she noticed that the house appeared dark and she only noticed that one of their dogs was barking and their cars did not appear to have moved.  She called the police for a welfare check.  She denies to me that she has ever had any psychiatric illness or been admitted to a psychiatric hospital.  She denies drug or alcohol abuse.  States she works as a Engineer, maintenance (IT).  She denies SI, HI.  She denies having hallucinations.  ROS: See HPI Constitutional: no fever  Eyes: no drainage  ENT: no runny nose   Cardiovascular:  no chest pain  Resp: no SOB  GI: no vomiting GU: no dysuria Integumentary: no rash  Allergy: no hives  Musculoskeletal: no leg swelling  Neurological: no slurred speech ROS otherwise negative  PAST MEDICAL HISTORY/PAST SURGICAL HISTORY:  Past Medical History:  Diagnosis Date  . Anemia 2003   IRON DEFICIENCY   . Asthma   . Common migraine 06/23/2019  . Diabetes mellitus without complication ([place])   . GERD (gastroesophageal reflux disease)   . Hyperlipidemia, mild   . Hypertension   . NSVD (normal spontaneous vaginal delivery)    X3  . Obesity   . PMDD (premenstrual dysphoric disorder)   . Sleep disorder     MEDICATIONS:  Prior to Admission  medications   Medication Sig Start Date End Date Taking? Authorizing Provider  Ascorbic Acid (VITAMIN C PO) Take 1 tablet by mouth daily.     [provider]  azelastine (ASTELIN) 0.1 % nasal spray Place 1 spray into both nostrils daily.  08/06/17   [provider]  Biotin 5000 MCG CAPS Take 5,000 mcg by mouth daily.     [provider]  chlorthalidone (HYGROTON) 25 MG tablet Take 25 mg by mouth daily.  04/08/16   [provider]  Cholecalciferol (VITAMIN D3) 1000 units CAPS Take 500 Units by mouth every Monday, Wednesday, and Friday.     [provider]  esomeprazole (NEXIUM) 40 MG capsule Take 40 mg by mouth daily.     [provider]  linaclotide (LINZESS) 145 MCG CAPS capsule Take 145 mcg by mouth daily before breakfast.    [provider]  losartan (COZAAR) 50 MG tablet Take 50 mg by mouth every morning.  10/13/17   [provider]  metFORMIN (GLUCOPHAGE) 500 MG tablet Take 500 mg by mouth daily.     [provider]  montelukast (SINGULAIR) 10 MG tablet Take 10 mg by mouth daily. 03/15/19   [provider]  Multiple Vitamin (MULTIVITAMIN) tablet Take 1 tablet by mouth daily.    [provider]  phentermine 37.5 MG capsule Take 1 capsule (37.5 mg total) by  mouth every morning. 08/08/19   Princess Bruins, MD  potassium chloride (MICRO-K) 10 MEQ CR capsule  05/15/19   [provider]  PROAIR HFA 108 (90 Base) MCG/ACT inhaler Inhale 1-2 puffs into the lungs every 4 (four) hours as needed for wheezing or shortness of breath.  03/16/18   [provider]  triamcinolone cream (KENALOG) 0.5 %  04/11/19   [provider]    ALLERGIES:  Allergies  Allergen Reactions  . Codeine Other (See Comments)    nightmares Other reaction(s): Other nightmares nightmares    SOCIAL HISTORY:  Social History   Tobacco Use  . Smoking status: Never Smoker  . Smokeless tobacco: Never  Used  Substance Use Topics  . Alcohol use: No    FAMILY HISTORY: Family History  Problem Relation Age of Onset  . Cancer Mother        LUNG- SMOKER  . Diabetes Father   . Hypertension Father   . Heart disease Father   . Stroke Father   . Cancer Paternal Aunt        COLON  . Cancer Paternal Uncle        COLON  . Breast cancer Sister     EXAM: BP (!) 149/63 (BP Location: Right Arm)   Pulse 62   Temp 98.3 F (36.8 C) (Oral)   Resp 17   Ht 5' 4.5" (1.638 m)   Wt 117.9 kg   SpO2 98%   BMI 43.94 kg/m  CONSTITUTIONAL: Alert and oriented and responds appropriately to questions. Well-appearing; well-nourished HEAD: Normocephalic EYES: Conjunctivae clear, pupils appear equal, EOM appear intact ENT: normal nose; moist mucous membranes NECK: Supple, normal ROM CARD: RRR; S1 and S2 appreciated; no murmurs, no clicks, no rubs, no gallops RESP: Normal chest excursion without splinting or tachypnea; breath sounds clear and equal bilaterally; no wheezes, no rhonchi, no rales, no hypoxia or respiratory distress, speaking full sentences ABD/GI: Normal bowel sounds; non-distended; soft, non-tender, no rebound, no guarding, no peritoneal signs, no hepatosplenomegaly BACK:  The back appears normal EXT: Normal ROM in all joints; no deformity noted, no edema; no cyanosis SKIN: Normal color for age and race; warm; no rash on exposed skin NEURO: Moves all extremities equally, normal speech, no facial asymmetry, normal gait  PSYCH: Poor insight.  Does not appear to be responding to internal stimuli.  Paranoid.  Denies SI or HI.  MEDICAL DECISION MAKING: Patient here under IVC.  She does endorse thinking that people are coming into her house.  She states she does have cameras in her house but states she is never able to capture them on camera.  She states she thinks they may have keys to her home.  She states this is also happened when she lived in a different location.  She is very paranoid here.   I agree that patient needs to be under IVC as she has poor insight.  She denies SI, HI and does not appear to be responding to internal stimuli.  I have completed my portion of the IVC papers.  Labs, urine pending.  ED PROGRESS: Patient's work-up here is unremarkable.  At this time she is medically cleared.  Her medications have been reordered.  5:18 AM  TTS recommends geri-pyshc placement.  They will look for placement.  The patient has been placed in psychiatric observation due to the need to provide a safe environment for the patient while obtaining psychiatric consultation and evaluation, as well as ongoing medical and medication management  to treat the patient's condition.  The patient has been placed under full IVC at this time.    NAVYA TIMMONS was evaluated in Emergency Department on 11/20/2019 for the symptoms described in the history of present illness. She was evaluated in the context of the global COVID-19 pandemic, which necessitated consideration that the patient might be at risk for infection with the SARS-CoV-2 virus that causes COVID-19. Institutional protocols and algorithms that pertain to the evaluation of patients at risk for COVID-19 are in a state of rapid change based on information released by regulatory bodies including the CDC and federal and state organizations. These policies and algorithms were followed during the patient's care in the ED.      Petrea Fredenburg, Delice Bison, DO 11/20/19 [phone]

## 2019-11-20 NOTE — BH Assessment (Addendum)
[place] Assessment Progress Note  Per Hampton Abbot, MD, this pt requires psychiatric hospitalization at this time.  Pt presents under IVC initiated by pt's son and upheld by [place], DO.  The following facilities have been contacted to seek placement for this pt, with results as noted:  Beds available, information sent, decision pending: [facility]  At capacity: [facility], [place] Coordinator [phone]

## 2019-11-20 NOTE — Consult Note (Signed)
  Patient is delusional and paranoid and needs geri psychiatric admission for stabilization and treatment. U/A, chest Xray, and EKG ordered

## 2019-11-20 NOTE — BH Assessment (Signed)
Comprehensive Clinical Assessment (CCA) Note  11/20/2019 Bianca Medina [id number]  Visit Diagnosis: F29, Unspecified schizophrenia spectrum and other psychotic disorder   CCA Screening, Triage and Referral (STR) Bianca Medina is a 69 year old patient who was involuntarily brought to [facility] by the [employer] under IVC papers that were completed by her son. The IVC paperwork states:  "Respondent called the police claiming someone is in her house. She states someone is touching her while she is trying to sleep. She is hearing voices. She states people are sneaking, taking food out of her house. She states she has to fight off demons."  When asked why pt was brought to the hospital tonight, she states, "I was served with an order; the officers brought me in. It had my son's name's on there. There are some things on there that don't make sense on there. I went by his house on two occasions - Fri and Sat - and I bought two 4th of July shirts for both of them and I took it to their house. I dropped it off. Saturday I was out and bought a toy and dropped it off and left. Today I went to see my older son and then went by my younger son's house and it didn't look like the cars had moved and so I waited in the driveway for about 15 minutes and then I left. As I left I heard something about a gunshot and a scream, so I pulled over and called the police asking for them to do a wellness check. 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 I also filed for disability for something that happened on the job--I think some of this is to block that. I think this is somehow tied together and using my family in a way to deny the award. That's where this is--I just wanted to drop these things off and left. Job problems started in 2018, family problems started mostly this year." Pt also shared that her daughter took  out a restraining order against her last year and that she took out another restraining order against her this year; pt states she believes her daughter is being controlled by a man her daughter is dating.  Pt denied SI, a hx of SI, any HI, AVH, NSSIB, access to guns/weapons, engagement with the legal system, or SA. She shares she does not currently see a therapist or a psychiatrist.  Pt's protective factors include consistent housing and family members that care about her.  Contact with pt's son, who IVCed her, was attempted at 2; clinician left a HIPAA-compliant voicemail message requesting pt's son return her call at his earliest convenience.  Pt is oriented x5. Her recent and remote memory is intact. Pt was cooperative throughout the assessment process. Pt's insight, judgement, and impulse control is fair - poor at this time.   Patient Reported Information How did you hear about [place]? Family/Friend  Referral name: Kaylla Cobos, son  Referral phone number: [phone]   [place] do you see for routine medical problems? Primary Care  Practice/Facility Name: [facility]  Practice/Facility Phone Number: No data recorded Name of Contact: No data recorded Contact Number: No data recorded Contact Fax Number: No data recorded Prescriber Name: No data recorded Prescriber Address (if known): No data recorded  What Is the Reason for Your Visit/Call Today? No data recorded How  Long Has This Been Causing You Problems? > than 6 months  What Do You Feel Would Help You the Most Today? Therapy   Have You Recently Been in Any Inpatient Treatment (Hospital/Detox/Crisis Center/28-Day Program)? No  Name/Location of Program/Hospital:No data recorded How Long Were You There? No data recorded When Were You Discharged? No data recorded  Have You Ever Received Services From [facility] Before? Yes  Who Do You See at [facility]? [facility]   Have  You Recently Had Any Thoughts About Hurting Yourself? No  Are You Planning to Commit Suicide/Harm Yourself At This time? No   Have you Recently Had Thoughts About [place]? No  Explanation: No data recorded  Have You Used Any Alcohol or Drugs in the Past 24 Hours? No  How Long Ago Did You Use Drugs or Alcohol? No data recorded What Did You Use and How Much? No data recorded  Do You Currently Have a Therapist/Psychiatrist? No  Name of Therapist/Psychiatrist: No data recorded  Have You Been Recently Discharged From Any Office Practice or Programs? No  Explanation of Discharge From Practice/Program: No data recorded    CCA Screening Triage Referral Assessment Type of Contact: Tele-Assessment  Is this Initial or Reassessment? Initial Assessment  Date Telepsych consult ordered in [facility]:  No data recorded Time Telepsych consult ordered in [facility]:  No data recorded  Patient Reported Information Reviewed? Yes  Patient Left Without Being Seen? No data recorded Reason for Not Completing Assessment: No data recorded  Collateral Involvement: No data recorded  Does Patient Have a [place]? No data recorded Name and Contact of Legal Guardian: No data recorded If Minor and Not Living with Parent(s), Who has Custody? No data recorded Is CPS involved or ever been involved? Never  Is APS involved or ever been involved? Never   Patient Determined To Be At Risk for Harm To Self or Others Based on Review of Patient Reported Information or Presenting Complaint? No data recorded Method: No data recorded Availability of Means: No data recorded Intent: No data recorded Notification Required: No data recorded Additional Information for Danger to Others Potential: No data recorded Additional Comments for Danger to Others Potential: No data recorded Are There Guns or Other Weapons in Your Home? No data recorded Types of Guns/Weapons: No data recorded Are These  Weapons Safely Secured?                            No data recorded Who Could Verify You Are Able To Have These Secured: No data recorded Do You Have any Outstanding Charges, Pending Court Dates, Parole/Probation? No data recorded Contacted To Inform of Risk of Harm To Self or Others: No data recorded  Location of Assessment: No data recorded  Does Patient Present under Involuntary Commitment? Yes  IVC Papers Initial File Date: No data recorded  [place] of Residence: [place]   Patient Currently Receiving the Following Services: No data recorded  Determination of Need: No data recorded  Options For Referral: No data recorded    CCA Biopsychosocial  Intake/Chief Complaint:     Mental Health Symptoms Depression:     Mania:     Anxiety:      Psychosis:     Trauma:     Obsessions:     Compulsions:     Inattention:     Hyperactivity/Impulsivity:     Oppositional/Defiant Behaviors:  Emotional Irregularity:     Other Mood/Personality Symptoms:      Mental Status Exam Appearance and self-care  Stature:     Weight:     Clothing:     Grooming:     Cosmetic use:     Posture/gait:     Motor activity:     Sensorium  Attention:     Concentration:     Orientation:     Recall/memory:     Affect and Mood  Affect:     Mood:     Relating  Eye contact:     Facial expression:     Attitude toward examiner:     Thought and Language  Speech flow:    Thought content:     Preoccupation:     Hallucinations:     Organization:     Transport planner of Knowledge:     Intelligence:     Abstraction:     Judgement:     Reality Testing:     Insight:     Decision Making:     Social Functioning  Social Maturity:     Social Judgement:     Stress  Stressors:     Coping Ability:     Skill Deficits:     Supports:        Religion:    Leisure/Recreation:    Exercise/Diet:     CCA Employment/Education  Employment/Work Situation:    Education:      CCA Family/Childhood History  Family and Relationship History:    Childhood History:     Child/Adolescent Assessment:     CCA Substance Use  Alcohol/Drug Use:                           ASAM's:  Six Dimensions of Multidimensional Assessment  Dimension 1:  Acute Intoxication and/or Withdrawal Potential:      Dimension 2:  Biomedical Conditions and Complications:      Dimension 3:  Emotional, Behavioral, or Cognitive Conditions and Complications:     Dimension 4:  Readiness to Change:     Dimension 5:  Relapse, Continued use, or Continued Problem Potential:     Dimension 6:  Recovery/Living Environment:     ASAM Severity Score:    ASAM Recommended Level of Treatment:     Substance use Disorder (SUD)    Recommendations for Services/Supports/Treatments: Lindon Romp, NP, reviewed pt's chart and information and determined pt meets criteria for geri-psych hospitalization. Pt's referral information will be faxed out to multiple hospitals for potential placement later this morning. This information was provided to pt's providers, Dr. Leonides Schanz and Roderic Palau, RN, via internal messenger at [phone].   DSM5 Diagnoses: Patient Active Problem List   Diagnosis Date Noted  . Common migraine 06/23/2019  . History of anemia 03/31/2019  . Pre-diabetes 03/31/2019  . Viral syndrome 03/31/2019  . Sensorineural hearing loss (SNHL), bilateral 08/12/2017  . Tinnitus of both ears 08/12/2017  . Morbid obesity ([place]) 12/28/2016  . Left breast mass 07/19/2014  . Breast tenderness in female 07/19/2014  . Menopause 01/09/2014  . Vaginal atrophy 01/09/2014  . Skin lesion 12/23/2012  . Essential hypertension 10/31/2012  . Heartburn 10/31/2012    Patient Centered Plan: Patient is on the following Treatment Plan(s):  Impulse Control   Referrals to Alternative Service(s): Referred to Alternative Service(s):   Place:   Date:   Time:    Referred to Alternative Service(s):   Place:  Date:   Time:    Referred to Alternative Service(s):   Place:   Date:   Time:    Referred to Alternative Service(s):   Place:   Date:   Time:     Dannielle Burn

## 2019-11-20 NOTE — ED Notes (Signed)
Pt given an update on plan of care as she was asking why she was here. Pt upset that she is still here and that her sons wife did this. She stated she has never been SI/HI or having any VH. She does admit to hearing things at time but not currently. Pt states we cannot hold her because she never went to court. Asked to speak to supervisor.

## 2019-11-20 NOTE — ED Notes (Signed)
Person items collected amd pt placed items in yellow hand bag/ Watch, 2 rings, shoes and keys in bag. Bag at desks desk with pt labels

## 2019-11-20 NOTE — ED Notes (Signed)
PT asked to use the phone

## 2019-11-20 NOTE — ED Notes (Signed)
Pt refused Lunch tray

## 2019-11-20 NOTE — Progress Notes (Signed)
CSW received a call from [place] at [facility] stating the patient has been offered a bed and has been accepted and that the pt can arrive on 11/21/19 after 10am .  The pt's accepting doctor is Dr. Rodman Key.  The room number will be 709-B on the Geriatric Ward.  The number for report is [phone].  RN updated.  CSW will update RN and EDP.  Alphonse Guild. Trevon Strothers, Latanya Presser, LCAS Clinical Social Worker Ph: [phone]

## 2019-11-20 NOTE — Progress Notes (Signed)
CSW received a call from [place] who is considering pt's referral and is calling to speak to pt's RN via the ED Secretary.  CSW will continue to follow for D/C needs.  Alphonse Guild. Kamden Stanislaw  MSW, LCSW, LCAS, CCS Transitions of Care Clinical Social Worker Care Coordination Department Ph: [phone]

## 2019-11-20 NOTE — ED Notes (Signed)
Pt given chicken broth and saltines.

## 2019-11-22 ENCOUNTER — Other Ambulatory Visit: Payer: BC Managed Care – PPO

## 2020-01-03 ENCOUNTER — Ambulatory Visit: Payer: Medicare Other

## 2020-01-03 ENCOUNTER — Other Ambulatory Visit: Payer: Self-pay

## 2020-01-03 ENCOUNTER — Ambulatory Visit
Admission: RE | Admit: 2020-01-03 | Discharge: 2020-01-03 | Disposition: A | Discharge status: Home / Self Care | Payer: Medicare Other | Source: Ambulatory Visit | Attending: Nurse Practitioner | Admitting: Nurse Practitioner

## 2020-01-03 DIAGNOSIS — N644 Mastodynia: Secondary | ICD-10-CM

## 2020-01-03 IMAGING — MG MM DIGITAL DIAGNOSTIC UNILAT*L* W/ TOMO W/ CAD
6 series · 6 of 18 positions shown · non-contrast
Comparison: Previous exam(s).

CLINICAL DATA: Diffuse lateral left breast pain since [DATE].
The pain is improving.

EXAM:
DIGITAL DIAGNOSTIC UNILATERAL LEFT MAMMOGRAM WITH TOMO AND CAD

[L CC synth-2D (1 of 2)]
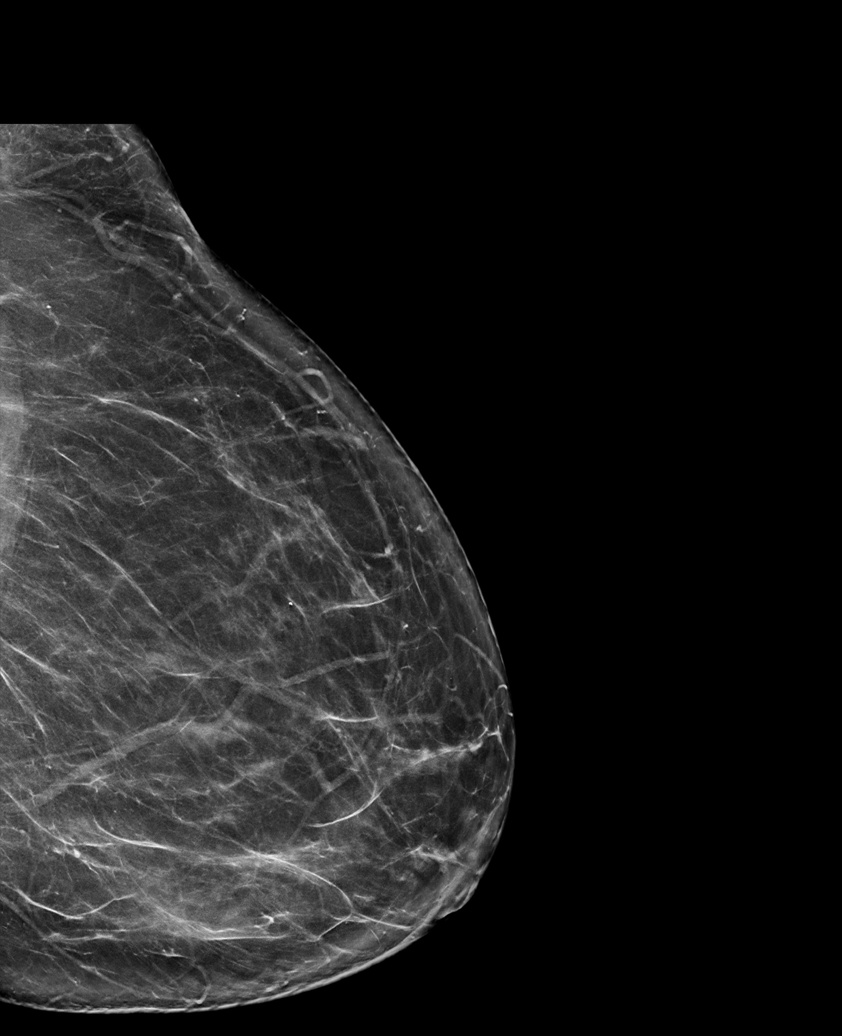

[L CC synth-2D (2 of 2)]
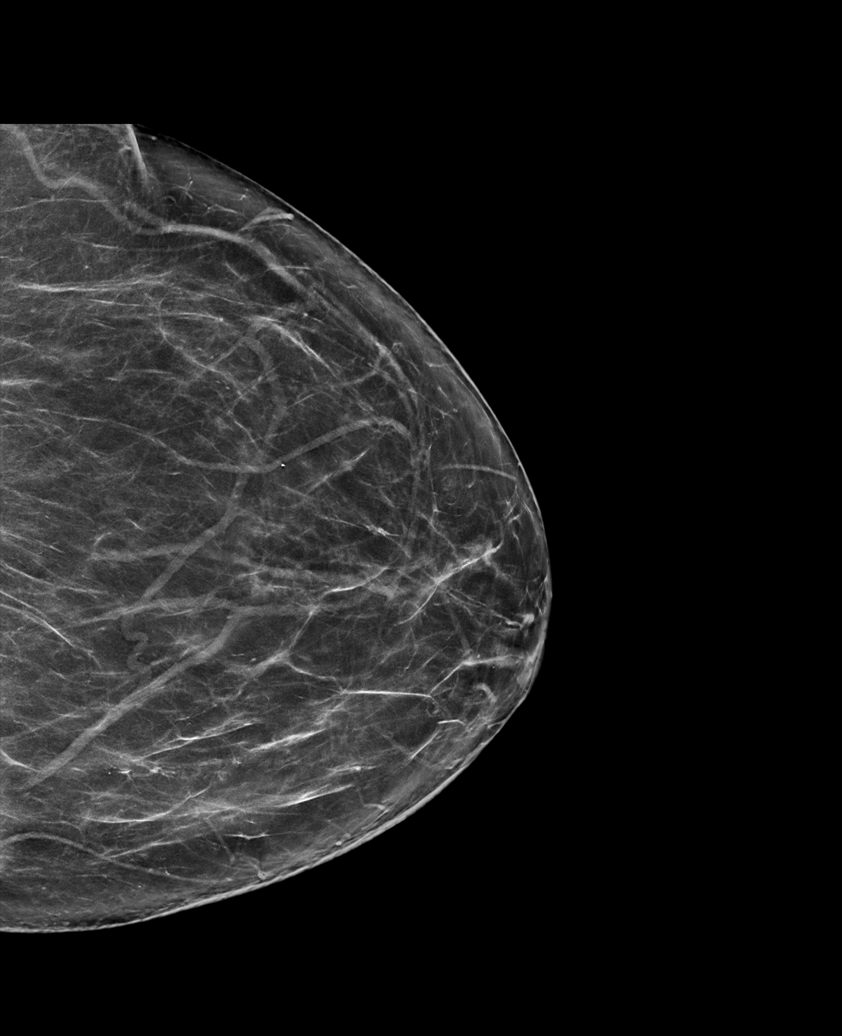

[L MLO synth-2D]
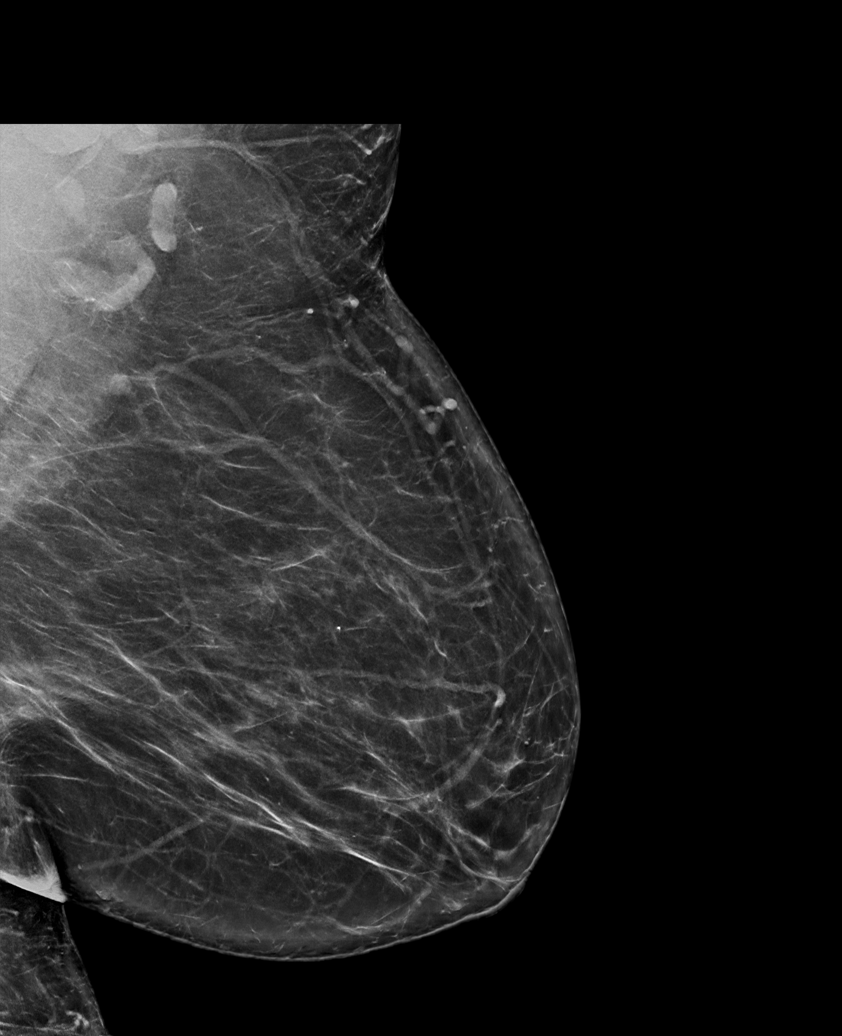

[L CC tomo (1 of 2) · tomo slice 39/77.0]
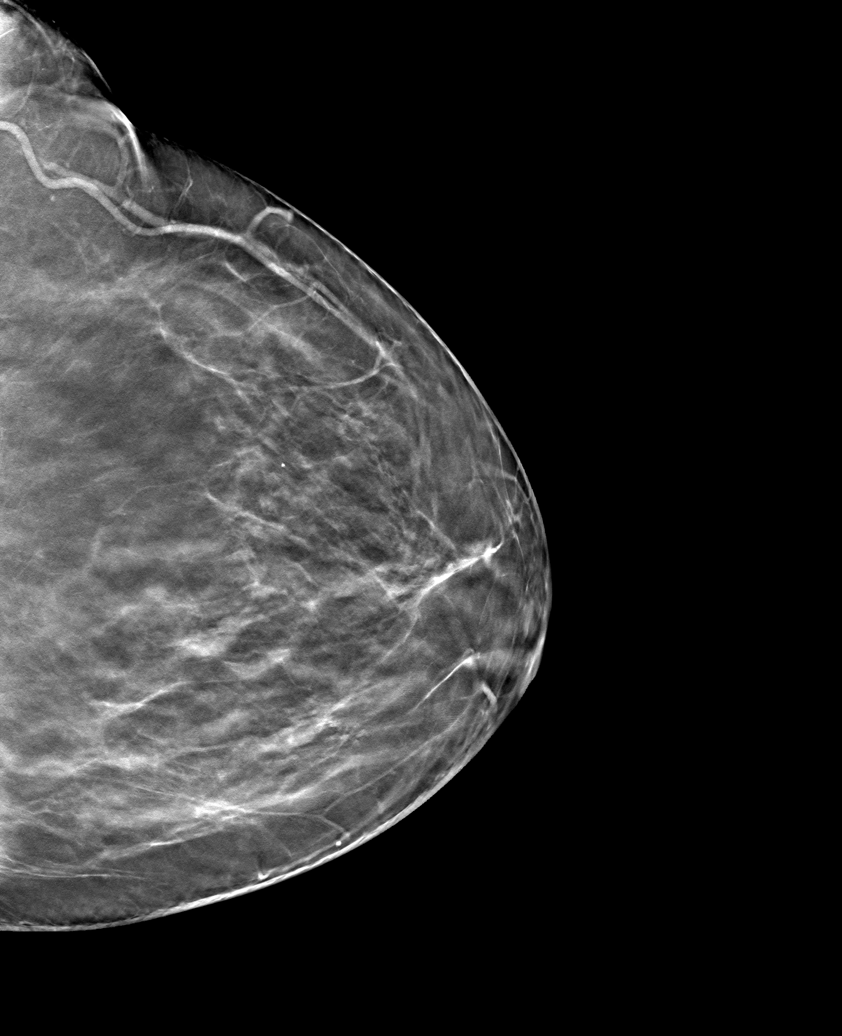

[L MLO tomo · tomo slice 45/88.0]
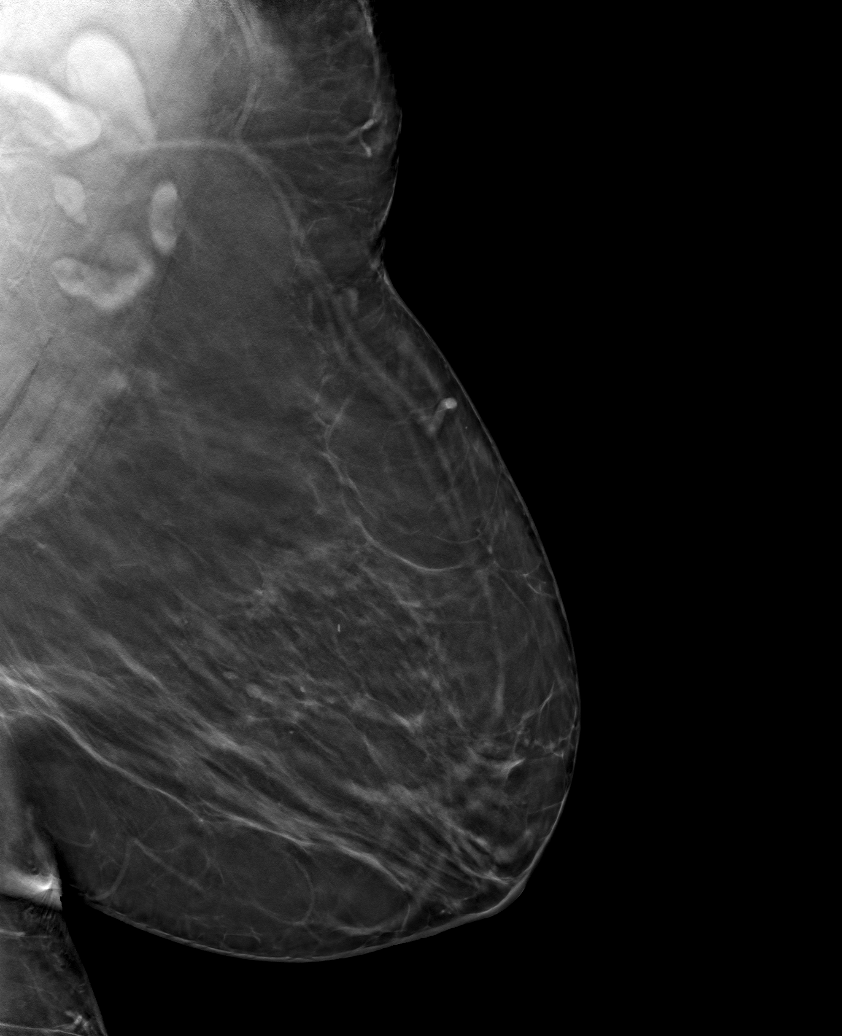

[L CC tomo (2 of 2) · tomo slice 42/83.0]
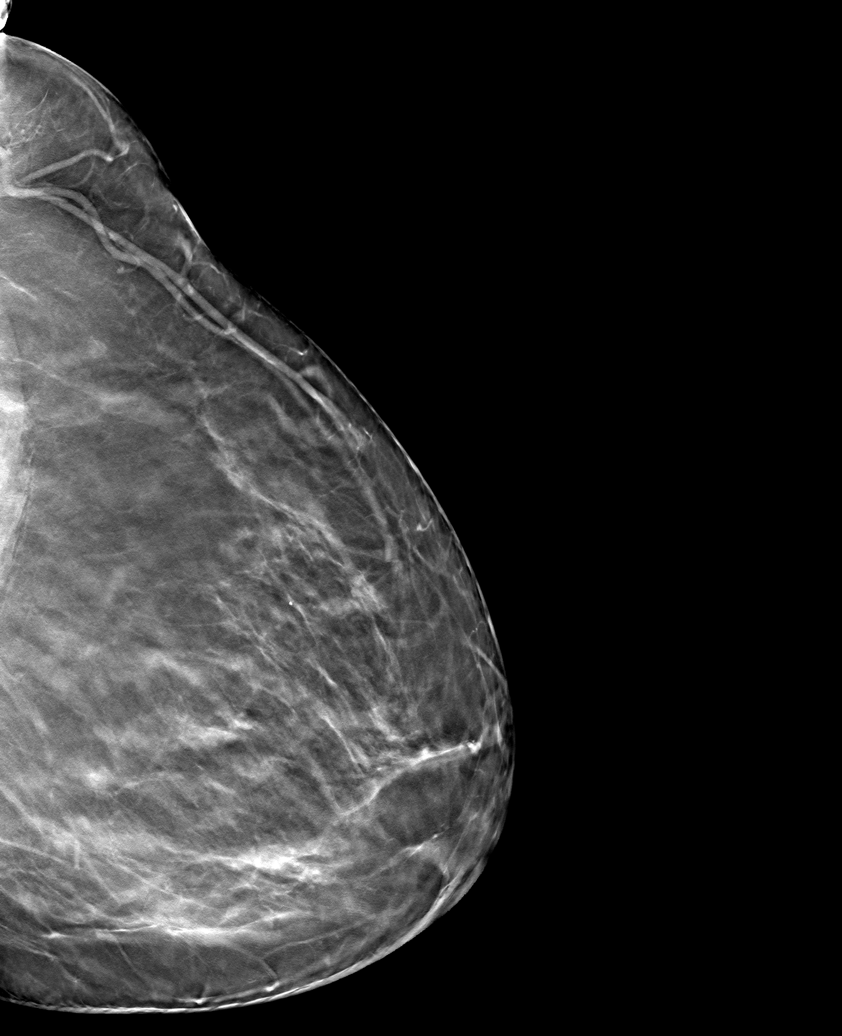

[6 of 18 positions shown; findings below may reference images not displayed]

ACR Breast Density Category b: There are scattered areas of
fibroglandular density.
FINDINGS: Stable mammographic appearance of the left breast with no findings
suspicious for malignancy.

Mammographic images were processed with CAD.
IMPRESSION: No evidence of malignancy.

RECOMMENDATION:
Bilateral screening mammogram in 8 months when due. That will be 1
year since mammographic evaluation of the right breast.

I have discussed the findings and recommendations with the patient.
If applicable, a reminder letter will be sent to the patient
regarding the next appointment.

BI-RADS CATEGORY  1: Negative.

## 2020-01-26 ENCOUNTER — Encounter: Payer: Self-pay | Admitting: Podiatry

## 2020-01-26 ENCOUNTER — Other Ambulatory Visit: Payer: Self-pay

## 2020-01-26 ENCOUNTER — Other Ambulatory Visit: Payer: Self-pay | Admitting: Podiatry

## 2020-01-26 ENCOUNTER — Ambulatory Visit (INDEPENDENT_AMBULATORY_CARE_PROVIDER_SITE_OTHER): Payer: Medicare Other | Admitting: Podiatry

## 2020-01-26 DIAGNOSIS — M79672 Pain in left foot: Secondary | ICD-10-CM

## 2020-01-26 DIAGNOSIS — M2042 Other hammer toe(s) (acquired), left foot: Secondary | ICD-10-CM | POA: Diagnosis not present

## 2020-01-26 DIAGNOSIS — S90122A Contusion of left lesser toe(s) without damage to nail, initial encounter: Secondary | ICD-10-CM

## 2020-01-26 NOTE — Progress Notes (Signed)
Subjective:   Patient ID: Bianca Medina, female   DOB: 69 y.o.   MRN: [id number]   HPI Patient presents stating that she has a dark bruise on the left side of the little toe and is concerned about the rotation of the toe and possible problems with this.  Patient states that it is not sore but she does have neuropathy   ROS      Objective:  Physical Exam  Neurovascular status intact no change health history with a significant rotation digit 5 left with dark discoloration on the outside of the toe which appears to be a blood blister formation and is not currently tender     Assessment:  Probability that this is related to the structural position of the toe with blister formation probably due to tight shoe gear     Plan:  H&P reviewed condition.  At this point I have recommended soaks and daily inspections of any drainage were to occur any redness or other pathology to let us know immediately and dispensed pad to take pressure off the toe.  Educated her on hammertoe deformity consideration for derotational arthroplasty

## 2020-05-23 ENCOUNTER — Other Ambulatory Visit: Payer: Self-pay

## 2020-05-23 ENCOUNTER — Encounter (HOSPITAL_COMMUNITY): Payer: Self-pay | Admitting: *Deleted

## 2020-05-23 ENCOUNTER — Ambulatory Visit (HOSPITAL_COMMUNITY)
Admission: EM | Admit: 2020-05-23 | Discharge: 2020-05-23 | Disposition: A | Discharge status: Home / Self Care | Payer: Medicare PPO | Attending: Internal Medicine | Admitting: Internal Medicine

## 2020-05-23 DIAGNOSIS — R600 Localized edema: Secondary | ICD-10-CM | POA: Diagnosis not present

## 2020-05-23 DIAGNOSIS — R0600 Dyspnea, unspecified: Secondary | ICD-10-CM | POA: Diagnosis not present

## 2020-05-23 DIAGNOSIS — R0609 Other forms of dyspnea: Secondary | ICD-10-CM

## 2020-05-23 NOTE — ED Provider Notes (Signed)
[place]    CSN: [id number] Arrival date & time: 05/23/20  1326      History   Chief Complaint Chief Complaint  Patient presents with  . Shortness of Breath    HPI Bianca Medina is a 70 y.o. female with PMH of asthma, DM, hypertension, hyperlipidemia presents to urgent care today with complaints of leg swelling and feeling short of breath.  Patient states she is actually feeling much better today but wanted to be evaluated.  Patient describes dyspnea on exertion x1 week.  She was able to walk [place] yesterday and only became out of breath on the [place].  Bilateral lower extremity swelling noted over the past 5 to 6 days now somewhat improved without intervention.  She does not monitor her weight.  Patient denies any recent fever or chills, headache, chest pain, orthopnea.  Patient does state she stopped taking some of her medications in December as she felt walking with helping her feel better and she was on too many medications.  She has not been able to walk as much due to cold weather.  Patient was also seen by her cardiologist last week who recommended possible echocardiogram if swelling became worse.  She is scheduled to see her PCP in February for physical exam.    Past Medical History:  Diagnosis Date  . Anemia 2003   IRON DEFICIENCY   . Asthma   . Common migraine 06/23/2019  . Diabetes mellitus without complication ([place])   . GERD (gastroesophageal reflux disease)   . Hyperlipidemia, mild   . Hypertension   . NSVD (normal spontaneous vaginal delivery)    X3  . Obesity   . PMDD (premenstrual dysphoric disorder)   . Sleep disorder     Patient Active Problem List   Diagnosis Date Noted  . Ear itching 06/27/2019  . Common migraine 06/23/2019  . History of anemia 03/31/2019  . Pre-diabetes 03/31/2019  . Viral syndrome 03/31/2019  . Sensorineural hearing loss (SNHL), bilateral 08/12/2017  . Tinnitus of both ears 08/12/2017  . Morbid obesity ([place])  12/28/2016  . Left breast mass 07/19/2014  . Breast tenderness in female 07/19/2014  . Menopause 01/09/2014  . Vaginal atrophy 01/09/2014  . Skin lesion 12/23/2012  . Essential hypertension 10/31/2012  . Heartburn 10/31/2012    Past Surgical History:  Procedure Laterality Date  . ABDOMINAL HYSTERECTOMY    . GUM SURGERY  1980's  . TUBAL LIGATION      OB History    Gravida  3   Para  3   Term      Preterm      AB      Living  3     SAB      IAB      Ectopic      Multiple      Live Births               Home Medications    Prior to Admission medications   Medication Sig Start Date End Date Taking? Authorizing Provider  azelastine (ASTELIN) 0.1 % nasal spray Place 1 spray into both nostrils daily.  08/06/17  Yes [provider]  Bioflavonoid Products (BIOFLEX PO) Take 1 tablet by mouth daily.   Yes [provider]  Biotin 5000 MCG CAPS Take 5,000 mcg by mouth daily.    Yes [provider]  Cholecalciferol (VITAMIN D3) 1000 units CAPS Take 500 Units by mouth daily.    Yes [provider]  esomeprazole (NEXIUM) 40 MG capsule Take 40 mg by mouth daily.    Yes [provider]  flurbiprofen (ANSAID) 100 MG tablet Take 100 mg by mouth 2 (two) times daily. 12/13/19  Yes [provider]  lidocaine (XYLOCAINE) 5 % ointment  06/01/19  Yes [provider]  linaclotide (LINZESS) 145 MCG CAPS capsule Take 145 mcg by mouth daily before breakfast.   Yes [provider]  Multiple Vitamin (MULTI-VITAMIN) tablet Take by mouth.   Yes [provider]  PROAIR HFA 108 (90 Base) MCG/ACT inhaler Inhale 1-2 puffs into the lungs every 4 (four) hours as needed for wheezing or shortness of breath.  03/16/18  Yes [provider]  vitamin E 1000 UNIT capsule Take 1,000 Units by mouth daily.   Yes [provider]  azithromycin (ZITHROMAX) 250 MG tablet Take 250 mg by mouth as directed. 09/14/19    [provider]  chlorthalidone (HYGROTON) 25 MG tablet Take 25 mg by mouth daily.  04/08/16   [provider]  losartan (COZAAR) 50 MG tablet Take 50 mg by mouth every morning.  10/13/17   [provider]  metFORMIN (GLUCOPHAGE) 500 MG tablet Take 500 mg by mouth daily.     [provider]  Mineral Oil-Hydrophil Petrolat OINT  06/01/19   [provider]  phentermine 37.5 MG capsule Take 1 capsule (37.5 mg total) by mouth every morning. 08/08/19   Genia Del, MD  potassium chloride (KLOR-CON) 10 MEQ tablet Take 10 mEq by mouth 2 (two) times daily.    [provider]    Family History Family History  Problem Relation Age of Onset  . Cancer Mother        LUNG- SMOKER  . Diabetes Father   . Hypertension Father   . Heart disease Father   . Stroke Father   . Cancer Paternal Aunt        COLON  . Cancer Paternal Uncle        COLON  . Breast cancer Sister     Social History Social History   Tobacco Use  . Smoking status: Never Smoker  . Smokeless tobacco: Never Used  Vaping Use  . Vaping Use: Never used  Substance Use Topics  . Alcohol use: No  . Drug use: No     Allergies   Codeine   Review of Systems As stated in HPI otherwise negative   Physical Exam Triage Vital Signs ED Triage Vitals  Enc Vitals Group     BP 05/23/20 1532 (!) 143/78     Pulse Rate 05/23/20 1532 (!) 59     Resp 05/23/20 1532 20     Temp 05/23/20 1532 97.6 F (36.4 C)     Temp Source 05/23/20 1532 Oral     SpO2 05/23/20 1532 96 %     Weight 05/23/20 1538 260 lb (117.9 kg)     Height 05/23/20 1538 5\' 4"  (1.626 m)     Head Circumference --      Peak Flow --      Pain Score 05/23/20 1538 0     Pain Loc --      Pain Edu? --      Excl. in GC? --    No data found.  Updated Vital Signs BP (!) 143/78 (BP Location: Right Arm)   Pulse (!) 59   Temp 97.6 F (36.4 C) (Oral)   Resp 20   Ht 5\' 4"  (1.626 m)  Wt 117.9 kg   SpO2 96%    BMI 44.63 kg/m   Visual Acuity Right Eye Distance:   Left Eye Distance:   Bilateral Distance:    Right Eye Near:   Left Eye Near:    Bilateral Near:     Physical Exam Constitutional:      General: She is not in acute distress.    Appearance: She is well-developed. She is obese. She is not ill-appearing or toxic-appearing.  Neck:     Vascular: No JVD.  Cardiovascular:     Rate and Rhythm: Normal rate and regular rhythm.     Heart sounds: No murmur heard. No friction rub. No gallop.      Comments: Bilateral pretibial lower extremity edema pitting to mid tibial region Pulmonary:     Effort: Pulmonary effort is normal. No tachypnea or accessory muscle usage.     Breath sounds: Normal breath sounds.     Comments: Lung sounds clear bilaterally.  No wheezes rales or crackles Abdominal:     Palpations: Abdomen is soft.  Musculoskeletal:        General: Normal range of motion.     Right lower leg: Edema present.     Left lower leg: Edema present.  Skin:    General: Skin is warm and dry.     Findings: No ecchymosis or erythema.  Neurological:     General: No focal deficit present.     Mental Status: She is alert and oriented to person, place, and time.  Psychiatric:        Mood and Affect: Mood normal.        Behavior: Behavior normal.      UC Treatments / Results  Labs (all labs ordered are listed, but only abnormal results are displayed) Labs Reviewed - No data to display  EKG   Radiology No results found.  Procedures Procedures (including critical care time)  Medications Ordered in UC Medications - No data to display  Initial Impression / Assessment and Plan / UC Course  I have reviewed the triage vital signs and the nursing notes.  Pertinent labs & imaging results that were available during my care of the patient were reviewed by me and considered in my medical decision making (see chart for details).  LE edema DOE  Patient symptoms have actually  improved and she is feeling much better today.  Symptoms felt related to mild volume overload though no overt heart failure on exam.  Vital signs stable and patient is nontoxic-appearing.  Long discussion with patient regarding compliance with all medications, but more specifically her diuretic in this situation as she has not been taking for 1 month.  Patient's EKG is reassuring with no ischemic changes.  Patient instructed to resume diuretic medication and buy compression stockings to help with swelling.  Daily weights encouraged.  Patient to call PCP in a.m. to schedule appointment.  Would likely benefit from 2D echocardiogram to assess LV function as I do not see one on file.  Follow-up precautions discussed.  Patient verifies understanding . Reviewed expections re: course of current medical issues. Questions answered. Outlined signs and symptoms indicating need for more acute intervention. Pt verbalized understanding. AVS given  Final Clinical Impressions(s) / UC Diagnoses   Final diagnoses:  Bilateral lower extremity edema  DOE (dyspnea on exertion)     Discharge Instructions     I am concerned your leg swelling and shortness of breath are a result of you not taking her  medications appropriately.  You need to be taking your chlorthalidone as prescribed.  It would also be a good idea to weigh yourself daily as we discussed and purchased compression stockings to wear.    Please call Dr. Marlou Sa tomorrow to see if you can be seen sooner for further work-up of symptoms.  You will need to go to the emergency department for any recurrent or increasing shortness of breath with leg swelling as this may be a sign of heart failure.    ED Prescriptions    None     PDMP not reviewed this encounter.   Rudolpho Sevin, NP 05/23/20 1927

## 2020-05-23 NOTE — Discharge Instructions (Addendum)
I am concerned your leg swelling and shortness of breath are a result of you not taking her medications appropriately.  You need to be taking your chlorthalidone as prescribed.  It would also be a good idea to weigh yourself daily as we discussed and purchased compression stockings to wear.    Please call Dr. August Saucer tomorrow to see if you can be seen sooner for further work-up of symptoms.  You will need to go to the emergency department for any recurrent or increasing shortness of breath with leg swelling as this may be a sign of heart failure.

## 2020-05-23 NOTE — ED Triage Notes (Signed)
Pt reports  Increased SHOB with activity a couple of weeks ago. Pt reports no SHOB with rest. Pt reports leg pain for at least 1 week. Pt also reports hear a high pitched sound.

## 2020-08-05 ENCOUNTER — Ambulatory Visit: Payer: Self-pay | Admitting: Nurse Practitioner

## 2020-08-08 ENCOUNTER — Telehealth: Payer: Self-pay | Admitting: *Deleted

## 2020-08-08 NOTE — Telephone Encounter (Signed)
Patient called and left message c/o still having issues with left breast pain called the breast center and they need a order. Patient had left breast pain and breast imaging in 12/2019. Please advise if you want to see patient again for OV or okay to place orders?

## 2020-08-09 NOTE — Telephone Encounter (Signed)
Left detailed message per DPR access.

## 2020-08-09 NOTE — Telephone Encounter (Signed)
Needs a breast exam with me first.

## 2020-08-14 ENCOUNTER — Ambulatory Visit: Payer: Medicare PPO | Admitting: Obstetrics & Gynecology

## 2020-08-14 ENCOUNTER — Telehealth: Payer: Self-pay | Admitting: *Deleted

## 2020-08-14 ENCOUNTER — Encounter: Payer: Self-pay | Admitting: Obstetrics & Gynecology

## 2020-08-14 ENCOUNTER — Other Ambulatory Visit: Payer: Self-pay

## 2020-08-14 VITALS — BP 134/86

## 2020-08-14 DIAGNOSIS — N898 Other specified noninflammatory disorders of vagina: Secondary | ICD-10-CM

## 2020-08-14 DIAGNOSIS — N644 Mastodynia: Secondary | ICD-10-CM

## 2020-08-14 LAB — WET PREP FOR TRICH, YEAST, CLUE

## 2020-08-14 NOTE — Telephone Encounter (Signed)
Patient scheduled on 09/24/20 @ 8:20 am at the breast center. Left message for patient to call.

## 2020-08-14 NOTE — Progress Notes (Signed)
    Bianca Medina [birth year] [id number]        70 y.o.  G3P3L3  RP: Left breast pain intermittently x many months  HPI: Left external lower quadrant pain intermittently x many months.  No lump felt.  No nipple discharge, no redness of the skin.  No fever.  Mammo 12/2019 Neg. Some vaginal itchiness. No pelvic pain.   OB History  Gravida Para Term Preterm AB Living  3 3       3   SAB IAB Ectopic Multiple Live Births               # Outcome Date GA Lbr Len/2nd Weight Sex Delivery Anes PTL Lv  3 Para           2 Para           1 Para             Past medical history,surgical history, problem list, medications, allergies, family history and social history were all reviewed and documented in the EPIC chart.   Directed ROS with pertinent positives and negatives documented in the history of present illness/assessment and plan.  Exam:  Vitals:   08/14/20 1111  BP: 134/86   General appearance:  Normal  Breast exam:  Rt breast normal.  Lt breast: no nodule or mass  No axillary LN felt.  Gynecologic exam: Vulva normal.  Speculum:  Cervix/vagina normal.  Secretions normal.  Wet prep done.  Wet prep Negative   Assessment/Plan:  70 y.o. G3P3   1. Breast pain, left Persistent Left external quadrant tenderness and pain, normal Lt breast exam otherwise.  Will repeat a Lt Dx mammo/US.  2. Vaginal itching Wet prep Negative.   - WET PREP FOR [place], YEAST, CLUE  Other orders - rosuvastatin (CRESTOR) 20 MG tablet; Take 20 mg by mouth daily.  Princess Bruins MD, 11:15 AM 08/14/2020

## 2020-08-14 NOTE — Telephone Encounter (Signed)
-----   Message from Princess Bruins, MD sent at 08/14/2020 11:29 AM EDT ----- Regarding: Schedule Lt Dx mammo/US Left external quadrant pain persists/Normal Lt Breast exam otherwise  Lt Dx mammo/US

## 2020-08-15 NOTE — Telephone Encounter (Signed)
Patient informed with below note. I told her to call the breast center daily/weekly to see if any sooner appointments.

## 2020-08-16 ENCOUNTER — Encounter: Payer: Self-pay | Admitting: Obstetrics & Gynecology

## 2020-09-12 ENCOUNTER — Other Ambulatory Visit: Payer: Self-pay | Admitting: Orthopedic Surgery

## 2020-09-12 DIAGNOSIS — M25561 Pain in right knee: Secondary | ICD-10-CM

## 2020-09-20 ENCOUNTER — Ambulatory Visit
Admission: RE | Admit: 2020-09-20 | Discharge: 2020-09-20 | Disposition: A | Discharge status: Home / Self Care | Payer: Medicare PPO | Source: Ambulatory Visit | Attending: Orthopedic Surgery | Admitting: Orthopedic Surgery

## 2020-09-20 ENCOUNTER — Other Ambulatory Visit: Payer: Self-pay

## 2020-09-20 DIAGNOSIS — M25561 Pain in right knee: Secondary | ICD-10-CM

## 2020-09-20 IMAGING — MR MR KNEE*R* W/O CM
4 of 6 series · 23 of 40 positions shown · non-contrast
Comparison: None.

CLINICAL DATA: Right knee pain. No known injury.

EXAM:
MRI OF THE RIGHT KNEE WITHOUT CONTRAST
TECHNIQUE: Multiplanar, multisequence MR imaging of the knee was performed. No
intravenous contrast was administered.

[Series 3: T2 fat-sat · axial · 4.0mm · 0.66mm/px · z∈[-36,+108]mm · 6 of 30 slices shown (1 of 2)]
[im 1/30]
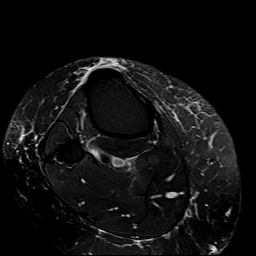
[im 6/30]
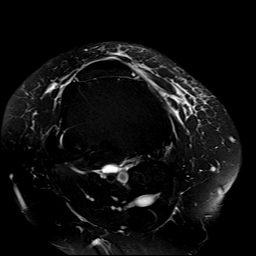
[im 12/30]
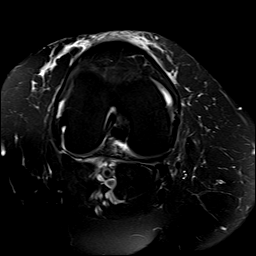
[im 18/30]
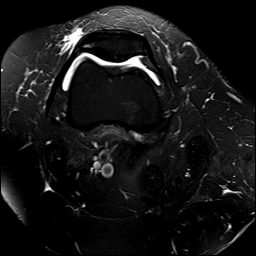
[im 24/30]
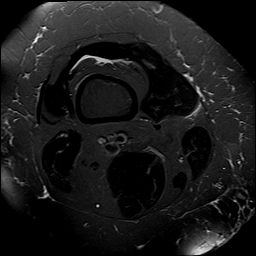
[im 30/30]
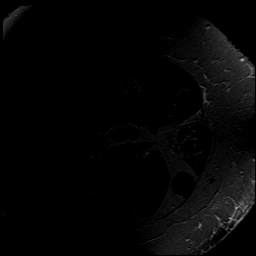

[Series 5: T2 fat-sat · coronal · 4.0mm · 0.33mm/px · 3 of 26 slices shown (2 of 2)]
[im 6/26]
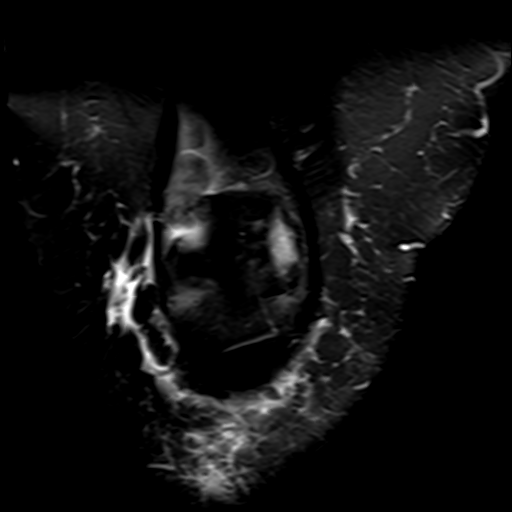
[im 16/26]
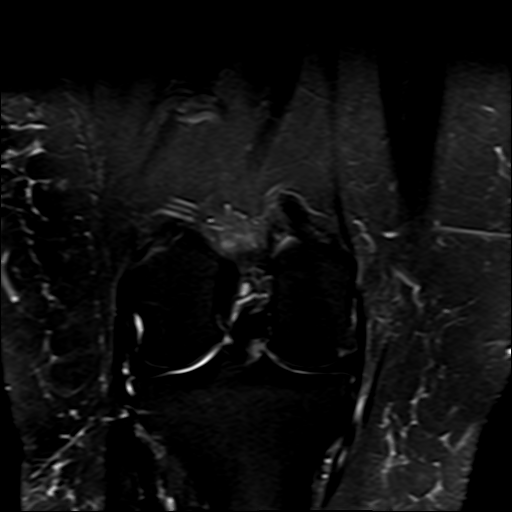
[im 26/26]
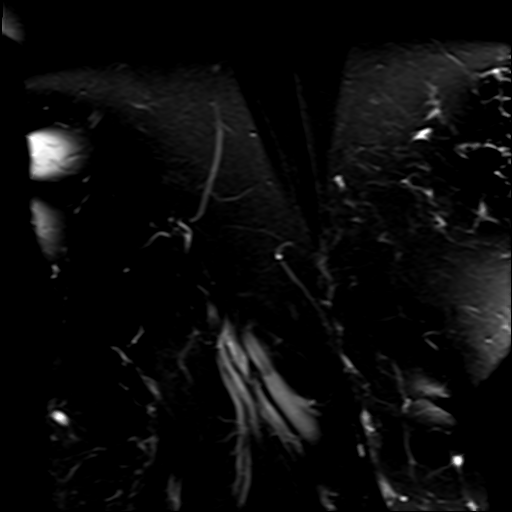

[Series 6: PD fat-sat · coronal · 4.0mm · 0.33mm/px · 6 of 26 slices shown (1 of 2)]
[im 1/26]
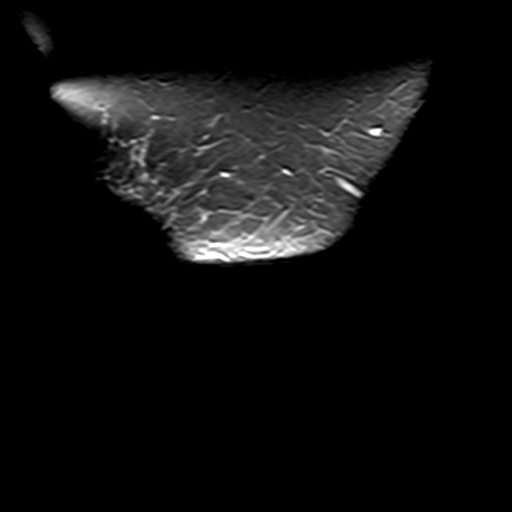
[im 6/26]
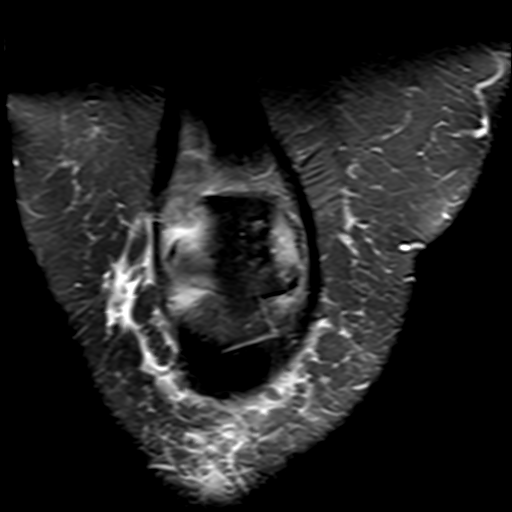
[im 11/26]
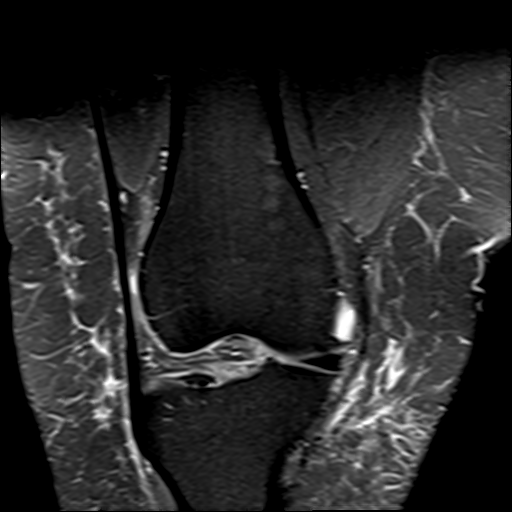
[im 16/26]
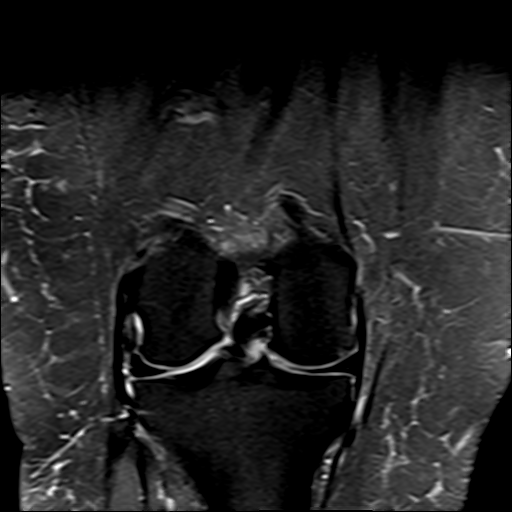
[im 21/26]
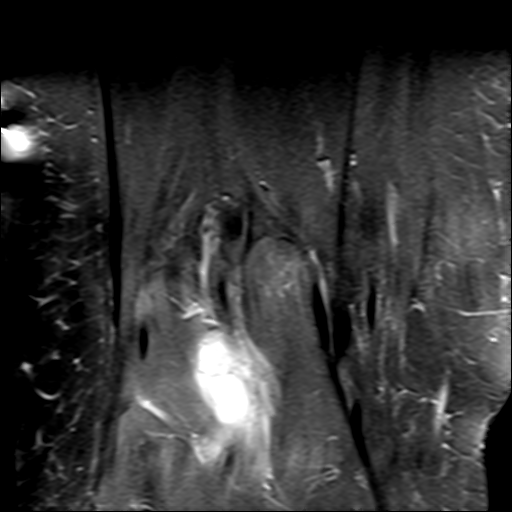
[im 26/26]
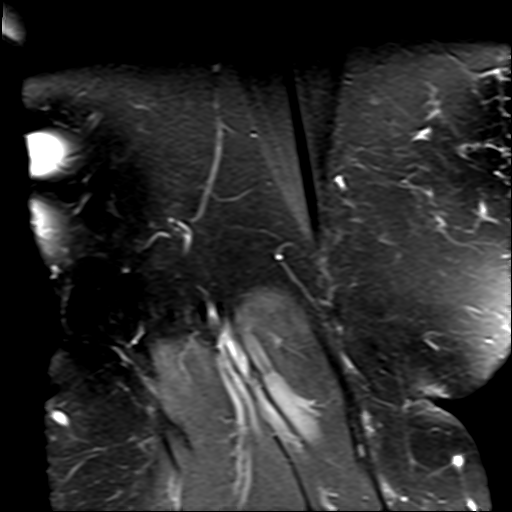

[Series 8: PD fat-sat · sagittal · 3.0mm · 0.33mm/px · 8 of 35 slices shown (2 of 2)]
[im 1/35]
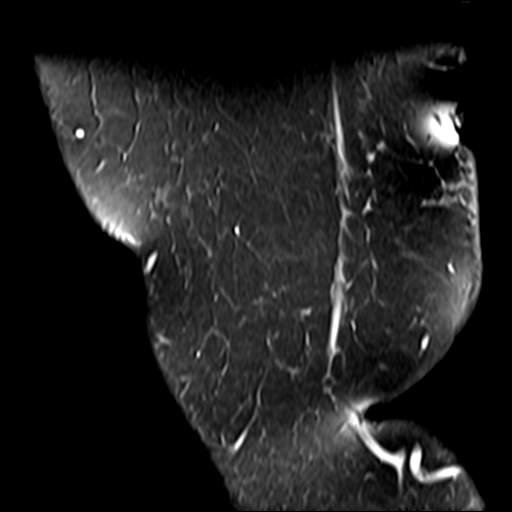
[im 5/35]
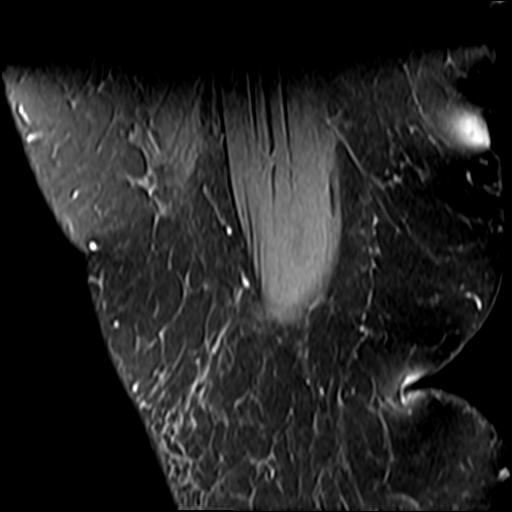
[im 10/35]
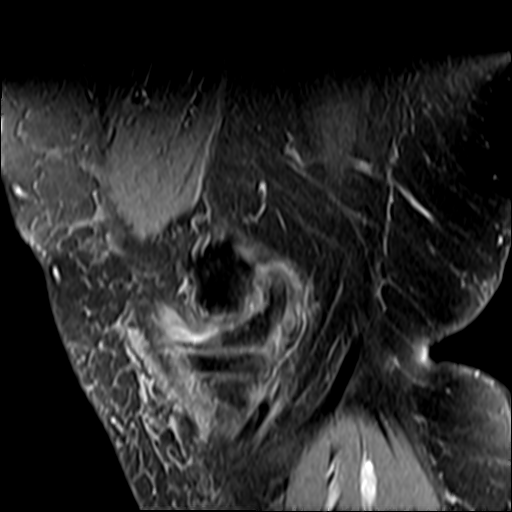
[im 15/35]
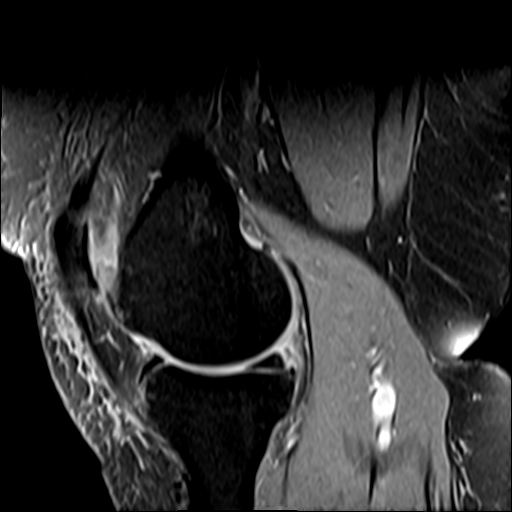
[im 20/35]
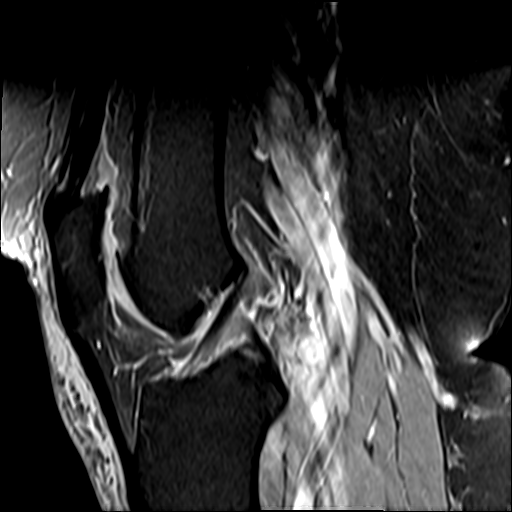
[im 25/35]
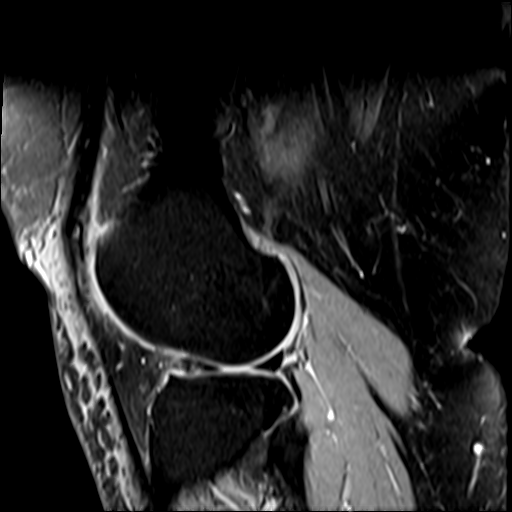
[im 30/35]
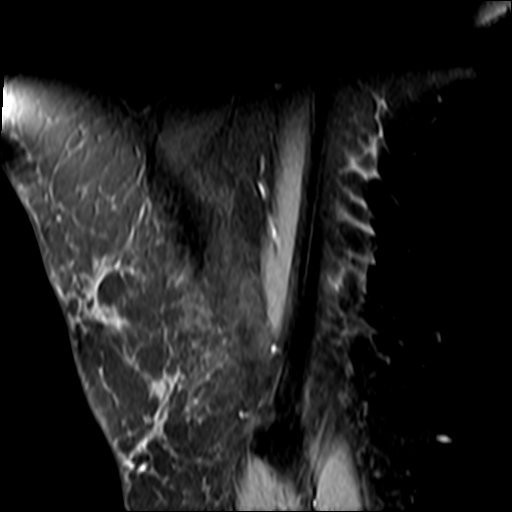
[im 35/35]
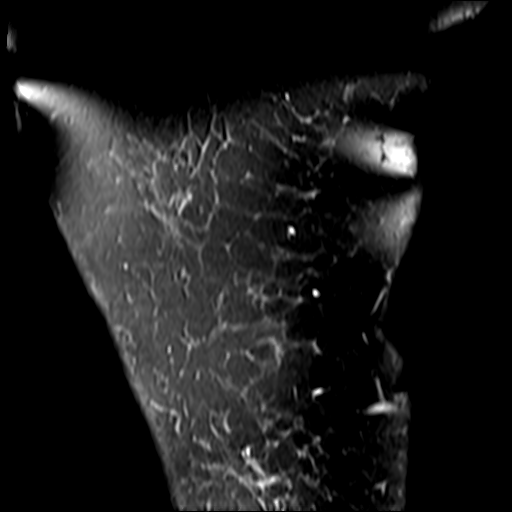

[23 of 40 positions shown; findings below may reference images not displayed]

FINDINGS: MENISCI

Medial: Intact. Mild degeneration of the posterior horn and
posterior body of the medial meniscus.

Lateral: Intact.

LIGAMENTS

Cruciates: ACL and PCL are intact.

Collaterals: Medial collateral ligament is intact. Lateral
collateral ligament complex is intact.

CARTILAGE

Patellofemoral: Full-thickness cartilage loss of the medial
patellofemoral compartment with mild subchondral reactive marrow
changes and marginal osteophytes. Partial-thickness cartilage loss
of the lateral patellofemoral compartment.

Medial: Mild partial-thickness cartilage loss of the medial
femorotibial compartment.

Lateral:  No chondral defect.

JOINT: No joint effusion. Normal LUNDE. No plical
thickening.

POPLITEAL FOSSA: Popliteus tendon is intact. No Baker's cyst.

EXTENSOR MECHANISM: Intact quadriceps tendon. Intact patellar
tendon. Intact lateral patellar retinaculum. Intact medial patellar
retinaculum. Intact MPFL.

BONES: No aggressive osseous lesion. No fracture or dislocation.

Other: No fluid collection or hematoma. Muscles are normal.
IMPRESSION: 1. Full-thickness cartilage loss of the medial patellofemoral
compartment with mild subchondral reactive marrow changes and
marginal osteophytes. Partial-thickness cartilage loss of the
lateral patellofemoral compartment.
2. Mild partial-thickness cartilage loss of the medial femorotibial
compartment.
3. No meniscal or ligamentous injury of the right knee.

## 2020-09-22 ENCOUNTER — Encounter (HOSPITAL_COMMUNITY): Payer: Self-pay

## 2020-09-22 ENCOUNTER — Ambulatory Visit (HOSPITAL_COMMUNITY)
Admission: EM | Admit: 2020-09-22 | Discharge: 2020-09-22 | Disposition: A | Discharge status: Home / Self Care | Payer: Medicare PPO | Attending: Student | Admitting: Student

## 2020-09-22 ENCOUNTER — Other Ambulatory Visit: Payer: Self-pay

## 2020-09-22 DIAGNOSIS — J4521 Mild intermittent asthma with (acute) exacerbation: Secondary | ICD-10-CM | POA: Diagnosis not present

## 2020-09-22 DIAGNOSIS — R7303 Prediabetes: Secondary | ICD-10-CM

## 2020-09-22 DIAGNOSIS — I10 Essential (primary) hypertension: Secondary | ICD-10-CM | POA: Diagnosis not present

## 2020-09-22 DIAGNOSIS — J301 Allergic rhinitis due to pollen: Secondary | ICD-10-CM | POA: Diagnosis not present

## 2020-09-22 DIAGNOSIS — H65193 Other acute nonsuppurative otitis media, bilateral: Secondary | ICD-10-CM | POA: Diagnosis not present

## 2020-09-22 MED ORDER — PREDNISONE 20 MG PO TABS: 40.0000 mg | ORAL_TABLET | Freq: Every day | ORAL | 0 refills | Status: AC

## 2020-09-22 NOTE — ED Provider Notes (Addendum)
[place]    CSN: [id number] Arrival date & time: 09/22/20  1537      History   Chief Complaint Chief Complaint  Patient presents with  . Headache  . Shortness of Breath    HPI Bianca Medina is a 70 y.o. female presenting with asthma exacerbation for the last 2 days following spending time outside.  States she has severe pollen allergies that are fairly well controlled on Claritin, but she has spent a lot of time outside and they have been acting up.  Notes shortness of breath with few headaches and some generalized weakness.  States albuterol inhaler improved symptoms.  Adamantly denies chest pain, dizziness, cough, fever/chills, n/v/d.   HPI  Past Medical History:  Diagnosis Date  . Anemia 2003   IRON DEFICIENCY   . Asthma   . Common migraine 06/23/2019  . Diabetes mellitus without complication ([place])   . GERD (gastroesophageal reflux disease)   . Hyperlipidemia, mild   . Hypertension   . NSVD (normal spontaneous vaginal delivery)    X3  . Obesity   . PMDD (premenstrual dysphoric disorder)   . Sleep disorder     Patient Active Problem List   Diagnosis Date Noted  . Ear itching 06/27/2019  . Common migraine 06/23/2019  . History of anemia 03/31/2019  . Pre-diabetes 03/31/2019  . Viral syndrome 03/31/2019  . Sensorineural hearing loss (SNHL), bilateral 08/12/2017  . Tinnitus of both ears 08/12/2017  . Morbid obesity ([place]) 12/28/2016  . Left breast mass 07/19/2014  . Breast tenderness in female 07/19/2014  . Menopause 01/09/2014  . Vaginal atrophy 01/09/2014  . Skin lesion 12/23/2012  . Essential hypertension 10/31/2012  . Heartburn 10/31/2012    Past Surgical History:  Procedure Laterality Date  . ABDOMINAL HYSTERECTOMY    . GUM SURGERY  1980's  . TUBAL LIGATION      OB History    Gravida  3   Para  3   Term      Preterm      AB      Living  3     SAB      IAB      Ectopic      Multiple      Live Births                Home Medications    Prior to Admission medications   Medication Sig Start Date End Date Taking? Authorizing Provider  predniSONE (DELTASONE) 20 MG tablet Take 2 tablets (40 mg total) by mouth daily for 5 days. 09/22/20 09/27/20 Yes Hazel Sams, PA-C  azelastine (ASTELIN) 0.1 % nasal spray Place 1 spray into both nostrils daily.  08/06/17   [provider]  Bioflavonoid Products (BIOFLEX PO) Take 1 tablet by mouth daily.    [provider]  Biotin 5000 MCG CAPS Take 5,000 mcg by mouth daily.     [provider]  chlorthalidone (HYGROTON) 25 MG tablet Take 25 mg by mouth daily.  04/08/16   [provider]  esomeprazole (NEXIUM) 40 MG capsule Take 40 mg by mouth daily.     [provider]  flurbiprofen (ANSAID) 100 MG tablet Take 100 mg by mouth 2 (two) times daily. 12/13/19   [provider]  lidocaine (XYLOCAINE) 5 % ointment  06/01/19   [provider]  linaclotide (LINZESS) 145 MCG CAPS capsule Take 145 mcg by mouth daily before breakfast.    [provider]  losartan (COZAAR) 50 MG tablet Take 50 mg by mouth every morning.  10/13/17   [provider]  metFORMIN (GLUCOPHAGE) 500 MG tablet Take 500 mg by mouth daily.     [provider]  Mineral Oil-Hydrophil Petrolat OINT  06/01/19   [provider]  Multiple Vitamin (MULTI-VITAMIN) tablet Take by mouth.    [provider]  potassium chloride (KLOR-CON) 10 MEQ tablet Take 10 mEq by mouth 2 (two) times daily.    [provider]  PROAIR HFA [phone] Base) MCG/ACT inhaler Inhale 1-2 puffs into the lungs every 4 (four) hours as needed for wheezing or shortness of breath.  03/16/18   [provider]  rosuvastatin (CRESTOR) 20 MG tablet Take 20 mg by mouth daily.    [provider]  vitamin E 1000 UNIT capsule Take 1,000 Units by mouth daily.    [provider]    Family History Family History  Problem  Relation Age of Onset  . Cancer Mother        LUNG- SMOKER  . Diabetes Father   . Hypertension Father   . Heart disease Father   . Stroke Father   . Cancer Paternal Aunt        COLON  . Cancer Paternal Uncle        COLON  . Breast cancer Sister     Social History Social History   Tobacco Use  . Smoking status: Never Smoker  . Smokeless tobacco: Never Used  Vaping Use  . Vaping Use: Never used  Substance Use Topics  . Alcohol use: No  . Drug use: No     Allergies   Codeine   Review of Systems Review of Systems  Constitutional: Negative for appetite change, chills and fever.  HENT: Positive for congestion. Negative for ear pain, rhinorrhea, sinus pressure, sinus pain and sore throat.   Eyes: Negative for redness and visual disturbance.  Respiratory: Positive for shortness of breath and wheezing. Negative for cough and chest tightness.   Cardiovascular: Negative for chest pain and palpitations.  Gastrointestinal: Negative for abdominal pain, constipation, diarrhea, nausea and vomiting.  Genitourinary: Negative for dysuria, frequency and urgency.  Musculoskeletal: Negative for myalgias.  Neurological: Negative for dizziness, weakness and headaches.  Psychiatric/Behavioral: Negative for confusion.  All other systems reviewed and are negative.    Physical Exam Triage Vital Signs ED Triage Vitals  Enc Vitals Group     BP 09/22/20 1612 (!) 172/89     Pulse Rate 09/22/20 1612 63     Resp 09/22/20 1612 (!) 26     Temp 09/22/20 1612 98.5 F (36.9 C)     Temp Source 09/22/20 1612 Oral     SpO2 09/22/20 1612 97 %     Weight --      Height --      Head Circumference --      Peak Flow --      Pain Score 09/22/20 1610 6     Pain Loc --      Pain Edu? --      Excl. in [place]? --    No data found.  Updated Vital Signs BP (!) 172/89 (BP Location: Left Arm)   Pulse 63   Temp 98.5 F (36.9 C) (Oral)   Resp (!) 26   SpO2 97%   Visual Acuity Right Eye Distance:    Left Eye Distance:   Bilateral Distance:    Right Eye Near:   Left Eye  Near:    Bilateral Near:     Physical Exam Vitals reviewed.  Constitutional:      General: She is not in acute distress.    Appearance: Normal appearance. She is not ill-appearing.  HENT:     Head: Normocephalic and atraumatic.     Right Ear: Hearing, ear canal and external ear normal. No swelling or tenderness. A middle ear effusion is present. There is no impacted cerumen. No mastoid tenderness. Tympanic membrane is not perforated, erythematous, retracted or bulging.     Left Ear: Hearing, ear canal and external ear normal. No swelling or tenderness. A middle ear effusion is present. There is no impacted cerumen. No mastoid tenderness. Tympanic membrane is not perforated, erythematous, retracted or bulging.     Nose:     Right Sinus: No maxillary sinus tenderness or frontal sinus tenderness.     Left Sinus: No maxillary sinus tenderness or frontal sinus tenderness.     Mouth/Throat:     Mouth: Mucous membranes are moist.     Pharynx: Uvula midline. No oropharyngeal exudate or posterior oropharyngeal erythema.     Tonsils: No tonsillar exudate.  Cardiovascular:     Rate and Rhythm: Normal rate and regular rhythm.     Heart sounds: Normal heart sounds.  Pulmonary:     Breath sounds: Normal breath sounds and air entry. No decreased breath sounds, wheezing, rhonchi or rales.     Comments: Lungs clear to auscultation Chest:     Chest wall: No tenderness.  Abdominal:     General: Abdomen is flat. Bowel sounds are normal.     Tenderness: There is no abdominal tenderness. There is no guarding or rebound.  Lymphadenopathy:     Cervical: No cervical adenopathy.  Skin:    Capillary Refill: Capillary refill takes less than 2 seconds.  Neurological:     General: No focal deficit present.     Mental Status: She is alert and oriented to person, place, and time.  Psychiatric:        Attention and Perception:  Attention and perception normal.        Mood and Affect: Mood and affect normal.        Behavior: Behavior normal. Behavior is cooperative.        Thought Content: Thought content normal.        Judgment: Judgment normal.      UC Treatments / Results  Labs (all labs ordered are listed, but only abnormal results are displayed) Labs Reviewed - No data to display  EKG   Radiology No results found.  Procedures Procedures (including critical care time)  Medications Ordered in UC Medications - No data to display  Initial Impression / Assessment and Plan / UC Course  I have reviewed the triage vital signs and the nursing notes.  Pertinent labs & imaging results that were available during my care of the patient were reviewed by me and considered in my medical decision making (see chart for details).     This patient is a 70 year old female presenting with asthma exacerbation related to allergic rhinitis.  She is afebrile, nontachycardic, mildly tachypneic, oxygenating well on room air with no wheezes rhonchi or rales. Low suspicion for pneumonia given clear lungs, pt oxygenating comfortably without fevers.  For asthma exacerbation, prednisone as below.  Patient is a prediabetic but states sugars are well controlled. Continue Claritin for allergic rhinitis component. For BP, continue current regimen; f/u with PCP for mediation titration.  ED return  precautions discussed.  Coding this visit a Level 4 as we discussed 4 chronic conditions, one with acute exacerbation (asthma), and prescription drug management.   Final Clinical Impressions(s) / UC Diagnoses   Final diagnoses:  Mild intermittent asthma with acute exacerbation  Seasonal allergic rhinitis due to pollen  Essential hypertension  Prediabetes  Acute effusion of both middle ears     Discharge Instructions     -Prednisone, 2 pills taken at the same time for 5 days in a row.  Try taking this earlier in the day as it  can give you energy. This will help with your breathing and ear pressure.  Try to avoid NSAIDs while on this medication like ibuprofen, as this can cause stomach upset and gastritis. -Continue albuterol inhaler as needed -Continue your allergy medication -Seek additional medical attention if you develop new symptoms like worsening shortness of breath, wheezing, chest pain, dizziness, new fevers/chills, etc.    ED Prescriptions    Medication Sig Dispense Auth. Provider   predniSONE (DELTASONE) 20 MG tablet Take 2 tablets (40 mg total) by mouth daily for 5 days. 10 tablet Hazel Sams, PA-C     PDMP not reviewed this encounter.   Hazel Sams, PA-C 09/22/20 1712    Hazel Sams, PA-C 09/22/20 1713

## 2020-09-22 NOTE — ED Triage Notes (Signed)
Pt reports headaches, wekaness and shortness of breath x 2-3 days. Denies chest pain, dizziness, cough. Pt has not taken any OTC meds  complaints.

## 2020-09-22 NOTE — Discharge Instructions (Addendum)
-  Prednisone, 2 pills taken at the same time for 5 days in a row.  Try taking this earlier in the day as it can give you energy. This will help with your breathing and ear pressure.  Try to avoid NSAIDs while on this medication like ibuprofen, as this can cause stomach upset and gastritis. -Continue albuterol inhaler as needed -Continue your allergy medication -Seek additional medical attention if you develop new symptoms like worsening shortness of breath, wheezing, chest pain, dizziness, new fevers/chills, etc.

## 2020-09-24 ENCOUNTER — Ambulatory Visit
Admission: RE | Admit: 2020-09-24 | Discharge: 2020-09-24 | Disposition: A | Discharge status: Home / Self Care | Payer: Medicare PPO | Source: Ambulatory Visit | Attending: Obstetrics & Gynecology | Admitting: Obstetrics & Gynecology

## 2020-09-24 ENCOUNTER — Other Ambulatory Visit: Payer: Self-pay

## 2020-09-24 DIAGNOSIS — N644 Mastodynia: Secondary | ICD-10-CM

## 2020-09-24 IMAGING — US US BREAST*L* LIMITED INC AXILLA
1 series · 10 of 10 positions shown · non-contrast
Comparison: Previous exam(s).

CLINICAL DATA: The patient presented for left breast pain in [DATE] which had been present since [DATE]. She presents
with continued pain.

EXAM:
DIGITAL DIAGNOSTIC BILATERAL MAMMOGRAM WITH TOMOSYNTHESIS AND CAD;
ULTRASOUND LEFT BREAST LIMITED
TECHNIQUE: Bilateral digital diagnostic mammography and breast tomosynthesis
was performed. The images were evaluated with computer-aided
detection.; Targeted ultrasound examination of the left breast was
performed

[Series 1: us breast*left* limited inc axilla · 0.05mm/px · 10 of 10 slices shown]
[im 1/10]
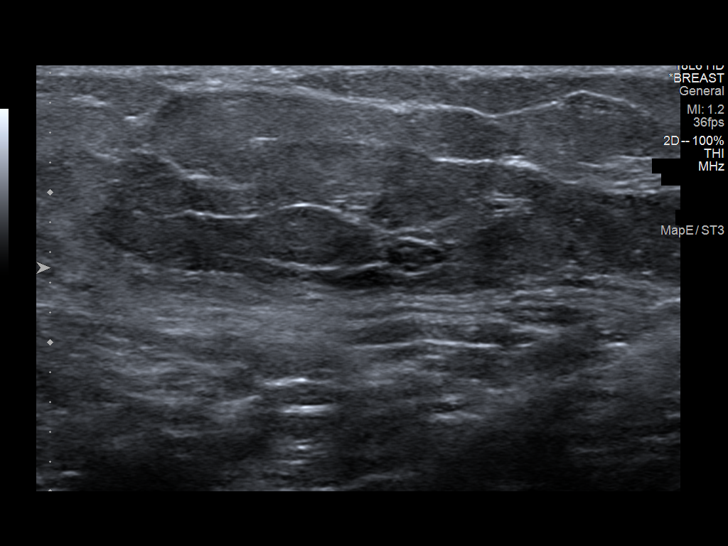
[im 2/10]
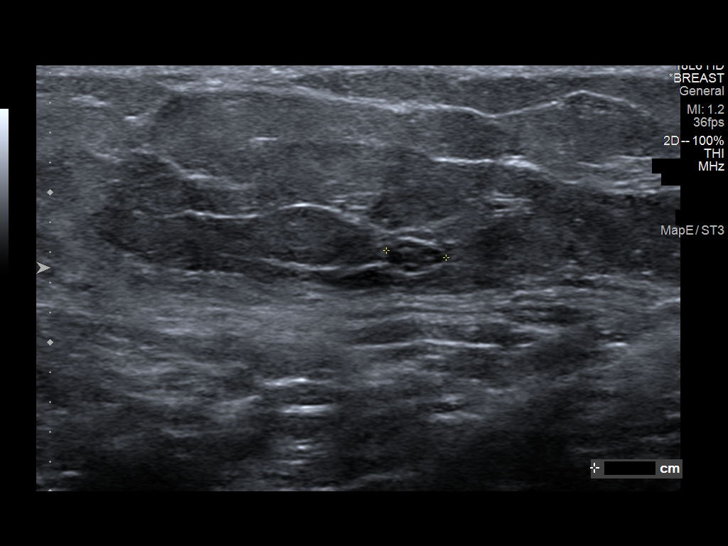
[im 3/10]
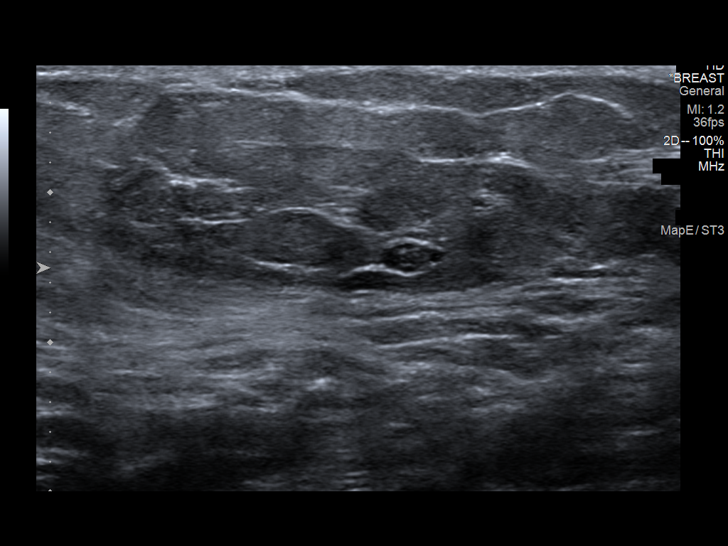
[im 4/10]
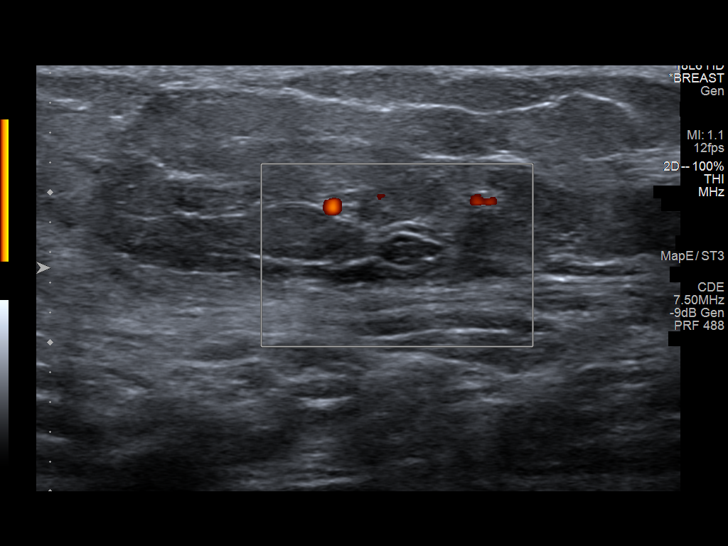
[im 5/10]
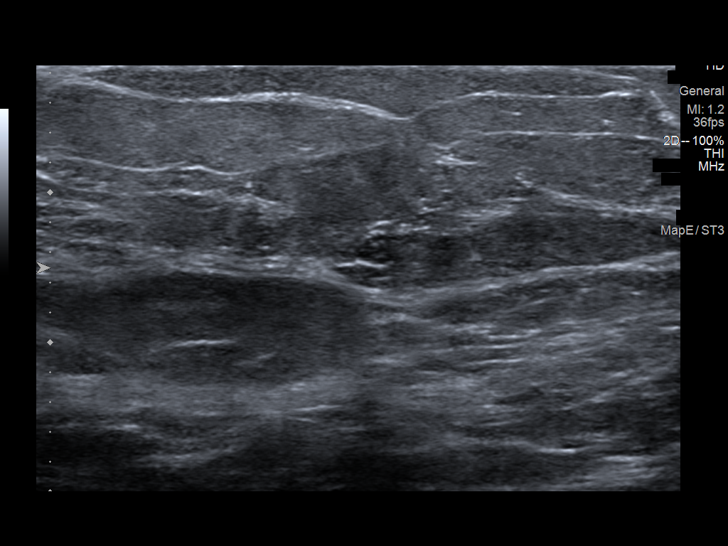
[im 6/10]
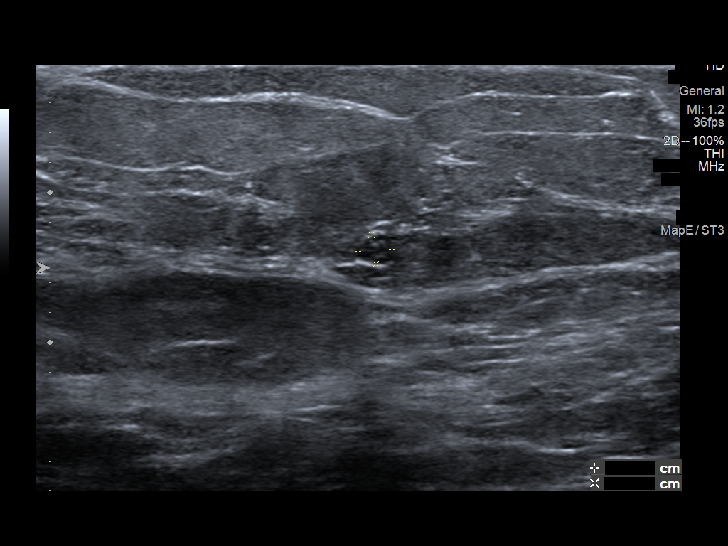
[im 7/10]
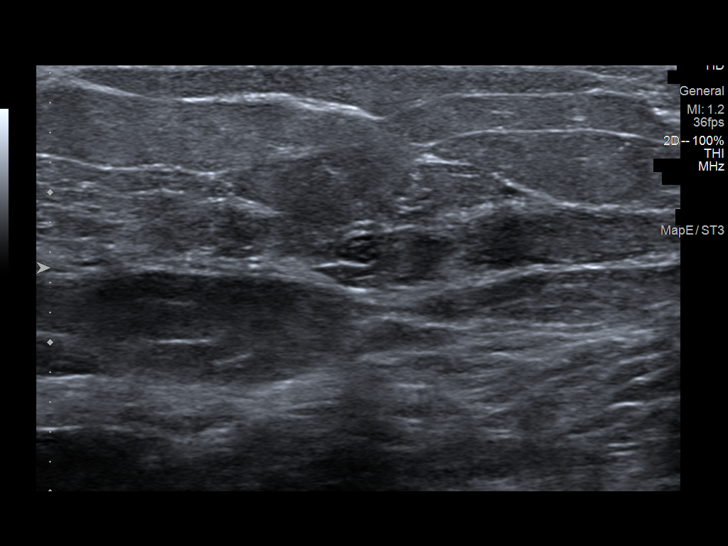
[im 8/10]
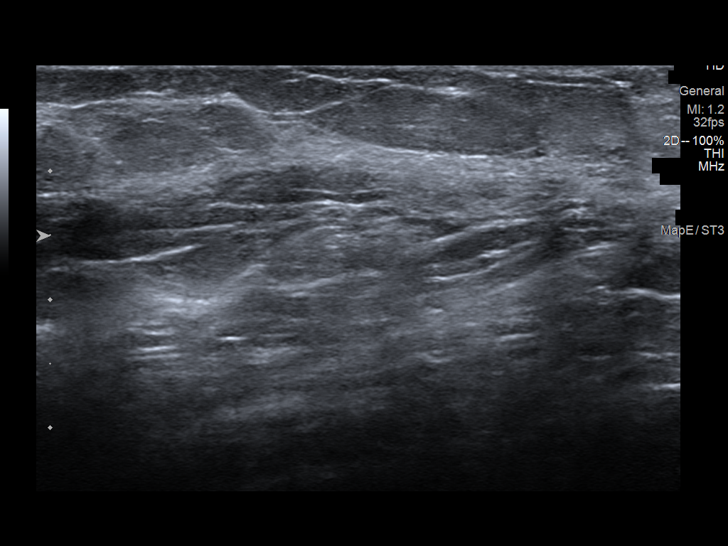
[im 9/10]
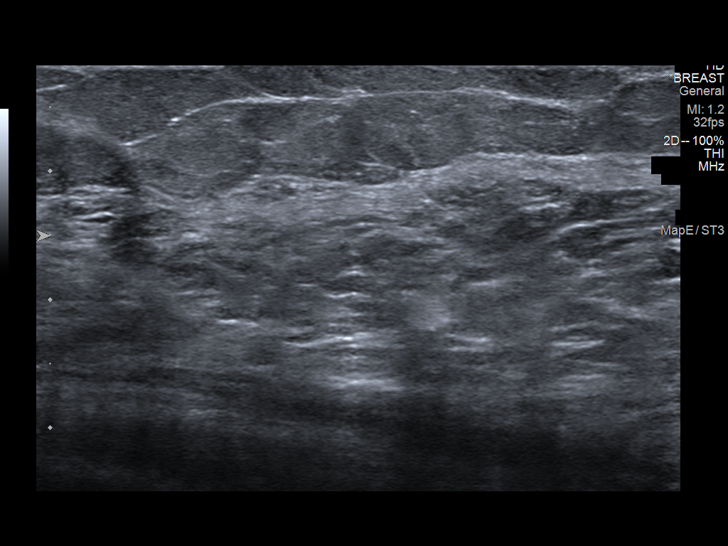
[im 10/10]
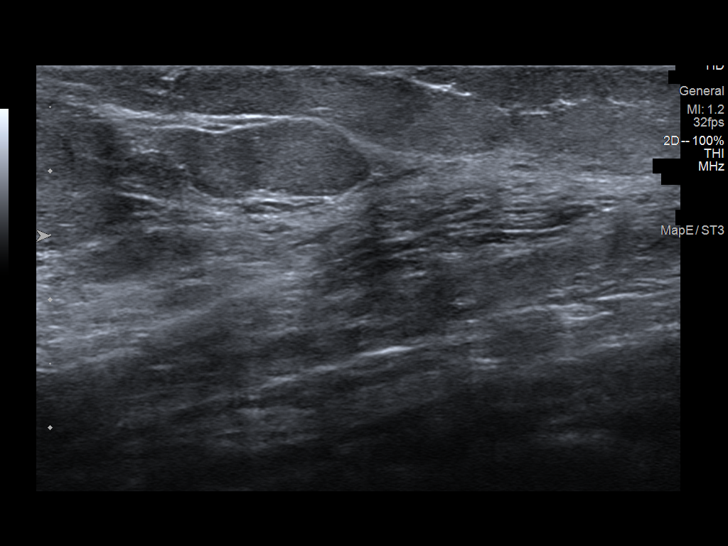

[10 of 10 positions shown; findings below may reference images not displayed]

ACR Breast Density Category b: There are scattered areas of
fibroglandular density.
FINDINGS: No suspicious masses, calcifications, or suspicious findings are
seen in either breast.

On physical exam, no suspicious lumps are identified.

Targeted ultrasound is performed, showing no abnormalities in the
region the patient's pain. A small cluster of cysts is seen at 6
o'clock in the left breast. No other abnormalities.
IMPRESSION: No mammographic or sonographic evidence of malignancy.

RECOMMENDATION:
Treatment of the patient's symptoms should be based on clinical and
physical exam given lack of imaging findings. Recommend annual
screening mammography.

I have discussed the findings and recommendations with the patient.
If applicable, a reminder letter will be sent to the patient
regarding the next appointment.

BI-RADS CATEGORY  2: Benign.

## 2020-09-24 IMAGING — MG DIGITAL DIAGNOSTIC BILAT W/ TOMO W/ CAD
6 of 10 series · 6 of 30 positions shown · non-contrast
Comparison: Previous exam(s).

CLINICAL DATA: The patient presented for left breast pain in [DATE] which had been present since [DATE]. She presents
with continued pain.

EXAM:
DIGITAL DIAGNOSTIC BILATERAL MAMMOGRAM WITH TOMOSYNTHESIS AND CAD;
ULTRASOUND LEFT BREAST LIMITED
TECHNIQUE: Bilateral digital diagnostic mammography and breast tomosynthesis
was performed. The images were evaluated with computer-aided
detection.; Targeted ultrasound examination of the left breast was
performed

[R MLO synth-2D]
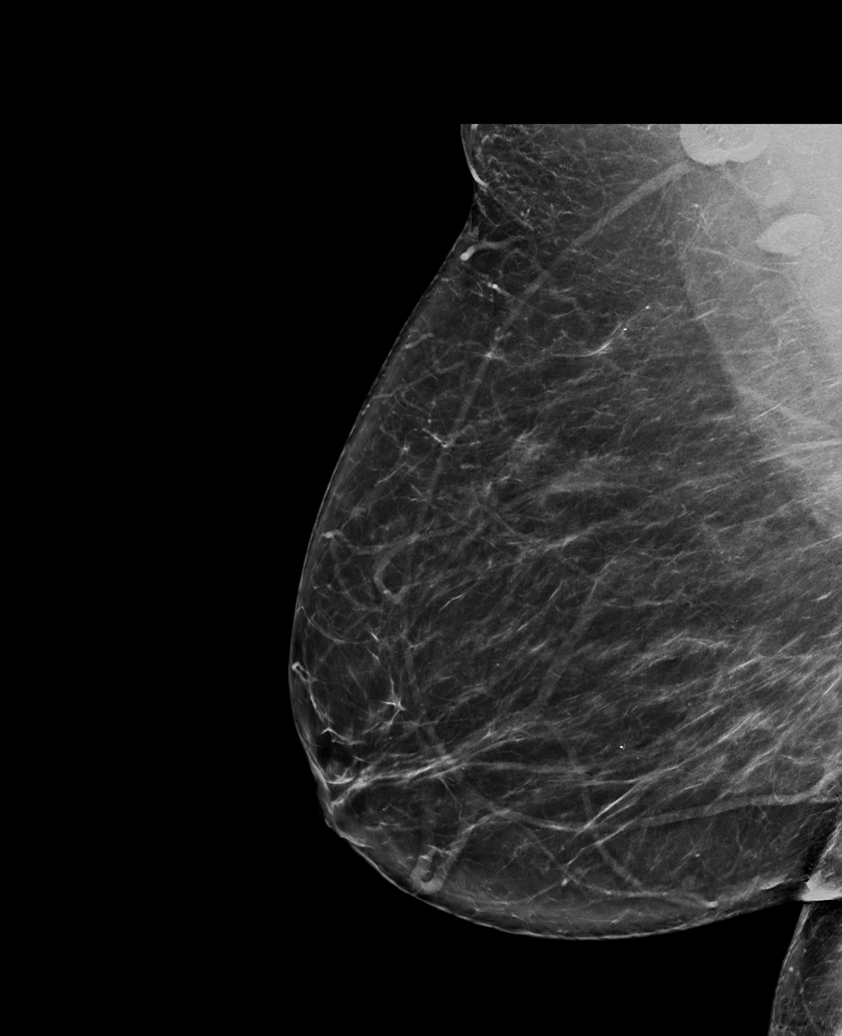

[L MLO synth-2D (1 of 2)]
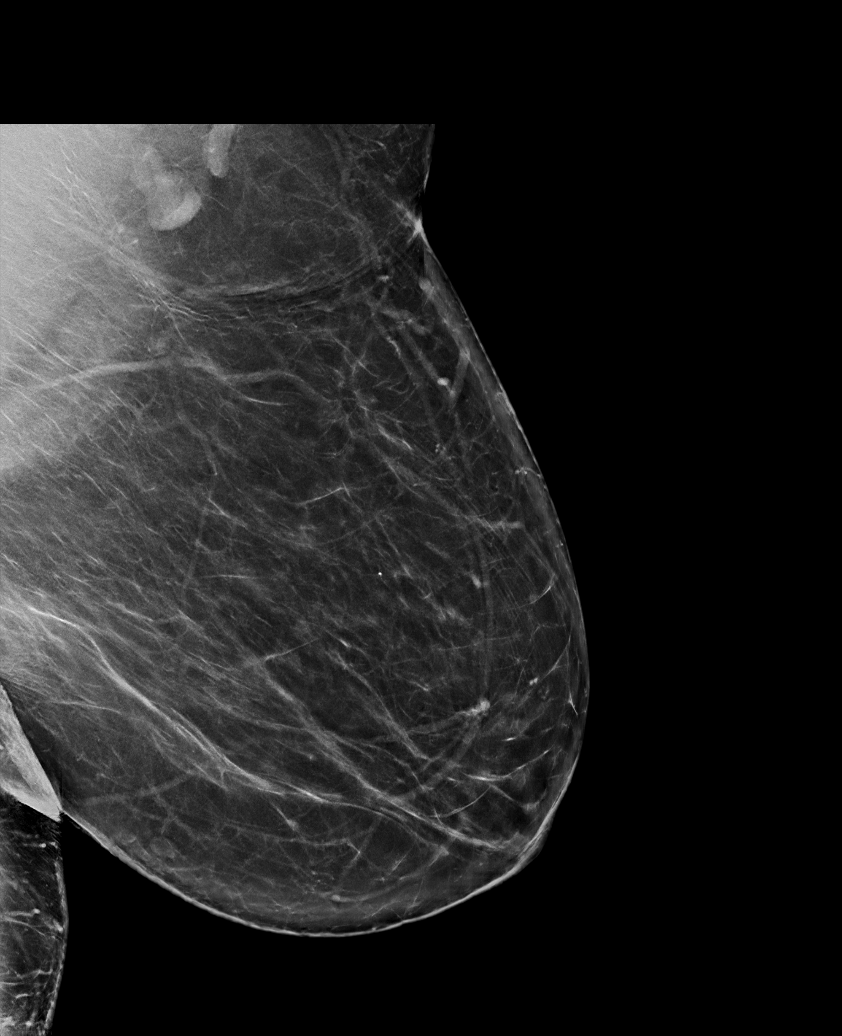

[L CC synth-2D]
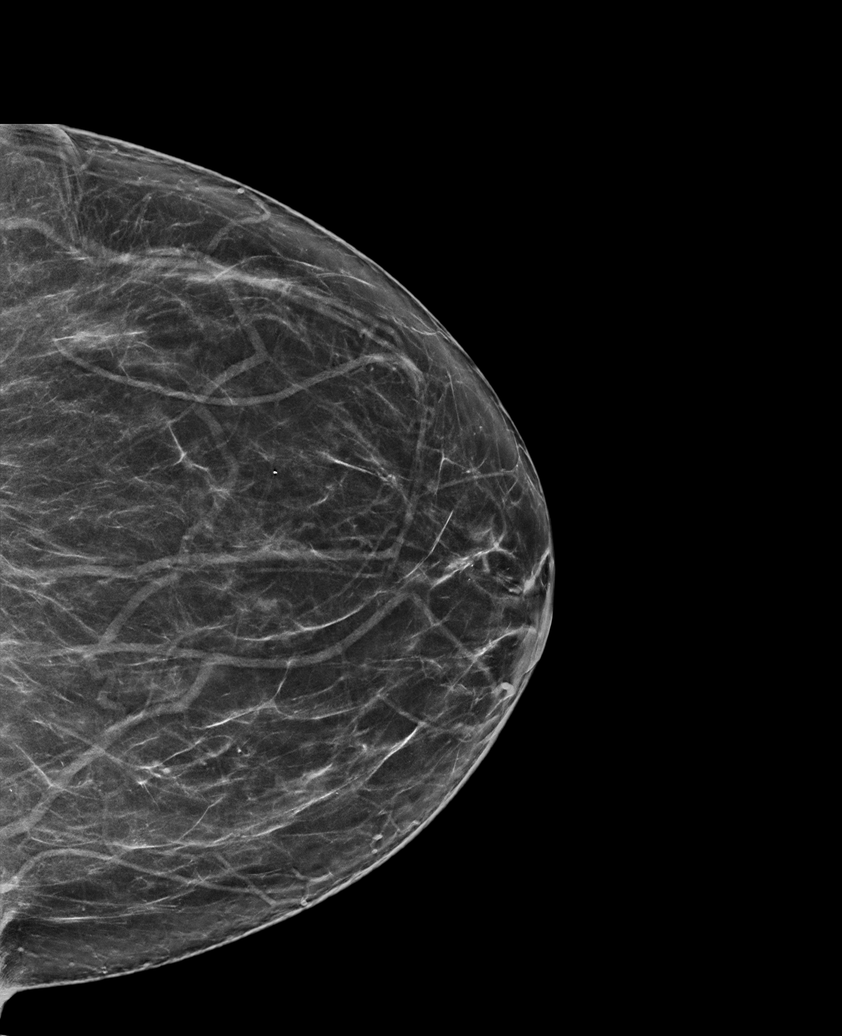

[L MLO synth-2D (2 of 2)]
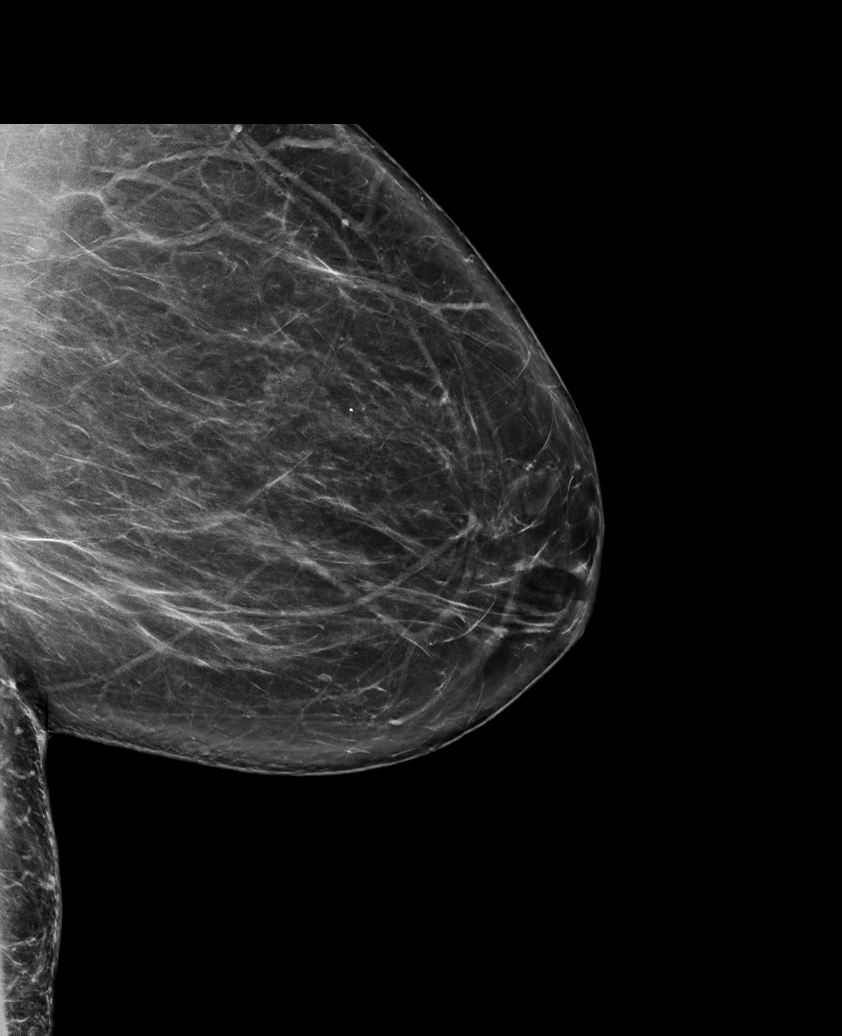

[R CC synth-2D]
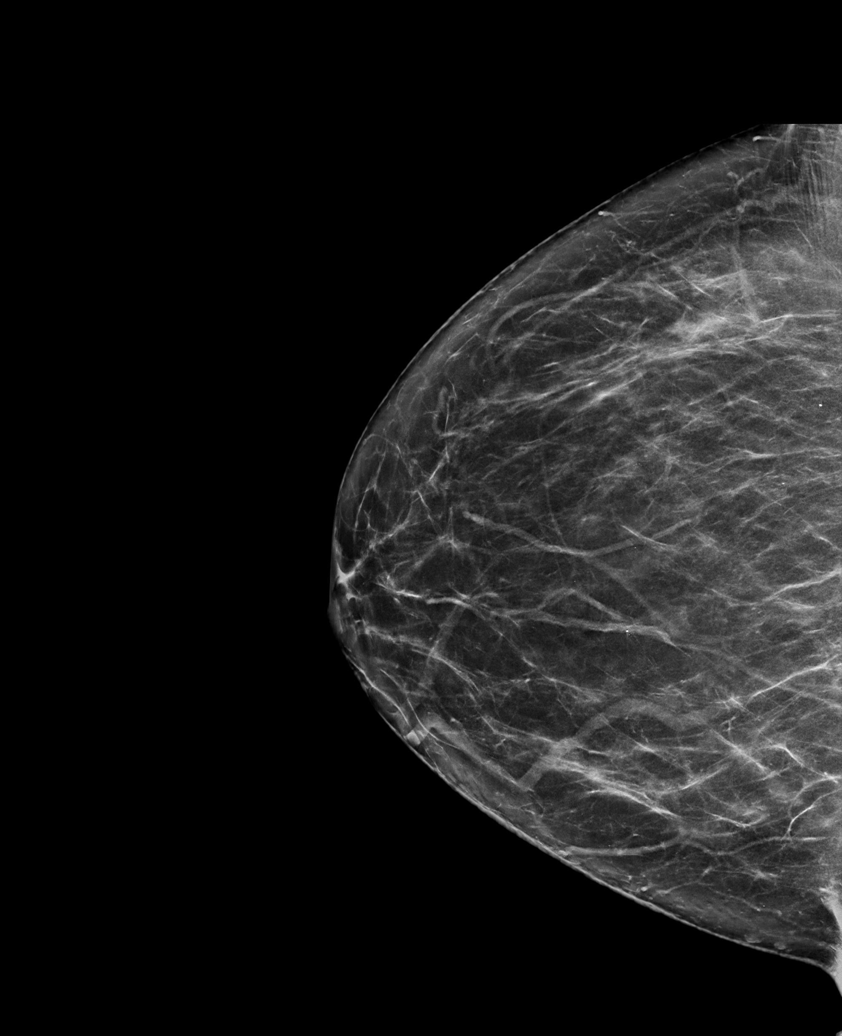

[R MLO tomo · tomo slice 41/82.0]
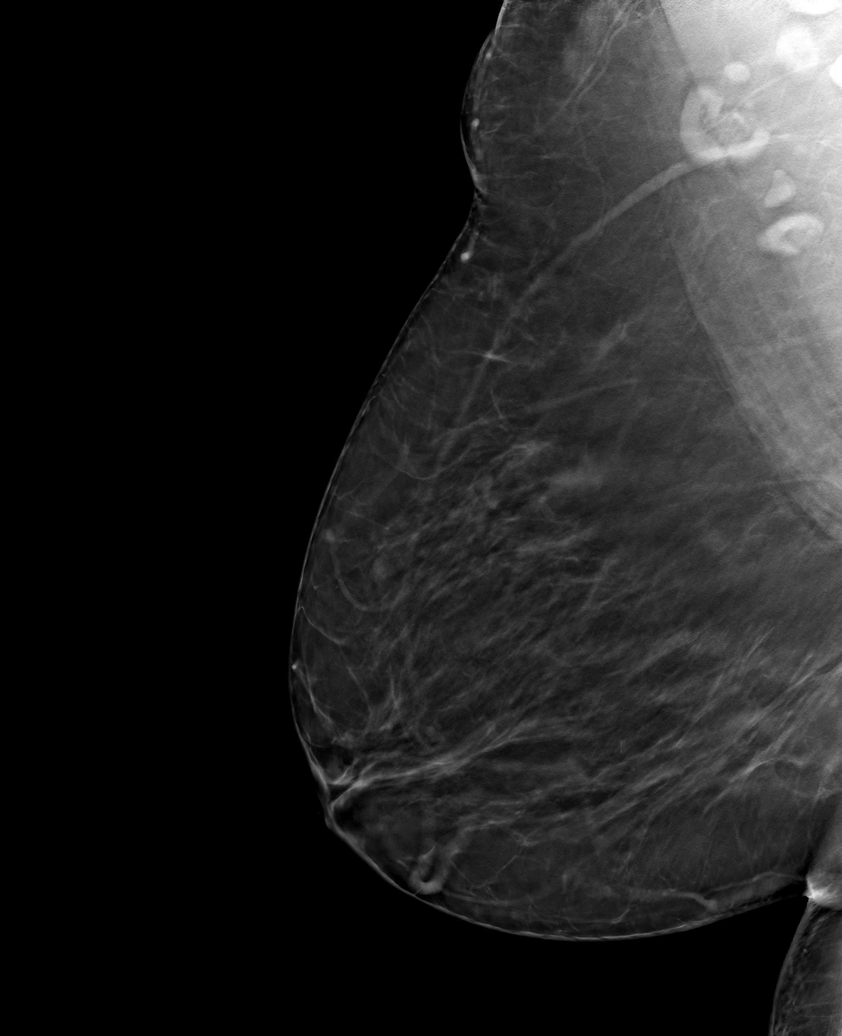

[6 of 30 positions shown; findings below may reference images not displayed]

ACR Breast Density Category b: There are scattered areas of
fibroglandular density.
FINDINGS: No suspicious masses, calcifications, or suspicious findings are
seen in either breast.

On physical exam, no suspicious lumps are identified.

Targeted ultrasound is performed, showing no abnormalities in the
region the patient's pain. A small cluster of cysts is seen at 6
o'clock in the left breast. No other abnormalities.
IMPRESSION: No mammographic or sonographic evidence of malignancy.

RECOMMENDATION:
Treatment of the patient's symptoms should be based on clinical and
physical exam given lack of imaging findings. Recommend annual
screening mammography.

I have discussed the findings and recommendations with the patient.
If applicable, a reminder letter will be sent to the patient
regarding the next appointment.

BI-RADS CATEGORY  2: Benign.

## 2020-10-19 ENCOUNTER — Other Ambulatory Visit: Payer: Self-pay

## 2020-10-19 ENCOUNTER — Emergency Department (HOSPITAL_COMMUNITY)
Admission: EM | Admit: 2020-10-19 | Discharge: 2020-10-20 | Disposition: A | Discharge status: Home / Self Care | Payer: Medicare PPO | Attending: Emergency Medicine | Admitting: Emergency Medicine

## 2020-10-19 ENCOUNTER — Emergency Department (HOSPITAL_COMMUNITY): Payer: Medicare PPO

## 2020-10-19 ENCOUNTER — Encounter (HOSPITAL_COMMUNITY): Payer: Self-pay | Admitting: Emergency Medicine

## 2020-10-19 DIAGNOSIS — R253 Fasciculation: Secondary | ICD-10-CM | POA: Diagnosis not present

## 2020-10-19 DIAGNOSIS — Z7984 Long term (current) use of oral hypoglycemic drugs: Secondary | ICD-10-CM | POA: Diagnosis not present

## 2020-10-19 DIAGNOSIS — R519 Headache, unspecified: Secondary | ICD-10-CM | POA: Diagnosis present

## 2020-10-19 DIAGNOSIS — I10 Essential (primary) hypertension: Secondary | ICD-10-CM | POA: Diagnosis not present

## 2020-10-19 DIAGNOSIS — R7303 Prediabetes: Secondary | ICD-10-CM | POA: Insufficient documentation

## 2020-10-19 DIAGNOSIS — G43809 Other migraine, not intractable, without status migrainosus: Secondary | ICD-10-CM | POA: Diagnosis not present

## 2020-10-19 DIAGNOSIS — J45909 Unspecified asthma, uncomplicated: Secondary | ICD-10-CM | POA: Insufficient documentation

## 2020-10-19 LAB — CBC WITH DIFFERENTIAL/PLATELET
Abs Immature Granulocytes: 0.06 10*3/uL (ref 0.00–0.07)
Basophils Absolute: 0 10*3/uL (ref 0.0–0.1)
Basophils Relative: 0 %
Eosinophils Absolute: 0.2 10*3/uL (ref 0.0–0.5)
Eosinophils Relative: 2 %
HCT: 40.6 % (ref 36.0–46.0)
Hemoglobin: 13.4 g/dL (ref 12.0–15.0)
Immature Granulocytes: 1 %
Lymphocytes Relative: 22 %
Lymphs Abs: 2.8 10*3/uL (ref 0.7–4.0)
MCH: 24.9 pg — ABNORMAL LOW (ref 26.0–34.0)
MCHC: 33 g/dL (ref 30.0–36.0)
MCV: 75.3 fL — ABNORMAL LOW (ref 80.0–100.0)
Monocytes Absolute: 0.9 10*3/uL (ref 0.1–1.0)
Monocytes Relative: 7 %
Neutro Abs: 8.9 10*3/uL — ABNORMAL HIGH (ref 1.7–7.7)
Neutrophils Relative %: 68 %
Platelets: 293 10*3/uL (ref 150–400)
RBC: 5.39 MIL/uL — ABNORMAL HIGH (ref 3.87–5.11)
RDW: 15 % (ref 11.5–15.5)
WBC: 12.9 10*3/uL — ABNORMAL HIGH (ref 4.0–10.5)
nRBC: 0 % (ref 0.0–0.2)

## 2020-10-19 LAB — BASIC METABOLIC PANEL
Anion gap: 5 (ref 5–15)
BUN: 20 mg/dL (ref 8–23)
CO2: 29 mmol/L (ref 22–32)
Calcium: 9.1 mg/dL (ref 8.9–10.3)
Chloride: 104 mmol/L (ref 98–111)
Creatinine, Ser: 0.95 mg/dL (ref 0.44–1.00)
GFR, Estimated: >60 mL/min (ref >60)
Glucose, Bld: 124 mg/dL — ABNORMAL HIGH (ref 70–99)
Potassium: 3.9 mmol/L (ref 3.5–5.1)
Sodium: 138 mmol/L (ref 135–145)

## 2020-10-19 IMAGING — CT CT HEAD W/O CM
4 series · 17 of 47 positions shown, 19 images · non-contrast
Comparison: None.

CLINICAL DATA: Headache

EXAM:
CT HEAD WITHOUT CONTRAST
TECHNIQUE: Contiguous axial images were obtained from the base of the skull
through the vertex without intravenous contrast.

[Series 2: head wo · axial · 0.43mm/px · z∈[-194,-54]mm · 7 of 38 slices shown, 9 images]
[im 5/38  brain]
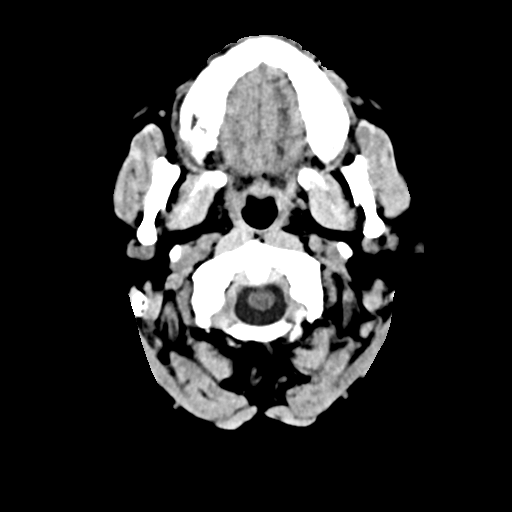
[im 5/38  bone]
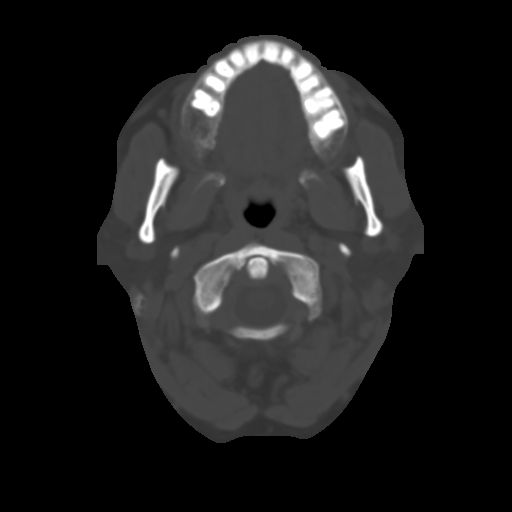
[im 10/38  brain]
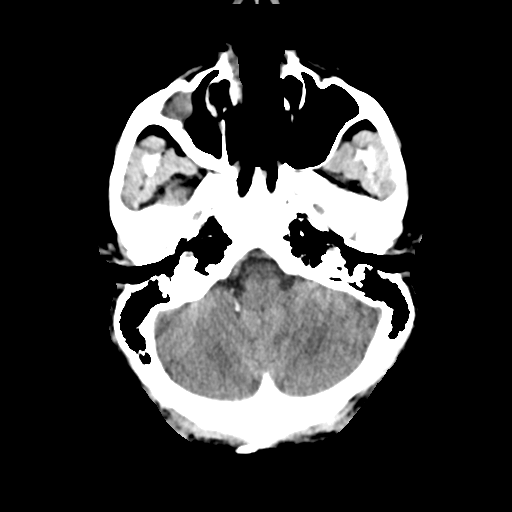
[im 14/38  brain]
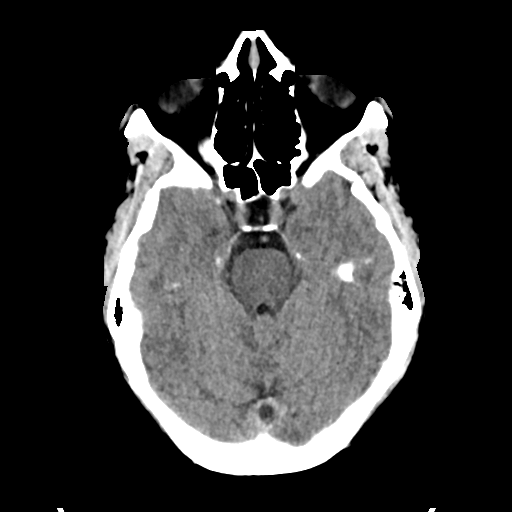
[im 19/38  brain]
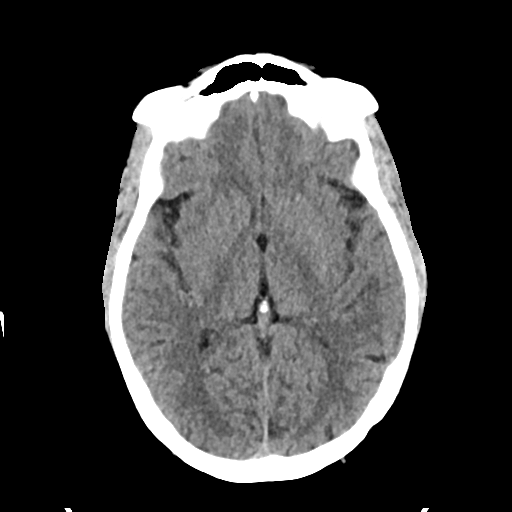
[im 24/38  brain]
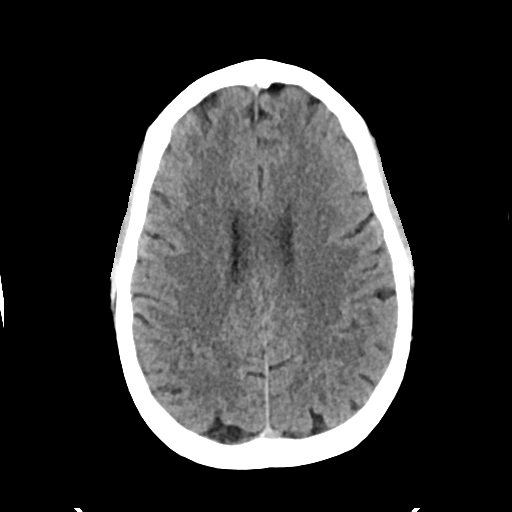
[im 24/38  bone]
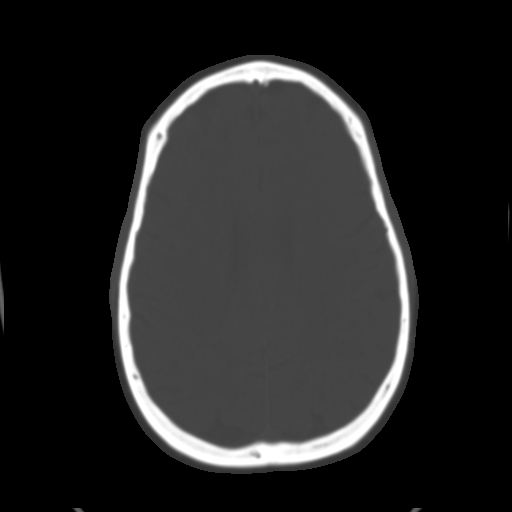
[im 28/38  brain]
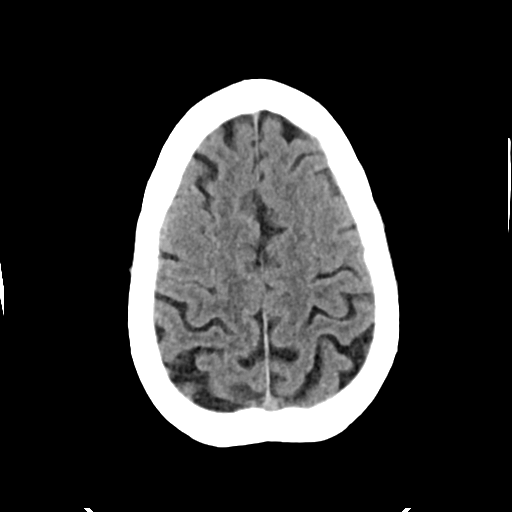
[im 33/38  brain]
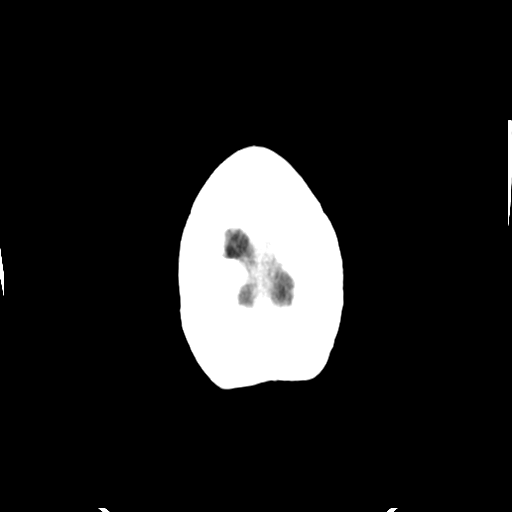

[Series 3: head bone · axial · 0.43mm/px · z∈[-196,-132]mm · 4 of 94 slices shown]
[im 10/94  bone]
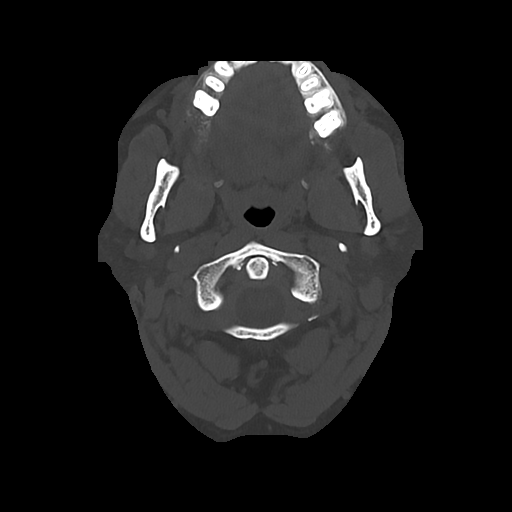
[im 19/94  bone]
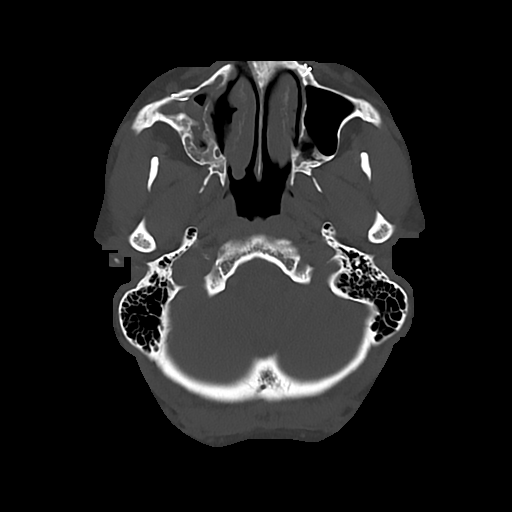
[im 28/94  bone]
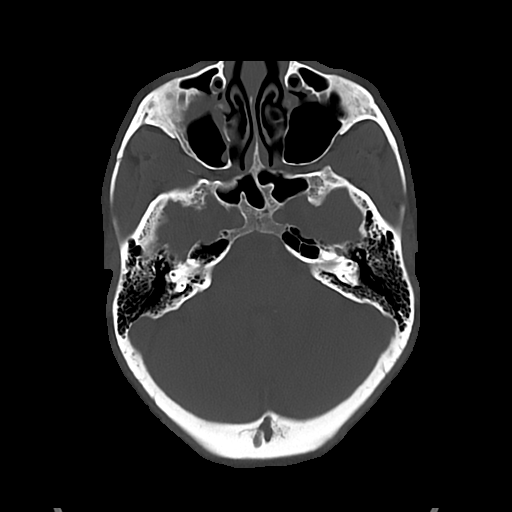
[im 42/94  bone]
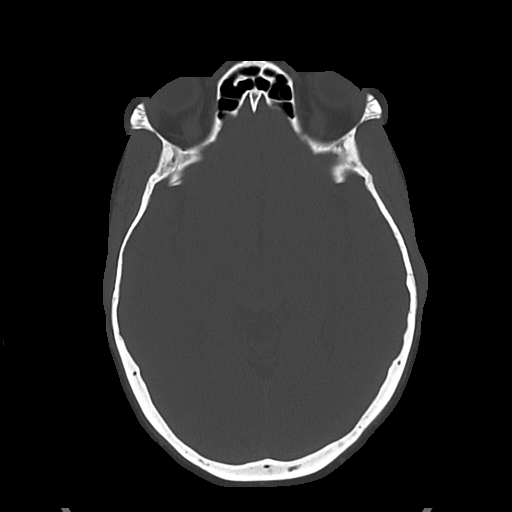

[Series 4: cor soft · coronal · 0.33mm/px · 3 of 71 slices shown]
[im 24/71  brain]
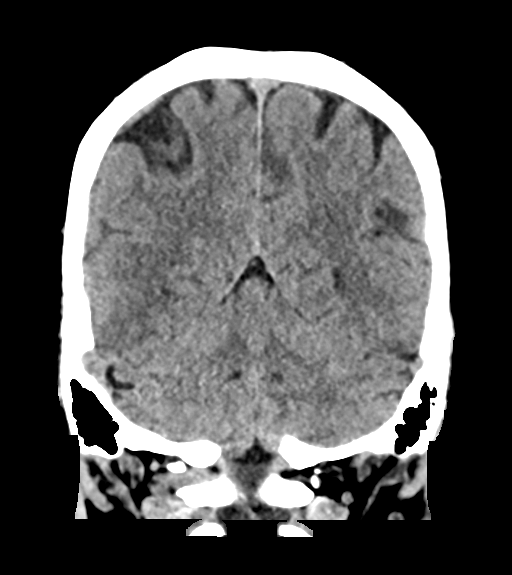
[im 32/71  brain]
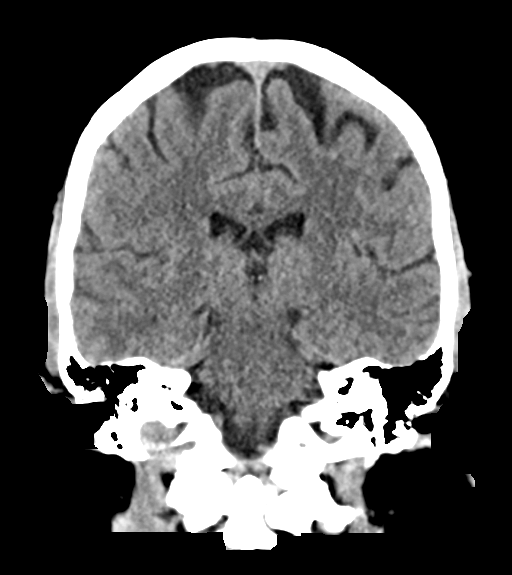
[im 39/71  brain]
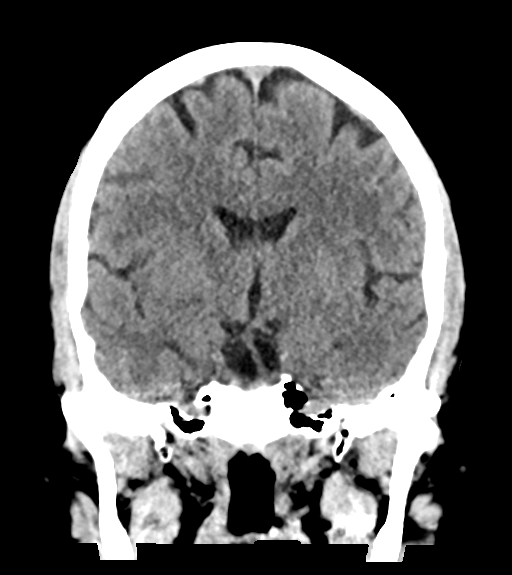

[Series 5: sag soft · sagittal · 0.37mm/px · 3 of 57 slices shown]
[im 19/57  brain]
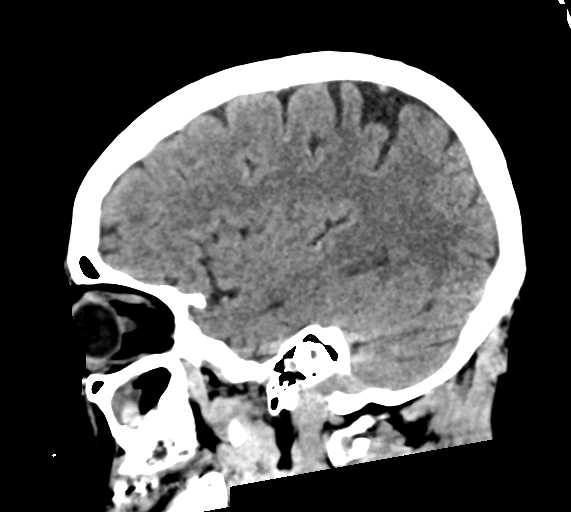
[im 29/57  brain]
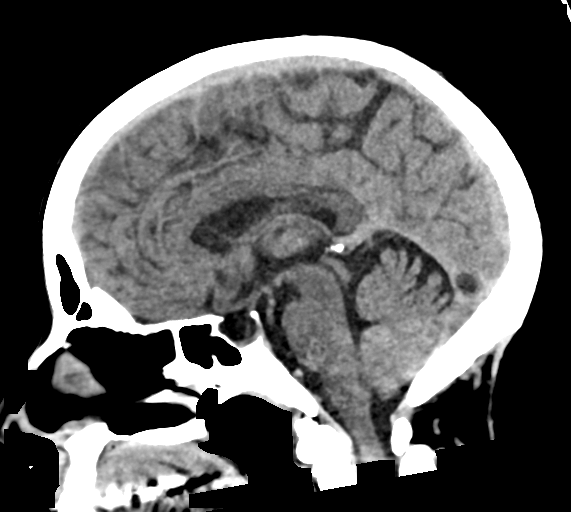
[im 38/57  brain]
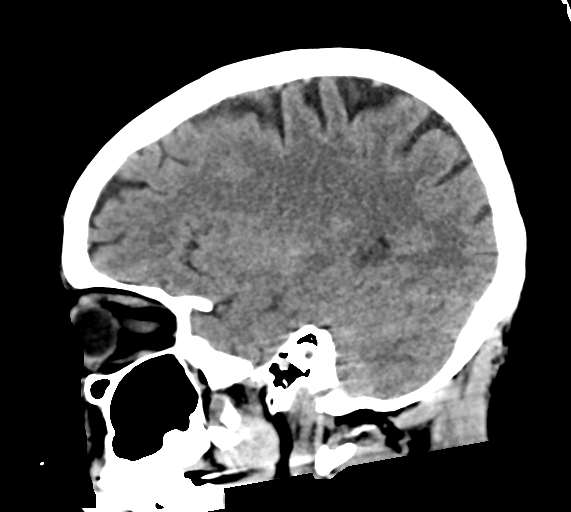

[17 of 47 positions shown; findings below may reference images not displayed]

FINDINGS: Brain: There is no mass, hemorrhage or extra-axial collection. The
size and configuration of the ventricles and extra-axial CSF spaces
are normal. The brain parenchyma is normal, without acute or chronic
infarction.

Vascular: No abnormal hyperdensity of the major intracranial
arteries or dural venous sinuses. No intracranial atherosclerosis.

Skull: The visualized skull base, calvarium and extracranial soft
tissues are normal.

Sinuses/Orbits: No fluid levels or advanced mucosal thickening of
the visualized paranasal sinuses. No mastoid or middle ear effusion.
The orbits are normal.
IMPRESSION: Normal head CT.

## 2020-10-19 NOTE — ED Provider Notes (Signed)
Emergency Medicine Provider Triage Evaluation Note  Bianca Medina , a 70 y.o. female  was evaluated in triage.  Pt complains of intermittent headache x 3 days. Has history of headaches. It has been progressively worsening. Admits to having red eyes and felt like she had racing heart beat, cramps in her left hand and leg, and light headedness. Denies head injury or trauma    Review of Systems  Positive: Headache, Negative: Neck pain or stiffness, fever, nausea, emesis  Physical Exam  BP (!) 147/66 (BP Location: Left Arm)   Pulse 71   Temp 98.8 F (37.1 C) (Oral)   Resp 14   SpO2 97%  Gen:   Awake, no distress   Resp:  Normal effort  MSK:   Moves extremities without difficulty  Other:  Strong and equal grip strength in all extremities. Clear speech, no facial droop  Medical Decision Making  Medically screening exam initiated at 8:42 PM.  Appropriate orders placed.  Bianca Medina was informed that the remainder of the evaluation will be completed by another provider, this initial triage assessment does not replace that evaluation, and the importance of remaining in the ED until their evaluation is complete.  Basic labs for complaints of cramps and head CT ordered.   Portions of this note were generated with [employer]. Dictation errors may occur despite best attempts at proofreading.    Barrie Folk, PA-C 10/19/20 2048    Valarie Merino, MD 10/20/20 1944

## 2020-10-19 NOTE — ED Triage Notes (Addendum)
Pt describes "surges in head" that comes and goes w/ increased episodes since Thursday.  Pt states she gets headaches, red eyes, twitches w/ such.  No neuro deficits in triage

## 2020-10-20 MED ORDER — IBUPROFEN 600 MG PO TABS: 600.0000 mg | ORAL_TABLET | Freq: Four times a day (QID) | ORAL | 0 refills | Status: AC | PRN

## 2020-10-20 MED ORDER — DIPHENHYDRAMINE HCL 25 MG PO TABS
25.0000 mg | ORAL_TABLET | Freq: Four times a day (QID) | ORAL | 0 refills | Status: DC | PRN
Start: 2020-10-20 — End: 2020-11-27

## 2020-10-20 MED ORDER — METOCLOPRAMIDE HCL 10 MG PO TABS: 10.0000 mg | ORAL_TABLET | Freq: Two times a day (BID) | ORAL | 0 refills | Status: DC | PRN

## 2020-10-20 NOTE — ED Notes (Signed)
Pt  Covered from head to toe with blankets sound asleep  She did not open her eyes when   I called her name

## 2020-10-20 NOTE — ED Notes (Signed)
She had head covered by the blankets

## 2020-10-20 NOTE — ED Provider Notes (Signed)
[facility] EMERGENCY DEPARTMENT Provider Note   CSN: [id number] Arrival date & time: 10/19/20  1851     History Chief Complaint  Patient presents with  . Headache    Bianca Medina is a 70 y.o. female w/ hx of complex migraines presenting to ED with head pain and muscle twitches.  Reports this has gone on for a long time, but worsened 3 days ago.  Twitching in forehead, constant, nothing makes it better or worse, mild headache, feels like a "surge."  Also having arm twitches.  She saw Dr Redmond Pulling from neurology in 2021 for same presentation, was felt to have complex migraines, had a normal MRI brain in march 2021 per record review.  HPI     Past Medical History:  Diagnosis Date  . Anemia 2003   IRON DEFICIENCY   . Asthma   . Common migraine 06/23/2019  . Diabetes mellitus without complication ([place])   . GERD (gastroesophageal reflux disease)   . Hyperlipidemia, mild   . Hypertension   . NSVD (normal spontaneous vaginal delivery)    X3  . Obesity   . PMDD (premenstrual dysphoric disorder)   . Sleep disorder     Patient Active Problem List   Diagnosis Date Noted  . Ear itching 06/27/2019  . Common migraine 06/23/2019  . History of anemia 03/31/2019  . Pre-diabetes 03/31/2019  . Viral syndrome 03/31/2019  . Sensorineural hearing loss (SNHL), bilateral 08/12/2017  . Tinnitus of both ears 08/12/2017  . Morbid obesity ([place]) 12/28/2016  . Left breast mass 07/19/2014  . Breast tenderness in female 07/19/2014  . Menopause 01/09/2014  . Vaginal atrophy 01/09/2014  . Skin lesion 12/23/2012  . Essential hypertension 10/31/2012  . Heartburn 10/31/2012    Past Surgical History:  Procedure Laterality Date  . ABDOMINAL HYSTERECTOMY    . GUM SURGERY  1980's  . TUBAL LIGATION       OB History    Gravida  3   Para  3   Term      Preterm      AB      Living  3     SAB      IAB      Ectopic      Multiple      Live Births               Family History  Problem Relation Age of Onset  . Cancer Mother        LUNG- SMOKER  . Diabetes Father   . Hypertension Father   . Heart disease Father   . Stroke Father   . Cancer Paternal Aunt        COLON  . Cancer Paternal Uncle        COLON  . Breast cancer Sister     Social History   Tobacco Use  . Smoking status: Never Smoker  . Smokeless tobacco: Never Used  Vaping Use  . Vaping Use: Never used  Substance Use Topics  . Alcohol use: No  . Drug use: No    Home Medications Prior to Admission medications   Medication Sig Start Date End Date Taking? Authorizing Provider  diphenhydrAMINE (BENADRYL) 25 MG tablet Take 1 tablet (25 mg total) by mouth every 6 (six) hours as needed for up to 30 doses (Migraine - take with Reglan). Migraine - take with reglan 10/20/20  Yes Jenniger Figiel, Carola Rhine, MD  ibuprofen (ADVIL) 600 MG tablet Take 1 tablet (  600 mg total) by mouth every 6 (six) hours as needed for headache. 10/20/20 11/19/20 Yes Keishawn Rajewski, Carola Rhine, MD  metoCLOPramide (REGLAN) 10 MG tablet Take 1 tablet (10 mg total) by mouth every 12 (twelve) hours as needed for up to 10 doses for nausea. 10/20/20  Yes Wyvonnia Dusky, MD  azelastine (ASTELIN) 0.1 % nasal spray Place 1 spray into both nostrils daily.  08/06/17   [provider]  Bioflavonoid Products (BIOFLEX PO) Take 1 tablet by mouth daily.    [provider]  Biotin 5000 MCG CAPS Take 5,000 mcg by mouth daily.     [provider]  chlorthalidone (HYGROTON) 25 MG tablet Take 25 mg by mouth daily.  04/08/16   [provider]  esomeprazole (NEXIUM) 40 MG capsule Take 40 mg by mouth daily.     [provider]  flurbiprofen (ANSAID) 100 MG tablet Take 100 mg by mouth 2 (two) times daily. 12/13/19   [provider]  lidocaine (XYLOCAINE) 5 % ointment  06/01/19   [provider]  linaclotide (LINZESS) 145 MCG CAPS capsule Take 145 mcg by mouth daily before breakfast.     [provider]  losartan (COZAAR) 50 MG tablet Take 50 mg by mouth every morning.  10/13/17   [provider]  metFORMIN (GLUCOPHAGE) 500 MG tablet Take 500 mg by mouth daily.     [provider]  Mineral Oil-Hydrophil Petrolat OINT  06/01/19   [provider]  Multiple Vitamin (MULTI-VITAMIN) tablet Take by mouth.    [provider]  potassium chloride (KLOR-CON) 10 MEQ tablet Take 10 mEq by mouth 2 (two) times daily.    [provider]  PROAIR HFA 108 [phone] Base) MCG/ACT inhaler Inhale 1-2 puffs into the lungs every 4 (four) hours as needed for wheezing or shortness of breath.  03/16/18   [provider]  rosuvastatin (CRESTOR) 20 MG tablet Take 20 mg by mouth daily.    [provider]  vitamin E 1000 UNIT capsule Take 1,000 Units by mouth daily.    [provider]    Allergies    Codeine  Review of Systems   Review of Systems  Constitutional: Negative for chills and fever.  Eyes: Negative for pain and visual disturbance.  Respiratory: Negative for cough and shortness of breath.   Cardiovascular: Negative for chest pain and palpitations.  Gastrointestinal: Negative for abdominal pain and vomiting.  Musculoskeletal: Negative for arthralgias and back pain.  Skin: Negative for color change and rash.  Neurological: Positive for headaches. Negative for weakness and numbness.  All other systems reviewed and are negative.   Physical Exam Updated Vital Signs BP (!) 158/84   Pulse (!) 58   Temp 98.1 F (36.7 C)   Resp 18   SpO2 98%   Physical Exam Constitutional:      General: She is not in acute distress. HENT:     Head: Normocephalic and atraumatic.  Eyes:     Conjunctiva/sclera: Conjunctivae normal.     Pupils: Pupils are equal, round, and reactive to light.  Cardiovascular:     Rate and Rhythm: Normal rate and regular rhythm.  Pulmonary:     Effort: Pulmonary effort is normal. No respiratory  distress.  Skin:    General: Skin is warm and dry.  Neurological:     General: No focal deficit present.     Mental Status: She is alert. Mental status is at baseline.     GCS:  GCS eye subscore is 4. GCS verbal subscore is 5. GCS motor subscore is 6.     Cranial Nerves: No cranial nerve deficit.     Sensory: No sensory deficit.     Motor: No weakness.  Psychiatric:        Mood and Affect: Mood normal.        Behavior: Behavior normal.     ED Results / Procedures / Treatments   Labs (all labs ordered are listed, but only abnormal results are displayed) Labs Reviewed  BASIC METABOLIC PANEL - Abnormal; Notable for the following components:      Result Value   Glucose, Bld 124 (*)    All other components within normal limits  CBC WITH DIFFERENTIAL/PLATELET - Abnormal; Notable for the following components:   WBC 12.9 (*)    RBC 5.39 (*)    MCV 75.3 (*)    MCH 24.9 (*)    Neutro Abs 8.9 (*)    All other components within normal limits    EKG None  Radiology CT Head Wo Contrast  Result Date: 10/19/2020 CLINICAL DATA:  Headache EXAM: CT HEAD WITHOUT CONTRAST TECHNIQUE: Contiguous axial images were obtained from the base of the skull through the vertex without intravenous contrast. COMPARISON:  None. FINDINGS: Brain: There is no mass, hemorrhage or extra-axial collection. The size and configuration of the ventricles and extra-axial CSF spaces are normal. The brain parenchyma is normal, without acute or chronic infarction. Vascular: No abnormal hyperdensity of the major intracranial arteries or dural venous sinuses. No intracranial atherosclerosis. Skull: The visualized skull base, calvarium and extracranial soft tissues are normal. Sinuses/Orbits: No fluid levels or advanced mucosal thickening of the visualized paranasal sinuses. No mastoid or middle ear effusion. The orbits are normal. IMPRESSION: Normal head CT. Electronically Signed   By: Ulyses Jarred M.D.   On: 10/19/2020 21:50     Procedures Procedures   Medications Ordered in ED Medications - No data to display  ED Course  I have reviewed the triage vital signs and the nursing notes.  Pertinent labs & imaging results that were available during my care of the patient were reviewed by me and considered in my medical decision making (see chart for details).  Ddx includes complex migraine vs electrolyte deficiency vs other  Benign neuro exam today CTH negative - doubt SAH, meningitis, or ICH with this clinical presentation Doubt MS - normal MRI 1 year ago with same symptoms  Labs reviewed - BMP normal  CBC unremarkable, with chronic leukocytosis (mild)  I offered IV medications for migraine here but she would prefer to go home and take her medications at home.  I think this is reasonable.  Okay for discharge.  Clinical Course as of 10/20/20 1321  Sun Oct 20, 2020  0347 Wbc chronically elevated in this range. [MT]    Clinical Course User Index [MT] Langston Masker Carola Rhine, MD    Final Clinical Impression(s) / ED Diagnoses Final diagnoses:  Other migraine without status migrainosus, not intractable    Rx / DC Orders ED Discharge Orders         Ordered    metoCLOPramide (REGLAN) 10 MG tablet  Every 12 hours PRN        10/20/20 0450    diphenhydrAMINE (BENADRYL) 25 MG tablet  Every 6 hours PRN        10/20/20 0450    ibuprofen (ADVIL) 600 MG tablet  Every 6 hours PRN  10/20/20 4360           Wyvonnia Dusky, MD 10/20/20 1321

## 2020-11-08 ENCOUNTER — Encounter (HOSPITAL_COMMUNITY): Payer: Self-pay | Admitting: Emergency Medicine

## 2020-11-08 ENCOUNTER — Ambulatory Visit (HOSPITAL_COMMUNITY)
Admission: EM | Admit: 2020-11-08 | Discharge: 2020-11-08 | Disposition: A | Discharge status: Home / Self Care | Payer: Medicare PPO | Attending: Medical Oncology | Admitting: Medical Oncology

## 2020-11-08 DIAGNOSIS — I10 Essential (primary) hypertension: Secondary | ICD-10-CM

## 2020-11-08 DIAGNOSIS — E1165 Type 2 diabetes mellitus with hyperglycemia: Secondary | ICD-10-CM

## 2020-11-08 DIAGNOSIS — E118 Type 2 diabetes mellitus with unspecified complications: Secondary | ICD-10-CM | POA: Diagnosis not present

## 2020-11-08 DIAGNOSIS — E119 Type 2 diabetes mellitus without complications: Secondary | ICD-10-CM

## 2020-11-08 LAB — CBG MONITORING, ED: Glucose-Capillary: 111 mg/dL — ABNORMAL HIGH (ref 70–99)

## 2020-11-08 MED ORDER — LOSARTAN POTASSIUM 25 MG PO TABS
25.0000 mg | ORAL_TABLET | Freq: Every day | ORAL | 0 refills | Status: DC
Start: 2020-11-08 — End: 2020-11-27

## 2020-11-08 NOTE — ED Triage Notes (Addendum)
Pt presents with headache and dizziness xs 3-4 days. Is requesting exam with BP check and A1C. Pt states has not seen PCP at this time.

## 2020-11-08 NOTE — ED Provider Notes (Signed)
[facility]    CSN: [id number] Arrival date & time: 11/08/20  0841      History   Chief Complaint Chief Complaint  Patient presents with   Headache   Dizziness    HPI Bianca Medina is a 70 y.o. female.   HPI  Headache: Pt states that for the past 3-4 days she has had mild headache and scant dizziness. She questions if this is related to her BP or DM2 as she has not been monitoring symptoms and she reports that she is off of her metformin and losartan. She reports that she has been off of these medications for over 1 year and is unsure why she was taken off of this medication. She requests BP check and glucose monitoring today. She denies head trauma, fevers, neck stiffness, visual changes.   Past Medical History:  Diagnosis Date   Anemia 2003   IRON DEFICIENCY    Asthma    Common migraine 06/23/2019   Diabetes mellitus without complication (H[facility])    GERD (gastroesophageal reflux disease)    Hyperlipidemia, mild    Hypertension    NSVD (normal spontaneous vaginal delivery)    X3   Obesity    PMDD (premenstrual dysphoric disorder)    Sleep disorder     Patient Active Problem List   Diagnosis Date Noted   Ear itching 06/27/2019   Common migraine 06/23/2019   History of anemia 03/31/2019   Pre-diabetes 03/31/2019   Viral syndrome 03/31/2019   Sensorineural hearing loss (SNHL), bilateral 08/12/2017   Tinnitus of both ears 08/12/2017   Morbid obesity ([place]) 12/28/2016   Left breast mass 07/19/2014   Breast tenderness in female 07/19/2014   Menopause 01/09/2014   Vaginal atrophy 01/09/2014   Skin lesion 12/23/2012   Essential hypertension 10/31/2012   Heartburn 10/31/2012    Past Surgical History:  Procedure Laterality Date   ABDOMINAL HYSTERECTOMY     GUM SURGERY  1980's   TUBAL LIGATION      OB History     Gravida  3   Para  3   Term      Preterm      AB      Living  3      SAB      IAB      Ectopic      Multiple      Live  Births               Home Medications    Prior to Admission medications   Medication Sig Start Date End Date Taking? Authorizing Provider  azelastine (ASTELIN) 0.1 % nasal spray Place 1 spray into both nostrils daily.  08/06/17   [provider]  Bioflavonoid Products (BIOFLEX PO) Take 1 tablet by mouth daily.    [provider]  Biotin 5000 MCG CAPS Take 5,000 mcg by mouth daily.     [provider]  chlorthalidone (HYGROTON) 25 MG tablet Take 25 mg by mouth daily.  04/08/16   [provider]  diphenhydrAMINE (BENADRYL) 25 MG tablet Take 1 tablet (25 mg total) by mouth every 6 (six) hours as needed for up to 30 doses (Migraine - take with Reglan). Migraine - take with reglan 10/20/20   Wyvonnia Dusky, MD  esomeprazole (NEXIUM) 40 MG capsule Take 40 mg by mouth daily.     [provider]  flurbiprofen (ANSAID) 100 MG tablet Take 100 mg by mouth 2 (two) times daily. 12/13/19  [provider]  ibuprofen (ADVIL) 600 MG tablet Take 1 tablet (600 mg total) by mouth every 6 (six) hours as needed for headache. 10/20/20 11/19/20  Wyvonnia Dusky, MD  lidocaine (XYLOCAINE) 5 % ointment  06/01/19   [provider]  linaclotide (LINZESS) 145 MCG CAPS capsule Take 145 mcg by mouth daily before breakfast.    [provider]  losartan (COZAAR) 50 MG tablet Take 50 mg by mouth every morning.  10/13/17   [provider]  metFORMIN (GLUCOPHAGE) 500 MG tablet Take 500 mg by mouth daily.     [provider]  metoCLOPramide (REGLAN) 10 MG tablet Take 1 tablet (10 mg total) by mouth every 12 (twelve) hours as needed for up to 10 doses for nausea. 10/20/20   Wyvonnia Dusky, MD  Mineral Oil-Hydrophil Petrolat OINT  06/01/19   [provider]  Multiple Vitamin (MULTI-VITAMIN) tablet Take by mouth.    [provider]  potassium chloride (KLOR-CON) 10 MEQ tablet Take 10 mEq by mouth 2 (two) times daily.     [provider]  PROAIR HFA 108 [phone] Base) MCG/ACT inhaler Inhale 1-2 puffs into the lungs every 4 (four) hours as needed for wheezing or shortness of breath.  03/16/18   [provider]  rosuvastatin (CRESTOR) 20 MG tablet Take 20 mg by mouth daily.    [provider]  vitamin E 1000 UNIT capsule Take 1,000 Units by mouth daily.    [provider]    Family History Family History  Problem Relation Age of Onset   Cancer Mother        LUNG- SMOKER   Diabetes Father    Hypertension Father    Heart disease Father    Stroke Father    Cancer Paternal Aunt        COLON   Cancer Paternal Uncle        COLON   Breast cancer Sister     Social History Social History   Tobacco Use   Smoking status: Never   Smokeless tobacco: Never  Vaping Use   Vaping Use: Never used  Substance Use Topics   Alcohol use: No   Drug use: No     Allergies   Codeine   Review of Systems Review of Systems  As stated above in HPI Physical Exam Triage Vital Signs ED Triage Vitals  Enc Vitals Group     BP 11/08/20 0853 (!) 155/84     Pulse Rate 11/08/20 0853 60     Resp 11/08/20 0853 18     Temp 11/08/20 0853 98.5 F (36.9 C)     Temp Source 11/08/20 0853 Oral     SpO2 11/08/20 0853 95 %     Weight --      Height --      Head Circumference --      Peak Flow --      Pain Score 11/08/20 0855 0     Pain Loc --      Pain Edu? --      Excl. in [place]? --    No data found.  Updated Vital Signs BP (!) 155/84 (BP Location: Right Arm)   Pulse 60   Temp 98.5 F (36.9 C) (Oral)   Resp 18   SpO2 95%   Physical Exam Vitals and nursing note reviewed.  Constitutional:      General: She is not in acute distress.    Appearance: She is well-developed. She  is not ill-appearing, toxic-appearing or diaphoretic.  HENT:     Head: Normocephalic and atraumatic.     Mouth/Throat:     Mouth: Mucous membranes are moist.     Pharynx: Oropharynx is clear.  Eyes:      General: No visual field deficit.    Extraocular Movements: Extraocular movements intact.     Right eye: Normal extraocular motion and no nystagmus.     Left eye: Normal extraocular motion and no nystagmus.     Pupils: Pupils are equal, round, and reactive to light.     Right eye: Pupil is round and reactive.     Left eye: Pupil is round and reactive.  Cardiovascular:     Rate and Rhythm: Normal rate and regular rhythm.     Heart sounds: Normal heart sounds.  Pulmonary:     Effort: Pulmonary effort is normal.     Breath sounds: Normal breath sounds.  Musculoskeletal:     Cervical back: Normal range of motion and neck supple. No rigidity.  Skin:    General: Skin is warm.     Coloration: Skin is not cyanotic or pale.  Neurological:     Mental Status: She is alert and oriented to person, place, and time.     Cranial Nerves: No cranial nerve deficit, dysarthria or facial asymmetry.     Sensory: No sensory deficit.     Motor: No weakness.     Coordination: Coordination normal.     Gait: Gait normal.     Deep Tendon Reflexes: Reflexes normal.  Psychiatric:        Mood and Affect: Mood normal.        Speech: Speech normal.        Behavior: Behavior normal.     UC Treatments / Results  Labs (all labs ordered are listed, but only abnormal results are displayed) Labs Reviewed - No data to display  EKG   Radiology No results found.  Procedures Procedures (including critical care time)  Medications Ordered in UC Medications - No data to display  Initial Impression / Assessment and Plan / UC Course  I have reviewed the triage vital signs and the nursing notes.  Pertinent labs & imaging results that were available during my care of the patient were reviewed by me and considered in my medical decision making (see chart for details).     New.  Her BP is slightly increased which could be causing her symptoms so for this reason we are going to restart her losartan.  Begin to  restart her on 25 mg instead of 50 mg to prevent hypotension.  We checked a point-of-care CBG which was 111.  I have recommended reduction in carbohydrates and increasing her activity level to help naturally lower this level as we are unsure why her metformin was stopped.  It suspected that she is been diet controlled however I do want her to be followed up with primary care. Discussed with patient along with red flag signs and symptoms.   Final Clinical Impressions(s) / UC Diagnoses   Final diagnoses:  None   Discharge Instructions   None    ED Prescriptions   None    PDMP not reviewed this encounter.   Hughie Closs, [place] 11/08/20 1028

## 2020-11-11 ENCOUNTER — Ambulatory Visit: Payer: Medicare PPO | Admitting: Allergy & Immunology

## 2020-11-12 ENCOUNTER — Telehealth (HOSPITAL_BASED_OUTPATIENT_CLINIC_OR_DEPARTMENT_OTHER): Payer: Self-pay

## 2020-11-12 NOTE — Telephone Encounter (Signed)
scheduling a NP appt w/ PCP @ [facility] Pt said she was PCP shopping, may call back

## 2020-11-17 ENCOUNTER — Other Ambulatory Visit: Payer: Self-pay

## 2020-11-17 ENCOUNTER — Emergency Department (HOSPITAL_COMMUNITY)
Admission: EM | Admit: 2020-11-17 | Discharge: 2020-11-17 | Disposition: A | Discharge status: Home / Self Care | Payer: Medicare PPO | Attending: Emergency Medicine | Admitting: Emergency Medicine

## 2020-11-17 DIAGNOSIS — E119 Type 2 diabetes mellitus without complications: Secondary | ICD-10-CM | POA: Insufficient documentation

## 2020-11-17 DIAGNOSIS — T6391XA Toxic effect of contact with unspecified venomous animal, accidental (unintentional), initial encounter: Secondary | ICD-10-CM | POA: Insufficient documentation

## 2020-11-17 DIAGNOSIS — R509 Fever, unspecified: Secondary | ICD-10-CM | POA: Insufficient documentation

## 2020-11-17 DIAGNOSIS — I10 Essential (primary) hypertension: Secondary | ICD-10-CM | POA: Diagnosis not present

## 2020-11-17 DIAGNOSIS — Z7984 Long term (current) use of oral hypoglycemic drugs: Secondary | ICD-10-CM | POA: Diagnosis not present

## 2020-11-17 DIAGNOSIS — J45909 Unspecified asthma, uncomplicated: Secondary | ICD-10-CM | POA: Diagnosis not present

## 2020-11-17 DIAGNOSIS — A059 Bacterial foodborne intoxication, unspecified: Secondary | ICD-10-CM

## 2020-11-17 DIAGNOSIS — Z79899 Other long term (current) drug therapy: Secondary | ICD-10-CM | POA: Diagnosis not present

## 2020-11-17 NOTE — Discharge Instructions (Addendum)
You came to the emergency department today to be evaluated for possible toxic exposure.  The animal crackers you ate did not appear to be moldy.  Your physical exam was very reassuring.  Please follow-up with your primary care provider if you have any further concerns.  Please do not continue to eat animal crackers if you have any other further concerns.

## 2020-11-17 NOTE — ED Provider Notes (Signed)
[facility] EMERGENCY DEPARTMENT Provider Note   CSN: [id number] Arrival date & time: 11/17/20  7948     History No chief complaint on file.   Bianca Medina is a 70 y.o. female presents to the emergency department with a chief complaint of fever of toxic exposure.  Patient reports that she thinks she ate some bad animal crackers last night.  These animal crackers were brought from a store and the package was sealed.  Patient states she thinks they were toxic recently had a odd smell and tasted bad.  Patient denies any abdominal pain, nausea, vomiting, rash, trouble swallowing, sore throat, confusion.  HPI     Past Medical History:  Diagnosis Date   Anemia 2003   IRON DEFICIENCY    Asthma    Common migraine 06/23/2019   Diabetes mellitus without complication (HCC)    GERD (gastroesophageal reflux disease)    Hyperlipidemia, mild    Hypertension    NSVD (normal spontaneous vaginal delivery)    X3   Obesity    PMDD (premenstrual dysphoric disorder)    Sleep disorder     Patient Active Problem List   Diagnosis Date Noted   Ear itching 06/27/2019   Common migraine 06/23/2019   History of anemia 03/31/2019   Pre-diabetes 03/31/2019   Viral syndrome 03/31/2019   Sensorineural hearing loss (SNHL), bilateral 08/12/2017   Tinnitus of both ears 08/12/2017   Morbid obesity ([place]) 12/28/2016   Left breast mass 07/19/2014   Breast tenderness in female 07/19/2014   Menopause 01/09/2014   Vaginal atrophy 01/09/2014   Skin lesion 12/23/2012   Essential hypertension 10/31/2012   Heartburn 10/31/2012    Past Surgical History:  Procedure Laterality Date   ABDOMINAL HYSTERECTOMY     GUM SURGERY  1980's   TUBAL LIGATION       OB History     Gravida  3   Para  3   Term      Preterm      AB      Living  3      SAB      IAB      Ectopic      Multiple      Live Births              Family History  Problem Relation Age of Onset   Cancer  Mother        LUNG- SMOKER   Diabetes Father    Hypertension Father    Heart disease Father    Stroke Father    Cancer Paternal Aunt        COLON   Cancer Paternal Uncle        COLON   Breast cancer Sister     Social History   Tobacco Use   Smoking status: Never   Smokeless tobacco: Never  Vaping Use   Vaping Use: Never used  Substance Use Topics   Alcohol use: No   Drug use: No    Home Medications Prior to Admission medications   Medication Sig Start Date End Date Taking? Authorizing Provider  azelastine (ASTELIN) 0.1 % nasal spray Place 1 spray into both nostrils daily.  08/06/17   [provider]  Bioflavonoid Products (BIOFLEX PO) Take 1 tablet by mouth daily.    [provider]  Biotin 5000 MCG CAPS Take 5,000 mcg by mouth daily.     [provider]  chlorthalidone (HYGROTON) 25 MG tablet Take 25  mg by mouth daily.  04/08/16   [provider]  diphenhydrAMINE (BENADRYL) 25 MG tablet Take 1 tablet (25 mg total) by mouth every 6 (six) hours as needed for up to 30 doses (Migraine - take with Reglan). Migraine - take with reglan 10/20/20   Wyvonnia Dusky, MD  esomeprazole (NEXIUM) 40 MG capsule Take 40 mg by mouth daily.     [provider]  flurbiprofen (ANSAID) 100 MG tablet Take 100 mg by mouth 2 (two) times daily. 12/13/19   [provider]  ibuprofen (ADVIL) 600 MG tablet Take 1 tablet (600 mg total) by mouth every 6 (six) hours as needed for headache. 10/20/20 11/19/20  Wyvonnia Dusky, MD  lidocaine (XYLOCAINE) 5 % ointment  06/01/19   [provider]  linaclotide (LINZESS) 145 MCG CAPS capsule Take 145 mcg by mouth daily before breakfast.    [provider]  losartan (COZAAR) 25 MG tablet Take 1 tablet (25 mg total) by mouth daily. 11/08/20   Hughie Closs, PA-C  metFORMIN (GLUCOPHAGE) 500 MG tablet Take 500 mg by mouth daily.     [provider]  metoCLOPramide (REGLAN) 10 MG tablet Take  1 tablet (10 mg total) by mouth every 12 (twelve) hours as needed for up to 10 doses for nausea. 10/20/20   Wyvonnia Dusky, MD  Mineral Oil-Hydrophil Petrolat OINT  06/01/19   [provider]  Multiple Vitamin (MULTI-VITAMIN) tablet Take by mouth.    [provider]  potassium chloride (KLOR-CON) 10 MEQ tablet Take 10 mEq by mouth 2 (two) times daily.    [provider]  PROAIR HFA 108 [phone] Base) MCG/ACT inhaler Inhale 1-2 puffs into the lungs every 4 (four) hours as needed for wheezing or shortness of breath.  03/16/18   [provider]  rosuvastatin (CRESTOR) 20 MG tablet Take 20 mg by mouth daily.    [provider]  vitamin E 1000 UNIT capsule Take 1,000 Units by mouth daily.    [provider]    Allergies    Codeine  Review of Systems   Review of Systems  Constitutional:  Negative for chills and fever.  Respiratory:  Negative for shortness of breath.   Cardiovascular:  Negative for chest pain.  Gastrointestinal:  Negative for abdominal pain, diarrhea and vomiting.  Psychiatric/Behavioral:  Negative for confusion.    Physical Exam Updated Vital Signs BP (!) 160/145 (BP Location: Right Arm)   Pulse 66   Temp 98 F (36.7 C) (Oral)   Resp 17   SpO2 96%   Physical Exam Vitals and nursing note reviewed.  Constitutional:      General: She is not in acute distress.    Appearance: She is not ill-appearing, toxic-appearing or diaphoretic.  HENT:     Head: Normocephalic.     Comments: Patient speaks in full complete sentences without difficulty    Mouth/Throat:     Mouth: Mucous membranes are moist. No lacerations, oral lesions or angioedema.     Pharynx: Oropharynx is clear. Uvula midline. No pharyngeal swelling, oropharyngeal exudate, posterior oropharyngeal erythema or uvula swelling.     Comments: Able to handle oral secretions without any difficulty Eyes:     General: No scleral icterus.       Right eye: No discharge.         Left eye: No discharge.  Cardiovascular:     Rate and Rhythm: Normal rate.  Pulmonary:     Effort: Pulmonary effort  is normal. No tachypnea, bradypnea or respiratory distress.     Breath sounds: Normal breath sounds. No stridor.     Comments: Patient speaks in full complete sentences without difficulty Abdominal:     General: Abdomen is protuberant. Bowel sounds are normal. There is no distension. There are no signs of injury.     Palpations: Abdomen is soft. There is no mass or pulsatile mass.     Tenderness: There is no abdominal tenderness. There is no guarding or rebound.  Musculoskeletal:     Cervical back: Normal range of motion. No edema, erythema, signs of trauma, rigidity, torticollis or crepitus. No pain with movement, spinous process tenderness or muscular tenderness. Normal range of motion.  Skin:    General: Skin is warm and dry.     Comments: No rash noted to patient's chest, abdomen, face, neck, bilateral upper arms  Neurological:     General: No focal deficit present.     Mental Status: She is alert.  Psychiatric:        Behavior: Behavior is cooperative.    ED Results / Procedures / Treatments   Labs (all labs ordered are listed, but only abnormal results are displayed) Labs Reviewed - No data to display  EKG None  Radiology No results found.  Procedures Procedures   Medications Ordered in ED Medications - No data to display  ED Course  I have reviewed the triage vital signs and the nursing notes.  Pertinent labs & imaging results that were available during my care of the patient were reviewed by me and considered in my medical decision making (see chart for details).    MDM Rules/Calculators/A&P                          Alert 70 year old female no acute distress, nontoxic-appearing.  Patient concerned that she had a toxic exposure after eating animal crackers she thinks was bad.  Patient brought these in FROM the store, package was sealed.   Patient has animal crackers in emergency department no signs of mold.   Patient has no symptoms.  Sickle exam is reassuring.  No signs within normal limits.  Will discharge patient at this time.  Patient given strict return precautions.  Patient expressed understanding of all instructions and is agreeable with this plan.  Final Clinical Impression(s) / ED Diagnoses Final diagnoses:  None    Rx / DC Orders ED Discharge Orders     None        Loni Beckwith, PA-C 11/17/20 [id number]    Varney Biles, MD 11/18/20 1121

## 2020-11-17 NOTE — ED Triage Notes (Signed)
Patient states that she thinks she ate bad animal crackers last night. States they smelled odd-patient has no symptoms. No nausea, no abdominal pain

## 2020-11-19 ENCOUNTER — Ambulatory Visit: Payer: Medicare PPO | Admitting: Neurology

## 2020-11-19 ENCOUNTER — Other Ambulatory Visit: Payer: Self-pay

## 2020-11-19 ENCOUNTER — Encounter: Payer: Self-pay | Admitting: Neurology

## 2020-11-19 VITALS — BP 123/67 | HR 60 | Ht 64.0 in | Wt 271.0 lb

## 2020-11-19 DIAGNOSIS — G479 Sleep disorder, unspecified: Secondary | ICD-10-CM | POA: Diagnosis not present

## 2020-11-19 DIAGNOSIS — G43009 Migraine without aura, not intractable, without status migrainosus: Secondary | ICD-10-CM | POA: Diagnosis not present

## 2020-11-19 MED ORDER — TOPIRAMATE 25 MG PO TABS
ORAL_TABLET | ORAL | 3 refills | Status: DC
Start: 2020-11-19 — End: 2021-01-27

## 2020-11-19 NOTE — Patient Instructions (Signed)
We will start Topamax for the headache.   Topamax (topiramate) is a seizure medication that has an FDA approval for seizures and for migraine headache. Potential side effects of this medication include weight loss, cognitive slowing, tingling in the fingers and toes, and carbonated drinks will taste bad. If any significant side effects are noted on this drug, please contact our office.  

## 2020-11-19 NOTE — Progress Notes (Signed)
Reason for visit: Headache  Referring physician: [place]  Bianca Medina is a 70 y.o. female  History of present illness:  Bianca Medina is a 70 year old right-handed black female with a history of headache.  The patient was seen on 19 October 2020 in the emergency room with her headaches.  The patient was last seen here in February 2021 with similar problems.  The patient has had some worsening of headaches over the last year and a half.  She indicates that a lot of her headaches occur out of sleep, but they can occur during the day as well.  The patient has a "surge" feeling in the right side of her head at times that will come and go, she may have her usual headaches in the right in front of the head, she will take an aspirin for this.  The surge type feeling oftentimes leads to a headache.  The patient also has sharp lancinating type pains that may occur in the temporal area or behind the eye, she was having these previously as well.  The patient has had MRI of the brain and CT scan of the brain that were relatively unremarkable.  She feels that her balance is slightly off, she denies any neck stiffness or any pulsatile tinnitus or hearing changes.  She does have some slight discomfort in the right ear.  She may have some cramps in the right hand and in the legs that may occur at nighttime as well.  She is not sleeping well, she will go to sleep quickly but may wake up frequently during the night, sometimes with a headache.  She denies issues controlling the bowels or the bladder.  She reports no numbness or weakness, she denies any nausea or vomiting or photophobia or phonophobia with the headache.  Past Medical History:  Diagnosis Date   Anemia 2003   IRON DEFICIENCY    Asthma    Common migraine 06/23/2019   Diabetes mellitus without complication (HCC)    GERD (gastroesophageal reflux disease)    Hyperlipidemia, mild    Hypertension    NSVD (normal spontaneous vaginal delivery)    X3    Obesity    PMDD (premenstrual dysphoric disorder)    Sleep disorder     Past Surgical History:  Procedure Laterality Date   ABDOMINAL HYSTERECTOMY     GUM SURGERY  1980's   TUBAL LIGATION      Family History  Problem Relation Age of Onset   Cancer Mother        LUNG- SMOKER   Diabetes Father    Hypertension Father    Heart disease Father    Stroke Father    Cancer Paternal Aunt        COLON   Cancer Paternal Uncle        COLON   Breast cancer Sister     Social history:  reports that she has never smoked. She has never used smokeless tobacco. She reports that she does not drink alcohol and does not use drugs.  Medications:  Prior to Admission medications   Medication Sig Start Date End Date Taking? Authorizing Provider  Bioflavonoid Products (BIOFLEX PO) Take 1 tablet by mouth daily.   Yes [provider]  Biotin 5000 MCG CAPS Take 5,000 mcg by mouth daily.    Yes [provider]  diphenhydrAMINE (BENADRYL) 25 MG tablet Take 1 tablet (25 mg total) by mouth every 6 (six) hours as needed for up to  30 doses (Migraine - take with Reglan). Migraine - take with reglan 10/20/20  Yes Trifan, Carola Rhine, MD  esomeprazole (NEXIUM) 40 MG capsule Take 40 mg by mouth daily.    Yes [provider]  fluticasone (FLONASE) 50 MCG/ACT nasal spray Place 1 spray into both nostrils daily. 11/14/20  Yes [provider]  ibuprofen (ADVIL) 600 MG tablet Take 1 tablet (600 mg total) by mouth every 6 (six) hours as needed for headache. 10/20/20 11/19/20 Yes Trifan, Carola Rhine, MD  levocetirizine (XYZAL) 5 MG tablet Take 5 mg by mouth daily. 11/14/20  Yes [provider]  linaclotide (LINZESS) 145 MCG CAPS capsule Take 145 mcg by mouth daily before breakfast.   Yes [provider]  losartan (COZAAR) 25 MG tablet Take 1 tablet (25 mg total) by mouth daily. 11/08/20  Yes Covington, Sarah M, PA-C  montelukast (SINGULAIR) 10 MG tablet Take 10 mg by mouth daily.  11/14/20  Yes [provider]  Multiple Vitamin (MULTI-VITAMIN) tablet Take by mouth.   Yes [provider]  PROAIR HFA 108 (90 Base) MCG/ACT inhaler Inhale 1-2 puffs into the lungs every 4 (four) hours as needed for wheezing or shortness of breath.  03/16/18  Yes [provider]  SYMBICORT 80-4.5 MCG/ACT inhaler Inhale into the lungs. 11/16/20  Yes [provider]  vitamin E 1000 UNIT capsule Take 1,000 Units by mouth daily.   Yes [provider]      Allergies  Allergen Reactions   Codeine Other (See Comments)    nightmares Other reaction(s): Other nightmares nightmares    ROS:  Out of a complete 14 system review of symptoms, the patient complains only of the following symptoms, and all other reviewed systems are negative.  Headache Fatigue, drowsiness Swelling in the legs Muscle cramps  Blood pressure 123/67, pulse 60, height 5\' 4"  (1.626 m), weight 271 lb (122.9 kg).  Physical Exam  General: The patient is alert and cooperative at the time of the examination.  The patient is moderately to markedly obese.  Eyes: Pupils are equal, round, and reactive to light. Discs are flat bilaterally.  Neck: The neck is supple, no carotid bruits are noted.  Respiratory: The respiratory examination is clear.  Cardiovascular: The cardiovascular examination reveals a regular rate and rhythm, no obvious murmurs or rubs are noted.  Skin: Extremities are with 1+ edema below the knees.  Neurologic Exam  Mental status: The patient is alert and oriented x 3 at the time of the examination. The patient has apparent normal recent and remote memory, with an apparently normal attention span and concentration ability.  Cranial nerves: Facial symmetry is present. There is good sensation of the face to pinprick and soft touch bilaterally. The strength of the facial muscles and the muscles to head turning and shoulder shrug are normal bilaterally. Speech is  well enunciated, no aphasia or dysarthria is noted. Extraocular movements are full. Visual fields are full. The tongue is midline, and the patient has symmetric elevation of the soft palate. No obvious hearing deficits are noted.  Motor: The motor testing reveals 5 over 5 strength of all 4 extremities. Good symmetric motor tone is noted throughout.  Sensory: Sensory testing is intact to pinprick, soft touch, vibration sensation, and position sense on all 4 extremities. No evidence of extinction is noted.  Coordination: Cerebellar testing reveals good finger-nose-finger and heel-to-shin bilaterally.  Gait and station: Gait is normal. Tandem gait is normal. Romberg is negative. No drift is seen.  Reflexes: Deep tendon reflexes are symmetric, but are depressed bilaterally. Toes are downgoing bilaterally.   CT head 10/19/20:  IMPRESSION: Normal head CT.    * CT scan images were reviewed online. I agree with the written report.    Assessment/Plan:  1.  Frequent headache  The patient has different head symptoms, she has a surgical type feeling that will lead to a headache, she will have sharp lancinating pains that simulate "ice pick pains".  In the past, she has indicated that weather changes may bring on headache, currently she is having headaches mainly in the evening hours when she is drifting off to sleep or when she has already gone to sleep.  She may have some headaches during the day as well.  Given the excessive drowsiness during the day, and frequent wakening and nocturnal headaches, I will set this patient up for sleep evaluation.  She will be placed on Topamax at night for the headache.  She will follow-up here in 4 months, in the future she may see Dr. Jaynee Eagles.  Jill Alexanders MD 11/19/2020 12:23 PM  [facility] [address] [place], [place] [address]  Phone [phone] Fax [phone]

## 2020-11-27 ENCOUNTER — Encounter: Payer: Self-pay | Admitting: Family Medicine

## 2020-11-27 ENCOUNTER — Ambulatory Visit: Payer: Medicare PPO | Admitting: Family Medicine

## 2020-11-27 ENCOUNTER — Other Ambulatory Visit: Payer: Self-pay

## 2020-11-27 VITALS — BP 154/80 | HR 71 | Temp 97.1°F | Ht 64.0 in | Wt 272.4 lb

## 2020-11-27 DIAGNOSIS — I10 Essential (primary) hypertension: Secondary | ICD-10-CM

## 2020-11-27 DIAGNOSIS — Z0000 Encounter for general adult medical examination without abnormal findings: Secondary | ICD-10-CM | POA: Diagnosis not present

## 2020-11-27 DIAGNOSIS — R7303 Prediabetes: Secondary | ICD-10-CM

## 2020-11-27 DIAGNOSIS — E559 Vitamin D deficiency, unspecified: Secondary | ICD-10-CM | POA: Diagnosis not present

## 2020-11-27 LAB — COMPREHENSIVE METABOLIC PANEL
ALT: 22 U/L (ref 0–35)
AST: 17 U/L (ref 0–37)
Albumin: 3.8 g/dL (ref 3.5–5.2)
Alkaline Phosphatase: 101 U/L (ref 39–117)
BUN: 14 mg/dL (ref 6–23)
CO2: 30 mEq/L (ref 19–32)
Calcium: 9 mg/dL (ref 8.4–10.5)
Chloride: 105 mEq/L (ref 96–112)
Creatinine, Ser: 0.74 mg/dL (ref 0.40–1.20)
GFR: 83.03 mL/min (ref >60.00)
Glucose, Bld: 114 mg/dL — ABNORMAL HIGH (ref 70–99)
Potassium: 4.3 mEq/L (ref 3.5–5.1)
Sodium: 142 mEq/L (ref 135–145)
Total Bilirubin: 0.4 mg/dL (ref 0.2–1.2)
Total Protein: 6.7 g/dL (ref 6.0–8.3)

## 2020-11-27 LAB — URINALYSIS, ROUTINE W REFLEX MICROSCOPIC
Bilirubin Urine: NEGATIVE
Hgb urine dipstick: NEGATIVE
Ketones, ur: NEGATIVE
Leukocytes,Ua: NEGATIVE
Nitrite: NEGATIVE
RBC / HPF: NONE SEEN (ref 0–?)
Specific Gravity, Urine: 1.025 (ref 1.000–1.030)
Total Protein, Urine: NEGATIVE
Urine Glucose: NEGATIVE
Urobilinogen, UA: 0.2 (ref 0.0–1.0)
pH: 6 (ref 5.0–8.0)

## 2020-11-27 LAB — LIPID PANEL
Cholesterol: 171 mg/dL (ref 0–200)
HDL: 50.3 mg/dL (ref >39.00)
LDL Cholesterol: 109 mg/dL — ABNORMAL HIGH (ref 0–99)
NonHDL: 120.49
Total CHOL/HDL Ratio: 3
Triglycerides: 56 mg/dL (ref 0.0–149.0)
VLDL: 11.2 mg/dL (ref 0.0–40.0)

## 2020-11-27 LAB — HEMOGLOBIN A1C: Hgb A1c MFr Bld: 6.8 % — ABNORMAL HIGH (ref 4.6–6.5)

## 2020-11-27 LAB — VITAMIN D 25 HYDROXY (VIT D DEFICIENCY, FRACTURES): VITD: 15.32 ng/mL — ABNORMAL LOW (ref 30.00–100.00)

## 2020-11-27 LAB — MICROALBUMIN / CREATININE URINE RATIO
Creatinine,U: 156.9 mg/dL
Microalb Creat Ratio: 0.6 mg/g (ref 0.0–30.0)
Microalb, Ur: 0.9 mg/dL (ref 0.0–1.9)

## 2020-11-27 MED ORDER — CHLORTHALIDONE 25 MG PO TABS: 25.0000 mg | ORAL_TABLET | Freq: Every day | ORAL | 1 refills | Status: DC

## 2020-11-27 MED ORDER — LOSARTAN POTASSIUM 25 MG PO TABS: 25.0000 mg | ORAL_TABLET | Freq: Every day | ORAL | 0 refills | Status: DC

## 2020-11-27 NOTE — Progress Notes (Addendum)
New Patient Office Visit  Subjective:  Patient ID: Bianca Medina, female    DOB: 1950/10/18  Age: 70 y.o. MRN: [id number]  CC:  Chief Complaint  Patient presents with   Transitions Of Care    TOC from Dr. Marlou Sa recent urgent care, seen for headaches, cramps in legs. Patient would like to go over medications, also would like blood work. Fasting today.     HPI DNIYA NEUHAUS presents for establishment of care.  No primary care for quite some time.  She did have a Pap smear at the end of this past year.  She is already seeing gastroenterology for IBS with constipation and we will schedule her colonoscopy through them.  She is seeing an allergist for asthma and allergy rhinitis.  She has been diagnosed with prediabetes and was given metformin but that was discontinued.  Blood pressure has been treated with losartan and chlorthalidone.  She is no longer taking the chlorthalidone.  She is seeing neurology for headache and sleep difficulties.  A sleep study is scheduled per neurology.  She was prescribed Topamax for migraine headaches per neurology.   Past Medical History:  Diagnosis Date   Anemia 2003   IRON DEFICIENCY    Asthma    Common migraine 06/23/2019   Diabetes mellitus without complication (HCC)    GERD (gastroesophageal reflux disease)    Hyperlipidemia, mild    Hypertension    NSVD (normal spontaneous vaginal delivery)    X3   Obesity    PMDD (premenstrual dysphoric disorder)    Sleep disorder     Past Surgical History:  Procedure Laterality Date   ABDOMINAL HYSTERECTOMY     GUM SURGERY  1980's   TUBAL LIGATION      Family History  Problem Relation Age of Onset   Cancer Mother        LUNG- SMOKER   Diabetes Father    Hypertension Father    Heart disease Father    Stroke Father    Cancer Paternal Aunt        COLON   Cancer Paternal Uncle        COLON   Breast cancer Sister     Social History   Socioeconomic History   Marital status: Single    Spouse name:  Not on file   Number of children: 3   Years of education: college   Highest education level: Not on file  Occupational History   Occupation: Architectural technologist: [employer]  Tobacco Use   Smoking status: Never   Smokeless tobacco: Never  Vaping Use   Vaping Use: Never used  Substance and Sexual Activity   Alcohol use: No   Drug use: No   Sexual activity: Not Currently    Birth control/protection: Surgical    Comment: HYSTERECTOMY-1st intercourse 89 yo-5 partners  Other Topics Concern   Not on file  Social History Narrative   Right handed     Caffeine use: has a drink every other day   Social Determinants of Health   Financial Resource Strain: Not on file  Food Insecurity: Not on file  Transportation Needs: Not on file  Physical Activity: Not on file  Stress: Not on file  Social Connections: Not on file  Intimate Partner Violence: Not on file    ROS Review of Systems  Constitutional: Negative.   HENT: Negative.    Eyes:  Negative for photophobia and visual disturbance.  Respiratory:  Negative.    Cardiovascular: Negative.   Gastrointestinal: Negative.   Endocrine: Negative for polyphagia and polyuria.  Neurological:  Positive for headaches.   Depression screen [facility] 2/9 11/27/2020 11/27/2020 11/20/2019  Decreased Interest 0 0 0  Down, Depressed, Hopeless 0 0 0  PHQ - 2 Score 0 0 0  Altered sleeping 3 - 2  Tired, decreased energy 1 - 0  Change in appetite 2 - 1  Feeling bad or failure about yourself  0 - 0  Trouble concentrating 1 - 1  Moving slowly or fidgety/restless 0 - 0  Suicidal thoughts 0 - 0  PHQ-9 Score 7 - 4  Difficult doing work/chores Not difficult at all - Somewhat difficult  Today I just saw him    Objective:   Today's Vitals: BP (!) 154/80   Pulse 71   Temp (!) 97.1 F (36.2 C) (Temporal)   Ht 5\' 4"  (1.626 m)   Wt 272 lb 6.4 oz (123.6 kg)   SpO2 95%   BMI 46.76 kg/m   Physical Exam Vitals and  nursing note reviewed.  Constitutional:      General: She is not in acute distress.    Appearance: Normal appearance. She is obese. She is not ill-appearing, toxic-appearing or diaphoretic.  HENT:     Head: Normocephalic and atraumatic.     Right Ear: Tympanic membrane, ear canal and external ear normal.     Left Ear: Tympanic membrane, ear canal and external ear normal.     Mouth/Throat:     Mouth: Mucous membranes are moist.     Pharynx: Oropharynx is clear. No oropharyngeal exudate or posterior oropharyngeal erythema.  Eyes:     General:        Right eye: No discharge.        Left eye: No discharge.     Extraocular Movements: Extraocular movements intact.     Conjunctiva/sclera: Conjunctivae normal.     Pupils: Pupils are equal, round, and reactive to light.  Neck:     Vascular: No carotid bruit.  Cardiovascular:     Rate and Rhythm: Normal rate and regular rhythm.  Pulmonary:     Effort: Pulmonary effort is normal. No respiratory distress.     Breath sounds: Normal breath sounds. No wheezing.  Abdominal:     General: Bowel sounds are normal.  Musculoskeletal:     Cervical back: No rigidity or tenderness.  Lymphadenopathy:     Cervical: No cervical adenopathy.  Skin:    General: Skin is warm and dry.  Neurological:     Mental Status: She is alert and oriented to person, place, and time.  Psychiatric:        Mood and Affect: Mood normal.        Behavior: Behavior normal.    Assessment & Plan:   Problem List Items Addressed This Visit       Cardiovascular and Mediastinum   Essential hypertension - Primary   Relevant Medications   losartan (COZAAR) 25 MG tablet   chlorthalidone (HYGROTON) 25 MG tablet   Other Relevant Orders   CBC (Completed)   Comprehensive metabolic panel (Completed)   Urinalysis, Routine w reflex microscopic (Completed)   Microalbumin / creatinine urine ratio (Completed)     Other   Morbid obesity (HCC)   Relevant Medications   metFORMIN  (GLUCOPHAGE XR) 500 MG 24 hr tablet   Vitamin D, Ergocalciferol, (DRISDOL) 1.25 MG (50000 UNIT) CAPS capsule   Other Relevant Orders   VITAMIN  D 25 Hydroxy (Vit-D Deficiency, Fractures) (Completed)   Amb ref to Medical Nutrition Therapy-MNT   Pre-diabetes   Relevant Medications   metFORMIN (GLUCOPHAGE XR) 500 MG 24 hr tablet   Other Relevant Orders   Hemoglobin A1c (Completed)   Urinalysis, Routine w reflex microscopic (Completed)   Microalbumin / creatinine urine ratio (Completed)   Healthcare maintenance   Relevant Orders   Lipid panel (Completed)   Vitamin D deficiency   Relevant Medications   Vitamin D, Ergocalciferol, (DRISDOL) 1.25 MG (50000 UNIT) CAPS capsule    Outpatient Encounter Medications as of 11/27/2020  Medication Sig   Bioflavonoid Products (BIOFLEX PO) Take 1 tablet by mouth daily.   Biotin 5000 MCG CAPS Take 5,000 mcg by mouth daily.    chlorthalidone (HYGROTON) 25 MG tablet Take 1 tablet (25 mg total) by mouth daily.   esomeprazole (NEXIUM) 40 MG capsule Take 40 mg by mouth daily.    linaclotide (LINZESS) 145 MCG CAPS capsule Take 145 mcg by mouth daily before breakfast.   metFORMIN (GLUCOPHAGE XR) 500 MG 24 hr tablet Take 1 tablet (500 mg total) by mouth daily with breakfast.   mometasone-formoterol (DULERA) 100-5 MCG/ACT AERO Inhale 2 puffs into the lungs 2 (two) times daily.   PROAIR HFA 108 (90 Base) MCG/ACT inhaler Inhale 1-2 puffs into the lungs every 4 (four) hours as needed for wheezing or shortness of breath.    Vitamin D, Ergocalciferol, (DRISDOL) 1.25 MG (50000 UNIT) CAPS capsule Take 1 capsule (50,000 Units total) by mouth every 7 (seven) days.   [DISCONTINUED] losartan (COZAAR) 25 MG tablet Take 1 tablet (25 mg total) by mouth daily.   fluticasone (FLONASE) 50 MCG/ACT nasal spray Place 1 spray into both nostrils daily. (Patient not taking: Reported on 11/27/2020)   losartan (COZAAR) 25 MG tablet Take 1 tablet (25 mg total) by mouth daily.   montelukast  (SINGULAIR) 10 MG tablet Take 10 mg by mouth daily. (Patient not taking: Reported on 11/27/2020)   SYMBICORT 80-4.5 MCG/ACT inhaler Inhale into the lungs. (Patient not taking: Reported on 11/27/2020)   topiramate (TOPAMAX) 25 MG tablet Take one tablet at night for one week, then take 2 tablets at night for one week, then take 3 tablets at night. (Patient not taking: Reported on 11/27/2020)   [DISCONTINUED] diphenhydrAMINE (BENADRYL) 25 MG tablet Take 1 tablet (25 mg total) by mouth every 6 (six) hours as needed for up to 30 doses (Migraine - take with Reglan). Migraine - take with reglan (Patient not taking: Reported on 11/27/2020)   [DISCONTINUED] levocetirizine (XYZAL) 5 MG tablet Take 5 mg by mouth daily. (Patient not taking: Reported on 11/27/2020)   [DISCONTINUED] Multiple Vitamin (MULTI-VITAMIN) tablet Take by mouth. (Patient not taking: Reported on 11/27/2020)   [DISCONTINUED] vitamin E 1000 UNIT capsule Take 1,000 Units by mouth daily. (Patient not taking: Reported on 11/27/2020)   No facility-administered encounter medications on file as of 11/27/2020.    Follow-up: Return in about 3 months (around 02/27/2021).  Have restarted chlorthalidone with losartan for her blood pressure.  She agrees to go for nutrition counseling and checking a vitamin D level.  TSH was checked recently and it is normal.  Rechecking hemoglobin A1c.  May restart Glucophage.  She will continue follow-up with her GYN provider, gastroenterology and allergy and neurology.  Libby Maw, MD

## 2020-11-28 LAB — CBC
HCT: 38.4 % (ref 36.0–46.0)
Hemoglobin: 12.8 g/dL (ref 12.0–15.0)
MCHC: 33.3 g/dL (ref 30.0–36.0)
MCV: 76.3 fl — ABNORMAL LOW (ref 78.0–100.0)
Platelets: 212 10*3/uL (ref 150.0–400.0)
RBC: 5.03 Mil/uL (ref 3.87–5.11)
RDW: 15.9 % — ABNORMAL HIGH (ref 11.5–15.5)
WBC: 9.5 10*3/uL (ref 4.0–10.5)

## 2020-12-01 DIAGNOSIS — E559 Vitamin D deficiency, unspecified: Secondary | ICD-10-CM | POA: Insufficient documentation

## 2020-12-01 DIAGNOSIS — Z0000 Encounter for general adult medical examination without abnormal findings: Secondary | ICD-10-CM | POA: Insufficient documentation

## 2020-12-01 MED ORDER — VITAMIN D (ERGOCALCIFEROL) 1.25 MG (50000 UNIT) PO CAPS: 50000.0000 [IU] | ORAL_CAPSULE | ORAL | 1 refills | Status: DC

## 2020-12-01 MED ORDER — METFORMIN HCL ER 500 MG PO TB24: 500.0000 mg | ORAL_TABLET | Freq: Every day | ORAL | 1 refills | Status: DC

## 2020-12-01 NOTE — Addendum Note (Signed)
Addended by: Abelino Derrick A on: 12/01/2020 05:30 PM   Modules accepted: Orders

## 2020-12-01 NOTE — Progress Notes (Signed)
Lab work revealed that patient is now diabetic. Vit d levels are low as well. Have restarted glucophage. Have also ordered a high dose Vit D tablet to be taken weekly. Follow up in 3 months as planned.

## 2020-12-02 ENCOUNTER — Telehealth: Payer: Self-pay

## 2020-12-02 NOTE — Telephone Encounter (Signed)
Pt wanted to know if Dr Ethelene Hal meant to prescribe her Metformin. She said her previous Dr had taken her off that medication and she wanted to make sure this is correct.

## 2020-12-03 NOTE — Telephone Encounter (Signed)
Patient wanting to clarify taking Metformin she states that she was on this medication before and then taken off. Wanted to make sure you wanted her to start back on it.

## 2020-12-03 NOTE — Telephone Encounter (Signed)
Please advise message below  °

## 2020-12-04 ENCOUNTER — Encounter: Payer: Self-pay | Admitting: Family Medicine

## 2020-12-04 ENCOUNTER — Ambulatory Visit: Payer: Medicare PPO

## 2020-12-04 ENCOUNTER — Other Ambulatory Visit: Payer: Self-pay

## 2020-12-04 VITALS — BP 132/70 | HR 59 | Temp 97.2°F | Ht 64.0 in | Wt 267.8 lb

## 2020-12-04 DIAGNOSIS — T148XXA Other injury of unspecified body region, initial encounter: Secondary | ICD-10-CM

## 2020-12-04 NOTE — Telephone Encounter (Signed)
Patient aware of message below.

## 2020-12-04 NOTE — Progress Notes (Deleted)
Patient here for small bruise on right medial thigh looked at and asked to return if continued bruising appears. Patient verbally understood.

## 2020-12-04 NOTE — Progress Notes (Signed)
Patient here for small bruise on right medial thigh looked at and asked to return if continued bruising appears. Patient verbally understood

## 2020-12-17 ENCOUNTER — Ambulatory Visit: Payer: Medicare PPO | Admitting: Family Medicine

## 2020-12-22 ENCOUNTER — Ambulatory Visit (HOSPITAL_COMMUNITY)
Admission: EM | Admit: 2020-12-22 | Discharge: 2020-12-22 | Disposition: A | Discharge status: Other Acute Inpatient Hospital | Payer: Medicare PPO | Attending: Emergency Medicine | Admitting: Emergency Medicine

## 2020-12-22 ENCOUNTER — Encounter (HOSPITAL_COMMUNITY): Payer: Self-pay | Admitting: Emergency Medicine

## 2020-12-22 ENCOUNTER — Other Ambulatory Visit: Payer: Self-pay

## 2020-12-22 ENCOUNTER — Emergency Department (HOSPITAL_COMMUNITY)
Admission: EM | Admit: 2020-12-22 | Discharge: 2020-12-22 | Disposition: A | Discharge status: Home / Self Care | Payer: Medicare PPO | Attending: Emergency Medicine | Admitting: Emergency Medicine

## 2020-12-22 ENCOUNTER — Emergency Department (HOSPITAL_COMMUNITY): Payer: Medicare PPO

## 2020-12-22 DIAGNOSIS — R42 Dizziness and giddiness: Secondary | ICD-10-CM | POA: Insufficient documentation

## 2020-12-22 DIAGNOSIS — Z7984 Long term (current) use of oral hypoglycemic drugs: Secondary | ICD-10-CM | POA: Insufficient documentation

## 2020-12-22 DIAGNOSIS — Z7951 Long term (current) use of inhaled steroids: Secondary | ICD-10-CM | POA: Diagnosis not present

## 2020-12-22 DIAGNOSIS — I10 Essential (primary) hypertension: Secondary | ICD-10-CM | POA: Diagnosis not present

## 2020-12-22 DIAGNOSIS — Z79899 Other long term (current) drug therapy: Secondary | ICD-10-CM | POA: Insufficient documentation

## 2020-12-22 DIAGNOSIS — J45909 Unspecified asthma, uncomplicated: Secondary | ICD-10-CM | POA: Insufficient documentation

## 2020-12-22 DIAGNOSIS — E119 Type 2 diabetes mellitus without complications: Secondary | ICD-10-CM | POA: Diagnosis not present

## 2020-12-22 LAB — BASIC METABOLIC PANEL
Anion gap: 7 (ref 5–15)
BUN: 22 mg/dL (ref 8–23)
CO2: 29 mmol/L (ref 22–32)
Calcium: 8.8 mg/dL — ABNORMAL LOW (ref 8.9–10.3)
Chloride: 103 mmol/L (ref 98–111)
Creatinine, Ser: 1.03 mg/dL — ABNORMAL HIGH (ref 0.44–1.00)
GFR, Estimated: 59 mL/min — ABNORMAL LOW (ref >60)
Glucose, Bld: 137 mg/dL — ABNORMAL HIGH (ref 70–99)
Potassium: 3.4 mmol/L — ABNORMAL LOW (ref 3.5–5.1)
Sodium: 139 mmol/L (ref 135–145)

## 2020-12-22 LAB — CBC WITH DIFFERENTIAL/PLATELET
Abs Immature Granulocytes: 0.04 10*3/uL (ref 0.00–0.07)
Basophils Absolute: 0.1 10*3/uL (ref 0.0–0.1)
Basophils Relative: 1 %
Eosinophils Absolute: 0.2 10*3/uL (ref 0.0–0.5)
Eosinophils Relative: 2 %
HCT: 38.3 % (ref 36.0–46.0)
Hemoglobin: 12.9 g/dL (ref 12.0–15.0)
Immature Granulocytes: 0 %
Lymphocytes Relative: 23 %
Lymphs Abs: 2.6 10*3/uL (ref 0.7–4.0)
MCH: 25.3 pg — ABNORMAL LOW (ref 26.0–34.0)
MCHC: 33.7 g/dL (ref 30.0–36.0)
MCV: 75.1 fL — ABNORMAL LOW (ref 80.0–100.0)
Monocytes Absolute: 0.7 10*3/uL (ref 0.1–1.0)
Monocytes Relative: 6 %
Neutro Abs: 8 10*3/uL — ABNORMAL HIGH (ref 1.7–7.7)
Neutrophils Relative %: 68 %
Platelets: 270 10*3/uL (ref 150–400)
RBC: 5.1 MIL/uL (ref 3.87–5.11)
RDW: 14.6 % (ref 11.5–15.5)
WBC: 11.6 10*3/uL — ABNORMAL HIGH (ref 4.0–10.5)
nRBC: 0 % (ref 0.0–0.2)

## 2020-12-22 IMAGING — CT CT HEAD W/O CM
4 series · 17 of 47 positions shown, 19 images · non-contrast
Comparison: [DATE]

CLINICAL DATA: Nonspecific dizziness.

EXAM:
CT HEAD WITHOUT CONTRAST
TECHNIQUE: Contiguous axial images were obtained from the base of the skull
through the vertex without intravenous contrast.

[Series 3: head without · axial · non-contrast · 0.44mm/px · z∈[-87,+38]mm · 7 of 35 slices shown, 9 images]
[im 5/35  brain]
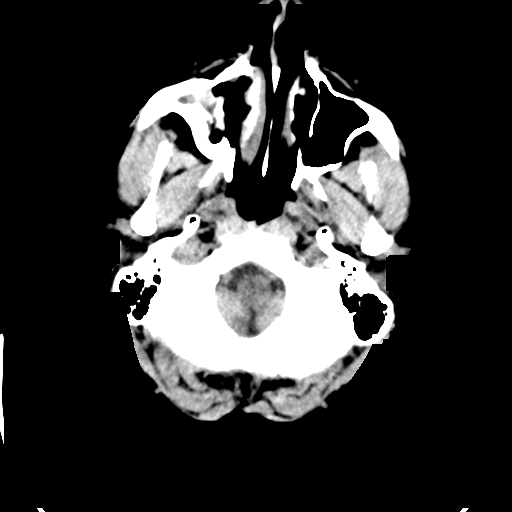
[im 5/35  bone]
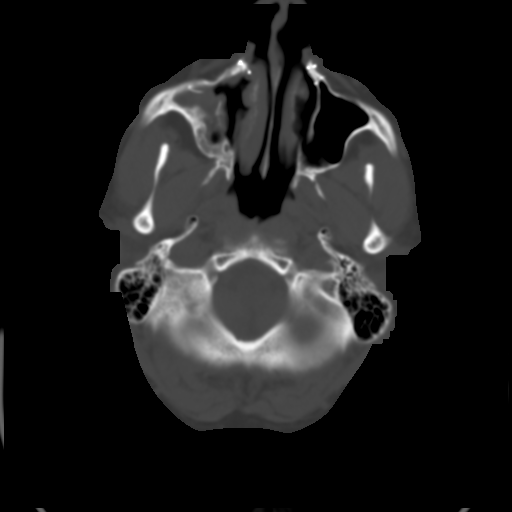
[im 9/35  brain]
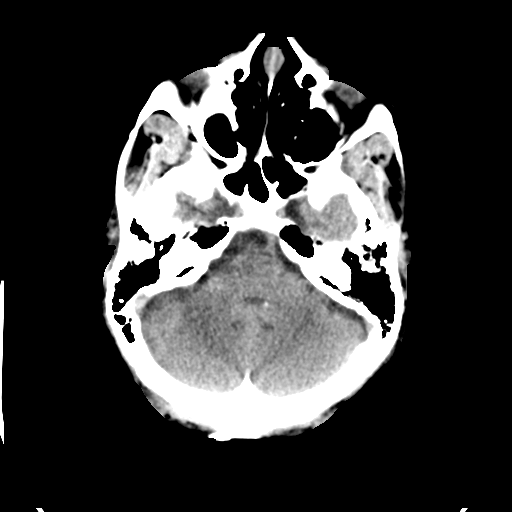
[im 13/35  brain]
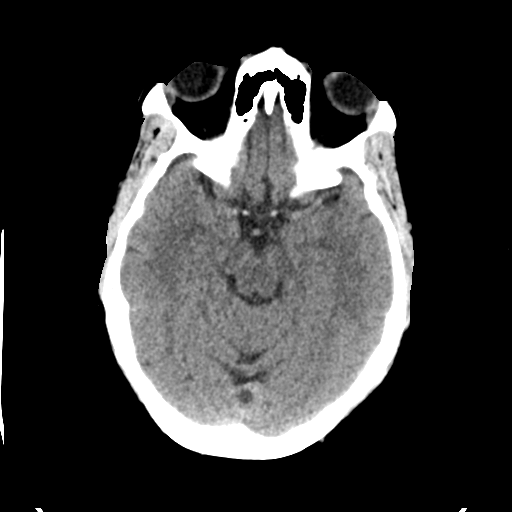
[im 18/35  brain]
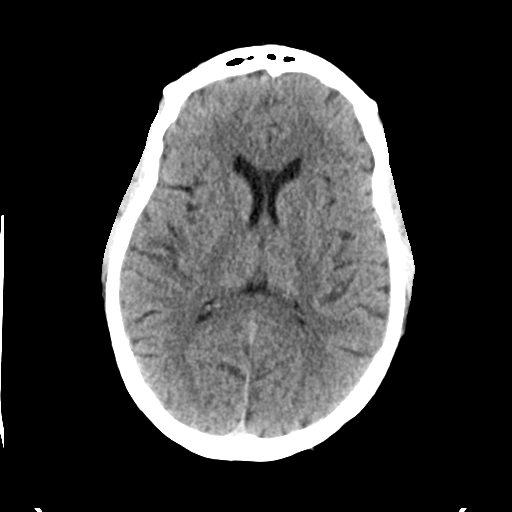
[im 22/35  brain]
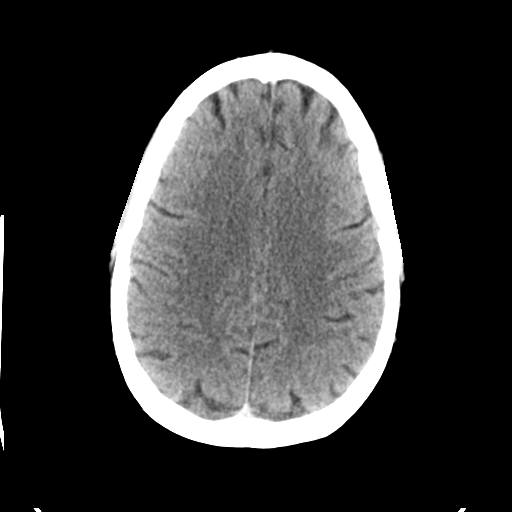
[im 22/35  bone]
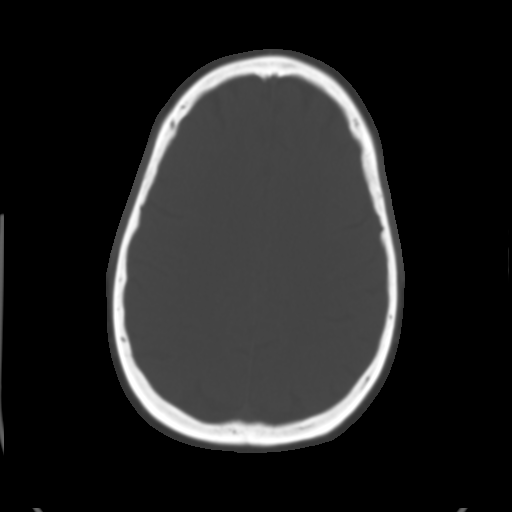
[im 26/35  brain]
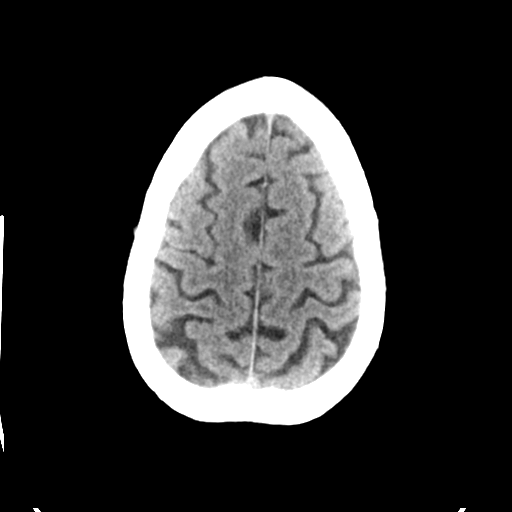
[im 30/35  brain]
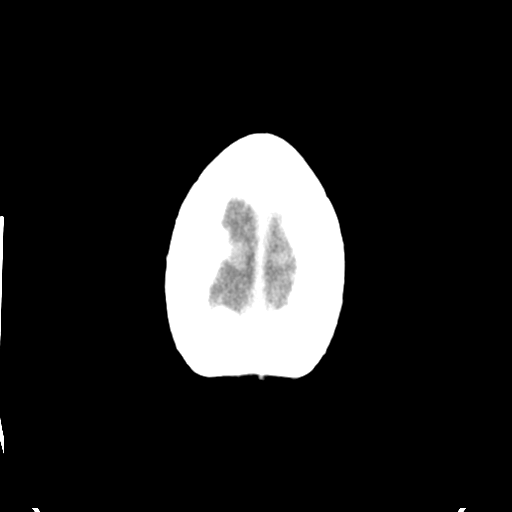

[Series 4: head bone · axial · 0.44mm/px · z∈[-91,-29]mm · 4 of 88 slices shown]
[im 9/88  bone]
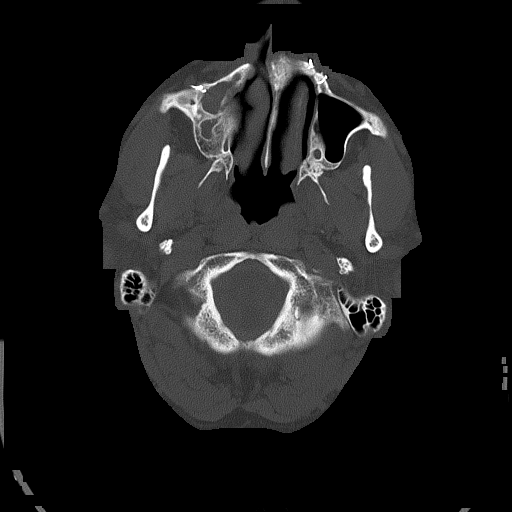
[im 18/88  bone]
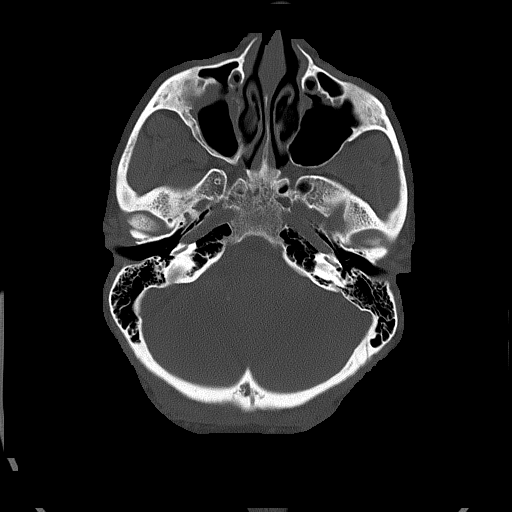
[im 27/88  bone]
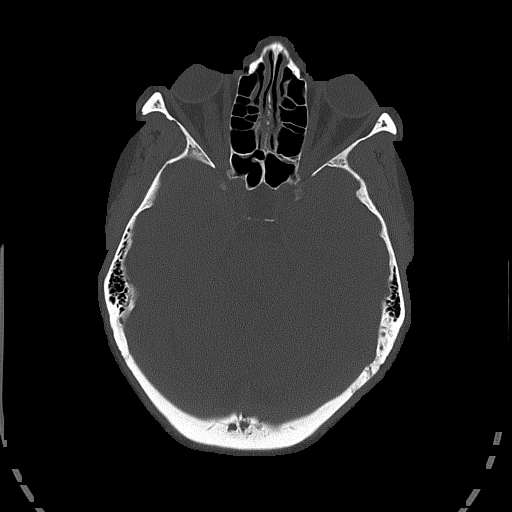
[im 40/88  bone]
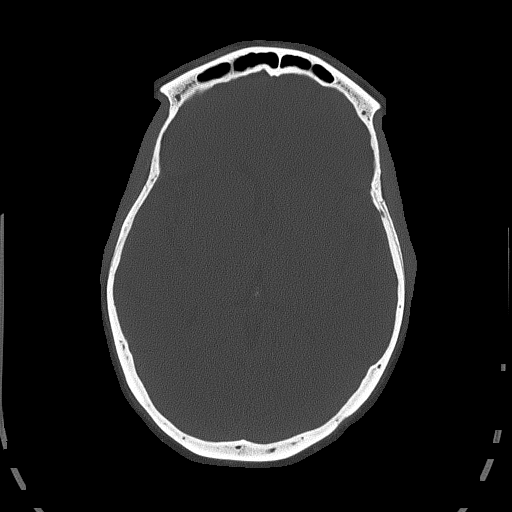

[Series 5: head without cor · coronal · non-contrast · 0.34mm/px · 3 of 69 slices shown]
[im 25/69  brain]
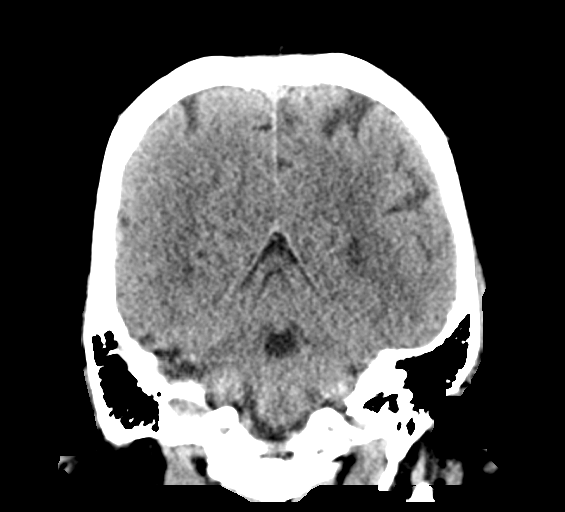
[im 31/69  brain]
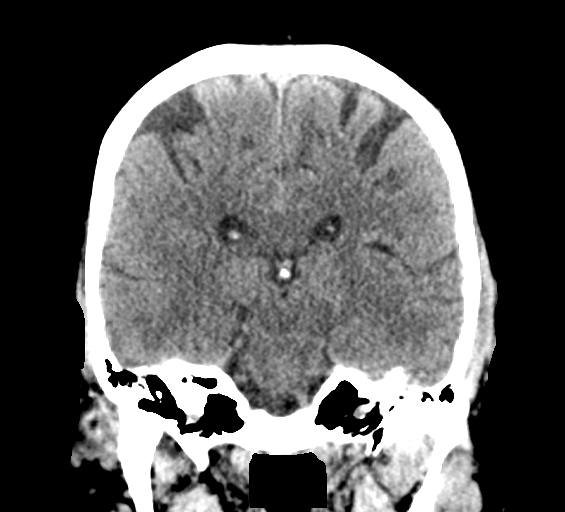
[im 38/69  brain]
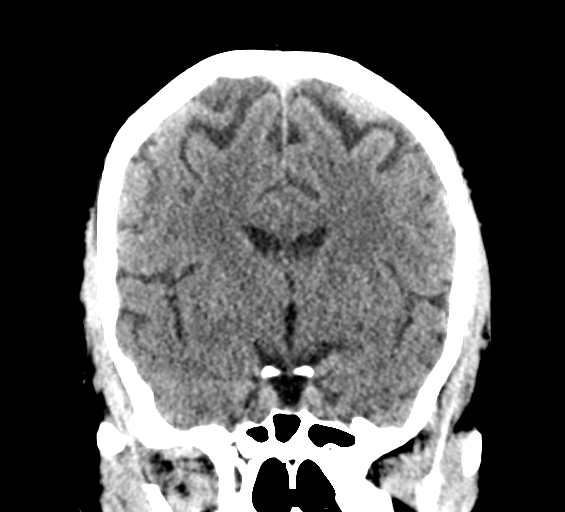

[Series 6: head without sag · sagittal · non-contrast · 0.34mm/px · 3 of 67 slices shown]
[im 23/67  brain]
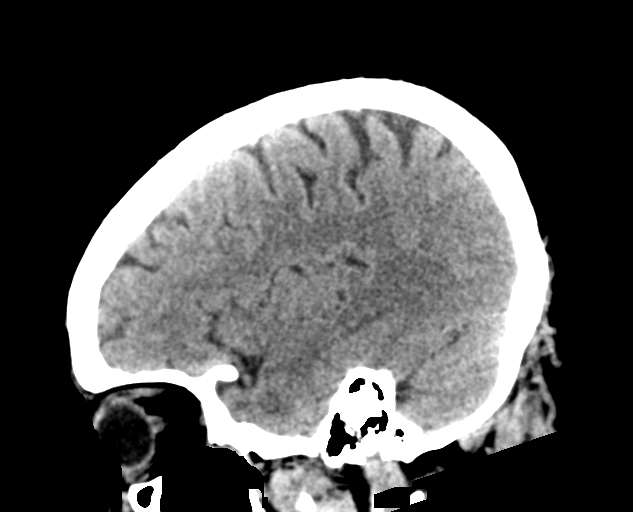
[im 34/67  brain]
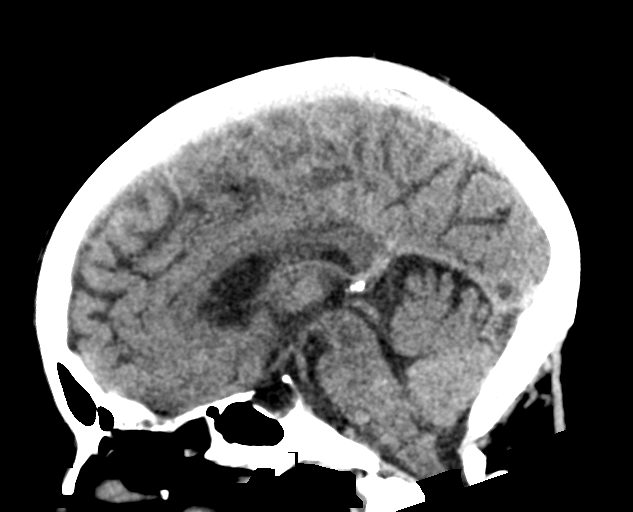
[im 45/67  brain]
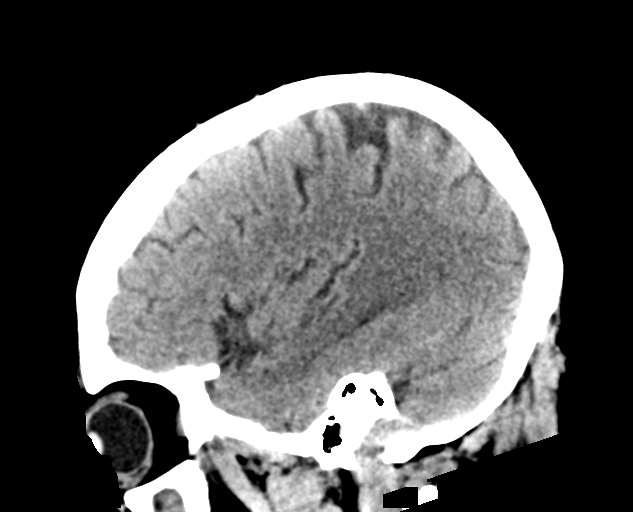

[17 of 47 positions shown; findings below may reference images not displayed]

FINDINGS: Brain: No evidence of acute infarction, hemorrhage, hydrocephalus,
extra-axial collection or mass lesion/mass effect.

Vascular: No hyperdense vessel or unexpected calcification.

Skull: Normal. Negative for fracture or focal lesion.

Sinuses/Orbits: Chronic changes in the RIGHT maxillary sinus.

Other: None.
IMPRESSION: No evidence for acute intracranial abnormality.

## 2020-12-22 MED ORDER — MECLIZINE HCL 12.5 MG PO TABS: 12.5000 mg | ORAL_TABLET | Freq: Two times a day (BID) | ORAL | 0 refills | Status: DC | PRN

## 2020-12-22 MED ORDER — MECLIZINE HCL 25 MG PO TABS: 12.5000 mg | ORAL_TABLET | Freq: Once | ORAL | Status: DC

## 2020-12-22 MED ORDER — MECLIZINE HCL 25 MG PO TABS
25.0000 mg | ORAL_TABLET | Freq: Once | ORAL | Status: AC
  Administered 2020-12-22: 25 mg via ORAL
  Filled 2020-12-22: qty 1

## 2020-12-22 MED ORDER — LACTATED RINGERS IV BOLUS
1000.0000 mL | Freq: Once | INTRAVENOUS | Status: AC
  Administered 2020-12-22: 1000 mL via INTRAVENOUS

## 2020-12-22 NOTE — ED Provider Notes (Signed)
[facility] EMERGENCY DEPARTMENT Provider Note   CSN: [id number] Arrival date & time: 12/22/20  1648     History Chief Complaint  Patient presents with   Dizziness    Bianca Medina is a 71 y.o. female.   Dizziness Quality:  Vertigo Duration:  3 days Timing:  Intermittent Context: head movement   Associated symptoms: no blood in stool, no chest pain, no diarrhea, no headaches, no hearing loss, no nausea, no palpitations, no shortness of breath, no syncope, no tinnitus, no vision changes, no vomiting and no weakness    Patient is a 70 year old female with a past medical history of migraines, HLD, hypertension who is presenting for dizziness.  Patient states that she has felt dizzy for the last 3 days.  On Friday night she felt dizzy and had to lay down to sleep.  She denies any syncopal episode or falling down.  She states she woke up the next morning and was feeling better.  She states that the dizziness is intermittent.  She is also complaining of a headache that has been improving throughout the day.  She denies any weakness or changes in sensation.  She has had no speech changes or unilateral weakness.  Patient denies any fevers, chills, headache, neck stiffness, chest pain, shortness of breath, nausea, vomiting, diarrhea, numbness or weakness.     Past Medical History:  Diagnosis Date   Anemia 2003   IRON DEFICIENCY    Asthma    Common migraine 06/23/2019   Diabetes mellitus without complication (HCC)    GERD (gastroesophageal reflux disease)    Hyperlipidemia, mild    Hypertension    NSVD (normal spontaneous vaginal delivery)    X3   Obesity    PMDD (premenstrual dysphoric disorder)    Sleep disorder     Patient Active Problem List   Diagnosis Date Noted   Healthcare maintenance 12/01/2020   Vitamin D deficiency 12/01/2020   Ear itching 06/27/2019   Common migraine 06/23/2019   History of anemia 03/31/2019   Pre-diabetes 03/31/2019   Viral  syndrome 03/31/2019   Sensorineural hearing loss (SNHL), bilateral 08/12/2017   Tinnitus of both ears 08/12/2017   Morbid obesity ([place]) 12/28/2016   Left breast mass 07/19/2014   Breast tenderness in female 07/19/2014   Menopause 01/09/2014   Vaginal atrophy 01/09/2014   Skin lesion 12/23/2012   Essential hypertension 10/31/2012   Heartburn 10/31/2012    Past Surgical History:  Procedure Laterality Date   ABDOMINAL HYSTERECTOMY     GUM SURGERY  1980's   TUBAL LIGATION       OB History     Gravida  3   Para  3   Term      Preterm      AB      Living  3      SAB      IAB      Ectopic      Multiple      Live Births              Family History  Problem Relation Age of Onset   Cancer Mother        LUNG- SMOKER   Diabetes Father    Hypertension Father    Heart disease Father    Stroke Father    Cancer Paternal Aunt        COLON   Cancer Paternal Uncle        COLON   Breast  cancer Sister     Social History   Tobacco Use   Smoking status: Never   Smokeless tobacco: Never  Vaping Use   Vaping Use: Never used  Substance Use Topics   Alcohol use: No   Drug use: No    Home Medications Prior to Admission medications   Medication Sig Start Date End Date Taking? Authorizing Provider  meclizine (ANTIVERT) 12.5 MG tablet Take 1 tablet (12.5 mg total) by mouth 2 (two) times daily as needed for up to 20 doses for dizziness. 12/22/20  Yes Doretha Sou, MD  Bioflavonoid Products (BIOFLEX PO) Take 1 tablet by mouth daily.    [provider]  Biotin 5000 MCG CAPS Take 5,000 mcg by mouth daily.     [provider]  chlorthalidone (HYGROTON) 25 MG tablet Take 1 tablet (25 mg total) by mouth daily. Patient not taking: Reported on 12/04/2020 11/27/20   Libby Maw, MD  esomeprazole (NEXIUM) 40 MG capsule Take 40 mg by mouth daily.     [provider]  fluticasone (FLONASE) 50 MCG/ACT nasal spray Place 1 spray into both  nostrils daily. 11/14/20   [provider]  linaclotide (LINZESS) 145 MCG CAPS capsule Take 145 mcg by mouth daily before breakfast.    [provider]  losartan (COZAAR) 25 MG tablet Take 1 tablet (25 mg total) by mouth daily. 11/27/20   Libby Maw, MD  metFORMIN (GLUCOPHAGE XR) 500 MG 24 hr tablet Take 1 tablet (500 mg total) by mouth daily with breakfast. 12/01/20   Libby Maw, MD  mometasone-formoterol [facility]) 100-5 MCG/ACT AERO Inhale 2 puffs into the lungs 2 (two) times daily.    [provider]  montelukast (SINGULAIR) 10 MG tablet Take 10 mg by mouth daily. 11/14/20   [provider]  PROAIR HFA 108 (90 Base) MCG/ACT inhaler Inhale 1-2 puffs into the lungs every 4 (four) hours as needed for wheezing or shortness of breath.  03/16/18   [provider]  SYMBICORT 80-4.5 MCG/ACT inhaler Inhale into the lungs. 11/16/20   [provider]  topiramate (TOPAMAX) 25 MG tablet Take one tablet at night for one week, then take 2 tablets at night for one week, then take 3 tablets at night. 11/19/20   Kathrynn Ducking, MD  Vitamin D, Ergocalciferol, (DRISDOL) 1.25 MG (50000 UNIT) CAPS capsule Take 1 capsule (50,000 Units total) by mouth every 7 (seven) days. Patient not taking: Reported on 12/04/2020 12/01/20   Libby Maw, MD    Allergies    Codeine  Review of Systems   Review of Systems  Constitutional:  Negative for chills, diaphoresis, fatigue and fever.  HENT:  Negative for congestion, dental problem, ear discharge, ear pain, facial swelling, hearing loss, nosebleeds, postnasal drip, rhinorrhea, sinus pain, sneezing, sore throat, tinnitus and trouble swallowing.   Eyes:  Negative for pain and visual disturbance.  Respiratory:  Negative for cough, chest tightness, shortness of breath, wheezing and stridor.   Cardiovascular:  Negative for chest pain, palpitations, leg swelling and syncope.  Gastrointestinal:   Negative for abdominal distention, abdominal pain, blood in stool, constipation, diarrhea, nausea and vomiting.  Endocrine: Negative for polydipsia and polyuria.  Genitourinary:  Negative for difficulty urinating, dysuria, flank pain, frequency, hematuria, urgency, vaginal bleeding and vaginal discharge.  Musculoskeletal:  Negative for myalgias, neck pain and neck stiffness.  Skin:  Negative for rash and wound.  Allergic/Immunologic: Negative for environmental allergies and food allergies.  Neurological:  Positive for  dizziness. Negative for seizures, syncope, facial asymmetry, speech difficulty, weakness, light-headedness, numbness and headaches.  Psychiatric/Behavioral:  Negative for agitation, behavioral problems and confusion.    Physical Exam Updated Vital Signs BP (!) 150/69 (BP Location: Left Arm)   Pulse (!) 50   Temp 98.2 F (36.8 C) (Oral)   Resp 13   SpO2 96%   Physical Exam Vitals and nursing note reviewed.  Constitutional:      General: She is not in acute distress.    Appearance: Normal appearance. She is normal weight. She is not ill-appearing.  HENT:     Head: Normocephalic and atraumatic.     Right Ear: External ear normal.     Left Ear: External ear normal.     Nose: Nose normal. No congestion.     Mouth/Throat:     Mouth: Mucous membranes are moist.     Pharynx: Oropharynx is clear. No oropharyngeal exudate or posterior oropharyngeal erythema.  Eyes:     General: No visual field deficit.    Extraocular Movements: Extraocular movements intact.     Conjunctiva/sclera: Conjunctivae normal.     Pupils: Pupils are equal, round, and reactive to light.  Neck:     Vascular: No carotid bruit.  Cardiovascular:     Rate and Rhythm: Normal rate and regular rhythm.     Pulses: Normal pulses.     Heart sounds: Normal heart sounds. No murmur heard. Pulmonary:     Effort: Pulmonary effort is normal. No respiratory distress.     Breath sounds: Normal breath sounds. No  stridor. No wheezing, rhonchi or rales.  Chest:     Chest wall: No tenderness.  Abdominal:     General: Bowel sounds are normal. There is no distension.     Palpations: Abdomen is soft.     Tenderness: There is no abdominal tenderness. There is no right CVA tenderness, left CVA tenderness, guarding or rebound.  Musculoskeletal:        General: No swelling or tenderness. Normal range of motion.     Cervical back: Normal range of motion and neck supple. No rigidity, tenderness or bony tenderness.     Thoracic back: Normal. No tenderness or bony tenderness.     Lumbar back: Normal. No tenderness or bony tenderness.     Right lower leg: No edema.     Left lower leg: No edema.  Skin:    General: Skin is warm and dry.     Coloration: Skin is not jaundiced.  Neurological:     General: No focal deficit present.     Mental Status: She is alert and oriented to person, place, and time. Mental status is at baseline.     Cranial Nerves: Cranial nerves are intact. No cranial nerve deficit, dysarthria or facial asymmetry.     Sensory: Sensation is intact. No sensory deficit.     Motor: Motor function is intact. No weakness.     Coordination: Coordination is intact. Finger-Nose-Finger Test normal.     Gait: Gait is intact. Gait normal.  Psychiatric:        Mood and Affect: Mood normal.        Behavior: Behavior normal.        Thought Content: Thought content normal.        Judgment: Judgment normal.    ED Results / Procedures / Treatments   Labs (all labs ordered are listed, but only abnormal results are displayed) Labs Reviewed  CBC WITH DIFFERENTIAL/PLATELET - Abnormal; Notable  for the following components:      Result Value   WBC 11.6 (*)    MCV 75.1 (*)    MCH 25.3 (*)    Neutro Abs 8.0 (*)    All other components within normal limits  BASIC METABOLIC PANEL - Abnormal; Notable for the following components:   Potassium 3.4 (*)    Glucose, Bld 137 (*)    Creatinine, Ser 1.03 (*)     Calcium 8.8 (*)    GFR, Estimated 59 (*)    All other components within normal limits    EKG EKG Interpretation  Date/Time:  Sunday December 22 2020 17:01:50 EDT Ventricular Rate:  70 PR Interval:  160 QRS Duration: 84 QT Interval:  420 QTC Calculation: 453 R Axis:   -2 Text Interpretation: Normal sinus rhythm with sinus arrhythmia Anterolateral infarct , age undetermined Abnormal ECG No significant change since last tracing Confirmed by Wandra Arthurs [phone]) on 12/22/2020 10:11:41 PM  Radiology CT HEAD WO CONTRAST (5MM)  Result Date: 12/22/2020 CLINICAL DATA:  Nonspecific dizziness. EXAM: CT HEAD WITHOUT CONTRAST TECHNIQUE: Contiguous axial images were obtained from the base of the skull through the vertex without intravenous contrast. COMPARISON:  10/19/2020 FINDINGS: Brain: No evidence of acute infarction, hemorrhage, hydrocephalus, extra-axial collection or mass lesion/mass effect. Vascular: No hyperdense vessel or unexpected calcification. Skull: Normal. Negative for fracture or focal lesion. Sinuses/Orbits: Chronic changes in the RIGHT maxillary sinus. Other: None. IMPRESSION: No evidence for acute intracranial abnormality. Electronically Signed   By: Nolon Nations M.D.   On: 12/22/2020 18:22    Procedures Procedures   Medications Ordered in ED Medications  lactated ringers bolus 1,000 mL (0 mLs Intravenous Stopped 12/22/20 2321)  meclizine (ANTIVERT) tablet 25 mg (25 mg Oral Given 12/22/20 2239)    ED Course  I have reviewed the triage vital signs and the nursing notes.  Pertinent labs & imaging results that were available during my care of the patient were reviewed by me and considered in my medical decision making (see chart for details).    MDM Rules/Calculators/A&P                         Bianca Medina is a 70 y.o. female that is presenting for intermittent dizziness over the last three days. She is hemodynamically stable and in no acute distress. Her physical exam is  notable for no nystagmus. She is able to ambulate without complications. Her neurological exam was notable for no focal neuro deficits. No cerebellar findings. No changes in motor or sensation. Her labs are unremarkable. CT head showed no acute intracranial abnormality. Her EKG showed no acute signs of ischemia. She was given IV fluids and meclizine with relief of symptoms. Patient states that she is back at her baseline. She was given a prescription for meclizine as needed and will follow up with PCP and neurologist.   Patient states compliance and understanding of the plan. I explained labs and imaging to the patient. No further questions at this time from the patient.   The patient is safe and stable for discharge at this time with return precautions provided and a plan for follow-up care in place as needed.  The plan for this patient was discussed with Dr. Darl Householder, who voiced agreement and who oversaw evaluation and treatment of this patient.   Final Clinical Impression(s) / ED Diagnoses Final diagnoses:  Dizziness    Rx / DC Orders ED Discharge Orders  Ordered    meclizine (ANTIVERT) 12.5 MG tablet  2 times daily PRN        12/22/20 2316             Doretha Sou, MD 12/23/20 0203    Drenda Freeze, MD 12/23/20 1520

## 2020-12-22 NOTE — ED Triage Notes (Signed)
Pt c/o dizziness that started on Friday, pt states that she is unsure if she had a syncopal episode or just fell asleep on the couch Friday night. Dizziness has been intermittent since. Pt reports she was also started on Topamax recently for migraines and that it makes her feel "foggy", A&O x 4 at this time.

## 2020-12-22 NOTE — Discharge Instructions (Addendum)
Please follow up with your PCP and neurologist. Take 12.5 mg of meclizine twice a day as need for dizziness.

## 2020-12-22 NOTE — ED Provider Notes (Signed)
Emergency Medicine Provider Triage Evaluation Note  Bianca Medina , a 70 y.o. female  was evaluated in triage.  Pt complains of dizziness that occurred on Friday. Patient states she felt off balance Friday and then possibly had a syncope episode. She notes she sat on the cough and woke up the next morning. Unsure if she passed out or fell asleep. Dizziness resolved; however, she admits to "brain fogginess" since the episode on Friday. Recently started on Topamax for migraines. She has been having a "zapping" sensation in her head throughout the weakness. Admits to worsening right eye floater, has a floater at baseline. No speech changes or unilateral weakness. No previous CVA. No chest pain or shortness of breath.  Review of Systems  Positive: dizziness Negative: CP  Physical Exam  BP 134/68 (BP Location: Left Arm)   Pulse 74   Temp 98.6 F (37 C) (Oral)   Resp 16   SpO2 98%  Gen:   Awake, no distress   Resp:  Normal effort  MSK:   Moves extremities without difficulty  Other:    Medical Decision Making  Medically screening exam initiated at 4:59 PM.  Appropriate orders placed.  Bianca Medina was informed that the remainder of the evaluation will be completed by another provider, this initial triage assessment does not replace that evaluation, and the importance of remaining in the ED until their evaluation is complete.  Labs EKG CT head   Karie Kirks 12/22/20 1702    Regan Lemming, MD 12/22/20 [phone]

## 2020-12-25 ENCOUNTER — Other Ambulatory Visit: Payer: Self-pay

## 2020-12-26 ENCOUNTER — Encounter: Payer: Self-pay | Admitting: Family Medicine

## 2020-12-26 ENCOUNTER — Other Ambulatory Visit: Payer: Self-pay

## 2020-12-26 ENCOUNTER — Ambulatory Visit: Payer: Medicare PPO | Admitting: Family Medicine

## 2020-12-26 VITALS — BP 115/66 | HR 67 | Temp 97.3°F | Ht 64.0 in | Wt 270.4 lb

## 2020-12-26 DIAGNOSIS — R7303 Prediabetes: Secondary | ICD-10-CM | POA: Diagnosis not present

## 2020-12-26 DIAGNOSIS — Z87898 Personal history of other specified conditions: Secondary | ICD-10-CM | POA: Diagnosis not present

## 2020-12-26 DIAGNOSIS — E860 Dehydration: Secondary | ICD-10-CM | POA: Diagnosis not present

## 2020-12-26 DIAGNOSIS — Z09 Encounter for follow-up examination after completed treatment for conditions other than malignant neoplasm: Secondary | ICD-10-CM

## 2020-12-26 DIAGNOSIS — I10 Essential (primary) hypertension: Secondary | ICD-10-CM

## 2020-12-26 NOTE — Progress Notes (Signed)
Established Patient Office Visit  Subjective:  Patient ID: Bianca Medina, female    DOB: November 05, 1950  Age: 70 y.o. MRN: [id number]  CC:  Chief Complaint  Patient presents with   Hospitalization [facility] follow up patient passed out on late Friday night seen at hospital Sunday morning feeling a little dizzy. States that she feels a little better.     HPI [name] presents for hospital discharge follow-up after a syncopal episode at her house.  Just prior to her syncopal episode she felt extremely lightheaded and was able to reach the couch and sit down before she fell out.  She came to immediately.  She denied feeling a spinning sensation or palpitations.  There is no chest pain or shortness of breath.  911 was called and she was evaluated in the emergency room.  Blood pressure was 150/69, EKG was normal, CT of head was normal.  And she responded to IV fluids.  At home she sometimes feels short bursts of palpitations when she lies down at night.  These are asymptomatic with no shortness of breath or chest pain.  Blood pressure was elevated at her last clinic visit.  It is lower today.  She admits that her urine has been dark lately.  She is tolerating the metformin well.  Past Medical History:  Diagnosis Date   Anemia 2003   IRON DEFICIENCY    Asthma    Common migraine 06/23/2019   Diabetes mellitus without complication (HCC)    GERD (gastroesophageal reflux disease)    Hyperlipidemia, mild    Hypertension    NSVD (normal spontaneous vaginal delivery)    X3   Obesity    PMDD (premenstrual dysphoric disorder)    Sleep disorder     Past Surgical History:  Procedure Laterality Date   ABDOMINAL HYSTERECTOMY     GUM SURGERY  1980's   TUBAL LIGATION      Family History  Problem Relation Age of Onset   Cancer Mother        LUNG- SMOKER   Diabetes Father    Hypertension Father    Heart disease Father    Stroke Father    Cancer Paternal Aunt        COLON    Cancer Paternal Uncle        COLON   Breast cancer Sister     Social History   Socioeconomic History   Marital status: Single    Spouse name: Not on file   Number of children: 3   Years of education: college   Highest education level: Not on file  Occupational History   Occupation: Architectural technologist: [employer]  Tobacco Use   Smoking status: Never   Smokeless tobacco: Never  Vaping Use   Vaping Use: Never used  Substance and Sexual Activity   Alcohol use: No   Drug use: No   Sexual activity: Not Currently    Birth control/protection: Surgical    Comment: HYSTERECTOMY-1st intercourse 40 yo-5 partners  Other Topics Concern   Not on file  Social History Narrative   Right handed     Caffeine use: has a drink every other day   Social Determinants of Health   Financial Resource Strain: Not on file  Food Insecurity: Not on file  Transportation Needs: Not on file  Physical Activity: Not on file  Stress: Not on file  Social Connections: Not on file  Intimate Partner Violence: Not on file    Outpatient Medications Prior to Visit  Medication Sig Dispense Refill   Bioflavonoid Products (BIOFLEX PO) Take 1 tablet by mouth daily.     Biotin 5000 MCG CAPS Take 5,000 mcg by mouth daily.      chlorthalidone (HYGROTON) 25 MG tablet Take 1 tablet (25 mg total) by mouth daily. 90 tablet 1   esomeprazole (NEXIUM) 40 MG capsule Take 40 mg by mouth daily.      fluticasone (FLONASE) 50 MCG/ACT nasal spray Place 1 spray into both nostrils daily.     linaclotide (LINZESS) 145 MCG CAPS capsule Take 145 mcg by mouth daily before breakfast.     losartan (COZAAR) 25 MG tablet Take 1 tablet (25 mg total) by mouth daily. 90 tablet 0   meclizine (ANTIVERT) 12.5 MG tablet Take 1 tablet (12.5 mg total) by mouth 2 (two) times daily as needed for up to 20 doses for dizziness. 20 tablet 0   metFORMIN (GLUCOPHAGE XR) 500 MG 24 hr tablet Take 1 tablet (500  mg total) by mouth daily with breakfast. 90 tablet 1   mometasone-formoterol (DULERA) 100-5 MCG/ACT AERO Inhale 2 puffs into the lungs 2 (two) times daily.     montelukast (SINGULAIR) 10 MG tablet Take 10 mg by mouth daily.     PROAIR HFA 108 (90 Base) MCG/ACT inhaler Inhale 1-2 puffs into the lungs every 4 (four) hours as needed for wheezing or shortness of breath.      SYMBICORT 80-4.5 MCG/ACT inhaler Inhale into the lungs.     topiramate (TOPAMAX) 25 MG tablet Take one tablet at night for one week, then take 2 tablets at night for one week, then take 3 tablets at night. 90 tablet 3   Vitamin D, Ergocalciferol, (DRISDOL) 1.25 MG (50000 UNIT) CAPS capsule Take 1 capsule (50,000 Units total) by mouth every 7 (seven) days. 15 capsule 1   No facility-administered medications prior to visit.    Allergies  Allergen Reactions   Codeine Other (See Comments)    nightmares Other reaction(s): Other nightmares nightmares    ROS Review of Systems  Constitutional:  Negative for chills, diaphoresis, fatigue, fever and unexpected weight change.  HENT: Negative.    Eyes:  Negative for photophobia and visual disturbance.  Respiratory:  Negative for chest tightness, shortness of breath and wheezing.   Cardiovascular:  Positive for palpitations. Negative for chest pain and leg swelling.  Gastrointestinal: Negative.   Endocrine: Negative for polyphagia and polyuria.  Genitourinary:  Negative for difficulty urinating, dysuria, frequency and urgency.  Neurological:  Negative for weakness, light-headedness and headaches.  Psychiatric/Behavioral: Negative.       Objective:    Physical Exam Vitals and nursing note reviewed.  Constitutional:      General: She is not in acute distress.    Appearance: Normal appearance. She is not ill-appearing, toxic-appearing or diaphoretic.  HENT:     Head: Normocephalic and atraumatic.     Right Ear: Tympanic membrane, ear canal and external ear normal.     Left  Ear: Tympanic membrane, ear canal and external ear normal.     Mouth/Throat:     Mouth: Mucous membranes are dry.     Pharynx: Oropharynx is clear. No oropharyngeal exudate or posterior oropharyngeal erythema.  Eyes:     General: No scleral icterus.       Right eye: No discharge.        Left eye: No discharge.  Extraocular Movements: Extraocular movements intact.     Conjunctiva/sclera: Conjunctivae normal.     Pupils: Pupils are equal, round, and reactive to light.  Cardiovascular:     Rate and Rhythm: Normal rate and regular rhythm.  Pulmonary:     Effort: Pulmonary effort is normal.     Breath sounds: Normal breath sounds.  Musculoskeletal:     Cervical back: No rigidity or tenderness.  Lymphadenopathy:     Cervical: No cervical adenopathy.  Skin:    General: Skin is warm and dry.  Neurological:     Mental Status: She is alert and oriented to person, place, and time.  Psychiatric:        Mood and Affect: Mood normal.        Behavior: Behavior normal.    BP 115/66 (BP Location: Right Arm, Patient Position: Sitting, Cuff Size: Large)   Pulse 67   Temp (!) 97.3 F (36.3 C) (Temporal)   Ht '5\' 4"'$  (1.626 m)   Wt 270 lb 6.4 oz (122.7 kg)   SpO2 95%   BMI 46.41 kg/m  Wt Readings from Last 3 Encounters:  12/26/20 270 lb 6.4 oz (122.7 kg)  12/04/20 267 lb 12.8 oz (121.5 kg)  11/27/20 272 lb 6.4 oz (123.6 kg)     Health Maintenance Due  Topic Date Due   Zoster Vaccines- Shingrix (1 of 2) Never done   PNA vac Low Risk Adult (2 of 2 - PPSV23) 04/22/2017   INFLUENZA VACCINE  12/16/2020    There are no preventive care reminders to display for this patient.  Lab Results  Component Value Date   TSH 0.79 04/22/2012   Lab Results  Component Value Date   WBC 11.6 (H) 12/22/2020   HGB 12.9 12/22/2020   HCT 38.3 12/22/2020   MCV 75.1 (L) 12/22/2020   PLT 270 12/22/2020   Lab Results  Component Value Date   NA 139 12/22/2020   K 3.4 (L) 12/22/2020   CO2 29  12/22/2020   GLUCOSE 137 (H) 12/22/2020   BUN 22 12/22/2020   CREATININE 1.03 (H) 12/22/2020   BILITOT 0.4 11/27/2020   ALKPHOS 101 11/27/2020   AST 17 11/27/2020   ALT 22 11/27/2020   PROT 6.7 11/27/2020   ALBUMIN 3.8 11/27/2020   CALCIUM 8.8 (L) 12/22/2020   ANIONGAP 7 12/22/2020   GFR 83.03 11/27/2020   Lab Results  Component Value Date   CHOL 171 11/27/2020   Lab Results  Component Value Date   HDL 50.30 11/27/2020   Lab Results  Component Value Date   LDLCALC 109 (H) 11/27/2020   Lab Results  Component Value Date   TRIG 56.0 11/27/2020   Lab Results  Component Value Date   CHOLHDL 3 11/27/2020   Lab Results  Component Value Date   HGBA1C 6.8 (H) 11/27/2020      Assessment & Plan:   Problem List Items Addressed This Visit       Cardiovascular and Mediastinum   Essential hypertension - Primary     Other   Pre-diabetes   Dehydration   History of syncope   Hospital discharge follow-up    No orders of the defined types were placed in this encounter. Given information on dehydration.  She she will work on keeping her self hydrated.  We will monitor her palpitations. Unable to supply a urine sample today.  Continue all current medicines for now.  Follow-up: Return in about 1 month (around 01/26/2021), or if symptoms worsen  or fail to improve.    Libby Maw, MD

## 2021-01-09 DIAGNOSIS — M533 Sacrococcygeal disorders, not elsewhere classified: Secondary | ICD-10-CM | POA: Insufficient documentation

## 2021-01-09 DIAGNOSIS — K6289 Other specified diseases of anus and rectum: Secondary | ICD-10-CM | POA: Insufficient documentation

## 2021-01-13 ENCOUNTER — Telehealth: Payer: Self-pay | Admitting: Family Medicine

## 2021-01-13 NOTE — Telephone Encounter (Signed)
Left message for patient to call back and schedule Medicare Annual Wellness Visit (AWV). Please offer to do virtually or by telephone.  Left office number and my jabber #[phone]. ? ?Due for AWVI ? ?Please schedule at anytime with Nurse Health Advisor. ?  ?

## 2021-01-14 ENCOUNTER — Ambulatory Visit: Payer: Medicare PPO | Admitting: Physician Assistant

## 2021-01-24 ENCOUNTER — Ambulatory Visit (INDEPENDENT_AMBULATORY_CARE_PROVIDER_SITE_OTHER): Payer: Medicare PPO

## 2021-01-24 DIAGNOSIS — Z23 Encounter for immunization: Secondary | ICD-10-CM | POA: Diagnosis not present

## 2021-01-24 DIAGNOSIS — Z0000 Encounter for general adult medical examination without abnormal findings: Secondary | ICD-10-CM | POA: Diagnosis not present

## 2021-01-24 NOTE — Progress Notes (Signed)
Subjective:   Bianca Medina is a 70 y.o. female who presents for Medicare Annual (Subsequent) preventive examination.      I connected with  SHONDRIA GARDUNO on 01/24/21 by a audio only  telemedicine application and verified that I am speaking with the correct person using two identifiers. Full name and DOB.  Location of patient: home Location of provider: office Persons participating in visit:  Kyarah Diercks and Armandina Gemma, CMA   I discussed the limitations of evaluation and management by telemedicine. The patient expressed understanding and agreed to proceed.   Review of Systems    Deferred to PCP       Objective:    Today's Vitals   01/24/21 1104  PainSc: 5    There is no height or weight on file to calculate BMI.  Advanced Directives 01/24/2021 11/20/2019 03/22/2019 07/17/2018 07/03/2018 01/10/2018 12/26/2016  Does Patient Have a Medical Advance Directive? Yes No No Yes Yes Yes Yes  Type of Advance Directive Living will;Healthcare Power of [place];Living will - [place];Living will  Does patient want to make changes to medical advance directive? No - Patient declined - - - No - Patient declined Yes (MAU/Ambulatory/Procedural Areas - Information given) -  Copy of [place] in Chart? No - copy requested - - - - - No - copy requested  Would patient like information on creating a medical advance directive? - No - Patient declined No - Patient declined - - - -    Current Medications (verified) Outpatient Encounter Medications as of 01/24/2021  Medication Sig   Bioflavonoid Products (BIOFLEX PO) Take 1 tablet by mouth daily.   Biotin 5000 MCG CAPS Take 5,000 mcg by mouth daily.    esomeprazole (NEXIUM) 40 MG capsule Take 40 mg by mouth daily.    fluticasone (FLONASE) 50 MCG/ACT nasal spray Place 1 spray into both nostrils daily.   ibuprofen (ADVIL) 600 MG tablet    Loratadine 10 MG  CHEW    losartan (COZAAR) 25 MG tablet Take 1 tablet (25 mg total) by mouth daily.   meclizine (ANTIVERT) 12.5 MG tablet Take 1 tablet (12.5 mg total) by mouth 2 (two) times daily as needed for up to 20 doses for dizziness.   metFORMIN (GLUCOPHAGE XR) 500 MG 24 hr tablet Take 1 tablet (500 mg total) by mouth daily with breakfast.   mometasone-formoterol (DULERA) 100-5 MCG/ACT AERO Inhale 2 puffs into the lungs 2 (two) times daily.   PROAIR HFA 108 (90 Base) MCG/ACT inhaler Inhale 1-2 puffs into the lungs every 4 (four) hours as needed for wheezing or shortness of breath.    SYMBICORT 80-4.5 MCG/ACT inhaler Inhale into the lungs.   Vitamin D, Ergocalciferol, (DRISDOL) 1.25 MG (50000 UNIT) CAPS capsule Take 1 capsule (50,000 Units total) by mouth every 7 (seven) days.   chlorthalidone (HYGROTON) 25 MG tablet Take 1 tablet (25 mg total) by mouth daily. (Patient not taking: Reported on 01/24/2021)   linaclotide (LINZESS) 145 MCG CAPS capsule Take 145 mcg by mouth daily before breakfast. (Patient not taking: Reported on 01/24/2021)   montelukast (SINGULAIR) 10 MG tablet Take 10 mg by mouth daily. (Patient not taking: Reported on 01/24/2021)   topiramate (TOPAMAX) 25 MG tablet Take one tablet at night for one week, then take 2 tablets at night for one week, then take 3 tablets at night. (Patient not taking: Reported on 01/24/2021)   No  facility-administered encounter medications on file as of 01/24/2021.    Allergies (verified) Codeine   History: Past Medical History:  Diagnosis Date   Anemia 2003   IRON DEFICIENCY    Asthma    Common migraine 06/23/2019   Diabetes mellitus without complication (HCC)    GERD (gastroesophageal reflux disease)    Hyperlipidemia, mild    Hypertension    NSVD (normal spontaneous vaginal delivery)    X3   Obesity    PMDD (premenstrual dysphoric disorder)    Sleep disorder    Past Surgical History:  Procedure Laterality Date   ABDOMINAL HYSTERECTOMY     GUM SURGERY   1980's   TUBAL LIGATION     Family History  Problem Relation Age of Onset   Cancer Mother        LUNG- SMOKER   Diabetes Father    Hypertension Father    Heart disease Father    Stroke Father    Cancer Paternal Aunt        COLON   Cancer Paternal Uncle        COLON   Breast cancer Sister    Social History   Socioeconomic History   Marital status: Single    Spouse name: Not on file   Number of children: 3   Years of education: college   Highest education level: Not on file  Occupational History   Occupation: Architectural technologist: [employer]  Tobacco Use   Smoking status: Never   Smokeless tobacco: Never  Vaping Use   Vaping Use: Never used  Substance and Sexual Activity   Alcohol use: No   Drug use: No   Sexual activity: Not Currently    Birth control/protection: Surgical    Comment: HYSTERECTOMY-1st intercourse 61 yo-5 partners  Other Topics Concern   Not on file  Social History Narrative   Right handed     Caffeine use: has a drink every other day   Social Determinants of Health   Financial Resource Strain: Not on file  Food Insecurity: Not on file  Transportation Needs: Not on file  Physical Activity: Not on file  Stress: Not on file  Social Connections: Not on file    Tobacco Counseling Counseling given: Not Answered   Clinical Intake:  Pre-visit preparation completed: Yes  Pain : 0-10 Pain Score: 5  Pain Type: Chronic pain (1.5  year) Pain Location: Breast (left & Right) Pain Orientation: Right, Left Pain Descriptors / Indicators: Sore, Aching Pain Onset: More than a month ago Pain Frequency: Intermittent Pain Relieving Factors: no meds taken Effect of Pain on Daily Activities: no  Pain Relieving Factors: no meds taken  Nutritional Risks: None Diabetes: Yes  How often do you need to have someone help you when you read instructions, pamphlets, or other written materials from your doctor or  pharmacy?: 1 - Never  Diabetic?NO  Interpreter Needed?: No      Activities of Daily Living In your present state of health, do you have any difficulty performing the following activities: 01/24/2021  Hearing? N  Vision? N  Difficulty concentrating or making decisions? N  Walking or climbing stairs? N  Dressing or bathing? N  Doing errands, shopping? N  Preparing Food and eating ? N  Using the Toilet? N  In the past six months, have you accidently leaked urine? N  Do you have problems with loss of bowel control? N  Managing your Medications? N  Managing  your Finances? N  Housekeeping or managing your Housekeeping? N  Some recent data might be hidden    Patient Care Team: Libby Maw, MD as PCP - General (Family Medicine)  Indicate any recent Medical Services you may have received from other than [facility] providers in the past year (date may be approximate).     Assessment:   This is a routine wellness examination for [place].  Hearing/Vision screen No results found.  Dietary issues and exercise activities discussed: Current Exercise Habits: The patient does not participate in regular exercise at present, Exercise limited by: Other - see comments (vertiago)   Goals Addressed   None    Depression Screen PHQ 2/9 Scores 01/24/2021 11/27/2020 11/27/2020 11/20/2019 01/10/2018 12/26/2016  PHQ - 2 Score 0 0 0 0 0 0  PHQ- 9 Score 1 7 - 4 - -    Fall Risk Fall Risk  01/24/2021 11/27/2020 01/10/2018 12/26/2016  Falls in the past year? 0 0 No No  Number falls in past yr: 0 - - -  Injury with Fall? 0 - - -    FALL RISK PREVENTION PERTAINING TO THE HOME:  Any stairs in or around the home? No  If so, are there any without handrails? No  Home free of loose throw rugs in walkways, pet beds, electrical cords, etc? Yes  Adequate lighting in your home to reduce risk of falls? Yes   ASSISTIVE DEVICES UTILIZED TO PREVENT FALLS:  Life alert? No  Use of a cane, walker or w/c? No   Grab bars in the bathroom? No  Shower chair or bench in shower? No  Elevated toilet seat or a handicapped toilet? No   TIMED UP AND GO:  Was the test performed? N/A Length of time to ambulate 10 feet: N/A    Cognitive Function:     6CIT Screen 01/24/2021  What Year? 0 points  What month? 0 points  What time? 0 points  Count back from 20 0 points  Months in reverse 0 points  Repeat phrase 0 points  Total Score 0    Immunizations Immunization History  Administered Date(s) Administered   Influenza Inj Mdck Quad Pf 04/22/2018   Influenza Whole 04/25/2012   Influenza,inj,Quad PF,6+ Mos 02/20/2019   Moderna Sars-Covid-2 Vaccination 10/07/2019, 10/31/2019, 02/13/2020   Pneumococcal Conjugate-13 04/25/2012   Tdap 02/18/2012    TDAP status: Up to date  Flu Vaccine status: Due, Education has been provided regarding the importance of this vaccine. Advised may receive this vaccine at local pharmacy or Health Dept. Aware to provide a copy of the vaccination record if obtained from local pharmacy or Health Dept. Verbalized acceptance and understanding.  Pneumococcal vaccine status: Up to date  Covid-19 vaccine status: Completed vaccines  Qualifies for Shingles Vaccine? No  , aged out Zostavax completed No  , aged out   Screening Tests Health Maintenance  Topic Date Due   Zoster Vaccines- Shingrix (1 of 2) Never done   PNA vac Low Risk Adult (2 of 2 - PPSV23) 04/22/2017   INFLUENZA VACCINE  12/16/2020   TETANUS/TDAP  02/17/2022   MAMMOGRAM  09/25/2022   COLONOSCOPY (Pts 45-35yr Insurance coverage will need to be confirmed)  08/06/2025   DEXA SCAN  Completed   Hepatitis C Screening  Completed   HPV VACCINES  Aged Out   COVID-19 Vaccine  Discontinued    Health Maintenance  Health Maintenance Due  Topic Date Due   Zoster Vaccines- Shingrix (1 of 2) Never done  PNA vac Low Risk Adult (2 of 2 - PPSV23) 04/22/2017   INFLUENZA VACCINE  12/16/2020    Colorectal  cancer screening: No longer required.   Mammogram: no longer needed due to aged out.  Bone Density: completed 2017 by OB-GYN  Lung Cancer Screening: (Low Dose CT Chest recommended if Age 17-80 years, 30 pack-year currently smoking OR have quit w/in 15years.) does not qualify.   Lung Cancer Screening Referral: N/A  Additional Screening:  Hepatitis C Screening: does not qualify; Completed: N/A  Vision Screening: Recommended annual ophthalmology exams for early detection of glaucoma and other disorders of the eye. Is the patient up to date with their annual eye exam?  Yes  Who is the provider or what is the name of the office in which the patient attends annual eye exams? Dr Herschel [place] If pt is not established with a provider, would they like to be referred to a provider to establish care? No .   Dental Screening: Recommended annual dental exams for proper oral hygiene  Community Resource Referral / Chronic Care Management: CRR required this visit?  No   CCM required this visit?  No      Plan:     I have personally reviewed and noted the following in the patient's chart:   Medical and social history Use of alcohol, tobacco or illicit drugs  Current medications and supplements including opioid prescriptions.  Functional ability and status Nutritional status Physical activity Advanced directives List of other physicians Hospitalizations, surgeries, and ER visits in previous 12 months Vitals Screenings to include cognitive, depression, and falls Referrals and appointments  In addition, I have reviewed and discussed with patient certain preventive protocols, quality metrics, and best practice recommendations. A written personalized care plan for preventive services as well as general preventive health recommendations were provided to patient.     Konrad Saha, [place]   01/24/2021   Nurse Notes:  none face to face  69  Min encounter

## 2021-01-27 ENCOUNTER — Ambulatory Visit: Payer: Medicare PPO | Admitting: Neurology

## 2021-01-27 VITALS — BP 146/74 | HR 72 | Ht 64.0 in | Wt 275.0 lb

## 2021-01-27 DIAGNOSIS — R0683 Snoring: Secondary | ICD-10-CM

## 2021-01-27 DIAGNOSIS — R0689 Other abnormalities of breathing: Secondary | ICD-10-CM | POA: Diagnosis not present

## 2021-01-27 DIAGNOSIS — F409 Phobic anxiety disorder, unspecified: Secondary | ICD-10-CM

## 2021-01-27 DIAGNOSIS — Z6841 Body Mass Index (BMI) 40.0 and over, adult: Secondary | ICD-10-CM

## 2021-01-27 DIAGNOSIS — F5105 Insomnia due to other mental disorder: Secondary | ICD-10-CM

## 2021-01-27 DIAGNOSIS — G4763 Sleep related bruxism: Secondary | ICD-10-CM | POA: Diagnosis not present

## 2021-01-27 DIAGNOSIS — Z862 Personal history of diseases of the blood and blood-forming organs and certain disorders involving the immune mechanism: Secondary | ICD-10-CM

## 2021-01-27 DIAGNOSIS — E66813 Obesity, class 3: Secondary | ICD-10-CM

## 2021-01-27 NOTE — Progress Notes (Addendum)
SLEEP MEDICINE CLINIC    Provider:  Larey Seat, MD  Primary Care Physician:  Bianca Maw, MD [place] [address]     Referring Provider: Westley Medina, Bianca Medina, [place] Bianca Medina,  Bianca Medina          Chief Complaint according to patient   Patient presents with:     New Patient (Initial Visit)     Sleep consult from Dr Bianca Medina. RELENA Medina is a 70 y.o. female with PMH of asthma, DM, hypertension, hyperlipidemia presents to urgent care today with complaints of leg swelling and feeling short of breath.  Patient states she is actually feeling much better today but wanted to be evaluated.  Patient describes dyspnea on exertion x1 week.  She was able to walk country park yesterday and only became out of breath on the hills.  Bilateral lower extremity swelling noted over the past 5 to 6 days now somewhat improved without intervention.  She does not monitor her weight.  Patient denies any recent fever or chills, headache, chest pain, orthopnea.  Patient does state she stopped taking some of her medications in December as she felt walking with helping her feel better and she was on too many medications.  She has not been able to walk as much due to cold weather.  Patient was also seen by her cardiologist last week who recommended possible echocardiogram if swelling became worse      HISTORY OF PRESENT ILLNESS:  Bianca Medina is a 70 y.o. AA female patient and seen here as a referral on 01/27/2021 from Dr Bianca Medina for a sleep consultation , in relation to Headache .  Chief concern according to patient :  ' only sometimes do I wake with a headache, not severe, and once I take tylenol its goes away immediately. There is no nausea, no photophobia and no focal point that hurts. "   I have the pleasure of seeing Bianca Medina today, a right -handed Black or [place] American female with a possible sleep disorder.  She   has a past  medical history of Anemia (2003), Asthma, Common migraine (06/23/2019), Diabetes mellitus without complication (Bianca Medina), GERD (gastroesophageal reflux disease), Hyperlipidemia, mild, Hypertension, NSVD (normal spontaneous vaginal delivery), PMDD (premenstrual dysphoric disorder), morbid obesity. Vertigo recently, tinnitus- on meclizine.  Bianca Medina had undergone a sleep study on December 12, 2013 performed at [facility], under the guidance of Dr. Baird Medina.  She had a really poor sleep efficiency the total recording time was 412 minutes and the total sleep time 174 minutes so this was 42.3% indicating insomnia.  For the time that he actually slept she had an AHI of 11/h these were all hypopneas no frank apneas were seen.  She was described as snoring.  All hypopneas clustered in rem sleep.  This is most likely a manifestation of obesity hypoventilation and her lowest oxygen saturation was 80% SPO2 she spent 1.8 minutes with a oxygen saturation less than 88%.  She remained on room air at the course her apnea was so mild she was not split into a CPAP titration at night.  Her neurologic exam was entirely normal.  She carried a diagnosis of allergic rhinitis insulin resistance, perimenstrual mood disorder and morbid obesity.    Today's Epworth sleepiness score was endorsed at only 5 points out of 24 points in stark contrast to her Epworth score at the  time of her sleep study from 7 years ago.  Then she was excessively daytime sleepy but she also attributed this to stress at her job.  She has since retired. Her blood pressure has been better controlled, but was still need to medicate.  PHQ indicated still depression to be present .       Sleep relevant medical history: Nocturia 2 times,  Tonsillectomy-none ,  wisdom teeth removed.  Wore a brace and retainers, mouth guard , bruxism.  Gasping for air. GERD    Family medical /sleep history: no  other family member with bruxism, but sister with  insomnia, nobody on CPAP with OSA, insomnia.    Social history:  Patient is retired from office job, Engineer, maintenance (IT)-  and lives in a household alone. Family status is single , Pets are not  present.Tobacco use;never .  ETOH use ; none , Caffeine intake in form of Coffee( /) Soda( 1-2 a week) or Tea ( 1-2 / week) or energy drinks. Regular exercise in form of walking , non -swimmer.   Hobbies :arts and crafts.       Sleep habits are as follows: The patient's dinner time is between 5-6 PM. The patient goes to bed at 11-12 PM and continues to sleep for intervals of 3 hours, wakes for 1-2  bathroom breaks, the first time at 2 AM.   The preferred sleep position is supine or side , with the support of one thick  pillows.  Dreams are reportedly infrequent.  Variable   AM is the usual rise time. The patient wakes up spontaneously. She reports not feeling refreshed or restored in AM, with symptoms such as dry mouth, uncommon morning headaches, and residual fatigue. Naps are taken frequently, lasting from 1-1.5  hours  and are refreshing .    Review of Systems: Out of a complete 14 system review, the patient complains of only the following symptoms, and all other reviewed systems are negative.:  Fatigue, sleepiness , snoring, fragmented sleep, Insomnia is a frequent occurrence , 4-5 hours of sleep only.  Waking up and not going back to sleep.    How likely are you to doze in the following situations: 0 = not likely, 1 = slight chance, 2 = moderate chance, 3 = high chance   Sitting and Reading? Watching Television? Sitting inactive in a public place (theater or meeting)? As a passenger in a car for an hour without a break? Lying down in the afternoon when circumstances permit? Sitting and talking to someone? Sitting quietly after lunch without alcohol? In a car, while stopped for a few minutes in traffic?   Total = 5/ 24 points   FSS endorsed at 14/ 63 points.   PHQ 9 of 4 points.   Social History    Socioeconomic History   Marital status: Single    Spouse name: Not on file   Number of children: 3   Years of education: college   Highest education level: Not on file  Occupational History   Occupation: Tourist information centre manager [employer]    Employer: [employer]  Tobacco Use   Smoking status: Never   Smokeless tobacco: Never  Vaping Use   Vaping Use: Never used  Substance and Sexual Activity   Alcohol use: No   Drug use: No   Sexual activity: Not Currently    Birth control/protection: Surgical    Comment: HYSTERECTOMY-1st intercourse 49 yo-5 partners  Other Topics Concern   Not on file  Social  History Narrative   Right handed     Caffeine use: has a drink every other day   Social Determinants of Health   Financial Resource Strain: Not on file  Food Insecurity: Not on file  Transportation Needs: Not on file  Physical Activity: Not on file  Stress: Not on file  Social Connections: Not on file    Family History  Problem Relation Age of Onset   Cancer Mother        LUNG- SMOKER   Diabetes Father    Hypertension Father    Heart disease Father    Stroke Father    Cancer Paternal Aunt        COLON   Cancer Paternal Uncle        COLON   Breast cancer Sister     Past Medical History:  Diagnosis Date   Anemia 2003   IRON DEFICIENCY    Asthma    Common migraine 06/23/2019   Diabetes mellitus without complication (HCC)    GERD (gastroesophageal reflux disease)    Hyperlipidemia, mild    Hypertension    NSVD (normal spontaneous vaginal delivery)    X3   Obesity    PMDD (premenstrual dysphoric disorder)    Sleep disorder     Past Surgical History:  Procedure Laterality Date   ABDOMINAL HYSTERECTOMY     GUM SURGERY  1980's   TUBAL LIGATION       Current Outpatient Medications on File Prior to Visit  Medication Sig Dispense Refill   Bioflavonoid Products (BIOFLEX PO) Take 1 tablet by mouth daily.     Biotin 5000 MCG CAPS Take 5,000 mcg  by mouth daily.      esomeprazole (NEXIUM) 40 MG capsule Take 40 mg by mouth daily.      fluticasone (FLONASE) 50 MCG/ACT nasal spray Place 1 spray into both nostrils daily.     ibuprofen (ADVIL) 600 MG tablet      linaclotide (LINZESS) 145 MCG CAPS capsule Take 145 mcg by mouth daily before breakfast.     Loratadine 10 MG CHEW      losartan (COZAAR) 25 MG tablet Take 1 tablet (25 mg total) by mouth daily. 90 tablet 0   meclizine (ANTIVERT) 12.5 MG tablet Take 1 tablet (12.5 mg total) by mouth 2 (two) times daily as needed for up to 20 doses for dizziness. 20 tablet 0   metFORMIN (GLUCOPHAGE XR) 500 MG 24 hr tablet Take 1 tablet (500 mg total) by mouth daily with breakfast. 90 tablet 1   mometasone-formoterol (DULERA) 100-5 MCG/ACT AERO Inhale 2 puffs into the lungs 2 (two) times daily.     montelukast (SINGULAIR) 10 MG tablet Take 10 mg by mouth daily.     PROAIR HFA 108 (90 Base) MCG/ACT inhaler Inhale 1-2 puffs into the lungs every 4 (four) hours as needed for wheezing or shortness of breath.      SYMBICORT 80-4.5 MCG/ACT inhaler Inhale into the lungs.     Vitamin D, Ergocalciferol, (DRISDOL) 1.25 MG (50000 UNIT) CAPS capsule Take 1 capsule (50,000 Units total) by mouth every 7 (seven) days. 15 capsule 1   chlorthalidone (HYGROTON) 25 MG tablet Take 1 tablet (25 mg total) by mouth daily. (Patient not taking: Reported on 01/27/2021) 90 tablet 1   topiramate (TOPAMAX) 25 MG tablet Take one tablet at night for one week, then take 2 tablets at night for one week, then take 3 tablets at night. (Patient not taking: No sig reported) 90  tablet 3   No current facility-administered medications on file prior to visit.    Allergies  Allergen Reactions   Codeine Other (See Comments)    nightmares Other reaction(s): Other nightmares nightmares    Physical exam:  Today's Vitals   01/27/21 1003  BP: (!) 146/74  Pulse: 72  Weight: 275 lb (124.7 kg)  Height: '5\' 4"'$  (1.626 m)   Body mass index is  47.2 kg/m.   Wt Readings from Last 3 Encounters:  01/27/21 275 lb (124.7 kg)  12/26/20 270 lb 6.4 oz (122.7 kg)  12/04/20 267 lb 12.8 oz (121.5 kg)     Ht Readings from Last 3 Encounters:  01/27/21 '5\' 4"'$  (1.626 m)  12/26/20 '5\' 4"'$  (1.626 m)  12/04/20 '5\' 4"'$  (1.626 m)      General: The patient is awake, alert and appears not in acute distress. The patient is well groomed. Head: Normocephalic, atraumatic. Neck is supple. Mallampati 3,  neck circumference:16.5  inches . Nasal airflow  patent.  Retrognathia is  seen.  Dental status: small teeth, crowding in lower jaw.  Cardiovascular:  Regular rate and cardiac rhythm by pulse,  without distended neck veins. Respiratory: Lungs are clear to auscultation.  Skin:  Without evidence of ankle edema, or rash. Trunk: The patient's posture is erect.   Neurologic exam : The patient is awake and alert, oriented to place and time.   Memory subjective described as intact.  Attention span & concentration ability appears normal.  Speech is fluent,  without dysarthria, dysphonia or aphasia.  Mood and affect are normal.    Cranial nerves: no loss of smell or taste reported  Pupils are equal and briskly reactive to light. Funduscopic exam deferred. .  Extraocular movements in vertical and horizontal planes were intact and without nystagmus. No Diplopia. Visual fields by finger perimetry are intact. Hearing was intact to soft voice and finger rubbing.    Facial sensation intact to fine touch.  Facial motor strength is symmetric and tongue and uvula move midline.  Neck ROM : rotation, tilt and flexion extension were normal for age and shoulder shrug was symmetrical.    Motor exam:  Symmetric bulk, tone and ROM.   Normal tone without cog wheeling, symmetric grip strength .   Sensory:  Fine touch, pinprick and vibration were tested  and  normal.  Proprioception tested in the upper extremities was normal.   Coordination: Rapid alternating movements  in the fingers/hands were of normal speed.  The Finger-to-nose maneuver was intact without evidence of ataxia, dysmetria or tremor.   Gait and station: Patient could rise unassisted from a seated position, walked without assistive device.  Stance is of normal width/ base.  Toe and heel walk were deferred.  Deep tendon reflexes: in the  upper and lower extremities are symmetric and intact.  Babinski response was deferred.       After spending a total time of  50  minutes face to face and additional time for physical and neurologic examination, review of laboratory studies,  personal review of imaging studies, reports and results of other testing and review of referral information / records as far as provided in visit, I have established the following assessments:  1)  the patient has lost some of her routines and rules since retirement, but she sleeps overall better. Sees no correlation of sleep and headaches. No cluster headaches present.  2) she has risk factors for OSA and wakes sometimes gasping.  3)  new problem  of vertigo, tinnitus, non -pulsatile. Meclizine has helped. BVP.    My Plan is to proceed with:  1) sleep test to rule out increase in apnea hypopnea since 2015-  2) no restless legs  3) some dreams, not nightmarish or vivid.   I would like to thank Bianca Maw, MD and Kathrynn Ducking, [address] [place],  [place] [address] for allowing me to meet with and to take care of this pleasant patient.   In short, Bianca Medina is presenting with non restorative sleep and early morning arousals, daily naps are taken. but no longer reports sleep related headaches.  I plan to follow up either personally or through our NP within 3 month.   CC: I will share my notes with Dr Bianca Medina. .  Electronically signed by: Bianca Seat, MD 01/27/2021 10:16 AM  [facility] Board certified by The [employer] of Sleep  Medicine and Diplomate of the [employer] of Sleep Medicine. Board certified In Neurology through the [employer], Fellow of the [employer] of Neurology. Medical Director of [facility].

## 2021-01-31 ENCOUNTER — Telehealth: Payer: Self-pay | Admitting: Family Medicine

## 2021-01-31 NOTE — Telephone Encounter (Signed)
Please advise message below request for something to take to help ease patient for MRI.

## 2021-02-10 ENCOUNTER — Telehealth: Payer: Self-pay

## 2021-02-10 NOTE — Telephone Encounter (Signed)
Called patient to schedule sleep study - VM full so I could not leave a message.

## 2021-02-18 ENCOUNTER — Telehealth: Payer: Self-pay

## 2021-02-18 NOTE — Telephone Encounter (Signed)
VM full, could not leave message for patient to call back and schedule sleep study

## 2021-02-21 ENCOUNTER — Telehealth: Payer: Self-pay | Admitting: *Deleted

## 2021-02-21 NOTE — Telephone Encounter (Signed)
Detailed message left on voicemail.

## 2021-02-21 NOTE — Telephone Encounter (Signed)
Patient called c/o still having left breast pain had breast imaging done x 2 for left breast pain at the breast center, no findings. Patient now said the right breast is having pain, she asked if order could be faxed to [facility] in [facility] [phone] she would like a second opinion. Please advise

## 2021-02-24 ENCOUNTER — Telehealth: Payer: Self-pay

## 2021-02-24 ENCOUNTER — Ambulatory Visit: Payer: Medicare PPO | Admitting: Family Medicine

## 2021-02-24 NOTE — Telephone Encounter (Signed)
We have attempted to call the patient 2 times to schedule sleep study. Patient has been unavailable at the phone numbers we have on file and has not returned our calls. If patient calls back we will schedule them for their sleep study. ° °

## 2021-03-19 ENCOUNTER — Ambulatory Visit: Payer: Medicare PPO | Admitting: Family Medicine

## 2021-03-19 ENCOUNTER — Other Ambulatory Visit: Payer: Self-pay

## 2021-03-19 ENCOUNTER — Encounter: Payer: Self-pay | Admitting: Family Medicine

## 2021-03-19 ENCOUNTER — Ambulatory Visit (INDEPENDENT_AMBULATORY_CARE_PROVIDER_SITE_OTHER): Payer: Medicare PPO

## 2021-03-19 VITALS — BP 142/80 | HR 71 | Temp 97.7°F | Ht 64.0 in | Wt 267.8 lb

## 2021-03-19 DIAGNOSIS — I10 Essential (primary) hypertension: Secondary | ICD-10-CM | POA: Diagnosis not present

## 2021-03-19 DIAGNOSIS — D1720 Benign lipomatous neoplasm of skin and subcutaneous tissue of unspecified limb: Secondary | ICD-10-CM | POA: Diagnosis not present

## 2021-03-19 DIAGNOSIS — E611 Iron deficiency: Secondary | ICD-10-CM | POA: Diagnosis not present

## 2021-03-19 DIAGNOSIS — E041 Nontoxic single thyroid nodule: Secondary | ICD-10-CM

## 2021-03-19 DIAGNOSIS — R7303 Prediabetes: Secondary | ICD-10-CM

## 2021-03-19 DIAGNOSIS — Z23 Encounter for immunization: Secondary | ICD-10-CM

## 2021-03-19 DIAGNOSIS — E7800 Pure hypercholesterolemia, unspecified: Secondary | ICD-10-CM | POA: Diagnosis not present

## 2021-03-19 DIAGNOSIS — R2242 Localized swelling, mass and lump, left lower limb: Secondary | ICD-10-CM

## 2021-03-19 DIAGNOSIS — E559 Vitamin D deficiency, unspecified: Secondary | ICD-10-CM

## 2021-03-19 LAB — URINALYSIS, ROUTINE W REFLEX MICROSCOPIC
Bilirubin Urine: NEGATIVE
Hgb urine dipstick: NEGATIVE
Ketones, ur: NEGATIVE
Leukocytes,Ua: NEGATIVE
Nitrite: NEGATIVE
RBC / HPF: NONE SEEN (ref 0–?)
Specific Gravity, Urine: >=1.03 — AB (ref 1.000–1.030)
Total Protein, Urine: NEGATIVE
Urine Glucose: NEGATIVE
Urobilinogen, UA: 0.2 (ref 0.0–1.0)
pH: 6 (ref 5.0–8.0)

## 2021-03-19 LAB — CBC
HCT: 38.7 % (ref 36.0–46.0)
Hemoglobin: 12.4 g/dL (ref 12.0–15.0)
MCHC: 32 g/dL (ref 30.0–36.0)
MCV: 77.4 fl — ABNORMAL LOW (ref 78.0–100.0)
Platelets: 233 10*3/uL (ref 150.0–400.0)
RBC: 4.99 Mil/uL (ref 3.87–5.11)
RDW: 14.8 % (ref 11.5–15.5)
WBC: 8.4 10*3/uL (ref 4.0–10.5)

## 2021-03-19 LAB — LIPID PANEL
Cholesterol: 163 mg/dL (ref 0–200)
HDL: 42.7 mg/dL (ref >39.00)
LDL Cholesterol: 105 mg/dL — ABNORMAL HIGH (ref 0–99)
NonHDL: 120.1
Total CHOL/HDL Ratio: 4
Triglycerides: 75 mg/dL (ref 0.0–149.0)
VLDL: 15 mg/dL (ref 0.0–40.0)

## 2021-03-19 LAB — MICROALBUMIN / CREATININE URINE RATIO
Creatinine,U: 201.9 mg/dL
Microalb Creat Ratio: 0.5 mg/g (ref 0.0–30.0)
Microalb, Ur: 1.1 mg/dL (ref 0.0–1.9)

## 2021-03-19 LAB — COMPREHENSIVE METABOLIC PANEL
ALT: 16 U/L (ref 0–35)
AST: 13 U/L (ref 0–37)
Albumin: 3.8 g/dL (ref 3.5–5.2)
Alkaline Phosphatase: 96 U/L (ref 39–117)
BUN: 10 mg/dL (ref 6–23)
CO2: 30 mEq/L (ref 19–32)
Calcium: 8.8 mg/dL (ref 8.4–10.5)
Chloride: 105 mEq/L (ref 96–112)
Creatinine, Ser: 0.77 mg/dL (ref 0.40–1.20)
GFR: 78.99 mL/min (ref >60.00)
Glucose, Bld: 111 mg/dL — ABNORMAL HIGH (ref 70–99)
Potassium: 3.9 mEq/L (ref 3.5–5.1)
Sodium: 141 mEq/L (ref 135–145)
Total Bilirubin: 0.6 mg/dL (ref 0.2–1.2)
Total Protein: 6.6 g/dL (ref 6.0–8.3)

## 2021-03-19 LAB — HEMOGLOBIN A1C: Hgb A1c MFr Bld: 6.9 % — ABNORMAL HIGH (ref 4.6–6.5)

## 2021-03-19 LAB — LDL CHOLESTEROL, DIRECT: Direct LDL: 109 mg/dL

## 2021-03-19 IMAGING — DX DG TIBIA/FIBULA 2V*L*
4 series · 4 of 4 positions shown · non-contrast
Comparison: [DATE]

CLINICAL DATA: Evaluate fatty mass medial to the proximal tibia.

EXAM:
LEFT TIBIA AND FIBULA - 2 VIEW

[lower leg ap]
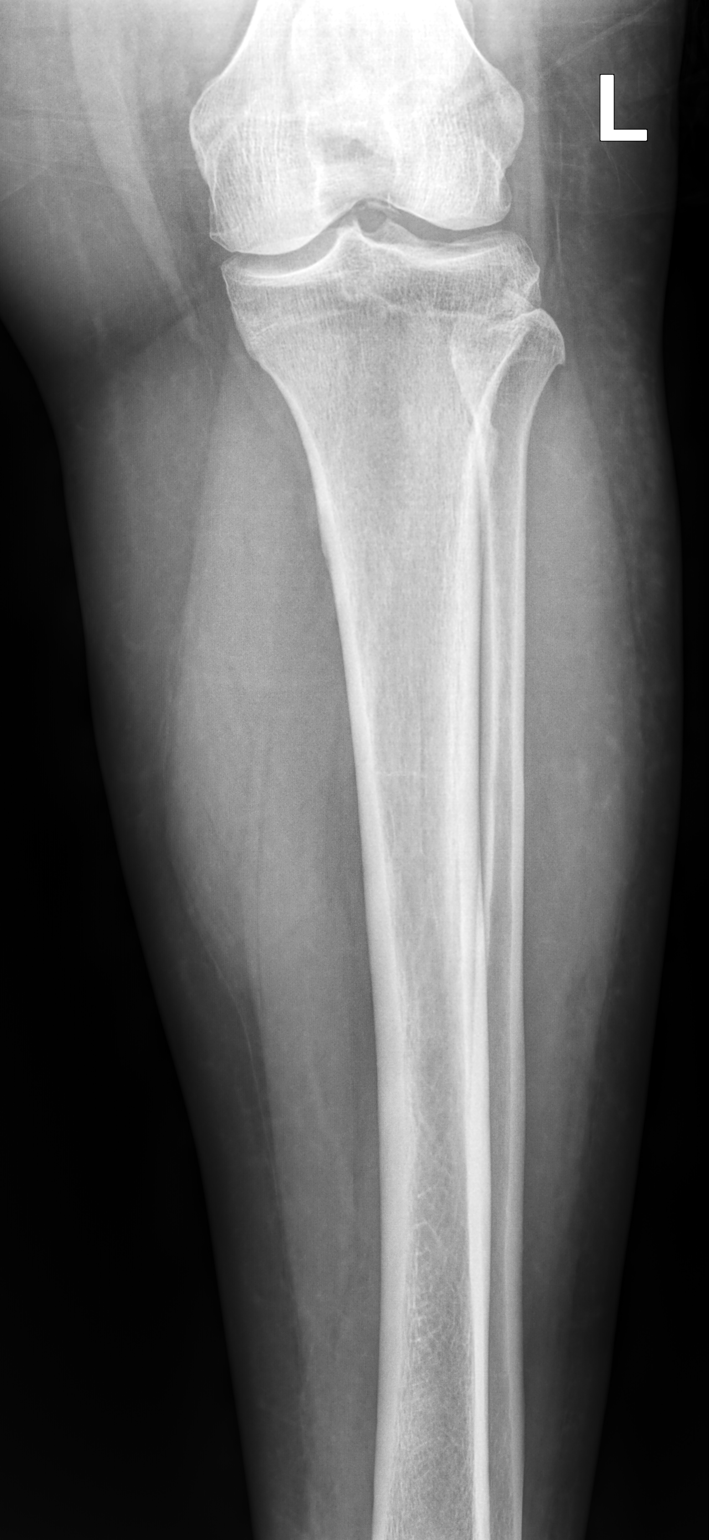

[lower leg lat]
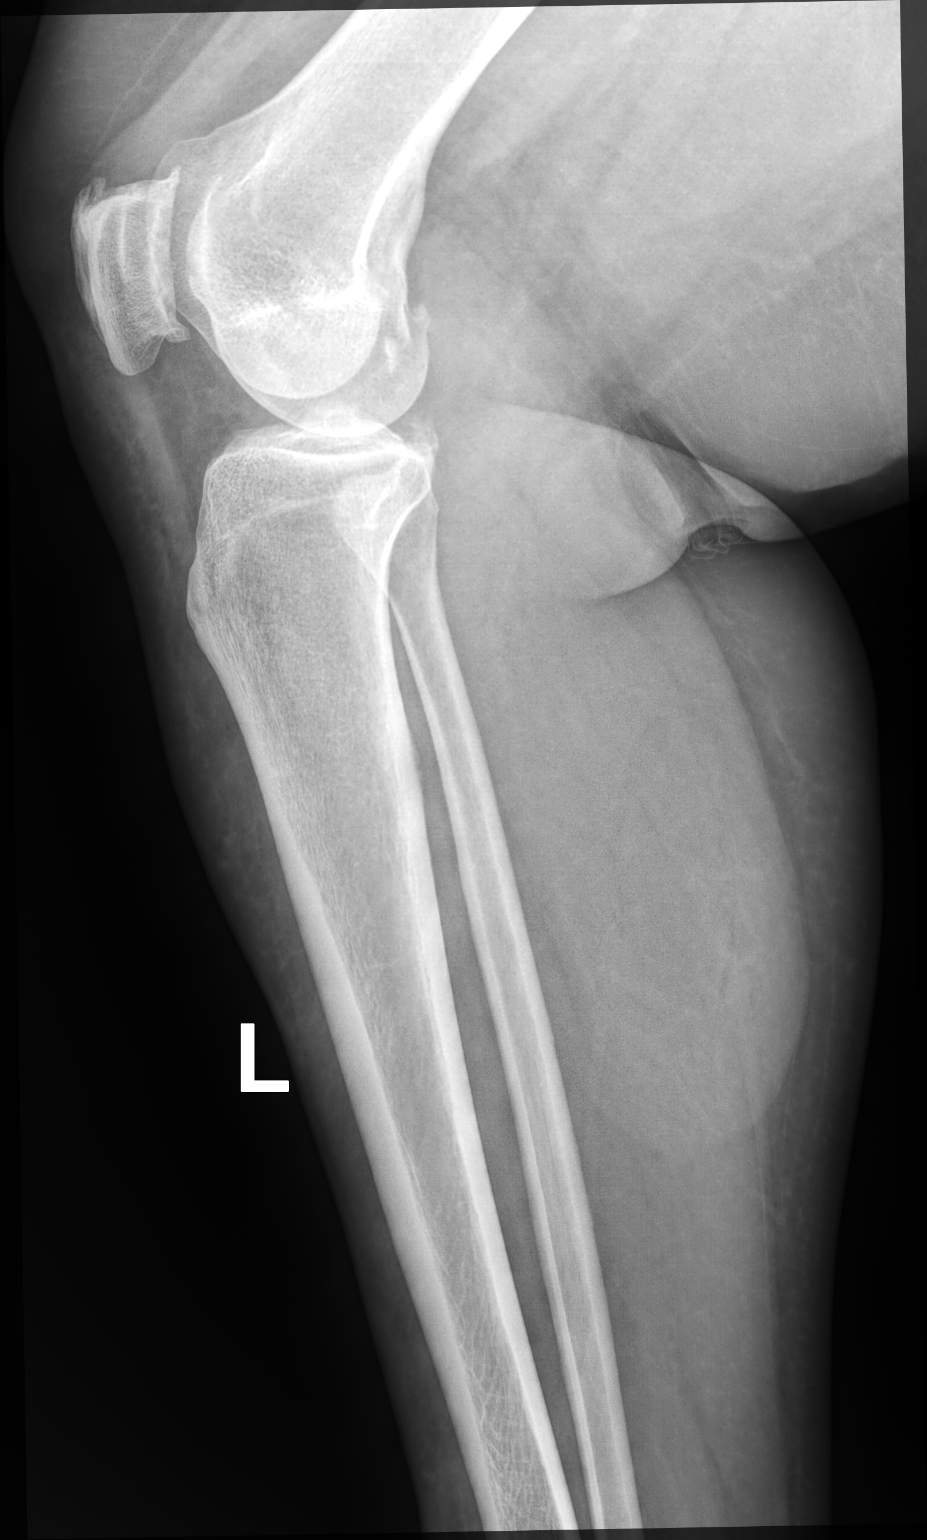

[lower leg with ankle ap]
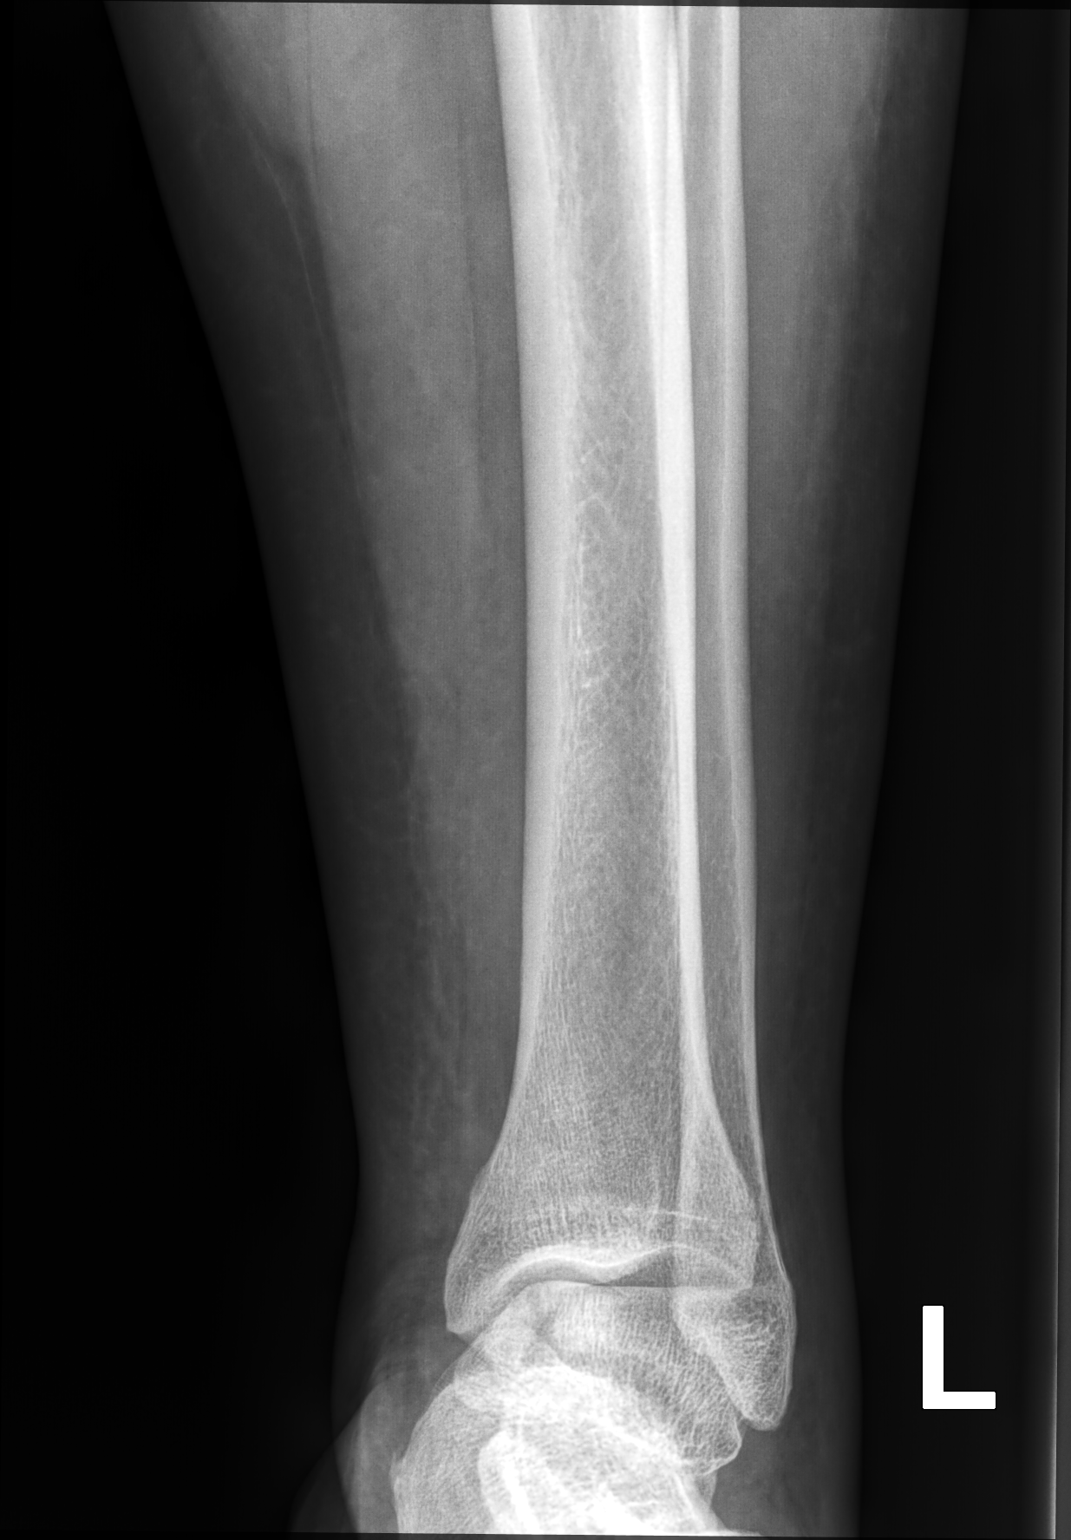

[lower leg with ankle lat]
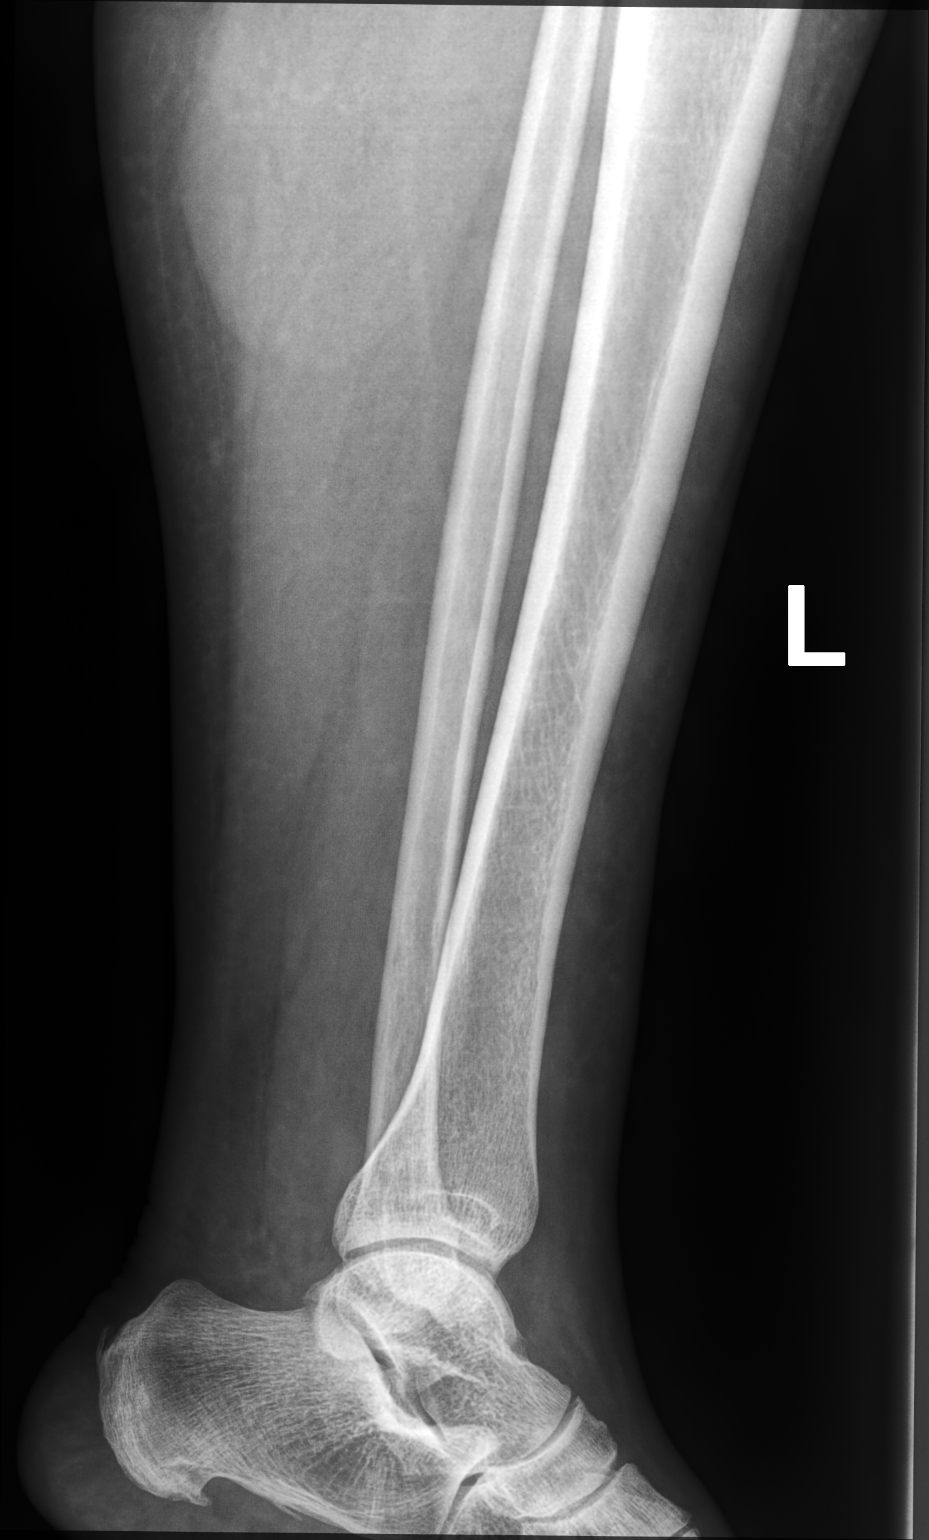

[4 of 4 positions shown; findings below may reference images not displayed]

FINDINGS: There is no evidence of fracture or other focal bone lesions. A
cm x 7.1 cm well-defined area of mildly increased opacification is
seen within the soft tissues of the posteromedial left calf.
IMPRESSION: 1. No acute osseous abnormality.
2. Soft tissue mass within the posteromedial left calf as described
above. Correlation with MRI is recommended.

## 2021-03-19 MED ORDER — LOSARTAN POTASSIUM 50 MG PO TABS: 50.0000 mg | ORAL_TABLET | Freq: Every day | ORAL | 1 refills | Status: DC

## 2021-03-19 NOTE — Progress Notes (Addendum)
Established Patient Office Visit  Subjective:  Patient ID: Bianca Medina, female    DOB: Nov 03, 1950  Age: 70 y.o. MRN: [id number]  CC:  Chief Complaint  Patient presents with   Follow-up    Follow up, would like spot on left leg rechecked.     HPI SHAMEL GALYEAN presents for follow-up of hypertension, elevated cholesterol, prediabetes, vitamin D deficiency and iron deficiency.  She has a spot on her left leg that she would like me to been tolerating the metformin well.  Is planning on picking up her vitamin D prescription today.  It was ordered soon after her last visit.  Taking losartan only for her blood pressure.  She is taking vWomen's 1 a day vitamins.  History of iron deficiency in the past.  Up-to-date on colonoscopy.  No gross hematuria.  Past Medical History:  Diagnosis Date   Anemia 2003   IRON DEFICIENCY    Asthma    Common migraine 06/23/2019   Diabetes mellitus without complication (HCC)    GERD (gastroesophageal reflux disease)    Hyperlipidemia, mild    Hypertension    NSVD (normal spontaneous vaginal delivery)    X3   Obesity    PMDD (premenstrual dysphoric disorder)    Sleep disorder     Past Surgical History:  Procedure Laterality Date   ABDOMINAL HYSTERECTOMY     GUM SURGERY  1980's   TUBAL LIGATION      Family History  Problem Relation Age of Onset   Cancer Mother        LUNG- SMOKER   Diabetes Father    Hypertension Father    Heart disease Father    Stroke Father    Cancer Paternal Aunt        COLON   Cancer Paternal Uncle        COLON   Breast cancer Sister     Social History   Socioeconomic History   Marital status: Single    Spouse name: Not on file   Number of children: 3   Years of education: college   Highest education level: Not on file  Occupational History   Occupation: Architectural technologist: [employer]  Tobacco Use   Smoking status: Never   Smokeless tobacco: Never  Vaping Use    Vaping Use: Never used  Substance and Sexual Activity   Alcohol use: No   Drug use: No   Sexual activity: Not Currently    Birth control/protection: Surgical    Comment: HYSTERECTOMY-1st intercourse 37 yo-5 partners  Other Topics Concern   Not on file  Social History Narrative   Right handed     Caffeine use: has a drink every other day   Social Determinants of Health   Financial Resource Strain: Not on file  Food Insecurity: Not on file  Transportation Needs: Not on file  Physical Activity: Not on file  Stress: Not on file  Social Connections: Not on file  Intimate Partner Violence: Not on file    Outpatient Medications Prior to Visit  Medication Sig Dispense Refill   Bioflavonoid Products (BIOFLEX PO) Take 1 tablet by mouth daily.     Biotin 5000 MCG CAPS Take 5,000 mcg by mouth daily.      esomeprazole (NEXIUM) 40 MG capsule Take 40 mg by mouth daily.      fluticasone (FLONASE) 50 MCG/ACT nasal spray Place 1 spray into both nostrils daily.     ibuprofen (  ADVIL) 600 MG tablet      Loratadine 10 MG CHEW      metFORMIN (GLUCOPHAGE XR) 500 MG 24 hr tablet Take 1 tablet (500 mg total) by mouth daily with breakfast. 90 tablet 1   mometasone-formoterol (DULERA) 100-5 MCG/ACT AERO Inhale 2 puffs into the lungs 2 (two) times daily.     montelukast (SINGULAIR) 10 MG tablet Take 10 mg by mouth daily.     PROAIR HFA 108 (90 Base) MCG/ACT inhaler Inhale 1-2 puffs into the lungs every 4 (four) hours as needed for wheezing or shortness of breath.      SYMBICORT 80-4.5 MCG/ACT inhaler Inhale into the lungs.     Vitamin D, Ergocalciferol, (DRISDOL) 1.25 MG (50000 UNIT) CAPS capsule Take 1 capsule (50,000 Units total) by mouth every 7 (seven) days. 15 capsule 1   losartan (COZAAR) 25 MG tablet Take 1 tablet (25 mg total) by mouth daily. 90 tablet 0   meclizine (ANTIVERT) 12.5 MG tablet Take 1 tablet (12.5 mg total) by mouth 2 (two) times daily as needed for up to 20 doses for dizziness. 20  tablet 0   chlorthalidone (HYGROTON) 25 MG tablet Take 1 tablet (25 mg total) by mouth daily. (Patient not taking: No sig reported) 90 tablet 1   No facility-administered medications prior to visit.    Allergies  Allergen Reactions   Codeine Other (See Comments)    nightmares Other reaction(s): Other nightmares nightmares    ROS Review of Systems  Constitutional: Negative.   HENT: Negative.    Eyes:  Negative for photophobia and visual disturbance.  Respiratory: Negative.    Cardiovascular: Negative.   Gastrointestinal: Negative.   Endocrine: Negative for polyphagia and polyuria.  Genitourinary:  Negative for difficulty urinating and hematuria.  Musculoskeletal:  Negative for gait problem and joint swelling.  Neurological:  Negative for speech difficulty and weakness.     Objective:    Physical Exam Vitals and nursing note reviewed.  Constitutional:      General: She is not in acute distress.    Appearance: Normal appearance. She is not ill-appearing, toxic-appearing or diaphoretic.  HENT:     Head: Normocephalic and atraumatic.     Right Ear: Tympanic membrane, ear canal and external ear normal.     Left Ear: Tympanic membrane, ear canal and external ear normal.     Mouth/Throat:     Mouth: Mucous membranes are moist.     Pharynx: Oropharynx is clear. No oropharyngeal exudate or posterior oropharyngeal erythema.  Eyes:     General: No scleral icterus.       Right eye: No discharge.        Left eye: No discharge.     Extraocular Movements: Extraocular movements intact.     Conjunctiva/sclera: Conjunctivae normal.     Pupils: Pupils are equal, round, and reactive to light.  Neck:     Vascular: No carotid bruit.  Cardiovascular:     Rate and Rhythm: Normal rate and regular rhythm.  Pulmonary:     Effort: Pulmonary effort is normal.     Breath sounds: Normal breath sounds.  Abdominal:     General: Bowel sounds are normal.  Musculoskeletal:     Cervical back:  No rigidity or tenderness.     Right lower leg: No edema.     Left lower leg: No edema.  Lymphadenopathy:     Cervical: No cervical adenopathy.  Skin:    General: Skin is warm and dry.  Neurological:  Mental Status: She is alert and oriented to person, place, and time.  Psychiatric:        Mood and Affect: Mood normal.        Behavior: Behavior normal.    BP (!) 142/80 (BP Location: Left Arm, Patient Position: Sitting, Cuff Size: Large)   Pulse 71   Temp 97.7 F (36.5 C) (Temporal)   Ht 5\' 4"  (1.626 m)   Wt 267 lb 12.8 oz (121.5 kg)   SpO2 96%   BMI 45.97 kg/m  Wt Readings from Last 3 Encounters:  03/19/21 267 lb 12.8 oz (121.5 kg)  01/27/21 275 lb (124.7 kg)  12/26/20 270 lb 6.4 oz (122.7 kg)     Health Maintenance Due  Topic Date Due   Zoster Vaccines- Shingrix (1 of 2) Never done   Pneumonia Vaccine 74+ Years old (2 - PPSV23 if available, else PCV20) 04/25/2013    There are no preventive care reminders to display for this patient.  Lab Results  Component Value Date   TSH 0.79 04/22/2012   Lab Results  Component Value Date   WBC 8.4 03/19/2021   HGB 12.4 03/19/2021   HCT 38.7 03/19/2021   MCV 77.4 (L) 03/19/2021   PLT 233.0 03/19/2021   Lab Results  Component Value Date   NA 141 03/19/2021   K 3.9 03/19/2021   CO2 30 03/19/2021   GLUCOSE 111 (H) 03/19/2021   BUN 10 03/19/2021   CREATININE 0.77 03/19/2021   BILITOT 0.6 03/19/2021   ALKPHOS 96 03/19/2021   AST 13 03/19/2021   ALT 16 03/19/2021   PROT 6.6 03/19/2021   ALBUMIN 3.8 03/19/2021   CALCIUM 8.8 03/19/2021   ANIONGAP 7 12/22/2020   GFR 78.99 03/19/2021   Lab Results  Component Value Date   CHOL 163 03/19/2021   Lab Results  Component Value Date   HDL 42.70 03/19/2021   Lab Results  Component Value Date   LDLCALC 105 (H) 03/19/2021   Lab Results  Component Value Date   TRIG 75.0 03/19/2021   Lab Results  Component Value Date   CHOLHDL 4 03/19/2021   Lab Results   Component Value Date   HGBA1C 6.9 (H) 03/19/2021      Assessment & Plan:   Problem List Items Addressed This Visit       Cardiovascular and Mediastinum   Essential hypertension   Relevant Medications   losartan (COZAAR) 50 MG tablet   Other Relevant Orders   Comprehensive metabolic panel (Completed)     Other   Pre-diabetes   Relevant Orders   Hemoglobin A1c (Completed)   Urinalysis, Routine w reflex microscopic (Completed)   Microalbumin / creatinine urine ratio (Completed)   Vitamin D deficiency   Iron deficiency   Relevant Orders   CBC (Completed)   Iron, TIBC and Ferritin Panel (Completed)   Need for influenza vaccination - Primary   Relevant Orders   Flu Vaccine QUAD High Dose(Fluad) (Completed)   Other Visit Diagnoses     Thyroid nodule       Lipoma of lower leg       Relevant Orders   DG Tibia/Fibula Left (Completed)   Elevated cholesterol       Relevant Medications   losartan (COZAAR) 50 MG tablet   Other Relevant Orders   LDL cholesterol, direct (Completed)   Lipid panel (Completed)   Mass of left lower leg       Relevant Orders   MR TIBIA FIBULA LEFT W  WO CONTRAST       Meds ordered this encounter  Medications   losartan (COZAAR) 50 MG tablet    Sig: Take 1 tablet (50 mg total) by mouth daily.    Dispense:  90 tablet    Refill:  1    Follow-up: Return in about 3 months (around 06/19/2021).  Have increased losartan to 50 mg daily.  We will send for thyroid ultrasound with nodule felt on exam today.  Plain film of left tibia and fib for mass noted on proximal medial left hip.  Patient is planning on picking up her vitamin D today.  She is taking a woman's vitamin with iron and it.  May need to supplement with additional iron.  Rechecking hemoglobin A1c.  Tolerating metformin.  Libby Maw, MD

## 2021-03-20 DIAGNOSIS — R2242 Localized swelling, mass and lump, left lower limb: Secondary | ICD-10-CM | POA: Diagnosis not present

## 2021-03-20 LAB — IRON,TIBC AND FERRITIN PANEL
%SAT: 18 % (calc) (ref 16–45)
Ferritin: 26 ng/mL (ref 16–288)
Iron: 57 ug/dL (ref 45–160)
TIBC: 319 mcg/dL (calc) (ref 250–450)

## 2021-03-24 NOTE — Addendum Note (Signed)
Addended by: Jon Billings on: 03/24/2021 09:02 AM   Modules accepted: Orders

## 2021-03-28 ENCOUNTER — Other Ambulatory Visit: Payer: Self-pay

## 2021-03-28 ENCOUNTER — Telehealth: Payer: Self-pay

## 2021-03-28 ENCOUNTER — Ambulatory Visit: Payer: Medicare PPO | Admitting: Nurse Practitioner

## 2021-03-28 VITALS — BP 148/82 | HR 69 | Temp 98.1°F | Resp 14 | Ht 64.0 in | Wt 265.4 lb

## 2021-03-28 DIAGNOSIS — R21 Rash and other nonspecific skin eruption: Secondary | ICD-10-CM

## 2021-03-28 MED ORDER — METHYLPREDNISOLONE ACETATE 40 MG/ML IJ SUSP
40.0000 mg | Freq: Once | INTRAMUSCULAR | Status: AC
Start: 2021-03-28 — End: 2021-03-28
  Administered 2021-03-28: 40 mg via INTRAMUSCULAR

## 2021-03-28 NOTE — Telephone Encounter (Signed)
Patient was in the office today seen Romilda Garret with [facility]. Patient was confused about Losartan dose that was sent in earlier in November. Patient states she has been taking Losartan 25 mg once daily but the new RX was for 50 mg. Please let patient know how to proceed today if possible. Patient did not fill the 50 mg tablets yet. She has some of the 25 mg dose tablets left. Thank you

## 2021-03-28 NOTE — Progress Notes (Signed)
Acute Office Visit  Subjective:    Patient ID: Bianca Medina, female    DOB: 03-Dec-1950, 70 y.o.   MRN: [id number]  Chief Complaint  Patient presents with   Rash    Started on left foot-earlier this week, now rash is on mid abdomen, right side of abdomen and back. Red, itching, soreness present. No fever.      Patient is in today for Rash On foot started earlier this week Sunday or Monday. Looks as if she was bitten by something on the foot but not rash present.  The rash belly Wednesday and spread to right side Itches and tender with touch No new medications or otc meds. No changes in detergents Grandson stays with her but not having a rash  Has been using green alcohol has helped minimally  Past Medical History:  Diagnosis Date   Anemia 2003   IRON DEFICIENCY    Asthma    Common migraine 06/23/2019   Diabetes mellitus without complication (HCC)    GERD (gastroesophageal reflux disease)    Hyperlipidemia, mild    Hypertension    NSVD (normal spontaneous vaginal delivery)    X3   Obesity    PMDD (premenstrual dysphoric disorder)    Sleep disorder     Past Surgical History:  Procedure Laterality Date   ABDOMINAL HYSTERECTOMY     GUM SURGERY  1980's   TUBAL LIGATION      Family History  Problem Relation Age of Onset   Cancer Mother        LUNG- SMOKER   Diabetes Father    Hypertension Father    Heart disease Father    Stroke Father    Cancer Paternal Aunt        COLON   Cancer Paternal Uncle        COLON   Breast cancer Sister     Social History   Socioeconomic History   Marital status: Single    Spouse name: Not on file   Number of children: 3   Years of education: college   Highest education level: Not on file  Occupational History   Occupation: Architectural technologist: [employer]  Tobacco Use   Smoking status: Never   Smokeless tobacco: Never  Vaping Use   Vaping Use: Never used  Substance and  Sexual Activity   Alcohol use: No   Drug use: No   Sexual activity: Not Currently    Birth control/protection: Surgical    Comment: HYSTERECTOMY-1st intercourse 55 yo-5 partners  Other Topics Concern   Not on file  Social History Narrative   Right handed     Caffeine use: has a drink every other day   Social Determinants of Health   Financial Resource Strain: Not on file  Food Insecurity: Not on file  Transportation Needs: Not on file  Physical Activity: Not on file  Stress: Not on file  Social Connections: Not on file  Intimate Partner Violence: Not on file    Outpatient Medications Prior to Visit  Medication Sig Dispense Refill   Bioflavonoid Products (BIOFLEX PO) Take 1 tablet by mouth daily.     Biotin 5000 MCG CAPS Take 5,000 mcg by mouth daily.      esomeprazole (NEXIUM) 40 MG capsule Take 40 mg by mouth daily.      fluticasone (FLONASE) 50 MCG/ACT nasal spray Place 1 spray into both nostrils daily.     ibuprofen (ADVIL) 600 MG  tablet      Loratadine 10 MG CHEW      losartan (COZAAR) 50 MG tablet Take 1 tablet (50 mg total) by mouth daily. 90 tablet 1   metFORMIN (GLUCOPHAGE XR) 500 MG 24 hr tablet Take 1 tablet (500 mg total) by mouth daily with breakfast. 90 tablet 1   montelukast (SINGULAIR) 10 MG tablet Take 10 mg by mouth daily.     PROAIR HFA 108 (90 Base) MCG/ACT inhaler Inhale 1-2 puffs into the lungs every 4 (four) hours as needed for wheezing or shortness of breath.      SYMBICORT 80-4.5 MCG/ACT inhaler Inhale into the lungs.     Vitamin D, Ergocalciferol, (DRISDOL) 1.25 MG (50000 UNIT) CAPS capsule Take 1 capsule (50,000 Units total) by mouth every 7 (seven) days. 15 capsule 1   mometasone-formoterol (DULERA) 100-5 MCG/ACT AERO Inhale 2 puffs into the lungs 2 (two) times daily.     No facility-administered medications prior to visit.    Allergies  Allergen Reactions   Codeine Other (See Comments)    nightmares Other reaction(s):  Other nightmares nightmares    Review of Systems  Constitutional:  Positive for chills. Negative for fever.  Respiratory:  Negative for cough and shortness of breath.   Cardiovascular:  Negative for chest pain.  Gastrointestinal:  Negative for diarrhea, nausea and vomiting.  Skin:  Positive for rash.       Itching       Objective:    Physical Exam Vitals and nursing note reviewed.  Constitutional:      Appearance: She is obese.  Cardiovascular:     Rate and Rhythm: Normal rate and regular rhythm.  Pulmonary:     Effort: Pulmonary effort is normal.     Breath sounds: Normal breath sounds.  Abdominal:     General: Bowel sounds are normal.  Skin:    General: Skin is warm.     Findings: Erythema present.       Neurological:     Mental Status: She is alert.  Psychiatric:        Mood and Affect: Mood normal.        Behavior: Behavior normal.        Thought Content: Thought content normal.        Judgment: Judgment normal.    BP (!) 148/82   Pulse 69   Temp 98.1 F (36.7 C)   Resp 14   Ht 5\' 4"  (1.626 m)   Wt 265 lb 6 oz (120.4 kg)   SpO2 96%   BMI 45.55 kg/m  Wt Readings from Last 3 Encounters:  03/28/21 265 lb 6 oz (120.4 kg)  03/19/21 267 lb 12.8 oz (121.5 kg)  01/27/21 275 lb (124.7 kg)    Health Maintenance Due  Topic Date Due   Zoster Vaccines- Shingrix (1 of 2) Never done   Pneumonia Vaccine 41+ Years old (2 - PPSV23 if available, else PCV20) 04/25/2013    There are no preventive care reminders to display for this patient.   Lab Results  Component Value Date   TSH 0.79 04/22/2012   Lab Results  Component Value Date   WBC 8.4 03/19/2021   HGB 12.4 03/19/2021   HCT 38.7 03/19/2021   MCV 77.4 (L) 03/19/2021   PLT 233.0 03/19/2021   Lab Results  Component Value Date   NA 141 03/19/2021   K 3.9 03/19/2021   CO2 30 03/19/2021   GLUCOSE 111 (H) 03/19/2021   BUN 10  03/19/2021   CREATININE 0.77 03/19/2021   BILITOT 0.6 03/19/2021    ALKPHOS 96 03/19/2021   AST 13 03/19/2021   ALT 16 03/19/2021   PROT 6.6 03/19/2021   ALBUMIN 3.8 03/19/2021   CALCIUM 8.8 03/19/2021   ANIONGAP 7 12/22/2020   GFR 78.99 03/19/2021   Lab Results  Component Value Date   CHOL 163 03/19/2021   Lab Results  Component Value Date   HDL 42.70 03/19/2021   Lab Results  Component Value Date   LDLCALC 105 (H) 03/19/2021   Lab Results  Component Value Date   TRIG 75.0 03/19/2021   Lab Results  Component Value Date   CHOLHDL 4 03/19/2021   Lab Results  Component Value Date   HGBA1C 6.9 (H) 03/19/2021       Assessment & Plan:   Problem List Items Addressed This Visit       Musculoskeletal and Integument   Rash and nonspecific skin eruption - Primary    Patient is dealing with a rash for the past couple days it seems to be spreading in nature.  Nonspecific skin eruption does not look like shingles.  Patient has had shingles in the past.  We will give her methylprednisolone acetate 40 mg injection in office.  Gave over-the-counter treatment suggestions including cool compresses and keeping skin moisturized she is already on antihistamine daily.  Continue to monitor if symptoms worsen or fail to improve she can follow-up in office.  Did discuss signs and symptoms when she needs to be seen in urgent or emergently.        No orders of the defined types were placed in this encounter.  This visit occurred during the SARS-CoV-2 public health emergency.  Safety protocols were in place, including screening questions prior to the visit, additional usage of staff PPE, and extensive cleaning of exam room while observing appropriate contact time as indicated for disinfecting solutions.   Romilda Garret, NP

## 2021-03-28 NOTE — Telephone Encounter (Signed)
Please advise message below  °

## 2021-03-28 NOTE — Telephone Encounter (Signed)
Patient aware that she should be on 50mg  of losartan. Per patient she will go pick up and start today.

## 2021-03-28 NOTE — Assessment & Plan Note (Signed)
Patient is dealing with a rash for the past couple days it seems to be spreading in nature.  Nonspecific skin eruption does not look like shingles.  Patient has had shingles in the past.  We will give her methylprednisolone acetate 40 mg injection in office.  Gave over-the-counter treatment suggestions including cool compresses and keeping skin moisturized she is already on antihistamine daily.  Continue to monitor if symptoms worsen or fail to improve she can follow-up in office.  Did discuss signs and symptoms when she needs to be seen in urgent or emergently.

## 2021-03-28 NOTE — Patient Instructions (Signed)
Nice to see you  Will give you a dose of steroids that should help Continue to monitor it Use cool compresses on it to help it from itching Make sure you keep the skin moisturized too.

## 2021-04-01 ENCOUNTER — Other Ambulatory Visit: Payer: Self-pay

## 2021-04-01 ENCOUNTER — Ambulatory Visit: Payer: Medicare PPO | Admitting: Nurse Practitioner

## 2021-04-01 VITALS — BP 148/86 | HR 83 | Temp 98.3°F | Resp 12 | Ht 64.0 in | Wt 263.5 lb

## 2021-04-01 DIAGNOSIS — S60861A Insect bite (nonvenomous) of right wrist, initial encounter: Secondary | ICD-10-CM | POA: Diagnosis not present

## 2021-04-01 DIAGNOSIS — R21 Rash and other nonspecific skin eruption: Secondary | ICD-10-CM | POA: Diagnosis not present

## 2021-04-01 DIAGNOSIS — S1086XA Insect bite of other specified part of neck, initial encounter: Secondary | ICD-10-CM | POA: Diagnosis not present

## 2021-04-01 DIAGNOSIS — Z1211 Encounter for screening for malignant neoplasm of colon: Secondary | ICD-10-CM | POA: Diagnosis not present

## 2021-04-01 DIAGNOSIS — W57XXXA Bitten or stung by nonvenomous insect and other nonvenomous arthropods, initial encounter: Secondary | ICD-10-CM | POA: Diagnosis not present

## 2021-04-01 MED ORDER — TRIAMCINOLONE ACETONIDE 0.1 % EX CREA: 1.0000 "application " | TOPICAL_CREAM | Freq: Two times a day (BID) | CUTANEOUS | 0 refills | Status: DC

## 2021-04-01 NOTE — Patient Instructions (Signed)
Nice to see you Place the cream around the bites not on them Use it for no more than 7 days and use the smallest amount possible Follow up if no improvement

## 2021-04-01 NOTE — Assessment & Plan Note (Signed)
The original rash has resolved and gotten better with injection of IM steroid.  Patient did request to be sent to dermatologist for skin check.  Referral placed

## 2021-04-01 NOTE — Assessment & Plan Note (Signed)
Patient seems to have been bitten by an insect with a local reaction to the wrist and neck we will start her on some triamcinolone cream medium potency steroid did discuss appropriate use avoiding open spots or the bite itself is using it around the area where the insect bit.  Did discuss about watching out for continued redness spreading as it could turn into cellulitis with her scratching so much patient acknowledged.  Did discuss precautions in regards to steroids about skin thinning and lightening of skin with long-term use smallest amount possible twice daily for no more than a week.  Patient knowledge continue to monitor

## 2021-04-01 NOTE — Progress Notes (Signed)
Acute Office Visit  Subjective:    Patient ID: Bianca Medina, female    DOB: Apr 09, 1951, 70 y.o.   MRN: [id number]  Chief Complaint  Patient presents with   Neck swelling    Swelling and redness on right side of the neck, also swelling and redness and itching on right wrist area. Patient was seen on 03/28/21 for rash and itching but spots were on different parts of her body then.    HPI Patient is in today for Neck swelling and rash.  Was seen Friday for a different rash was given IM injection of steroid and it resolved  Started yesterday morning. States that she woke up with it and it is itching. Both spots on her right wrist and right lateral neck. Does not remember anything biting her. Grandson does stay on occasion but has not been there for the last 3-4 days.   Has been taking her antihistamine and used green alcohol on it  Past Medical History:  Diagnosis Date   Anemia 2003   IRON DEFICIENCY    Asthma    Common migraine 06/23/2019   Diabetes mellitus without complication (HCC)    GERD (gastroesophageal reflux disease)    Hyperlipidemia, mild    Hypertension    NSVD (normal spontaneous vaginal delivery)    X3   Obesity    PMDD (premenstrual dysphoric disorder)    Sleep disorder     Past Surgical History:  Procedure Laterality Date   ABDOMINAL HYSTERECTOMY     GUM SURGERY  1980's   TUBAL LIGATION      Family History  Problem Relation Age of Onset   Cancer Mother        LUNG- SMOKER   Diabetes Father    Hypertension Father    Heart disease Father    Stroke Father    Cancer Paternal Aunt        COLON   Cancer Paternal Uncle        COLON   Breast cancer Sister     Social History   Socioeconomic History   Marital status: Single    Spouse name: Not on file   Number of children: 3   Years of education: college   Highest education level: Not on file  Occupational History   Occupation: Architectural technologist: [employer]  Tobacco Use   Smoking status: Never   Smokeless tobacco: Never  Vaping Use   Vaping Use: Never used  Substance and Sexual Activity   Alcohol use: No   Drug use: No   Sexual activity: Not Currently    Birth control/protection: Surgical    Comment: HYSTERECTOMY-1st intercourse 42 yo-5 partners  Other Topics Concern   Not on file  Social History Narrative   Right handed     Caffeine use: has a drink every other day   Social Determinants of Health   Financial Resource Strain: Not on file  Food Insecurity: Not on file  Transportation Needs: Not on file  Physical Activity: Not on file  Stress: Not on file  Social Connections: Not on file  Intimate Partner Violence: Not on file    Outpatient Medications Prior to Visit  Medication Sig Dispense Refill   Bioflavonoid Products (BIOFLEX PO) Take 1 tablet by mouth daily.     Biotin 5000 MCG CAPS Take 5,000 mcg by mouth daily.      esomeprazole (NEXIUM) 40 MG capsule Take 40 mg by mouth daily.  fluticasone (FLONASE) 50 MCG/ACT nasal spray Place 1 spray into both nostrils daily.     ibuprofen (ADVIL) 600 MG tablet      Loratadine 10 MG CHEW      losartan (COZAAR) 50 MG tablet Take 1 tablet (50 mg total) by mouth daily. 90 tablet 1   metFORMIN (GLUCOPHAGE XR) 500 MG 24 hr tablet Take 1 tablet (500 mg total) by mouth daily with breakfast. 90 tablet 1   montelukast (SINGULAIR) 10 MG tablet Take 10 mg by mouth daily.     PROAIR HFA 108 (90 Base) MCG/ACT inhaler Inhale 1-2 puffs into the lungs every 4 (four) hours as needed for wheezing or shortness of breath.      SYMBICORT 80-4.5 MCG/ACT inhaler Inhale into the lungs.     Vitamin D, Ergocalciferol, (DRISDOL) 1.25 MG (50000 UNIT) CAPS capsule Take 1 capsule (50,000 Units total) by mouth every 7 (seven) days. 15 capsule 1   No facility-administered medications prior to visit.    Allergies  Allergen Reactions   Codeine Other (See Comments)    nightmares Other  reaction(s): Other nightmares nightmares    Review of Systems  Constitutional:  Negative for chills and fever.  Respiratory:  Negative for cough and shortness of breath.   Cardiovascular:  Negative for chest pain.  Gastrointestinal:  Negative for diarrhea, nausea and vomiting.  Skin:  Positive for color change and rash.       itching      Objective:    Physical Exam Vitals and nursing note reviewed.  Constitutional:      Appearance: She is obese.  Cardiovascular:     Rate and Rhythm: Normal rate and regular rhythm.     Pulses: Normal pulses.  Pulmonary:     Effort: Pulmonary effort is normal.     Breath sounds: Normal breath sounds.  Musculoskeletal:        General: Swelling present. No tenderness, deformity or signs of injury.  Lymphadenopathy:     Cervical: No cervical adenopathy.  Skin:    General: Skin is warm.     Findings: Erythema and lesion present.     Comments: Erythema and edema on right wrist. Looks as if she was bitten in two spots by an insect.  Linear raised pruritic rash on her right lateral neck. See photos  No signs of abscess or cellulitis yet. The white spot in the picture is dry skin/scab  Neurological:     General: No focal deficit present.     Mental Status: She is alert.        BP (!) 148/86   Pulse 83   Temp 98.3 F (36.8 C)   Resp 12   Ht 5\' 4"  (1.626 m)   Wt 263 lb 8 oz (119.5 kg)   SpO2 99%   BMI 45.23 kg/m  Wt Readings from Last 3 Encounters:  04/01/21 263 lb 8 oz (119.5 kg)  03/28/21 265 lb 6 oz (120.4 kg)  03/19/21 267 lb 12.8 oz (121.5 kg)    Health Maintenance Due  Topic Date Due   Zoster Vaccines- Shingrix (1 of 2) Never done   Pneumonia Vaccine 88+ Years old (2 - PPSV23 if available, else PCV20) 04/25/2013    There are no preventive care reminders to display for this patient.   Lab Results  Component Value Date   TSH 0.79 04/22/2012   Lab Results  Component Value Date   WBC 8.4 03/19/2021   HGB 12.4  03/19/2021  HCT 38.7 03/19/2021   MCV 77.4 (L) 03/19/2021   PLT 233.0 03/19/2021   Lab Results  Component Value Date   NA 141 03/19/2021   K 3.9 03/19/2021   CO2 30 03/19/2021   GLUCOSE 111 (H) 03/19/2021   BUN 10 03/19/2021   CREATININE 0.77 03/19/2021   BILITOT 0.6 03/19/2021   ALKPHOS 96 03/19/2021   AST 13 03/19/2021   ALT 16 03/19/2021   PROT 6.6 03/19/2021   ALBUMIN 3.8 03/19/2021   CALCIUM 8.8 03/19/2021   ANIONGAP 7 12/22/2020   GFR 78.99 03/19/2021   Lab Results  Component Value Date   CHOL 163 03/19/2021   Lab Results  Component Value Date   HDL 42.70 03/19/2021   Lab Results  Component Value Date   LDLCALC 105 (H) 03/19/2021   Lab Results  Component Value Date   TRIG 75.0 03/19/2021   Lab Results  Component Value Date   CHOLHDL 4 03/19/2021   Lab Results  Component Value Date   HGBA1C 6.9 (H) 03/19/2021       Assessment & Plan:   Problem List Items Addressed This Visit       Musculoskeletal and Integument   Rash    The original rash has resolved and gotten better with injection of IM steroid.  Patient did request to be sent to dermatologist for skin check.  Referral placed      Relevant Orders   Ambulatory referral to Dermatology   Insect bite of right wrist   Relevant Medications   triamcinolone cream (KENALOG) 0.1 %     Other   Screening for colon cancer    Patient states this time for her to have a colonoscopy but wanted a different provider than the one that it last time.  We will refer her to one of our GI did inform her the weight of a couple months but they should be in contact with her.  Referral placed      Relevant Orders   Ambulatory referral to Gastroenterology   Bite, insect - Primary    Patient seems to have been bitten by an insect with a local reaction to the wrist and neck we will start her on some triamcinolone cream medium potency steroid did discuss appropriate use avoiding open spots or the bite itself is using  it around the area where the insect bit.  Did discuss about watching out for continued redness spreading as it could turn into cellulitis with her scratching so much patient acknowledged.  Did discuss precautions in regards to steroids about skin thinning and lightening of skin with long-term use smallest amount possible twice daily for no more than a week.  Patient knowledge continue to monitor      Relevant Medications   triamcinolone cream (KENALOG) 0.1 %     No orders of the defined types were placed in this encounter.  This visit occurred during the SARS-CoV-2 public health emergency.  Safety protocols were in place, including screening questions prior to the visit, additional usage of staff PPE, and extensive cleaning of exam room while observing appropriate contact time as indicated for disinfecting solutions.   Romilda Garret, NP

## 2021-04-01 NOTE — Assessment & Plan Note (Signed)
Patient states this time for her to have a colonoscopy but wanted a different provider than the one that it last time.  We will refer her to one of our GI did inform her the weight of a couple months but they should be in contact with her.  Referral placed

## 2021-04-19 ENCOUNTER — Ambulatory Visit (HOSPITAL_BASED_OUTPATIENT_CLINIC_OR_DEPARTMENT_OTHER): Payer: Medicare PPO

## 2021-04-21 ENCOUNTER — Telehealth: Payer: Self-pay | Admitting: Family Medicine

## 2021-04-21 NOTE — Telephone Encounter (Signed)
Pt cancelled appt 04/19/21 for MRI and asked for call from office. I called pt and she was asking reason for order. Reread notes from Dr. Ethelene Hal 03/24/21. Pt will reschedule after beginning of the year. She'll have new in with $0 deductible.

## 2021-04-21 NOTE — Telephone Encounter (Signed)
FYI message below.

## 2021-04-26 ENCOUNTER — Other Ambulatory Visit: Payer: Self-pay | Admitting: Family Medicine

## 2021-04-26 ENCOUNTER — Other Ambulatory Visit: Payer: Self-pay

## 2021-04-26 ENCOUNTER — Ambulatory Visit (HOSPITAL_BASED_OUTPATIENT_CLINIC_OR_DEPARTMENT_OTHER)
Admission: RE | Admit: 2021-04-26 | Discharge: 2021-04-26 | Disposition: A | Discharge status: Home / Self Care | Payer: Medicare PPO | Source: Ambulatory Visit | Attending: Family Medicine | Admitting: Family Medicine

## 2021-04-26 DIAGNOSIS — R2242 Localized swelling, mass and lump, left lower limb: Secondary | ICD-10-CM | POA: Insufficient documentation

## 2021-04-26 DIAGNOSIS — M1712 Unilateral primary osteoarthritis, left knee: Secondary | ICD-10-CM | POA: Diagnosis not present

## 2021-04-26 DIAGNOSIS — I10 Essential (primary) hypertension: Secondary | ICD-10-CM

## 2021-04-26 IMAGING — MR MR [PERSON_NAME] LOW WO/W CM*L*
8 series · 40 of 40 positions shown · IV contrast (GADAVIST)
Comparison: X-ray [DATE]

CLINICAL DATA: Soft tissue mass, lower leg, superficial. Anterior
proximal tibial mass present for 9 months. No known injury. No
associated pain

EXAM:
MRI OF LOWER LEFT EXTREMITY WITHOUT AND WITH CONTRAST
TECHNIQUE: Multiplanar, multisequence MR imaging of the left tibia and fibula
was performed both before and after administration of intravenous
contrast. Field of view centered at the area of interest and extends
from the level of the knee to the level of the distal tibial
diaphysis.
CONTRAST:  10mL GADAVIST GADOBUTROL 1 MMOL/ML IV SOLN

[Series 3: T1 · coronal · 5.0mm · 1.48mm/px · 5 of 25 slices shown (1 of 2)]
[im 1/25]
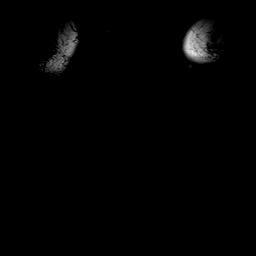
[im 7/25]
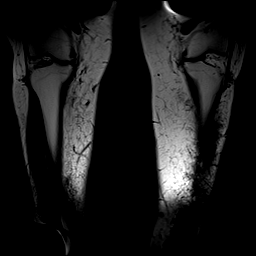
[im 13/25]
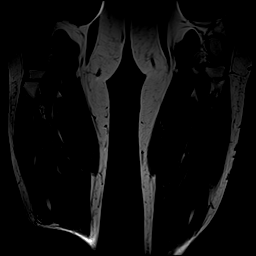
[im 19/25]
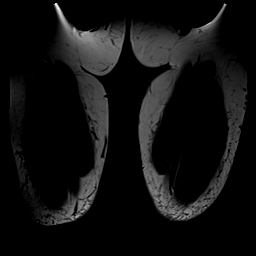
[im 25/25]
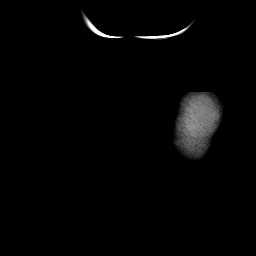

[Series 4: STIR · coronal · 5.0mm · 1.48mm/px · 5 of 25 slices shown (1 of 2)]
[im 1/25]
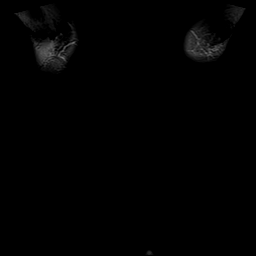
[im 7/25]
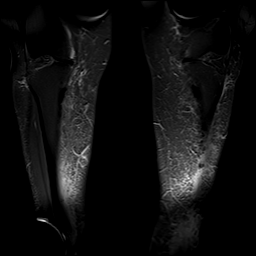
[im 13/25]
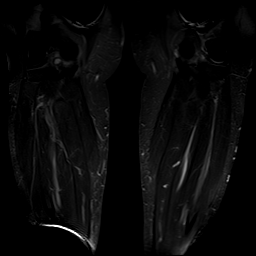
[im 19/25]
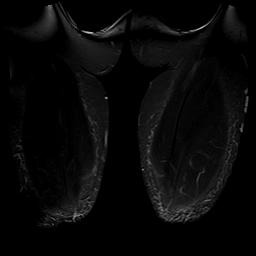
[im 25/25]
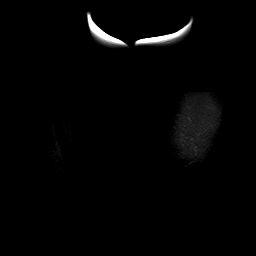

[Series 5: STIR · sagittal · 4.0mm · 0.94mm/px · 5 of 34 slices shown (2 of 2)]
[im 1/34]
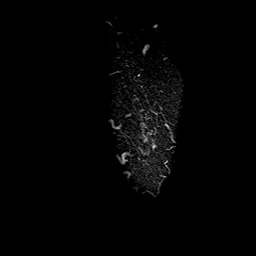
[im 9/34]
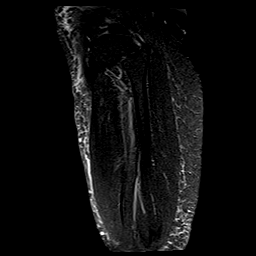
[im 17/34]
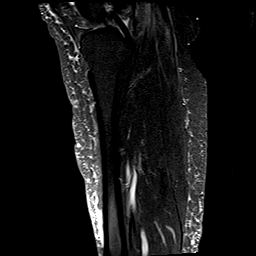
[im 25/34]
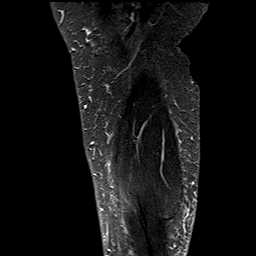
[im 34/34]
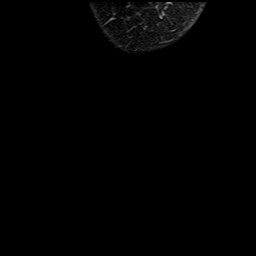

[Series 6: T2 fat-sat · axial · 5.0mm · 0.70mm/px · z∈[-124,+100]mm · 5 of 36 slices shown]
[im 1/36]
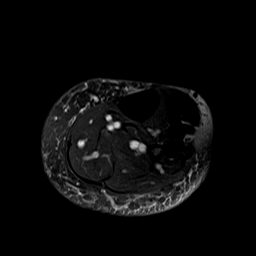
[im 9/36]
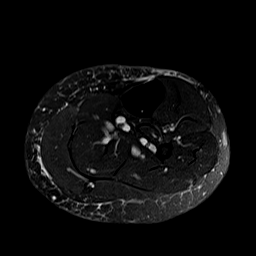
[im 18/36]
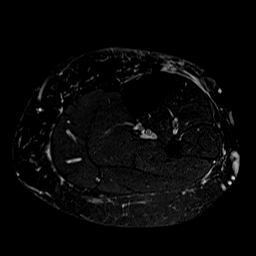
[im 27/36]
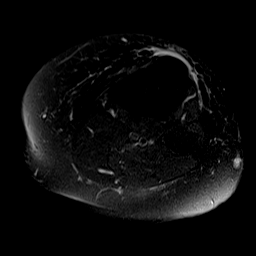
[im 36/36]
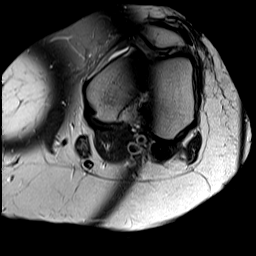

[Series 7: T1 · axial · 5.0mm · 0.70mm/px · z∈[-124,+100]mm · 5 of 36 slices shown (2 of 2)]
[im 1/36]
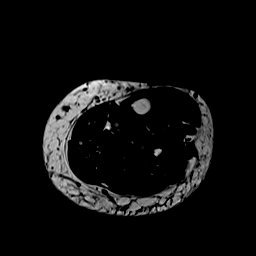
[im 9/36]
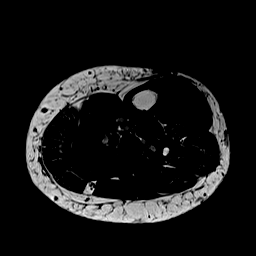
[im 18/36]
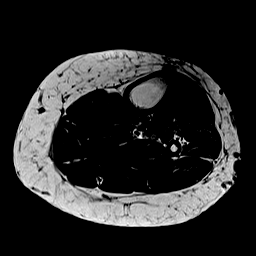
[im 27/36]
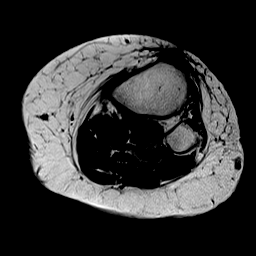
[im 36/36]
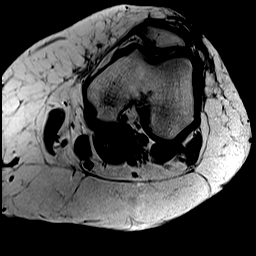

[Series 8: T1 fat-sat · axial · 5.0mm · 0.70mm/px · z∈[-124,+100]mm · 5 of 36 slices shown (1 of 3)]
[im 1/36]
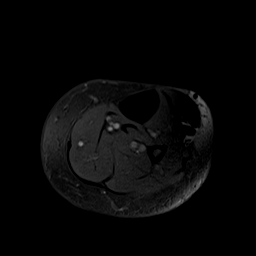
[im 9/36]
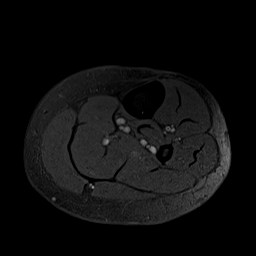
[im 18/36]
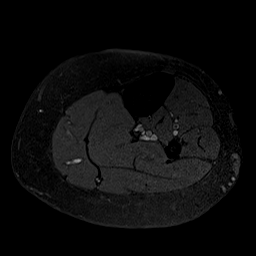
[im 27/36]
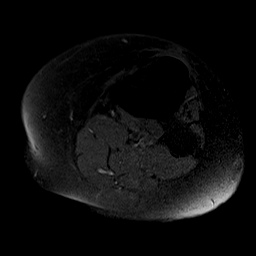
[im 36/36]
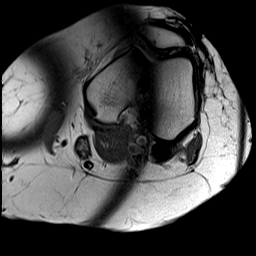

[Series 9: T1 fat-sat · axial · 5.0mm · 0.70mm/px · z∈[-124,+100]mm · 5 of 36 slices shown (2 of 3)]
[im 1/36]
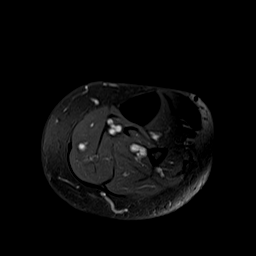
[im 9/36]
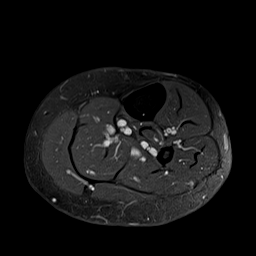
[im 18/36]
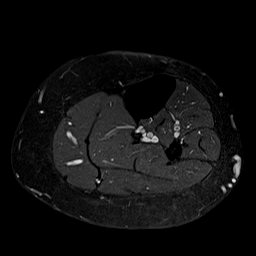
[im 27/36]
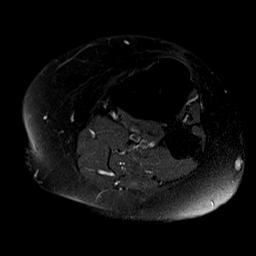
[im 36/36]
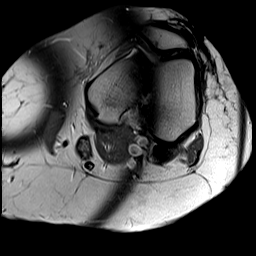

[Series 11: T1 fat-sat · sagittal · 4.0mm · 0.94mm/px · 5 of 34 slices shown (3 of 3)]
[im 1/34]
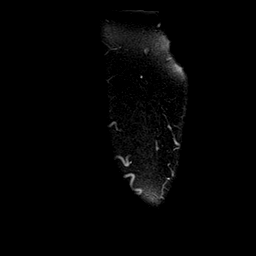
[im 9/34]
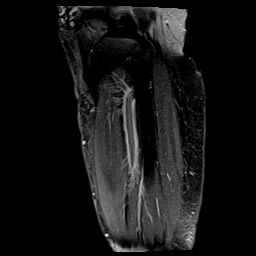
[im 17/34]
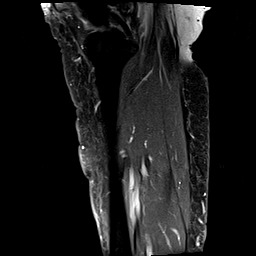
[im 25/34]
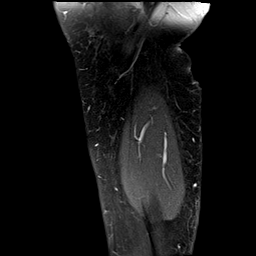
[im 34/34]
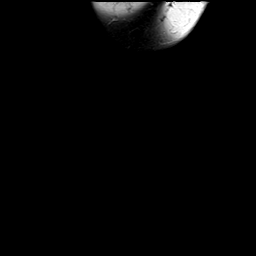

[40 of 40 positions shown; findings below may reference images not displayed]

FINDINGS: Bones/Joint/Cartilage

No acute fracture. No malalignment. Mild medial compartment
osteoarthritis of the left knee. Evaluation of the intra-articular
structures is limited on large field-of-view images not tailored for
evaluation of the joint. No bone marrow edema or periostitis. No
marrow replacing bone lesion.

Ligaments

Intact.

Muscles and Tendons

Normal muscle bulk and signal intensity without edema, atrophy, or
fatty infiltration. No intramuscular mass or fluid collection.
Tendinous structures including the patellar tendon are intact
without evidence of tendinosis or tear.

Soft tissues

There are 2 markers placed on the skin surface demarcating the
superior and inferior borders of patient's site of clinical concern
at the anteromedial aspect of the proximal tibia (see series 7,
images 7 and 27). Mild subcutaneous edema within the subcutaneous
fat at this location, similar in character to the remaining
subcutaneous tissues of the calf. No organized fluid collection. No
solid or cystic mass. No postcontrast enhancement. Nonspecific
ill-defined prepatellar subcutaneous edema.
IMPRESSION: 1. No mass, fluid collection, or other abnormality within the soft
tissues at the anteromedial aspect of the left lower leg to
correspond to site of patient's clinical concern.
2. Mild subcutaneous edema, nonspecific.
3. No acute osseous abnormality.
4. Previously described possible soft tissue mass of the left calf
on prior radiograph corresponds to the gastrocnemius musculature. No
intramuscular mass or other abnormality.

## 2021-04-26 MED ORDER — GADOBUTROL 1 MMOL/ML IV SOLN
10.0000 mL | Freq: Once | INTRAVENOUS | Status: AC | PRN
  Administered 2021-04-26: 10 mL via INTRAVENOUS

## 2021-04-30 ENCOUNTER — Ambulatory Visit: Payer: Medicare PPO | Admitting: Family Medicine

## 2021-04-30 ENCOUNTER — Encounter: Payer: Self-pay | Admitting: Family Medicine

## 2021-04-30 ENCOUNTER — Other Ambulatory Visit: Payer: Self-pay

## 2021-04-30 VITALS — BP 150/74 | HR 74 | Temp 97.0°F | Ht 64.0 in | Wt 274.4 lb

## 2021-04-30 DIAGNOSIS — E079 Disorder of thyroid, unspecified: Secondary | ICD-10-CM | POA: Insufficient documentation

## 2021-04-30 DIAGNOSIS — F258 Other schizoaffective disorders: Secondary | ICD-10-CM | POA: Diagnosis not present

## 2021-04-30 DIAGNOSIS — E041 Nontoxic single thyroid nodule: Secondary | ICD-10-CM | POA: Diagnosis not present

## 2021-04-30 DIAGNOSIS — R7303 Prediabetes: Secondary | ICD-10-CM

## 2021-04-30 MED ORDER — BLOOD GLUCOSE MONITOR KIT: PACK | 0 refills | Status: DC
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
## 2021-05-01 ENCOUNTER — Ambulatory Visit
Admission: RE | Admit: 2021-05-01 | Discharge: 2021-05-01 | Disposition: A | Discharge status: Home / Self Care | Payer: Medicare PPO | Source: Ambulatory Visit | Attending: Family Medicine | Admitting: Family Medicine

## 2021-05-01 DIAGNOSIS — E041 Nontoxic single thyroid nodule: Secondary | ICD-10-CM | POA: Diagnosis not present

## 2021-05-01 IMAGING — US US THYROID
1 series · 13 of 25 positions shown · non-contrast
Comparison: None.

CLINICAL DATA: Right thyroid nodule palpated on physical
examination

EXAM:
THYROID ULTRASOUND
TECHNIQUE: Ultrasound examination of the thyroid gland and adjacent soft
tissues was performed.

[Series 1: us thyroid · 0.06mm/px · 13 of 42 slices shown]
[im 1/42]
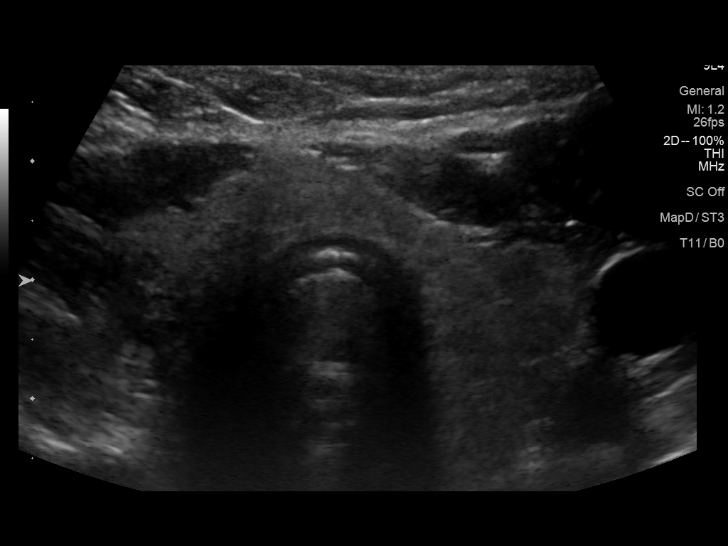
[im 4/42]
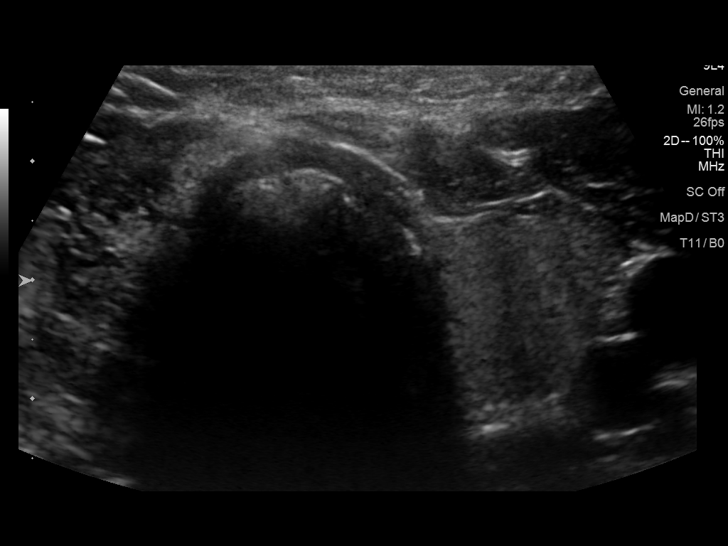
[im 7/42]
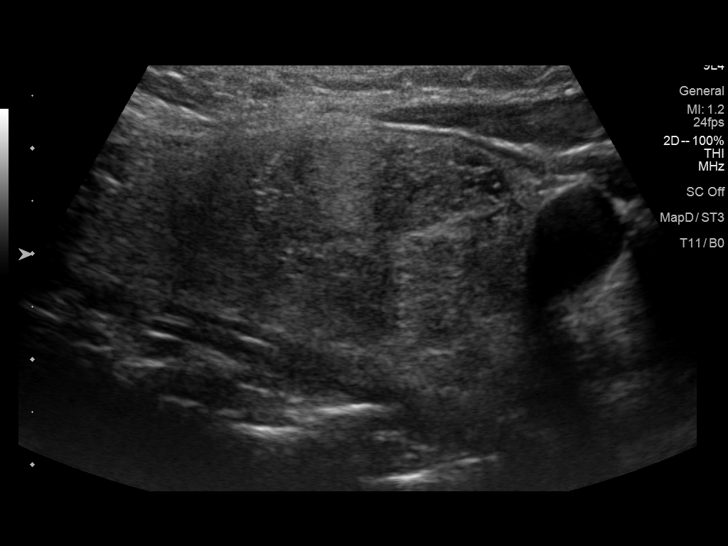
[im 11/42]
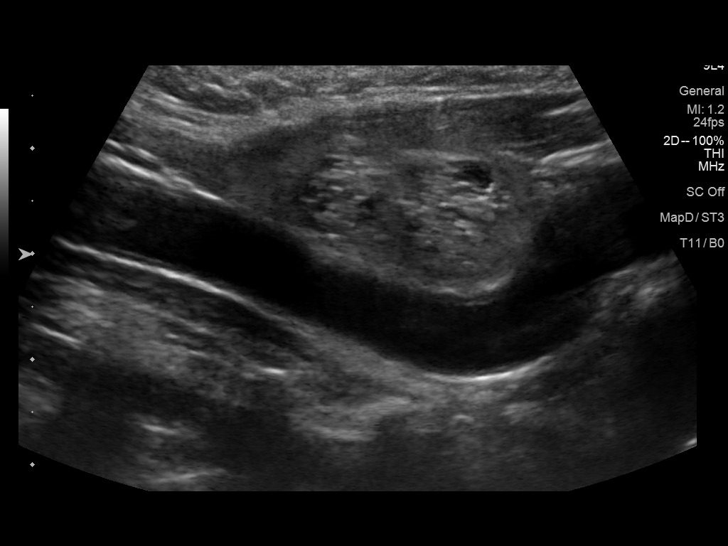
[im 14/42]
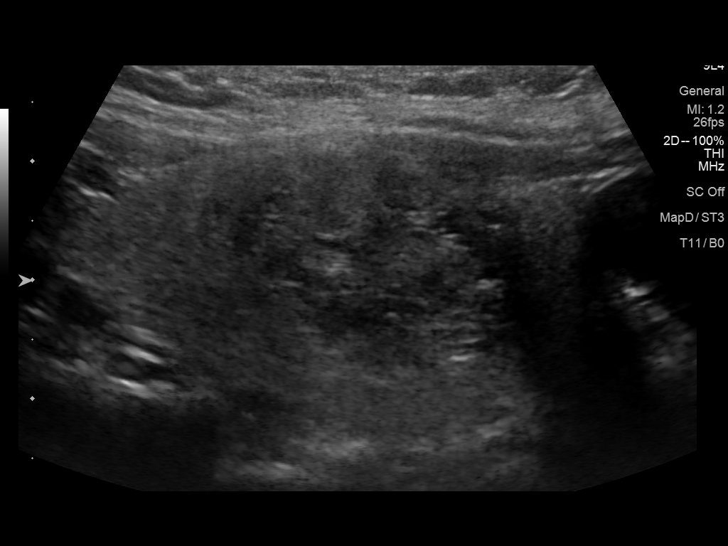
[im 18/42]
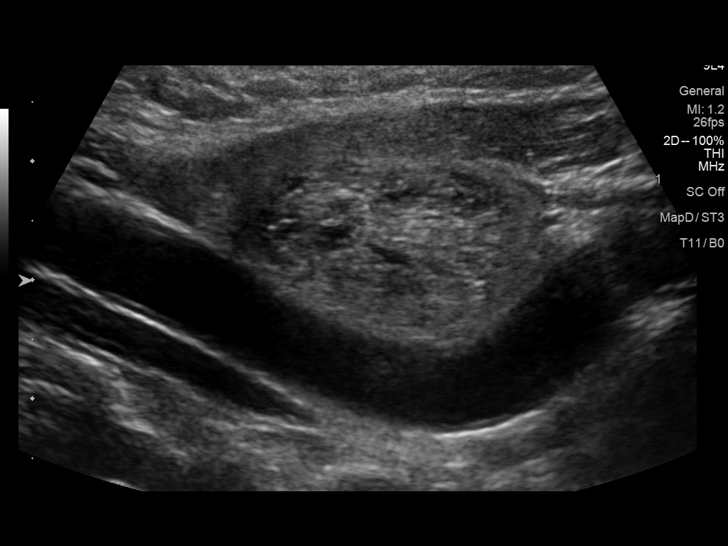
[im 21/42]
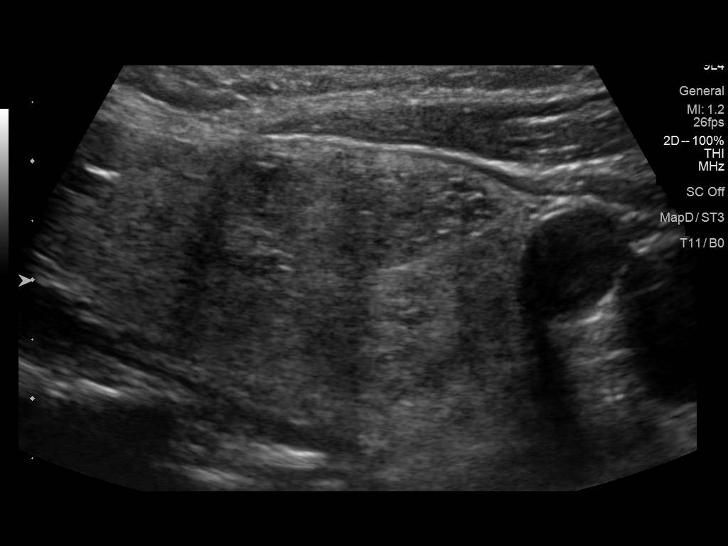
[im 24/42]
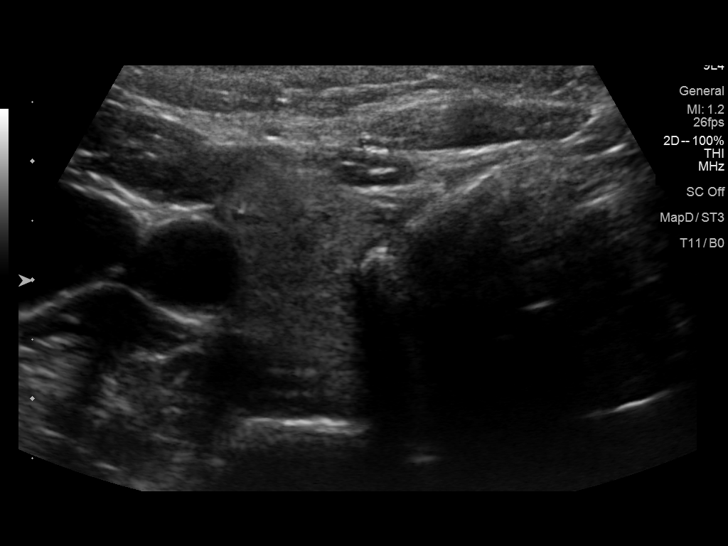
[im 28/42]
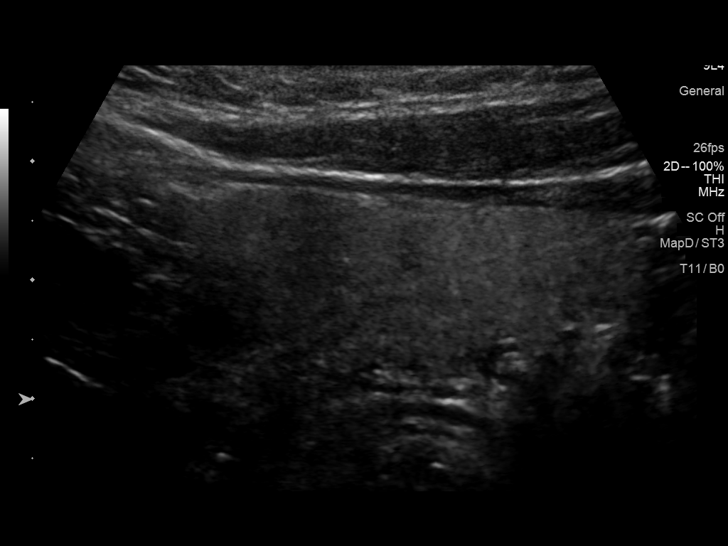
[im 31/42]
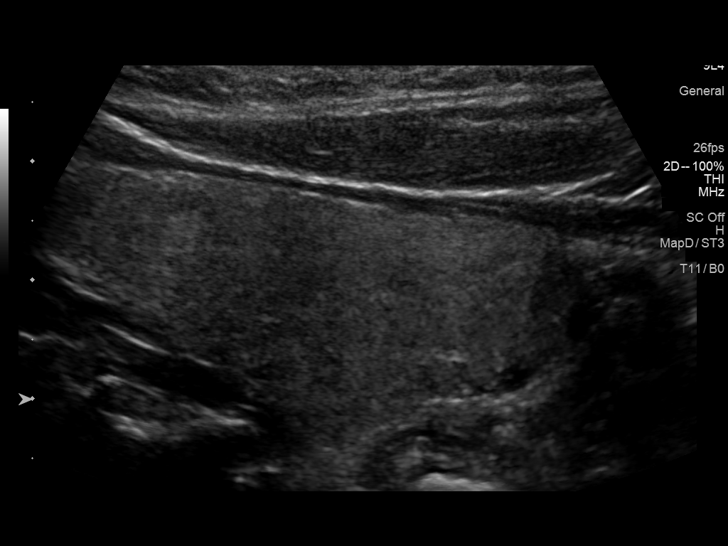
[im 35/42]
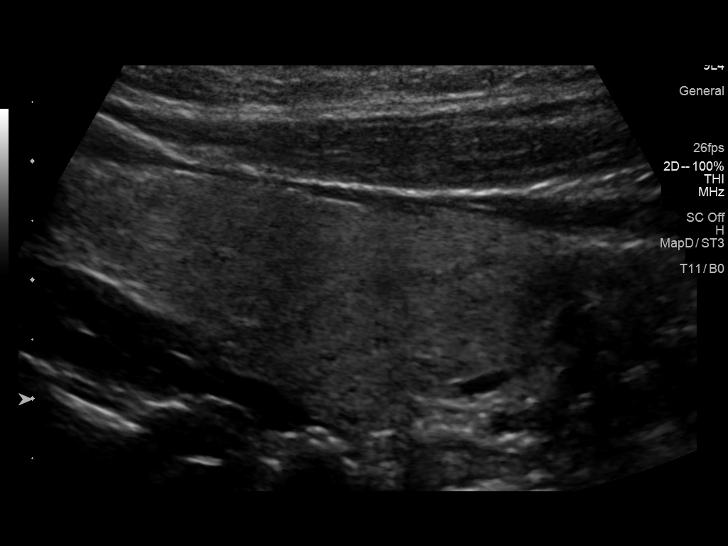
[im 38/42]
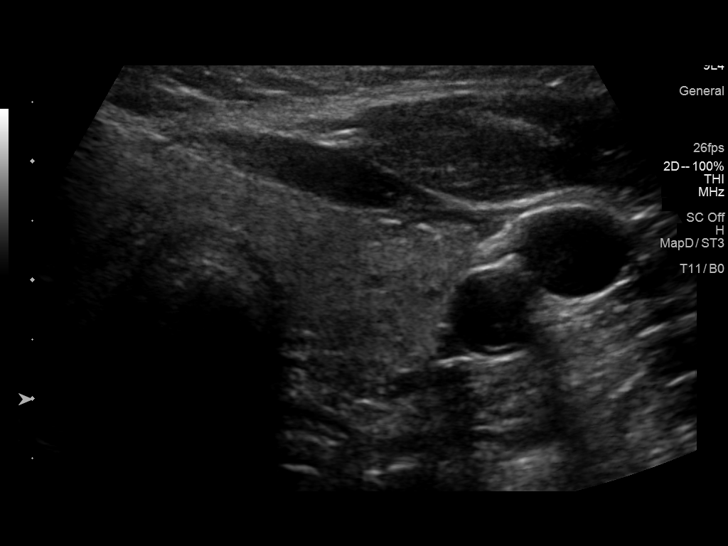
[im 42/42]
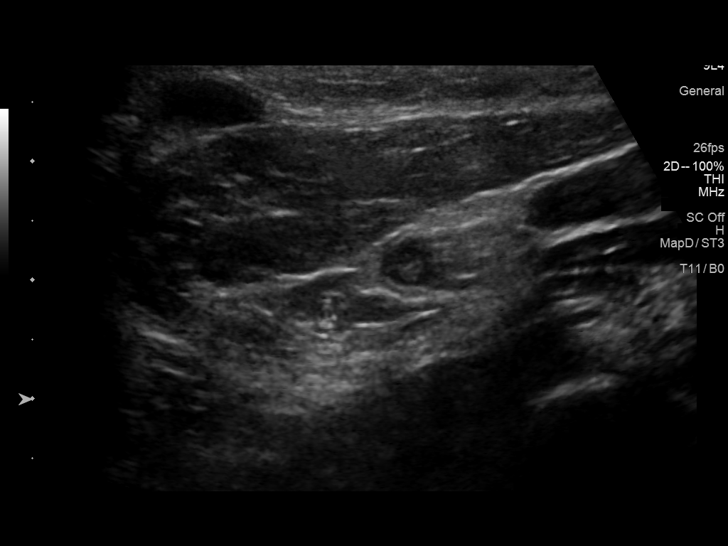

[13 of 25 positions shown; findings below may reference images not displayed]

FINDINGS: Parenchymal Echotexture: Mildly heterogeneous

Isthmus: 0.6 cm

Right lobe: 4.5 x 2.2 x 2.5 cm

Left lobe: 4.7 x 1.9 x 1.7 cm

_________________________________________________________

Estimated total number of nodules >/= 1 cm: 1

Number of spongiform nodules >/=  2 cm not described below (TR1): 0

Number of mixed cystic and solid nodules >/= 1.5 cm not described
below (TR2): 0

_________________________________________________________

Nodule # 1:

Location: Right; mid

Maximum size: 2.8 cm; Other 2 dimensions: 2.5 x 1.9 cm

Composition: solid/almost completely solid (2)

Echogenicity: isoechoic (1)

Shape: not taller-than-wide (0)

Margins: ill-defined (0)

Echogenic foci: none (0)

ACR TI-RADS total points: 3.

ACR TI-RADS risk category: TR3 (3 points).

ACR TI-RADS recommendations:

**Given size (>/= 2.5 cm) and appearance, fine needle aspiration of
this mildly suspicious nodule should be considered based on TI-RADS
criteria.

_________________________________________________________
IMPRESSION: Nodule 1 (TI-RADS 3) located in the mid right thyroid lobe meets
criteria for FNA.

The above is in keeping with the ACR TI-RADS recommendations - [HOSPITAL] [IJ];[DATE].

## 2021-05-02 NOTE — Addendum Note (Signed)
Addended by: Abelino Derrick A on: 05/02/2021 01:00 PM   Modules accepted: Orders

## 2021-05-04 ENCOUNTER — Emergency Department (HOSPITAL_COMMUNITY): Payer: Medicare PPO

## 2021-05-04 ENCOUNTER — Emergency Department (HOSPITAL_COMMUNITY)
Admission: EM | Admit: 2021-05-04 | Discharge: 2021-05-04 | Disposition: A | Discharge status: Home / Self Care | Payer: Medicare PPO | Attending: Emergency Medicine | Admitting: Emergency Medicine

## 2021-05-04 ENCOUNTER — Encounter (HOSPITAL_COMMUNITY): Payer: Self-pay | Admitting: Emergency Medicine

## 2021-05-04 ENCOUNTER — Other Ambulatory Visit: Payer: Self-pay

## 2021-05-04 DIAGNOSIS — R059 Cough, unspecified: Secondary | ICD-10-CM | POA: Diagnosis not present

## 2021-05-04 DIAGNOSIS — Z7984 Long term (current) use of oral hypoglycemic drugs: Secondary | ICD-10-CM | POA: Insufficient documentation

## 2021-05-04 DIAGNOSIS — I10 Essential (primary) hypertension: Secondary | ICD-10-CM | POA: Insufficient documentation

## 2021-05-04 DIAGNOSIS — Z7952 Long term (current) use of systemic steroids: Secondary | ICD-10-CM | POA: Diagnosis not present

## 2021-05-04 DIAGNOSIS — R0602 Shortness of breath: Secondary | ICD-10-CM | POA: Insufficient documentation

## 2021-05-04 DIAGNOSIS — Z79899 Other long term (current) drug therapy: Secondary | ICD-10-CM | POA: Diagnosis not present

## 2021-05-04 DIAGNOSIS — Z20822 Contact with and (suspected) exposure to covid-19: Secondary | ICD-10-CM | POA: Insufficient documentation

## 2021-05-04 DIAGNOSIS — E119 Type 2 diabetes mellitus without complications: Secondary | ICD-10-CM | POA: Diagnosis not present

## 2021-05-04 DIAGNOSIS — R0689 Other abnormalities of breathing: Secondary | ICD-10-CM | POA: Diagnosis not present

## 2021-05-04 DIAGNOSIS — R064 Hyperventilation: Secondary | ICD-10-CM | POA: Diagnosis not present

## 2021-05-04 DIAGNOSIS — J45909 Unspecified asthma, uncomplicated: Secondary | ICD-10-CM | POA: Insufficient documentation

## 2021-05-04 DIAGNOSIS — R051 Acute cough: Secondary | ICD-10-CM

## 2021-05-04 LAB — TROPONIN I (HIGH SENSITIVITY)
Troponin I (High Sensitivity): 7 ng/L (ref <18)
Troponin I (High Sensitivity): 5 ng/L (ref <18)

## 2021-05-04 LAB — COMPREHENSIVE METABOLIC PANEL
ALT: 18 U/L (ref 0–44)
AST: 18 U/L (ref 15–41)
Albumin: 3.1 g/dL — ABNORMAL LOW (ref 3.5–5.0)
Alkaline Phosphatase: 89 U/L (ref 38–126)
Anion gap: 5 (ref 5–15)
BUN: 10 mg/dL (ref 8–23)
CO2: 27 mmol/L (ref 22–32)
Calcium: 8.5 mg/dL — ABNORMAL LOW (ref 8.9–10.3)
Chloride: 106 mmol/L (ref 98–111)
Creatinine, Ser: 0.83 mg/dL (ref 0.44–1.00)
GFR, Estimated: >60 mL/min (ref >60)
Glucose, Bld: 133 mg/dL — ABNORMAL HIGH (ref 70–99)
Potassium: 3.8 mmol/L (ref 3.5–5.1)
Sodium: 138 mmol/L (ref 135–145)
Total Bilirubin: 0.7 mg/dL (ref 0.3–1.2)
Total Protein: 6.7 g/dL (ref 6.5–8.1)

## 2021-05-04 LAB — CBC
HCT: 38.5 % (ref 36.0–46.0)
Hemoglobin: 12.5 g/dL (ref 12.0–15.0)
MCH: 24.8 pg — ABNORMAL LOW (ref 26.0–34.0)
MCHC: 32.5 g/dL (ref 30.0–36.0)
MCV: 76.2 fL — ABNORMAL LOW (ref 80.0–100.0)
Platelets: 269 10*3/uL (ref 150–400)
RBC: 5.05 MIL/uL (ref 3.87–5.11)
RDW: 14.9 % (ref 11.5–15.5)
WBC: 10.4 10*3/uL (ref 4.0–10.5)
nRBC: 0 % (ref 0.0–0.2)

## 2021-05-04 LAB — RESP PANEL BY RT-PCR (FLU A&B, COVID) ARPGX2
Influenza A by PCR: NEGATIVE
Influenza B by PCR: NEGATIVE
SARS Coronavirus 2 by RT PCR: NEGATIVE

## 2021-05-04 IMAGING — CR DG CHEST 2V
2 series · 2 of 2 positions shown · non-contrast
Comparison: Chest radiograph [DATE]

CLINICAL DATA: Labored breathing.

EXAM:
CHEST - 2 VIEW

[chest pa]
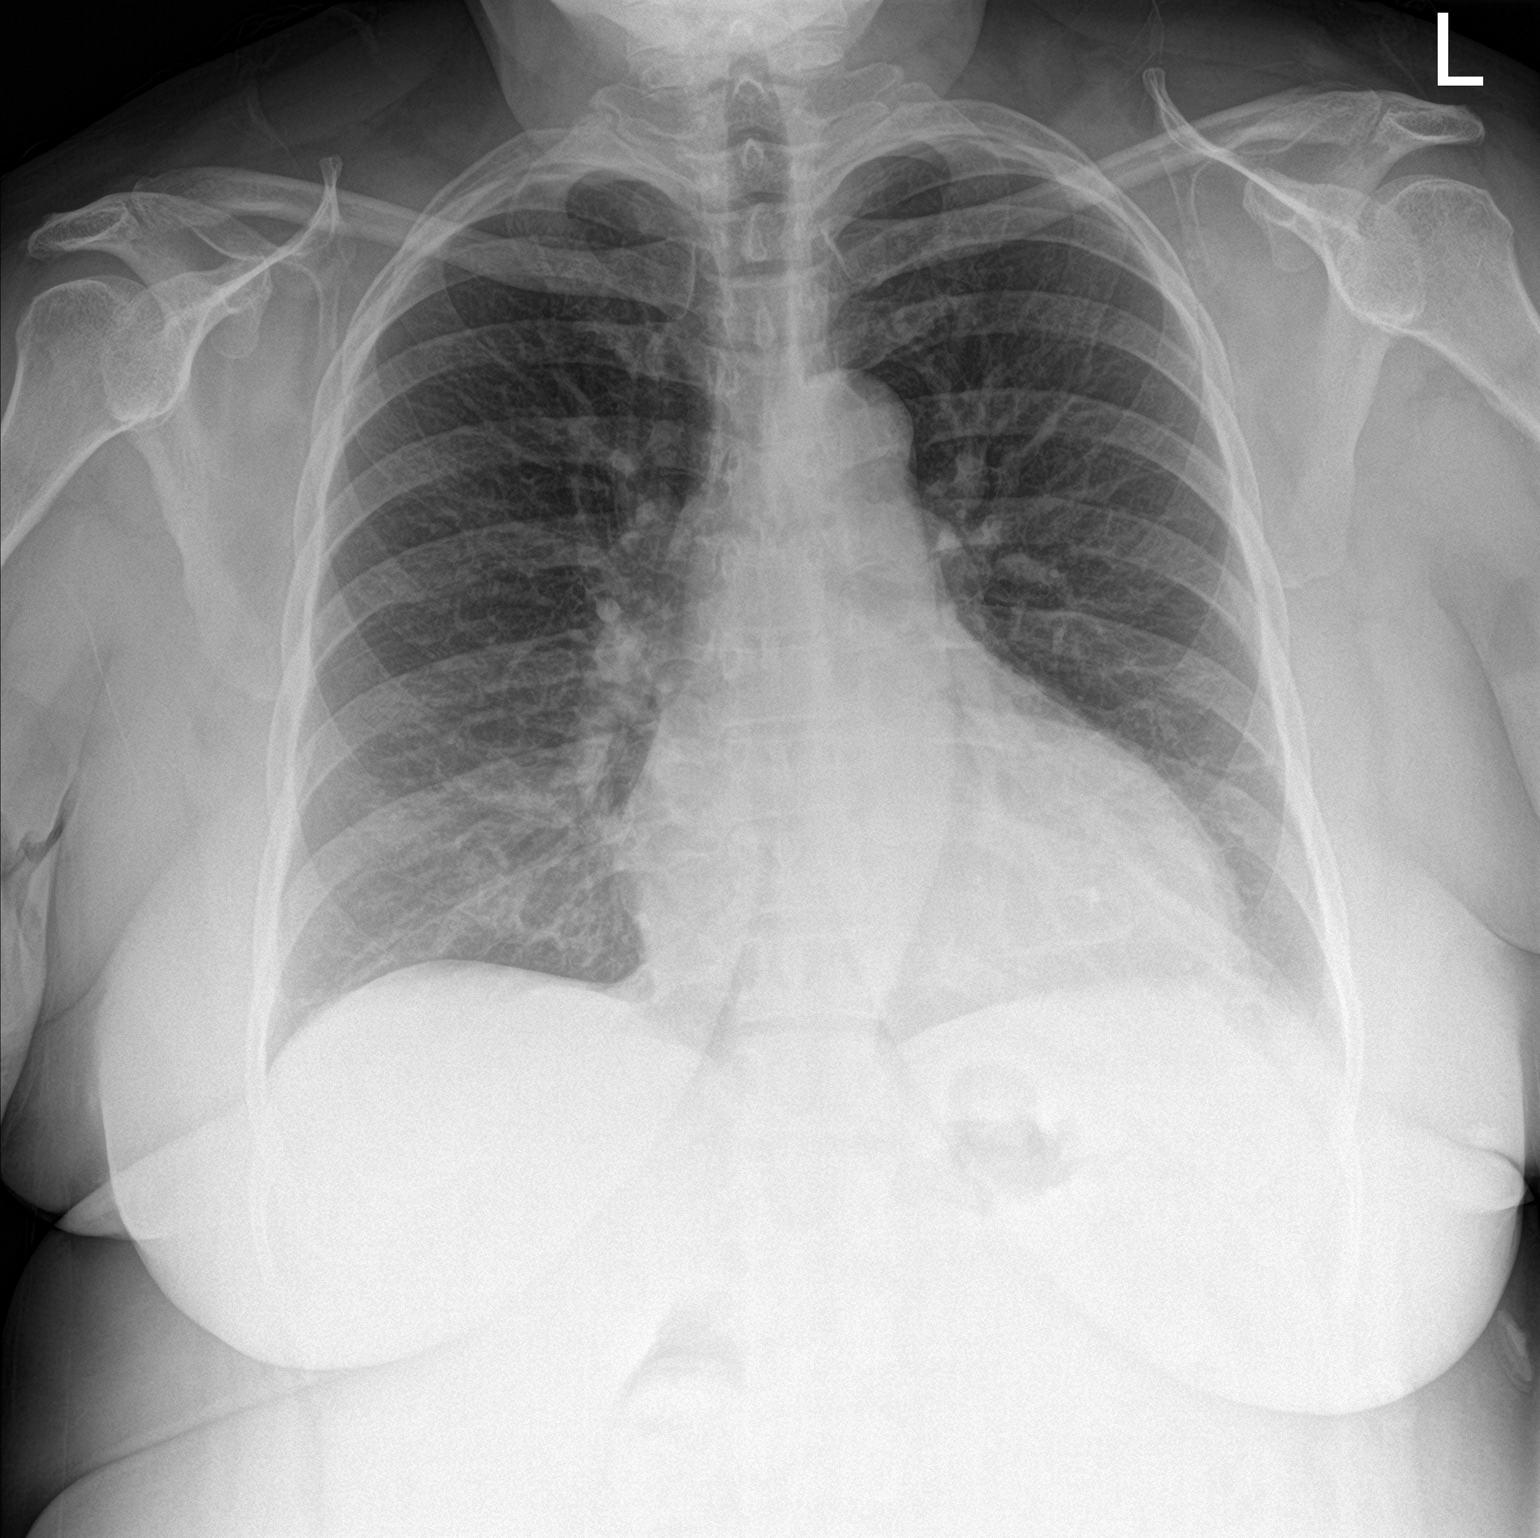

[chest lat]
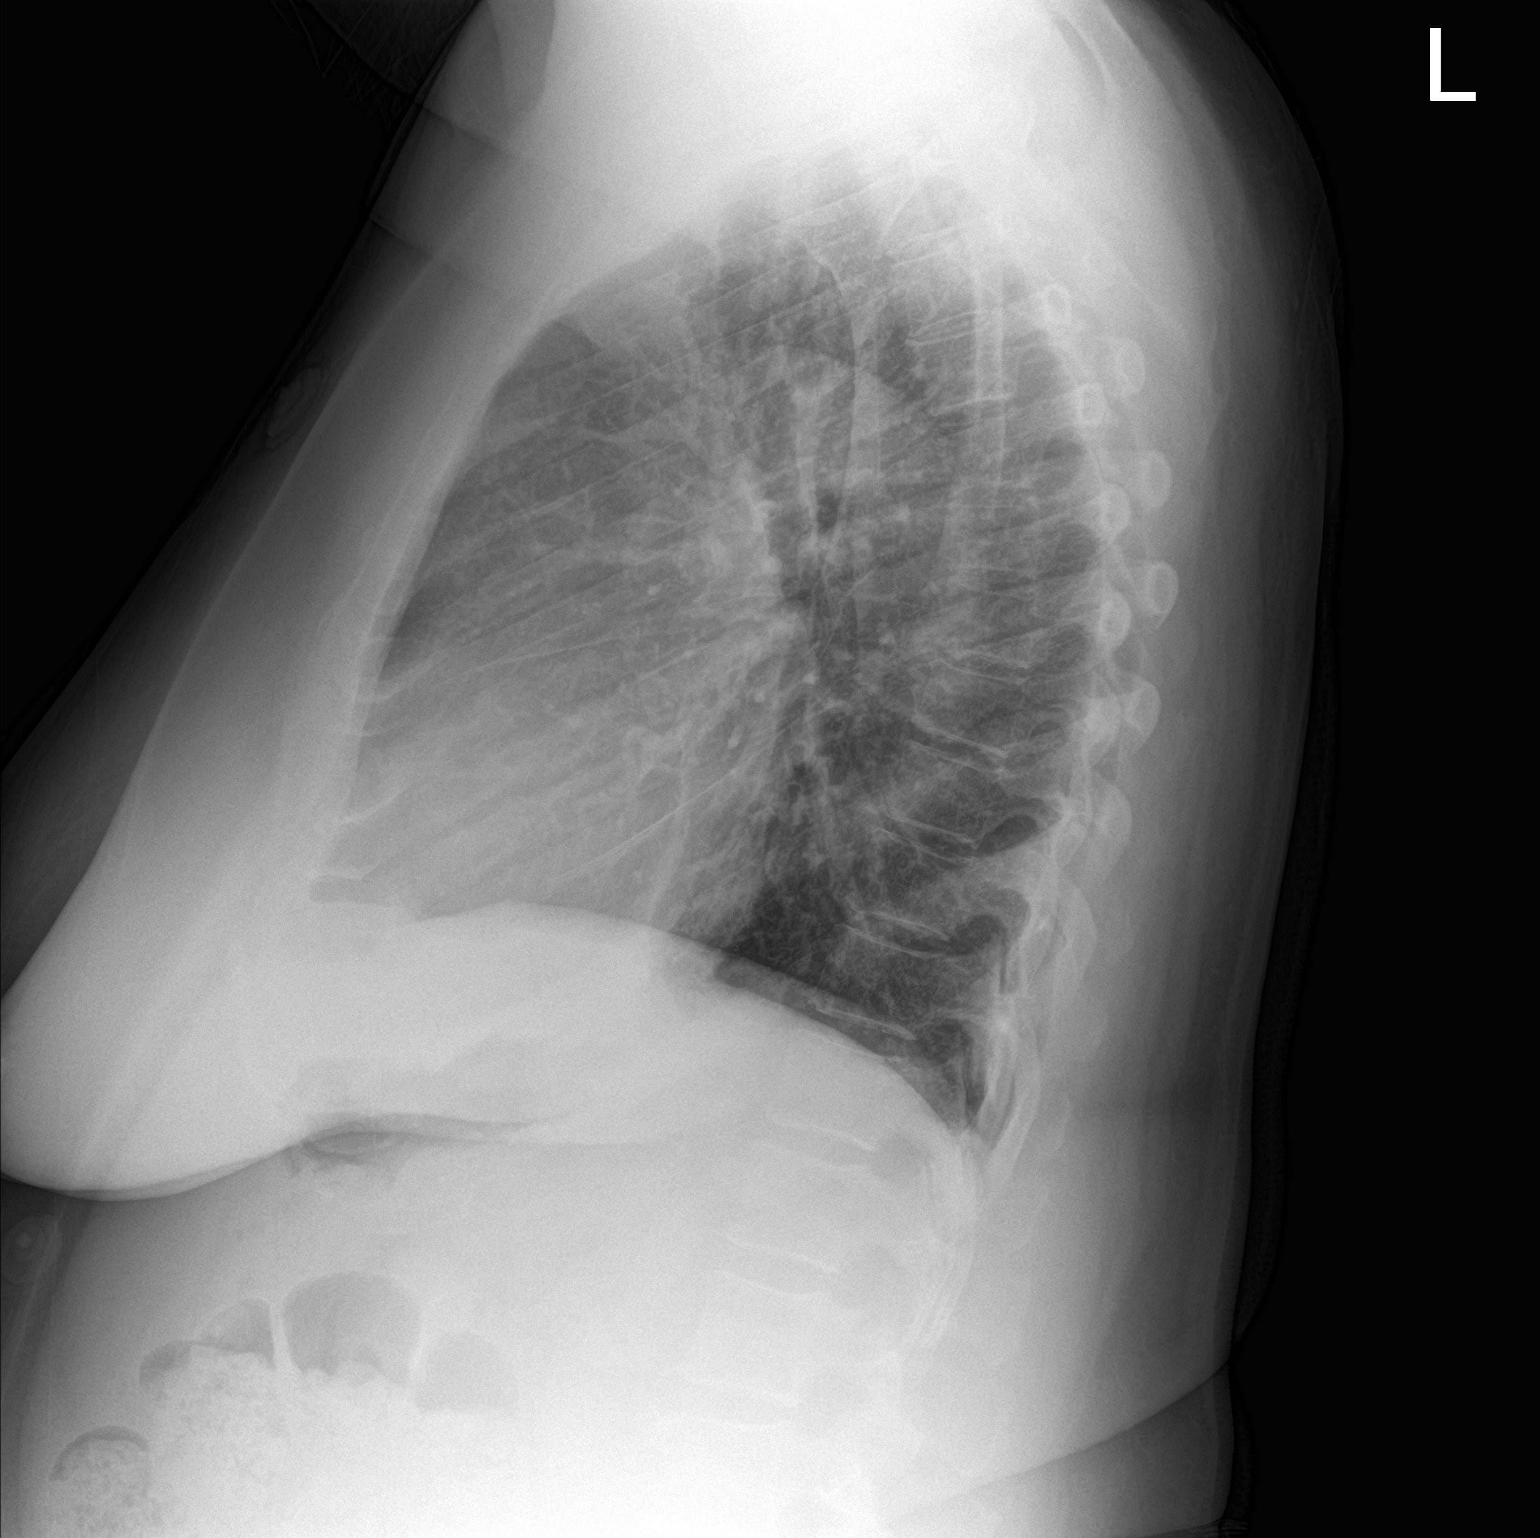

[2 of 2 positions shown; findings below may reference images not displayed]

FINDINGS: The heart size and mediastinal contours are within normal limits.
Both lungs are clear. The visualized skeletal structures are
unremarkable.
IMPRESSION: No active cardiopulmonary disease.

## 2021-05-04 NOTE — Discharge Instructions (Addendum)
No acute cause of your coughing or shortness of breath were found here in the ED.  Your vital signs appear normal and your oxygen levels have been 96 to 100% on room air which is normal. Please return if you are feeling worse at any time. Please call your primary care doctor tomorrow for recheck this week
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
## 2021-05-13 ENCOUNTER — Other Ambulatory Visit: Payer: Self-pay

## 2021-05-13 ENCOUNTER — Encounter: Payer: Self-pay | Admitting: Family Medicine

## 2021-05-13 ENCOUNTER — Ambulatory Visit: Payer: Medicare PPO | Admitting: Family Medicine

## 2021-05-13 VITALS — BP 156/78 | HR 67 | Temp 97.8°F | Ht 64.0 in | Wt 268.4 lb

## 2021-05-13 DIAGNOSIS — F258 Other schizoaffective disorders: Secondary | ICD-10-CM | POA: Diagnosis not present

## 2021-05-13 DIAGNOSIS — I10 Essential (primary) hypertension: Secondary | ICD-10-CM

## 2021-05-13 DIAGNOSIS — E041 Nontoxic single thyroid nodule: Secondary | ICD-10-CM

## 2021-05-13 DIAGNOSIS — R0982 Postnasal drip: Secondary | ICD-10-CM | POA: Diagnosis not present

## 2021-05-13 MED ORDER — FLUTICASONE PROPIONATE 50 MCG/ACT NA SUSP: 2.0000 | Freq: Every day | NASAL | 6 refills | Status: DC

## 2021-05-13 MED ORDER — LOSARTAN POTASSIUM 100 MG PO TABS: 100.0000 mg | ORAL_TABLET | Freq: Every day | ORAL | 1 refills | Status: DC

## 2021-05-13 NOTE — Progress Notes (Signed)
Established Patient Office Visit  Subjective:  Patient ID: Bianca Medina, female    DOB: [birth year]  Age: 70 y.o. MRN: [id number]  CC:  Chief Complaint  Patient presents with   Hospitalization Follow-up    Hospital follow seen for SOB and chest congestion, would like to discuss hair loss and MRI.     HPI [name] presents for for follow-up of dry cough with ongoing postnasal drip and nasal congestion.  There is been no fever or chills.  She has experienced wheezing.  She is compliant with her Symbicort.  She uses a ProAir on an as-needed basis.  Biopsy of thyroid lesion has been requested.  Psychiatry referral has been requested.  She is reaching out to therapist she has worked with in the past.  Chest x-ray obtained at a recent emergency room visit was clear.  Past Medical History:  Diagnosis Date   Anemia 2003   IRON DEFICIENCY    Asthma    Common migraine 06/23/2019   Diabetes mellitus without complication (HCC)    GERD (gastroesophageal reflux disease)    Hyperlipidemia, mild    Hypertension    NSVD (normal spontaneous vaginal delivery)    X3   Obesity    PMDD (premenstrual dysphoric disorder)    Sleep disorder     Past Surgical History:  Procedure Laterality Date   ABDOMINAL HYSTERECTOMY     GUM SURGERY  1980's   TUBAL LIGATION      Family History  Problem Relation Age of Onset   Cancer Mother        LUNG- SMOKER   Diabetes Father    Hypertension Father    Heart disease Father    Stroke Father    Cancer Paternal Aunt        COLON   Cancer Paternal Uncle        COLON   Breast cancer Sister     Social History   Socioeconomic History   Marital status: Single    Spouse name: Not on file   Number of children: 3   Years of education: college   Highest education level: Not on file  Occupational History   Occupation: Architectural technologist: [employer]  Tobacco Use   Smoking status: Never   Smokeless  tobacco: Never  Vaping Use   Vaping Use: Never used  Substance and Sexual Activity   Alcohol use: No   Drug use: No   Sexual activity: Not Currently    Birth control/protection: Surgical    Comment: HYSTERECTOMY-1st intercourse 89 yo-5 partners  Other Topics Concern   Not on file  Social History Narrative   Right handed     Caffeine use: has a drink every other day   Social Determinants of Health   Financial Resource Strain: Not on file  Food Insecurity: Not on file  Transportation Needs: Not on file  Physical Activity: Not on file  Stress: Not on file  Social Connections: Not on file  Intimate Partner Violence: Not on file    Outpatient Medications Prior to Visit  Medication Sig Dispense Refill   Bioflavonoid Products (BIOFLEX PO) Take 1 tablet by mouth daily.     Biotin 5000 MCG CAPS Take 5,000 mcg by mouth daily.      blood glucose meter kit and supplies KIT Dispense based on patient and insurance preference. Use up to 3 times a day as indicated. 1 each 0   esomeprazole (NEXIUM)  40 MG capsule Take 40 mg by mouth daily.      Loratadine 10 MG CHEW      metFORMIN (GLUCOPHAGE XR) 500 MG 24 hr tablet Take 1 tablet (500 mg total) by mouth daily with breakfast. 90 tablet 1   montelukast (SINGULAIR) 10 MG tablet Take 10 mg by mouth daily.     PROAIR HFA 108 (90 Base) MCG/ACT inhaler Inhale 1-2 puffs into the lungs every 4 (four) hours as needed for wheezing or shortness of breath.      SYMBICORT 80-4.5 MCG/ACT inhaler Inhale into the lungs.     triamcinolone cream (KENALOG) 0.1 % Apply 1 application topically 2 (two) times daily. Use the smallest amount possible for no more than 7 days 15 g 0   Vitamin D, Ergocalciferol, (DRISDOL) 1.25 MG (50000 UNIT) CAPS capsule Take 1 capsule (50,000 Units total) by mouth every 7 (seven) days. 15 capsule 1   fluticasone (FLONASE) 50 MCG/ACT nasal spray Place 1 spray into both nostrils daily.     losartan (COZAAR) 50 MG tablet Take 1 tablet (50  mg total) by mouth daily. 90 tablet 1   ibuprofen (ADVIL) 600 MG tablet  (Patient not taking: Reported on 05/13/2021)     No facility-administered medications prior to visit.    Allergies  Allergen Reactions   Codeine Other (See Comments)    nightmares Other reaction(s): Other nightmares nightmares    ROS Review of Systems  Constitutional:  Negative for chills, diaphoresis, fatigue, fever and unexpected weight change.  HENT:  Positive for congestion and postnasal drip. Negative for rhinorrhea, sinus pressure and sinus pain.   Respiratory:  Positive for cough and wheezing.   Cardiovascular: Negative.   Gastrointestinal: Negative.   Genitourinary: Negative.   Musculoskeletal:  Negative for gait problem and joint swelling.  Neurological:  Negative for speech difficulty, weakness and headaches.     Objective:    Physical Exam Vitals and nursing note reviewed.  Constitutional:      General: She is not in acute distress.    Appearance: Normal appearance. She is not ill-appearing, toxic-appearing or diaphoretic.  HENT:     Head: Normocephalic and atraumatic.     Right Ear: Tympanic membrane, ear canal and external ear normal.     Left Ear: Tympanic membrane, ear canal and external ear normal.     Mouth/Throat:     Mouth: Mucous membranes are moist.     Pharynx: Oropharynx is clear. No oropharyngeal exudate or posterior oropharyngeal erythema.  Eyes:     General:        Right eye: No discharge.        Left eye: No discharge.     Extraocular Movements: Extraocular movements intact.     Conjunctiva/sclera: Conjunctivae normal.     Pupils: Pupils are equal, round, and reactive to light.  Cardiovascular:     Rate and Rhythm: Normal rate and regular rhythm.  Pulmonary:     Effort: Pulmonary effort is normal.     Breath sounds: Normal breath sounds.  Musculoskeletal:     Cervical back: No rigidity or tenderness.  Lymphadenopathy:     Cervical: No cervical adenopathy.   Skin:    General: Skin is warm and dry.  Neurological:     Mental Status: She is alert and oriented to person, place, and time.  Psychiatric:        Mood and Affect: Mood normal.        Behavior: Behavior normal.  BP (!) 156/78 (BP Location: Right Arm, Patient Position: Sitting, Cuff Size: Large)    Pulse 67    Temp 97.8 F (36.6 C) (Temporal)    Ht '5\' 4"'  (1.626 m)    Wt 268 lb 6.4 oz (121.7 kg)    SpO2 94%    BMI 46.07 kg/m  Wt Readings from Last 3 Encounters:  05/13/21 268 lb 6.4 oz (121.7 kg)  05/04/21 260 lb (117.9 kg)  04/30/21 274 lb 6.4 oz (124.5 kg)     Health Maintenance Due  Topic Date Due   Zoster Vaccines- Shingrix (1 of 2) Never done   Pneumonia Vaccine 23+ Years old (2 - PPSV23 if available, else PCV20) 04/25/2013    There are no preventive care reminders to display for this patient.  Lab Results  Component Value Date   TSH 0.79 04/22/2012   Lab Results  Component Value Date   WBC 10.4 05/04/2021   HGB 12.5 05/04/2021   HCT 38.5 05/04/2021   MCV 76.2 (L) 05/04/2021   PLT 269 05/04/2021   Lab Results  Component Value Date   NA 138 05/04/2021   K 3.8 05/04/2021   CO2 27 05/04/2021   GLUCOSE 133 (H) 05/04/2021   BUN 10 05/04/2021   CREATININE 0.83 05/04/2021   BILITOT 0.7 05/04/2021   ALKPHOS 89 05/04/2021   AST 18 05/04/2021   ALT 18 05/04/2021   PROT 6.7 05/04/2021   ALBUMIN 3.1 (L) 05/04/2021   CALCIUM 8.5 (L) 05/04/2021   ANIONGAP 5 05/04/2021   GFR 78.99 03/19/2021   Lab Results  Component Value Date   CHOL 163 03/19/2021   Lab Results  Component Value Date   HDL 42.70 03/19/2021   Lab Results  Component Value Date   LDLCALC 105 (H) 03/19/2021   Lab Results  Component Value Date   TRIG 75.0 03/19/2021   Lab Results  Component Value Date   CHOLHDL 4 03/19/2021   Lab Results  Component Value Date   HGBA1C 6.9 (H) 03/19/2021      Assessment & Plan:   Problem List Items Addressed This Visit       Cardiovascular  and Mediastinum   Essential hypertension   Relevant Medications   losartan (COZAAR) 100 MG tablet     Endocrine   Thyroid nodule - Primary     Other   Other schizoaffective disorders (HCC)   Other Visit Diagnoses     Post-nasal drip       Relevant Medications   fluticasone (FLONASE) 50 MCG/ACT nasal spray       Meds ordered this encounter  Medications   fluticasone (FLONASE) 50 MCG/ACT nasal spray    Sig: Place 2 sprays into both nostrils daily.    Dispense:  16 g    Refill:  6   losartan (COZAAR) 100 MG tablet    Sig: Take 1 tablet (100 mg total) by mouth daily.    Dispense:  90 tablet    Refill:  1    Follow-up: Return in about 6 weeks (around 06/24/2021), or Use flonase daily. Consder pulmonary referral next visit for asthma..  Have increased Cozaar to 100 mg daily.  Urged patient to use the Flonase nasal spray daily.  This should help her postnasal drip and cough.  Encouraged ongoing use of Symbicort.  Consider pulmonary consultation if not improving.  Libby Maw, MD

## 2021-05-14 DIAGNOSIS — M170 Bilateral primary osteoarthritis of knee: Secondary | ICD-10-CM | POA: Diagnosis not present

## 2021-05-21 ENCOUNTER — Other Ambulatory Visit (HOSPITAL_COMMUNITY): Payer: Self-pay | Admitting: Orthopedic Surgery

## 2021-05-21 ENCOUNTER — Ambulatory Visit (HOSPITAL_COMMUNITY)
Admission: RE | Admit: 2021-05-21 | Discharge: 2021-05-21 | Disposition: A | Discharge status: Home / Self Care | Payer: Medicare PPO | Source: Ambulatory Visit | Attending: Orthopedic Surgery | Admitting: Orthopedic Surgery

## 2021-05-21 ENCOUNTER — Other Ambulatory Visit: Payer: Self-pay

## 2021-05-21 DIAGNOSIS — M7989 Other specified soft tissue disorders: Secondary | ICD-10-CM

## 2021-05-21 DIAGNOSIS — M79605 Pain in left leg: Secondary | ICD-10-CM

## 2021-05-21 DIAGNOSIS — M5416 Radiculopathy, lumbar region: Secondary | ICD-10-CM | POA: Diagnosis not present

## 2021-05-29 DIAGNOSIS — I87393 Chronic venous hypertension (idiopathic) with other complications of bilateral lower extremity: Secondary | ICD-10-CM | POA: Diagnosis not present

## 2021-05-29 DIAGNOSIS — I872 Venous insufficiency (chronic) (peripheral): Secondary | ICD-10-CM | POA: Diagnosis not present

## 2021-05-30 ENCOUNTER — Telehealth: Payer: Self-pay | Admitting: Gastroenterology

## 2021-05-30 NOTE — Telephone Encounter (Signed)
Hi Dr. Havery Moros, this pt came to the office today with her GI records from Dr. Collene Mares. She stated that she is due for a colonoscopy. She experiences ocassional rectal pain. She saw a GI at [facility] last August 2022, records are in [place]. She would like to become your patient because she has heard good things about you and she lives now in [place]. Records from Dr. Collene Mares will be sent to you for review. Thank you

## 2021-05-30 NOTE — Telephone Encounter (Signed)
That's okay, I'm happy to see her in the office

## 2021-06-02 ENCOUNTER — Telehealth: Payer: Self-pay | Admitting: Family Medicine

## 2021-06-02 NOTE — Telephone Encounter (Signed)
Spoke with patient who states that she would like to know if Dr. Ethelene Hal have any recommendations for any internist in the area patient feels like she would like to see someone to evaluate her complex chronic conditions. Please advise.

## 2021-06-02 NOTE — Telephone Encounter (Signed)
Attempted to contact pt but vm was full. Will try again another day.

## 2021-06-02 NOTE — Telephone Encounter (Signed)
Pt is wanting a name of an Internist doctor. Please advise at [phone].

## 2021-06-04 NOTE — Telephone Encounter (Signed)
Called patient to inform of message below, no answer unable to leave message will call back.

## 2021-06-05 NOTE — Telephone Encounter (Signed)
Patient aware that Dr. Ethelene Hal does not know of any internist in the area.

## 2021-06-06 DIAGNOSIS — M5136 Other intervertebral disc degeneration, lumbar region: Secondary | ICD-10-CM | POA: Diagnosis not present

## 2021-06-06 DIAGNOSIS — M5416 Radiculopathy, lumbar region: Secondary | ICD-10-CM | POA: Diagnosis not present

## 2021-06-06 DIAGNOSIS — M6281 Muscle weakness (generalized): Secondary | ICD-10-CM | POA: Diagnosis not present

## 2021-06-06 NOTE — Telephone Encounter (Signed)
LVM for patient to call back to schedule OV.  

## 2021-06-09 ENCOUNTER — Encounter: Payer: Self-pay | Admitting: Gastroenterology

## 2021-06-09 NOTE — Telephone Encounter (Signed)
Consult scheduled on 07/16/2021 at 9:50am.

## 2021-06-10 ENCOUNTER — Telehealth: Payer: Self-pay | Admitting: Neurology

## 2021-06-10 NOTE — Telephone Encounter (Signed)
Pt said, not really a headache believe to be something else. Informed pt would need to see PCP for a referral to be sent over. Pt said will be seeing PCP tomorrow. Pt verbalized understand need a referral for a new symptom.

## 2021-06-10 NOTE — Telephone Encounter (Signed)
Pt called in requesting an appt for worsening headaches and the feeling of "shooting bombs in head" since this morning. Pt has seen Dr. Jannifer Franklin for migraines and Dr. Brett Fairy for sleep in the past. She wants to know if we are able to work her in soon with someone or if she should seek more urgent care. Best call back # is [phone].

## 2021-06-10 NOTE — Telephone Encounter (Signed)
I called pt.  LMVM for her to seek care via ED/urgent care for her headache "shooting bombs in her head since this am).  We would not be able to see her acutely.  She will need to call back and make a future appt with Dr. Jaynee Eagles or other provider for her migraines.

## 2021-06-10 NOTE — Telephone Encounter (Signed)
noted 

## 2021-06-10 NOTE — Telephone Encounter (Signed)
Per Dr. Jannifer Franklin' last note, this was going to be TOC to Dr. Jaynee Eagles

## 2021-06-11 ENCOUNTER — Other Ambulatory Visit: Payer: Self-pay

## 2021-06-11 ENCOUNTER — Ambulatory Visit: Payer: Medicare PPO | Admitting: Family Medicine

## 2021-06-11 ENCOUNTER — Encounter: Payer: Self-pay | Admitting: Family Medicine

## 2021-06-11 VITALS — BP 126/74 | HR 79 | Temp 97.3°F | Ht 64.0 in | Wt 260.0 lb

## 2021-06-11 DIAGNOSIS — T148XXA Other injury of unspecified body region, initial encounter: Secondary | ICD-10-CM | POA: Diagnosis not present

## 2021-06-11 DIAGNOSIS — F258 Other schizoaffective disorders: Secondary | ICD-10-CM | POA: Diagnosis not present

## 2021-06-11 DIAGNOSIS — E559 Vitamin D deficiency, unspecified: Secondary | ICD-10-CM

## 2021-06-11 DIAGNOSIS — I10 Essential (primary) hypertension: Secondary | ICD-10-CM

## 2021-06-11 DIAGNOSIS — R7303 Prediabetes: Secondary | ICD-10-CM

## 2021-06-11 DIAGNOSIS — E611 Iron deficiency: Secondary | ICD-10-CM

## 2021-06-11 MED ORDER — IRON (FERROUS SULFATE) 325 (65 FE) MG PO TABS: ORAL_TABLET | ORAL | 2 refills | Status: DC

## 2021-06-11 MED ORDER — VITAMIN D (ERGOCALCIFEROL) 1.25 MG (50000 UNIT) PO CAPS: 50000.0000 [IU] | ORAL_CAPSULE | ORAL | 1 refills | Status: DC

## 2021-06-11 MED ORDER — CHLORTHALIDONE 25 MG PO TABS: 25.0000 mg | ORAL_TABLET | Freq: Every day | ORAL | 1 refills | Status: DC

## 2021-06-11 NOTE — Progress Notes (Signed)
Established Patient Office Visit  Subjective:  Patient ID: Bianca Medina, female    DOB: Mar 23, 1951  Age: 71 y.o. MRN: [id number]  CC:  Chief Complaint  Patient presents with   Bleeding/Bruising    Check bruise on right leg patient noticed yesterday. No pains.     HPI Bianca Medina presents for follow-up of hypertension, vitamin D deficiency, iron deficiency and prediabetes.  Continue with Glucophage for prediabetes.  Losartan was recently increased from 50 mg to 100 mg because blood pressure had been uncontrolled on the lower dose.  It turns out that the wrong dosage been entered into the chart and she was only taking 25 mg.  She took only her 25 mg pill this morning and blood pressure at least today appears to be controlled.  She is taking chlorthalidone with good effect in the past that also helps to control swelling and mild lower extremity edema.  She has yet to fill her vitamin D prescription she is not taking iron.  She feels as though people continue to break into her apartment and "mess with her medicines".  She has what she feels as though are spontaneous bruises on her lower extremities.  Past Medical History:  Diagnosis Date   Anemia 2003   IRON DEFICIENCY    Asthma    Common migraine 06/23/2019   Diabetes mellitus without complication (HCC)    GERD (gastroesophageal reflux disease)    Hyperlipidemia, mild    Hypertension    NSVD (normal spontaneous vaginal delivery)    X3   Obesity    PMDD (premenstrual dysphoric disorder)    Sleep disorder     Past Surgical History:  Procedure Laterality Date   ABDOMINAL HYSTERECTOMY     GUM SURGERY  1980's   TUBAL LIGATION      Family History  Problem Relation Age of Onset   Cancer Mother        LUNG- SMOKER   Diabetes Father    Hypertension Father    Heart disease Father    Stroke Father    Cancer Paternal Aunt        COLON   Cancer Paternal Uncle        COLON   Breast cancer Sister     Social History    Socioeconomic History   Marital status: Single    Spouse name: Not on file   Number of children: 3   Years of education: college   Highest education level: Not on file  Occupational History   Occupation: Architectural technologist: [employer]  Tobacco Use   Smoking status: Never   Smokeless tobacco: Never  Vaping Use   Vaping Use: Never used  Substance and Sexual Activity   Alcohol use: No   Drug use: No   Sexual activity: Not Currently    Birth control/protection: Surgical    Comment: HYSTERECTOMY-1st intercourse 17 yo-5 partners  Other Topics Concern   Not on file  Social History Narrative   Right handed     Caffeine use: has a drink every other day   Social Determinants of Health   Financial Resource Strain: Not on file  Food Insecurity: Not on file  Transportation Needs: Not on file  Physical Activity: Not on file  Stress: Not on file  Social Connections: Not on file  Intimate Partner Violence: Not on file    Outpatient Medications Prior to Visit  Medication Sig Dispense Refill   Bioflavonoid  Products (BIOFLEX PO) Take 1 tablet by mouth daily.     Biotin 5000 MCG CAPS Take 5,000 mcg by mouth daily.      blood glucose meter kit and supplies KIT Dispense based on patient and insurance preference. Use up to 3 times a day as indicated. 1 each 0   esomeprazole (NEXIUM) 40 MG capsule Take 40 mg by mouth daily.      fluticasone (FLONASE) 50 MCG/ACT nasal spray Place 2 sprays into both nostrils daily. 16 g 6   Loratadine 10 MG CHEW      metFORMIN (GLUCOPHAGE XR) 500 MG 24 hr tablet Take 1 tablet (500 mg total) by mouth daily with breakfast. 90 tablet 1   PROAIR HFA 108 (90 Base) MCG/ACT inhaler Inhale 1-2 puffs into the lungs every 4 (four) hours as needed for wheezing or shortness of breath.      SYMBICORT 80-4.5 MCG/ACT inhaler Inhale into the lungs.     triamcinolone cream (KENALOG) 0.1 % Apply 1 application topically 2 (two)  times daily. Use the smallest amount possible for no more than 7 days 15 g 0   losartan (COZAAR) 100 MG tablet Take 1 tablet (100 mg total) by mouth daily. 90 tablet 1   montelukast (SINGULAIR) 10 MG tablet Take 10 mg by mouth daily. (Patient not taking: Reported on 06/11/2021)     predniSONE (DELTASONE) 5 MG tablet Take by mouth.     Vitamin D, Ergocalciferol, (DRISDOL) 1.25 MG (50000 UNIT) CAPS capsule Take 1 capsule (50,000 Units total) by mouth every 7 (seven) days. (Patient not taking: Reported on 06/11/2021) 15 capsule 1   No facility-administered medications prior to visit.    Allergies  Allergen Reactions   Codeine Other (See Comments)    nightmares Other reaction(s): Other nightmares nightmares    ROS Review of Systems  Constitutional:  Negative for diaphoresis, fatigue, fever and unexpected weight change.  HENT: Negative.    Eyes:  Negative for photophobia and visual disturbance.  Respiratory: Negative.    Cardiovascular: Negative.   Gastrointestinal: Negative.   Endocrine: Negative for polyphagia.  Genitourinary: Negative.   Musculoskeletal:  Negative for gait problem and joint swelling.  Neurological:  Negative for speech difficulty and weakness.  Hematological:  Bruises/bleeds easily.     Objective:    Physical Exam Vitals and nursing note reviewed.  Constitutional:      General: She is not in acute distress.    Appearance: Normal appearance. She is not ill-appearing, toxic-appearing or diaphoretic.  HENT:     Head: Normocephalic and atraumatic.     Right Ear: External ear normal.     Left Ear: External ear normal.  Eyes:     General: No scleral icterus.       Right eye: No discharge.        Left eye: No discharge.     Extraocular Movements: Extraocular movements intact.     Conjunctiva/sclera: Conjunctivae normal.  Cardiovascular:     Rate and Rhythm: Normal rate and regular rhythm.  Pulmonary:     Effort: Pulmonary effort is normal.     Breath  sounds: Normal breath sounds.  Musculoskeletal:     Cervical back: No rigidity or tenderness.       Legs:  Lymphadenopathy:     Cervical: No cervical adenopathy.  Skin:      Neurological:     General: No focal deficit present.     Mental Status: She is alert and oriented to person, place, and  time.  Psychiatric:        Attention and Perception: Attention normal.        Mood and Affect: Mood and affect normal.        Speech: Speech normal.        Behavior: Behavior normal.     Comments: Exhibits paranoia.    BP 126/74 (BP Location: Right Arm, Patient Position: Sitting, Cuff Size: Large)    Pulse 79    Temp (!) 97.3 F (36.3 C) (Temporal)    Ht '5\' 4"'  (1.626 m)    Wt 260 lb (117.9 kg)    SpO2 94%    BMI 44.63 kg/m  Wt Readings from Last 3 Encounters:  06/11/21 260 lb (117.9 kg)  05/13/21 268 lb 6.4 oz (121.7 kg)  05/04/21 260 lb (117.9 kg)     Health Maintenance Due  Topic Date Due   Zoster Vaccines- Shingrix (1 of 2) Never done   Pneumonia Vaccine 75+ Years old (2 - PPSV23 if available, else PCV20) 04/25/2013    There are no preventive care reminders to display for this patient.  Lab Results  Component Value Date   TSH 0.79 04/22/2012   Lab Results  Component Value Date   WBC 10.4 05/04/2021   HGB 12.5 05/04/2021   HCT 38.5 05/04/2021   MCV 76.2 (L) 05/04/2021   PLT 269 05/04/2021   Lab Results  Component Value Date   NA 138 05/04/2021   K 3.8 05/04/2021   CO2 27 05/04/2021   GLUCOSE 133 (H) 05/04/2021   BUN 10 05/04/2021   CREATININE 0.83 05/04/2021   BILITOT 0.7 05/04/2021   ALKPHOS 89 05/04/2021   AST 18 05/04/2021   ALT 18 05/04/2021   PROT 6.7 05/04/2021   ALBUMIN 3.1 (L) 05/04/2021   CALCIUM 8.5 (L) 05/04/2021   ANIONGAP 5 05/04/2021   GFR 78.99 03/19/2021   Lab Results  Component Value Date   CHOL 163 03/19/2021   Lab Results  Component Value Date   HDL 42.70 03/19/2021   Lab Results  Component Value Date   LDLCALC 105 (H) 03/19/2021    Lab Results  Component Value Date   TRIG 75.0 03/19/2021   Lab Results  Component Value Date   CHOLHDL 4 03/19/2021   Lab Results  Component Value Date   HGBA1C 6.9 (H) 03/19/2021      Assessment & Plan:   Problem List Items Addressed This Visit       Cardiovascular and Mediastinum   Essential hypertension   Relevant Medications   chlorthalidone (HYGROTON) 25 MG tablet     Other   Morbid obesity (HCC)   Pre-diabetes   Vitamin D deficiency   Relevant Medications   Vitamin D, Ergocalciferol, (DRISDOL) 1.25 MG (50000 UNIT) CAPS capsule   Iron deficiency   Relevant Medications   Iron, Ferrous Sulfate, 325 (65 Fe) MG TABS   Other schizoaffective disorders (HCC)   Bruising - Primary   Relevant Orders   Lactate dehydrogenase    Meds ordered this encounter  Medications   Iron, Ferrous Sulfate, 325 (65 Fe) MG TABS    Sig: Take one tablet every other day.    Dispense:  45 tablet    Refill:  2   Vitamin D, Ergocalciferol, (DRISDOL) 1.25 MG (50000 UNIT) CAPS capsule    Sig: Take 1 capsule (50,000 Units total) by mouth every 7 (seven) days.    Dispense:  15 capsule    Refill:  1   chlorthalidone (  HYGROTON) 25 MG tablet    Sig: Take 1 tablet (25 mg total) by mouth daily.    Dispense:  90 tablet    Refill:  1    Follow-up: Return in about 3 months (around 09/09/2021), or if symptoms worsen or fail to improve.  She will stop losartan.  We will treat with chlorthalidone.  Discussed a high potassium diet with her.  Urged her to take her iron and vitamin D.  Follow-up 3 months.  Libby Maw, MD

## 2021-06-12 LAB — LACTATE DEHYDROGENASE: LDH: 251 U/L — ABNORMAL HIGH (ref 120–250)

## 2021-06-12 LAB — SPECIMEN COMPROMISED

## 2021-06-13 DIAGNOSIS — M5136 Other intervertebral disc degeneration, lumbar region: Secondary | ICD-10-CM | POA: Diagnosis not present

## 2021-06-13 DIAGNOSIS — M6281 Muscle weakness (generalized): Secondary | ICD-10-CM | POA: Diagnosis not present

## 2021-06-13 DIAGNOSIS — M5416 Radiculopathy, lumbar region: Secondary | ICD-10-CM | POA: Diagnosis not present

## 2021-06-17 DIAGNOSIS — M5416 Radiculopathy, lumbar region: Secondary | ICD-10-CM | POA: Diagnosis not present

## 2021-06-17 DIAGNOSIS — M5136 Other intervertebral disc degeneration, lumbar region: Secondary | ICD-10-CM | POA: Diagnosis not present

## 2021-06-17 DIAGNOSIS — M6281 Muscle weakness (generalized): Secondary | ICD-10-CM | POA: Diagnosis not present

## 2021-06-17 NOTE — Addendum Note (Signed)
Addended by: Jon Billings on: 06/17/2021 07:42 AM   Modules accepted: Orders

## 2021-06-19 ENCOUNTER — Ambulatory Visit: Payer: Medicare PPO | Admitting: Family Medicine

## 2021-06-19 DIAGNOSIS — M6281 Muscle weakness (generalized): Secondary | ICD-10-CM | POA: Diagnosis not present

## 2021-06-19 DIAGNOSIS — M5416 Radiculopathy, lumbar region: Secondary | ICD-10-CM | POA: Diagnosis not present

## 2021-06-19 DIAGNOSIS — M5136 Other intervertebral disc degeneration, lumbar region: Secondary | ICD-10-CM | POA: Diagnosis not present

## 2021-06-24 ENCOUNTER — Ambulatory Visit: Payer: Medicare PPO | Admitting: Family Medicine

## 2021-06-25 DIAGNOSIS — M5136 Other intervertebral disc degeneration, lumbar region: Secondary | ICD-10-CM | POA: Diagnosis not present

## 2021-06-25 DIAGNOSIS — M5416 Radiculopathy, lumbar region: Secondary | ICD-10-CM | POA: Diagnosis not present

## 2021-06-25 DIAGNOSIS — M6281 Muscle weakness (generalized): Secondary | ICD-10-CM | POA: Diagnosis not present

## 2021-07-03 DIAGNOSIS — E119 Type 2 diabetes mellitus without complications: Secondary | ICD-10-CM | POA: Diagnosis not present

## 2021-07-03 DIAGNOSIS — M25551 Pain in right hip: Secondary | ICD-10-CM | POA: Diagnosis not present

## 2021-07-03 LAB — HM DIABETES EYE EXAM

## 2021-07-04 DIAGNOSIS — M5416 Radiculopathy, lumbar region: Secondary | ICD-10-CM | POA: Diagnosis not present

## 2021-07-04 DIAGNOSIS — M5136 Other intervertebral disc degeneration, lumbar region: Secondary | ICD-10-CM | POA: Diagnosis not present

## 2021-07-04 DIAGNOSIS — M6281 Muscle weakness (generalized): Secondary | ICD-10-CM | POA: Diagnosis not present

## 2021-07-09 ENCOUNTER — Encounter: Payer: Self-pay | Admitting: Family Medicine

## 2021-07-10 DIAGNOSIS — M5136 Other intervertebral disc degeneration, lumbar region: Secondary | ICD-10-CM | POA: Diagnosis not present

## 2021-07-10 DIAGNOSIS — M6281 Muscle weakness (generalized): Secondary | ICD-10-CM | POA: Diagnosis not present

## 2021-07-10 DIAGNOSIS — M5416 Radiculopathy, lumbar region: Secondary | ICD-10-CM | POA: Diagnosis not present

## 2021-07-13 ENCOUNTER — Other Ambulatory Visit: Payer: Self-pay | Admitting: Family Medicine

## 2021-07-13 ENCOUNTER — Other Ambulatory Visit: Payer: Self-pay

## 2021-07-13 ENCOUNTER — Encounter (HOSPITAL_COMMUNITY): Payer: Self-pay | Admitting: *Deleted

## 2021-07-13 ENCOUNTER — Ambulatory Visit (HOSPITAL_COMMUNITY)
Admission: EM | Admit: 2021-07-13 | Discharge: 2021-07-13 | Disposition: A | Discharge status: Home / Self Care | Payer: Medicare PPO | Attending: Family Medicine | Admitting: Family Medicine

## 2021-07-13 DIAGNOSIS — R202 Paresthesia of skin: Secondary | ICD-10-CM

## 2021-07-13 DIAGNOSIS — R221 Localized swelling, mass and lump, neck: Secondary | ICD-10-CM | POA: Diagnosis not present

## 2021-07-13 DIAGNOSIS — R7303 Prediabetes: Secondary | ICD-10-CM

## 2021-07-13 NOTE — Discharge Instructions (Addendum)
Your appointment with your primary care office for mid March.  If you begin having worsening of your tingling in your face, or worsening in the neck swelling your feeling, proceed to the emergency room for possible urgent imaging

## 2021-07-13 NOTE — ED Triage Notes (Signed)
Pt reports tingling to Lt side of face for one week and swelling to Rt side of neck.

## 2021-07-13 NOTE — ED Provider Notes (Signed)
[facility]    CSN: [id number] Arrival date & time: 07/13/21  1112      History   Chief Complaint Chief Complaint  Patient presents with   tingling to face   neck swelling    HPI Bianca Medina is a 71 y.o. female.   HPI Here for a 1 week history of left facial paresthesias and tingling.  It is not keeping her up at night.  She also has a feeling of being swollen in the right side of her neck.  This happened about a month ago and resolved within a few days and now she is feeling it again.  No fever or chills.  No nausea, vomiting, or diarrhea.  No cough or cold symptoms.  No ear pain.  Not much headache lately.  She does have diabetes and has had no neuropathic symptoms previously.  No rash seen.  She has had shingles in her lumbar region previously some years ago  Past Medical History:  Diagnosis Date   Adenomatous colon polyp 2021   Anemia 2003   IRON DEFICIENCY    Asthma    Chronic anal fissure    Common migraine 06/23/2019   Diabetes mellitus without complication (HCC)    GERD (gastroesophageal reflux disease)    Hearing loss    Hyperlipidemia, mild    Hypertension    IDA (iron deficiency anemia)    NSVD (normal spontaneous vaginal delivery)    X3   Obesity    PMDD (premenstrual dysphoric disorder)    Sensorineural hearing loss (SNHL) of both ears    Sleep disorder     Patient Active Problem List   Diagnosis Date Noted   Bruising 06/11/2021   Other schizoaffective disorders ([place]) 04/30/2021   Thyroid nodule 04/30/2021   Screening for colon cancer 04/01/2021   Insect bite of right wrist 04/01/2021   Bite, insect 04/01/2021   Rash 03/28/2021   Iron deficiency 03/19/2021   Need for influenza vaccination 03/19/2021   Snoring 01/27/2021   Sleep related bruxism 01/27/2021   Gasping for breath 01/27/2021   Insomnia due to anxiety and fear 01/27/2021   Class 3 severe obesity due to excess calories with serious comorbidity and body mass index  (BMI) of 40.0 to 44.9 in adult [facility]) 01/27/2021   Medicare annual wellness visit, subsequent 01/24/2021   Dehydration 12/26/2020   History of syncope 12/26/2020   Hospital discharge follow-up 12/26/2020   Healthcare maintenance 12/01/2020   Vitamin D deficiency 12/01/2020   Ear itching 06/27/2019   Common migraine 06/23/2019   History of anemia 03/31/2019   Pre-diabetes 03/31/2019   Viral syndrome 03/31/2019   Sensorineural hearing loss (SNHL), bilateral 08/12/2017   Tinnitus of both ears 08/12/2017   Morbid obesity ([place]) 12/28/2016   Left breast mass 07/19/2014   Breast tenderness in female 07/19/2014   Menopause 01/09/2014   Vaginal atrophy 01/09/2014   Skin lesion 12/23/2012   Essential hypertension 10/31/2012   Heartburn 10/31/2012    Past Surgical History:  Procedure Laterality Date   ABDOMINAL HYSTERECTOMY     BLADDER REPAIR     COLONOSCOPY  11/2019   1 hpp and 1 tubl adenoma   GUM SURGERY  1980's   MANDIBLE FRACTURE SURGERY     TUBAL LIGATION      OB History     Gravida  3   Para  3   Term      Preterm      AB  Living  3      SAB      IAB      Ectopic      Multiple      Live Births               Home Medications    Prior to Admission medications   Medication Sig Start Date End Date Taking? Authorizing Provider  Bioflavonoid Products (BIOFLEX PO) Take 1 tablet by mouth daily.    [provider]  Biotin 5000 MCG CAPS Take 5,000 mcg by mouth daily.     [provider]  blood glucose meter kit and supplies KIT Dispense based on patient and insurance preference. Use up to 3 times a day as indicated. 04/30/21   Libby Maw, MD  chlorthalidone (HYGROTON) 25 MG tablet Take 1 tablet (25 mg total) by mouth daily. 06/11/21   Libby Maw, MD  esomeprazole (NEXIUM) 40 MG capsule Take 40 mg by mouth daily.     [provider]  fluticasone (FLONASE) 50 MCG/ACT nasal spray Place 2 sprays into both  nostrils daily. 05/13/21   Libby Maw, MD  Iron, Ferrous Sulfate, 325 (65 Fe) MG TABS Take one tablet every other day. 06/11/21   Libby Maw, MD  Loratadine 10 MG CHEW     [provider]  metFORMIN (GLUCOPHAGE XR) 500 MG 24 hr tablet Take 1 tablet (500 mg total) by mouth daily with breakfast. 12/01/20   Libby Maw, MD  montelukast (SINGULAIR) 10 MG tablet Take 10 mg by mouth daily. Patient not taking: Reported on 06/11/2021 11/14/20   [provider]  PROAIR HFA 108 [phone] Base) MCG/ACT inhaler Inhale 1-2 puffs into the lungs every 4 (four) hours as needed for wheezing or shortness of breath.  03/16/18   [provider]  SYMBICORT 80-4.5 MCG/ACT inhaler Inhale into the lungs. 11/16/20   [provider]  triamcinolone cream (KENALOG) 0.1 % Apply 1 application topically 2 (two) times daily. Use the smallest amount possible for no more than 7 days 04/01/21   Michela Pitcher, NP  Vitamin D, Ergocalciferol, (DRISDOL) 1.25 MG (50000 UNIT) CAPS capsule Take 1 capsule (50,000 Units total) by mouth every 7 (seven) days. 06/11/21   Libby Maw, MD    Family History Family History  Problem Relation Age of Onset   Cancer Mother        LUNG- SMOKER   Diabetes Father    Hypertension Father    Heart disease Father    Stroke Father    Cancer Paternal Aunt        COLON   Cancer Paternal Uncle        COLON   Breast cancer Sister     Social History Social History   Tobacco Use   Smoking status: Never   Smokeless tobacco: Never  Vaping Use   Vaping Use: Never used  Substance Use Topics   Alcohol use: Not Currently   Drug use: Never     Allergies   Codeine   Review of Systems Review of Systems   Physical Exam Triage Vital Signs ED Triage Vitals  Enc Vitals Group     BP 07/13/21 1228 (!) 163/90     Pulse Rate 07/13/21 1228 (!) 54     Resp 07/13/21 1228 16     Temp 07/13/21 1228 98.2 F (36.8 C)     Temp src  --      SpO2 07/13/21 1228  98 %     Weight --      Height --      Head Circumference --      Peak Flow --      Pain Score 07/13/21 1226 0     Pain Loc --      Pain Edu? --      Excl. in [place]? --    No data found.  Updated Vital Signs BP (!) 163/90    Pulse (!) 54    Temp 98.2 F (36.8 C)    Resp 16    SpO2 98%   Visual Acuity Right Eye Distance:   Left Eye Distance:   Bilateral Distance:    Right Eye Near:   Left Eye Near:    Bilateral Near:     Physical Exam Vitals reviewed.  Constitutional:      General: She is not in acute distress.    Appearance: She is not toxic-appearing.  HENT:     Head:     Comments: No rash. No facial droop. Can wrinkle forehead symmetrically    Right Ear: Tympanic membrane normal.     Left Ear: Tympanic membrane normal.     Nose: Nose normal.     Mouth/Throat:     Mouth: Mucous membranes are moist.     Pharynx: No oropharyngeal exudate or posterior oropharyngeal erythema.  Eyes:     Extraocular Movements: Extraocular movements intact.     Conjunctiva/sclera: Conjunctivae normal.     Pupils: Pupils are equal, round, and reactive to light.  Neck:     Comments: No swelling or mass discernable today on exam. Cardiovascular:     Rate and Rhythm: Normal rate and regular rhythm.     Heart sounds: No murmur heard. Pulmonary:     Effort: Pulmonary effort is normal.     Breath sounds: Normal breath sounds. No wheezing, rhonchi or rales.  Musculoskeletal:     Cervical back: Neck supple. No tenderness.  Lymphadenopathy:     Cervical: No cervical adenopathy.  Skin:    Capillary Refill: Capillary refill takes less than 2 seconds.     Coloration: Skin is not jaundiced or pale.  Neurological:     General: No focal deficit present.     Mental Status: She is alert and oriented to person, place, and time.  Psychiatric:        Behavior: Behavior normal.     UC Treatments / Results  Labs (all labs ordered are listed, but only abnormal results  are displayed) Labs Reviewed - No data to display  EKG   Radiology No results found.  Procedures Procedures (including critical care time)  Medications Ordered in UC Medications - No data to display  Initial Impression / Assessment and Plan / UC Course  I have reviewed the triage vital signs and the nursing notes.  Pertinent labs & imaging results that were available during my care of the patient were reviewed by me and considered in my medical decision making (see chart for details).     Discussed possible diagnoses and reason she is having the symptoms.  She will look out for any shingles type rash developing, and get seen again on an urgent basis to get treatment if that happens.  Also if the tingling becomes acutely worse or if she begins to note acute swelling in her right side of her neck, she will proceed to the ER for urgent imaging.  Otherwise she will be seeing her primary care  office in approximately 2 weeks and will discuss the symptoms further with them. Final Clinical Impressions(s) / UC Diagnoses   Final diagnoses:  Paresthesia  Neck swelling     Discharge Instructions      Your appointment with your primary care office for mid March.  If you begin having worsening of your tingling in your face, or worsening in the neck swelling your feeling, proceed to the emergency room for possible urgent imaging     ED Prescriptions   None    PDMP not reviewed this encounter.   Barrett Henle, MD 07/13/21 1255

## 2021-07-14 ENCOUNTER — Other Ambulatory Visit: Payer: Self-pay

## 2021-07-14 ENCOUNTER — Emergency Department (HOSPITAL_COMMUNITY): Payer: Medicare PPO

## 2021-07-14 ENCOUNTER — Encounter (HOSPITAL_COMMUNITY): Payer: Self-pay | Admitting: Emergency Medicine

## 2021-07-14 ENCOUNTER — Emergency Department (HOSPITAL_COMMUNITY)
Admission: EM | Admit: 2021-07-14 | Discharge: 2021-07-14 | Disposition: A | Discharge status: Home / Self Care | Payer: Medicare PPO | Attending: Emergency Medicine | Admitting: Emergency Medicine

## 2021-07-14 DIAGNOSIS — R9431 Abnormal electrocardiogram [ECG] [EKG]: Secondary | ICD-10-CM | POA: Diagnosis not present

## 2021-07-14 DIAGNOSIS — J90 Pleural effusion, not elsewhere classified: Secondary | ICD-10-CM | POA: Diagnosis not present

## 2021-07-14 DIAGNOSIS — Z0389 Encounter for observation for other suspected diseases and conditions ruled out: Secondary | ICD-10-CM | POA: Diagnosis not present

## 2021-07-14 DIAGNOSIS — I10 Essential (primary) hypertension: Secondary | ICD-10-CM | POA: Diagnosis not present

## 2021-07-14 DIAGNOSIS — R200 Anesthesia of skin: Secondary | ICD-10-CM | POA: Diagnosis not present

## 2021-07-14 DIAGNOSIS — R202 Paresthesia of skin: Secondary | ICD-10-CM | POA: Diagnosis not present

## 2021-07-14 LAB — CBC
HCT: 37.3 % (ref 36.0–46.0)
Hemoglobin: 12.4 g/dL (ref 12.0–15.0)
MCH: 25.4 pg — ABNORMAL LOW (ref 26.0–34.0)
MCHC: 33.2 g/dL (ref 30.0–36.0)
MCV: 76.4 fL — ABNORMAL LOW (ref 80.0–100.0)
Platelets: 250 10*3/uL (ref 150–400)
RBC: 4.88 MIL/uL (ref 3.87–5.11)
RDW: 15.3 % (ref 11.5–15.5)
WBC: 12.1 10*3/uL — ABNORMAL HIGH (ref 4.0–10.5)
nRBC: 0 % (ref 0.0–0.2)

## 2021-07-14 LAB — TROPONIN I (HIGH SENSITIVITY): Troponin I (High Sensitivity): 8 ng/L (ref <18)

## 2021-07-14 LAB — BASIC METABOLIC PANEL
Anion gap: 7 (ref 5–15)
BUN: 15 mg/dL (ref 8–23)
CO2: 29 mmol/L (ref 22–32)
Calcium: 8.6 mg/dL — ABNORMAL LOW (ref 8.9–10.3)
Chloride: 104 mmol/L (ref 98–111)
Creatinine, Ser: 0.79 mg/dL (ref 0.44–1.00)
GFR, Estimated: >60 mL/min (ref >60)
Glucose, Bld: 123 mg/dL — ABNORMAL HIGH (ref 70–99)
Potassium: 4 mmol/L (ref 3.5–5.1)
Sodium: 140 mmol/L (ref 135–145)

## 2021-07-14 LAB — TSH: TSH: 0.74 u[IU]/mL (ref 0.350–4.500)

## 2021-07-14 IMAGING — MR MR HEAD W/O CM
6 of 11 series · 24 of 48 positions shown · non-contrast
Comparison: Same-day noncontrast CT head, MR head [DATE]

CLINICAL DATA: Stroke suspected

EXAM:
MRI HEAD WITHOUT CONTRAST
TECHNIQUE: Multiplanar, multiecho pulse sequences of the brain and surrounding
structures were obtained without intravenous contrast.

[Series 2: DWI · axial · 3.0mm · 0.94mm/px · z∈[-79,+68]mm · 7 of 99 slices shown (1 of 2)]
[im 1/99]
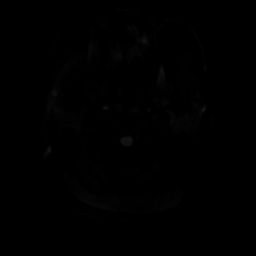
[im 17/99]
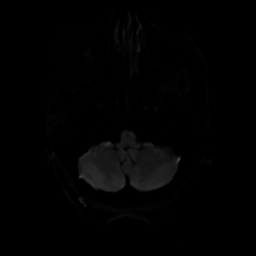
[im 33/99]
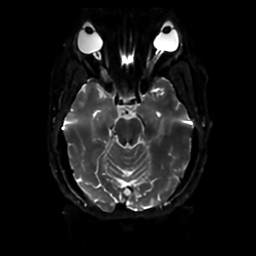
[im 50/99]
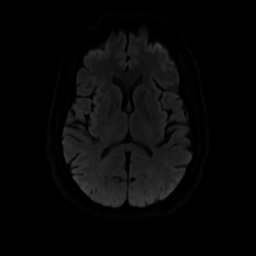
[im 66/99]
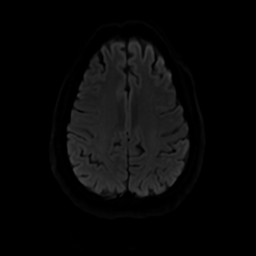
[im 82/99]
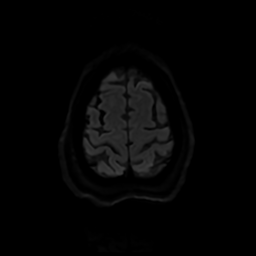
[im 99/99]
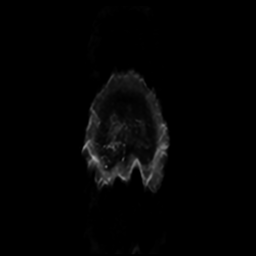

[Series 3: DWI · coronal · 4.0mm · 0.94mm/px · 5 of 74 slices shown (2 of 2)]
[im 1/74]
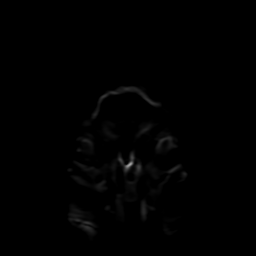
[im 19/74]
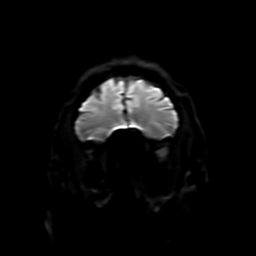
[im 37/74]
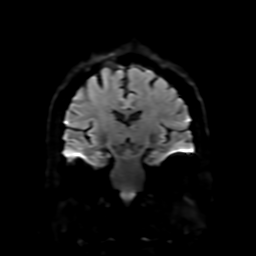
[im 55/74]
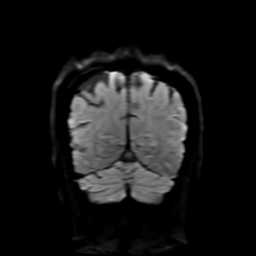
[im 74/74]
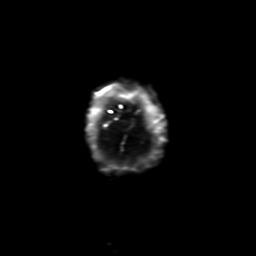

[Series 4: FLAIR · sagittal · 5.0mm · 0.23mm/px · 2 of 23 slices shown (1 of 2)]
[im 1/23]
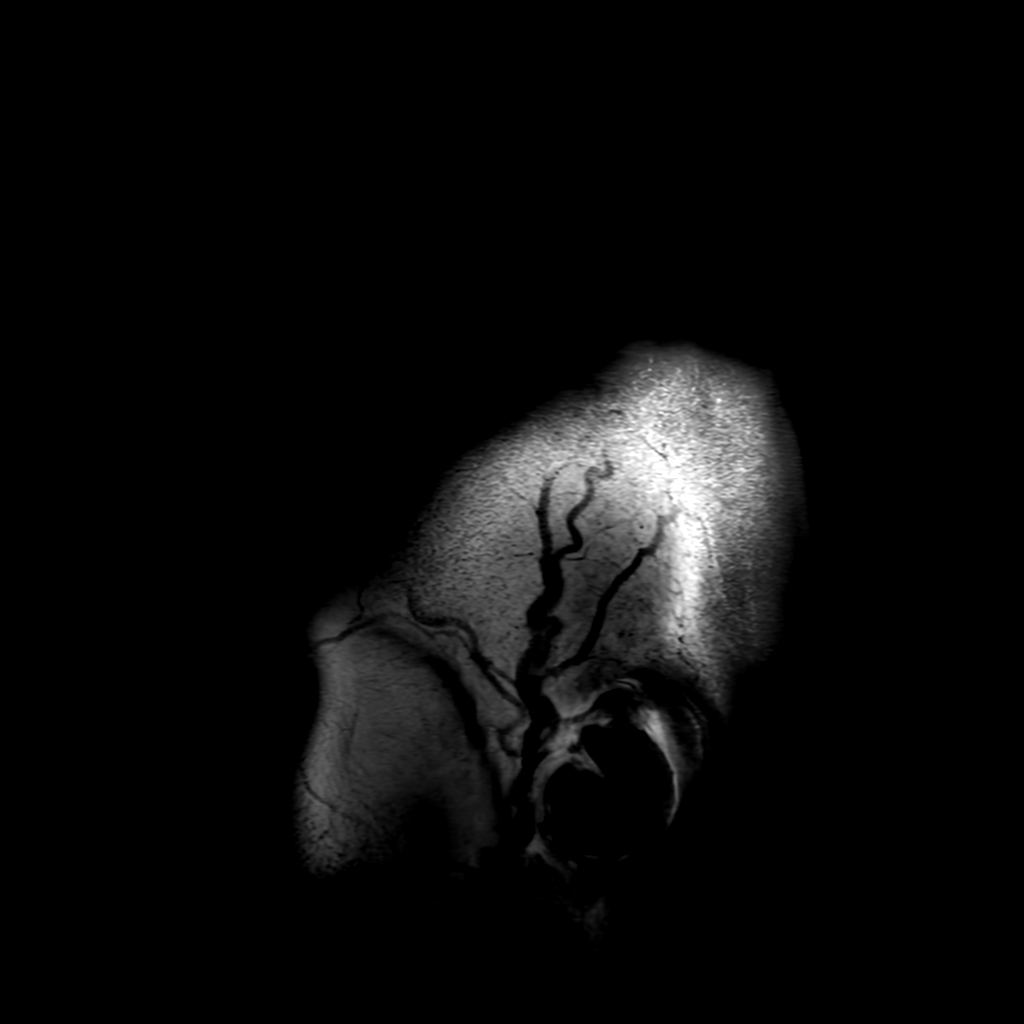
[im 23/23]
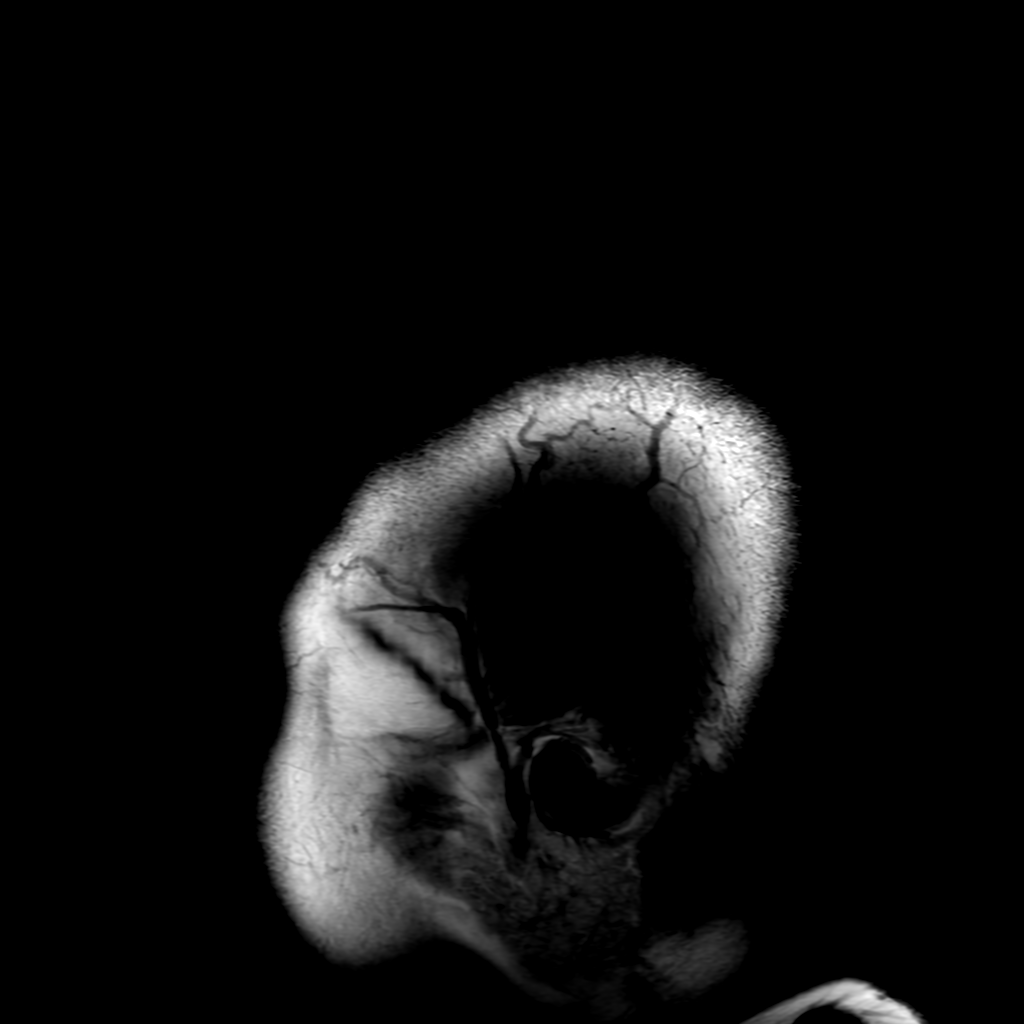

[Series 6: FLAIR · axial · 4.0mm · 0.45mm/px · z∈[-80,+69]mm · 3 of 35 slices shown (2 of 2)]
[im 1/35]
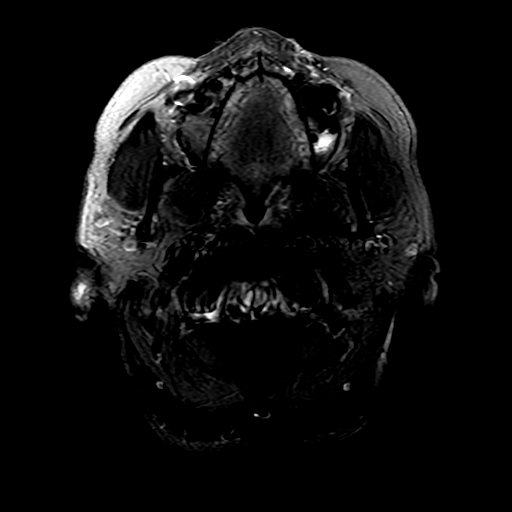
[im 18/35]
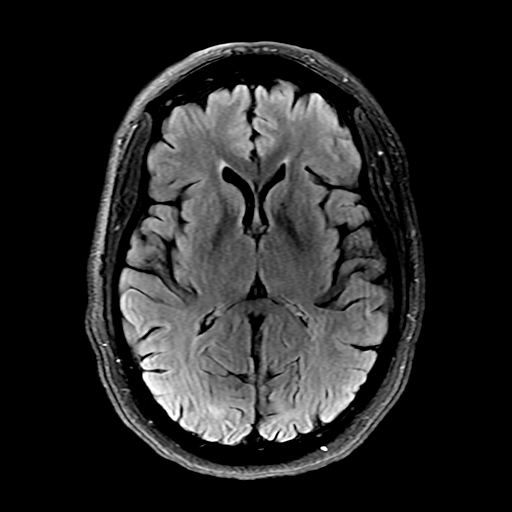
[im 35/35]
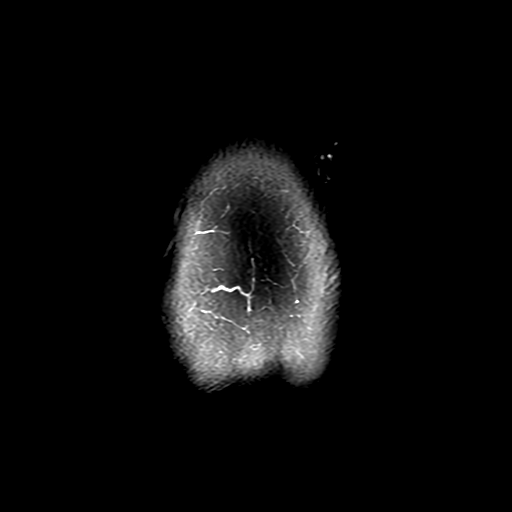

[Series 250: ADC · axial · 3.0mm · 0.94mm/px · z∈[-79,+68]mm · 4 of 50 slices shown (1 of 2)]
[im 1/50]
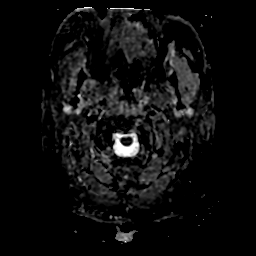
[im 17/50]
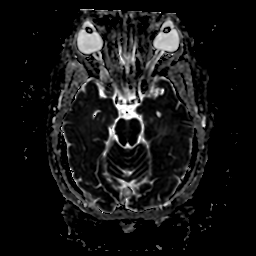
[im 33/50]
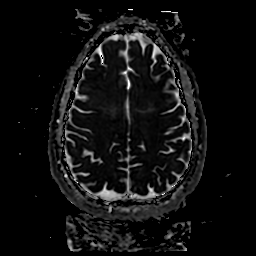
[im 50/50]
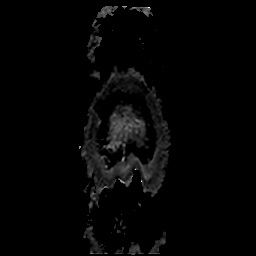

[Series 350: ADC · coronal · 4.0mm · 0.94mm/px · 3 of 37 slices shown (2 of 2)]
[im 1/37]
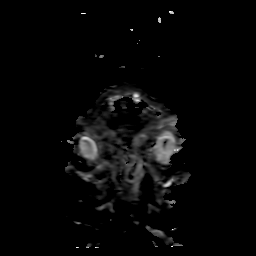
[im 19/37]
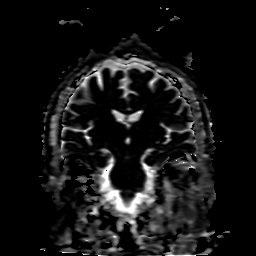
[im 37/37]
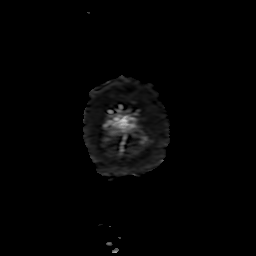

[24 of 48 positions shown; findings below may reference images not displayed]

FINDINGS: Brain: There is no evidence of acute intracranial hemorrhage,
extra-axial fluid collection, or acute infarct

Parenchymal volume is normal. The ventricles are normal in size.
Parenchymal signal is normal for age, with no significant burden of
white matter microangiopathic change

There is no suspicious parenchymal signal abnormality. There is no
mass lesion. There is no midline shift. The sella is mildly expanded
and partially empty, unchanged.

Vascular: Normal flow voids.

Skull and upper cervical spine: Normal marrow signal.

Sinuses/Orbits: There is mucosal thickening in the right maxillary
sinus with associated hyperostosis. The globes and orbits are
unremarkable.

Other: There is a small right mastoid effusion.
IMPRESSION: Normal appearance of the brain with no acute intracranial pathology.

## 2021-07-14 IMAGING — CT CT HEAD W/O CM
4 series · 17 of 47 positions shown, 19 images · non-contrast
Comparison: [DATE]

CLINICAL DATA: Numbness left side of face



[Series 3: head wo · axial · 0.43mm/px · z∈[-626,-506]mm · 7 of 34 slices shown, 9 images]
[im 5/34  brain]
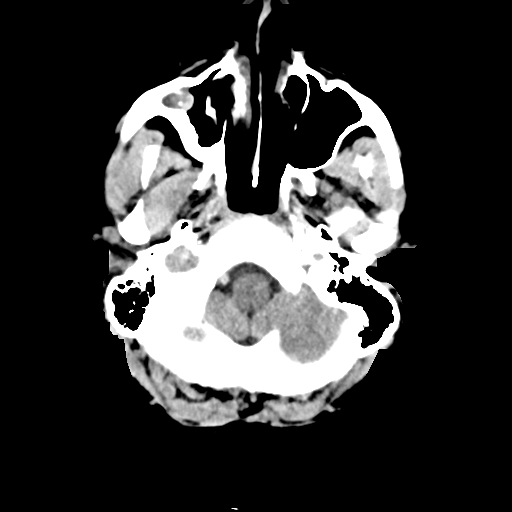
[im 5/34  bone]
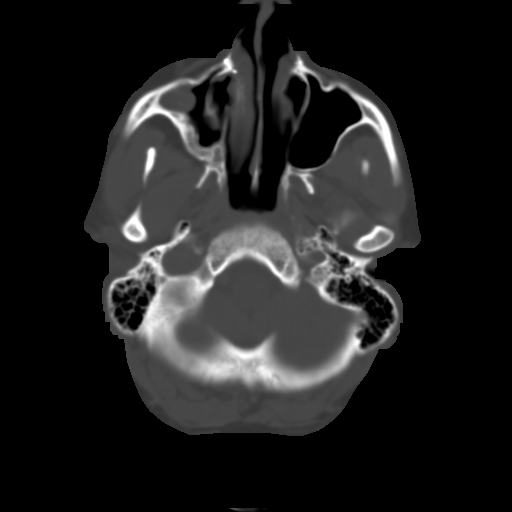
[im 9/34  brain]
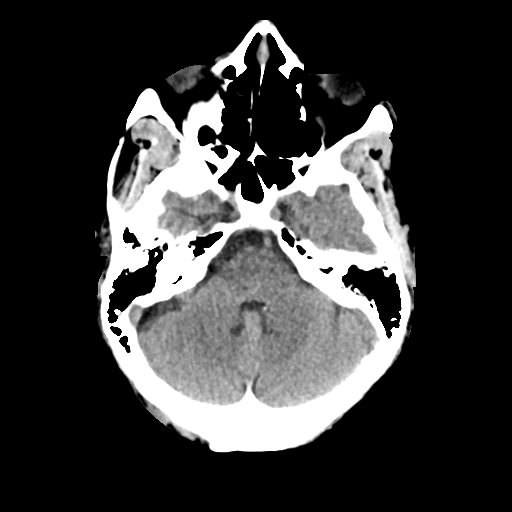
[im 13/34  brain]
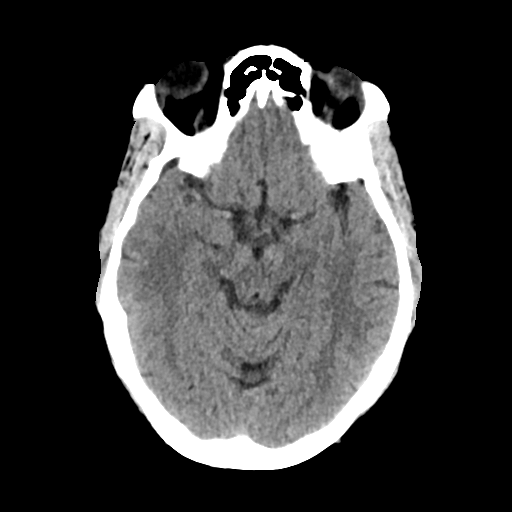
[im 17/34  brain]
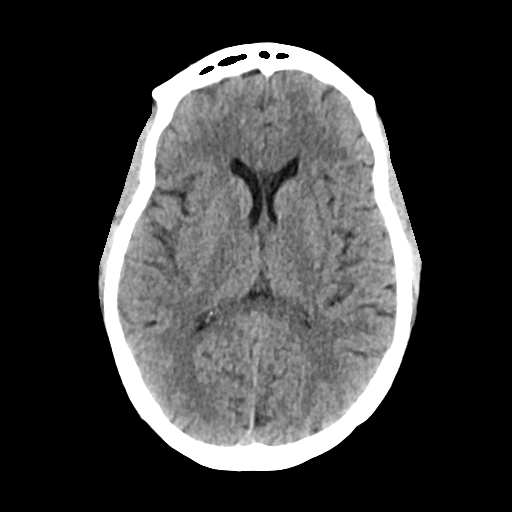
[im 21/34  brain]
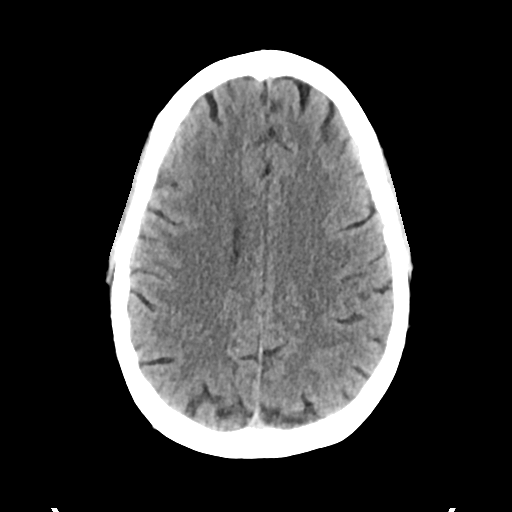
[im 21/34  bone]
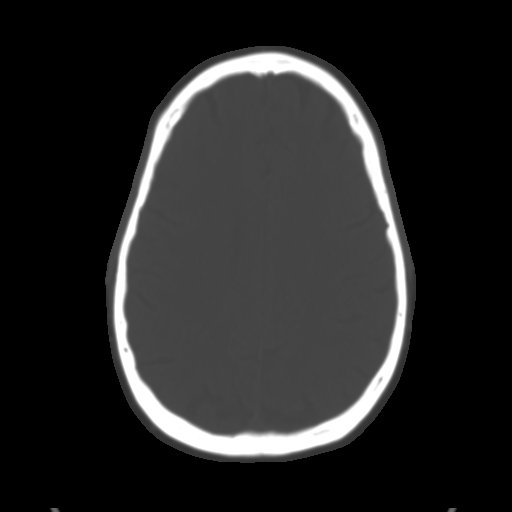
[im 25/34  brain]
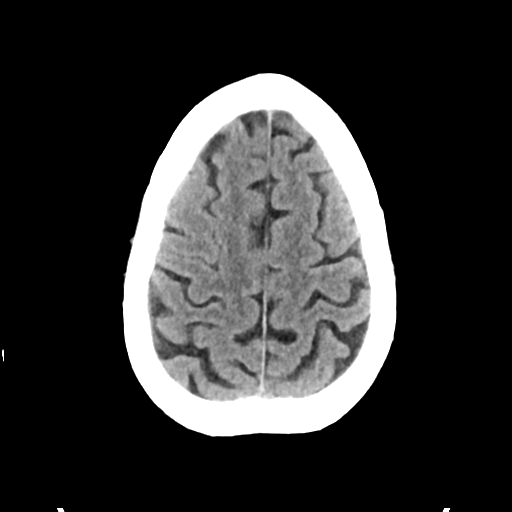
[im 29/34  brain]
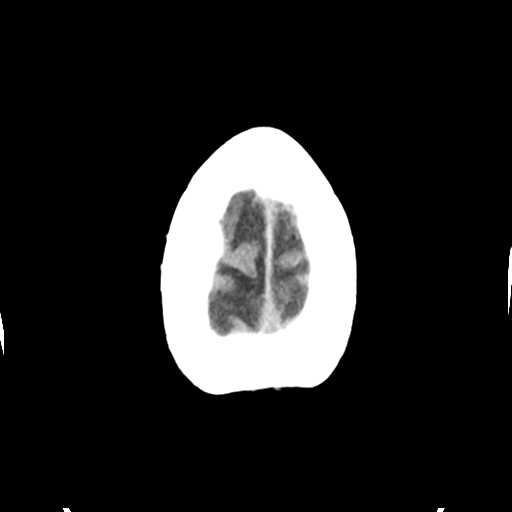

[Series 4: head bone · axial · 0.43mm/px · z∈[-630,-572]mm · 4 of 84 slices shown]
[im 9/84  bone]
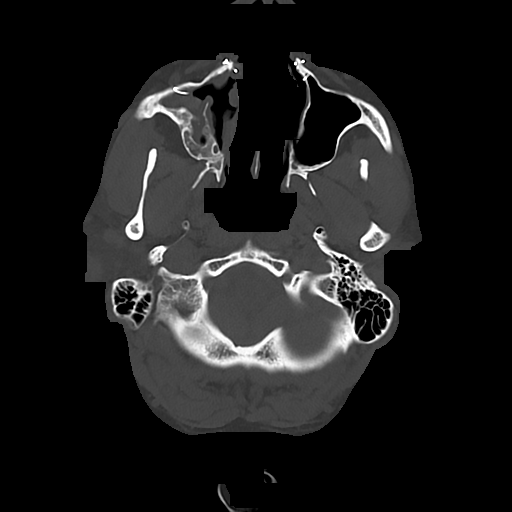
[im 17/84  bone]
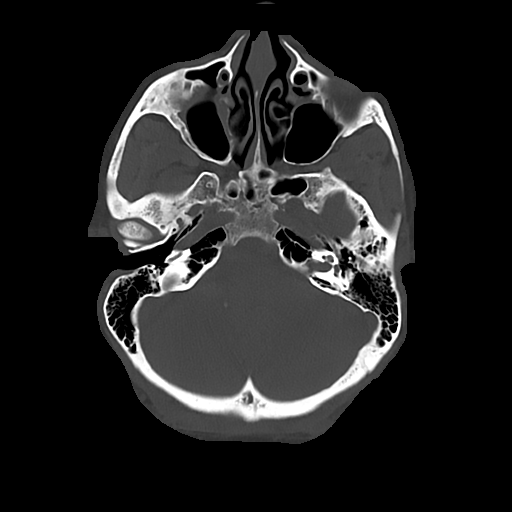
[im 25/84  bone]
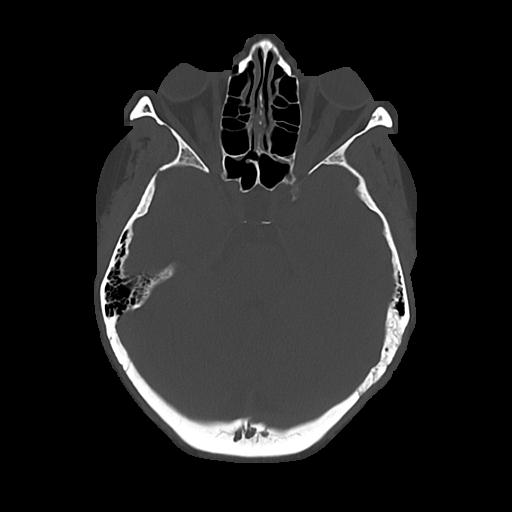
[im 38/84  bone]
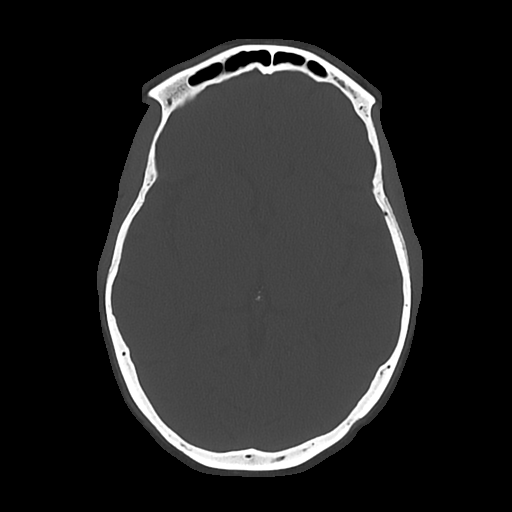

[Series 5: cor soft · coronal · 0.34mm/px · 3 of 71 slices shown]
[im 24/71  brain]
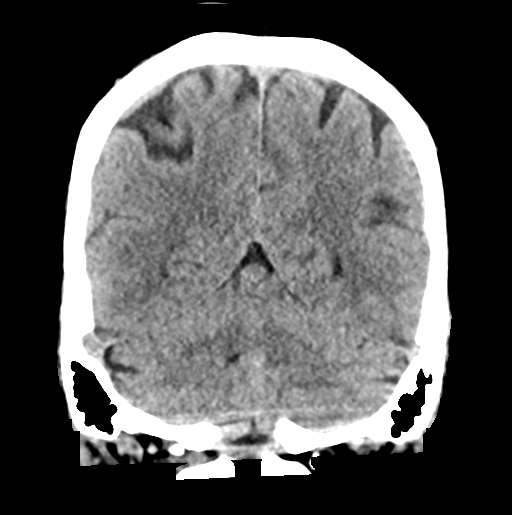
[im 32/71  brain]
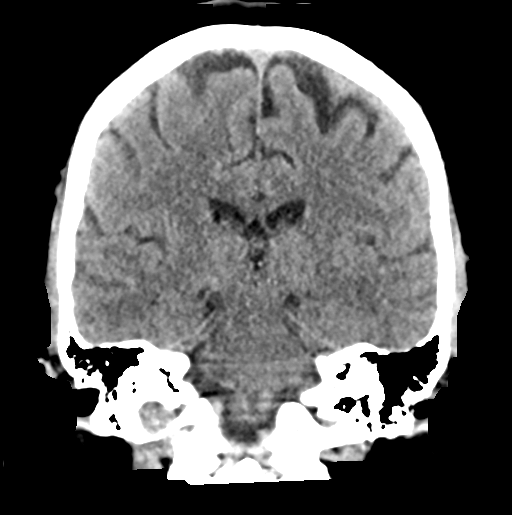
[im 39/71  brain]
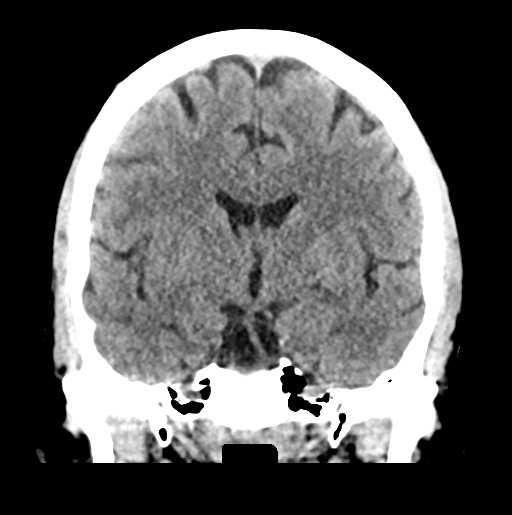

[Series 6: sag soft · sagittal · 0.34mm/px · 3 of 58 slices shown]
[im 20/58  brain]
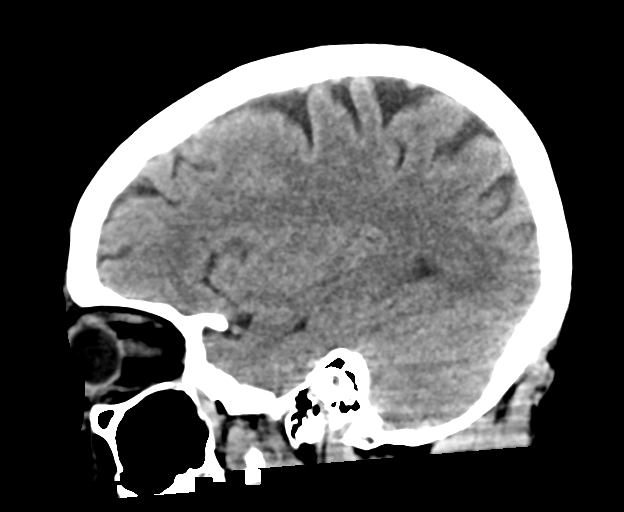
[im 29/58  brain]
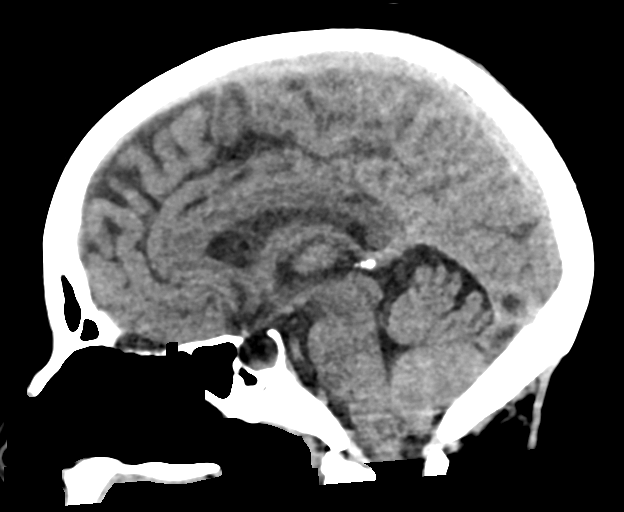
[im 39/58  brain]
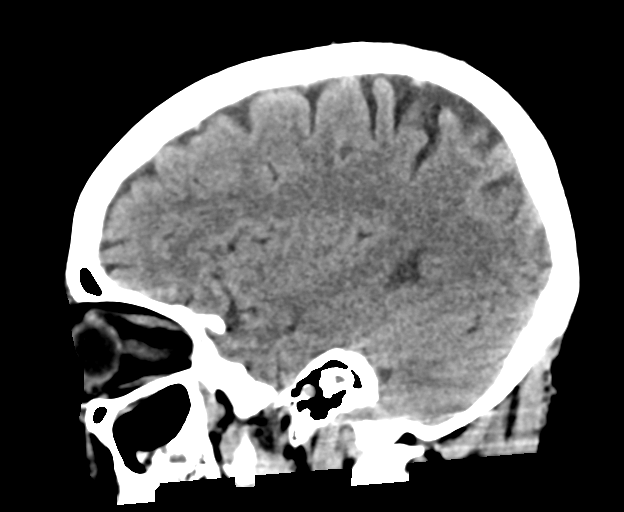

[17 of 47 positions shown; findings below may reference images not displayed]

FINDINGS: Brain: No acute intracranial findings are seen in noncontrast CT
brain. Ventricles are not dilated. Cortical sulci are prominent. No
significant interval changes are noted.

Vascular: Unremarkable.

Skull: Unremarkable.

Sinuses/Orbits: There is mucosal thickening in the right maxillary
sinus.

Other: None
IMPRESSION: No acute intracranial findings are seen in noncontrast CT brain.

Chronic right maxillary sinusitis.

## 2021-07-14 IMAGING — DX DG CHEST 1V PORT
1 series · 1 of 1 positions shown · non-contrast
Comparison: [DATE]

CLINICAL DATA: Facial swelling.

EXAM:
PORTABLE CHEST 1 VIEW

[chest]
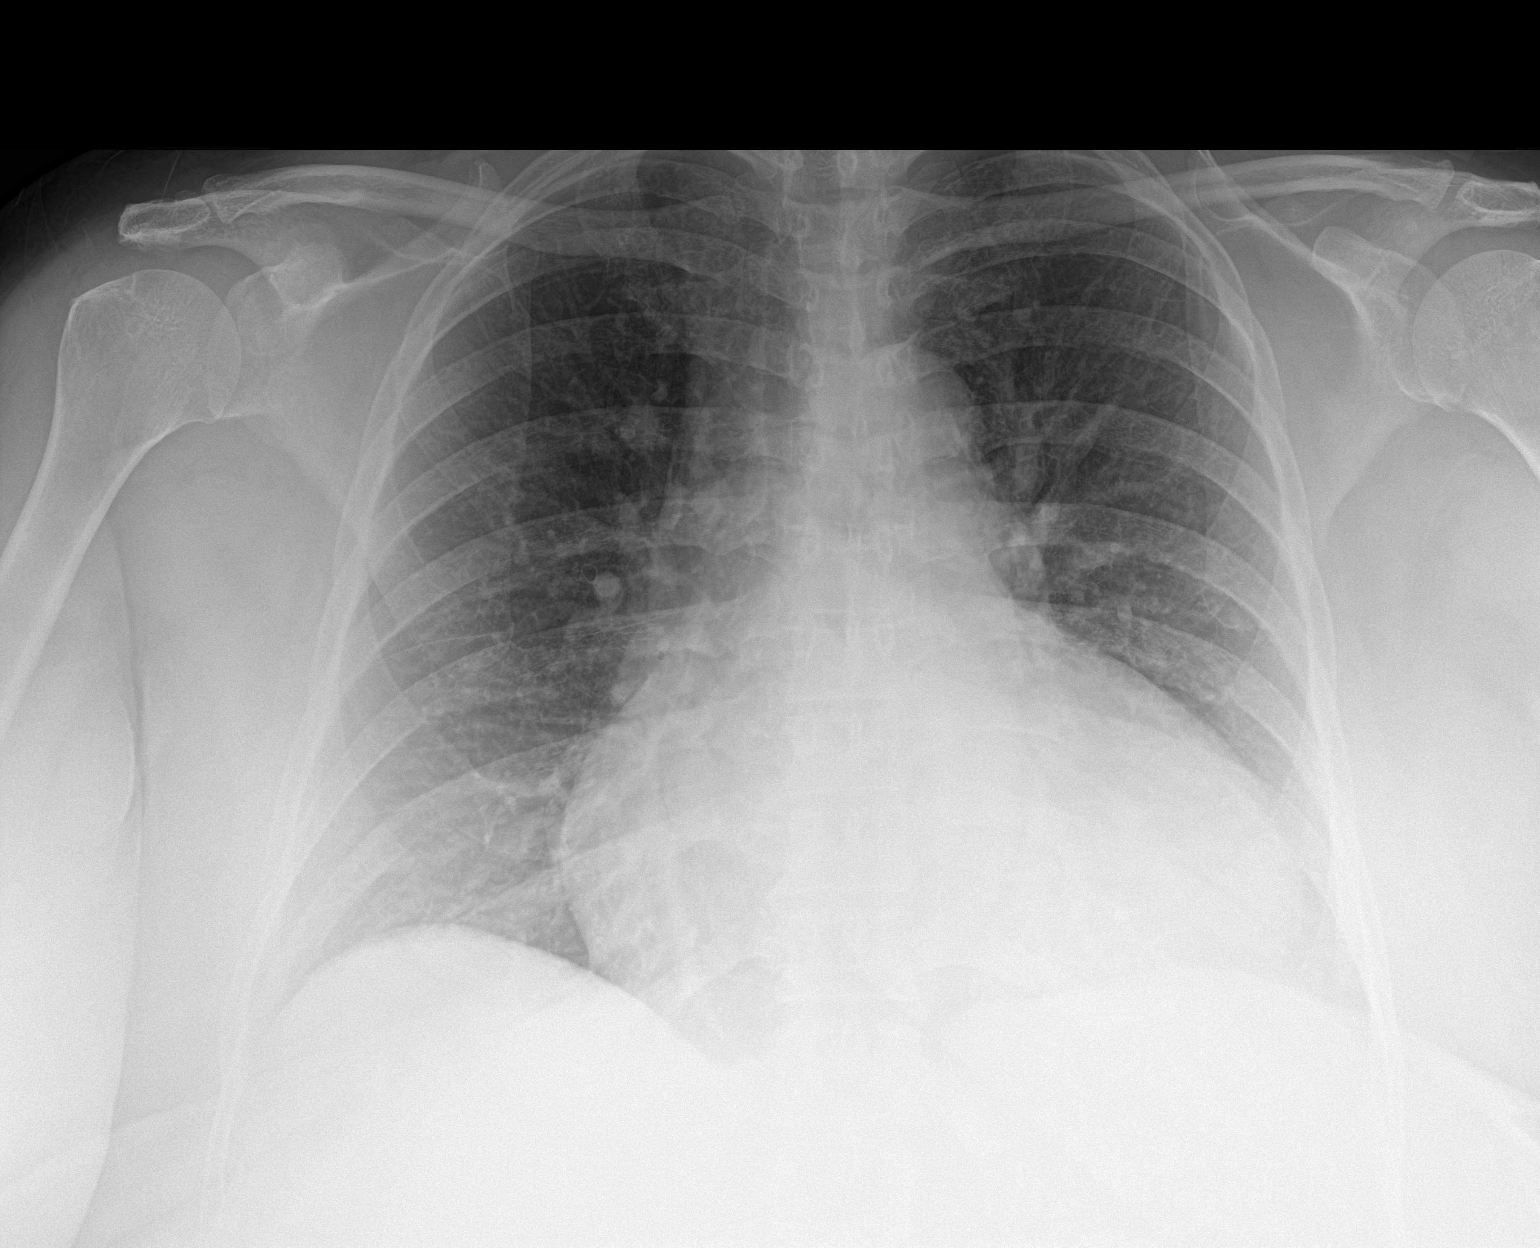

[1 of 1 positions shown; findings below may reference images not displayed]

FINDINGS: The cardiac silhouette is enlarged. Increased prominence in the
interval could be due to the portable technique. The hila and
mediastinum are unremarkable. No pneumothorax. No nodules or masses.
No focal infiltrates or edema.
IMPRESSION: 1. The cardiac silhouette is enlarged. The increased prominence in
the interval could be due to portable technique. Findings likely
represent cardiomegaly. A pericardial effusion could have a similar
appearance.

## 2021-07-14 MED ORDER — LORAZEPAM 1 MG PO TABS
2.0000 mg | ORAL_TABLET | Freq: Once | ORAL | Status: AC
  Administered 2021-07-14: 2 mg via ORAL
  Filled 2021-07-14: qty 2

## 2021-07-14 NOTE — ED Provider Notes (Signed)
[facility] EMERGENCY DEPARTMENT Provider Note   CSN: [id number] Arrival date & time: 07/14/21  1003     History  Chief Complaint  Patient presents with   Numbness    Bianca Medina is a 71 y.o. female.  Pt is a 71 yo female presenting for left facial numbness/tingling x weeks. States symptoms are constant. Denies pain. Denies hx of CVAs, difficulty speaking, slurred speech, extremity weakness or numbness. Denies hx of low calcium. Denies hx camping/tick bites. Denies pain or hx of trigeminal neuralgia. Denies dental pain or tooth infections.   The history is provided by the patient. No language interpreter was used.      Home Medications Prior to Admission medications   Medication Sig Start Date End Date Taking? Authorizing Provider  Bioflavonoid Products (BIOFLEX PO) Take 1 tablet by mouth daily.    [provider]  Biotin 5000 MCG CAPS Take 5,000 mcg by mouth daily.     [provider]  blood glucose meter kit and supplies KIT Dispense based on patient and insurance preference. Use up to 3 times a day as indicated. 04/30/21   Libby Maw, MD  chlorthalidone (HYGROTON) 25 MG tablet Take 1 tablet (25 mg total) by mouth daily. 06/11/21   Libby Maw, MD  esomeprazole (NEXIUM) 40 MG capsule Take 40 mg by mouth daily.     [provider]  fluticasone (FLONASE) 50 MCG/ACT nasal spray Place 2 sprays into both nostrils daily. 05/13/21   Libby Maw, MD  Iron, Ferrous Sulfate, 325 (65 Fe) MG TABS Take one tablet every other day. 06/11/21   Libby Maw, MD  Loratadine 10 MG CHEW     [provider]  metFORMIN (GLUCOPHAGE-XR) 500 MG 24 hr tablet TAKE 1 TABLET BY MOUTH EVERY DAY WITH BREAKFAST 07/14/21   Libby Maw, MD  montelukast (SINGULAIR) 10 MG tablet Take 10 mg by mouth daily. Patient not taking: Reported on 06/11/2021 11/14/20   [provider]  PROAIR HFA 108 [phone] Base)  MCG/ACT inhaler Inhale 1-2 puffs into the lungs every 4 (four) hours as needed for wheezing or shortness of breath.  03/16/18   [provider]  SYMBICORT 80-4.5 MCG/ACT inhaler Inhale into the lungs. 11/16/20   [provider]  triamcinolone cream (KENALOG) 0.1 % Apply 1 application topically 2 (two) times daily. Use the smallest amount possible for no more than 7 days 04/01/21   Michela Pitcher, NP  Vitamin D, Ergocalciferol, (DRISDOL) 1.25 MG (50000 UNIT) CAPS capsule Take 1 capsule (50,000 Units total) by mouth every 7 (seven) days. 06/11/21   Libby Maw, MD      Allergies    Codeine    Review of Systems   Review of Systems  Constitutional:  Negative for chills and fever.  HENT:  Negative for ear pain and sore throat.   Eyes:  Negative for pain and visual disturbance.  Respiratory:  Negative for cough and shortness of breath.   Cardiovascular:  Negative for chest pain and palpitations.  Gastrointestinal:  Negative for abdominal pain and vomiting.  Genitourinary:  Negative for dysuria and hematuria.  Musculoskeletal:  Negative for arthralgias and back pain.  Skin:  Negative for color change and rash.  Neurological:  Positive for numbness. Negative for seizures, syncope, facial asymmetry and weakness.       Left sided facial paraesthesias    All other systems reviewed and are negative.  Physical Exam Updated Vital Signs  BP (!) 164/78 (BP Location: Right Arm)    Pulse 93    Temp 98.6 F (37 C) (Oral)    Resp 18    SpO2 97%  Physical Exam Vitals and nursing note reviewed.  Constitutional:      General: She is not in acute distress.    Appearance: She is well-developed.  HENT:     Head: Normocephalic and atraumatic.  Eyes:     General: Lids are normal. Vision grossly intact.     Conjunctiva/sclera: Conjunctivae normal.     Pupils: Pupils are equal, round, and reactive to light.  Cardiovascular:     Rate and Rhythm: Normal rate and regular rhythm.      Heart sounds: No murmur heard. Pulmonary:     Effort: Pulmonary effort is normal. No respiratory distress.     Breath sounds: Normal breath sounds.  Abdominal:     Palpations: Abdomen is soft.     Tenderness: There is no abdominal tenderness.  Musculoskeletal:        General: No swelling.     Cervical back: Neck supple.  Skin:    General: Skin is warm and dry.     Capillary Refill: Capillary refill takes less than 2 seconds.  Neurological:     Mental Status: She is alert and oriented to person, place, and time.     GCS: GCS eye subscore is 4. GCS verbal subscore is 5. GCS motor subscore is 6.     Cranial Nerves: Cranial nerves 2-12 are intact.     Sensory: Sensation is intact.     Motor: Motor function is intact.     Coordination: Coordination is intact.     Gait: Gait is intact.  Psychiatric:        Mood and Affect: Mood normal.    ED Results / Procedures / Treatments   Labs (all labs ordered are listed, but only abnormal results are displayed) Labs Reviewed  CBC - Abnormal; Notable for the following components:      Result Value   WBC 12.1 (*)    MCV 76.4 (*)    MCH 25.4 (*)    All other components within normal limits  BASIC METABOLIC PANEL - Abnormal; Notable for the following components:   Glucose, Bld 123 (*)    Calcium 8.6 (*)    All other components within normal limits  TSH  TROPONIN I (HIGH SENSITIVITY)    EKG None  Radiology No results found.  Procedures Procedures    Medications Ordered in ED Medications - No data to display  ED Course/ Medical Decision Making/ A&P Clinical Course as of 07/14/21 2025  Mon Jul 14, 2021  1213 Pt reports claustrophobia and concerns for MRI. Ativan ordered.  [AG]    Clinical Course User Index [AG] Lianne Cure, DO                           Medical Decision Making Amount and/or Complexity of Data Reviewed Labs: ordered. Radiology: ordered.  Risk Prescription drug management.   12:11 PM 71 yo female  presenting for left facial numbness/tingling x weeks.   Denies difficulty speaking, slurred speech, extremity weakness or numbness. MRI ordered for concerns for stroke-like symptoms.  Denies hx of low calcium. Calcium levels pending.  Denies hx camping/tick bites. No forehead involvement. Bells palsy unlikely. No paralysis on exam.   Denies pain or hx of trigeminal neuralgia. No facial pain.   Denies  dental pain. No poor dentition on exam.  Pt signed out to oncoming physician Dr. Vanita Panda while awaiting MRI.         Final Clinical Impression(s) / ED Diagnoses Final diagnoses:  Numbness    Rx / DC Orders ED Discharge Orders     None         Lianne Cure, DO [birth year] [phone]

## 2021-07-14 NOTE — Discharge Instructions (Signed)
As discussed, your evaluation today has been largely reassuring.  But, it is important that you monitor your condition carefully, and do not hesitate to return to the ED if you develop new, or concerning changes in your condition. ? ?Otherwise, please follow-up with your physician for appropriate ongoing care. ? ?

## 2021-07-14 NOTE — ED Notes (Signed)
Patient transported to CT 

## 2021-07-14 NOTE — ED Triage Notes (Signed)
Patient coming from home, complaint of numbness to left side of face for 1 week. Seen at [facility] for same yesterday.

## 2021-07-14 NOTE — ED Notes (Signed)
Patient transported to MRI 

## 2021-07-14 NOTE — ED Provider Notes (Signed)
6:21 PM The patient is awake, alert, in no distress.  I reviewed her findings, no notable abnormalities aside from mild leukocytosis, no evidence for new stroke, x-ray without evidence for pneumonia, patient discharged in stable condition.  She notes that she has a physician with whom she can follow-up, is agreeable to calling tomorrow for this   Carmin Muskrat, MD 07/14/21 [facility]

## 2021-07-14 NOTE — ED Notes (Signed)
MRI called for screening questions, patient reports claustrophobia, MRI aware, MD made aware.

## 2021-07-16 ENCOUNTER — Other Ambulatory Visit: Payer: Self-pay | Admitting: Family Medicine

## 2021-07-16 ENCOUNTER — Ambulatory Visit (INDEPENDENT_AMBULATORY_CARE_PROVIDER_SITE_OTHER): Payer: Medicare PPO | Admitting: Gastroenterology

## 2021-07-16 ENCOUNTER — Encounter: Payer: Self-pay | Admitting: Gastroenterology

## 2021-07-16 VITALS — BP 160/84 | HR 82 | Ht 65.0 in | Wt 268.0 lb

## 2021-07-16 DIAGNOSIS — K219 Gastro-esophageal reflux disease without esophagitis: Secondary | ICD-10-CM

## 2021-07-16 DIAGNOSIS — K625 Hemorrhage of anus and rectum: Secondary | ICD-10-CM

## 2021-07-16 DIAGNOSIS — K5900 Constipation, unspecified: Secondary | ICD-10-CM

## 2021-07-16 DIAGNOSIS — R159 Full incontinence of feces: Secondary | ICD-10-CM | POA: Diagnosis not present

## 2021-07-16 DIAGNOSIS — M533 Sacrococcygeal disorders, not elsewhere classified: Secondary | ICD-10-CM

## 2021-07-16 MED ORDER — POLYETHYLENE GLYCOL 3350 17 G PO PACK: PACK | ORAL | 0 refills | Status: DC

## 2021-07-16 MED ORDER — SUTAB 1479-225-188 MG PO TABS: 1.0000 | ORAL_TABLET | Freq: Once | ORAL | 0 refills | Status: DC

## 2021-07-16 NOTE — Progress Notes (Signed)
\  HPI :  71 year old female with a history of rectal pain, altered bowel habits, asthma, chronic microcytosis, here for new patient assessment seeking a second opinion for ongoing rectal pain and altered bowel habits.  She states she has been having "rectal" pain for the past 3 years or so.  When asked to localize this it appears to be over her coccyx at the top of her gluteal cleft.  She denies any anal pain specifically.  This tends to hurt when she is sitting down, that is the only time it bothers her.  If she is standing, walking, using the bathroom this does not bother her.  She denies any pain with her bowel movements.  She does state however when she is sitting and has the pain, she will often see a "wet spot" in her pants, as if she is leaking something out of her.  She does have occasional blood with her bowel movements, usually only on the toilet paper, states it is "light".  She also endorses a sense of constipation with incomplete evacuation.  She does not use any bowel regimen at baseline, uses Dulcolax as needed.  She would normally have a bowel movement 2 to 3 days/week.  She does not think she strains.  She thinks she has used some Linzess in the past but stopped it years ago.  She has not tried MiraLAX, at least recently.  Adding to her worsening constipation was that she was started on iron pills a few weeks ago.  In looking at her labs she has had a chronic microcytosis with MCV in the 70s but her hemoglobin has been normal.  An iron panel done in recent weeks was normal.  She states that iron has made her constipation worse and she does not like the way it makes her feel.  I see that she had a colonoscopy with Dr. Collene Mares back in 2012.  She reports she had one in July 2017 but we do not have the report of that.  She has been on Nexium for more than 10 years for chronic reflux symptoms.  She states if she misses any dosing of it she will have quick recurrence of symptoms and feels  poorly.  She denies any dysphagia.  She eats well, no nausea or vomiting.  No prandial abdominal pain.  She denies any known family history of colon cancer or gastric, or esophageal cancer in first-degree family members.  Aunt or uncle may have had colon cancer.  Seen by [facility] in 12/2020.  They recommended colonoscopy at that time but she not follow-up for it.  Chronic microcytosis - Hgb 03/19/21 was 12.4 with MCV of 77, iron studies at that time showed ferritin of 26, iron 57, TIBC 319, iron sat 18%  Labs CBC 07/14/21 - Hgb 12.4  Seen in the ED 07/14/21 for facial numbness - had MRI brain which was normal, no CVA. CT head also without acute pathology. Has chronic sinusitis  Colonoscopy 08/04/2010 - Dr. Collene Mares - 3 small rectal polyps - hyperplastic polyps  Nuclear stress test 02/26/2017 - EF 55%, low risk test  CT abdomen with and without contrast 07/25/2018: IMPRESSION: 1. No pancreatic lesion or peripancreatic lesion identified. 2. Abnormality described on comparison ultrasound is favored a pseudo lesion presumably representing a loop of bowel. 3. No lymphadenopathy. 4. Benign LEFT adrenal adenoma.    Past Medical History:  Diagnosis Date   Adenomatous colon polyp 2021   Anemia 2003   IRON DEFICIENCY  Asthma    Chronic anal fissure    Common migraine 06/23/2019   Diabetes mellitus without complication (HCC)    GERD (gastroesophageal reflux disease)    Hearing loss    Hyperlipidemia, mild    Hypertension    IDA (iron deficiency anemia)    NSVD (normal spontaneous vaginal delivery)    X3   Obesity    PMDD (premenstrual dysphoric disorder)    Sensorineural hearing loss (SNHL) of both ears    Sleep disorder      Past Surgical History:  Procedure Laterality Date   ABDOMINAL HYSTERECTOMY     BLADDER REPAIR     COLONOSCOPY  11/2019   1 hpp and 1 tubl adenoma   GUM SURGERY  1980's   MANDIBLE FRACTURE SURGERY     TUBAL LIGATION     Family History  Problem Relation  Age of Onset   Cancer Mother        LUNG- SMOKER   Diabetes Father    Hypertension Father    Heart disease Father    Stroke Father    Cancer Paternal Aunt        COLON   Cancer Paternal Uncle        COLON   Breast cancer Sister    Social History   Tobacco Use   Smoking status: Never   Smokeless tobacco: Never  Vaping Use   Vaping Use: Never used  Substance Use Topics   Alcohol use: Not Currently   Drug use: Never   Current Outpatient Medications  Medication Sig Dispense Refill   Bioflavonoid Products (BIOFLEX PO) Take 1 tablet by mouth daily.     Biotin 5000 MCG CAPS Take 5,000 mcg by mouth daily.      esomeprazole (NEXIUM) 40 MG capsule Take 40 mg by mouth daily.      fluticasone (FLONASE) 50 MCG/ACT nasal spray Place 2 sprays into both nostrils daily. 16 g 6   Iron, Ferrous Sulfate, 325 (65 Fe) MG TABS Take one tablet every other day. 45 tablet 2   metFORMIN (GLUCOPHAGE-XR) 500 MG 24 hr tablet TAKE 1 TABLET BY MOUTH EVERY DAY WITH BREAKFAST 90 tablet 1   PROAIR HFA 108 (90 Base) MCG/ACT inhaler Inhale 1-2 puffs into the lungs every 4 (four) hours as needed for wheezing or shortness of breath.      SYMBICORT 80-4.5 MCG/ACT inhaler Inhale into the lungs.     Vitamin D, Ergocalciferol, (DRISDOL) 1.25 MG (50000 UNIT) CAPS capsule Take 1 capsule (50,000 Units total) by mouth every 7 (seven) days. 15 capsule 1   No current facility-administered medications for this visit.   Allergies  Allergen Reactions   Codeine Other (See Comments)    nightmares Other reaction(s): Other nightmares nightmares     Review of Systems: All systems reviewed and negative except where noted in HPI.   Lab Results  Component Value Date   WBC 12.1 (H) 07/14/2021   HGB 12.4 07/14/2021   HCT 37.3 07/14/2021   MCV 76.4 (L) 07/14/2021   PLT 250 07/14/2021    CBC Latest Ref Rng & Units 07/14/2021 05/04/2021 03/19/2021  WBC 4.0 - 10.5 K/uL 12.1(H) 10.4 8.4  Hemoglobin 12.0 - 15.0 g/dL 12.4  12.5 12.4  Hematocrit 36.0 - 46.0 % 37.3 38.5 38.7  Platelets 150 - 400 K/uL 250 269 233.0    Lab Results  Component Value Date   IRON 57 03/19/2021   TIBC 319 03/19/2021   FERRITIN 26 03/19/2021  Lab Results  Component Value Date   ALT 18 05/04/2021   AST 18 05/04/2021   ALKPHOS 89 05/04/2021   BILITOT 0.7 05/04/2021    Lab Results  Component Value Date   CREATININE 0.79 07/14/2021   BUN 15 07/14/2021   NA 140 07/14/2021   K 4.0 07/14/2021   CL 104 07/14/2021   CO2 29 07/14/2021     Physical Exam: BP (!) 160/84    Pulse 82    Ht 5\' 5"  (1.651 m)    Wt 268 lb (121.6 kg)    BMI 44.60 kg/m  Constitutional: Pleasant,well-developed, female in no acute distress. HEENT: Normocephalic and atraumatic. Conjunctivae are normal. No scleral icterus. Neck supple.  Cardiovascular: Normal rate, regular rhythm.  Pulmonary/chest: Effort normal and breath sounds normal. No wheezing, rales or rhonchi. Abdominal: Soft, nondistended, nontender. There are no masses palpable. No hepatomegaly. Anoscopy / DRE - [facility] Alert as standby - no fissure, hemorrhoids noted on anoscopy, moderate in size, no mass lesions Extremities: no edema Lymphadenopathy: No cervical adenopathy noted. Neurological: Alert and oriented to person place and time. Skin: Skin is warm and dry. No rashes noted. Psychiatric: Normal mood and affect. Behavior is normal.   ASSESSMENT AND PLAN: 71 year old female here for new patient assessment of the following:  Coccydynia Rectal bleeding Chronic constipation Fecal leakage GERD  Discussed all of these issues with her.  Her primary concern is pain which appears to be coming from her coccyx.  I suspect she may have coccydynia causing her symptoms.  She does not have any rectal pain.  She does have an orthopedic surgeon she sees at [facility] clinic for other back pain, I recommend she follow-up with them for assessment of this.  I think she may need an imaging of  her coccyx but not sure what she has had done recently for her lower back otherwise and if this was viewed.  I defer to them for management of this issue but reassured her.  Otherwise she does have chronic constipation with intermittent rectal bleeding, occasional fecal leakage.  I suspect is likely due to hemorrhoids, however given her leakage in regards to her coccygeal pain, I offered her a colonoscopy to make sure no interval changes since her last exam.  She strongly wanted to proceed with that.  For management of her constipation, recommend daily bowel regimen.  She has not really tried higher dose MiraLAX.  Recommend 1 double dose daily and increase as needed.  She wants to avoid stronger regimen if possible, if MiraLAX does not work we can retry McGregor or other.  Recent iron supplementation has made her constipation worse.  I do not see any clear iron deficiency on labs and think may be okay to stop that but recommend she talk with her primary care first.  I wonder if she may have underlying thalassemia given chronic microcytosis.  We reviewed her reflux history, she has required moderate dose PPI over many years to control her symptoms.  Meets criteria for Barrett's screening with endoscopy.  I offered her an EGD to be done at the same time as her colonoscopy to screen for this.  We discussed endoscopy, colonoscopy, anesthesia and risks of each, she wants to proceed with both.  Her asthma is under good control currently.  Plan: - follow up with orthopedics [facility] for coccydynia - schedule EGD and colon  - I think can stop oral iron based on recent iron studies but she will discuss  further with PCP - start Miralax double dose daily and titrate up or down as needed - if miralax doesn't work will try Linzess or other - continue omeprazole for now  Jolly Mango, MD [facility]  CC: Libby Maw,*

## 2021-07-16 NOTE — Patient Instructions (Addendum)
If you are age 70 or older, your body mass index should be between 23-30. Your Body mass index is 44.6 kg/m?Marland Kitchen If this is out of the aforementioned range listed, please consider follow up with your Primary Care Provider. ? ?If you are age 76 or younger, your body mass index should be between 19-25. Your Body mass index is 44.6 kg/m?Marland Kitchen If this is out of the aformentioned range listed, please consider follow up with your Primary Care Provider.  ? ?________________________________________________________ ? ?The [facility] providers would like to encourage you to use [facility] to communicate with providers for non-urgent requests or questions.  Due to long hold times on the telephone, sending your provider a message by [facility] may be a faster and more efficient way to get a response.  Please allow 48 business hours for a response.  Please remember that this is for non-urgent requests.  ?_______________________________________________________ ? ? ?You have been scheduled for a EGD/colonoscopy. Please follow written instructions given to you at your visit today.  ?Please pick up your prep supplies at the pharmacy within the next 1-3 days. ?If you use inhalers (even only as needed), please bring them with you on the day of your procedure. ? ?Stop oral iron. ? ?Please purchase the following medications over the counter and take as directed: ?Miralax: Take a double dose (2 capfuls) once daily.  Titrate up as needed to have regular bowel movements. Especially several days prior to your procedure, make sure you are moving your bowels well by using double dose of Miralax daily. ? ?We will refer you to [facility]. They will contact you to schedule an appointment.  ? ?Thank you for entrusting me with your care and for choosing [employer], ?Dr. Ellendale Cellar ? ? ? ? ?

## 2021-07-17 ENCOUNTER — Telehealth: Payer: Self-pay | Admitting: Gastroenterology

## 2021-07-17 NOTE — Telephone Encounter (Signed)
I spoke with Bianca Medina and reassured her that the pill prep is as effective as the liquid prep. I have also put her on the wait list to get a sooner ECL appointment. ?

## 2021-07-17 NOTE — Telephone Encounter (Signed)
Patient called seeking advise if the pills that she has for her prep will be affective like the liquid prep. Please advise.  ?

## 2021-07-22 ENCOUNTER — Other Ambulatory Visit: Payer: Self-pay

## 2021-07-22 ENCOUNTER — Telehealth: Payer: Self-pay

## 2021-07-22 MED ORDER — SUTAB 1479-225-188 MG PO TABS: 1.0000 | ORAL_TABLET | Freq: Once | ORAL | 0 refills | Status: AC

## 2021-07-22 NOTE — Telephone Encounter (Signed)
Called pt to follow up on call from earlier today. Pt stated that she was sure that she will be able to find a care partner to bring her for her procedures on 07/24/21. She is aware that she will need to arrive at 2 pm. Pt states that she has her instructions but she does not have the prep. I told her that the prep was sent to [facility] on 07/16/21. Pt stated that she thinks she may have a message from them, she is just getting back in town and will check. I told her to call us back and let us know if she is not able to get the prep in time from [facility]. Pt did not need new instructions, she will adjust according to new appt date/time. Pt verbalized understanding of all information and had no concerns at the end of the call. ?

## 2021-07-22 NOTE — Telephone Encounter (Signed)
Contacted [facility]. They recommend that script to sent to local pharmacy as they don't overnight. Sent script to patient's local [facility] along with activated Medicare coupon codes. Called and informed patient. All questions answered about prep times and arrival times, etc. Patient understands she will need to go on clear liquids tomorrow and start taking Sutab tomorrow night at 6pm. Patient will review instructions and call if she has any more questions. ?

## 2021-07-22 NOTE — Telephone Encounter (Signed)
Thank you very much for coordinating on short notice! ?

## 2021-07-22 NOTE — Telephone Encounter (Signed)
Dr. Havery Moros has had a cancellation for a double procedure on 07/24/21 in the [place]. I called and offered this appt to the patient. She stated that she would call to check to see if she would have a ride for that appt and would call me back within the next hour. ?

## 2021-07-22 NOTE — Telephone Encounter (Signed)
Noted, thanks!

## 2021-07-23 ENCOUNTER — Telehealth: Payer: Self-pay | Admitting: Gastroenterology

## 2021-07-23 NOTE — Telephone Encounter (Signed)
Appt has been added to wait list.  ?New instructions sent to pt via my chart and mailed.  ?

## 2021-07-23 NOTE — Telephone Encounter (Signed)
Sorry to hear this, if BP poorly controlled agree she needs to get that addressed with her primary care. ?Herbert Seta can you please take her off the schedule and put her back on the wait list for double procedure?  Thanks ?

## 2021-07-23 NOTE — Telephone Encounter (Signed)
Patient walked into the office wanting to reschedule tomorrow's EGD/Colon. She states that she doesn't feel well. That she feels like her blood pressure is too high. Patient reschedule procedure to 09/08/21. I informed patient that she needs to contact her primary care provider to discuss her blood pressure.  ? ?Bianca Medina-Do you mind mailing out new prep instructions to the patient? Thank you.  ?

## 2021-07-24 ENCOUNTER — Encounter: Payer: Medicare PPO | Admitting: Gastroenterology

## 2021-07-25 ENCOUNTER — Telehealth: Payer: Self-pay

## 2021-07-25 NOTE — Telephone Encounter (Signed)
Scheduled for 07/28/21. FYI.  ?

## 2021-07-25 NOTE — Telephone Encounter (Signed)
Pt calling to reports continued troubles with left breast and requesting referral to [facility] in [facility]. Pt notified that our providers would like her to come in for breast exam before sending referrals. Pt notified and voiced understanding. I will send scheduling a msg to call pt.  ?

## 2021-07-28 ENCOUNTER — Ambulatory Visit: Payer: Medicare PPO | Admitting: Obstetrics & Gynecology

## 2021-07-29 ENCOUNTER — Ambulatory Visit: Payer: Medicare PPO | Admitting: Family Medicine

## 2021-07-29 NOTE — Telephone Encounter (Signed)
Pt cancelled and r/s for 08/27/21.  ?

## 2021-07-31 ENCOUNTER — Telehealth: Payer: Self-pay | Admitting: Gastroenterology

## 2021-07-31 NOTE — Telephone Encounter (Signed)
Records received from most recent colonoscopy: ? ?Colonoscopy 12/09/2015 - Dr. Collene Mares - adequate prep - 41m sigmoid polyp, small distal transverse polyp - path showed one adenoma / mucosal neuroma, hyperplastic polyp ? ?Will await her to reschedule her exams with [facility] [place] BP is better controlled ?

## 2021-08-01 ENCOUNTER — Emergency Department (HOSPITAL_COMMUNITY)
Admission: EM | Admit: 2021-08-01 | Discharge: 2021-08-01 | Discharge status: Against Medical Advice | Payer: Medicare PPO | Attending: Emergency Medicine | Admitting: Emergency Medicine

## 2021-08-01 ENCOUNTER — Encounter (HOSPITAL_COMMUNITY): Payer: Self-pay

## 2021-08-01 ENCOUNTER — Other Ambulatory Visit: Payer: Self-pay

## 2021-08-01 DIAGNOSIS — R200 Anesthesia of skin: Secondary | ICD-10-CM | POA: Insufficient documentation

## 2021-08-01 DIAGNOSIS — Z7984 Long term (current) use of oral hypoglycemic drugs: Secondary | ICD-10-CM | POA: Insufficient documentation

## 2021-08-01 DIAGNOSIS — Z79899 Other long term (current) drug therapy: Secondary | ICD-10-CM | POA: Diagnosis not present

## 2021-08-01 DIAGNOSIS — E119 Type 2 diabetes mellitus without complications: Secondary | ICD-10-CM | POA: Diagnosis not present

## 2021-08-01 DIAGNOSIS — I10 Essential (primary) hypertension: Secondary | ICD-10-CM | POA: Diagnosis not present

## 2021-08-01 DIAGNOSIS — Q670 Congenital facial asymmetry: Secondary | ICD-10-CM

## 2021-08-01 DIAGNOSIS — R202 Paresthesia of skin: Secondary | ICD-10-CM | POA: Diagnosis not present

## 2021-08-01 DIAGNOSIS — H538 Other visual disturbances: Secondary | ICD-10-CM | POA: Diagnosis not present

## 2021-08-01 NOTE — ED Provider Notes (Signed)
?[facility] ?Provider Note ? ? ?CSN: [id number] ?Arrival date & time: 08/01/21  1005 ? ?  ? ?History ? ?No chief complaint on file. ? ? ?Bianca Medina is a 71 y.o. female with diabetes mellitus, hypertension, hyperlipidemia, migraine.  Presents emergency department with a chief complaint of left-sided facial numbness/tingling and facial asymmetry.  Patient reports that she has been dealing with left-sided facial numbness and tingling intermittently over the past few weeks.  Patient reports that this morning at 2 to 2:30 AM she woke up and noticed worsening tingling and numbness to the left side of her face as well as some new facial asymmetry to the left side of her face and blurry vision to bilateral eyes.  Patient states that she is continuing to have left-sided numbness and tingling however it has improved.  Reports that the numbness radiates into the left side of her neck.  Patient reports that her facial asymmetry has resolved.  Is unable to say how long the facial asymmetry lasted for.  Patient denies any recent falls or injuries.  Patient denies any neck pain today.  States that she did have a pain to the left side of her neck yesterday however that resolved without medications. ? ?Denies any fever, chills, weakness, dysarthria, lightheadedness, syncope, seizures, headaches, dizziness, tremors. ? ?HPI ? ?  ? ?Home Medications ?Prior to Admission medications   ?Medication Sig Start Date End Date Taking? Authorizing Provider  ?Bioflavonoid Products (BIOFLEX PO) Take 1 tablet by mouth daily.    [provider]  ?Biotin 5000 MCG CAPS Take 5,000 mcg by mouth daily.     [provider]  ?esomeprazole (NEXIUM) 40 MG capsule Take 40 mg by mouth daily.     [provider]  ?fluticasone (FLONASE) 50 MCG/ACT nasal spray Place 2 sprays into both nostrils daily. 05/13/21   Libby Maw, MD  ?metFORMIN (GLUCOPHAGE-XR) 500 MG 24 hr tablet TAKE 1  TABLET BY MOUTH EVERY DAY WITH BREAKFAST 07/14/21   Libby Maw, MD  ?polyethylene glycol [facility]) 17 g packet Take a double dose once daily and titrate as needed 07/16/21   Armbruster, Carlota Raspberry, MD  ?[facility] HFA [phone] Base) MCG/ACT inhaler Inhale 1-2 puffs into the lungs every 4 (four) hours as needed for wheezing or shortness of breath.  03/16/18   [provider]  ?SYMBICORT 80-4.5 MCG/ACT inhaler Inhale into the lungs. 11/16/20   [provider]  ?Vitamin D, Ergocalciferol, (DRISDOL) 1.25 MG (50000 UNIT) CAPS capsule Take 1 capsule (50,000 Units total) by mouth every 7 (seven) days. 06/11/21   Libby Maw, MD  ?   ? ?Allergies    ?Codeine   ? ?Review of Systems   ?Review of Systems  ?Constitutional:  Negative for chills and fever.  ?Eyes:  Positive for visual disturbance.  ?Respiratory:  Negative for shortness of breath.   ?Cardiovascular:  Negative for chest pain.  ?Gastrointestinal:  Negative for abdominal pain, nausea and vomiting.  ?Musculoskeletal:  Negative for back pain and neck pain.  ?Skin:  Negative for color change and rash.  ?Neurological:  Positive for facial asymmetry and numbness. Negative for dizziness, tremors, seizures, syncope, speech difficulty, weakness, light-headedness and headaches.  ?Psychiatric/Behavioral:  Negative for confusion.   ? ?Physical Exam ?Updated Vital Signs ?BP 135/60   Pulse 64   Temp 98.7 ?F (37.1 ?C) (Oral)   Resp 15   Ht '5\' 5"'$  (1.651 m)   Wt 121.6 kg  SpO2 99%   BMI 44.61 kg/m?  ?Physical Exam ?Vitals and nursing note reviewed.  ?Constitutional:   ?   General: She is not in acute distress. ?   Appearance: She is not ill-appearing, toxic-appearing or diaphoretic.  ?Eyes:  ?   General: No visual field deficit.    ?   Right eye: No discharge.     ?   Left eye: No discharge.  ?   Extraocular Movements: Extraocular movements intact.  ?   Conjunctiva/sclera: Conjunctivae normal.  ?   Pupils: Pupils are equal, round, and reactive to  light.  ?Cardiovascular:  ?   Rate and Rhythm: Normal rate.  ?Pulmonary:  ?   Effort: Pulmonary effort is normal.  ?Musculoskeletal:  ?   Cervical back: Normal range of motion and neck supple. No rigidity.  ?Skin: ?   General: Skin is warm and dry.  ?Neurological:  ?   General: No focal deficit present.  ?   Mental Status: She is alert.  ?   GCS: GCS eye subscore is 4. GCS verbal subscore is 5. GCS motor subscore is 6.  ?   Cranial Nerves: No cranial nerve deficit, dysarthria or facial asymmetry.  ?   Motor: No weakness, tremor, seizure activity or pronator drift.  ?   Coordination: Finger-Nose-Finger Test normal.  ?   Gait: Gait is intact. Gait normal.  ?   Comments: CN II-XII intact, equal grip strength, +5 strength to bilateral upper and lower extremities, sensation to light touch grossly intact to bilateral upper and lower extremities  ?Psychiatric:     ?   Behavior: Behavior is cooperative.  ? ? ?ED Results / Procedures / Treatments   ?Labs ?(all labs ordered are listed, but only abnormal results are displayed) ?Labs Reviewed - No data to display ? ?EKG ?None ? ?Radiology ?No results found. ? ?Procedures ?Procedures  ? ? ?Medications Ordered in ED ?Medications - No data to display ? ?ED Course/ Medical Decision Making/ A&P ?  ?                        ?Medical Decision Making ? ?Alert 71 year old female no acute distress, nontoxic-appearing.  Presents emergency department with a chief complaint of left-sided facial numbness, left-sided facial asymmetry, and blurred vision. ? ?Information obtained from patient.  Past medical records were reviewed including previous provider notes, labs, and imaging.  Patient has past medical history as outlined in HPI which complicates her care. ? ?Patient reports that she noticed her symptoms upon waking at 2 to 2:30 AM this morning.  Last known normal was yesterday evening before going to sleep.  Patient is outside the stroke window at this time.  On my exam patient has no  focal neurological deficits, neuro exam is reassuring. ? ?Per chart review patient has been dealing with left-sided facial numbness and tingling for multiple weeks.  Had work-up on 2/27 including MRI which was unremarkable. ? ?Due to new reports of facial asymmetry and blurred vision we will consult neurology prior to obtaining any imaging or labs. ? ?I spoke to Dr. Leonel Ramsay who recommended getting an MRI and MRA of head and neck to evaluate for acute CVA. ? ?Patient is refusing to have any MRI procedures performed.  Patient states "I just cannot do this today."  Patient was offered antianxiety medication to help with the procedure however continues to refuse MRI.  I discussed that without these procedures we cannot rule out a  stroke.  If stroke is present and left untreated could lead to new deficits, worsening of deficits, lifelong deficits, and complications including but not limited to death.  Patient expresses understanding and continues to refuse MRI imaging AGAINST MEDICAL ADVICE.Marland Kitchen  This conversation was witnessed by patient's nurse Lysbeth Galas. ? ?I discussed getting a CT scan of her head to evaluate for acute brain bleed.  Patient again refuses this medication AGAINST MEDICAL ADVICE. ? ?Patient will leave [place].  Advised patient to follow-up with her neurologist. ? ? ? ? ? ? ? ?Final Clinical Impression(s) / ED Diagnoses ?Final diagnoses:  ?Facial asymmetry  ?Numbness and tingling of left side of face  ? ? ?Rx / DC Orders ?ED Discharge Orders   ? ? None  ? ?  ? ? ?  ?Loni Beckwith, PA-C ?08/01/21 1151 ? ?  ?Jeanell Sparrow, DO ?08/02/21 6384 ? ?  ?Jeanell Sparrow, DO ?08/02/21 [phone] ? ?

## 2021-08-01 NOTE — Discharge Instructions (Signed)
You came to the emergency department today to be evaluated for your facial asymmetry, blurred vision, and left-sided facial numbness.  It was recommended that you obtain an MRI to evaluate for a stroke.  You refused this procedure AGAINST MEDICAL ADVICE.  Please follow-up with your neurologist. ? ?Get help right away if you: ?Feel muscle weakness. ?Develop new weakness in an arm or leg. ?Have trouble walking or moving. ?Have problems with speech, understanding, or vision. ?Feel confused. ?Cannot control your bladder or bowel movements. ?

## 2021-08-01 NOTE — ED Triage Notes (Signed)
Pt arrives POV with complaints of increasing left sided neck pain and numbness and tingling in her mouth and lips. This has been going on for a couple weeks but pt does not remember what she was told at discharge the last time she was here. AOx4 no distress noted. ?

## 2021-08-04 ENCOUNTER — Ambulatory Visit (INDEPENDENT_AMBULATORY_CARE_PROVIDER_SITE_OTHER): Payer: Medicare PPO | Admitting: Neurology

## 2021-08-04 ENCOUNTER — Encounter: Payer: Self-pay | Admitting: Neurology

## 2021-08-04 VITALS — BP 131/75 | HR 98 | Ht 65.0 in | Wt 262.5 lb

## 2021-08-04 DIAGNOSIS — R200 Anesthesia of skin: Secondary | ICD-10-CM | POA: Diagnosis not present

## 2021-08-04 DIAGNOSIS — G478 Other sleep disorders: Secondary | ICD-10-CM

## 2021-08-04 DIAGNOSIS — H02402 Unspecified ptosis of left eyelid: Secondary | ICD-10-CM | POA: Diagnosis not present

## 2021-08-04 NOTE — Patient Instructions (Addendum)
Facial numbness, facial swelling - subjective.  ?Normal MRI and normal CMET.  ?Possible Thyroid disorder to be addressed by PCP.  ?

## 2021-08-04 NOTE — Telephone Encounter (Signed)
Pt will f/u sleep and migraine with Dr Brett Fairy, since established with Dr Brett Fairy in [place] 2022.  ?

## 2021-08-04 NOTE — Progress Notes (Signed)
? ? ? ?SLEEP MEDICINE CLINIC ?  ? ?Provider:  Larey Seat, MD  ?Primary Care Physician:  Libby Maw, MD ?[place] ?[place] [address]  ? ?  ?Referring Provider:  ?Libby Maw, Md ?[place] ?[place],  [place] [address]  ?  ?  ?    ?Chief Complaint according to patient   ?Patient presents with:  ?  ? New Problem for  Sleep Medicine Clinic Patient, ED referral  08-04-2021.  ?     ?  ?  ?HISTORY OF PRESENT ILLNESS:  08-04-2021 ?Bianca Medina is a 71 y.o. AA female patient and seen here as a referral on 08/04/2021 , this time for a new problem of facial numbness, and she reports a slow progression of facial droop on the left side of the face.  ?This is not present here today in PM, and she has not made an appointment with her PCP.  ?She reports that the swelling the puffiness and the facial asymmetry is most noticeable in the morning.  I have noticed that she has more ptosis on the left than the right.  The left upper eyelid does straddles the pupil.  There may be a slight asymmetry in her smile that the nasal labial fold is actually better pronounced on the left side. ?She was first presenting to ED on 07-14-2021, had an MRI brain.  ?I quote from her second emergency visit on 02 August 2021 when the patient was seen at [facility] emergency room.  For some reason an EKG was done it was interpreted as showing possibly left ventricular hypertrophy.  The patient presents as a patient with diabetes mellitus, hypertension, hyperlipidemia, migraine history and with a chief complaint of left side left-sided facial numbness tingling and face facial asymmetry.  The left-sided facial numbness and tingling has been intermittent over the past weeks but on 17 March she woke up at 2 AM and noticed worsening in numbness or tingling of the left side.  She also felt that the vision was blurred but this affected both eyes.  By the time she was seen in the emergency  room it was 10/5 and her symptoms had improved she reports that the numbness radiates into the left side of her neck but there was no facial asymmetry noticed by the attending physician.  His evaluation shows no cranial nerve deficits no dysarthria or facial asymmetry.  There was also no extremity abnormality noted equal strength, normal range of motion and no abnormal extremity sensory.   ? ? ?A work-up on 2 on February 27 included an MRI this was her first emergency room visit and was unremarkable.  No evidence of stroke or demyelination there was no bleeding brain bleed or tumor. ?COMPARISON:  Same-day noncontrast CT head, MR head 07/20/2019 ?  ?FINDINGS: ?Brain: There is no evidence of acute intracranial hemorrhage, ?extra-axial fluid collection, or acute infarct ?  ?Parenchymal volume is normal. The ventricles are normal in size. ?Parenchymal signal is normal for age, with no significant burden of ?white matter microangiopathic change ?  ?There is no suspicious parenchymal signal abnormality. There is no ?mass lesion. There is no midline shift. The sella is mildly expanded ?and partially empty, unchanged. ?  ?Vascular: Normal flow voids. ?  ?Skull and upper cervical spine: Normal marrow signal. ?  ?Sinuses/Orbits: There is mucosal thickening in the right maxillary ?sinus with associated hyperostosis. The globes and orbits are ?unremarkable. ?  ?Other: There is  a small right mastoid effusion. ?  ?IMPRESSION: ?Normal appearance of the brain with no acute intracranial pathology. ?  ? ? ? ? ? ? ? ? ?She was just seen in September 2022, at the time upon transfer of care ( indirectly per sleep consult ) from Dr Jannifer Franklin for a sleep consultation , in relation to Headache . ? ?Chief concern according to patient :  ' only sometimes do I wake with a headache, not severe, and once I take tylenol its goes away immediately. There is no nausea, no photophobia and no focal point that hurts. " ?She never got the HST , no follow  up from sleep consult.  ?I have the pleasure of seeing Bianca Medina today, a right -handed Black or [place] American female with a possible sleep disorder.  She   has a past medical history of Anemia (2003), Asthma, Common migraine (06/23/2019), Diabetes mellitus without complication ([place]), GERD (gastroesophageal reflux disease), Hyperlipidemia, mild, Hypertension, NSVD (normal spontaneous vaginal delivery), PMDD (premenstrual dysphoric disorder), morbid obesity. Vertigo recently, tinnitus- on meclizine.  ?Mrs. Probus had undergone a sleep study on December 12, 2013 performed at [facility], under the guidance of Dr. Baird Lyons.  She had a really poor sleep efficiency the total recording time was 412 minutes and the total sleep time 174 minutes so this was 42.3% indicating insomnia.  For the time that he actually slept she had an AHI of 11/h these were all hypopneas no frank apneas were seen.  She was described as snoring.  All hypopneas clustered in rem sleep.  This is most likely a manifestation of obesity hypoventilation and her lowest oxygen saturation was 80% SPO2 she spent 1.8 minutes with a oxygen saturation less than 88%.  She remained on room air at the course her apnea was so mild she was not split into a CPAP titration at night.  Her neurologic exam was entirely normal.  She carried a diagnosis of allergic rhinitis insulin resistance, perimenstrual mood disorder and morbid obesity.   ? ?Today's Epworth sleepiness score was endorsed at only 5 points out of 24 points in stark contrast to her Epworth score at the time of her sleep study from 7 years ago.  Then she was excessively daytime sleepy but she also attributed this to stress at her job. ? ?She has since retired. Her blood pressure has been better controlled, but was still need to medicate.  ?PHQ indicated still depression to be present .  ? ?  ?  ?Sleep relevant medical history: Nocturia 2 times,  Tonsillectomy-none ,  wisdom teeth  removed.  Wore a brace and retainers, mouth guard , bruxism.  Gasping for air. GERD ?  ? Family medical /sleep history: no  other family member with bruxism, but sister with insomnia, nobody on CPAP with OSA, insomnia.  ?  ?Social history:  Patient is retired from office job, Engineer, maintenance (IT)-  and lives in a household alone. Family status is single , Pets are not  present.Tobacco use;never .  ETOH use ; none , Caffeine intake in form of Coffee( /) Soda( 1-2 a week) or Tea ( 1-2 / week) or energy drinks. Regular exercise in form of walking , non -swimmer.   ?Hobbies :arts and crafts.  ? ?  ?  ?Sleep habits are as follows: The patient's dinner time is between 5-6 PM. The patient goes to bed at 11-12 PM and continues to sleep for intervals of 3 hours, wakes for 1-2  bathroom breaks, the first time at 2 AM.   ?The preferred sleep position is supine or side , with the support of one thick  pillows.  ?Dreams are reportedly infrequent.  ?Variable   AM is the usual rise time. The patient wakes up spontaneously. ?She reports not feeling refreshed or restored in AM, with symptoms such as dry mouth, uncommon morning headaches, and residual fatigue. Naps are taken frequently, lasting from 1-1.5  hours  and are refreshing .  ?  ?Review of Systems: ?Out of a complete 14 system review, the patient complains of only the following symptoms, and all other reviewed systems are negative.:  ?Fatigue, sleepiness , snoring, fragmented sleep, Insomnia is a frequent occurrence , 4-5 hours of sleep only.  ?Waking up and not going back to sleep.  ?  ?How likely are you to doze in the following situations: ?0 = not likely, 1 = slight chance, 2 = moderate chance, 3 = high chance ?  ?Sitting and Reading? ?Watching Television? ?Sitting inactive in a public place (theater or meeting)? ?As a passenger in a car for an hour without a break? ?Lying down in the afternoon when circumstances permit? ?Sitting and talking to someone? ?Sitting quietly after lunch  without alcohol? ?In a car, while stopped for a few minutes in traffic? ?  ?Total = 5/ 24 points  ? FSS endorsed at 14/ 63 points.  ? ?PHQ 9 of 4 points.  ? ?Social History  ? ?Socioeconomic History  ? [address]

## 2021-08-06 ENCOUNTER — Other Ambulatory Visit: Payer: Self-pay | Admitting: Orthopedic Surgery

## 2021-08-06 DIAGNOSIS — M5416 Radiculopathy, lumbar region: Secondary | ICD-10-CM | POA: Diagnosis not present

## 2021-08-07 ENCOUNTER — Telehealth: Payer: Self-pay | Admitting: Neurology

## 2021-08-07 DIAGNOSIS — G510 Bell's palsy: Secondary | ICD-10-CM | POA: Diagnosis not present

## 2021-08-07 LAB — COMPREHENSIVE METABOLIC PANEL
ALT: 15 IU/L (ref 0–32)
AST: 14 IU/L (ref 0–40)
Albumin/Globulin Ratio: 1.3 (ref 1.2–2.2)
Albumin: 3.9 g/dL (ref 3.8–4.8)
Alkaline Phosphatase: 120 IU/L (ref 44–121)
BUN/Creatinine Ratio: 26 (ref 12–28)
BUN: 22 mg/dL (ref 8–27)
Bilirubin Total: 0.2 mg/dL (ref 0.0–1.2)
CO2: 30 mmol/L — ABNORMAL HIGH (ref 20–29)
Calcium: 9.6 mg/dL (ref 8.7–10.3)
Chloride: 102 mmol/L (ref 96–106)
Creatinine, Ser: 0.84 mg/dL (ref 0.57–1.00)
Globulin, Total: 2.9 g/dL (ref 1.5–4.5)
Glucose: 116 mg/dL — ABNORMAL HIGH (ref 70–99)
Potassium: 4.1 mmol/L (ref 3.5–5.2)
Sodium: 141 mmol/L (ref 134–144)
Total Protein: 6.8 g/dL (ref 6.0–8.5)
eGFR: 75 mL/min/{1.73_m2} (ref >59)

## 2021-08-07 LAB — THYROID PANEL WITH TSH
Free Thyroxine Index: 1.8 (ref 1.2–4.9)
T3 Uptake Ratio: 27 % (ref 24–39)
T4, Total: 6.8 ug/dL (ref 4.5–12.0)
TSH: 0.757 u[IU]/mL (ref 0.450–4.500)

## 2021-08-07 LAB — ANA,IFA RA DIAG PNL W/RFLX TIT/PATN
ANA Titer 1: NEGATIVE
Cyclic Citrullin Peptide Ab: 2 units (ref 0–19)
Rheumatoid fact SerPl-aCnc: <10 IU/mL (ref <14.0)

## 2021-08-07 LAB — ACETYLCHOLINE RECEPTOR, BINDING: AChR Binding Ab, Serum: 0.03 nmol/L (ref 0.00–0.24)

## 2021-08-07 NOTE — Telephone Encounter (Signed)
Called the pt back and advised that the lab results have come back in normal range. Pt verbalized understanding. ?She asked why was she scheduled with a stroke specialist in 3 months. I had to do some digging to determine what she was referring to. The patient was scheduled for a 3-4 month follow up as requested by Dr Dohmeier with Janett Billow our NP. I advised that she follows with some of Dr Dohmeier's patients and advised that it wasn't necessarily for stroke related purposes as it is following up in general post Sleep study along with f/u from visit. Pt verbalized understanding. ? ?

## 2021-08-07 NOTE — Progress Notes (Signed)
Negative metabolic panel, ANA, RF and thyroid panel.

## 2021-08-07 NOTE — Telephone Encounter (Signed)
-----   Message from Larey Seat, MD sent at 08/07/2021  8:26 AM EDT ----- ?Negative metabolic panel, ANA, RF and thyroid panel.  ?

## 2021-08-08 DIAGNOSIS — I10 Essential (primary) hypertension: Secondary | ICD-10-CM | POA: Diagnosis not present

## 2021-08-08 DIAGNOSIS — E119 Type 2 diabetes mellitus without complications: Secondary | ICD-10-CM | POA: Diagnosis not present

## 2021-08-08 DIAGNOSIS — R200 Anesthesia of skin: Secondary | ICD-10-CM | POA: Diagnosis not present

## 2021-08-08 DIAGNOSIS — E785 Hyperlipidemia, unspecified: Secondary | ICD-10-CM | POA: Diagnosis not present

## 2021-08-18 ENCOUNTER — Other Ambulatory Visit: Payer: Medicare PPO

## 2021-08-19 ENCOUNTER — Ambulatory Visit: Payer: Medicare PPO | Admitting: Radiology

## 2021-08-19 ENCOUNTER — Encounter: Payer: Self-pay | Admitting: Radiology

## 2021-08-19 VITALS — BP 122/76

## 2021-08-19 DIAGNOSIS — R102 Pelvic and perineal pain: Secondary | ICD-10-CM | POA: Diagnosis not present

## 2021-08-19 LAB — URINALYSIS, COMPLETE W/RFL CULTURE
Bacteria, UA: NONE SEEN /HPF
Bilirubin Urine: NEGATIVE
Glucose, UA: NEGATIVE
Hgb urine dipstick: NEGATIVE
Hyaline Cast: NONE SEEN /LPF
Ketones, ur: NEGATIVE
Leukocyte Esterase: NEGATIVE
Nitrites, Initial: NEGATIVE
Protein, ur: NEGATIVE
RBC / HPF: NONE SEEN /HPF (ref 0–2)
Specific Gravity, Urine: 1.025 (ref 1.001–1.035)
WBC, UA: NONE SEEN /HPF (ref 0–5)
pH: 5.5 (ref 5.0–8.0)

## 2021-08-19 LAB — NO CULTURE INDICATED

## 2021-08-19 NOTE — Progress Notes (Signed)
? ? ? ? ?  Bianca Medina is 71 y.o. female presenting with complaint of  pelvic cramping x's 1 week---increased Sunday, noticed blood with wiping last week, also with constipation (has taken miralax and laxatives x's last couple weeks). Hx TAH/BSO ? ? ?Subjective: ?Location of Pain: lower abdomen  ?Does pain radiate? no ?Pain started gradually  ?Prior h/o similar pain? no ? ? ?Objective:  ?Pelvic exam: VULVA: normal appearing vulva with no masses, tenderness or lesions, VAGINA: normal appearing vagina with normal color and discharge, no lesions, atrophic, PELVIC FLOOR EXAM: no cystocele, rectocele or prolapse noted, cystocele grade 1, CERVIX: surgically absent, UTERUS: surgically absent, vaginal cuff well healed, ADNEXA: surgically absent bilateral,  Large amount of stool present in the colon and rectum as felt from the vaginal ?RECTAL: declined by the patient. ? ?Urine dipstick shows negative for all components.  Micro exam: negative for WBC's or RBC's.  ? ?Chaperone offered and declined for exam. ? ?Assessment/Plan: ?1. Pelvic pain ? ?Reassured normal pelvic exam and UA ?Likely GI related, will schedule with her GI provider for further workup ? ?- Urinalysis,Complete w/RFL Culture  ?

## 2021-08-27 ENCOUNTER — Encounter: Payer: Self-pay | Admitting: Obstetrics & Gynecology

## 2021-08-27 ENCOUNTER — Ambulatory Visit: Payer: Medicare PPO | Admitting: Obstetrics & Gynecology

## 2021-08-27 VITALS — BP 140/72

## 2021-08-27 DIAGNOSIS — N644 Mastodynia: Secondary | ICD-10-CM

## 2021-08-27 NOTE — Progress Notes (Signed)
? ? ?  Bianca Medina 1951-02-22 [id number] ? ? ?     71 y.o.  G3P3L3  ? ?RP: Intermittent left breast pain worsening x last year ? ?HPI: Intermittent left breast pain worsening x last year.  Experiences the external left breast pain at night.  Patient is trying to change her sleeping habits and sleep on the right side.  No breast lump felt.  No change in skin, no nipple d/c. Mammo 09/2020 Benign.  No fam Hx of Breast Ca.  Recent h/o Left face asymmetry probably d/t a stroke. ? ? ?OB History  ?Gravida Para Term Preterm AB Living  ?'3 3       3  '$ ?SAB IAB Ectopic Multiple Live Births  ?           ?  ?# Outcome Date GA Lbr Len/2nd Weight Sex Delivery Anes PTL Lv  ?3 Para           ?2 Para           ?1 Para           ? ? ?Past medical history,surgical history, problem list, medications, allergies, family history and social history were all reviewed and documented in the EPIC chart. ? ? ?Directed ROS with pertinent positives and negatives documented in the history of present illness/assessment and plan. ? ?Exam: ? ?Vitals:  ? 08/27/21 1126  ?BP: 140/72  ? ?General appearance:  Normal ? ?Breast exam:  Right breast:  Normal breast with no nodule or mass, NT.  Normal skin.  No nipple d/c.  No Rt Axillary LN felt. ?                       Left breast:  Tender breast at 2 O'Clock.  No nodule or mass felt.  Normal skin.  No nipple d/c.  No Lt Axillary LN felt. ? ? ?Assessment/Plan:  71 y.o. G3P3  ? ?1. Pain of left breast  ?Intermittent left breast pain worsening x last year.  Experiences the external left breast pain at night.  Patient is trying to change her sleeping habits and sleep on the right side.  No breast lump felt.  No change in skin, no nipple d/c. Mammo 09/2020 Benign.  No fam Hx of Breast Ca. Breast exam: Right breast:  Normal breast with no nodule or mass, NT.  Normal skin.  No nipple d/c.  No Rt Axillary LN felt.  Left breast:  Tender breast at 2 O'Clock.  No nodule or mass felt.  Normal skin.  No nipple d/c.  No Lt  Axillary LN felt.  Counseling on Breast tenderness and management discussed.  Patient reassured that no nodule or mass were felt on exam.  We will further investigate with a Left Dx Mammo and Lt Breast US. ? ?Counseling on breast tenderness and pain, documentation reviewed and management discussed for 15 minutes. ? ?Princess Bruins MD, 11:43 AM 08/27/2021 ? ? ? ?  ?

## 2021-08-28 ENCOUNTER — Telehealth: Payer: Self-pay

## 2021-08-28 ENCOUNTER — Encounter: Payer: Self-pay | Admitting: Neurology

## 2021-08-28 ENCOUNTER — Ambulatory Visit: Payer: Medicare PPO | Admitting: Neurology

## 2021-08-28 ENCOUNTER — Telehealth: Payer: Self-pay | Admitting: Adult Health

## 2021-08-28 ENCOUNTER — Other Ambulatory Visit: Payer: Self-pay

## 2021-08-28 ENCOUNTER — Telehealth: Payer: Self-pay | Admitting: Neurology

## 2021-08-28 VITALS — BP 124/62 | HR 54 | Ht 65.0 in | Wt 269.2 lb

## 2021-08-28 DIAGNOSIS — R0683 Snoring: Secondary | ICD-10-CM | POA: Diagnosis not present

## 2021-08-28 DIAGNOSIS — R0689 Other abnormalities of breathing: Secondary | ICD-10-CM

## 2021-08-28 DIAGNOSIS — Z6841 Body Mass Index (BMI) 40.0 and over, adult: Secondary | ICD-10-CM | POA: Diagnosis not present

## 2021-08-28 DIAGNOSIS — N644 Mastodynia: Secondary | ICD-10-CM

## 2021-08-28 NOTE — Telephone Encounter (Signed)
Orders placed in Epic. ? ?Scheduled 09/25/21 at 2:40pm to arrive at 2:20 pm at [facility]. ? ?Left message for patient to call. ?

## 2021-08-28 NOTE — Telephone Encounter (Signed)
Opthalmology referral sent to Dr. Phillip Heal [phone]. ?

## 2021-08-28 NOTE — Telephone Encounter (Signed)
Left Dx mammo/Lt Breast US to schedule ?Received: Yesterday ?Princess Bruins, MD  P [facility] Triage ?Left breast:  Tender breast at 2 O'Clock.  No nodule or mass felt.  Normal skin.  No nipple d/c.  No Lt Axillary LN felt.  ?

## 2021-08-28 NOTE — Patient Instructions (Signed)
Blurred Vision, Adult ?  ?Having blurred vision means that you cannot see things clearly. Your vision may seem fuzzy or out of focus. It can involve your vision for objects that are close or far away. It may affect one or both eyes. There are many causes of blurred vision, including cataracts, macular degeneration, eye inflammation (uveitis), and diabetic retinopathy. ?In many cases, blurred vision has to do with the shape of your eye. An abnormal eye shape means you cannot focus well (refractive error). When this happens, it can cause: ?Faraway objects to look blurry (nearsightedness). ?Close objects to look blurry (farsightedness). ?Blurry vision at any distance (astigmatism). ?Refractive errors are often corrected with glasses or contacts. ?If you have blurred vision, it is best to see an eye care specialist. Blurred vision can be diagnosed based on your symptoms and a complete eye exam. Tell your eye care specialist about any other health problems you have, any recent eye injury, and any prior surgeries. You may need to see an eye care specialist who specializes in medical and surgical eye problems (ophthalmologist). Your treatment will depend on what is causing your blurred vision. ?Follow these instructions at home: ?Keep all follow-up visits. This is important. These include any visits to your eye specialists. ?Do not drive or use machinery if your vision is blurry. ?Use eye drops only as told by your health care provider. ?If you were prescribed glasses or contact lenses, wear the glasses or contacts as told by your health care provider. ?Schedule eye exams regularly. ?Pay attention to any changes in your symptoms. ?Avoid rubbing your eyes. ?Contact a health care provider if: ?Your symptoms do not improve or they get worse. ?You have: ?New symptoms. ?A headache. ?Trouble seeing at night. ?Trouble noticing the difference between colors. ?You notice: ?Drooping of your eyelids. ?Drainage coming from your  eyes. ?A rash around your eyes. ?Get help right away if: ?You have: ?Severe eye pain. ?A severe headache. ?A sudden change in vision. ?A sudden loss of vision. ?A vision change after an injury. ?You notice flashing lights in your field of vision. Your field of vision is the area that you can see without moving your eyes. ?Summary ?Having blurred vision means that you cannot see things clearly. Your vision may seem fuzzy or out of focus. ?There are many causes of blurred vision. In many cases, blurred vision has to do with an abnormal eye shape (refractive error), and it can be corrected with glasses or contact lenses. ?Pay attention to any changes in your symptoms. Contact a health care provider if your symptoms do not improve or if you have any new symptoms. ?This information is not intended to replace advice given to you by your health care provider. Make sure you discuss any questions you have with your health care provider. ?Document Revised: 09/03/2020 Document Reviewed: 09/03/2020 ?Elsevier Patient Education ? [place]. ? ?

## 2021-08-28 NOTE — Progress Notes (Signed)
? ? ? ?SLEEP MEDICINE CLINIC ?  ? ?Provider:  Larey Seat, MD  ?Primary Care Physician:  Libby Maw, MD ?[place] ?[place] [address]  ? ?  ?Referring Provider:  ?Libby Maw, Md ?[facility] ?[place],  [place] [address]  ?  ?  ?    ?Chief Complaint according to patient   ?Patient presents with:  ?  ? New Problem for  Sleep Medicine Clinic Patient, ED referral  08-04-2021.  ?     ?  ?  ?HISTORY OF PRESENT ILLNESS:   ? ? ?08-04-2021 ?Bianca Medina is a 71 y.o. AA female patient and seen here as a walk- in  self referral on 08/28/2021 , we just met 08-04-2021, and Bianca Medina reports that she still has a feeling of her left eye being swollen and and under pressure, she feels that it is protruding.  ? ?She still has not made an appointment with her PCP.   ? ?About the eyelid as well the upper and lower of the left eye are heavier puffy.  She also reports numbness and there may be a slight droop of the left angle of the mouth.  What I see from the outside however is a very symmetric shape of the eye normal extraocular movements, normal facial appearance.  After our last visit I had sent for a rheumatology panel which came back negative.  She feels however that she has some blurred vision to the left eye. Goldman scale resolution of 20/40 with corrective glasses.  ?last labs were showing high glucose , but this was a non fasting sample/  negative ANA and negative Ach receptor Ab.  ?She has seen ophthalmologist: Dr. Laury Axon, MD. In [place]. ?  ? Not on Epic- no records located,. He is an optometrist...  ? ? ? ?Bianca Medina is a 71 y.o. AA female patient and seen here as ED referred patient , but she has not seen PCP - 08-05-2018 , this time for a new problem of facial numbness, and she reports a slow progression of facial droop on the left side of the face.  ?This is not present here today in PM, and she has not made an appointment with her PCP.  ?She reports  that the swelling the puffiness and the facial asymmetry is most noticeable in the morning.  I have noticed that she has more ptosis on the left than the right.  The left upper eyelid does straddles the pupil.  There may be a slight asymmetry in her smile that the nasal labial fold is actually better pronounced on the left side. ?She was first presenting to ED on 07-14-2021, had an MRI brain.  ?I quote from her second emergency visit on 02 August 2021 when the patient was seen at [facility] emergency room.  For some reason an EKG was done it was interpreted as showing possibly left ventricular hypertrophy.  The patient presents as a patient with diabetes mellitus, hypertension, hyperlipidemia, migraine history and with a chief complaint of left side left-sided facial numbness tingling and face facial asymmetry.  The left-sided facial numbness and tingling has been intermittent over the past weeks but on 17 March she woke up at 2 AM and noticed worsening in numbness or tingling of the left side.  She also felt that the vision was blurred but this affected both eyes.  By the time she was seen in the emergency room it  was 10/5 and her symptoms had improved she reports that the numbness radiates into the left side of her neck but there was no facial asymmetry noticed by the attending physician.  His evaluation shows no cranial nerve deficits no dysarthria or facial asymmetry.  There was also no extremity abnormality noted equal strength, normal range of motion and no abnormal extremity sensory.   ? ? ?A work-up on 2 on February 27 included an MRI this was her first emergency room visit and was unremarkable.  No evidence of stroke or demyelination there was no bleeding brain bleed or tumor. ?COMPARISON:  Same-day noncontrast CT head, MR head 07/20/2019 ?  ?FINDINGS: ?Brain: There is no evidence of acute intracranial hemorrhage, ?extra-axial fluid collection, or acute infarct ?  ?Parenchymal volume is  normal. The ventricles are normal in size. ?Parenchymal signal is normal for age, with no significant burden of ?white matter microangiopathic change ?  ?There is no suspicious parenchymal signal abnormality. There is no ?mass lesion. There is no midline shift. The sella is mildly expanded ?and partially empty, unchanged. ?  ?Vascular: Normal flow voids. ?  ?Skull and upper cervical spine: Normal marrow signal. ?  ?Sinuses/Orbits: There is mucosal thickening in the right maxillary ?sinus with associated hyperostosis. The globes and orbits are ?unremarkable. ?  ?Other: There is a small right mastoid effusion. ?  ?IMPRESSION: ?Normal appearance of the brain with no acute intracranial pathology. ?  ? ? ? ? ? ? ? ? ?She was just seen in September 2022, at the time upon transfer of care ( indirectly per sleep consult ) from Dr Jannifer Franklin for a sleep consultation , in relation to Headache . ? ?Chief concern according to patient :  ' only sometimes do I wake with a headache, not severe, and once I take tylenol its goes away immediately. There is no nausea, no photophobia and no focal point that hurts. " ?She never got the HST , no follow up from sleep consult.  ?I have the pleasure of seeing Bianca Medina today, a right -handed Black or [place] American female with a possible sleep disorder.  She   has a past medical history of Anemia (2003), Asthma, Common migraine (06/23/2019), Diabetes mellitus without complication ([place]), GERD (gastroesophageal reflux disease), Hyperlipidemia, mild, Hypertension, NSVD (normal spontaneous vaginal delivery), PMDD (premenstrual dysphoric disorder), morbid obesity. Vertigo recently, tinnitus- on meclizine.  ?Bianca Medina had undergone a sleep study on December 12, 2013 performed at [facility], under the guidance of Dr. Baird Lyons.  She had a really poor sleep efficiency the total recording time was 412 minutes and the total sleep time 174 minutes so this was 42.3% indicating  insomnia.  For the time that he actually slept she had an AHI of 11/h these were all hypopneas no frank apneas were seen.  She was described as snoring.  All hypopneas clustered in rem sleep.  This is most likely a manifestation of obesity hypoventilation and her lowest oxygen saturation was 80% SPO2 she spent 1.8 minutes with a oxygen saturation less than 88%.  She remained on room air at the course her apnea was so mild she was not split into a CPAP titration at night.  Her neurologic exam was entirely normal.  She carried a diagnosis of allergic rhinitis insulin resistance, perimenstrual mood disorder and morbid obesity.   ? ?Today's Epworth sleepiness score was endorsed at only 5 points out of 24 points in stark contrast to her Epworth score at  the time of her sleep study from 7 years ago.  Then she was excessively daytime sleepy but she also attributed this to stress at her job. ? ?She has since retired. Her blood pressure has been better controlled, but was still need to medicate.  ?PHQ indicated still depression to be present .  ? ?  ?  ?Sleep relevant medical history: Nocturia 2 times,  Tonsillectomy-none ,  wisdom teeth removed.  Wore a brace and retainers, mouth guard , bruxism.  Gasping for air. GERD ?  ? Family medical /sleep history: no  other family member with bruxism, but sister with insomnia, nobody on CPAP with OSA, insomnia.  ?  ?Social history:  Patient is retired from office job, Engineer, maintenance (IT)-  and lives in a household alone. Family status is single , Pets are not  present.Tobacco use;never .  ETOH use ; none , Caffeine intake in form of Coffee( /) Soda( 1-2 a week) or Tea ( 1-2 / week) or energy drinks. Regular exercise in form of walking , non -swimmer.   ?Hobbies :arts and crafts.  ? ?  ?  ?Sleep habits are as follows: The patient's dinner time is between 5-6 PM. The patient goes to bed at 11-12 PM and continues to sleep for intervals of 3 hours, wakes for 1-2  bathroom breaks, the first time at 2 AM.    ?The preferred sleep position is supine or side , with the support of one thick  pillows.  ?Dreams are reportedly infrequent.  ?Variable   AM is the usual rise time. The patient wakes up spontaneously. ?She r

## 2021-08-30 ENCOUNTER — Encounter (HOSPITAL_COMMUNITY): Payer: Self-pay | Admitting: Emergency Medicine

## 2021-08-30 ENCOUNTER — Emergency Department (HOSPITAL_COMMUNITY): Payer: Medicare PPO

## 2021-08-30 ENCOUNTER — Other Ambulatory Visit: Payer: Self-pay

## 2021-08-30 ENCOUNTER — Emergency Department (HOSPITAL_COMMUNITY)
Admission: EM | Admit: 2021-08-30 | Discharge: 2021-08-30 | Disposition: A | Discharge status: Home / Self Care | Payer: Medicare PPO | Attending: Emergency Medicine | Admitting: Emergency Medicine

## 2021-08-30 DIAGNOSIS — I10 Essential (primary) hypertension: Secondary | ICD-10-CM | POA: Insufficient documentation

## 2021-08-30 DIAGNOSIS — R0789 Other chest pain: Secondary | ICD-10-CM | POA: Insufficient documentation

## 2021-08-30 DIAGNOSIS — R079 Chest pain, unspecified: Secondary | ICD-10-CM

## 2021-08-30 DIAGNOSIS — Z79899 Other long term (current) drug therapy: Secondary | ICD-10-CM | POA: Insufficient documentation

## 2021-08-30 LAB — BASIC METABOLIC PANEL
Anion gap: 8 (ref 5–15)
BUN: 16 mg/dL (ref 8–23)
CO2: 27 mmol/L (ref 22–32)
Calcium: 8.7 mg/dL — ABNORMAL LOW (ref 8.9–10.3)
Chloride: 105 mmol/L (ref 98–111)
Creatinine, Ser: 0.87 mg/dL (ref 0.44–1.00)
GFR, Estimated: >60 mL/min (ref >60)
Glucose, Bld: 125 mg/dL — ABNORMAL HIGH (ref 70–99)
Potassium: 4 mmol/L (ref 3.5–5.1)
Sodium: 140 mmol/L (ref 135–145)

## 2021-08-30 LAB — CBC
HCT: 38.3 % (ref 36.0–46.0)
Hemoglobin: 12.7 g/dL (ref 12.0–15.0)
MCH: 25.7 pg — ABNORMAL LOW (ref 26.0–34.0)
MCHC: 33.2 g/dL (ref 30.0–36.0)
MCV: 77.4 fL — ABNORMAL LOW (ref 80.0–100.0)
Platelets: 289 10*3/uL (ref 150–400)
RBC: 4.95 MIL/uL (ref 3.87–5.11)
RDW: 14.2 % (ref 11.5–15.5)
WBC: 12.7 10*3/uL — ABNORMAL HIGH (ref 4.0–10.5)
nRBC: 0 % (ref 0.0–0.2)

## 2021-08-30 LAB — TROPONIN I (HIGH SENSITIVITY)
Troponin I (High Sensitivity): 6 ng/L (ref <18)
Troponin I (High Sensitivity): 5 ng/L (ref <18)

## 2021-08-30 IMAGING — CR DG CHEST 2V
2 series · 2 of 2 positions shown · non-contrast
Comparison: [DATE]

CLINICAL DATA: Chest pain

EXAM:
CHEST - 2 VIEW

[chest pa]
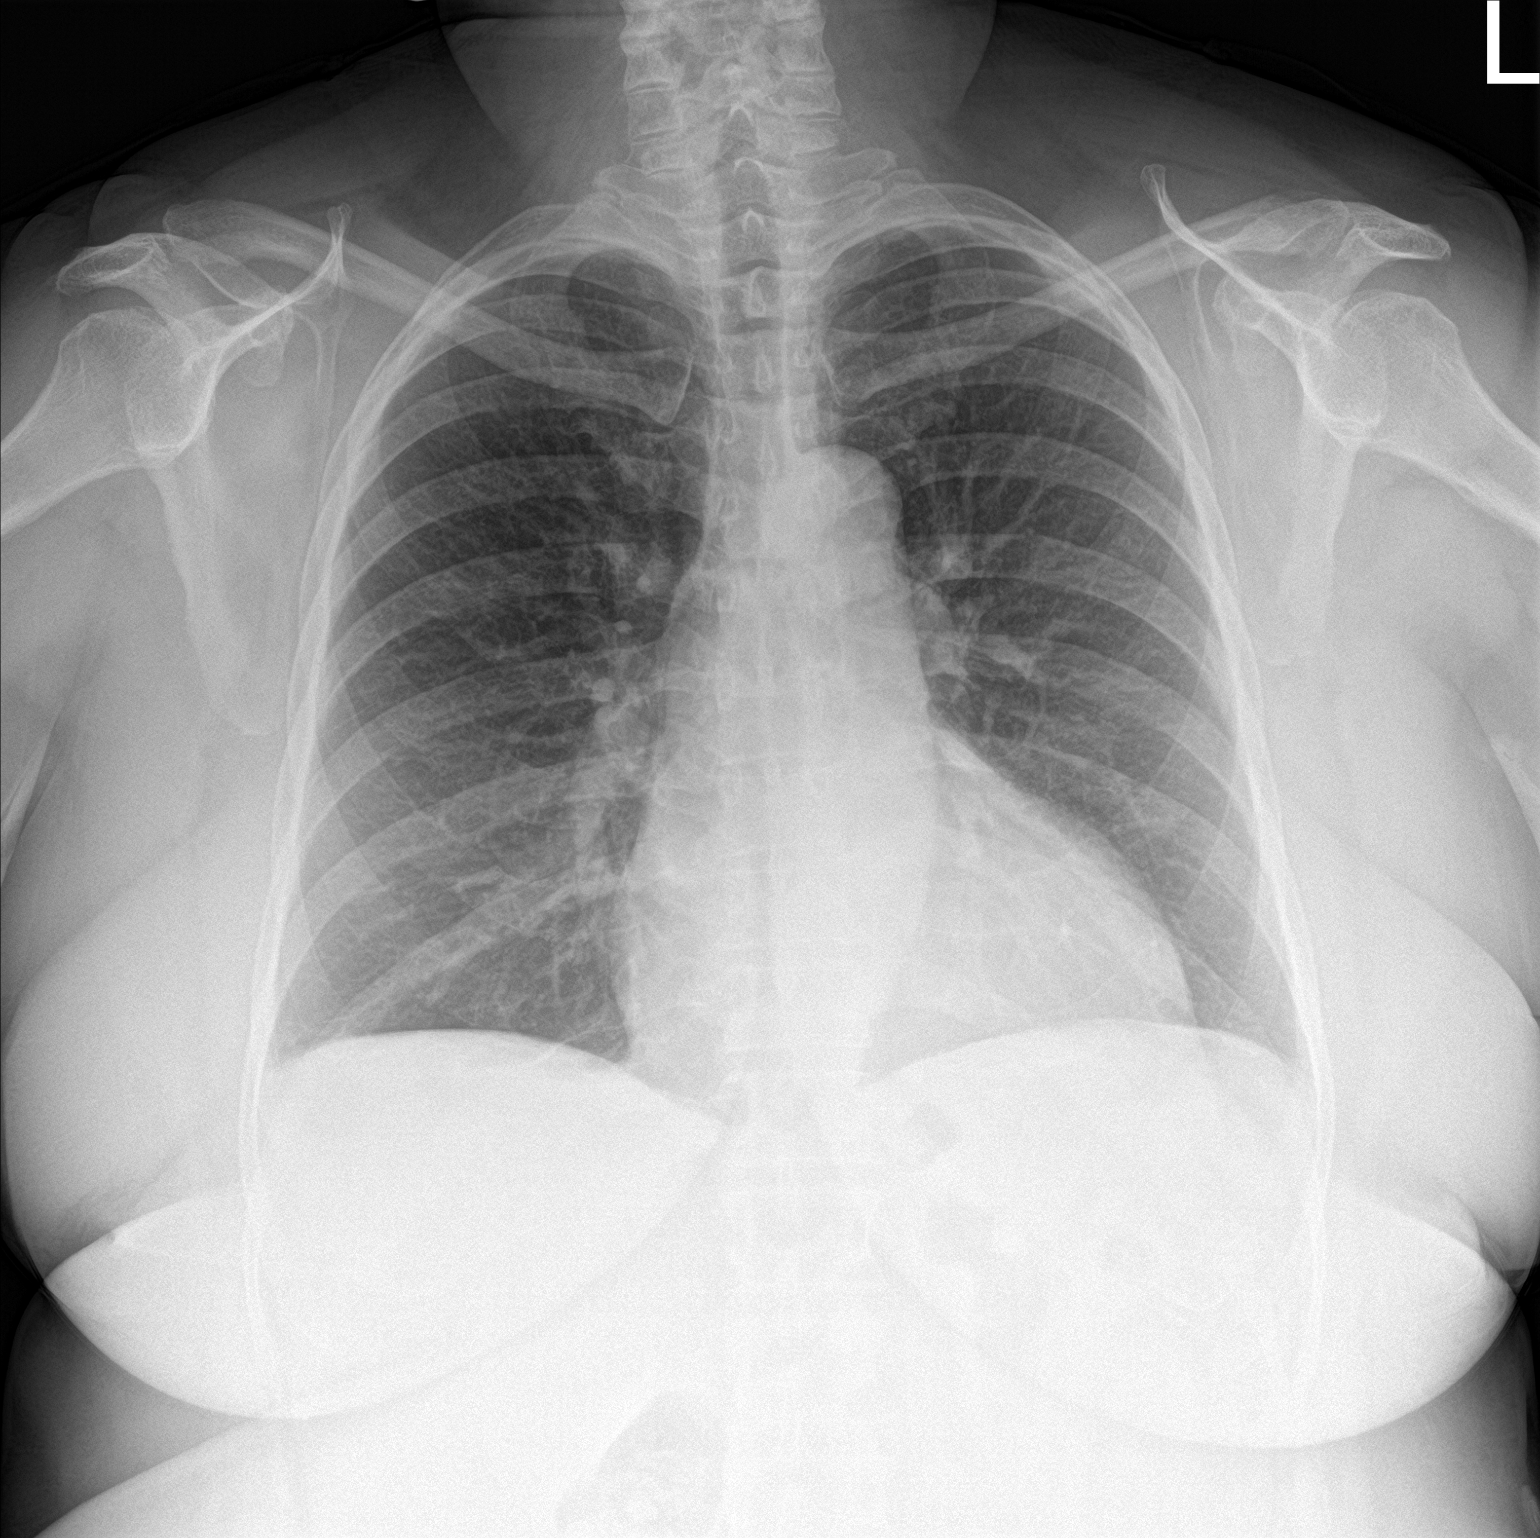

[chest lat]
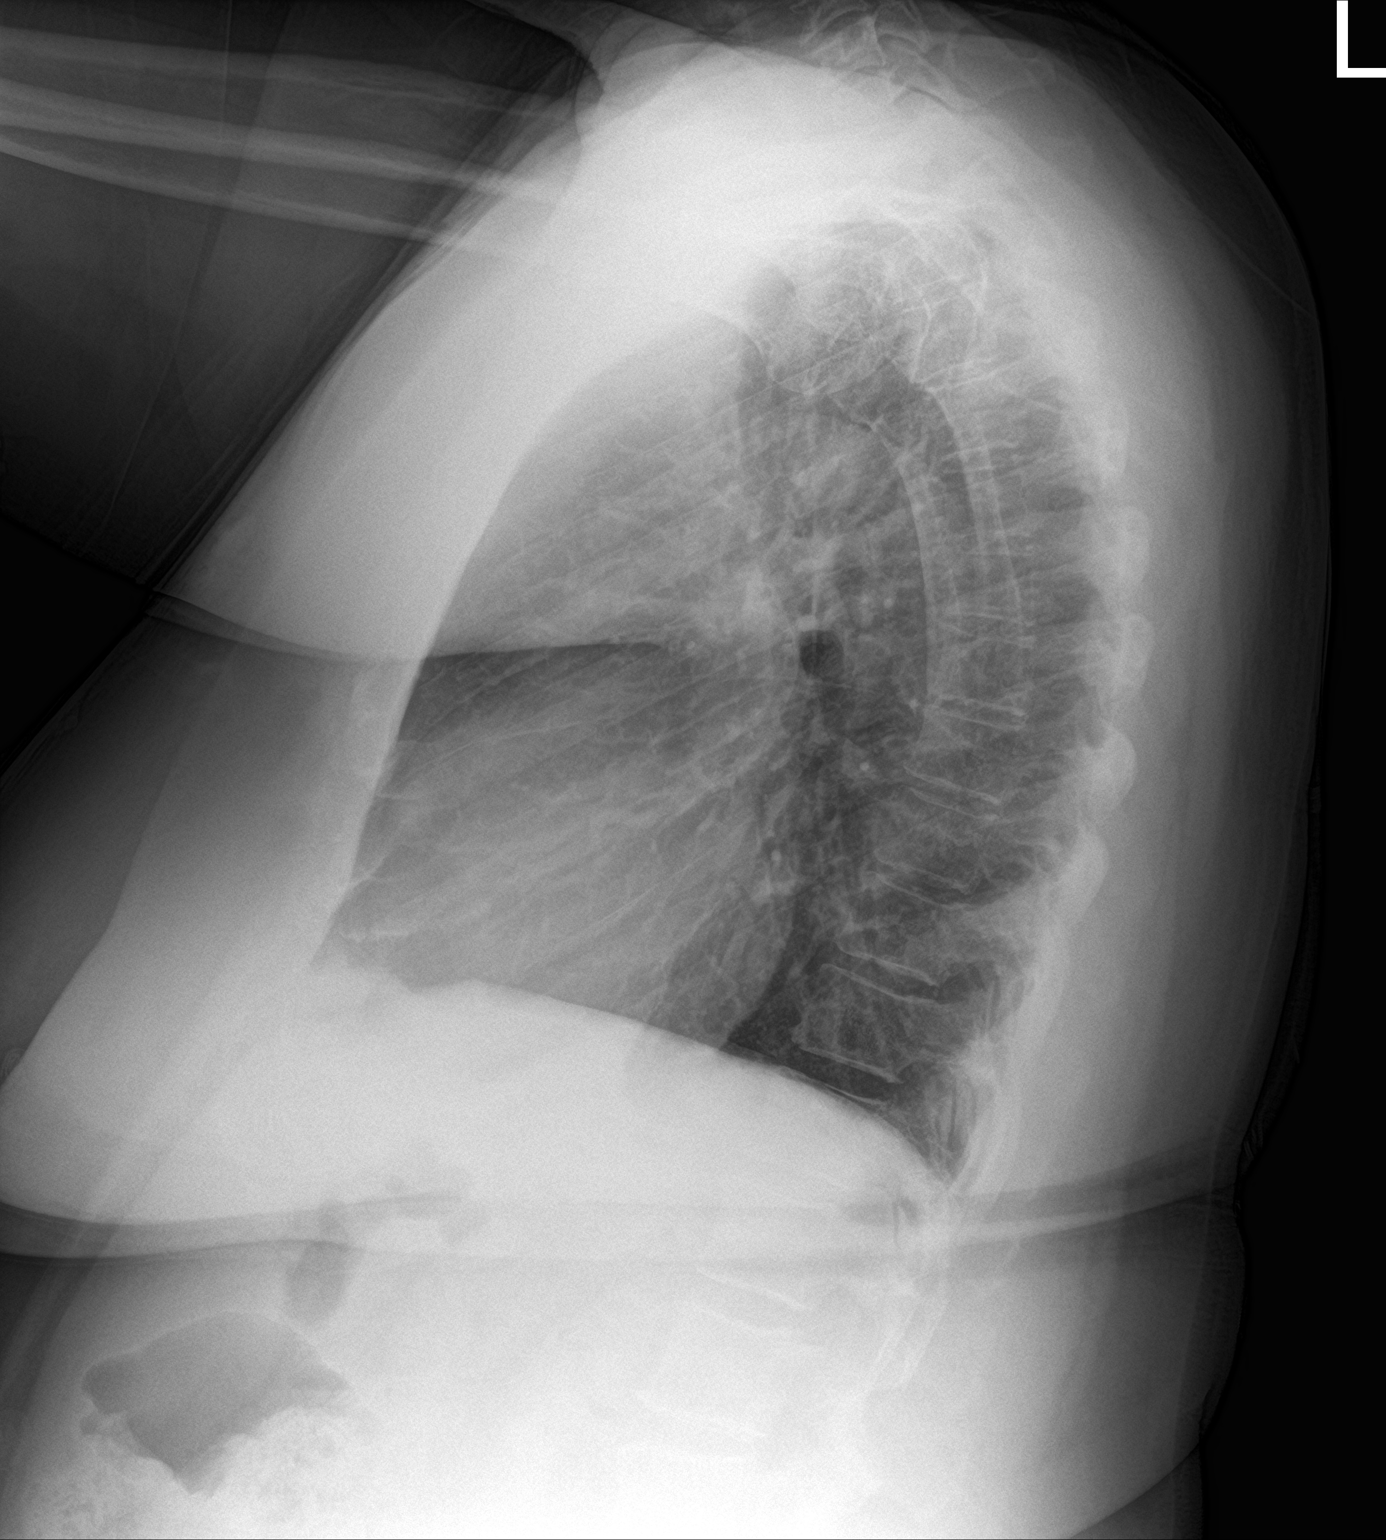

[2 of 2 positions shown; findings below may reference images not displayed]

FINDINGS: The heart size and mediastinal contours are within normal limits.
Both lungs are clear. The visualized skeletal structures are
unremarkable.
IMPRESSION: No active cardiopulmonary disease.

## 2021-08-30 NOTE — ED Provider Triage Note (Signed)
Emergency Medicine Provider Triage Evaluation Note ? ?Bianca Medina , a 71 y.o. female  was evaluated in triage.  Pt complains of left-sided chest discomfort.  She reports on Thursday she went and got a breast exam from her PCP, not a mammogram.  Later that day she started to feel pain in her anterior left chest.  Slowly, the pain started to feel as though it was inside and that it was "cold as if something is dripping inside."  No history of ACS, history of hypertension and diabetes. ? ?Review of Systems  ?Positive: As above ?Negative: Shortness of breath ? ?Physical Exam  ?BP 128/73 (BP Location: Right Arm)   Pulse 75   Temp 99.1 ?F (37.3 ?C) (Oral)   Resp 20   SpO2 96%  ?Gen:   Awake, no distress   ?Resp:  Normal effort  ?MSK:   Moves extremities without difficulty  ?Other:  RRR, lung sounds clear ? ?Medical Decision Making  ?Medically screening exam initiated at 3:18 PM.  Appropriate orders placed.  Bianca Medina was informed that the remainder of the evaluation will be completed by another provider, this initial triage assessment does not replace that evaluation, and the importance of remaining in the ED until their evaluation is complete. ? ? ? ?Chest pain work-up ?  ?Rhae Hammock, PA-C ?08/30/21 1519 ? ?

## 2021-08-30 NOTE — Discharge Instructions (Signed)
Follow-up with your primary care doctor regarding your chest pain.  Come back here if you have worsening chest pain, difficulty breathing or other new concerning symptom. ?

## 2021-08-30 NOTE — ED Triage Notes (Signed)
Pt states she was seen by GYN on Thursday for soreness to L lateral breast.  Reports soreness to L chest since last night and states it feels like something is "wet" and "dripping" inside.  Denies sob, nausea, and vomiting.  Also reports sore on R ankle. ?

## 2021-08-31 ENCOUNTER — Encounter: Payer: Self-pay | Admitting: Certified Registered Nurse Anesthetist

## 2021-08-31 NOTE — ED Provider Notes (Signed)
?[facility] ?Provider Note ? ? ?CSN: [id number] ?Arrival date & time: 08/30/21  1445 ? ?  ? ?History ? ?Chief Complaint  ?Patient presents with  ? Chest Pain  ? ? ?Bianca Medina is a 71 y.o. female.  Presents to ER with concern for chest pain.  Patient reports that on Thursday when she received a breast exam from her regular doctor they pushed on her chest wall and ever since she has been having some chest discomfort.  Comes and goes but is definitely worse when anybody touches her chest wall where she touches it.  She denies any difficulty breathing, otherwise has been feeling fine.  Currently does not have active ongoing chest pain.  denies prior history of heart disease. ? ?HPI ? ?  ? ?Home Medications ?Prior to Admission medications   ?Medication Sig Start Date End Date Taking? Authorizing Provider  ?Biotin 5000 MCG CAPS Take 5,000 mcg by mouth daily.     [provider]  ?Casanthranol-Docusate Sodium (LAXATIVE PLUS STOOL SOFTENER PO) Take by mouth.    [provider]  ?chlorthalidone (HYGROTON) 25 MG tablet Take 25 mg by mouth daily.    [provider]  ?esomeprazole (NEXIUM) 40 MG capsule Take 40 mg by mouth daily.     [provider]  ?fluticasone (FLONASE) 50 MCG/ACT nasal spray Place 2 sprays into both nostrils daily. 05/13/21   Libby Maw, MD  ?losartan (COZAAR) 25 MG tablet Take 25 mg by mouth daily. 07/23/21   [provider]  ?magnesium oxide (MAG-OX) 400 (240 Mg) MG tablet Take 400 mg by mouth daily.    [provider]  ?meloxicam (MOBIC) 15 MG tablet Take 15 mg by mouth daily. 07/03/21   [provider]  ?metFORMIN (GLUCOPHAGE-XR) 500 MG 24 hr tablet TAKE 1 TABLET BY MOUTH EVERY DAY WITH BREAKFAST 07/14/21   Libby Maw, MD  ?potassium chloride SA (KLOR-CON M) 20 MEQ tablet Take 20 mEq by mouth once.    [provider]  ?PROAIR HFA 108 (90 Base) MCG/ACT inhaler Inhale 1-2  puffs into the lungs every 4 (four) hours as needed for wheezing or shortness of breath.  03/16/18   [provider]  ?Vitamin D, Ergocalciferol, (DRISDOL) 1.25 MG (50000 UNIT) CAPS capsule Take 1 capsule (50,000 Units total) by mouth every 7 (seven) days. 06/11/21   Libby Maw, MD  ?   ? ?Allergies    ?Codeine   ? ?Review of Systems   ?Review of Systems  ?Respiratory:  Positive for chest tightness.   ?Cardiovascular:  Positive for chest pain.  ?All other systems reviewed and are negative. ? ?Physical Exam ?Updated Vital Signs ?BP 121/62 (BP Location: Right Arm)   Pulse (!) 58   Temp 98.2 ?F (36.8 ?C) (Oral)   Resp 18   SpO2 97%  ?Physical Exam ?Vitals and nursing note reviewed.  ?Constitutional:   ?   General: She is not in acute distress. ?   Appearance: She is well-developed.  ?HENT:  ?   Head: Normocephalic and atraumatic.  ?Eyes:  ?   Conjunctiva/sclera: Conjunctivae normal.  ?Cardiovascular:  ?   Rate and Rhythm: Normal rate and regular rhythm.  ?   Heart sounds: No murmur heard. ?Pulmonary:  ?   Effort: Pulmonary effort is normal. No respiratory distress.  ?   Breath sounds: Normal breath sounds.  ?Chest:  ?   Comments: Some tenderness over anterior left chest wall ?Abdominal:  ?  Palpations: Abdomen is soft.  ?   Tenderness: There is no abdominal tenderness.  ?Musculoskeletal:     ?   General: No swelling.  ?   Cervical back: Neck supple.  ?Skin: ?   General: Skin is warm and dry.  ?   Capillary Refill: Capillary refill takes less than 2 seconds.  ?Neurological:  ?   Mental Status: She is alert.  ?Psychiatric:     ?   Mood and Affect: Mood normal.  ? ? ?ED Results / Procedures / Treatments   ?Labs ?(all labs ordered are listed, but only abnormal results are displayed) ?Labs Reviewed  ?BASIC METABOLIC PANEL - Abnormal; Notable for the following components:  ?    Result Value  ? Glucose, Bld 125 (*)   ? Calcium 8.7 (*)   ? All other components within normal limits  ?CBC - Abnormal;  Notable for the following components:  ? WBC 12.7 (*)   ? MCV 77.4 (*)   ? MCH 25.7 (*)   ? All other components within normal limits  ?TROPONIN I (HIGH SENSITIVITY)  ?TROPONIN I (HIGH SENSITIVITY)  ? ? ?EKG ?EKG Interpretation ? ?Date/Time:  Saturday August 30 2021 15:01:27 EDT ?Ventricular Rate:  79 ?PR Interval:  160 ?QRS Duration: 80 ?QT Interval:  384 ?QTC Calculation: 440 ?R Axis:   -6 ?Text Interpretation: Sinus rhythm with marked sinus arrhythmia Anterior infarct , age undetermined Abnormal ECG Confirmed by Madalyn Rob [phone]) on 08/30/2021 6:41:24 PM ? ?Radiology ?DG Chest 2 View ? ?Result Date: 08/30/2021 ?CLINICAL DATA:  Chest pain EXAM: CHEST - 2 VIEW COMPARISON:  July 14, 2021 FINDINGS: The heart size and mediastinal contours are within normal limits. Both lungs are clear. The visualized skeletal structures are unremarkable. IMPRESSION: No active cardiopulmonary disease. Electronically Signed   By: Abelardo Diesel M.D.   On: 08/30/2021 16:04   ? ?Procedures ?Procedures  ? ? ?Medications Ordered in ED ?Medications - No data to display ? ?ED Course/ Medical Decision Making/ A&P ?  ?                        ?Medical Decision Making ?Amount and/or Complexity of Data Reviewed ?Labs: ordered. ?Radiology: ordered. ? ? ?71 year old lady presenting to ER with concern for left-sided chest discomfort.  On physical exam she was well-appearing in no distress with normal vital signs.  EKG without acute ischemic change, troponin is within normal limits, based on symptomatology and these findings I have very low suspicion for ACS.  I independently reviewed her CXR.  No acute findings.  Remainder of her basic lab grossly stable.  Slight leukocytosis.  Advised patient to follow-up with her primary care doctor.  On reassessment she continues to deny ongoing pain, will discharge home.  Suspect more likely MSK in nature. ? ? ? ?After the discussed management above, the patient was determined to be safe for discharge.   The patient was in agreement with this plan and all questions regarding their care were answered.  ED return precautions were discussed and the patient will return to the ED with any significant worsening of condition. ? ? ? ? ? ? ? ? ?Final Clinical Impression(s) / ED Diagnoses ?Final diagnoses:  ?Chest pain, unspecified type  ? ? ?Rx / DC Orders ?ED Discharge Orders   ? ? None  ? ?  ? ? ?  ?Lucrezia Starch, MD ?08/31/21 1707 ? ?

## 2021-09-01 NOTE — Telephone Encounter (Signed)
Pt scheduled at [facility] on 09/25/21.  ?

## 2021-09-02 NOTE — Telephone Encounter (Signed)
My Chart message sent informing patient of date/time of breast imaging. ?

## 2021-09-03 ENCOUNTER — Encounter: Payer: Self-pay | Admitting: Family Medicine

## 2021-09-03 ENCOUNTER — Ambulatory Visit (INDEPENDENT_AMBULATORY_CARE_PROVIDER_SITE_OTHER): Payer: Medicare PPO | Admitting: Family Medicine

## 2021-09-03 ENCOUNTER — Ambulatory Visit: Payer: Medicare PPO | Admitting: Family Medicine

## 2021-09-03 VITALS — BP 124/68 | HR 76 | Temp 97.8°F | Ht 65.0 in | Wt 267.2 lb

## 2021-09-03 DIAGNOSIS — R7303 Prediabetes: Secondary | ICD-10-CM | POA: Diagnosis not present

## 2021-09-03 DIAGNOSIS — E7800 Pure hypercholesterolemia, unspecified: Secondary | ICD-10-CM | POA: Diagnosis not present

## 2021-09-03 DIAGNOSIS — E559 Vitamin D deficiency, unspecified: Secondary | ICD-10-CM

## 2021-09-03 DIAGNOSIS — I10 Essential (primary) hypertension: Secondary | ICD-10-CM

## 2021-09-03 LAB — COMPREHENSIVE METABOLIC PANEL
ALT: 16 U/L (ref 0–35)
AST: 15 U/L (ref 0–37)
Albumin: 3.6 g/dL (ref 3.5–5.2)
Alkaline Phosphatase: 101 U/L (ref 39–117)
BUN: 13 mg/dL (ref 6–23)
CO2: 29 mEq/L (ref 19–32)
Calcium: 9 mg/dL (ref 8.4–10.5)
Chloride: 103 mEq/L (ref 96–112)
Creatinine, Ser: 0.75 mg/dL (ref 0.40–1.20)
GFR: 81.26 mL/min (ref >60.00)
Glucose, Bld: 158 mg/dL — ABNORMAL HIGH (ref 70–99)
Potassium: 3.7 mEq/L (ref 3.5–5.1)
Sodium: 139 mEq/L (ref 135–145)
Total Bilirubin: 0.5 mg/dL (ref 0.2–1.2)
Total Protein: 7 g/dL (ref 6.0–8.3)

## 2021-09-03 LAB — LDL CHOLESTEROL, DIRECT: Direct LDL: 136 mg/dL

## 2021-09-03 LAB — VITAMIN D 25 HYDROXY (VIT D DEFICIENCY, FRACTURES): VITD: 34.55 ng/mL (ref 30.00–100.00)

## 2021-09-03 MED ORDER — ATORVASTATIN CALCIUM 10 MG PO TABS: 10.0000 mg | ORAL_TABLET | Freq: Every day | ORAL | 3 refills | Status: DC

## 2021-09-03 NOTE — Progress Notes (Signed)
? ?Established Patient Office Visit ? ?Subjective   ?Patient ID: Bianca Medina, female    DOB: 1951/02/29  Age: 71 y.o. MRN: [id number] ? ?Chief Complaint  ?Patient presents with  ? Hospitalization Follow-up  ?  Hospital f/u for chest pains on 08/30/21. Reports has gotten better.   ? ? ?HPI presents for hospital discharge follow-up status post evaluation for chest pain.  Diagnosed with chest wall strain.  Work-up for cardiac origin was essentially negative.  Patient does have an elevated 10-year risk score secondary to history of hypertension and prediabetes.  Her lipid profile is actually favorable.  She does not smoke.  Blood pressure well controlled with chlorthalidone and losartan.  Diabetes has been under control metformin ? ? ? ?Review of Systems  ?Constitutional:  Negative for chills, diaphoresis, malaise/fatigue and weight loss.  ?HENT: Negative.    ?Eyes: Negative.  Negative for blurred vision and double vision.  ?Cardiovascular:  Negative for chest pain.  ?Gastrointestinal:  Negative for abdominal pain.  ?Genitourinary: Negative.   ?Musculoskeletal:  Negative for falls and myalgias.  ?Neurological:  Negative for speech change, loss of consciousness and weakness.  ?Psychiatric/Behavioral: Negative.    ? ?  ?Objective:  ?  ? ?BP 124/68   Pulse 76   Temp 97.8 ?F (36.6 ?C) (Temporal)   Ht '5\' 5"'$  (1.651 m)   Wt 267 lb 3.2 oz (121.2 kg)   SpO2 94%   BMI 44.46 kg/m?  ? ? ?Physical Exam ?Constitutional:   ?   General: She is not in acute distress. ?   Appearance: Normal appearance. She is not ill-appearing, toxic-appearing or diaphoretic.  ?HENT:  ?   Head: Normocephalic and atraumatic.  ?   Right Ear: External ear normal.  ?   Left Ear: External ear normal.  ?   Mouth/Throat:  ?   Mouth: Mucous membranes are moist.  ?   Pharynx: Oropharynx is clear. No oropharyngeal exudate or posterior oropharyngeal erythema.  ?Eyes:  ?   General: No scleral icterus.    ?   Right eye: No discharge.     ?   Left eye: No  discharge.  ?   Extraocular Movements: Extraocular movements intact.  ?   Conjunctiva/sclera: Conjunctivae normal.  ?   Pupils: Pupils are equal, round, and reactive to light.  ?Cardiovascular:  ?   Rate and Rhythm: Normal rate and regular rhythm.  ?Pulmonary:  ?   Effort: Pulmonary effort is normal. No respiratory distress.  ?   Breath sounds: Normal breath sounds.  ?Abdominal:  ?   General: Bowel sounds are normal.  ?   Tenderness: There is no abdominal tenderness. There is no guarding.  ?Musculoskeletal:  ?   Cervical back: No rigidity or tenderness.  ?Skin: ?   General: Skin is warm and dry.  ?Neurological:  ?   Mental Status: She is alert and oriented to person, place, and time.  ?Psychiatric:     ?   Mood and Affect: Mood normal.     ?   Behavior: Behavior normal.  ? ? ? ?Results for orders placed or performed in visit on 09/03/21  ?Comprehensive metabolic panel  ?Result Value Ref Range  ? Sodium 139 135 - 145 mEq/L  ? Potassium 3.7 3.5 - 5.1 mEq/L  ? Chloride 103 96 - 112 mEq/L  ? CO2 29 19 - 32 mEq/L  ? Glucose, Bld 158 (H) 70 - 99 mg/dL  ? BUN 13 6 - 23 mg/dL  ?  Creatinine, Ser 0.75 0.40 - 1.20 mg/dL  ? Total Bilirubin 0.5 0.2 - 1.2 mg/dL  ? Alkaline Phosphatase 101 39 - 117 U/L  ? AST 15 0 - 37 U/L  ? ALT 16 0 - 35 U/L  ? Total Protein 7.0 6.0 - 8.3 g/dL  ? Albumin 3.6 3.5 - 5.2 g/dL  ? GFR 81.26 >60.00 mL/min  ? Calcium 9.0 8.4 - 10.5 mg/dL  ?LDL cholesterol, direct  ?Result Value Ref Range  ? Direct LDL 136.0 mg/dL  ?VITAMIN D 25 Hydroxy (Vit-D Deficiency, Fractures)  ?Result Value Ref Range  ? VITD 34.55 30.00 - 100.00 ng/mL  ? ? ? ? ?The 10-year ASCVD risk score (Arnett DK, et al., 2019) is: 51.8% ? ?  ?Assessment & Plan:  ? ?Problem List Items Addressed This Visit   ? ?  ? Cardiovascular and Mediastinum  ? Essential hypertension - Primary  ? Relevant Medications  ? atorvastatin (LIPITOR) 20 MG tablet  ? Other Relevant Orders  ? Comprehensive metabolic panel (Completed)  ? LDL cholesterol, direct  (Completed)  ?  ? Other  ? Pre-diabetes  ? Relevant Orders  ? Comprehensive metabolic panel (Completed)  ? Hemoglobin A1c  ? Vitamin D deficiency  ? Relevant Orders  ? VITAMIN D 25 Hydroxy (Vit-D Deficiency, Fractures) (Completed)  ? ?Other Visit Diagnoses   ? ? Elevated cholesterol      ? Relevant Medications  ? atorvastatin (LIPITOR) 20 MG tablet  ? ?  ? ? ?Return in about 3 months (around 12/03/2021).  ? ?Discussed her elevated risk for vascular disease mainly due to age, prediabetes and hypertension.  Continue losartan and chlorthalidone and metformin.  We will start on low-dose atorvastatin.  She will let me know about any unusual aches or pains while taking the atorvastatin, otherwise we will see her back ?Libby Maw, MD ? ?

## 2021-09-05 ENCOUNTER — Emergency Department (HOSPITAL_COMMUNITY): Payer: Medicare PPO

## 2021-09-05 ENCOUNTER — Emergency Department (HOSPITAL_COMMUNITY)
Admission: EM | Admit: 2021-09-05 | Discharge: 2021-09-05 | Disposition: A | Discharge status: Home / Self Care | Payer: Medicare PPO | Attending: Emergency Medicine | Admitting: Emergency Medicine

## 2021-09-05 ENCOUNTER — Encounter: Payer: Medicare PPO | Admitting: Gastroenterology

## 2021-09-05 DIAGNOSIS — Z90710 Acquired absence of both cervix and uterus: Secondary | ICD-10-CM | POA: Diagnosis not present

## 2021-09-05 DIAGNOSIS — E119 Type 2 diabetes mellitus without complications: Secondary | ICD-10-CM | POA: Diagnosis not present

## 2021-09-05 DIAGNOSIS — Z7984 Long term (current) use of oral hypoglycemic drugs: Secondary | ICD-10-CM | POA: Diagnosis not present

## 2021-09-05 DIAGNOSIS — K5790 Diverticulosis of intestine, part unspecified, without perforation or abscess without bleeding: Secondary | ICD-10-CM | POA: Diagnosis not present

## 2021-09-05 DIAGNOSIS — R1032 Left lower quadrant pain: Secondary | ICD-10-CM | POA: Insufficient documentation

## 2021-09-05 DIAGNOSIS — K429 Umbilical hernia without obstruction or gangrene: Secondary | ICD-10-CM | POA: Diagnosis not present

## 2021-09-05 DIAGNOSIS — M549 Dorsalgia, unspecified: Secondary | ICD-10-CM | POA: Diagnosis not present

## 2021-09-05 LAB — COMPREHENSIVE METABOLIC PANEL
ALT: 17 U/L (ref 0–44)
AST: 15 U/L (ref 15–41)
Albumin: 3.2 g/dL — ABNORMAL LOW (ref 3.5–5.0)
Alkaline Phosphatase: 96 U/L (ref 38–126)
Anion gap: 5 (ref 5–15)
BUN: 16 mg/dL (ref 8–23)
CO2: 29 mmol/L (ref 22–32)
Calcium: 9 mg/dL (ref 8.9–10.3)
Chloride: 106 mmol/L (ref 98–111)
Creatinine, Ser: 0.76 mg/dL (ref 0.44–1.00)
GFR, Estimated: >60 mL/min (ref >60)
Glucose, Bld: 121 mg/dL — ABNORMAL HIGH (ref 70–99)
Potassium: 3.9 mmol/L (ref 3.5–5.1)
Sodium: 140 mmol/L (ref 135–145)
Total Bilirubin: 0.4 mg/dL (ref 0.3–1.2)
Total Protein: 6.7 g/dL (ref 6.5–8.1)

## 2021-09-05 LAB — CBC WITH DIFFERENTIAL/PLATELET
Abs Immature Granulocytes: 0.05 10*3/uL (ref 0.00–0.07)
Basophils Absolute: 0.1 10*3/uL (ref 0.0–0.1)
Basophils Relative: 1 %
Eosinophils Absolute: 0.2 10*3/uL (ref 0.0–0.5)
Eosinophils Relative: 1 %
HCT: 39.9 % (ref 36.0–46.0)
Hemoglobin: 13.3 g/dL (ref 12.0–15.0)
Immature Granulocytes: 0 %
Lymphocytes Relative: 17 %
Lymphs Abs: 2.1 10*3/uL (ref 0.7–4.0)
MCH: 25.9 pg — ABNORMAL LOW (ref 26.0–34.0)
MCHC: 33.3 g/dL (ref 30.0–36.0)
MCV: 77.6 fL — ABNORMAL LOW (ref 80.0–100.0)
Monocytes Absolute: 0.9 10*3/uL (ref 0.1–1.0)
Monocytes Relative: 8 %
Neutro Abs: 9.1 10*3/uL — ABNORMAL HIGH (ref 1.7–7.7)
Neutrophils Relative %: 73 %
Platelets: 298 10*3/uL (ref 150–400)
RBC: 5.14 MIL/uL — ABNORMAL HIGH (ref 3.87–5.11)
RDW: 14 % (ref 11.5–15.5)
WBC: 12.4 10*3/uL — ABNORMAL HIGH (ref 4.0–10.5)
nRBC: 0 % (ref 0.0–0.2)

## 2021-09-05 LAB — URINALYSIS, ROUTINE W REFLEX MICROSCOPIC
Bilirubin Urine: NEGATIVE
Glucose, UA: NEGATIVE mg/dL
Hgb urine dipstick: NEGATIVE
Ketones, ur: NEGATIVE mg/dL
Leukocytes,Ua: NEGATIVE
Nitrite: NEGATIVE
Protein, ur: NEGATIVE mg/dL
Specific Gravity, Urine: 1.019 (ref 1.005–1.030)
pH: 5 (ref 5.0–8.0)

## 2021-09-05 LAB — LIPASE, BLOOD: Lipase: 22 U/L (ref 11–51)

## 2021-09-05 IMAGING — CT CT RENAL STONE PROTOCOL
2 of 4 series · 16 of 46 positions shown, 18 images · non-contrast
Comparison: CT abdomen [DATE]

CLINICAL DATA: Flank pain, kidney stone suspected left flank pain,
stone v diverticulitis



[Series 3: renal stone 5.0 · axial · 0.90mm/px · z∈[+786,+1191]mm · 13 of 92 slices shown, 15 images]
[im 7/92  soft-tissue]
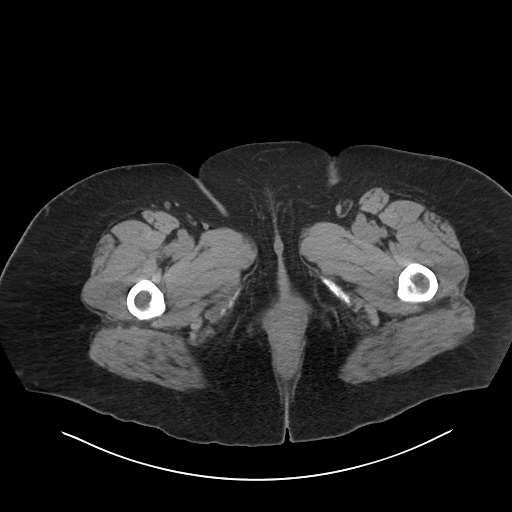
[im 7/92  bone]
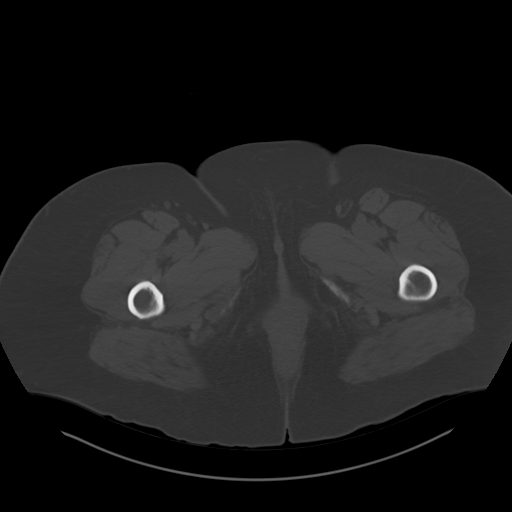
[im 14/92  soft-tissue]
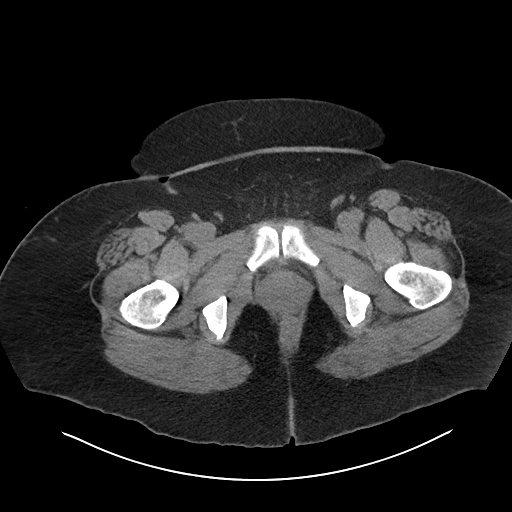
[im 21/92  soft-tissue]
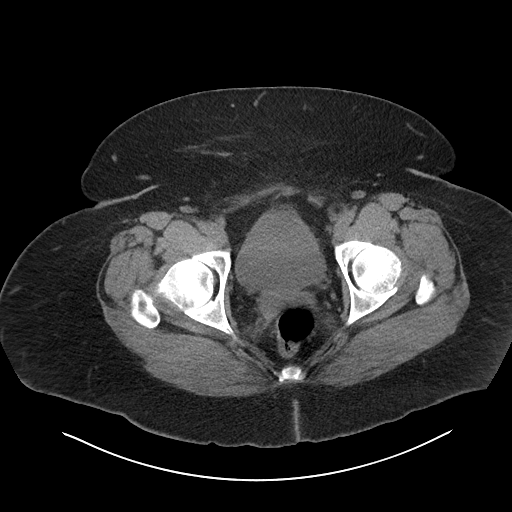
[im 27/92  soft-tissue]
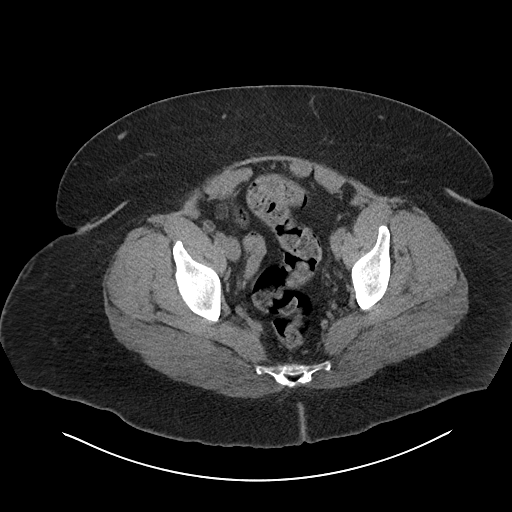
[im 34/92  soft-tissue]
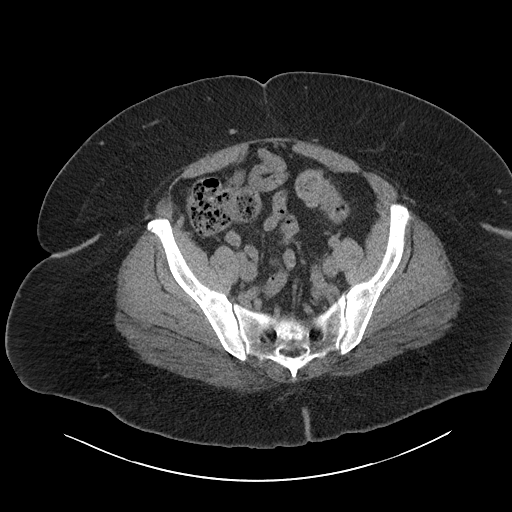
[im 41/92  soft-tissue]
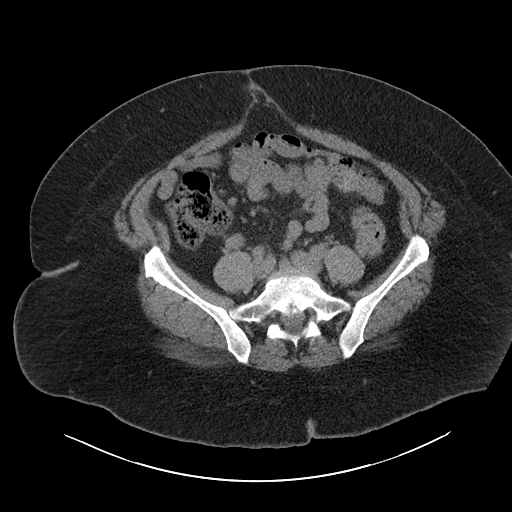
[im 48/92  soft-tissue]
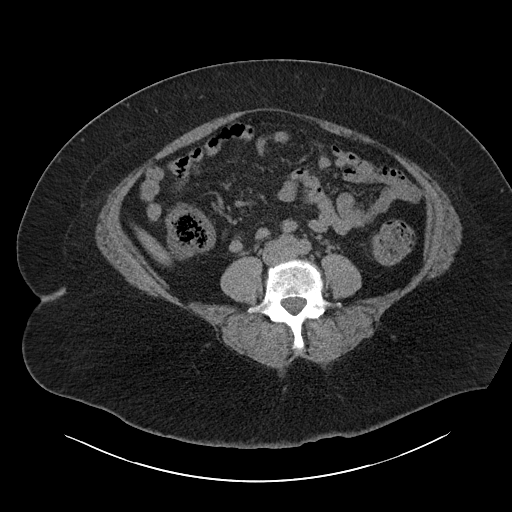
[im 54/92  soft-tissue]
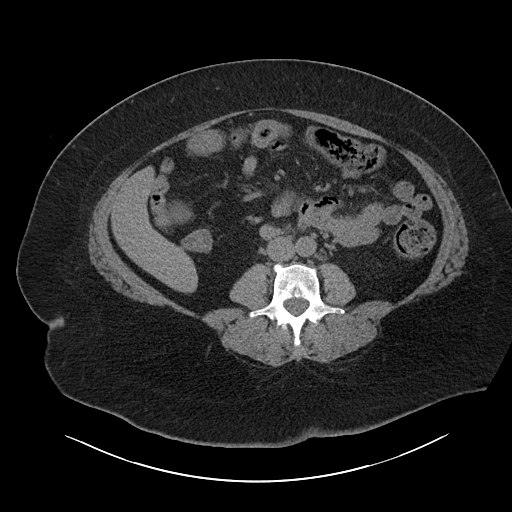
[im 61/92  soft-tissue]
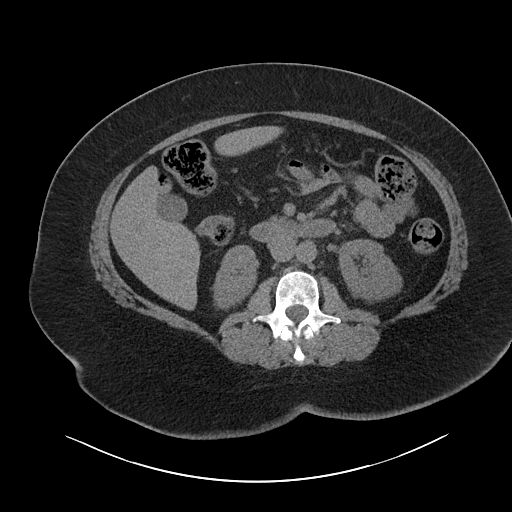
[im 61/92  bone]
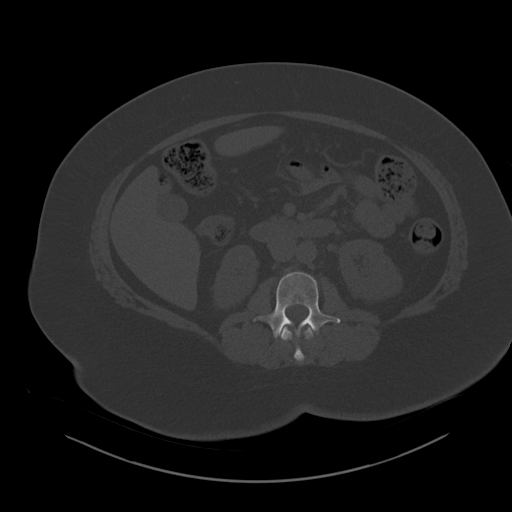
[im 68/92  soft-tissue]
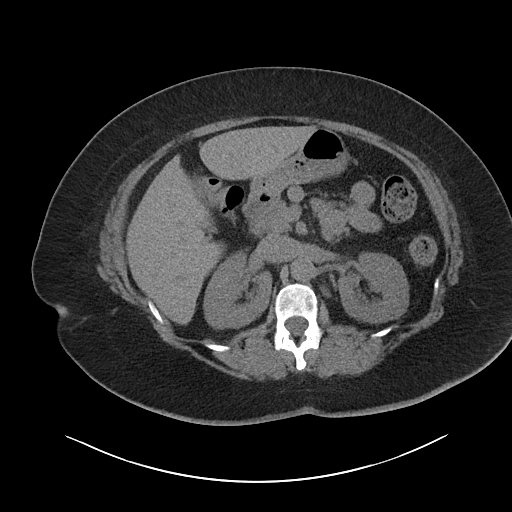
[im 75/92  soft-tissue]
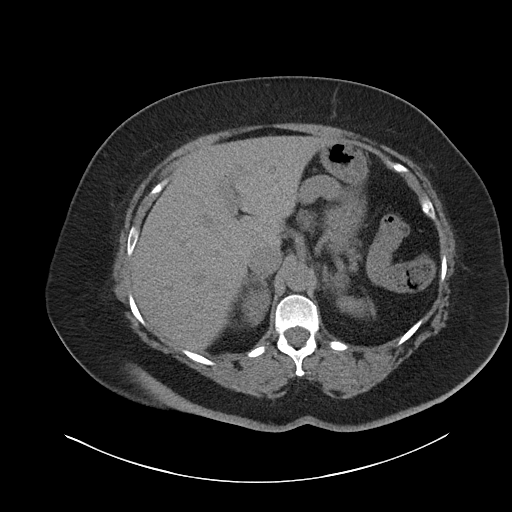
[im 81/92  soft-tissue]
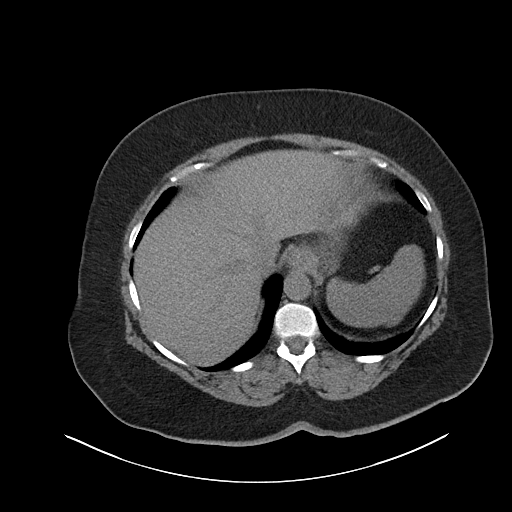
[im 88/92  soft-tissue]
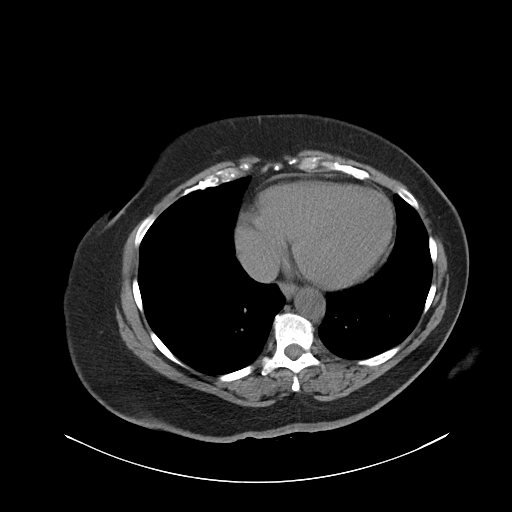

[Series 6: cor · coronal · 0.85mm/px · 3 of 151 slices shown]
[im 51/151  soft-tissue]
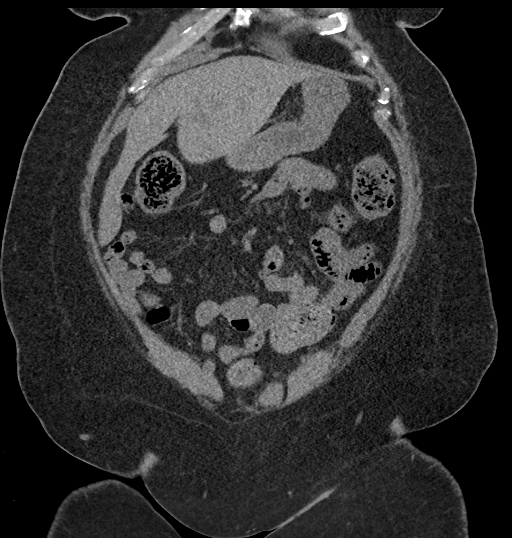
[im 67/151  soft-tissue]
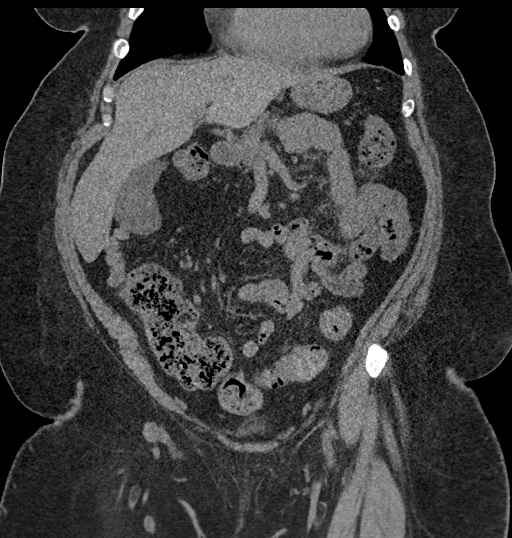
[im 84/151  soft-tissue]
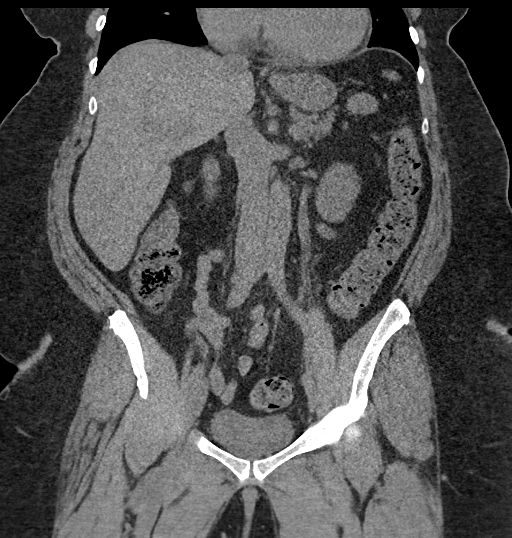

[16 of 46 positions shown; findings below may reference images not displayed]

FINDINGS: Lower chest: No acute abnormality.

Hepatobiliary: No focal liver abnormality is seen. The gallbladder
is unremarkable.

Pancreas: Unremarkable. No pancreatic ductal dilatation or
surrounding inflammatory changes.

Spleen: Normal in size without focal abnormality.

Adrenals/Urinary Tract: There is a 1.7 cm left adrenal nodule which
is unchanged from prior CT in [DATE], cystically likely to be an
adenoma. No hydronephrosis or nephrolithiasis. Bladder is
unremarkable.

Stomach/Bowel: The stomach is within normal limits. There is no
evidence of bowel obstruction.Appendix is normal. Moderate colonic
stool burden. Scattered diverticula. No diverticulitis.

Vascular/Lymphatic: No significant vascular findings are present. No
enlarged abdominal or pelvic lymph nodes.

Reproductive: Prior hysterectomy.

Other: Diastasis recti. Small fat containing umbilical hernia. No
ascites. No free air.

Musculoskeletal: No acute osseous abnormality. No suspicious lytic
or blastic lesions.
IMPRESSION: No acute abdominopelvic abnormality. No hydronephrosis or
nephroureterolithiasis.

Scattered colonic diverticula without evidence of acute
diverticulitis. Normal appendix.

## 2021-09-05 MED ORDER — SODIUM CHLORIDE 0.9 % IV BOLUS
1000.0000 mL | Freq: Once | INTRAVENOUS | Status: AC
  Administered 2021-09-05: 1000 mL via INTRAVENOUS

## 2021-09-05 NOTE — ED Provider Notes (Signed)
?[facility] ?Provider Note ? ? ?CSN: [id number] ?Arrival date & time: 09/05/21  0806 ? ?  ? ?History ? ?Chief Complaint  ?Patient presents with  ? Abdominal Pain  ? Back Pain  ? ? ?Bianca Medina is a 71 y.o. female. ? ?The history is provided by the patient.  ?Abdominal Pain ?Pain location:  LLQ ?Pain quality: aching   ?Pain radiates to:  Does not radiate ?Pain severity:  Mild ?Onset quality:  Gradual ?Duration:  3 days ?Timing:  Constant ?Progression:  Unchanged ?Chronicity:  New ?Context: previous surgery   ?Context: not sick contacts and not suspicious food intake   ?Relieved by:  Nothing ?Worsened by:  Nothing ?Associated symptoms: constipation   ?Associated symptoms: no anorexia, no chest pain, no cough, no diarrhea, no dysuria, no fatigue, no fever, no nausea, no shortness of breath and no sore throat   ?Risk factors: multiple surgeries   ?Back Pain ?Associated symptoms: abdominal pain   ?Associated symptoms: no chest pain, no dysuria and no fever   ? ?  ? ?Home Medications ?Prior to Admission medications   ?Medication Sig Start Date End Date Taking? Authorizing Provider  ?atorvastatin (LIPITOR) 10 MG tablet Take 1 tablet (10 mg total) by mouth daily. 09/03/21   Libby Maw, MD  ?Biotin 5000 MCG CAPS Take 5,000 mcg by mouth daily.     [provider]  ?Casanthranol-Docusate Sodium (LAXATIVE PLUS STOOL SOFTENER PO) Take by mouth.    [provider]  ?chlorthalidone (HYGROTON) 25 MG tablet Take 25 mg by mouth daily.    [provider]  ?esomeprazole (NEXIUM) 40 MG capsule Take 40 mg by mouth daily.     [provider]  ?fluticasone (FLONASE) 50 MCG/ACT nasal spray Place 2 sprays into both nostrils daily. 05/13/21   Libby Maw, MD  ?losartan (COZAAR) 25 MG tablet Take 25 mg by mouth daily. 07/23/21   [provider]  ?magnesium oxide (MAG-OX) 400 (240 Mg) MG tablet Take 400 mg by mouth daily.    [provider]  ?meloxicam (MOBIC) 15 MG tablet Take 15 mg by mouth daily. 07/03/21   [provider]  ?metFORMIN (GLUCOPHAGE-XR) 500 MG 24 hr tablet TAKE 1 TABLET BY MOUTH EVERY DAY WITH BREAKFAST 07/14/21   Libby Maw, MD  ?potassium chloride SA (KLOR-CON M) 20 MEQ tablet Take 20 mEq by mouth once.    [provider]  ?PROAIR HFA 108 (90 Base) MCG/ACT inhaler Inhale 1-2 puffs into the lungs every 4 (four) hours as needed for wheezing or shortness of breath.  03/16/18   [provider]  ?Vitamin D, Ergocalciferol, (DRISDOL) 1.25 MG (50000 UNIT) CAPS capsule Take 1 capsule (50,000 Units total) by mouth every 7 (seven) days. 06/11/21   Libby Maw, MD  ?   ? ?Allergies    ?Codeine   ? ?Review of Systems   ?Review of Systems  ?Constitutional:  Negative for fatigue and fever.  ?HENT:  Negative for sore throat.   ?Respiratory:  Negative for cough and shortness of breath.   ?Cardiovascular:  Negative for chest pain.  ?Gastrointestinal:  Positive for abdominal pain and constipation. Negative for anorexia, diarrhea and nausea.  ?Genitourinary:  Negative for dysuria.  ?Musculoskeletal:  Positive for back pain.  ? ?Physical Exam ?Updated Vital Signs ?BP (!) 145/71 (BP Location: Right Arm)   Pulse 63   Temp 99 ?F (37.2 ?C) (Oral)   Resp 14   Ht  $'5\' 5"'Z$  (1.651 m)   Wt 121.1 kg   SpO2 98%   BMI 44.43 kg/m?  ?Physical Exam ?Vitals and nursing note reviewed.  ?Constitutional:   ?   General: She is not in acute distress. ?   Appearance: She is well-developed. She is not ill-appearing.  ?HENT:  ?   Head: Normocephalic and atraumatic.  ?Eyes:  ?   Extraocular Movements: Extraocular movements intact.  ?   Conjunctiva/sclera: Conjunctivae normal.  ?   Pupils: Pupils are equal, round, and reactive to light.  ?Cardiovascular:  ?   Rate and Rhythm: Normal rate and regular rhythm.  ?   Heart sounds: Normal heart sounds. No murmur heard. ?Pulmonary:  ?   Effort: Pulmonary effort is  normal. No respiratory distress.  ?   Breath sounds: Normal breath sounds.  ?Abdominal:  ?   Palpations: Abdomen is soft.  ?   Tenderness: There is abdominal tenderness in the left lower quadrant.  ?   Hernia: No hernia is present.  ?Musculoskeletal:     ?   General: No swelling.  ?   Cervical back: Neck supple.  ?Skin: ?   General: Skin is warm and dry.  ?   Capillary Refill: Capillary refill takes less than 2 seconds.  ?Neurological:  ?   Mental Status: She is alert.  ?Psychiatric:     ?   Mood and Affect: Mood normal.  ? ? ?ED Results / Procedures / Treatments   ?Labs ?(all labs ordered are listed, but only abnormal results are displayed) ?Labs Reviewed  ?CBC WITH DIFFERENTIAL/PLATELET - Abnormal; Notable for the following components:  ?    Result Value  ? WBC 12.4 (*)   ? RBC 5.14 (*)   ? MCV 77.6 (*)   ? MCH 25.9 (*)   ? Neutro Abs 9.1 (*)   ? All other components within normal limits  ?COMPREHENSIVE METABOLIC PANEL - Abnormal; Notable for the following components:  ? Glucose, Bld 121 (*)   ? Albumin 3.2 (*)   ? All other components within normal limits  ?URINALYSIS, ROUTINE W REFLEX MICROSCOPIC - Abnormal; Notable for the following components:  ? APPearance HAZY (*)   ? All other components within normal limits  ?LIPASE, BLOOD  ? ? ?EKG ?None ? ?Radiology ?CT Renal Stone Study ? ?Result Date: 09/05/2021 ?CLINICAL DATA:  Flank pain, kidney stone suspected left flank pain, stone v diverticulitis EXAM: CT ABDOMEN AND PELVIS WITHOUT CONTRAST TECHNIQUE: Multidetector CT imaging of the abdomen and pelvis was performed following the standard protocol without IV contrast. RADIATION DOSE REDUCTION: This exam was performed according to the departmental dose-optimization program which includes automated exposure control, adjustment of the mA and/or kV according to patient size and/or use of iterative reconstruction technique. COMPARISON:  CT abdomen 07/25/2018 FINDINGS: Lower chest: No acute abnormality. Hepatobiliary: No  focal liver abnormality is seen. The gallbladder is unremarkable. Pancreas: Unremarkable. No pancreatic ductal dilatation or surrounding inflammatory changes. Spleen: Normal in size without focal abnormality. Adrenals/Urinary Tract: There is a 1.7 cm left adrenal nodule which is unchanged from prior CT in March 2020, cystically likely to be an adenoma. No hydronephrosis or nephrolithiasis. Bladder is unremarkable. Stomach/Bowel: The stomach is within normal limits. There is no evidence of bowel obstruction.Appendix is normal. Moderate colonic stool burden. Scattered diverticula. No diverticulitis. Vascular/Lymphatic: No significant vascular findings are present. No enlarged abdominal or pelvic lymph nodes. Reproductive: Prior hysterectomy. Other: Diastasis recti. Small fat containing umbilical hernia. No ascites. No free  air. Musculoskeletal: No acute osseous abnormality. No suspicious lytic or blastic lesions. IMPRESSION: No acute abdominopelvic abnormality. No hydronephrosis or nephroureterolithiasis. Scattered colonic diverticula without evidence of acute diverticulitis. Normal appendix. Electronically Signed   By: Maurine Simmering M.D.   On: 09/05/2021 09:52   ? ?Procedures ?Procedures  ? ? ?Medications Ordered in ED ?Medications  ?sodium chloride 0.9 % bolus 1,000 mL (0 mLs Intravenous Stopped 09/05/21 1000)  ? ? ?ED Course/ Medical Decision Making/ A&P ?  ?                        ?Medical Decision Making ?Amount and/or Complexity of Data Reviewed ?Labs: ordered. ?Radiology: ordered. ? ? ?Bianca Medina is here with left lower quadrant abdominal pain for the last several days.  Admits to constipation.  No history of kidney stones and diverticulitis.  Has been using some stool softeners without much relief.  History of high cholesterol, diabetes.  She has had some abdominal surgeries in the past.  Differential diagnosis is diverticulitis versus bowel obstruction versus kidney stone versus UTI versus constipation.  We  will get CBC, CMP, lipase, urinalysis, CT scan abdomen and pelvis.  She is okay without any pain medicine or nausea medicine at this time.  Will give IV fluids. ? ?Patient with overall unremarkable work-up.  Per my re

## 2021-09-05 NOTE — ED Notes (Signed)
EDP at [facility] 

## 2021-09-05 NOTE — ED Notes (Signed)
Back from CT, no changes, resting comfortably, pending urine sample, IVF infusing.  ?

## 2021-09-05 NOTE — ED Notes (Signed)
To CT via stretcher

## 2021-09-05 NOTE — ED Triage Notes (Signed)
Pt. Stated, Im having RLQ pain started on Wednesday and feels like something crawling up my back. My rt. Eye feels a pain. And maybe my vision might be blurry ?

## 2021-09-08 ENCOUNTER — Encounter: Payer: Medicare PPO | Admitting: Gastroenterology

## 2021-09-09 ENCOUNTER — Telehealth: Payer: Self-pay

## 2021-09-09 NOTE — Telephone Encounter (Signed)
Called pt to schedule sleep study but pt states she will call back at a better time  ?

## 2021-09-10 DIAGNOSIS — R8279 Other abnormal findings on microbiological examination of urine: Secondary | ICD-10-CM | POA: Diagnosis not present

## 2021-09-10 DIAGNOSIS — R1030 Lower abdominal pain, unspecified: Secondary | ICD-10-CM | POA: Diagnosis not present

## 2021-09-10 DIAGNOSIS — R102 Pelvic and perineal pain: Secondary | ICD-10-CM | POA: Diagnosis not present

## 2021-09-10 NOTE — Telephone Encounter (Signed)
Left message to call me regarding Breast imaging appointment I have scheduled for her and need to provide info. ?

## 2021-09-15 DIAGNOSIS — R1032 Left lower quadrant pain: Secondary | ICD-10-CM | POA: Diagnosis not present

## 2021-09-15 MED ORDER — ATORVASTATIN CALCIUM 20 MG PO TABS: 20.0000 mg | ORAL_TABLET | Freq: Every day | ORAL | 3 refills | Status: DC

## 2021-09-17 ENCOUNTER — Ambulatory Visit: Payer: Medicare PPO | Admitting: Obstetrics & Gynecology

## 2021-09-24 NOTE — Telephone Encounter (Signed)
Left message in patient's voice mail that I have her scheduled for diagnostic mammogram tomorrow and she has not returned my call or read the My Chart message I sent. I asked her to call me back by 4pm today or I will have to cancel her appointment for tomorrow so that it will open the slot for someone who calls in the morning checking for cancellations. ?

## 2021-09-24 NOTE — Telephone Encounter (Signed)
Patient called and confirmed her appt at [facility] for tomorrow. ?

## 2021-09-25 ENCOUNTER — Other Ambulatory Visit: Payer: Self-pay | Admitting: Obstetrics & Gynecology

## 2021-09-25 ENCOUNTER — Ambulatory Visit
Admission: RE | Admit: 2021-09-25 | Discharge: 2021-09-25 | Disposition: A | Discharge status: Home / Self Care | Payer: Medicare PPO | Source: Ambulatory Visit | Attending: Obstetrics & Gynecology | Admitting: Obstetrics & Gynecology

## 2021-09-25 DIAGNOSIS — N6310 Unspecified lump in the right breast, unspecified quadrant: Secondary | ICD-10-CM

## 2021-09-25 DIAGNOSIS — N644 Mastodynia: Secondary | ICD-10-CM

## 2021-09-25 IMAGING — MG DIGITAL DIAGNOSTIC BILAT W/ TOMO W/ CAD
8 series · 8 of 24 positions shown · non-contrast
Comparison: Previous exam(s).

CLINICAL DATA: Intermittent pain and tenderness in the outer left
breast and intermittent pain in the upper left breast, improved.

EXAM:
DIGITAL DIAGNOSTIC BILATERAL MAMMOGRAM WITH TOMOSYNTHESIS AND CAD;
ULTRASOUND RIGHT BREAST LIMITED
TECHNIQUE: Bilateral digital diagnostic mammography and breast tomosynthesis
was performed. The images were evaluated with computer-aided
detection.; Targeted ultrasound examination of the right breast was
performed

[R CC synth-2D]
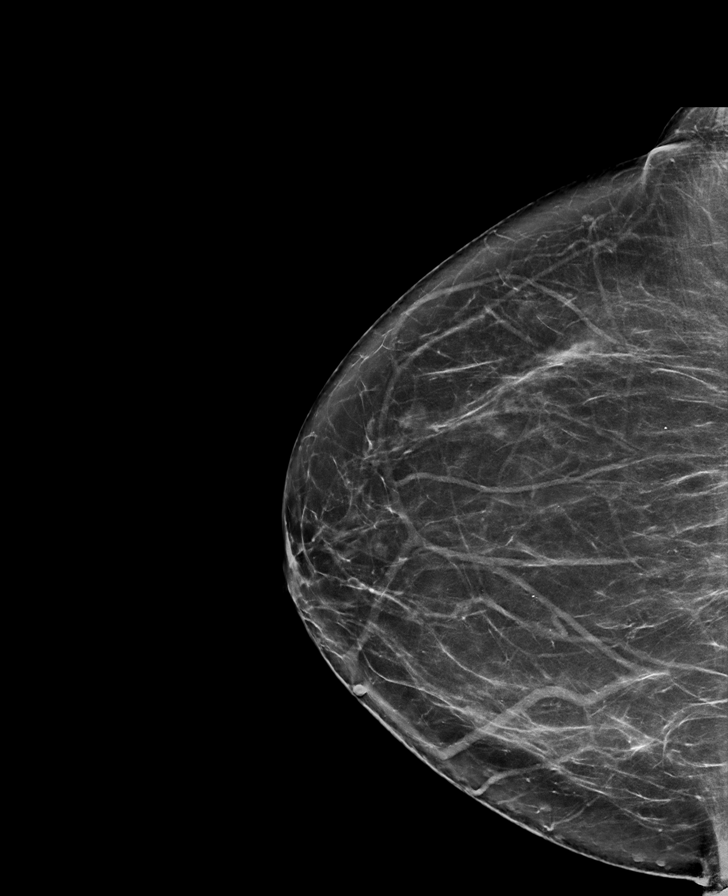

[L MLO synth-2D]
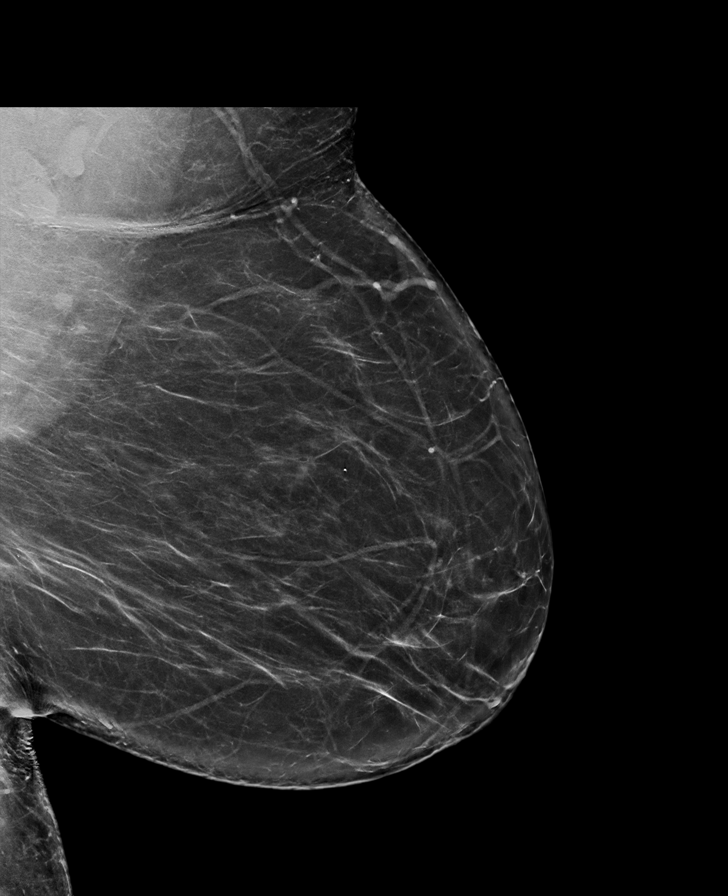

[L CC synth-2D]
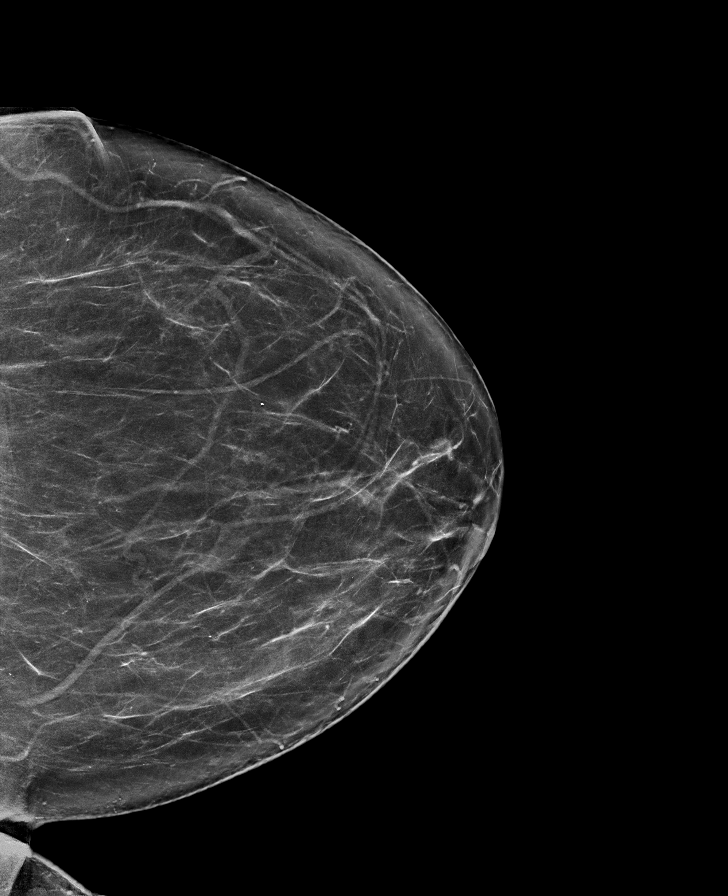

[R MLO synth-2D]
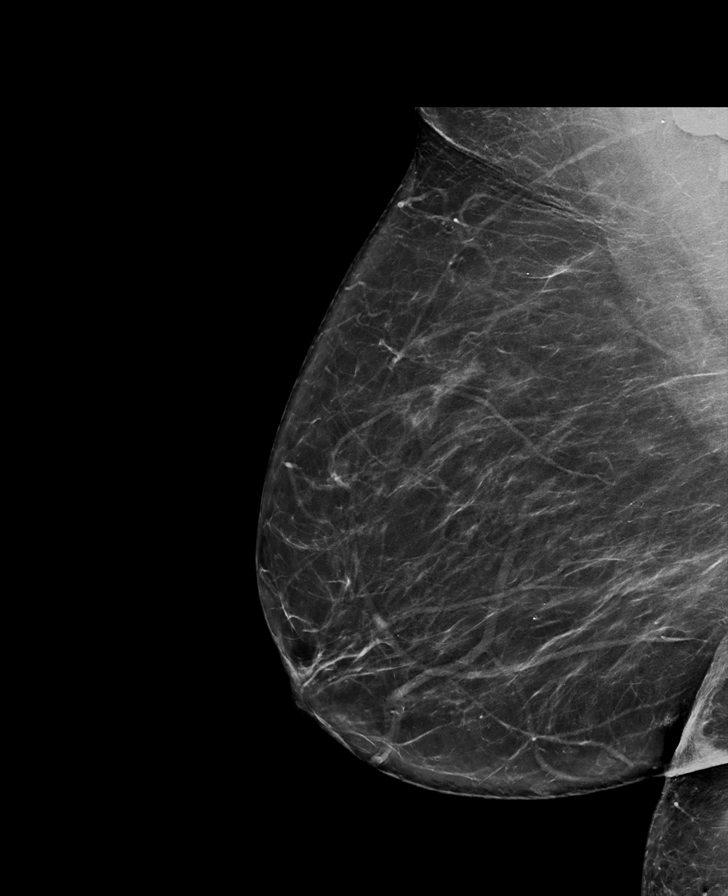

[L MLO tomo · tomo slice 43/85.0]
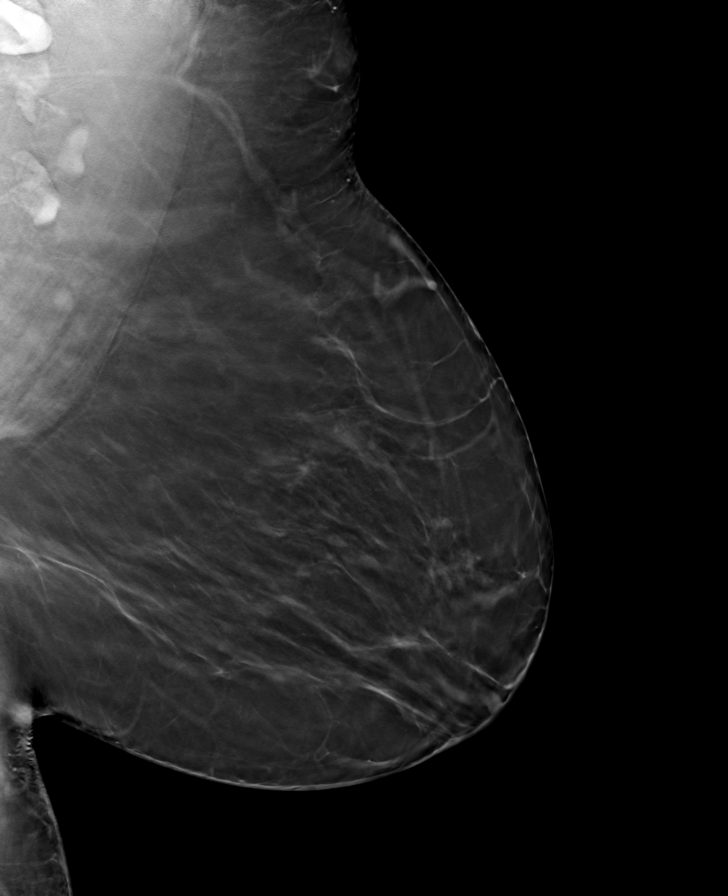

[R MLO tomo · tomo slice 43/85.0]
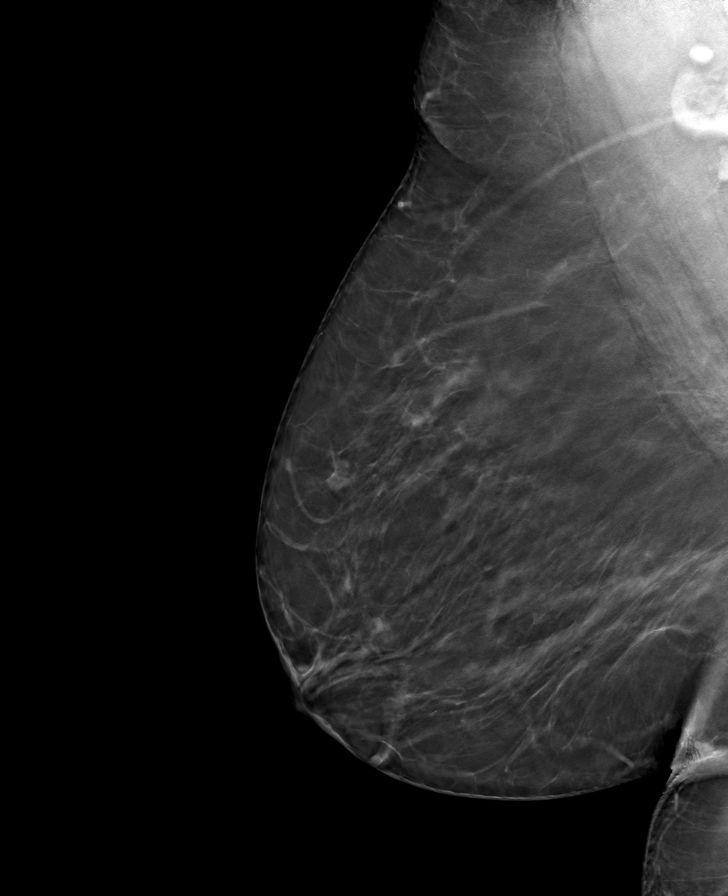

[R CC tomo · tomo slice 41/80.0]
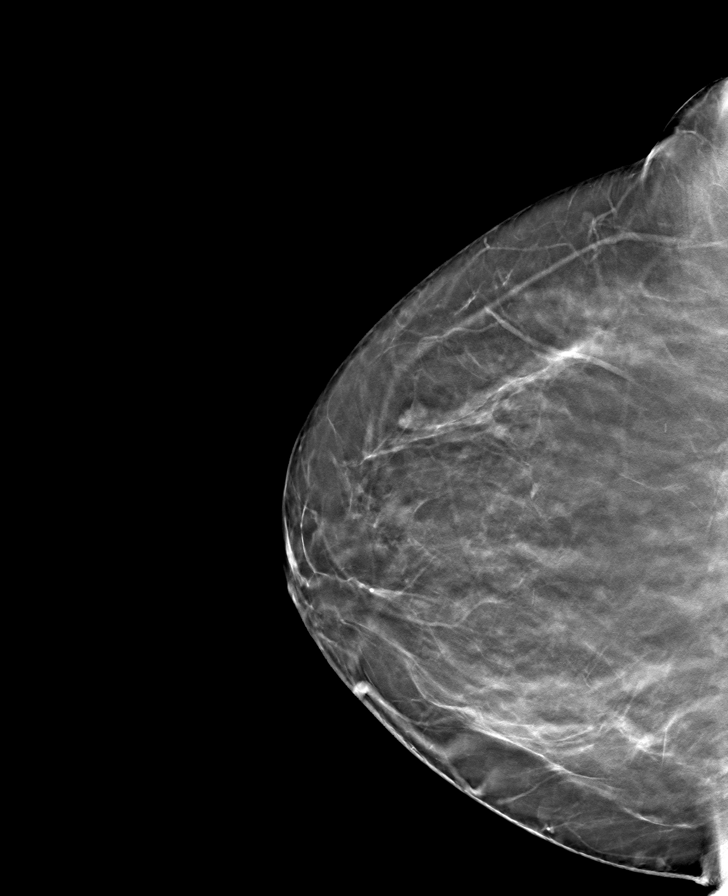

[L CC tomo · tomo slice 41/81.0]
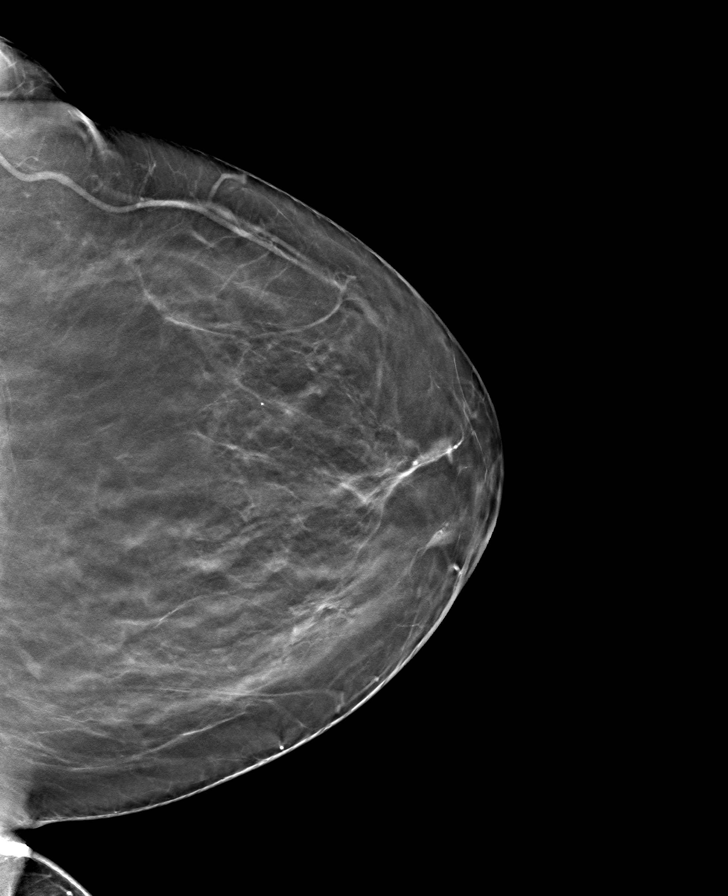

[8 of 24 positions shown; findings below may reference images not displayed]

ACR Breast Density Category b: There are scattered areas of
fibroglandular density.
FINDINGS: Oval, mildly irregular, circumscribed mass in the anterior upper
outer right breast. No findings elsewhere in either breast
suspicious malignancy.

On physical exam, no mass palpable in the upper outer right breast
or right axilla.

Targeted ultrasound is performed, showing a 9 x 7 x 4 mm
horizontally oriented, mildly irregular mass in the 10:30 o'clock
position of the right breast, 5 cm from the nipple. This corresponds
to the mammographic mass. Some of the margins are circumscribed and
others are slightly indistinct. No internal blood flow was seen with
power Doppler.

Ultrasound of the right axilla demonstrated a single right axillary
lymph node with eccentric cortical thickening measuring up to 6.4 mm
in thickness.
IMPRESSION: 1. 9 mm indeterminate mass in the 10:30 o'clock position of the
right breast.
2. Single right axillary lymph node with abnormal cortical
thickening, suspicious for a metastatic or reactive node.

RECOMMENDATION:
1. Ultrasound-guided core needle biopsy of the 9 mm mass in the
10:30 o'clock position of the right breast.
2. Ultrasound-guided core needle biopsy of right axillary lymph node
with abnormal cortical thickening. This has been discussed with the
patient and she will be assisted in getting the biopsies scheduled.

I have discussed the findings and recommendations with the patient.
If applicable, a reminder letter will be sent to the patient
regarding the next appointment.

BI-RADS CATEGORY  4: Suspicious.

## 2021-09-25 IMAGING — US US BREAST*R* LIMITED INC AXILLA
1 series · 13 of 15 positions shown · non-contrast
Comparison: Previous exam(s).

CLINICAL DATA: Intermittent pain and tenderness in the outer left
breast and intermittent pain in the upper left breast, improved.

EXAM:
DIGITAL DIAGNOSTIC BILATERAL MAMMOGRAM WITH TOMOSYNTHESIS AND CAD;
ULTRASOUND RIGHT BREAST LIMITED
TECHNIQUE: Bilateral digital diagnostic mammography and breast tomosynthesis
was performed. The images were evaluated with computer-aided
detection.; Targeted ultrasound examination of the right breast was
performed

[Series 1: us breast*right* limited inc axilla · 0.07mm/px · 13 of 15 slices shown]
[im 1/15]
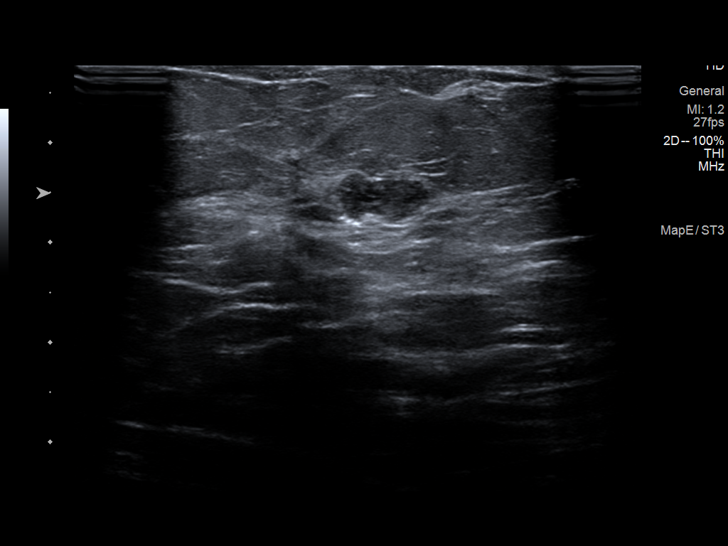
[im 2/15]
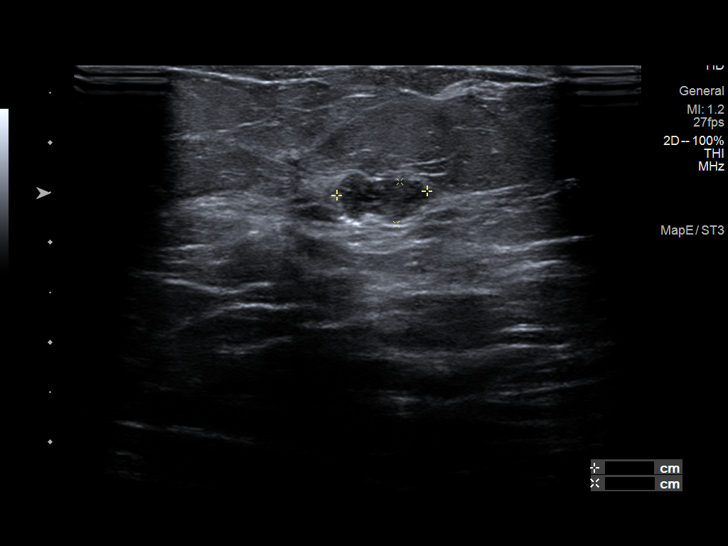
[im 3/15]
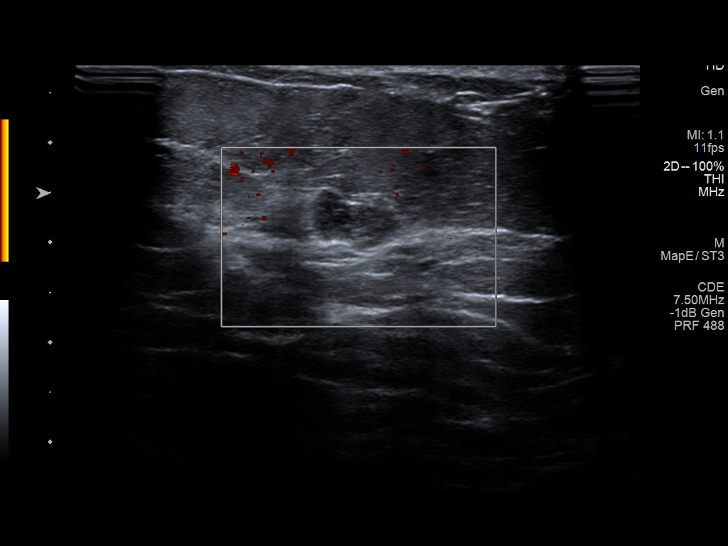
[im 5/15]
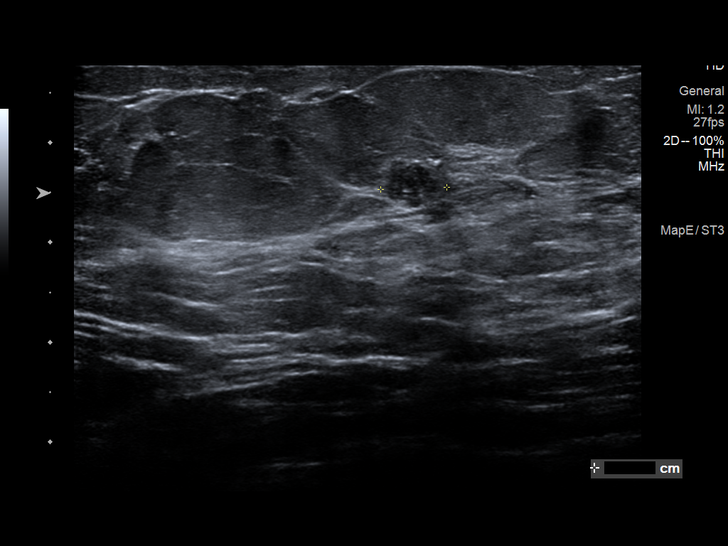
[im 6/15]
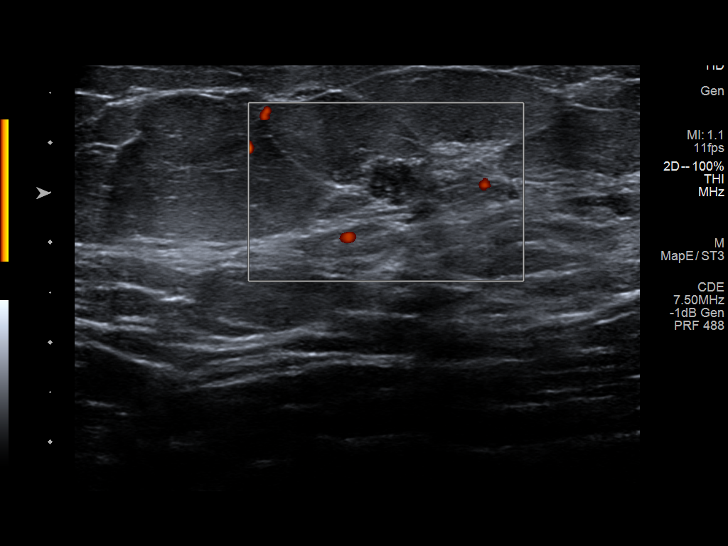
[im 7/15]
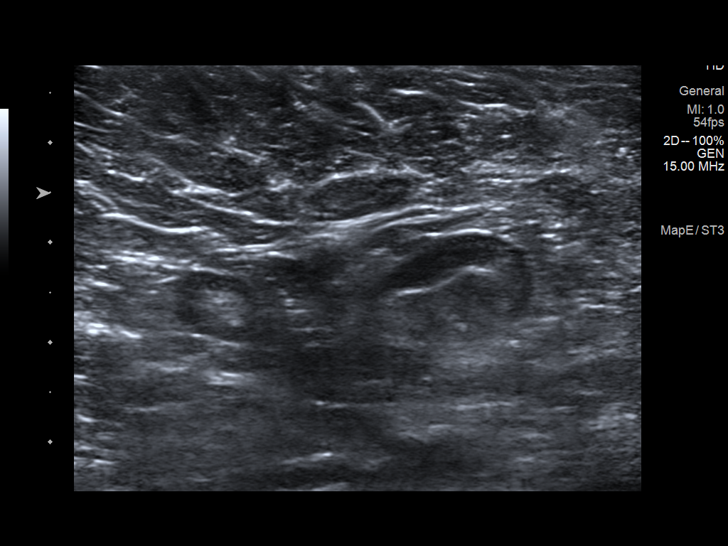
[im 8/15]
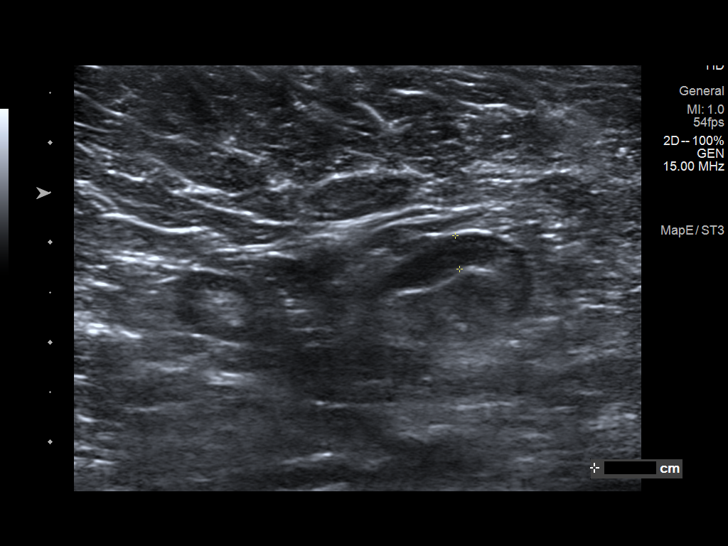
[im 9/15]
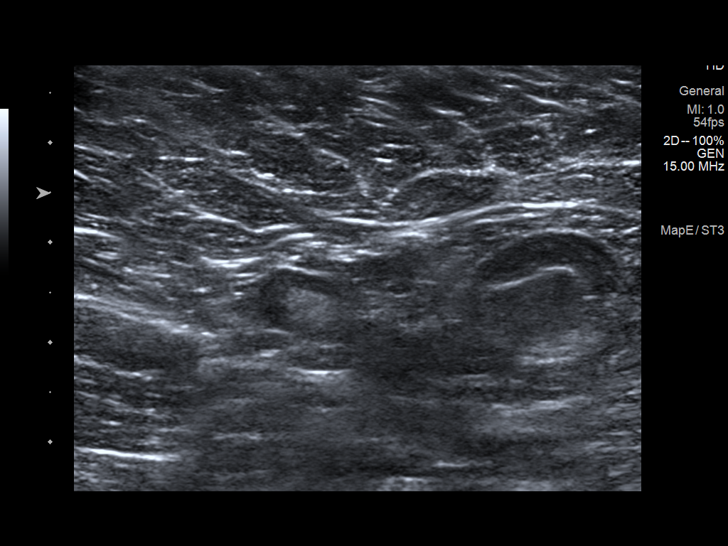
[im 10/15]
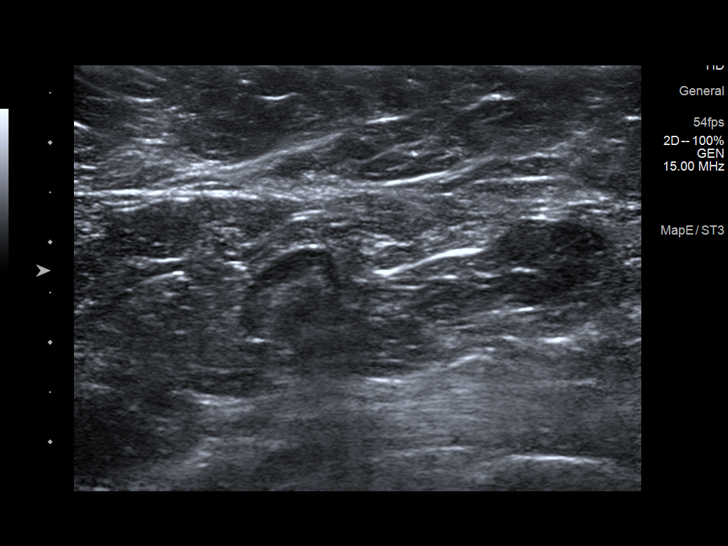
[im 11/15]
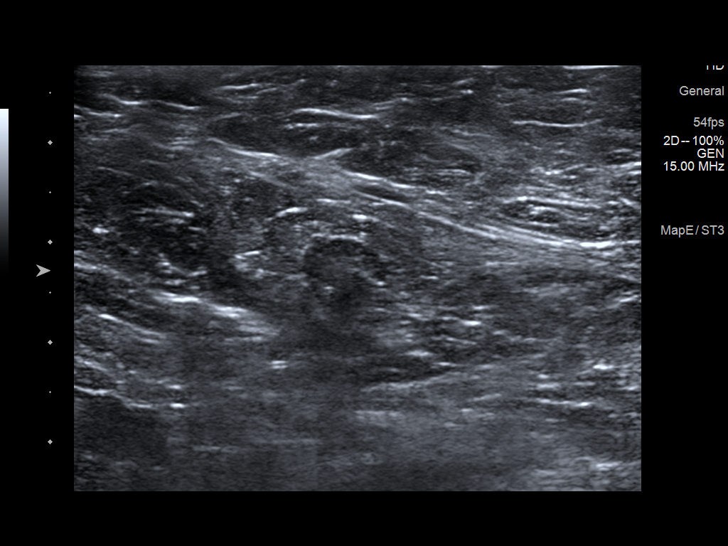
[im 13/15]
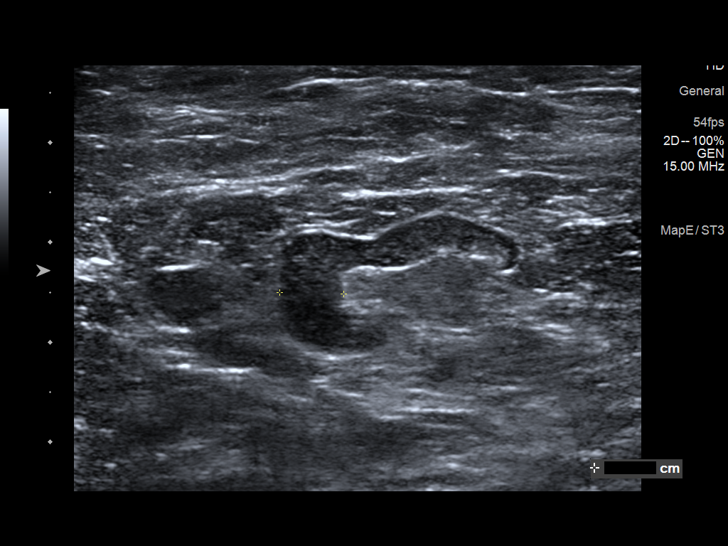
[im 14/15]
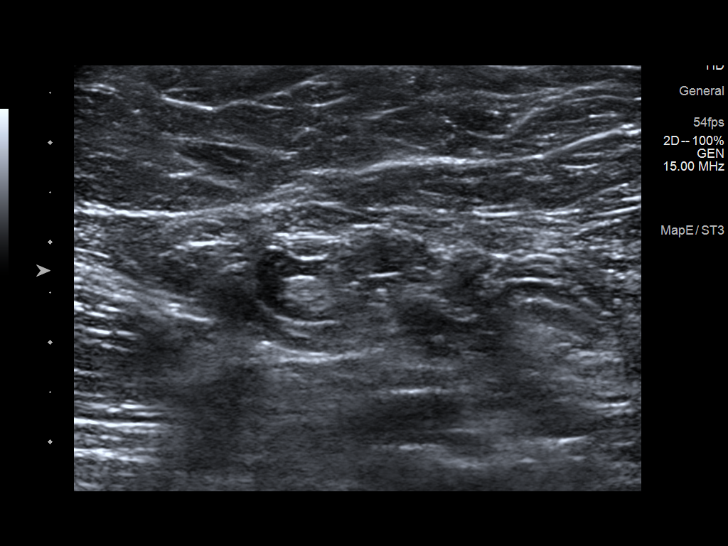
[im 15/15]
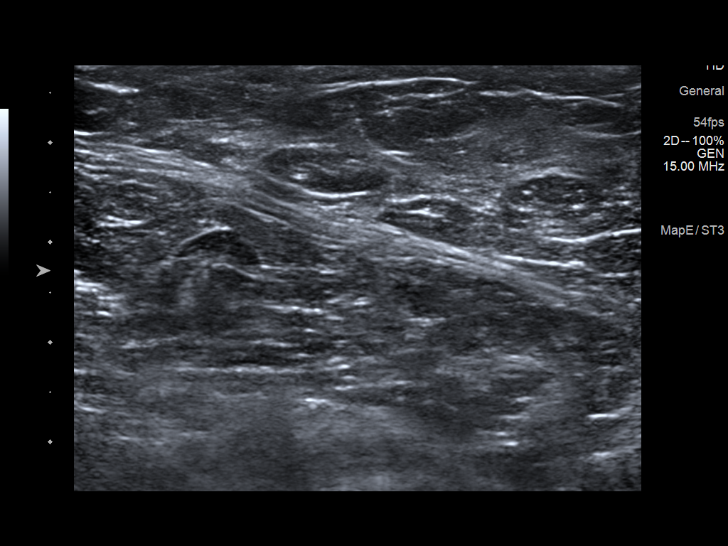

[13 of 15 positions shown; findings below may reference images not displayed]

ACR Breast Density Category b: There are scattered areas of
fibroglandular density.
FINDINGS: Oval, mildly irregular, circumscribed mass in the anterior upper
outer right breast. No findings elsewhere in either breast
suspicious malignancy.

On physical exam, no mass palpable in the upper outer right breast
or right axilla.

Targeted ultrasound is performed, showing a 9 x 7 x 4 mm
horizontally oriented, mildly irregular mass in the 10:30 o'clock
position of the right breast, 5 cm from the nipple. This corresponds
to the mammographic mass. Some of the margins are circumscribed and
others are slightly indistinct. No internal blood flow was seen with
power Doppler.

Ultrasound of the right axilla demonstrated a single right axillary
lymph node with eccentric cortical thickening measuring up to 6.4 mm
in thickness.
IMPRESSION: 1. 9 mm indeterminate mass in the 10:30 o'clock position of the
right breast.
2. Single right axillary lymph node with abnormal cortical
thickening, suspicious for a metastatic or reactive node.

RECOMMENDATION:
1. Ultrasound-guided core needle biopsy of the 9 mm mass in the
10:30 o'clock position of the right breast.
2. Ultrasound-guided core needle biopsy of right axillary lymph node
with abnormal cortical thickening. This has been discussed with the
patient and she will be assisted in getting the biopsies scheduled.

I have discussed the findings and recommendations with the patient.
If applicable, a reminder letter will be sent to the patient
regarding the next appointment.

BI-RADS CATEGORY  4: Suspicious.

## 2021-09-26 DIAGNOSIS — R1084 Generalized abdominal pain: Secondary | ICD-10-CM | POA: Diagnosis not present

## 2021-09-26 DIAGNOSIS — R5383 Other fatigue: Secondary | ICD-10-CM | POA: Diagnosis not present

## 2021-09-28 ENCOUNTER — Other Ambulatory Visit: Payer: Medicare PPO

## 2021-09-30 DIAGNOSIS — E119 Type 2 diabetes mellitus without complications: Secondary | ICD-10-CM | POA: Diagnosis not present

## 2021-09-30 DIAGNOSIS — I10 Essential (primary) hypertension: Secondary | ICD-10-CM | POA: Diagnosis not present

## 2021-09-30 DIAGNOSIS — M170 Bilateral primary osteoarthritis of knee: Secondary | ICD-10-CM | POA: Diagnosis not present

## 2021-09-30 DIAGNOSIS — E559 Vitamin D deficiency, unspecified: Secondary | ICD-10-CM | POA: Diagnosis not present

## 2021-09-30 DIAGNOSIS — E785 Hyperlipidemia, unspecified: Secondary | ICD-10-CM | POA: Diagnosis not present

## 2021-10-14 ENCOUNTER — Other Ambulatory Visit: Payer: Self-pay

## 2021-10-14 ENCOUNTER — Emergency Department (HOSPITAL_COMMUNITY): Payer: Medicare PPO

## 2021-10-14 ENCOUNTER — Inpatient Hospital Stay: Admission: RE | Admit: 2021-10-14 | Payer: Medicare PPO | Source: Ambulatory Visit

## 2021-10-14 ENCOUNTER — Other Ambulatory Visit: Payer: Medicare PPO

## 2021-10-14 ENCOUNTER — Emergency Department (HOSPITAL_COMMUNITY)
Admission: EM | Admit: 2021-10-14 | Discharge: 2021-10-14 | Discharge status: Against Medical Advice | Payer: Medicare PPO | Attending: Emergency Medicine | Admitting: Emergency Medicine

## 2021-10-14 DIAGNOSIS — R200 Anesthesia of skin: Secondary | ICD-10-CM | POA: Diagnosis not present

## 2021-10-14 DIAGNOSIS — E119 Type 2 diabetes mellitus without complications: Secondary | ICD-10-CM | POA: Diagnosis not present

## 2021-10-14 DIAGNOSIS — J320 Chronic maxillary sinusitis: Secondary | ICD-10-CM | POA: Diagnosis not present

## 2021-10-14 DIAGNOSIS — D509 Iron deficiency anemia, unspecified: Secondary | ICD-10-CM | POA: Insufficient documentation

## 2021-10-14 DIAGNOSIS — R202 Paresthesia of skin: Secondary | ICD-10-CM | POA: Diagnosis not present

## 2021-10-14 DIAGNOSIS — Z5321 Procedure and treatment not carried out due to patient leaving prior to being seen by health care provider: Secondary | ICD-10-CM | POA: Insufficient documentation

## 2021-10-14 DIAGNOSIS — E785 Hyperlipidemia, unspecified: Secondary | ICD-10-CM | POA: Diagnosis not present

## 2021-10-14 DIAGNOSIS — R600 Localized edema: Secondary | ICD-10-CM | POA: Diagnosis not present

## 2021-10-14 DIAGNOSIS — R519 Headache, unspecified: Secondary | ICD-10-CM | POA: Diagnosis present

## 2021-10-14 DIAGNOSIS — R29818 Other symptoms and signs involving the nervous system: Secondary | ICD-10-CM | POA: Diagnosis not present

## 2021-10-14 DIAGNOSIS — G43909 Migraine, unspecified, not intractable, without status migrainosus: Secondary | ICD-10-CM | POA: Diagnosis not present

## 2021-10-14 DIAGNOSIS — R220 Localized swelling, mass and lump, head: Secondary | ICD-10-CM | POA: Diagnosis not present

## 2021-10-14 DIAGNOSIS — I10 Essential (primary) hypertension: Secondary | ICD-10-CM | POA: Insufficient documentation

## 2021-10-14 LAB — CBC
HCT: 39.1 % (ref 36.0–46.0)
Hemoglobin: 13.1 g/dL (ref 12.0–15.0)
MCH: 26 pg (ref 26.0–34.0)
MCHC: 33.5 g/dL (ref 30.0–36.0)
MCV: 77.6 fL — ABNORMAL LOW (ref 80.0–100.0)
Platelets: 295 10*3/uL (ref 150–400)
RBC: 5.04 MIL/uL (ref 3.87–5.11)
RDW: 13.7 % (ref 11.5–15.5)
WBC: 10.9 10*3/uL — ABNORMAL HIGH (ref 4.0–10.5)
nRBC: 0 % (ref 0.0–0.2)

## 2021-10-14 LAB — DIFFERENTIAL
Abs Immature Granulocytes: 0.05 K/uL (ref 0.00–0.07)
Basophils Absolute: 0.1 K/uL (ref 0.0–0.1)
Basophils Relative: 1 %
Eosinophils Absolute: 0.2 K/uL (ref 0.0–0.5)
Eosinophils Relative: 2 %
Immature Granulocytes: 1 %
Lymphocytes Relative: 19 %
Lymphs Abs: 2.1 K/uL (ref 0.7–4.0)
Monocytes Absolute: 0.7 K/uL (ref 0.1–1.0)
Monocytes Relative: 6 %
Neutro Abs: 7.9 K/uL — ABNORMAL HIGH (ref 1.7–7.7)
Neutrophils Relative %: 71 %

## 2021-10-14 LAB — I-STAT CHEM 8, ED
BUN: 20 mg/dL (ref 8–23)
Calcium, Ion: 1.05 mmol/L — ABNORMAL LOW (ref 1.15–1.40)
Chloride: 102 mmol/L (ref 98–111)
Creatinine, Ser: 0.8 mg/dL (ref 0.44–1.00)
Glucose, Bld: 112 mg/dL — ABNORMAL HIGH (ref 70–99)
HCT: 39 % (ref 36.0–46.0)
Hemoglobin: 13.3 g/dL (ref 12.0–15.0)
Potassium: 4.1 mmol/L (ref 3.5–5.1)
Sodium: 138 mmol/L (ref 135–145)
TCO2: 29 mmol/L (ref 22–32)

## 2021-10-14 LAB — COMPREHENSIVE METABOLIC PANEL
ALT: 17 U/L (ref 0–44)
AST: 16 U/L (ref 15–41)
Albumin: 3.3 g/dL — ABNORMAL LOW (ref 3.5–5.0)
Alkaline Phosphatase: 86 U/L (ref 38–126)
Anion gap: 7 (ref 5–15)
BUN: 18 mg/dL (ref 8–23)
CO2: 28 mmol/L (ref 22–32)
Calcium: 8.8 mg/dL — ABNORMAL LOW (ref 8.9–10.3)
Chloride: 103 mmol/L (ref 98–111)
Creatinine, Ser: 0.92 mg/dL (ref 0.44–1.00)
GFR, Estimated: >60 mL/min (ref >60)
Glucose, Bld: 115 mg/dL — ABNORMAL HIGH (ref 70–99)
Potassium: 4.3 mmol/L (ref 3.5–5.1)
Sodium: 138 mmol/L (ref 135–145)
Total Bilirubin: 0.3 mg/dL (ref 0.3–1.2)
Total Protein: 6.9 g/dL (ref 6.5–8.1)

## 2021-10-14 LAB — PROTIME-INR
INR: 1 (ref 0.8–1.2)
Prothrombin Time: 12.9 seconds (ref 11.4–15.2)

## 2021-10-14 LAB — APTT: aPTT: 31 seconds (ref 24–36)

## 2021-10-14 IMAGING — CT CT HEAD W/O CM
4 series · 16 of 47 positions shown, 18 images · non-contrast
Comparison: CT head [DATE]

CLINICAL DATA: Neuro deficit, acute, stroke suspected. Left facial
tingling Under eye numbness



[Series 3: head wo · axial · 0.44mm/px · z∈[+1109,+1229]mm · 7 of 32 slices shown, 9 images]
[im 4/32  brain]
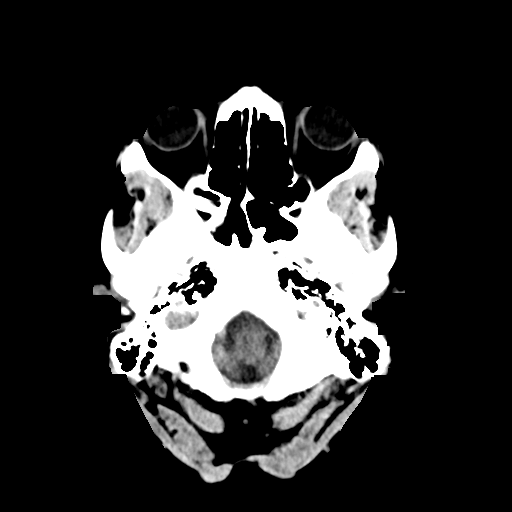
[im 4/32  bone]
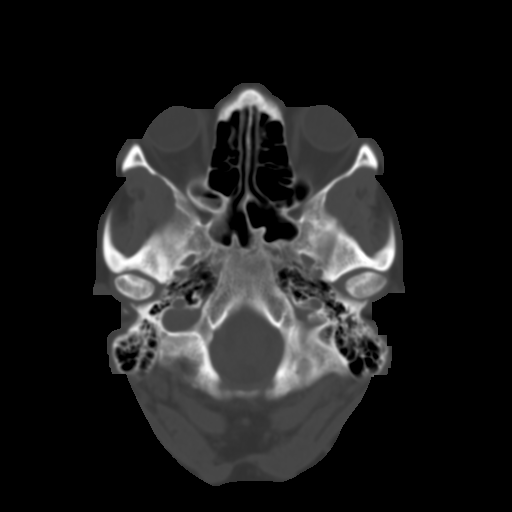
[im 8/32  brain]
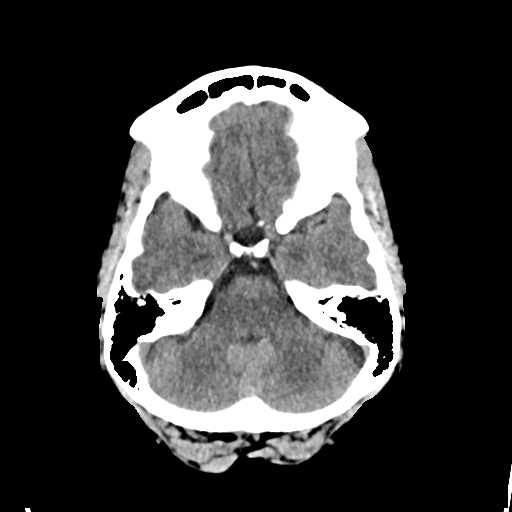
[im 12/32  brain]
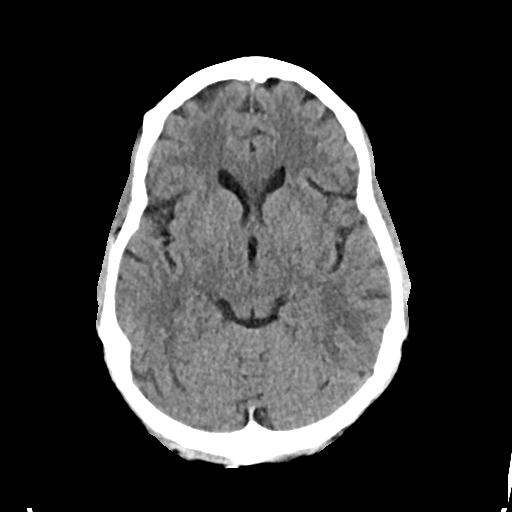
[im 16/32  brain]
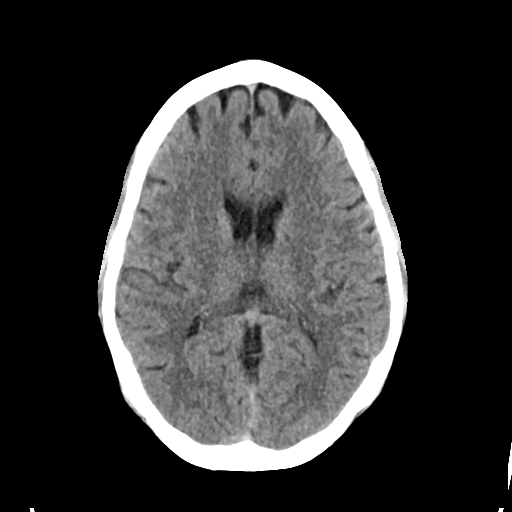
[im 20/32  brain]
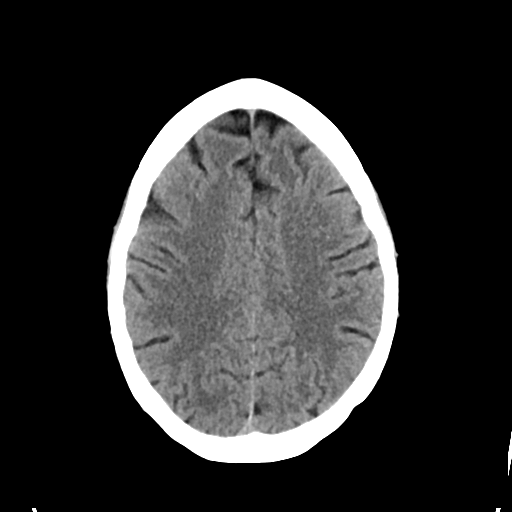
[im 20/32  bone]
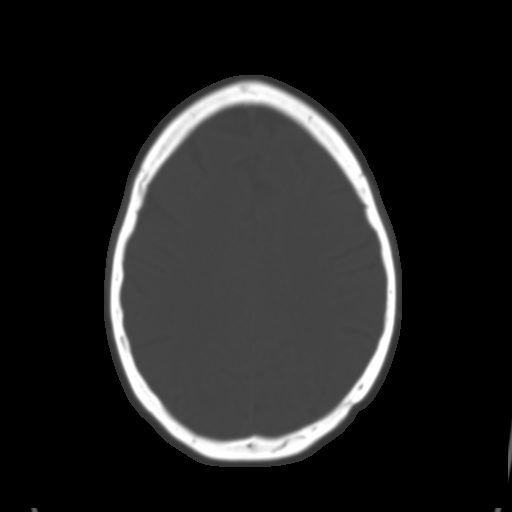
[im 24/32  brain]
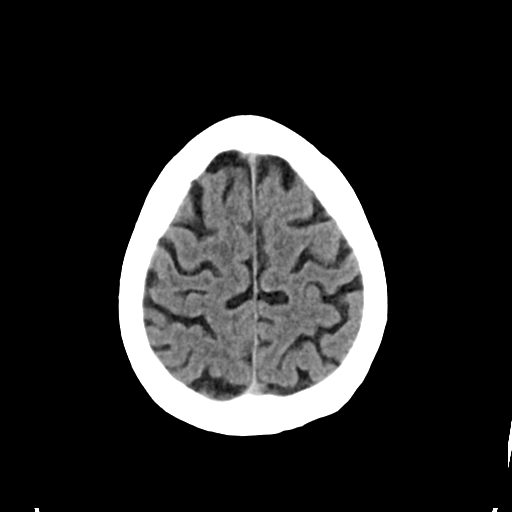
[im 28/32  brain]
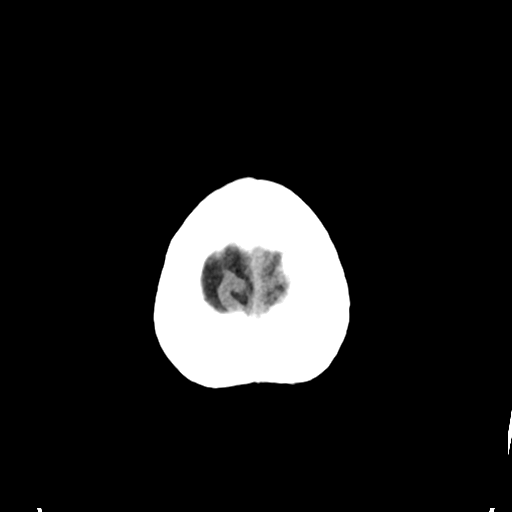

[Series 4: head bone · axial · 0.44mm/px · z∈[+1108,+1140]mm · 3 of 80 slices shown]
[im 8/80  bone]
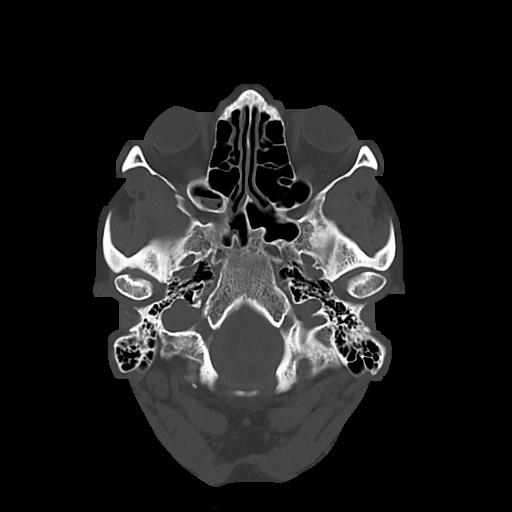
[im 16/80  bone]
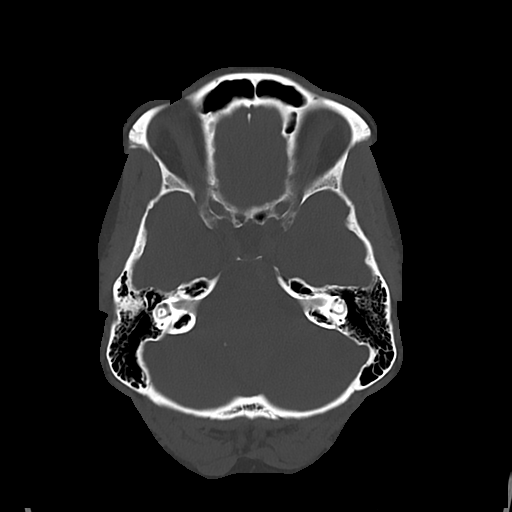
[im 24/80  bone]
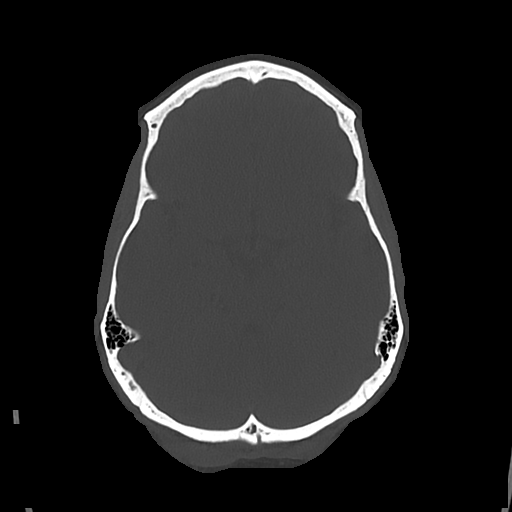

[Series 5: cor soft · coronal · 0.29mm/px · 3 of 66 slices shown]
[im 22/66  brain]
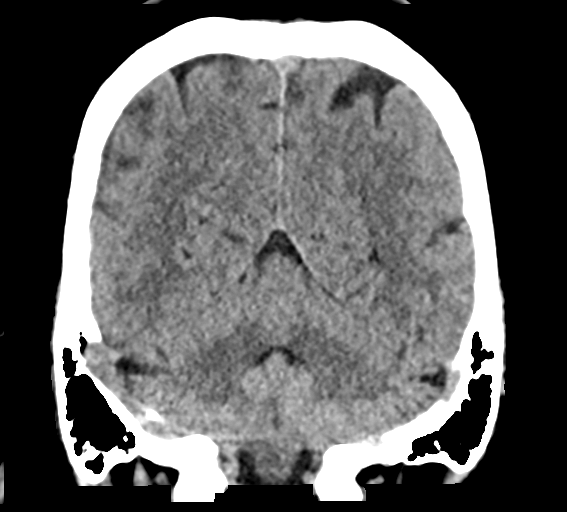
[im 29/66  brain]
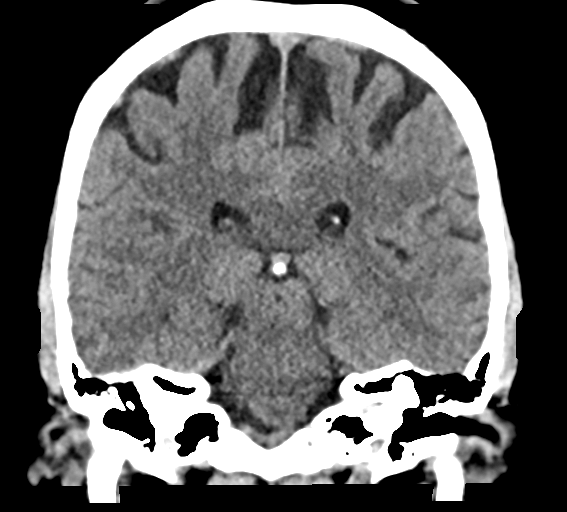
[im 37/66  brain]
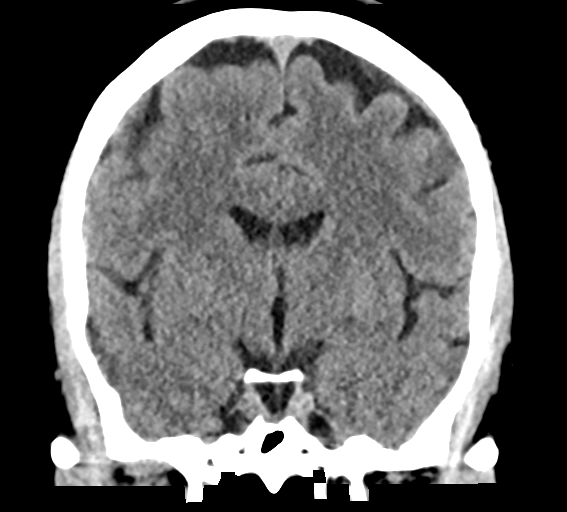

[Series 6: sag soft · sagittal · 0.33mm/px · 3 of 56 slices shown]
[im 19/56  brain]
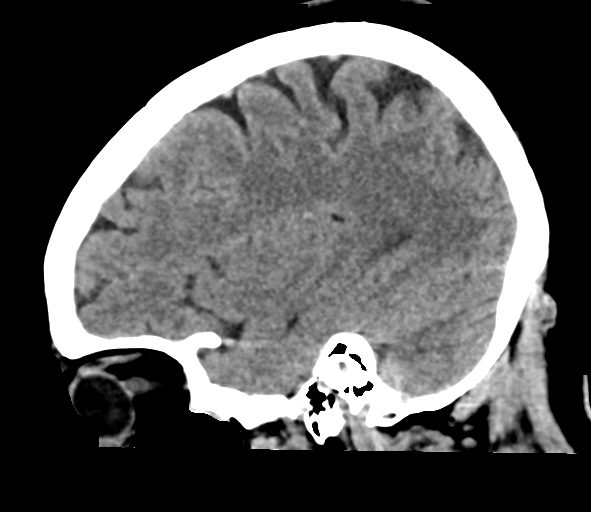
[im 28/56  brain]
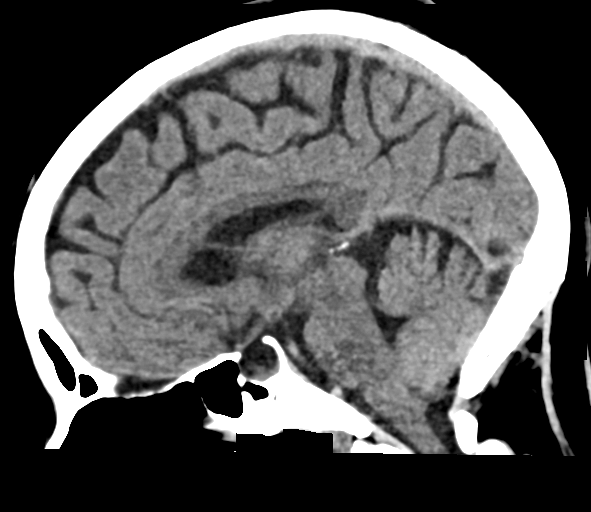
[im 37/56  brain]
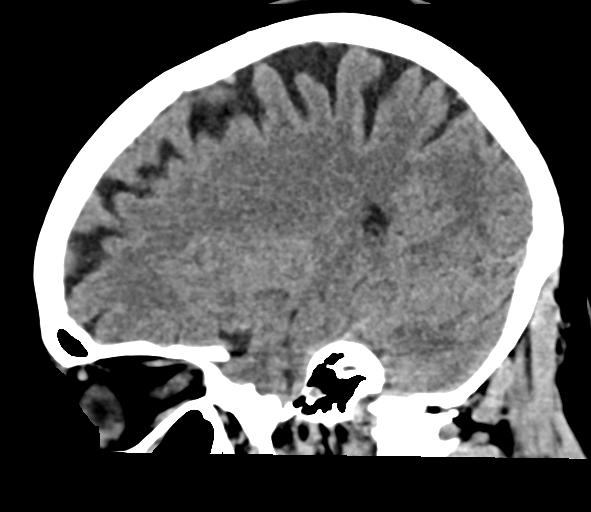

[16 of 47 positions shown; findings below may reference images not displayed]

BRAIN:
BRAIN
Trace patchy areas of decreased attenuation are noted throughout the
deep and periventricular white matter of the cerebral hemispheres
bilaterally, compatible with chronic microvascular ischemic disease.
No evidence of large-territorial acute infarction. No parenchymal
hemorrhage. No mass lesion. No extra-axial collection.

No mass effect or midline shift. No hydrocephalus. Basilar cisterns
are patent.

Vascular: No hyperdense vessel.

Skull: No acute fracture or focal lesion.

Sinuses/Orbits: Right maxillary mucosal thickening with associated
right maxillary wall hypertrophy. Paranasal sinuses and mastoid air
cells are clear. The orbits are unremarkable.

Other: None.
IMPRESSION: 1. No acute intracranial abnormality.
2. Chronic right maxillary sinusitis.

## 2021-10-14 NOTE — ED Notes (Signed)
Pt stated "I have not ate since I got here and can't wait any longer." Pt was told by NT to stay to see the Dr. Pt left AMA.

## 2021-10-14 NOTE — ED Provider Triage Note (Signed)
Emergency Medicine Provider Triage Evaluation Note  Bianca Medina , a 71 y.o. female  was evaluated in triage.  Pt complains of left-sided facial numbness and swelling since 4 AM this morning.  Tried taking 1 meloxicam, which provided some relief.  Denies other neuro deficits, N/V/D, abdominal pain, chest pain, shortness of breath, fevers, recent infections.  No prior CVA or TIA like symptoms.  Not on anticoagulation.  Hx of migraines, HTN, hyperlipidemia, DMT2, IDA, sensorineural hearing loss  Review of Systems  Positive:  Negative: As above  Physical Exam  BP 126/68 (BP Location: Right Arm)   Pulse 69   Temp 98.7 F (37.1 C) (Oral)   Resp 16   SpO2 97%  Gen:   Awake, no distress   Resp:  Normal effort, CTAB MSK:   Moves extremities without difficulty  Other:  Mild subjective left-sided facial numbness and mild left-sided facial edema.  No pronator drift, aphasia, abnormal EOMs, discoordination, disequilibrium appreciated.  Medical Decision Making  Medically screening exam initiated at 6:13 PM.  Appropriate orders placed.  Bianca Medina was informed that the remainder of the evaluation will be completed by another provider, this initial triage assessment does not replace that evaluation, and the importance of remaining in the ED until their evaluation is complete.  Labs, imaging, EKG ordered   Bianca Medina, [place] [birth year] 1821

## 2021-10-14 NOTE — ED Triage Notes (Signed)
Pt here from home for L side facial numbness  that she woke up with today. Pt reports going to sleep feeling normal last night at 2300, woke up at 0300 ish to go to the bathroom & noticed it and c/o HA. Pt says she has been diagnosed with "facial asymmetry" but believes she is having a stroke. No other neuro deficits, no facial droop.

## 2021-10-15 ENCOUNTER — Telehealth: Payer: Self-pay | Admitting: Gastroenterology

## 2021-10-15 NOTE — Telephone Encounter (Signed)
Good Afternoon Dr. Havery Moros,  Patient called stating she wanted to switch care over to Dr. Loletha Carrow for both Endoscopy and colonoscopy procedures. Are you okay with Dr. Loletha Carrow taking on patients care?  Please advise.

## 2021-10-16 NOTE — Telephone Encounter (Signed)
This patient has already been made by Dr. Havery Moros in late March, which was a second opinion on the symptoms.  I do not think it would be in her best interest to change providers again, so I will politely decline the transfer of care.  Madelon Lips, MD

## 2021-10-16 NOTE — Telephone Encounter (Signed)
Bianca Medina thanks for taking a look.  [name] can you please let the patient know. Thanks

## 2021-10-17 NOTE — Telephone Encounter (Signed)
Called patient to advise and was unable to reach patient nor leave a voicemail due to voicemail being full.

## 2021-10-28 ENCOUNTER — Other Ambulatory Visit: Payer: Self-pay | Admitting: Family Medicine

## 2021-10-28 DIAGNOSIS — E559 Vitamin D deficiency, unspecified: Secondary | ICD-10-CM

## 2021-11-05 ENCOUNTER — Encounter: Payer: Self-pay | Admitting: Adult Health

## 2021-11-05 ENCOUNTER — Ambulatory Visit (INDEPENDENT_AMBULATORY_CARE_PROVIDER_SITE_OTHER): Payer: Medicare PPO | Admitting: Adult Health

## 2021-11-05 VITALS — BP 145/70 | HR 64 | Ht 65.0 in | Wt 271.0 lb

## 2021-11-05 DIAGNOSIS — L820 Inflamed seborrheic keratosis: Secondary | ICD-10-CM | POA: Diagnosis not present

## 2021-11-05 DIAGNOSIS — L821 Other seborrheic keratosis: Secondary | ICD-10-CM | POA: Diagnosis not present

## 2021-11-05 DIAGNOSIS — L720 Epidermal cyst: Secondary | ICD-10-CM | POA: Diagnosis not present

## 2021-11-05 DIAGNOSIS — R200 Anesthesia of skin: Secondary | ICD-10-CM | POA: Diagnosis not present

## 2021-11-05 DIAGNOSIS — L905 Scar conditions and fibrosis of skin: Secondary | ICD-10-CM | POA: Diagnosis not present

## 2021-11-05 DIAGNOSIS — Z8669 Personal history of other diseases of the nervous system and sense organs: Secondary | ICD-10-CM | POA: Diagnosis not present

## 2021-11-05 NOTE — Telephone Encounter (Signed)
Error

## 2021-11-05 NOTE — Patient Instructions (Addendum)
Will follow up with Dr. Brett Fairy to see if here are any oher tests need to be completed  Please let us know if these symptoms should worsen or if you would like to try any preventive medication   Please call us if you would like pursue sleep testing to evaluate for sleep apnea

## 2021-11-05 NOTE — Progress Notes (Signed)
[facility] [address] [place] [address] [phone]       OFFICE FOLLOW UP NOTE  [name] Date of Birth:  [birth year] Medical Record Number:  [id number]    Primary neurologist: Dr. Brett Fairy Reason for visit: Left facial symptoms    SUBJECTIVE:   CHIEF COMPLAINT:  Chief Complaint  Patient presents with   Follow-up    RM 3 Alone  Pt is well and stable, states things have been the same     HPI:   Bianca Medina is a 71 y.o. female with PMHx significant for migraine headaches, anemia, DM, HTN, HLD, morbid obesity and vertigo.  Previously followed by Dr. Jannifer Franklin for migraine/headaches and care was transferred to Dr. Brett Fairy once he retired.  Initial visit with Dr. Brett Fairy 01/27/2021 for possible underlying sleep apnea but did not pursue HST as recommended.  Returned on 08/04/2021 with complaints of transient left facial weakness, facial numbness, subjective facial puffiness and left ptosis of unknown etiology after extensive lab work and imaging negative.  Returned on 08/28/2021 with complaints of left eye being swollen and under pressure as well as again left facial numbness and slight facial asymmetry, was referred to ophthalmology.    Update 11/05/2021 JM: Patient returns for follow-up visit after prior visit 3 months ago with Dr. Brett Fairy. She was advised to f/u after completion of HST but she has not yet completed. She requested to keep todays visit to discuss continued transient left facial symptoms.  Reports similar symptoms as discussed at prior visit.  Reports transient left facial numbness and weakness 2-3 times weekly.  Typically starts at outer left eye, radiates down throw cheek and into lips, at times can affect teeth.  Denies any painful facial sensation.  Does occasionally have left-sided neck pain.  At times can last throughout the day and typically resolves without intervention.  Denies any associated headache but does take  meloxicam and aspirin daily.  She was previously treated for migraine headaches by Dr. Jannifer Franklin but denies any current issues with headaches or migraines.  Reports she was seen by ophthalmology a month ago and no concerning findings (unable to view via epic).  Unable to determine specific triggers of transient facial numbness/weakness. Denies any arm or leg symptoms.  Returns today for evaluation.      History from Dr. Edwena Felty prior [place] note on 08/28/2021 provided for reference purposes only Bianca Medina is a 71 y.o. AA female patient and seen here as a walk- in  self referral on 08/28/2021 , we just met 08-04-2021, and Bianca Medina reports that she still has a feeling of her left eye being swollen and and under pressure, she feels that it is protruding.    She still has not made an appointment with her PCP.     About the eyelid as well the upper and lower of the left eye are heavier puffy.  She also reports numbness and there may be a slight droop of the left angle of the mouth.  What I see from the outside however is a very symmetric shape of the eye normal extraocular movements, normal facial appearance.  After our last visit I had sent for a rheumatology panel which came back negative.  She feels however that she has some blurred vision to the left eye. Goldman scale resolution of 20/40 with corrective glasses.  last labs were showing high glucose , but this was a non fasting sample/  negative ANA and negative Ach  receptor Ab.  She has seen ophthalmologist: Dr. Laury Axon, MD. In [place]. ?   Not on Epic- no records located,. He is an optometrist...        Bianca Medina is a 71 y.o. AA female patient and seen here as ED referred patient , but she has not seen PCP - 08-05-2018 , this time for a new problem of facial numbness, and she reports a slow progression of facial droop on the left side of the face.  This is not present here today in PM, and she has not made an appointment with her  PCP.  She reports that the swelling the puffiness and the facial asymmetry is most noticeable in the morning.  I have noticed that she has more ptosis on the left than the right.  The left upper eyelid does straddles the pupil.  There may be a slight asymmetry in her smile that the nasal labial fold is actually better pronounced on the left side. She was first presenting to ED on 07-14-2021, had an MRI brain.  I quote from her second emergency visit on 02 August 2021 when the patient was seen at [facility] emergency room.  For some reason an EKG was done it was interpreted as showing possibly left ventricular hypertrophy.  The patient presents as a patient with diabetes mellitus, hypertension, hyperlipidemia, migraine history and with a chief complaint of left side left-sided facial numbness tingling and face facial asymmetry.  The left-sided facial numbness and tingling has been intermittent over the past weeks but on 17 March she woke up at 2 AM and noticed worsening in numbness or tingling of the left side.  She also felt that the vision was blurred but this affected both eyes.  By the time she was seen in the emergency room it was 10/5 and her symptoms had improved she reports that the numbness radiates into the left side of her neck but there was no facial asymmetry noticed by the attending physician.  His evaluation shows no cranial nerve deficits no dysarthria or facial asymmetry.  There was also no extremity abnormality noted equal strength, normal range of motion and no abnormal extremity sensory.       A work-up on 2 on February 27 included an MRI this was her first emergency room visit and was unremarkable.  No evidence of stroke or demyelination there was no bleeding brain bleed or tumor. COMPARISON:  Same-day noncontrast CT head, MR head 07/20/2019   FINDINGS: Brain: There is no evidence of acute intracranial hemorrhage, extra-axial fluid collection, or acute infarct    Parenchymal volume is normal. The ventricles are normal in size. Parenchymal signal is normal for age, with no significant burden of white matter microangiopathic change   There is no suspicious parenchymal signal abnormality. There is no mass lesion. There is no midline shift. The sella is mildly expanded and partially empty, unchanged.   Vascular: Normal flow voids.   Skull and upper cervical spine: Normal marrow signal.   Sinuses/Orbits: There is mucosal thickening in the right maxillary sinus with associated hyperostosis. The globes and orbits are unremarkable.   Other: There is a small right mastoid effusion.   IMPRESSION: Normal appearance of the brain with no acute intracranial pathology.         She was just seen in September 2022, at the time upon transfer of care ( indirectly per sleep consult ) from Dr Jannifer Franklin for a sleep consultation , in relation  to Headache .   Chief concern according to patient :  ' only sometimes do I wake with a headache, not severe, and once I take tylenol its goes away immediately. There is no nausea, no photophobia and no focal point that hurts. " She never got the HST , no follow up from sleep consult.  I have the pleasure of seeing FRANNY SELVAGE today, a right -handed Black or [place] American female with a possible sleep disorder.  She   has a past medical history of Anemia (2003), Asthma, Common migraine (06/23/2019), Diabetes mellitus without complication ([place]), GERD (gastroesophageal reflux disease), Hyperlipidemia, mild, Hypertension, NSVD (normal spontaneous vaginal delivery), PMDD (premenstrual dysphoric disorder), morbid obesity. Vertigo recently, tinnitus- on meclizine.  Mrs. Paradise had undergone a sleep study on December 12, 2013 performed at [facility], under the guidance of Dr. Baird Lyons.  She had a really poor sleep efficiency the total recording time was 412 minutes and the total sleep time 174 minutes so this was 42.3%  indicating insomnia.  For the time that he actually slept she had an AHI of 11/h these were all hypopneas no frank apneas were seen.  She was described as snoring.  All hypopneas clustered in rem sleep.  This is most likely a manifestation of obesity hypoventilation and her lowest oxygen saturation was 80% SPO2 she spent 1.8 minutes with a oxygen saturation less than 88%.  She remained on room air at the course her apnea was so mild she was not split into a CPAP titration at night.  Her neurologic exam was entirely normal.  She carried a diagnosis of allergic rhinitis insulin resistance, perimenstrual mood disorder and morbid obesity.     Today's Epworth sleepiness score was endorsed at only 5 points out of 24 points in stark contrast to her Epworth score at the time of her sleep study from 7 years ago.  Then she was excessively daytime sleepy but she also attributed this to stress at her job.   She has since retired. Her blood pressure has been better controlled, but was still need to medicate.  PHQ indicated still depression to be present .     Sleep relevant medical history: Nocturia 2 times,  Tonsillectomy-none ,  wisdom teeth removed.  Wore a brace and retainers, mouth guard , bruxism.  Gasping for air. GERD    Family medical /sleep history: no  other family member with bruxism, but sister with insomnia, nobody on CPAP with OSA, insomnia.    Social history:  Patient is retired from office job, Engineer, maintenance (IT)-  and lives in a household alone. Family status is single , Pets are not  present.Tobacco use;never .  ETOH use ; none , Caffeine intake in form of Coffee( /) Soda( 1-2 a week) or Tea ( 1-2 / week) or energy drinks. Regular exercise in form of walking , non -swimmer.   Hobbies :arts and crafts.            ROS:   14 system review of systems performed and negative with exception of those listed in HPI  PMH:  Past Medical History:  Diagnosis Date   Adenomatous colon polyp 2021   Anemia 2003    IRON DEFICIENCY    Asthma    Chronic anal fissure    Common migraine 06/23/2019   Diabetes mellitus without complication (HCC)    GERD (gastroesophageal reflux disease)    Hearing loss    Hyperlipidemia, mild    Hypertension  IDA (iron deficiency anemia)    NSVD (normal spontaneous vaginal delivery)    X3   Obesity    PMDD (premenstrual dysphoric disorder)    Sensorineural hearing loss (SNHL) of both ears    Sleep disorder     PSH:  Past Surgical History:  Procedure Laterality Date   ABDOMINAL HYSTERECTOMY     BLADDER REPAIR     COLONOSCOPY  11/2019   1 hpp and 1 tubl adenoma   GUM SURGERY  1980's   MANDIBLE FRACTURE SURGERY     TUBAL LIGATION      Social History:  Social History   Socioeconomic History   Marital status: Single    Spouse name: Not on file   Number of children: 3   Years of education: college   Highest education level: Not on file  Occupational History   Occupation: Architectural technologist: [employer]  Tobacco Use   Smoking status: Never   Smokeless tobacco: Never  Vaping Use   Vaping Use: Never used  Substance and Sexual Activity   Alcohol use: Not Currently   Drug use: Never   Sexual activity: Not Currently    Birth control/protection: Surgical    Comment: HYSTERECTOMY-1st intercourse 52 yo-5 partners  Other Topics Concern   Not on file  Social History Narrative   Lives alone   Right handed     Caffeine use: soda/tea couple times a week   Social Determinants of Radio broadcast assistant Strain: Not on file  Food Insecurity: Not on file  Transportation Needs: Not on file  Physical Activity: Not on file  Stress: Not on file  Social Connections: Not on file  Intimate Partner Violence: Not on file    Family History:  Family History  Problem Relation Age of Onset   Cancer Mother        LUNG- SMOKER   Diabetes Father    Hypertension Father    Heart disease Father    Stroke Father     Cancer Paternal Aunt        COLON   Cancer Paternal Uncle        COLON   Breast cancer Sister     Medications:   Current Outpatient Medications on File Prior to Visit  Medication Sig Dispense Refill   aspirin EC 81 MG tablet Take 81 mg by mouth daily. Swallow whole.     Biotin 5000 MCG CAPS Take 5,000 mcg by mouth daily.      Casanthranol-Docusate Sodium (LAXATIVE PLUS STOOL SOFTENER PO) Take by mouth.     chlorthalidone (HYGROTON) 25 MG tablet Take 25 mg by mouth daily.     esomeprazole (NEXIUM) 40 MG capsule Take 40 mg by mouth daily.      fluticasone (FLONASE) 50 MCG/ACT nasal spray Place 2 sprays into both nostrils daily. 16 g 6   losartan (COZAAR) 25 MG tablet Take 25 mg by mouth daily.     magnesium oxide (MAG-OX) 400 (240 Mg) MG tablet Take 400 mg by mouth daily.     meloxicam (MOBIC) 15 MG tablet Take 15 mg by mouth daily.     metFORMIN (GLUCOPHAGE-XR) 500 MG 24 hr tablet TAKE 1 TABLET BY MOUTH EVERY DAY WITH BREAKFAST 90 tablet 1   potassium chloride SA (KLOR-CON M) 20 MEQ tablet Take 20 mEq by mouth once.     PROAIR HFA 108 (90 Base) MCG/ACT inhaler Inhale 1-2 puffs into the lungs every 4 (  four) hours as needed for wheezing or shortness of breath.      No current facility-administered medications on file prior to visit.    Allergies:   Allergies  Allergen Reactions   Codeine Other (See Comments)    nightmares Other reaction(s): Other nightmares nightmares      OBJECTIVE:  Physical Exam  Vitals:   11/05/21 1240  BP: (!) 145/70  Pulse: 64  Weight: 271 lb (122.9 kg)  Height: '5\' 5"'  (1.651 m)   Body mass index is 45.1 kg/m. No results found.   General: Morbidly obese pleasant elderly African-American female, seated, in no evident distress Head: head normocephalic and atraumatic.   Neck: supple with no carotid or supraclavicular bruits Cardiovascular: regular rate and rhythm, no murmurs Musculoskeletal: no deformity Skin:  no rash/petichiae Vascular:   Normal pulses all extremities   Neurologic Exam Mental Status: Awake and fully alert.  Fluent speech and language.  Oriented to place and time. Recent and remote memory intact. Attention span, concentration and fund of knowledge appropriate. Mood and affect appropriate.  Cranial Nerves: Pupils equal, briskly reactive to light. Extraocular movements full without nystagmus. Visual fields full to confrontation. Hearing intact. Facial sensation intact.  Unable to appreciate facial weakness on today's exam.  Tongue, and palate moves normally and symmetrically.  Motor: Normal bulk and tone. Normal strength in all tested extremity muscles Sensory.: intact to touch , pinprick , position and vibratory sensation.  Coordination: Rapid alternating movements normal in all extremities. Finger-to-nose and heel-to-shin performed accurately bilaterally. Gait and Station: Arises from chair without difficulty. Stance is wide-based. Gait demonstrates normal stride length and balance without use of AD.  Reflexes: 1+ and symmetric. Toes downgoing.         ASSESSMENT/PLAN: RAGINA FENTER is a 71 y.o. year old female with ongoing transient left facial numbness, weakness and eye pressure since around 06/2021.    Question if symptoms in setting of migraine headaches - does have hx of migraine headaches and extensive work-up otherwise reassuring Offered trial of migraine preventative medication but declines interest at this time Will f/u with Dr. Brett Fairy to ensure no further testing is indicated at this time  Discussed pursuing HST - she declines interest at this time.  She was advised to call if interested in pursuing in the future   No indication for follow-up as there are no further recommendations remain neurological standpoint at this time.      CC:  [facility] provider: Dr. Brett Fairy PCP: Libby Maw, MD    I spent 27 minutes of face-to-face and non-face-to-face time with patient.  This included  previsit chart review, lab review, study review, electronic health record documentation, patient education regarding transient left facial symptoms and possible etiology, possible treatment options, indication for completing HST and importance of pursuing, and answered all other questions to patient best option   Frann Rider, [facility]  [facility] [address] [place], [place] [address]  Phone [phone] Fax [phone] Note: This document was prepared with digital dictation and possible smart phrase technology. Any transcriptional errors that result from this process are unintentional.

## 2021-11-06 ENCOUNTER — Ambulatory Visit (HOSPITAL_COMMUNITY)
Admission: EM | Admit: 2021-11-06 | Discharge: 2021-11-06 | Disposition: A | Discharge status: Home / Self Care | Payer: Medicare PPO

## 2021-11-06 ENCOUNTER — Encounter (HOSPITAL_COMMUNITY): Payer: Self-pay

## 2021-11-06 DIAGNOSIS — M7989 Other specified soft tissue disorders: Secondary | ICD-10-CM | POA: Diagnosis not present

## 2021-11-06 NOTE — ED Triage Notes (Signed)
C/o left leg swelling and pain that started this morning around 2:00 am. No injuries or falls to report.

## 2021-11-06 NOTE — Discharge Instructions (Signed)
Please return to the urgent care if you feel your symptoms are not improving.  If you feel your symptoms have worsened, please go to the emergency department.

## 2021-11-06 NOTE — ED Provider Notes (Signed)
[place]    CSN: [id number] Arrival date & time: 11/06/21  1816      History   Chief Complaint Chief Complaint  Patient presents with   Leg Swelling    HPI Bianca Medina is a 71 y.o. female.  Presents with left leg swelling and pain that began suddenly this morning patient was sleeping and woke her up from sleep.  She takes a daily meloxicam and her morning dose helped with the pain.  States that the pain has subsided but still notes the area of swelling.  It is only painful when you press on the area.  Denies any injury or trauma to the leg.  No noted bites.  No history of blood clot.  Denies fevers or shortness of breath.  Past Medical History:  Diagnosis Date   Adenomatous colon polyp 2021   Anemia 2003   IRON DEFICIENCY    Asthma    Chronic anal fissure    Common migraine 06/23/2019   Diabetes mellitus without complication (HCC)    GERD (gastroesophageal reflux disease)    Hearing loss    Hyperlipidemia, mild    Hypertension    IDA (iron deficiency anemia)    NSVD (normal spontaneous vaginal delivery)    X3   Obesity    PMDD (premenstrual dysphoric disorder)    Sensorineural hearing loss (SNHL) of both ears    Sleep disorder     Patient Active Problem List   Diagnosis Date Noted   Bruising 06/11/2021   Other schizoaffective disorders ([place]) 04/30/2021   Thyroid nodule 04/30/2021   Screening for colon cancer 04/01/2021   Insect bite of right wrist 04/01/2021   Bite, insect 04/01/2021   Rash 03/28/2021   Iron deficiency 03/19/2021   Need for influenza vaccination 03/19/2021   Snoring 01/27/2021   Sleep related bruxism 01/27/2021   Gasping for breath 01/27/2021   Insomnia due to anxiety and fear 01/27/2021   Class 3 severe obesity due to excess calories with serious comorbidity and body mass index (BMI) of 40.0 to 44.9 in adult [facility]) 01/27/2021   Medicare annual wellness visit, subsequent 01/24/2021   Dehydration 12/26/2020   History of  syncope 12/26/2020   Hospital discharge follow-up 12/26/2020   Healthcare maintenance 12/01/2020   Vitamin D deficiency 12/01/2020   Ear itching 06/27/2019   Common migraine 06/23/2019   History of anemia 03/31/2019   Pre-diabetes 03/31/2019   Viral syndrome 03/31/2019   Sensorineural hearing loss (SNHL), bilateral 08/12/2017   Tinnitus of both ears 08/12/2017   Morbid obesity ([place]) 12/28/2016   Left breast mass 07/19/2014   Breast tenderness in female 07/19/2014   Menopause 01/09/2014   Vaginal atrophy 01/09/2014   Skin lesion 12/23/2012   Essential hypertension 10/31/2012   Heartburn 10/31/2012    Past Surgical History:  Procedure Laterality Date   ABDOMINAL HYSTERECTOMY     BLADDER REPAIR     COLONOSCOPY  11/2019   1 hpp and 1 tubl adenoma   GUM SURGERY  1980's   MANDIBLE FRACTURE SURGERY     TUBAL LIGATION      OB History     Gravida  3   Para  3   Term      Preterm      AB      Living  3      SAB      IAB      Ectopic      Multiple  Live Births               Home Medications    Prior to Admission medications   Medication Sig Start Date End Date Taking? Authorizing Provider  aspirin EC 81 MG tablet Take 81 mg by mouth daily. Swallow whole.    [provider]  Biotin 5000 MCG CAPS Take 5,000 mcg by mouth daily.     [provider]  Casanthranol-Docusate Sodium (LAXATIVE PLUS STOOL SOFTENER PO) Take by mouth.    [provider]  chlorthalidone (HYGROTON) 25 MG tablet Take 25 mg by mouth daily.    [provider]  esomeprazole (NEXIUM) 40 MG capsule Take 40 mg by mouth daily.     [provider]  fluticasone (FLONASE) 50 MCG/ACT nasal spray Place 2 sprays into both nostrils daily. 05/13/21   Libby Maw, MD  losartan (COZAAR) 25 MG tablet Take 25 mg by mouth daily. 07/23/21   [provider]  magnesium oxide (MAG-OX) 400 (240 Mg) MG tablet Take 400 mg by mouth daily.     [provider]  meloxicam (MOBIC) 15 MG tablet Take 15 mg by mouth daily. 07/03/21   [provider]  metFORMIN (GLUCOPHAGE-XR) 500 MG 24 hr tablet TAKE 1 TABLET BY MOUTH EVERY DAY WITH BREAKFAST 07/14/21   Libby Maw, MD  potassium chloride SA (KLOR-CON M) 20 MEQ tablet Take 20 mEq by mouth once.    [provider]  PROAIR HFA 108 [phone] Base) MCG/ACT inhaler Inhale 1-2 puffs into the lungs every 4 (four) hours as needed for wheezing or shortness of breath.  03/16/18   [provider]    Family History Family History  Problem Relation Age of Onset   Cancer Mother        LUNG- SMOKER   Diabetes Father    Hypertension Father    Heart disease Father    Stroke Father    Cancer Paternal Aunt        COLON   Cancer Paternal Uncle        COLON   Breast cancer Sister     Social History Social History   Tobacco Use   Smoking status: Never   Smokeless tobacco: Never  Vaping Use   Vaping Use: Never used  Substance Use Topics   Alcohol use: Not Currently   Drug use: Never     Allergies   Codeine   Review of Systems Review of Systems Per HPI  Physical Exam Triage Vital Signs ED Triage Vitals  Enc Vitals Group     BP 11/06/21 1849 138/73     Pulse Rate 11/06/21 1847 71     Resp 11/06/21 1847 16     Temp 11/06/21 1847 98.3 F (36.8 C)     Temp Source 11/06/21 1847 Oral     SpO2 11/06/21 1847 96 %     Weight --      Height --      Head Circumference --      Peak Flow --      Pain Score --      Pain Loc --      Pain Edu? --      Excl. in [place]? --    No data found.  Updated Vital Signs BP 138/73 (BP Location: Left Arm)   Pulse 71   Temp 98.3 F (36.8 C) (Oral)   Resp 16   SpO2 100%   Visual Acuity Right Eye Distance:  Left Eye Distance:   Bilateral Distance:    Right Eye Near:   Left Eye Near:    Bilateral Near:     Physical Exam Vitals and nursing note reviewed.  Constitutional:      Appearance: Normal  appearance.  HENT:     Mouth/Throat:     Mouth: Mucous membranes are moist.     Pharynx: Oropharynx is clear.  Eyes:     Conjunctiva/sclera: Conjunctivae normal.  Cardiovascular:     Rate and Rhythm: Normal rate and regular rhythm.     Pulses: Normal pulses.     Heart sounds: Normal heart sounds.  Pulmonary:     Effort: Pulmonary effort is normal.     Breath sounds: Normal breath sounds.  Musculoskeletal:        General: Normal range of motion.     Cervical back: Normal range of motion.     Comments: Small localized area of swelling on the anterior medial right leg.  Mild calf tenderness.  No skin changes or area of abscess, no warmth  Neurological:     General: No focal deficit present.     Mental Status: She is alert and oriented to person, place, and time.      UC Treatments / Results  Labs (all labs ordered are listed, but only abnormal results are displayed) Labs Reviewed - No data to display  EKG  Radiology No results found.  Procedures Procedures   Medications Ordered in UC Medications - No data to display  Initial Impression / Assessment and Plan / UC Course  I have reviewed the triage vital signs and the nursing notes.  Pertinent labs & imaging results that were available during my care of the patient were reviewed by me and considered in my medical decision making (see chart for details).  Very low concern for DVT.  Does not feel like there is underlying abscess, no warmth or skin changes.  Since pain has improved and only mild swelling to the anteromedial leg, discussed with patient we will watch and wait to see if her symptoms resolve on their own.  She is welcome to return to the urgent care if she feels no improvement. Worsening symptoms will require emergency department evaluation.   She can continue her daily meloxicam if this helps. Return precautions discussed. Patient agrees to plan and is discharged in stable condition.  Final Clinical  Impressions(s) / UC Diagnoses   Final diagnoses:  Right leg swelling     Discharge Instructions      Please return to the urgent care if you feel your symptoms are not improving.  If you feel your symptoms have worsened, please go to the emergency department.    ED Prescriptions   None    PDMP not reviewed this encounter.   Ketura Sirek, Vernice Jefferson 11/06/21 1949

## 2021-11-07 DIAGNOSIS — E785 Hyperlipidemia, unspecified: Secondary | ICD-10-CM | POA: Diagnosis not present

## 2021-11-07 DIAGNOSIS — I10 Essential (primary) hypertension: Secondary | ICD-10-CM | POA: Diagnosis not present

## 2021-11-07 DIAGNOSIS — E119 Type 2 diabetes mellitus without complications: Secondary | ICD-10-CM | POA: Diagnosis not present

## 2021-11-10 ENCOUNTER — Encounter: Payer: Self-pay | Admitting: Family Medicine

## 2021-11-10 ENCOUNTER — Ambulatory Visit (INDEPENDENT_AMBULATORY_CARE_PROVIDER_SITE_OTHER): Payer: Medicare PPO | Admitting: Family Medicine

## 2021-11-10 VITALS — BP 128/70 | HR 65 | Temp 97.0°F | Ht 65.0 in | Wt 270.2 lb

## 2021-11-10 DIAGNOSIS — R7303 Prediabetes: Secondary | ICD-10-CM | POA: Diagnosis not present

## 2021-11-10 DIAGNOSIS — E7800 Pure hypercholesterolemia, unspecified: Secondary | ICD-10-CM

## 2021-11-10 DIAGNOSIS — I10 Essential (primary) hypertension: Secondary | ICD-10-CM | POA: Diagnosis not present

## 2021-11-10 DIAGNOSIS — E785 Hyperlipidemia, unspecified: Secondary | ICD-10-CM | POA: Insufficient documentation

## 2021-11-10 DIAGNOSIS — M7989 Other specified soft tissue disorders: Secondary | ICD-10-CM | POA: Diagnosis not present

## 2021-11-10 DIAGNOSIS — F258 Other schizoaffective disorders: Secondary | ICD-10-CM | POA: Diagnosis not present

## 2021-11-10 LAB — HEMOGLOBIN A1C: Hgb A1c MFr Bld: 7 % — ABNORMAL HIGH (ref 4.6–6.5)

## 2021-11-10 MED ORDER — ATORVASTATIN CALCIUM 20 MG PO TABS: 20.0000 mg | ORAL_TABLET | Freq: Every day | ORAL | 3 refills | Status: DC

## 2021-11-12 ENCOUNTER — Other Ambulatory Visit (HOSPITAL_COMMUNITY): Payer: Self-pay | Admitting: Orthopedic Surgery

## 2021-11-12 ENCOUNTER — Encounter: Payer: Self-pay | Admitting: Family Medicine

## 2021-11-12 ENCOUNTER — Ambulatory Visit (HOSPITAL_COMMUNITY)
Admission: RE | Admit: 2021-11-12 | Discharge: 2021-11-12 | Disposition: A | Discharge status: Home / Self Care | Payer: Medicare PPO | Source: Ambulatory Visit | Attending: Orthopedic Surgery | Admitting: Orthopedic Surgery

## 2021-11-12 DIAGNOSIS — M7989 Other specified soft tissue disorders: Secondary | ICD-10-CM | POA: Diagnosis not present

## 2021-11-12 DIAGNOSIS — M79605 Pain in left leg: Secondary | ICD-10-CM | POA: Diagnosis not present

## 2021-11-12 DIAGNOSIS — M7052 Other bursitis of knee, left knee: Secondary | ICD-10-CM | POA: Diagnosis not present

## 2021-11-12 NOTE — Progress Notes (Signed)
Left lower extremity venous duplex completed. Refer to "CV Proc" under chart review to view preliminary results.  11/12/2021 4:23 PM Kelby Aline., MHA, RVT, RDCS, RDMS

## 2021-11-13 ENCOUNTER — Other Ambulatory Visit: Payer: Self-pay | Admitting: General Surgery

## 2021-11-13 DIAGNOSIS — Z86010 Personal history of colonic polyps: Secondary | ICD-10-CM | POA: Diagnosis not present

## 2021-11-13 DIAGNOSIS — D0511 Intraductal carcinoma in situ of right breast: Secondary | ICD-10-CM

## 2021-11-13 DIAGNOSIS — K625 Hemorrhage of anus and rectum: Secondary | ICD-10-CM | POA: Diagnosis not present

## 2021-11-15 ENCOUNTER — Encounter (HOSPITAL_COMMUNITY): Payer: Self-pay

## 2021-11-15 ENCOUNTER — Ambulatory Visit (HOSPITAL_COMMUNITY)
Admission: EM | Admit: 2021-11-15 | Discharge: 2021-11-15 | Disposition: A | Discharge status: Home / Self Care | Payer: Medicare PPO | Attending: Physician Assistant | Admitting: Physician Assistant

## 2021-11-15 ENCOUNTER — Other Ambulatory Visit: Payer: Self-pay

## 2021-11-15 ENCOUNTER — Ambulatory Visit (INDEPENDENT_AMBULATORY_CARE_PROVIDER_SITE_OTHER): Payer: Medicare PPO

## 2021-11-15 DIAGNOSIS — R0602 Shortness of breath: Secondary | ICD-10-CM | POA: Insufficient documentation

## 2021-11-15 DIAGNOSIS — R6889 Other general symptoms and signs: Secondary | ICD-10-CM | POA: Diagnosis not present

## 2021-11-15 LAB — COMPREHENSIVE METABOLIC PANEL
ALT: 19 U/L (ref 0–44)
AST: 16 U/L (ref 15–41)
Albumin: 3.3 g/dL — ABNORMAL LOW (ref 3.5–5.0)
Alkaline Phosphatase: 88 U/L (ref 38–126)
Anion gap: 12 (ref 5–15)
BUN: 17 mg/dL (ref 8–23)
CO2: 29 mmol/L (ref 22–32)
Calcium: 9.4 mg/dL (ref 8.9–10.3)
Chloride: 100 mmol/L (ref 98–111)
Creatinine, Ser: 0.86 mg/dL (ref 0.44–1.00)
GFR, Estimated: >60 mL/min (ref >60)
Glucose, Bld: 125 mg/dL — ABNORMAL HIGH (ref 70–99)
Potassium: 4 mmol/L (ref 3.5–5.1)
Sodium: 141 mmol/L (ref 135–145)
Total Bilirubin: 0.6 mg/dL (ref 0.3–1.2)
Total Protein: 7.1 g/dL (ref 6.5–8.1)

## 2021-11-15 LAB — CBC
HCT: 39.2 % (ref 36.0–46.0)
Hemoglobin: 13.3 g/dL (ref 12.0–15.0)
MCH: 25.9 pg — ABNORMAL LOW (ref 26.0–34.0)
MCHC: 33.9 g/dL (ref 30.0–36.0)
MCV: 76.4 fL — ABNORMAL LOW (ref 80.0–100.0)
Platelets: 304 10*3/uL (ref 150–400)
RBC: 5.13 MIL/uL — ABNORMAL HIGH (ref 3.87–5.11)
RDW: 13.6 % (ref 11.5–15.5)
WBC: 11.2 10*3/uL — ABNORMAL HIGH (ref 4.0–10.5)
nRBC: 0 % (ref 0.0–0.2)
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
## 2021-11-15 NOTE — ED Provider Notes (Signed)
[place]    CSN: [id number] Arrival date & time: 11/15/21  1001      History   Chief Complaint Chief Complaint  Patient presents with   Poisoning    HPI Bianca Medina is a 71 y.o. female.   Patient presents today with a several day history of increased shortness of breath and decreased exercise tolerance.  She reports that she typically walks [place] every [place] without difficulty but over the past several days she has had difficulty going even a quarter of a mile without becoming significantly winded.  She denies any associated chest pain or palpitations.  She denies any recent illness or additional symptoms including cough/congestion.  She has a history of asthma on her chart but does not use an albuterol inhaler.  Her primary concern is that she has been poisoned with blood.  Reports that she is "able to hear conversations" and things have been telling her that her food including her ice cream, orange soda, watermelon have been poisoned with blood.  She believes that this is the cause of her increasing shortness of breath.  She does have a history of iron deficiency anemia but denies any blood loss.  She does have a history of schizoaffective disorder and is not currently taking any medication for this.    Past Medical History:  Diagnosis Date   Adenomatous colon polyp 2021   Anemia 2003   IRON DEFICIENCY    Asthma    Chronic anal fissure    Common migraine 06/23/2019   Diabetes mellitus without complication (HCC)    GERD (gastroesophageal reflux disease)    Hearing loss    Hyperlipidemia, mild    Hypertension    IDA (iron deficiency anemia)    NSVD (normal spontaneous vaginal delivery)    X3   Obesity    PMDD (premenstrual dysphoric disorder)    Sensorineural hearing loss (SNHL) of both ears    Sleep disorder     Patient Active Problem List   Diagnosis Date Noted   Left leg swelling 11/10/2021   Elevated cholesterol 11/10/2021   Bruising  06/11/2021   Other schizoaffective disorders ([place]) 04/30/2021   Thyroid nodule 04/30/2021   Screening for colon cancer 04/01/2021   Insect bite of right wrist 04/01/2021   Bite, insect 04/01/2021   Rash 03/28/2021   Iron deficiency 03/19/2021   Need for influenza vaccination 03/19/2021   Snoring 01/27/2021   Sleep related bruxism 01/27/2021   Gasping for breath 01/27/2021   Insomnia due to anxiety and fear 01/27/2021   Class 3 severe obesity due to excess calories with serious comorbidity and body mass index (BMI) of 40.0 to 44.9 in adult [facility]) 01/27/2021   Medicare annual wellness visit, subsequent 01/24/2021   Dehydration 12/26/2020   History of syncope 12/26/2020   Hospital discharge follow-up 12/26/2020   Healthcare maintenance 12/01/2020   Vitamin D deficiency 12/01/2020   Ear itching 06/27/2019   Common migraine 06/23/2019   History of anemia 03/31/2019   Pre-diabetes 03/31/2019   Viral syndrome 03/31/2019   Sensorineural hearing loss (SNHL), bilateral 08/12/2017   Tinnitus of both ears 08/12/2017   Morbid obesity ([place]) 12/28/2016   Left breast mass 07/19/2014   Breast tenderness in female 07/19/2014   Menopause 01/09/2014   Vaginal atrophy 01/09/2014   Skin lesion 12/23/2012   Essential hypertension 10/31/2012   Heartburn 10/31/2012    Past Surgical History:  Procedure Laterality Date   ABDOMINAL HYSTERECTOMY     BLADDER  REPAIR     COLONOSCOPY  11/2019   1 hpp and 1 tubl adenoma   GUM SURGERY  1980's   MANDIBLE FRACTURE SURGERY     TUBAL LIGATION      OB History     Gravida  3   Para  3   Term      Preterm      AB      Living  3      SAB      IAB      Ectopic      Multiple      Live Births               Home Medications    Prior to Admission medications   Medication Sig Start Date End Date Taking? Authorizing Provider  aspirin EC 81 MG tablet Take 81 mg by mouth daily. Swallow whole.    [provider]  atorvastatin  (LIPITOR) 20 MG tablet Take 1 tablet (20 mg total) by mouth daily. 11/10/21   Libby Maw, MD  Biotin 5000 MCG CAPS Take 5,000 mcg by mouth daily.     [provider]  Casanthranol-Docusate Sodium (LAXATIVE PLUS STOOL SOFTENER PO) Take by mouth.    [provider]  chlorthalidone (HYGROTON) 25 MG tablet Take 25 mg by mouth daily.    [provider]  esomeprazole (NEXIUM) 40 MG capsule Take 40 mg by mouth daily.     [provider]  fluticasone (FLONASE) 50 MCG/ACT nasal spray Place 2 sprays into both nostrils daily. 05/13/21   Libby Maw, MD  losartan (COZAAR) 25 MG tablet Take 25 mg by mouth daily. 07/23/21   [provider]  magnesium oxide (MAG-OX) 400 (240 Mg) MG tablet Take 400 mg by mouth daily. Patient not taking: Reported on 11/10/2021    [provider]  meloxicam (MOBIC) 15 MG tablet Take 15 mg by mouth daily. 07/03/21   [provider]  metFORMIN (GLUCOPHAGE-XR) 500 MG 24 hr tablet TAKE 1 TABLET BY MOUTH EVERY DAY WITH BREAKFAST 07/14/21   Libby Maw, MD  potassium chloride SA (KLOR-CON M) 20 MEQ tablet Take 20 mEq by mouth once.    [provider]  PROAIR HFA 108 [phone] Base) MCG/ACT inhaler Inhale 1-2 puffs into the lungs every 4 (four) hours as needed for wheezing or shortness of breath.  03/16/18   [provider]    Family History Family History  Problem Relation Age of Onset   Cancer Mother        LUNG- SMOKER   Diabetes Father    Hypertension Father    Heart disease Father    Stroke Father    Cancer Paternal Aunt        COLON   Cancer Paternal Uncle        COLON   Breast cancer Sister     Social History Social History   Tobacco Use   Smoking status: Never   Smokeless tobacco: Never  Vaping Use   Vaping Use: Never used  Substance Use Topics   Alcohol use: Not Currently   Drug use: Never     Allergies   Codeine   Review of Systems Review of  Systems  Constitutional:  Positive for activity change. Negative for appetite change, fatigue and fever.  Respiratory:  Positive for shortness of breath. Negative for cough.   Cardiovascular:  Negative for chest pain, palpitations and leg swelling.  Gastrointestinal:  Negative for  abdominal pain, diarrhea, nausea and vomiting.  Neurological:  Negative for dizziness, light-headedness and headaches.     Physical Exam Triage Vital Signs ED Triage Vitals [11/15/21 1012]  Enc Vitals Group     BP 130/74     Pulse Rate 82     Resp 18     Temp 98.5 F (36.9 C)     Temp Source Oral     SpO2 95 %     Weight      Height      Head Circumference      Peak Flow      Pain Score      Pain Loc      Pain Edu?      Excl. in [place]?    No data found.  Updated Vital Signs BP 130/74 (BP Location: Left Arm)   Pulse 74   Temp 98.5 F (36.9 C) (Oral)   Resp 18   SpO2 95%   Visual Acuity Right Eye Distance:   Left Eye Distance:   Bilateral Distance:    Right Eye Near:   Left Eye Near:    Bilateral Near:     Physical Exam Vitals reviewed.  Constitutional:      General: She is awake. She is not in acute distress.    Appearance: Normal appearance. She is well-developed. She is not ill-appearing.     Comments: Very pleasant female appears stated age in no acute distress sitting in exam room  HENT:     Head: Normocephalic and atraumatic.  Cardiovascular:     Rate and Rhythm: Normal rate and regular rhythm.     Pulses:          Radial pulses are 2+ on the right side and 2+ on the left side.     Heart sounds: Normal heart sounds, S1 normal and S2 normal. No murmur heard.    Comments: Trace pitting edema to mid anterior tibia bilaterally Pulmonary:     Effort: Pulmonary effort is normal.     Breath sounds: Normal breath sounds. No wheezing, rhonchi or rales.     Comments: Clear to auscultation bilaterally Abdominal:     Palpations: Abdomen is soft.     Tenderness: There is no abdominal  tenderness.  Musculoskeletal:     Right lower leg: No edema.     Left lower leg: No edema.  Psychiatric:        Behavior: Behavior is cooperative.      UC Treatments / Results  Labs (all labs ordered are listed, but only abnormal results are displayed) Labs Reviewed  CBC  COMPREHENSIVE METABOLIC PANEL  LEAD, BLOOD (ADULT >= 16 YRS)    EKG   Radiology DG Chest 2 View  Result Date: 11/15/2021 CLINICAL DATA:  Shortness of breath, lead exposure EXAM: CHEST - 2 VIEW COMPARISON:  None FINDINGS: Mild enlargement of cardiac silhouette. Mediastinal contours and pulmonary vascularity normal. Lungs clear. No acute infiltrate, pleural effusion, or pneumothorax. Osseous structures unremarkable. IMPRESSION: No acute abnormalities. Electronically Signed   By: Lavonia Dana M.D.   On: 11/15/2021 10:48    Procedures Procedures (including critical care time)  Medications Ordered in UC Medications - No data to display  Initial Impression / Assessment and Plan / UC Course  I have reviewed the triage vital signs and the nursing notes.  Pertinent labs & imaging results that were available during my care of the patient were reviewed by me and considered in my medical decision making (see  chart for details).     Patient is well-appearing, afebrile, nontoxic, nontachycardic.  EKG was obtained that showed sinus bradycardia with sinus arrhythmia without ischemic changes with ventricular rate of 56 bpm.  Compared to 10/15/2021 tracing sinus bradycardia has replaced normal sinus rhythm without additional significant changes.  Chest x-ray was obtained that was normal.  Discussed that bradycardia can lead to exercise intolerance and shortness of breath but on recheck her heart rate was slightly higher and she was currently asymptomatic.  Discussed that she should follow-up with her cardiologist next week for further evaluation and management.  Suspect that concern for lead poisoning is related to history of  schizoaffective disorder, however, we will add blood testing to basic lab work to investigate reported symptoms of shortness of breath and exercise intolerance.  She was encouraged to rest and drink plenty of fluid.  We will contact her with any abnormalities in her lab work.  She is to follow-up with cardiology first thing next week.  Discussed at length alarm symptoms that warrant emergent evaluation.  Final Clinical Impressions(s) / UC Diagnoses   Final diagnoses:  Shortness of breath  Decreased exercise tolerance     Discharge Instructions      Your chest x-ray was normal.  Your EKG showed that your heart rate is a little bit lower than your typical.  Sometimes this can contribute to your symptoms.  When we recheck this it was higher and so I do not think this is the cause of your symptoms but I do want you to call and schedule an appoint with your cardiologist first thing next week.  If you have any worsening symptoms you need to go to the emergency room immediately.  We will contact you with your lab work if anything is abnormal.     ED Prescriptions   None    PDMP not reviewed this encounter.   Terrilee Croak, PA-C 11/15/21 1136

## 2021-11-15 NOTE — Discharge Instructions (Signed)
Your chest x-ray was normal.  Your EKG showed that your heart rate is a little bit lower than your typical.  Sometimes this can contribute to your symptoms.  When we recheck this it was higher and so I do not think this is the cause of your symptoms but I do want you to call and schedule an appoint with your cardiologist first thing next week.  If you have any worsening symptoms you need to go to the emergency room immediately.  We will contact you with your lab work if anything is abnormal.

## 2021-11-17 DIAGNOSIS — E119 Type 2 diabetes mellitus without complications: Secondary | ICD-10-CM | POA: Diagnosis not present

## 2021-11-17 DIAGNOSIS — R0789 Other chest pain: Secondary | ICD-10-CM | POA: Diagnosis not present

## 2021-11-17 DIAGNOSIS — E785 Hyperlipidemia, unspecified: Secondary | ICD-10-CM | POA: Diagnosis not present

## 2021-11-17 DIAGNOSIS — I10 Essential (primary) hypertension: Secondary | ICD-10-CM | POA: Diagnosis not present

## 2021-11-17 LAB — LEAD, BLOOD (ADULT >= 16 YRS): Lead-Whole Blood: <1 ug/dL (ref 0.0–3.4)

## 2021-11-19 ENCOUNTER — Other Ambulatory Visit: Payer: Self-pay | Admitting: General Surgery

## 2021-11-19 ENCOUNTER — Telehealth: Payer: Self-pay | Admitting: Family Medicine

## 2021-11-19 DIAGNOSIS — D72829 Elevated white blood cell count, unspecified: Secondary | ICD-10-CM

## 2021-11-19 NOTE — Telephone Encounter (Signed)
After being seen at [facility] urgent care pt received her lab results and they said she had low white blood cells and they felt it was due to poisoning. She is requesting for Dr. Ethelene Hal to compare labs from here to the urgent care and advise what to do at this point.

## 2021-11-19 NOTE — Progress Notes (Signed)
Subjective:     Patient ID: Bianca Medina is a 71 y.o. female.   HPI   The following portions of the patient's history were reviewed and updated as appropriate.   This a new patient is here today for: office visit. The patient is here today to discuss having a colonoscopy. She reports bleeding when she wipes for a bowel movement on occasion. Patient reports it is bright red blood only on the tissue. Bowels movements have been typically once to twice per week if she does not take a laxative. The last couple of days she reports more frequency. Patient states her last colonoscopy was done in 2017 with Dr. Nelwyn Salisbury in [place].  The patient reports making use of a 4 liter prep for her last exam and tolerating it well.    The patient reports a 1-2 year history of low back pain when sitting on the commode.  Pain relieved after passage of a large stool.   Rare episodes of bright red blood on the toilet paper. No blood in the water or in the stool.     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   Paternal aunt and uncle with colon cancer.        Chief Complaint  Patient presents with   Pre-op Exam      BP 124/74   Pulse 67   Temp 36.9 C (98.5 F)   Ht 165.1 cm ('5\' 5"'$ )   Wt (!) 121.6 kg (268 lb)   SpO2 95%   BMI 44.60 kg/m        Past Medical History:  Diagnosis Date   Asthma     Colon polyps     Diabetes (CMS-HCC)     GERD (gastroesophageal reflux disease)     Hearing loss     Hyperlipidemia     Hypertension     Iron deficiency anemia 2003   Sleep disorder             Past Surgical History:  Procedure Laterality Date   COLONOSCOPY   2017   ABDOMINAL HYSTERECTOMY       gum surgery        1980's   history of bladder repair       LAPAROSCOPIC TUBAL LIGATION       mandible fracture surgery            OB History       Gravida  3   Para  3   Term      Preterm      AB      Living           SAB      IAB      Ectopic      Molar      Multiple       Live Births           Obstetric Comments  Age at first period 42 Age of first pregnancy 57             Social History          Socioeconomic History   Marital status: Divorced  Tobacco Use   Smoking status: Never   Smokeless tobacco: Never  Substance and Sexual Activity   Alcohol use: Never   Drug use: Never             Allergies  Allergen Reactions   Codeine Other (See Comments)      nightmares  Current Medications        Current Outpatient Medications  Medication Sig Dispense Refill   albuterol 90 mcg/actuation inhaler Inhale 1 inhalation into the lungs every 6 (six) hours as needed       ascorbic acid,vitamin C,,bulk, 100 % Powd Take by mouth once daily       aspirin 81 MG EC tablet Take 81 mg by mouth once daily       atorvastatin (LIPITOR) 10 MG tablet Take 1 tablet by mouth once daily       biotin 5 mg capsule Take 5,000 mcg by mouth once daily       budesonide-formoteroL (SYMBICORT) 80-4.5 mcg/actuation inhaler Inhale 2 inhalations into the lungs 2 (two) times daily       chlorthalidone 25 MG tablet Take 1 tablet by mouth once daily       cholecalciferol (VITAMIN D3) 1000 unit capsule Take 1,000 Units by mouth once daily       esomeprazole (NEXIUM) 40 MG DR capsule Take 40 mg by mouth once daily       fluticasone propionate (FLONASE) 50 mcg/actuation nasal spray Place 2 sprays into both nostrils once daily       loratadine (CLARITIN) 10 mg tablet Take 10 mg by mouth once daily       losartan-hydroCHLOROthiazide (HYZAAR) 100-12.5 mg tablet Take 1 tablet by mouth once daily       meloxicam (MOBIC) 15 MG tablet Take 1 tablet by mouth once daily       metFORMIN (GLUCOPHAGE-XR) 500 MG XR tablet Take 1 tablet by mouth daily with breakfast       potassium chloride (K-TAB) 20 mEq TbER ER tablet Take 20 mEq by mouth once daily        No current facility-administered medications for this visit.             Family History  Problem Relation Age of Onset    Lung cancer Mother     Stroke Father     High blood pressure (Hypertension) Father     Diabetes Father     Heart disease Father     Breast cancer Sister     Colon cancer Paternal Aunt     Colon cancer Paternal Uncle          Labs and Radiology:      December 09, 2015 colonoscopy pathology:  Hyperplastic polyp in the transverse colon,  tubular adenoma/ mucosal neuroma in the sigmoid colon ( 25m)      Laboratory November 15, 2021:    WBC 4.0 - 10.5 K/uL 11.2 High    RBC 3.87 - 5.11 MIL/uL 5.13 High    Hemoglobin 12.0 - 15.0 g/dL 13.3   HCT 36.0 - 46.0 % 39.2   MCV 80.0 - 100.0 fL 76.4 Low    MCH 26.0 - 34.0 pg 25.9 Low    MCHC 30.0 - 36.0 g/dL 33.9   RDW 11.5 - 15.5 % 13.6   Platelets 150 - 400 K/uL 304   nRBC 0.0 - 0.2 % 0.0     Sodium 135 - 145 mmol/L 141   Potassium 3.5 - 5.1 mmol/L 4.0   Chloride 98 - 111 mmol/L 100   CO2 22 - 32 mmol/L 29   Glucose, Bld 70 - 99 mg/dL 125 High    Comment: Glucose reference range applies only to samples taken after fasting for at least 8 hours.  BUN 8 - 23 mg/dL 17   Creatinine, Ser  0.44 - 1.00 mg/dL 0.86   Calcium 8.9 - 10.3 mg/dL 9.4   Total Protein 6.5 - 8.1 g/dL 7.1   Albumin 3.5 - 5.0 g/dL 3.3 Low    AST 15 - 41 U/L 16   ALT 0 - 44 U/L 19   Alkaline Phosphatase 38 - 126 U/L 88   Total Bilirubin 0.3 - 1.2 mg/dL 0.6   GFR, Estimated >60 mL/min >60       November 27, 2021 laboratory:   WBC 4.0 - 10.5 K/uL 9.5   RBC 3.87 - 5.11 Mil/uL 5.03   Platelets 150.0 - 400.0 K/uL 212.0   Hemoglobin 12.0 - 15.0 g/dL 12.8   HCT 36.0 - 46.0 % 38.4   MCV 78.0 - 100.0 fl 76.3 Low    MCHC 30.0 - 36.0 g/dL 33.3   RDW 11.5 - 15.5 % 15.9 High        Stable microcytic indices.   GI evaluation with [facility] of July 16, 2021 reviewed.  Patient presented for sacral pain, similar to that noted above.  Plans were for bowel training and repeat colonoscopy. Exam on hold for BP control as of mid March 2023 note.    Review of Systems   Constitutional:  Negative for chills and fever.  Respiratory:  Negative for cough.        Objective:   Physical Exam Exam conducted with a chaperone present.  Constitutional:      Appearance: Normal appearance.  Cardiovascular:     Rate and Rhythm: Normal rate and regular rhythm.     Pulses: Normal pulses.     Heart sounds: Normal heart sounds.  Pulmonary:     Effort: Pulmonary effort is normal.     Breath sounds: Normal breath sounds.  Musculoskeletal:     Cervical back: Neck supple.  Skin:    General: Skin is warm and dry.  Neurological:     Mental Status: She is alert and oriented to person, place, and time.  Psychiatric:        Mood and Affect: Mood normal.        Behavior: Behavior normal.         Assessment:     Long standing constipation with rare rectal bleeding related to same.   History of colonic polyps.     Stable, mild microcytosis.         Plan:     Colonoscopy' \\should'$   put to rest any concerns for a more serious process. Patient desired to use same 4 liter prep [place] in [id number].        This note is partially prepared by Ledell Noss, CMA acting as a scribe in the presence of Dr. Hervey Ard, MD.    The documentation recorded by the scribe accurately reflects the service I personally performed and the decisions made by me.    Robert Bellow, MD FACS

## 2021-11-21 ENCOUNTER — Telehealth: Payer: Self-pay | Admitting: *Deleted

## 2021-11-21 DIAGNOSIS — N644 Mastodynia: Secondary | ICD-10-CM

## 2021-11-21 NOTE — Telephone Encounter (Signed)
Left message for patient to call.

## 2021-11-21 NOTE — Telephone Encounter (Signed)
Patient called back I explained that right breast biopsy is needed per report on 09/25/21 based on mass found. After reviewing her chart patient was scheduled for diag bilateral mammogram and left breast ultrasound, but appears the left breast ultrasound was not done. Patient reports she is still having problems in her left breast. I asked her to schedule her right breast biopsy and let me check with the breast center about why the left breast ultrasound was not done. I called the breast center and spoke with scheduler and she too did not see left breast ultrasound was done. Her supervisor has left for today (11/21/21) and will return on Monday. I asked for a return call to follow up.

## 2021-11-21 NOTE — Telephone Encounter (Signed)
-----   Message from Leamon Arnt, [place] sent at 11/21/2021  2:57 PM EDT ----- Patient states that the breast imaging order that was place was for the wrong breast. Patient states it should be for the left breast

## 2021-11-23 ENCOUNTER — Encounter: Payer: Self-pay | Admitting: Plastic Surgery

## 2021-11-23 NOTE — Progress Notes (Signed)
Completing the HST was discussed but she became upset when this was discussed as that "was not what she is being seen for". Discussed continued symptoms, she seemed okay during the visit (per A/P) but per check out ladies, she was very upset that she was not seen by the MD (even though she was made aware of this prior to her visit). She was made aware of the risks of untreated apnea but still declined. She was told to call us if she wishes to proceed with testing.

## 2021-11-24 NOTE — Telephone Encounter (Signed)
Please advise message below  °

## 2021-11-24 NOTE — Telephone Encounter (Signed)
Called patient to inform that per Dr. Ethelene Hal there was No evidence for poisoning right now. Have ordered follow up labs for her. No answer unable to Lm will call back

## 2021-11-25 NOTE — Telephone Encounter (Signed)
Dr.Lavoie patient aware she will need to keep both appointments that scheduled on 11/27/21 ( to look at left breast)  and breast biopsy on 12/09/21. Patient verbalized she understood and will keep both appointment.

## 2021-11-25 NOTE — Telephone Encounter (Signed)
Dr.Lavoie patient was seen for office visit on 08/27/21 c/o left breast pain. You order additional imaging for left breast,however left breast ultrasound was not preformed bilateral diag mammogram and right breast ultrasound. The patient reports still having what she reports "like her left breast is leaking inside".  Her diagnostic bilateral mammogram report now states " Single right axillary lymph node with abnormal cortical thickening, suspicious for a metastatic or reactive node."  She is currently scheduled for right breast biopsy on 12/09/21. Patient does have history of left breast pain. Please advise how you wish me to proceed.

## 2021-11-25 NOTE — Telephone Encounter (Signed)
Called patient to inform of message below no answer unable to leave message.

## 2021-11-25 NOTE — Telephone Encounter (Signed)
Patient scheduled on 11/27/21 @ 9:50 am at the breast center. Unable to leave message for patient to call voicemail is full.

## 2021-11-26 DIAGNOSIS — R600 Localized edema: Secondary | ICD-10-CM | POA: Diagnosis not present

## 2021-11-26 DIAGNOSIS — M79662 Pain in left lower leg: Secondary | ICD-10-CM | POA: Diagnosis not present

## 2021-11-27 ENCOUNTER — Ambulatory Visit
Admission: RE | Admit: 2021-11-27 | Discharge: 2021-11-27 | Disposition: A | Discharge status: Home / Self Care | Payer: Medicare PPO | Source: Ambulatory Visit | Attending: Obstetrics & Gynecology | Admitting: Obstetrics & Gynecology

## 2021-11-27 ENCOUNTER — Telehealth: Payer: Self-pay | Admitting: Family Medicine

## 2021-11-27 DIAGNOSIS — N644 Mastodynia: Secondary | ICD-10-CM

## 2021-11-27 NOTE — Telephone Encounter (Signed)
Pt called at 3:20 pm and stated that you called her and she missed your call . Pt said please call her back

## 2021-11-27 NOTE — Telephone Encounter (Signed)
Returned patients call, no answer VM full unable to LM.

## 2021-11-28 NOTE — Telephone Encounter (Signed)
Called patient no answer VM full will call back.

## 2021-12-01 NOTE — Telephone Encounter (Signed)
Patient aware labs showed no evidence of poisoning follow up lab appointment scheduled.

## 2021-12-02 NOTE — Telephone Encounter (Signed)
error 

## 2021-12-03 ENCOUNTER — Ambulatory Visit: Payer: Medicare PPO | Admitting: Certified Registered"

## 2021-12-03 ENCOUNTER — Ambulatory Visit
Admission: RE | Admit: 2021-12-03 | Discharge: 2021-12-03 | Disposition: A | Discharge status: Home / Self Care | Payer: Medicare PPO | Attending: General Surgery | Admitting: General Surgery

## 2021-12-03 ENCOUNTER — Encounter: Payer: Self-pay | Admitting: General Surgery

## 2021-12-03 ENCOUNTER — Other Ambulatory Visit: Payer: Self-pay

## 2021-12-03 ENCOUNTER — Encounter
Admission: RE | Disposition: A | Discharge status: Home / Self Care | Payer: Self-pay | Source: Home / Self Care | Attending: General Surgery

## 2021-12-03 DIAGNOSIS — E119 Type 2 diabetes mellitus without complications: Secondary | ICD-10-CM | POA: Insufficient documentation

## 2021-12-03 DIAGNOSIS — H903 Sensorineural hearing loss, bilateral: Secondary | ICD-10-CM | POA: Diagnosis not present

## 2021-12-03 DIAGNOSIS — K219 Gastro-esophageal reflux disease without esophagitis: Secondary | ICD-10-CM | POA: Diagnosis not present

## 2021-12-03 DIAGNOSIS — Z7984 Long term (current) use of oral hypoglycemic drugs: Secondary | ICD-10-CM | POA: Insufficient documentation

## 2021-12-03 DIAGNOSIS — E785 Hyperlipidemia, unspecified: Secondary | ICD-10-CM | POA: Diagnosis not present

## 2021-12-03 DIAGNOSIS — E669 Obesity, unspecified: Secondary | ICD-10-CM | POA: Insufficient documentation

## 2021-12-03 DIAGNOSIS — Z86010 Personal history of colonic polyps: Secondary | ICD-10-CM | POA: Insufficient documentation

## 2021-12-03 DIAGNOSIS — Z09 Encounter for follow-up examination after completed treatment for conditions other than malignant neoplasm: Secondary | ICD-10-CM | POA: Diagnosis not present

## 2021-12-03 DIAGNOSIS — I10 Essential (primary) hypertension: Secondary | ICD-10-CM | POA: Diagnosis not present

## 2021-12-03 DIAGNOSIS — Z6841 Body Mass Index (BMI) 40.0 and over, adult: Secondary | ICD-10-CM | POA: Insufficient documentation

## 2021-12-03 DIAGNOSIS — I2510 Atherosclerotic heart disease of native coronary artery without angina pectoris: Secondary | ICD-10-CM | POA: Insufficient documentation

## 2021-12-03 DIAGNOSIS — K5730 Diverticulosis of large intestine without perforation or abscess without bleeding: Secondary | ICD-10-CM | POA: Diagnosis not present

## 2021-12-03 DIAGNOSIS — J45909 Unspecified asthma, uncomplicated: Secondary | ICD-10-CM | POA: Diagnosis not present

## 2021-12-03 HISTORY — PX: COLONOSCOPY WITH PROPOFOL: SHX5780

## 2021-12-03 SURGERY — COLONOSCOPY WITH PROPOFOL
Anesthesia: General

## 2021-12-03 MED ORDER — LIDOCAINE HCL (CARDIAC) PF 100 MG/5ML IV SOSY
PREFILLED_SYRINGE | INTRAVENOUS | Status: DC | PRN
  Administered 2021-12-03: 50 mg via INTRAVENOUS

## 2021-12-03 MED ORDER — PROPOFOL 10 MG/ML IV BOLUS
INTRAVENOUS | Status: AC
  Filled 2021-12-03: qty 20

## 2021-12-03 MED ORDER — PROPOFOL 500 MG/50ML IV EMUL
INTRAVENOUS | Status: DC | PRN
  Administered 2021-12-03: 150 ug/kg/min via INTRAVENOUS

## 2021-12-03 MED ORDER — PROPOFOL 10 MG/ML IV BOLUS
INTRAVENOUS | Status: DC | PRN
  Administered 2021-12-03: 50 mg via INTRAVENOUS

## 2021-12-03 MED ORDER — SODIUM CHLORIDE 0.9 % IV SOLN: INTRAVENOUS | Status: DC

## 2021-12-03 NOTE — Anesthesia Procedure Notes (Signed)
Procedure Name: MAC Date/Time: 12/03/2021 10:20 AM  Performed by: Jerrye Noble, CRNAPre-anesthesia Checklist: Patient identified, Emergency Drugs available, Suction available and Patient being monitored Patient Re-evaluated:Patient Re-evaluated prior to induction Oxygen Delivery Method: Nasal cannula

## 2021-12-03 NOTE — Anesthesia Postprocedure Evaluation (Signed)
Anesthesia Post Note  Patient: Bianca Medina  Procedure(s) Performed: COLONOSCOPY WITH PROPOFOL  Patient location during evaluation: Endoscopy Anesthesia Type: General Level of consciousness: awake and alert Pain management: pain level controlled Vital Signs Assessment: post-procedure vital signs reviewed and stable Respiratory status: spontaneous breathing, nonlabored ventilation, respiratory function stable and patient connected to nasal cannula oxygen Cardiovascular status: blood pressure returned to baseline and stable Postop Assessment: no apparent nausea or vomiting Anesthetic complications: no   No notable events documented.   Last Vitals:  Vitals:   12/03/21 1046 12/03/21 1056  BP: 112/64   Pulse:    Resp:    Temp: (!) 36.1 C   SpO2:  100%    Last Pain:  Vitals:   12/03/21 1056  TempSrc:   PainSc: 0-No pain                 Dimas Millin

## 2021-12-03 NOTE — Transfer of Care (Signed)
Immediate Anesthesia Transfer of Care Note  Patient: Bianca Medina  Procedure(s) Performed: COLONOSCOPY WITH PROPOFOL  Patient Location: PACU and Endoscopy Unit  Anesthesia Type:General  Level of Consciousness: drowsy  Airway & Oxygen Therapy: Patient Spontanous Breathing  Post-op Assessment: Report given to RN and Post -op Vital signs reviewed and stable  Post vital signs: Reviewed and stable  Last Vitals:  Vitals Value Taken Time  BP 112/64 12/03/21 1048  Temp 36.1 C 12/03/21 1046  Pulse 52 12/03/21 1049  Resp 19 12/03/21 1049  SpO2 97 % 12/03/21 1049  Vitals shown include unvalidated device data.  Last Pain:  Vitals:   12/03/21 1046  TempSrc: Temporal  PainSc: Asleep         Complications: No notable events documented.

## 2021-12-03 NOTE — Anesthesia Preprocedure Evaluation (Signed)
Anesthesia Evaluation  Patient identified by MRN, date of birth, ID band Patient awake    Reviewed: Allergy & Precautions, NPO status , Patient's Chart, lab work & pertinent test results  History of Anesthesia Complications Negative for: history of anesthetic complications  Airway Mallampati: III  TM Distance: >3 FB Neck ROM: full    Dental  (+) Teeth Intact   Pulmonary asthma ,    Pulmonary exam normal        Cardiovascular hypertension, + CAD  Normal cardiovascular exam     Neuro/Psych PSYCHIATRIC DISORDERS negative neurological ROS     GI/Hepatic negative GI ROS, Neg liver ROS,   Endo/Other  negative endocrine ROSdiabetes  Renal/GU negative Renal ROS  negative genitourinary   Musculoskeletal   Abdominal   Peds  Hematology negative hematology ROS (+)   Anesthesia Other Findings Past Medical History: 2021: Adenomatous colon polyp 2003: Anemia     Comment:  IRON DEFICIENCY  No date: Asthma No date: Chronic anal fissure 06/23/2019: Common migraine No date: Diabetes mellitus without complication (HCC) No date: GERD (gastroesophageal reflux disease) No date: Hearing loss No date: Hyperlipidemia, mild No date: Hypertension No date: IDA (iron deficiency anemia) No date: NSVD (normal spontaneous vaginal delivery)     Comment:  X3 No date: Obesity No date: PMDD (premenstrual dysphoric disorder) No date: Sensorineural hearing loss (SNHL) of both ears No date: Sleep disorder  Past Surgical History: No date: ABDOMINAL HYSTERECTOMY No date: BLADDER REPAIR 11/2019: COLONOSCOPY     Comment:  1 hpp and 1 tubl adenoma 1980's: GUM SURGERY No date: MANDIBLE FRACTURE SURGERY No date: TUBAL LIGATION  BMI    Body Mass Index: 44.93 kg/m      Reproductive/Obstetrics negative OB ROS                             Anesthesia Physical Anesthesia Plan  ASA: 2  Anesthesia Plan: General    Post-op Pain Management: Minimal or no pain anticipated   Induction: Intravenous  PONV Risk Score and Plan: 3 and Propofol infusion, TIVA and Ondansetron  Airway Management Planned: Nasal Cannula  Additional Equipment: None  Intra-op Plan:   Post-operative Plan:   Informed Consent: I have reviewed the patients History and Physical, chart, labs and discussed the procedure including the risks, benefits and alternatives for the proposed anesthesia with the patient or authorized representative who has indicated his/her understanding and acceptance.     Dental advisory given  Plan Discussed with: CRNA and Surgeon  Anesthesia Plan Comments: (Discussed risks of anesthesia with patient, including possibility of difficulty with spontaneous ventilation under anesthesia necessitating airway intervention, PONV, and rare risks such as cardiac or respiratory or neurological events, and allergic reactions. Discussed the role of CRNA in patient's perioperative care. Patient understands.)        Anesthesia Quick Evaluation

## 2021-12-03 NOTE — Op Note (Signed)
[facility] Gastroenterology Patient Name: Bianca Medina Procedure Date: 12/03/2021 10:09 AM MRN: [id number] Account #: [id number] Date of Birth: 1951-02-26 Admit Type: Outpatient Age: 71 Room: [facility] ENDO ROOM 1 Gender: Female Note Status: Finalized Instrument Name: Peds Colonoscope [id number] Procedure:             Colonoscopy Indications:           High risk colon cancer surveillance: Personal history                         of colonic polyps Providers:             Robert Bellow, MD Referring MD:          Libby Maw (Referring MD) Medicines:             Propofol per Anesthesia Complications:         No immediate complications. Procedure:             Pre-Anesthesia Assessment:                        - Prior to the procedure, a History and Physical was                         performed, and patient medications, allergies and                         sensitivities were reviewed. The patient's tolerance                         of previous anesthesia was reviewed.                        - The risks and benefits of the procedure and the                         sedation options and risks were discussed with the                         patient. All questions were answered and informed                         consent was obtained.                        After obtaining informed consent, the colonoscope was                         passed under direct vision. Throughout the procedure,                         the patient's blood pressure, pulse, and oxygen                         saturations were monitored continuously. The                         Colonoscope was introduced through the anus and                         advanced  to the the cecum, identified by appendiceal                         orifice and ileocecal valve. The colonoscopy was                         somewhat difficult due to multiple diverticula in the                         colon. The patient  tolerated the procedure well. The                         quality of the bowel preparation was excellent. Findings:      Multiple medium-mouthed diverticula were found in the recto-sigmoid       colon and sigmoid colon.      The retroflexed view of the distal rectum and anal verge was normal and       showed no anal or rectal abnormalities.      Palpation of the coccyx was unremarkable on rectal exam. Impression:            - Diverticulosis in the recto-sigmoid colon and in the                         sigmoid colon.                        - The distal rectum and anal verge are normal on                         retroflexion view.                        - No specimens collected. Recommendation:        - Repeat colonoscopy in 7 years for surveillance. Procedure Code(s):     --- Professional ---                        [phone], Colonoscopy, flexible; diagnostic, including                         collection of specimen(s) by brushing or washing, when                         performed (separate procedure) Diagnosis Code(s):     --- Professional ---                        Z86.010, Personal history of colonic polyps                        K57.30, Diverticulosis of large intestine without                         perforation or abscess without bleeding CPT copyright 2019 American Medical Association. All rights reserved. The codes documented in this report are preliminary and upon coder review may  be revised to meet current compliance requirements. Robert Bellow, MD 12/03/2021 10:45:17 AM This report has been signed electronically. Number of Addenda: 0 Note Initiated On: 12/03/2021 10:09 AM Scope Withdrawal Time: 0  hours 12 minutes 1 second  Total Procedure Duration: 0 hours 20 minutes 44 seconds  Estimated Blood Loss:  Estimated blood loss: none.      [facility]

## 2021-12-03 NOTE — H&P (Signed)
Bianca Medina [id number] 70-Dec-1952     HPI:  History of intermittent bright red blood per anus with large stools. History of colon polyps in the past.  Tolerated prep well   Medications Prior to Admission  Medication Sig Dispense Refill Last Dose   aspirin EC 81 MG tablet Take 81 mg by mouth daily. Swallow whole.   Past Week   atorvastatin (LIPITOR) 20 MG tablet Take 1 tablet (20 mg total) by mouth daily. 90 tablet 3 12/02/2021   Biotin 5000 MCG CAPS Take 5,000 mcg by mouth daily.    Past Week   Casanthranol-Docusate Sodium (LAXATIVE PLUS STOOL SOFTENER PO) Take by mouth.   Past Week   chlorthalidone (HYGROTON) 25 MG tablet Take 25 mg by mouth daily.   12/02/2021   esomeprazole (NEXIUM) 40 MG capsule Take 40 mg by mouth daily.    12/02/2021   losartan (COZAAR) 25 MG tablet Take 25 mg by mouth daily.   12/02/2021   meloxicam (MOBIC) 15 MG tablet Take 15 mg by mouth daily.   Past Week   metFORMIN (GLUCOPHAGE-XR) 500 MG 24 hr tablet TAKE 1 TABLET BY MOUTH EVERY DAY WITH BREAKFAST 90 tablet 1 12/02/2021   potassium chloride SA (KLOR-CON M) 20 MEQ tablet Take 20 mEq by mouth once.   Past Week   fluticasone (FLONASE) 50 MCG/ACT nasal spray Place 2 sprays into both nostrils daily. 16 g 6    magnesium oxide (MAG-OX) 400 (240 Mg) MG tablet Take 400 mg by mouth daily. (Patient not taking: Reported on 11/10/2021)      PROAIR HFA 108 (90 Base) MCG/ACT inhaler Inhale 1-2 puffs into the lungs every 4 (four) hours as needed for wheezing or shortness of breath.       Allergies  Allergen Reactions   Codeine Other (See Comments)    nightmares Other reaction(s): Other nightmares nightmares   Past Medical History:  Diagnosis Date   Adenomatous colon polyp 2021   Anemia 2003   IRON DEFICIENCY    Asthma    Chronic anal fissure    Common migraine 06/23/2019   Diabetes mellitus without complication (HCC)    GERD (gastroesophageal reflux disease)    Hearing loss    Hyperlipidemia, mild    Hypertension     IDA (iron deficiency anemia)    NSVD (normal spontaneous vaginal delivery)    X3   Obesity    PMDD (premenstrual dysphoric disorder)    Sensorineural hearing loss (SNHL) of both ears    Sleep disorder    Past Surgical History:  Procedure Laterality Date   ABDOMINAL HYSTERECTOMY     BLADDER REPAIR     COLONOSCOPY  11/2019   1 hpp and 1 tubl adenoma   GUM SURGERY  1980's   MANDIBLE FRACTURE SURGERY     TUBAL LIGATION     Social History   Socioeconomic History   Marital status: Single    Spouse name: Not on file   Number of children: 3   Years of education: college   Highest education level: Not on file  Occupational History   Occupation: Architectural technologist: [employer]  Tobacco Use   Smoking status: Never   Smokeless tobacco: Never  Vaping Use   Vaping Use: Never used  Substance and Sexual Activity   Alcohol use: Not Currently   Drug use: Never   Sexual activity: Not Currently    Birth control/protection: Surgical    Comment: HYSTERECTOMY-1st  intercourse 39 yo-5 partners  Other Topics Concern   Not on file  Social History Narrative   Lives alone   Right handed     Caffeine use: soda/tea couple times a week   Social Determinants of Health   Financial Resource Strain: Not on file  Food Insecurity: Not on file  Transportation Needs: Not on file  Physical Activity: Not on file  Stress: Not on file  Social Connections: Not on file  Intimate Partner Violence: Not on file   Social History   Social History Narrative   Lives alone   Right handed     Caffeine use: soda/tea couple times a week     ROS: Negative.     PE: HEENT: Negative. Lungs: Clear. Cardio: RR.   Assessment/Plan:  Proceed with planned endoscopy.  [facility] 70/19/2023

## 2021-12-04 ENCOUNTER — Encounter: Payer: Self-pay | Admitting: General Surgery

## 2021-12-04 ENCOUNTER — Institutional Professional Consult (permissible substitution): Payer: Medicare PPO | Admitting: Plastic Surgery

## 2021-12-04 ENCOUNTER — Ambulatory Visit: Payer: Medicare PPO | Admitting: Family Medicine

## 2021-12-04 ENCOUNTER — Other Ambulatory Visit (INDEPENDENT_AMBULATORY_CARE_PROVIDER_SITE_OTHER): Payer: Medicare PPO

## 2021-12-04 ENCOUNTER — Other Ambulatory Visit: Payer: Self-pay

## 2021-12-04 DIAGNOSIS — D72829 Elevated white blood cell count, unspecified: Secondary | ICD-10-CM

## 2021-12-04 LAB — CBC WITH DIFFERENTIAL/PLATELET
Basophils Absolute: 0.1 10*3/uL (ref 0.0–0.1)
Basophils Relative: 0.6 % (ref 0.0–3.0)
Eosinophils Absolute: 0.1 10*3/uL (ref 0.0–0.7)
Eosinophils Relative: 1.2 % (ref 0.0–5.0)
HCT: 39.2 % (ref 36.0–46.0)
Hemoglobin: 12.8 g/dL (ref 12.0–15.0)
Lymphocytes Relative: 17.6 % (ref 12.0–46.0)
Lymphs Abs: 1.8 10*3/uL (ref 0.7–4.0)
MCHC: 32.7 g/dL (ref 30.0–36.0)
MCV: 77.4 fl — ABNORMAL LOW (ref 78.0–100.0)
Monocytes Absolute: 0.7 10*3/uL (ref 0.1–1.0)
Monocytes Relative: 6.7 % (ref 3.0–12.0)
Neutro Abs: 7.5 10*3/uL (ref 1.4–7.7)
Neutrophils Relative %: 73.9 % (ref 43.0–77.0)
Platelets: 266 10*3/uL (ref 150.0–400.0)
RBC: 5.07 Mil/uL (ref 3.87–5.11)
RDW: 14.3 % (ref 11.5–15.5)
WBC: 10.1 10*3/uL (ref 4.0–10.5)

## 2021-12-04 NOTE — Progress Notes (Signed)
Patient concerned during follow up call, about only voiding 3 times yesterday. Also concerned about not having a bowel movement. Educated about drinking plenty of water today and that she may not have a bowel movement for a couple of days.

## 2021-12-05 LAB — LACTATE DEHYDROGENASE: LDH: 170 U/L (ref 120–250)

## 2021-12-08 DIAGNOSIS — J3089 Other allergic rhinitis: Secondary | ICD-10-CM | POA: Diagnosis not present

## 2021-12-08 DIAGNOSIS — J3081 Allergic rhinitis due to animal (cat) (dog) hair and dander: Secondary | ICD-10-CM | POA: Diagnosis not present

## 2021-12-08 DIAGNOSIS — J301 Allergic rhinitis due to pollen: Secondary | ICD-10-CM | POA: Diagnosis not present

## 2021-12-08 DIAGNOSIS — J4520 Mild intermittent asthma, uncomplicated: Secondary | ICD-10-CM | POA: Diagnosis not present

## 2021-12-09 ENCOUNTER — Other Ambulatory Visit: Payer: Medicare PPO

## 2021-12-09 ENCOUNTER — Inpatient Hospital Stay: Admission: RE | Admit: 2021-12-09 | Payer: Medicare PPO | Source: Ambulatory Visit

## 2021-12-11 ENCOUNTER — Telehealth: Payer: Self-pay | Admitting: Family Medicine

## 2021-12-11 NOTE — Telephone Encounter (Signed)
FYI:Pt wants Dr. Ethelene Hal to know she has started taking Montelukast, Symbicort and Fluticasone.

## 2021-12-16 DIAGNOSIS — E119 Type 2 diabetes mellitus without complications: Secondary | ICD-10-CM | POA: Diagnosis not present

## 2021-12-16 DIAGNOSIS — R002 Palpitations: Secondary | ICD-10-CM | POA: Insufficient documentation

## 2021-12-16 DIAGNOSIS — R0602 Shortness of breath: Secondary | ICD-10-CM | POA: Diagnosis not present

## 2021-12-16 DIAGNOSIS — I499 Cardiac arrhythmia, unspecified: Secondary | ICD-10-CM | POA: Diagnosis not present

## 2021-12-16 DIAGNOSIS — E7800 Pure hypercholesterolemia, unspecified: Secondary | ICD-10-CM | POA: Insufficient documentation

## 2021-12-17 DIAGNOSIS — R140 Abdominal distension (gaseous): Secondary | ICD-10-CM | POA: Diagnosis not present

## 2021-12-17 DIAGNOSIS — R102 Pelvic and perineal pain: Secondary | ICD-10-CM | POA: Diagnosis not present

## 2021-12-22 ENCOUNTER — Ambulatory Visit
Admission: RE | Admit: 2021-12-22 | Discharge: 2021-12-22 | Disposition: A | Discharge status: Home / Self Care | Payer: Medicare PPO | Source: Ambulatory Visit | Attending: Obstetrics & Gynecology | Admitting: Obstetrics & Gynecology

## 2021-12-22 DIAGNOSIS — C50411 Malignant neoplasm of upper-outer quadrant of right female breast: Secondary | ICD-10-CM | POA: Diagnosis not present

## 2021-12-22 DIAGNOSIS — R599 Enlarged lymph nodes, unspecified: Secondary | ICD-10-CM

## 2021-12-22 DIAGNOSIS — N6310 Unspecified lump in the right breast, unspecified quadrant: Secondary | ICD-10-CM

## 2021-12-22 DIAGNOSIS — R590 Localized enlarged lymph nodes: Secondary | ICD-10-CM | POA: Diagnosis not present

## 2021-12-22 DIAGNOSIS — N6311 Unspecified lump in the right breast, upper outer quadrant: Secondary | ICD-10-CM | POA: Diagnosis not present

## 2021-12-24 ENCOUNTER — Encounter: Payer: Self-pay | Admitting: Obstetrics & Gynecology

## 2021-12-24 ENCOUNTER — Other Ambulatory Visit: Payer: Self-pay | Admitting: Family Medicine

## 2021-12-24 ENCOUNTER — Ambulatory Visit: Payer: Medicare PPO | Admitting: Obstetrics & Gynecology

## 2021-12-24 VITALS — BP 120/74 | HR 74 | Temp 99.0°F

## 2021-12-24 DIAGNOSIS — Z170 Estrogen receptor positive status [ER+]: Secondary | ICD-10-CM | POA: Diagnosis not present

## 2021-12-24 DIAGNOSIS — R102 Pelvic and perineal pain: Secondary | ICD-10-CM

## 2021-12-24 DIAGNOSIS — R7303 Prediabetes: Secondary | ICD-10-CM

## 2021-12-24 DIAGNOSIS — Z853 Personal history of malignant neoplasm of breast: Secondary | ICD-10-CM

## 2021-12-24 DIAGNOSIS — R300 Dysuria: Secondary | ICD-10-CM

## 2021-12-24 DIAGNOSIS — C50411 Malignant neoplasm of upper-outer quadrant of right female breast: Secondary | ICD-10-CM | POA: Diagnosis not present

## 2021-12-24 DIAGNOSIS — Z90722 Acquired absence of ovaries, bilateral: Secondary | ICD-10-CM | POA: Diagnosis not present

## 2021-12-24 DIAGNOSIS — B3731 Acute candidiasis of vulva and vagina: Secondary | ICD-10-CM | POA: Diagnosis not present

## 2021-12-24 DIAGNOSIS — Z90710 Acquired absence of both cervix and uterus: Secondary | ICD-10-CM

## 2021-12-24 MED ORDER — SULFAMETHOXAZOLE-TRIMETHOPRIM 800-160 MG PO TABS: 1.0000 | ORAL_TABLET | Freq: Two times a day (BID) | ORAL | 0 refills | Status: AC

## 2021-12-24 MED ORDER — FLUCONAZOLE 150 MG PO TABS: 150.0000 mg | ORAL_TABLET | ORAL | 0 refills | Status: AC

## 2021-12-24 NOTE — Progress Notes (Signed)
    Bianca Medina [birth year] [id number]   History:    71 y.o. G3P3L3  Single.  7 grand-children.   RP:  Pelvic cramping towards the left and vulvar itching    HPI: S/P TAH/BSO in 2004 for Fibroids.  Postmenopause, well on no hormone replacement therapy.  Abstinent.  Patient had a colonoscopy about 2 weeks ago with Dr Collene Mares.  Long standing constipation.  Had a BM yesterday and feels better today.  Some burning when passing urine.  Vulvar itching.  Received a Dx of Rt Breast Cancer today.   Further investigation and management scheduled.  Past medical history,surgical history, family history and social history were all reviewed and documented in the EPIC chart.  Gynecologic History No LMP recorded. Patient has had a hysterectomy.  Obstetric History OB History  Gravida Para Term Preterm AB Living  '3 3       3  '$ SAB IAB Ectopic Multiple Live Births               # Outcome Date GA Lbr Len/2nd Weight Sex Delivery Anes PTL Lv  3 Para           2 Para           1 Para              ROS: A ROS was performed and pertinent positives and negatives are included in the history.  Exam:   BP 120/74   Pulse 74   Temp 99 F (37.2 C) (Oral)   SpO2 95%   General appearance : Well developed well nourished female. No acute distress  Abdomen: no palpable masses or tenderness, no rebound or guarding  Pelvic: Vulva: Normal             Vagina: No gross lesions.  Clumpy discharge present c/w yeasts.  Cervix/Uterus absent  Adnexa  Without masses or tenderness  Anus: Normal  U/A: Yellow cloudy, Pro trace, Nit Neg, WBC 0-5, RBC Neg, Bacteria Moderate.  U. Culture pending.   Assessment/Plan:  71 y.o. female  1. Pelvic cramping Probably associated with recent Colonoscopy/constipation or acute cystitis. S/P TAH/BSO.  No mass felt on Gyn exam. - Urinalysis,Complete w/RFL Culture  2. S/P TAH-BSO  3. Yeast vaginitis Clinical yeast vaginitis.  Will treat with Fluconazole.  Usage reviewed and  prescription sent to pharmacy.  4. Dysuria U/A abnormal.  Will treat with Bactrim DS 1 tab PO BID x 3 days.  Pending urine culture.  5. Right breast cancer Patient received her Dx today.  Further investigation and management scheduled.  Other orders - atorvastatin (LIPITOR) 10 MG tablet; Take 10 mg by mouth daily. - SYMBICORT 80-4.5 MCG/ACT inhaler; Inhale 2 puffs into the lungs 2 (two) times daily. - Vitamin D, Ergocalciferol, (DRISDOL) 1.25 MG (50000 UNIT) CAPS capsule; Take 50,000 Units by mouth once a week. - montelukast (SINGULAIR) 10 MG tablet; Take 10 mg by mouth daily. - Potassium Chloride ER 20 MEQ TBCR; Take 1 tablet by mouth daily. - sulfamethoxazole-trimethoprim (BACTRIM DS) 800-160 MG tablet; Take 1 tablet by mouth 2 (two) times daily for 3 days. - fluconazole (DIFLUCAN) 150 MG tablet; Take 1 tablet (150 mg total) by mouth every other day for 3 doses. - Urine Culture - REFLEXIVE URINE CULTURE   Princess Bruins MD, 3:21 PM 12/24/2021

## 2021-12-26 LAB — URINALYSIS, COMPLETE W/RFL CULTURE
Bilirubin Urine: NEGATIVE
Glucose, UA: NEGATIVE
Hgb urine dipstick: NEGATIVE
Hyaline Cast: NONE SEEN /LPF
Ketones, ur: NEGATIVE
Leukocyte Esterase: NEGATIVE
Nitrites, Initial: NEGATIVE
RBC / HPF: NONE SEEN /HPF (ref 0–2)
Specific Gravity, Urine: 1.023 (ref 1.001–1.035)
pH: 8.5 — ABNORMAL HIGH (ref 5.0–8.0)

## 2021-12-26 LAB — URINE CULTURE
MICRO NUMBER:: 13756297
SPECIMEN QUALITY:: ADEQUATE

## 2021-12-26 LAB — CULTURE INDICATED

## 2021-12-29 ENCOUNTER — Other Ambulatory Visit: Payer: Self-pay | Admitting: Surgery

## 2021-12-29 DIAGNOSIS — C50911 Malignant neoplasm of unspecified site of right female breast: Secondary | ICD-10-CM | POA: Diagnosis not present

## 2021-12-29 DIAGNOSIS — Z853 Personal history of malignant neoplasm of breast: Secondary | ICD-10-CM

## 2021-12-30 ENCOUNTER — Other Ambulatory Visit: Payer: Self-pay | Admitting: Surgery

## 2021-12-30 ENCOUNTER — Telehealth: Payer: Self-pay

## 2021-12-30 DIAGNOSIS — Z853 Personal history of malignant neoplasm of breast: Secondary | ICD-10-CM

## 2021-12-30 NOTE — Telephone Encounter (Signed)
Pt dropped off a envelop for you today. It contained some FYIs (Results from path on 12/22/21 and AVS from visit w/ gen surgeon on 12/29/21-all in EMR) and a request of you.   Pt wrote: I would like a second opinion by a board certified oncologist. I received a call from surgery but I was not available at the time. I prefer a second opinion before I schedule surgery. Dr. Coralie Keens and I met yesterday. "I was not comfortable."  Please advise.

## 2021-12-31 NOTE — Telephone Encounter (Signed)
Bianca Bruins, MD  You 1 hour ago (10:17 AM)    "Invasive" means that it is a cancer, with it's inherent likelihood of metastasizing.  Please refer to an Oncologist to have counseling and opinion on best management.  A surgery is definitely indicated.      Left message for patient to call.

## 2021-12-31 NOTE — Telephone Encounter (Signed)
I did retrieve her original pathology result, that she brought to office for Dr. Marguerita Merles to review, from med recs and made copies for them to scan in. She said she did not mean to leave it. It is in envelope on my desk with her name on it.

## 2021-12-31 NOTE — Telephone Encounter (Signed)
See note below. Patient called back today in voice mail with same request regarding seeing a "board certified oncology surgeon".  In addition to the questions in the initial message she wants to understand what "invasive" means in her diagnosis.

## 2022-01-01 DIAGNOSIS — R0602 Shortness of breath: Secondary | ICD-10-CM | POA: Diagnosis not present

## 2022-01-01 NOTE — Telephone Encounter (Signed)
Left another message to call.

## 2022-01-04 DIAGNOSIS — C50919 Malignant neoplasm of unspecified site of unspecified female breast: Secondary | ICD-10-CM | POA: Insufficient documentation

## 2022-01-05 ENCOUNTER — Encounter (HOSPITAL_BASED_OUTPATIENT_CLINIC_OR_DEPARTMENT_OTHER): Payer: Self-pay | Admitting: Surgery

## 2022-01-05 ENCOUNTER — Encounter: Payer: Self-pay | Admitting: Obstetrics & Gynecology

## 2022-01-05 ENCOUNTER — Ambulatory Visit: Payer: Medicare PPO | Admitting: Obstetrics & Gynecology

## 2022-01-05 ENCOUNTER — Other Ambulatory Visit: Payer: Self-pay

## 2022-01-05 VITALS — BP 120/82

## 2022-01-05 DIAGNOSIS — N644 Mastodynia: Secondary | ICD-10-CM

## 2022-01-05 DIAGNOSIS — Z853 Personal history of malignant neoplasm of breast: Secondary | ICD-10-CM

## 2022-01-05 NOTE — Telephone Encounter (Signed)
On 8.17.23 I spoke with patient as she returned my call. Advised I do have her pathology report at front desk for her to pick up.  I told her what Dr. Marguerita Merles offered in her reply. She has decided to proceed with surgery with Dr.Blackman.  She was hurried to get off the phone because "[place] was on the highway."

## 2022-01-05 NOTE — Progress Notes (Signed)
    Bianca Medina [birth year] [id number]        70 y.o.  G3P3   RP: Left breast tenderness over the weekend  HPI: Left breast tenderness over the weekend.  Patient was sleeping on her left side to avoid pressure on the Rt breast where the cancer is. Tenderness now resolved.  Dx Lt Mammo 11/2021 Neg.  Dx of Rt Breast Invasive Ca, lumpectomy scheduled with Dr Ninfa Linden Monday 01/12/2022.   OB History  Gravida Para Term Preterm AB Living  '3 3       3  '$ SAB IAB Ectopic Multiple Live Births               # Outcome Date GA Lbr Len/2nd Weight Sex Delivery Anes PTL Lv  3 Para           2 Para           1 Para             Past medical history,surgical history, problem list, medications, allergies, family history and social history were all reviewed and documented in the EPIC chart.   Directed ROS with pertinent positives and negatives documented in the history of present illness/assessment and plan.  Exam:  Vitals:   01/05/22 1408  BP: 120/82   General appearance:  Normal  Left breast exam:  Normal.  Lt axilla Neg.   Assessment/Plan:  71 y.o. G3P3   1. Breast pain, left Resolved Lt breast tenderness.  Normal Lt breast exam today.  Dx Mammo Lt Breast Neg 11/2021.  2. Right breast cancer  RT BREAST INVASIVE MAMMARY CARCINOMA WITH PREDOMINANTLY MUCINOUS AND MICROPAPILLARY FEATURES.  Rt Lumpectomy scheduled with Dr Ninfa Linden Monday [id number].  Princess Bruins MD, 2:33 PM 01/05/2022

## 2022-01-07 ENCOUNTER — Telehealth: Payer: Self-pay

## 2022-01-07 DIAGNOSIS — L821 Other seborrheic keratosis: Secondary | ICD-10-CM | POA: Diagnosis not present

## 2022-01-07 DIAGNOSIS — L820 Inflamed seborrheic keratosis: Secondary | ICD-10-CM | POA: Diagnosis not present

## 2022-01-07 NOTE — Telephone Encounter (Signed)
Patient called in voice mail stating she will be getting a second opinion and will not be having surgery for her breast cancer on August 28 with [facility].

## 2022-01-08 ENCOUNTER — Telehealth: Payer: Self-pay | Admitting: Radiation Oncology

## 2022-01-08 ENCOUNTER — Emergency Department (HOSPITAL_COMMUNITY)
Admission: EM | Admit: 2022-01-08 | Discharge: 2022-01-08 | Disposition: A | Discharge status: Home / Self Care | Payer: Medicare PPO | Attending: Emergency Medicine | Admitting: Emergency Medicine

## 2022-01-08 ENCOUNTER — Other Ambulatory Visit: Payer: Self-pay

## 2022-01-08 DIAGNOSIS — Z87898 Personal history of other specified conditions: Secondary | ICD-10-CM

## 2022-01-08 DIAGNOSIS — Z7984 Long term (current) use of oral hypoglycemic drugs: Secondary | ICD-10-CM | POA: Diagnosis not present

## 2022-01-08 DIAGNOSIS — Z7982 Long term (current) use of aspirin: Secondary | ICD-10-CM | POA: Insufficient documentation

## 2022-01-08 DIAGNOSIS — Z853 Personal history of malignant neoplasm of breast: Secondary | ICD-10-CM | POA: Insufficient documentation

## 2022-01-08 DIAGNOSIS — N644 Mastodynia: Secondary | ICD-10-CM | POA: Diagnosis not present

## 2022-01-08 NOTE — ED Triage Notes (Signed)
Pt comes in reporting a "feeling of fluid" moving in her left breast.  Pt has had recent dx of breast CA on the right but states this is not where the feeling is originating. No pain in her chest, no shob, n/v/d.

## 2022-01-08 NOTE — Telephone Encounter (Signed)
8/24 @ 11:07 am Left voicemail for patient to call our office.

## 2022-01-08 NOTE — Discharge Instructions (Addendum)
You were seen today for left breast problem.  Your exam today is reassuring.  Please follow-up with your primary care doctor in the next 2 to 3 days regarding today's visit and your symptoms. It is important that you follow-up with your gynecologist regarding your recent diagnosis of breast cancer. Please return to the emergency department if you experience any chest pain, difficulty breathing, abdominal pain, altered mental status, severe fever, or any other concerning symptom.  Thank you for allowing [place] to participate in your care.

## 2022-01-08 NOTE — Telephone Encounter (Signed)
Bianca Bruins, MD  You 12 minutes ago (2:24 PM)    Thank you for the information.

## 2022-01-08 NOTE — ED Provider Notes (Signed)
[facility] EMERGENCY DEPARTMENT Provider Note   CSN: [id number] Arrival date & time: 01/08/22  7510     History  Chief Complaint  Patient presents with   Breast Problem    Bianca Medina is a 71 y.o. female PMH recently diagnosed right breast cancer who is presenting with left breast problem.  She reports that at night, she feels a "feeling of fluid dripping" on her left breast.  This has been ongoing on and off since April.  She does not notice it when she is laying flat, it is only when she has rolled onto her left side.  She denies any fevers.  Denies drainage from nipple.  Denies any cough, congestion, sore throat, difficulty breathing, chest pain.  Home Medications Prior to Admission medications   Medication Sig Start Date End Date Taking? Authorizing Provider  aspirin EC 81 MG tablet Take 81 mg by mouth daily. Swallow whole.    [provider]  atorvastatin (LIPITOR) 10 MG tablet Take 10 mg by mouth daily. 12/05/21   [provider]  Biotin 5000 MCG CAPS Take 5,000 mcg by mouth daily.     [provider]  Casanthranol-Docusate Sodium (LAXATIVE PLUS STOOL SOFTENER PO) Take by mouth.    [provider]  chlorthalidone (HYGROTON) 25 MG tablet Take 25 mg by mouth daily.    [provider]  esomeprazole (NEXIUM) 40 MG capsule Take 40 mg by mouth daily.     [provider]  fluticasone (FLONASE) 50 MCG/ACT nasal spray Place 2 sprays into both nostrils daily. 05/13/21   Libby Maw, MD  losartan (COZAAR) 25 MG tablet Take 25 mg by mouth daily. 07/23/21   [provider]  meloxicam (MOBIC) 15 MG tablet Take 15 mg by mouth daily. 07/03/21   [provider]  metFORMIN (GLUCOPHAGE-XR) 500 MG 24 hr tablet TAKE 1 TABLET BY MOUTH EVERY DAY WITH BREAKFAST 12/24/21   Libby Maw, MD  montelukast (SINGULAIR) 10 MG tablet Take 10 mg by mouth daily. 12/08/21   [provider]   Potassium Chloride ER 20 MEQ TBCR Take 1 tablet by mouth daily. 11/10/21   [provider]  PROAIR HFA 108 (90 Base) MCG/ACT inhaler Inhale 1-2 puffs into the lungs every 4 (four) hours as needed for wheezing or shortness of breath.  03/16/18   [provider]  SYMBICORT 80-4.5 MCG/ACT inhaler Inhale 2 puffs into the lungs 2 (two) times daily. 12/08/21   [provider]  Vitamin D, Ergocalciferol, (DRISDOL) 1.25 MG (50000 UNIT) CAPS capsule Take 50,000 Units by mouth once a week. 11/29/21   [provider]  vitamin E 1000 UNIT capsule Take 1,000 Units by mouth daily.    [provider]      Allergies    Codeine    Review of Systems   Review of Systems As in HPI.  Physical Exam Updated Vital Signs BP (!) 140/72 (BP Location: Right Arm)   Pulse 80   Temp 98.5 F (36.9 C) (Oral)   Resp 16   SpO2 93%  Physical Exam Constitutional:      Appearance: Normal appearance. She is obese. She is not ill-appearing or diaphoretic.  HENT:     Right Ear: External ear normal.     Left Ear: External ear normal.     Nose: Nose normal.  Cardiovascular:     Rate and Rhythm: Normal rate.     Heart sounds: Murmur heard.  Pulmonary:  Effort: Pulmonary effort is normal. No respiratory distress.     Breath sounds: Normal breath sounds. No wheezing or rales.  Musculoskeletal:     Comments: No obvious wounds, areas of drainage, areas of erythema on either breast.  No drainage from either nipple.  No chest wall tenderness to palpation.  Neurological:     Mental Status: She is alert.     ED Results / Procedures / Treatments   Labs (all labs ordered are listed, but only abnormal results are displayed) Labs Reviewed - No data to display  EKG None  Radiology No results found.  Procedures Procedures   Medications Ordered in ED Medications - No data to display  ED Course/ Medical Decision Making/ A&P                           Medical Decision  Making  Bianca Medina is a 71 y.o. female with significant PMHx recently diagnosed invasive mammary carcinoma of the right breast who presented to the ED with left breast problem.  Vitals at presentation within normal limits.  Patient is hemodynamically stable, afebrile, satting well on room air.  Physical exam reassuring, no obvious wounds or areas of drainage on either breast.  No erythema or tenderness to palpation of breasts.  Normal heart sounds.  Lungs clear to auscultation bilaterally.  Abdomen is soft, nontender to palpation.  No pitting edema.  Initial ddx includes: breast mass, draining tumor/mass, mastitis, breast cyst  Patient reports this has been ongoing for several months.  Per outpatient gynecology notes, patient with left breast tenderness that resolved as of 01/05/22.  Diagnostic mammograms obtained of bilateral breast in July.  Left breast was negative.  Right breast demonstrated invasive mammary carcinoma.  Patient was scheduled for lumpectomy this upcoming Monday, however declined and is in the process of seeking a second opinion.  I encouraged the patient to find a surgical team that she is comfortable with, however stressed the importance of urgency in this matter and undergoing lumpectomy to remove the newly diagnosed cancer prior to it spreading.  Patient verbalized understanding.  The patient is safe and stable for discharge at this time with return precautions provided and a plan for follow-up care in place as needed with her PCP and gynecology.  The plan for this patient was discussed with Dr. Jeanell Medina, who voiced agreement and who oversaw evaluation and treatment of this patient.    Final Clinical Impression(s) / ED Diagnoses Final diagnoses:  History of breast problem    Rx / DC Orders ED Discharge Orders     None         Bianca Million, MD 01/08/22 6734    Bianca Boss, MD 01/09/22 [phone]

## 2022-01-08 NOTE — ED Provider Notes (Signed)
71 year old female with recently diagnosed breast cancer presents today stating that she feels like she is having fluid coming from her breasts bilaterally.  Varies depending on what side she is laying on.  Is noted some fluid on her back and on her legs.  She is not having any increased pain, redness, or swelling.  She has been scheduled to have lumpectomy and cancer treatment but has delayed this that she is seeking a second opinion. Patient states it feels like the symptoms are on the inside.  She denies cough, fever, shortness of breath Breasts examined with no obvious lumps, fluctuance, redness, or discharge noted. Chart reviewed with mammogram noted. Reviewed surgeons notes Differential diagnosis includes but is not limited to excreting tumor, infection, diseases of the lung, congestive heart failure. Patient with normal exam here Lungs are clear to auscultation Vital signs are normal.  As noted in nursing notes a sat of 93% was noted, however I personally reviewed and checked her vital signs and heart rate was 82 with oxygen saturations 97% Doubt any acute process. Patient appears stable for discharge. Patient was advised regarding following up for her breast cancer and establishing her care as soon as possible   Pattricia Boss, MD 01/08/22 [phone]

## 2022-01-09 ENCOUNTER — Telehealth: Payer: Self-pay | Admitting: Radiation Oncology

## 2022-01-09 DIAGNOSIS — C50911 Malignant neoplasm of unspecified site of right female breast: Secondary | ICD-10-CM | POA: Diagnosis not present

## 2022-01-09 DIAGNOSIS — Z170 Estrogen receptor positive status [ER+]: Secondary | ICD-10-CM | POA: Insufficient documentation

## 2022-01-09 NOTE — Telephone Encounter (Signed)
8/25 @ 8:25 am returned call; patient left voicemail.  Patient voicemail is now full.  Left message with pt's son to call our office.

## 2022-01-12 ENCOUNTER — Telehealth: Payer: Self-pay | Admitting: Radiation Oncology

## 2022-01-12 ENCOUNTER — Ambulatory Visit (HOSPITAL_BASED_OUTPATIENT_CLINIC_OR_DEPARTMENT_OTHER): Admission: RE | Admit: 2022-01-12 | Payer: Medicare PPO | Source: Home / Self Care | Admitting: Surgery

## 2022-01-12 DIAGNOSIS — E119 Type 2 diabetes mellitus without complications: Secondary | ICD-10-CM

## 2022-01-12 SURGERY — BREAST LUMPECTOMY WITH RADIOACTIVE SEED AND SENTINEL LYMPH NODE BIOPSY
Anesthesia: General | Site: Breast | Laterality: Right

## 2022-01-12 NOTE — Telephone Encounter (Signed)
8/28 @ 3:54 pm still no answer.  Was able to Left voicemail this time.  At this point after several attempts to reach patient. Sending unable to contact letter to patient.

## 2022-01-12 NOTE — Telephone Encounter (Signed)
8/28 @ 8:18 am Called patient's contact #.  Mailbox is  full/cannot leave voicemail.  Will try again later.

## 2022-01-14 DIAGNOSIS — R609 Edema, unspecified: Secondary | ICD-10-CM | POA: Diagnosis not present

## 2022-01-14 DIAGNOSIS — R140 Abdominal distension (gaseous): Secondary | ICD-10-CM | POA: Diagnosis not present

## 2022-01-14 DIAGNOSIS — L299 Pruritus, unspecified: Secondary | ICD-10-CM | POA: Diagnosis not present

## 2022-01-14 DIAGNOSIS — H9313 Tinnitus, bilateral: Secondary | ICD-10-CM | POA: Diagnosis not present

## 2022-01-14 DIAGNOSIS — K5900 Constipation, unspecified: Secondary | ICD-10-CM | POA: Diagnosis not present

## 2022-01-20 DIAGNOSIS — C50411 Malignant neoplasm of upper-outer quadrant of right female breast: Secondary | ICD-10-CM | POA: Diagnosis not present

## 2022-01-21 DIAGNOSIS — R002 Palpitations: Secondary | ICD-10-CM | POA: Diagnosis not present

## 2022-01-22 DIAGNOSIS — K625 Hemorrhage of anus and rectum: Secondary | ICD-10-CM | POA: Diagnosis not present

## 2022-01-23 ENCOUNTER — Telehealth: Payer: Self-pay | Admitting: Family Medicine

## 2022-01-23 NOTE — Telephone Encounter (Signed)
Returned patient call x 2 no answer unable to LM will call back.

## 2022-01-23 NOTE — Telephone Encounter (Signed)
Pt called at 11:08 am staed she would like you to give a call asap. Pt stated that she is leaving the E.D. now
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
## 2022-01-26 ENCOUNTER — Telehealth (INDEPENDENT_AMBULATORY_CARE_PROVIDER_SITE_OTHER): Payer: Medicare PPO | Admitting: Family Medicine

## 2022-01-26 ENCOUNTER — Encounter: Payer: Self-pay | Admitting: Family Medicine

## 2022-01-26 VITALS — Ht 65.0 in | Wt 267.0 lb

## 2022-01-26 DIAGNOSIS — K5901 Slow transit constipation: Secondary | ICD-10-CM

## 2022-01-26 DIAGNOSIS — R7303 Prediabetes: Secondary | ICD-10-CM

## 2022-01-26 NOTE — Progress Notes (Signed)
Established Patient Office Visit  Subjective   Patient ID: Bianca Medina, female    DOB: [birth year]  Age: 71 y.o. MRN: [id number]  Chief Complaint  Patient presents with   Advice Only    Patient would like to discuss having implant removed recommendation of where to go. Causing lots of bloating have noticed blood in stool last week very constipated.     HPI   for follow-up of constipation and prediabetes.  Recent hemoglobin A1c was 7.  She continues with metformin.  Ongoing history of constipation with GI directed treatment.  Seen recently by GI for colonoscopy.  Diagnosed with internal hemorrhoids.  Advised to use MiraLAX.  It is helpful.  She has had some blood clots.     Review of Systems  Constitutional: Negative.   HENT: Negative.    Eyes:  Negative for blurred vision, discharge and redness.  Respiratory: Negative.    Cardiovascular: Negative.   Gastrointestinal:  Positive for blood in stool. Negative for abdominal pain and melena.  Genitourinary: Negative.   Musculoskeletal: Negative.  Negative for myalgias.  Skin:  Negative for rash.  Neurological:  Negative for tingling, loss of consciousness and weakness.  Endo/Heme/Allergies:  Negative for polydipsia.      Objective:     Ht '5\' 5"'$  (1.651 m)   Wt 267 lb (121.1 kg)   BMI 44.43 kg/m    Physical Exam Constitutional:      General: She is not in acute distress.    Appearance: She is not ill-appearing, toxic-appearing or diaphoretic.  Pulmonary:     Effort: Pulmonary effort is normal. No respiratory distress.  Abdominal:     General: Bowel sounds are normal.  Neurological:     Mental Status: She is alert and oriented to person, place, and time.  Psychiatric:        Mood and Affect: Mood normal.        Behavior: Behavior normal.      No results found for any visits on 01/26/22.    The 10-year ASCVD risk score (Arnett DK, et al., 2019) is: 20.5%    Assessment & Plan:   Problem List Items Addressed  This Visit       Digestive   Slow transit constipation     Other   Pre-diabetes - Primary    No follow-ups on file.  Continue follow-up with GI for constipation and blood clots.  Continue MiraLAX as needed as recommended.  Continue metformin as directed.  Libby Maw, MD  Virtual Visit via Telephone Note  I connected with Bianca Medina on 01/26/22 at  1:40 PM EDT by telephone and verified that I am speaking with the correct person using two identifiers.  Location: Patient: home alone Provider: work   I discussed the limitations, risks, security and privacy concerns of performing an evaluation and management service by telephone and the availability of in person appointments. I also discussed with the patient that there may be a patient responsible charge related to this service. The patient expressed understanding and agreed to proceed.   History of Present Illness:    Observations/Objective:   Assessment and Plan:   Follow Up Instructions:    I discussed the assessment and treatment plan with the patient. The patient was provided an opportunity to ask questions and all were answered. The patient agreed with the plan and demonstrated an understanding of the instructions.   The patient was advised to call back or seek an in-person evaluation  if the symptoms worsen or if the condition fails to improve as anticipated.  I provided 20 minutes of non-face-to-face time during this encounter.   Libby Maw, MD    Interactive video and audio telecommunications were attempted between myself and the patient. However they failed due to the patient having technical difficulties or not having access to video capability. We continued and completed with audio only.

## 2022-02-09 ENCOUNTER — Telehealth: Payer: Self-pay | Admitting: Family Medicine

## 2022-02-09 NOTE — Telephone Encounter (Signed)
Pt stated that she would like you to call her in reference to getting some lab work

## 2022-02-10 ENCOUNTER — Ambulatory Visit: Payer: Medicare PPO | Admitting: Family Medicine

## 2022-02-10 DIAGNOSIS — I519 Heart disease, unspecified: Secondary | ICD-10-CM | POA: Diagnosis not present

## 2022-02-10 DIAGNOSIS — E119 Type 2 diabetes mellitus without complications: Secondary | ICD-10-CM | POA: Diagnosis not present

## 2022-02-10 DIAGNOSIS — I10 Essential (primary) hypertension: Secondary | ICD-10-CM | POA: Diagnosis not present

## 2022-02-10 DIAGNOSIS — E785 Hyperlipidemia, unspecified: Secondary | ICD-10-CM | POA: Diagnosis not present

## 2022-02-11 DIAGNOSIS — M1711 Unilateral primary osteoarthritis, right knee: Secondary | ICD-10-CM | POA: Diagnosis not present

## 2022-02-11 DIAGNOSIS — M6281 Muscle weakness (generalized): Secondary | ICD-10-CM | POA: Diagnosis not present

## 2022-02-19 ENCOUNTER — Telehealth: Payer: Self-pay | Admitting: Family Medicine

## 2022-02-19 NOTE — Telephone Encounter (Signed)
Left message for patient to call back and schedule Medicare Annual Wellness Visit (AWV).   Please offer to do virtually or by telephone.  Left office number and my jabber #[phone].  Last AWV:01/24/2021   Please schedule at anytime with Nurse Health Advisor.  

## 2022-02-23 NOTE — Telephone Encounter (Signed)
Returned patients call no answer unable to LM will call patient back.

## 2022-02-24 ENCOUNTER — Other Ambulatory Visit: Payer: Self-pay | Admitting: Family Medicine

## 2022-02-24 DIAGNOSIS — I10 Essential (primary) hypertension: Secondary | ICD-10-CM

## 2022-02-26 DIAGNOSIS — M6281 Muscle weakness (generalized): Secondary | ICD-10-CM | POA: Diagnosis not present

## 2022-02-26 DIAGNOSIS — M1711 Unilateral primary osteoarthritis, right knee: Secondary | ICD-10-CM | POA: Diagnosis not present

## 2022-03-05 ENCOUNTER — Other Ambulatory Visit: Payer: Self-pay | Admitting: Family Medicine

## 2022-03-05 DIAGNOSIS — E611 Iron deficiency: Secondary | ICD-10-CM

## 2022-03-09 DIAGNOSIS — M1711 Unilateral primary osteoarthritis, right knee: Secondary | ICD-10-CM | POA: Diagnosis not present

## 2022-03-18 ENCOUNTER — Encounter (HOSPITAL_COMMUNITY): Payer: Self-pay | Admitting: Emergency Medicine

## 2022-03-18 ENCOUNTER — Telehealth: Payer: Self-pay

## 2022-03-18 ENCOUNTER — Emergency Department (HOSPITAL_COMMUNITY)
Admission: EM | Admit: 2022-03-18 | Discharge: 2022-03-18 | Disposition: A | Discharge status: Home / Self Care | Payer: Medicare PPO | Attending: Emergency Medicine | Admitting: Emergency Medicine

## 2022-03-18 ENCOUNTER — Other Ambulatory Visit: Payer: Self-pay

## 2022-03-18 DIAGNOSIS — I10 Essential (primary) hypertension: Secondary | ICD-10-CM | POA: Insufficient documentation

## 2022-03-18 DIAGNOSIS — E119 Type 2 diabetes mellitus without complications: Secondary | ICD-10-CM | POA: Diagnosis not present

## 2022-03-18 DIAGNOSIS — M542 Cervicalgia: Secondary | ICD-10-CM | POA: Diagnosis present

## 2022-03-18 DIAGNOSIS — Z79899 Other long term (current) drug therapy: Secondary | ICD-10-CM | POA: Diagnosis not present

## 2022-03-18 DIAGNOSIS — Z7982 Long term (current) use of aspirin: Secondary | ICD-10-CM | POA: Diagnosis not present

## 2022-03-18 DIAGNOSIS — D72829 Elevated white blood cell count, unspecified: Secondary | ICD-10-CM | POA: Insufficient documentation

## 2022-03-18 DIAGNOSIS — X58XXXA Exposure to other specified factors, initial encounter: Secondary | ICD-10-CM | POA: Insufficient documentation

## 2022-03-18 DIAGNOSIS — S161XXA Strain of muscle, fascia and tendon at neck level, initial encounter: Secondary | ICD-10-CM | POA: Insufficient documentation

## 2022-03-18 DIAGNOSIS — Z853 Personal history of malignant neoplasm of breast: Secondary | ICD-10-CM | POA: Insufficient documentation

## 2022-03-18 DIAGNOSIS — Z7984 Long term (current) use of oral hypoglycemic drugs: Secondary | ICD-10-CM | POA: Insufficient documentation

## 2022-03-18 LAB — COMPREHENSIVE METABOLIC PANEL
ALT: 18 U/L (ref 0–44)
AST: 16 U/L (ref 15–41)
Albumin: 3.3 g/dL — ABNORMAL LOW (ref 3.5–5.0)
Alkaline Phosphatase: 99 U/L (ref 38–126)
Anion gap: 9 (ref 5–15)
BUN: 17 mg/dL (ref 8–23)
CO2: 29 mmol/L (ref 22–32)
Calcium: 9.2 mg/dL (ref 8.9–10.3)
Chloride: 102 mmol/L (ref 98–111)
Creatinine, Ser: 0.83 mg/dL (ref 0.44–1.00)
GFR, Estimated: >60 mL/min (ref >60)
Glucose, Bld: 118 mg/dL — ABNORMAL HIGH (ref 70–99)
Potassium: 4.1 mmol/L (ref 3.5–5.1)
Sodium: 140 mmol/L (ref 135–145)
Total Bilirubin: 0.3 mg/dL (ref 0.3–1.2)
Total Protein: 7 g/dL (ref 6.5–8.1)

## 2022-03-18 LAB — CBC WITH DIFFERENTIAL/PLATELET
Abs Immature Granulocytes: 0.12 10*3/uL — ABNORMAL HIGH (ref 0.00–0.07)
Basophils Absolute: 0.1 10*3/uL (ref 0.0–0.1)
Basophils Relative: 1 %
Eosinophils Absolute: 0.2 10*3/uL (ref 0.0–0.5)
Eosinophils Relative: 2 %
HCT: 36.8 % (ref 36.0–46.0)
Hemoglobin: 12.3 g/dL (ref 12.0–15.0)
Immature Granulocytes: 1 %
Lymphocytes Relative: 19 %
Lymphs Abs: 2.1 10*3/uL (ref 0.7–4.0)
MCH: 24.9 pg — ABNORMAL LOW (ref 26.0–34.0)
MCHC: 33.4 g/dL (ref 30.0–36.0)
MCV: 74.6 fL — ABNORMAL LOW (ref 80.0–100.0)
Monocytes Absolute: 0.8 10*3/uL (ref 0.1–1.0)
Monocytes Relative: 7 %
Neutro Abs: 7.8 10*3/uL — ABNORMAL HIGH (ref 1.7–7.7)
Neutrophils Relative %: 70 %
Platelets: 273 10*3/uL (ref 150–400)
RBC: 4.93 MIL/uL (ref 3.87–5.11)
RDW: 14.3 % (ref 11.5–15.5)
WBC: 11.1 10*3/uL — ABNORMAL HIGH (ref 4.0–10.5)
nRBC: 0 % (ref 0.0–0.2)

## 2022-03-18 MED ORDER — IBUPROFEN 600 MG PO TABS: 600.0000 mg | ORAL_TABLET | Freq: Four times a day (QID) | ORAL | 0 refills | Status: DC | PRN

## 2022-03-18 MED ORDER — IBUPROFEN 400 MG PO TABS
600.0000 mg | ORAL_TABLET | Freq: Once | ORAL | Status: AC
  Administered 2022-03-18: 600 mg via ORAL
  Filled 2022-03-18: qty 1

## 2022-03-18 NOTE — ED Provider Notes (Signed)
[facility] EMERGENCY DEPARTMENT Provider Note   CSN: [id number] Arrival date & time: 03/18/22  1006     History  Chief Complaint  Patient presents with   Neck Pain    Bianca Medina is a 71 y.o. female.   Neck Pain   71 year old female presents emergency department with complaints of left-sided neck pain.  Patient states that symptoms been present for approximately past week to week and a half.  She noted initial stinging station at night when her neck was rotated to the right..  She reports intermittent stinging sensation that is relieved by topical alcohol as well as warm compresses.  She is taken no medication for this.  She states that she has been able to triangulate between a telephone pole in her words, cell phone tower and an object in her house and is able to hear people's voices.  She states that the people's voices have been telling her that someone has been putting bugs on her when she sleeps that are possibly flesh eating.  She is concerned given stinging sensation and reported swelling over area due to these flesh eating bugs.  Denies fever, weakness/sensory deficits in upper extremities, known trauma/falls.  Denies history of drug use, alcohol use.  Past medical history significant for breast cancer, sleep disorder, obesity, hypertension, hyperlipidemia, GERD, diabetes mellitus, schizoaffective disorder  Home Medications Prior to Admission medications   Medication Sig Start Date End Date Taking? Authorizing Provider  ibuprofen (ADVIL) 600 MG tablet Take 1 tablet (600 mg total) by mouth every 6 (six) hours as needed. 03/18/22  Yes Dion Saucier A, PA  aspirin EC 81 MG tablet Take 81 mg by mouth daily. Swallow whole.    [provider]  atorvastatin (LIPITOR) 10 MG tablet Take 10 mg by mouth daily. 12/05/21   [provider]  Biotin 5000 MCG CAPS Take 5,000 mcg by mouth daily.     [provider]  Casanthranol-Docusate Sodium  (LAXATIVE PLUS STOOL SOFTENER PO) Take by mouth.    [provider]  chlorthalidone (HYGROTON) 25 MG tablet TAKE 1 TABLET (25 MG TOTAL) BY MOUTH DAILY. 02/24/22   Libby Maw, MD  esomeprazole (NEXIUM) 40 MG capsule Take 40 mg by mouth daily.     [provider]  fluticasone (FLONASE) 50 MCG/ACT nasal spray Place 2 sprays into both nostrils daily. 05/13/21   Libby Maw, MD  losartan (COZAAR) 25 MG tablet Take 25 mg by mouth daily. 07/23/21   [provider]  meloxicam (MOBIC) 15 MG tablet Take 15 mg by mouth daily. 07/03/21   [provider]  metFORMIN (GLUCOPHAGE-XR) 500 MG 24 hr tablet TAKE 1 TABLET BY MOUTH EVERY DAY WITH BREAKFAST 12/24/21   Libby Maw, MD  montelukast (SINGULAIR) 10 MG tablet Take 10 mg by mouth daily. 12/08/21   [provider]  Potassium Chloride ER 20 MEQ TBCR Take 1 tablet by mouth daily. 11/10/21   [provider]  PROAIR HFA 108 (90 Base) MCG/ACT inhaler Inhale 1-2 puffs into the lungs every 4 (four) hours as needed for wheezing or shortness of breath.  03/16/18   [provider]  SYMBICORT 80-4.5 MCG/ACT inhaler Inhale 2 puffs into the lungs 2 (two) times daily. 12/08/21   [provider]  Vitamin D, Ergocalciferol, (DRISDOL) 1.25 MG (50000 UNIT) CAPS capsule Take 50,000 Units by mouth once a week. 11/29/21   [provider]  vitamin E 1000 UNIT capsule Take 1,000 Units  by mouth daily.    [provider]      Allergies    Codeine    Review of Systems   Review of Systems  Musculoskeletal:  Positive for neck pain.  All other systems reviewed and are negative.   Physical Exam Updated Vital Signs BP (!) 139/58 (BP Location: Right Arm)   Pulse (!) 52   Temp 98.4 F (36.9 C) (Oral)   Resp 16   SpO2 96%  Physical Exam Vitals and nursing note reviewed.  Constitutional:      General: She is not in acute distress.    Appearance: She is  well-developed.  HENT:     Head: Normocephalic and atraumatic.  Eyes:     Conjunctiva/sclera: Conjunctivae normal.  Neck:     Comments: Left paraspinal tenderness in the cervical region. Cardiovascular:     Rate and Rhythm: Normal rate and regular rhythm.     Heart sounds: No murmur heard. Pulmonary:     Effort: Pulmonary effort is normal. No respiratory distress.     Breath sounds: Normal breath sounds.  Abdominal:     Palpations: Abdomen is soft.     Tenderness: There is no abdominal tenderness.  Musculoskeletal:        General: No swelling.     Cervical back: Normal range of motion and neck supple. No rigidity.     Right lower leg: No edema.     Left lower leg: No edema.     Comments: Patient is full range of motion of bilateral shoulders.  Radial pulses full intact bilaterally.  Muscle strength 5/5 for upper extremities.  Sensation intact along major nerve distributions of upper extremities.  Skin:    General: Skin is warm and dry.     Capillary Refill: Capillary refill takes less than 2 seconds.  Neurological:     Mental Status: She is alert.  Psychiatric:        Mood and Affect: Mood normal.     ED Results / Procedures / Treatments   Labs (all labs ordered are listed, but only abnormal results are displayed) Labs Reviewed  COMPREHENSIVE METABOLIC PANEL - Abnormal; Notable for the following components:      Result Value   Glucose, Bld 118 (*)    Albumin 3.3 (*)    All other components within normal limits  CBC WITH DIFFERENTIAL/PLATELET - Abnormal; Notable for the following components:   WBC 11.1 (*)    MCV 74.6 (*)    MCH 24.9 (*)    Neutro Abs 7.8 (*)    Abs Immature Granulocytes 0.12 (*)    All other components within normal limits    EKG None  Radiology No results found.  Procedures Procedures    Medications Ordered in ED Medications  ibuprofen (ADVIL) tablet 600 mg (600 mg Oral Given 03/18/22 1209)    ED Course/ Medical Decision Making/ A&P                            Medical Decision Making Amount and/or Complexity of Data Reviewed Labs: ordered.   This patient presents to the ED for concern of neck pain, this involves an extensive number of treatment options, and is a complaint that carries with it a high risk of complications and morbidity.  The differential diagnosis includes carotid artery dissection, muscular strain, fracture, strain sprain, dislocation, spinal cord impingement   Co morbidities that complicate the patient evaluation  See HPI  Additional history obtained:  Additional history obtained from EMR External records from outside source obtained and reviewed including hospital records   Lab Tests:  I Ordered, and personally interpreted labs.  The pertinent results include: Mild leukocytosis of 11.1 likely reactive patient's pain.  No evidence anemia.  Platelets within normal range.  No electrolyte abnormalities noted.  Renal function within normal limits.  No transaminitis noted.   Imaging Studies ordered:  N/a   Cardiac Monitoring: / EKG:  The patient was maintained on a cardiac monitor.  I personally viewed and interpreted the cardiac monitored which showed an underlying rhythm of: Sinus rhythm   Consultations Obtained:  N/a   Problem List / ED Course / Critical interventions / Medication management  Cervical muscle strain I ordered medication including ibuprofen for pain   Reevaluation of the patient after these medicines showed that the patient resolved I have reviewed the patients home medicines and have made adjustments as needed   Social Determinants of Health:  Denies alcohol, illicit drug use.   Test / Admission - Considered:  Cervical strain Vitals signs within normal range and stable throughout visit. Laboratory studies significant for: See above Patient symptoms likely secondary to cervical strain from sleeping and opposition.  Patient noted resolution of symptoms with  administration of ibuprofen while emergency department.  I discussed at length with patient regarding auditory hallucinations that she is experiencing.  She elected against TTS consultation while emergency department.  She does not have active suicidal or homicidal ideation.  Auditory hallucinations are nonthreatening in nature.  She preferred outpatient follow-up with information provided.  She was deemed to have capacity and had no threat to harm herself or others.  Patient continued outpatient therapy of ibuprofen as well as ice/heat over affected area and adjusting sleeping positions.  Treatment plan discussed at length with patient she knowledge understanding was agreeable to said plan. Worrisome signs and symptoms were discussed with the patient, and the patient acknowledged understanding to return to the ED if noticed. Patient was stable upon discharge.          Final Clinical Impression(s) / ED Diagnoses Final diagnoses:  Acute strain of neck muscle, initial encounter    Rx / DC Orders ED Discharge Orders          Ordered    ibuprofen (ADVIL) 600 MG tablet  Every 6 hours PRN        03/18/22 1249              Wilnette Kales, [place] 03/18/22 1249    Lacretia Leigh, MD 03/21/22 1120

## 2022-03-18 NOTE — ED Provider Triage Note (Signed)
Emergency Medicine Provider Triage Evaluation Note  Bianca Medina , a 71 y.o. female  was evaluated in triage.  Pt complains of left-sided neck pain.  Patient states that symptoms been present for approximately past week to week and a half.  She noted initial stinging station at night.  She reports intermittent stinging sensation that is relieved by topical alcohol as well as warm compresses.  She is taken no medication for this.  She states that she has been able to triangulate between a telephone pole in her words, cell phone tower and an object in her house and is able to hear people's voices.  She states that the people's voices have been telling her that someone has been putting bugs on her when she sleeps that are possibly flesh eating.  She is concerned given stinging sensation and reported swelling over area due to these flesh eating bugs.  Denies fever, weakness/sensory deficits in upper extremities, known trauma/falls.  Denies history of drug use, alcohol use.  Review of Systems  Positive: See above Negative:   Physical Exam  BP (!) 139/58 (BP Location: Right Arm)   Pulse (!) 52   Temp 98.4 F (36.9 C) (Oral)   Resp 16   SpO2 96%  Gen:   Awake, no distress   Resp:  Normal effort  MSK:   Moves extremities without difficulty  Other:  Left paraspinal tenderness to palpation in the cervical region.  Pain is worsened with extension of the head as well as lateral rotation.  No overlying skin abnormalities noted.  Medical Decision Making  Medically screening exam initiated at 10:54 AM.  Appropriate orders placed.  BERNA GITTO was informed that the remainder of the evaluation will be completed by another provider, this initial triage assessment does not replace that evaluation, and the importance of remaining in the ED until their evaluation is complete.     Wilnette Kales, [place] 03/18/22 1056

## 2022-03-18 NOTE — Discharge Instructions (Addendum)
Note the work-up today was overall consistent with strain of the muscles in your neck.  As discussed, recommend continue ibuprofen therapy every 6 hours at home as well as alternating ice and heat.  Recommend follow-up with your PCP in 3 to 5 days for reevaluation of your symptoms as well as evaluating these voices that you are hearing.  Please not hesitate to return to emergency department if the worrisome signs and symptoms we discussed become apparent.

## 2022-03-18 NOTE — ED Notes (Signed)
All discharge instructions including follow up care and prescriptions reviewed with patient and patient verbalized understanding of same. Patient stable and ambulatory at time of discharge.  

## 2022-03-18 NOTE — ED Triage Notes (Signed)
Patient complains of left sided posterior neck pain where she feel something moving under her skin, patient states she is concerned it may be a flesh eating insect. Patient states she has not travelled anywhere that she thinks she may have encountered a flesh-eating insect. Patient is alert, oriented, and in no apparent distress at this time.

## 2022-03-18 NOTE — Telephone Encounter (Signed)
Called patient x 3 with no answer, patient may reschedule for the next available appointment .  L.Wilson,LPN

## 2022-03-20 NOTE — Telephone Encounter (Signed)
Left message for patient to call back and schedule Medicare Annual Wellness Visit (AWV).   Please offer to do virtually or by telephone.  Left office number and my jabber [phone].  Last AWV:01/24/2021   Please schedule at anytime with Nurse Health Advisor.

## 2022-03-23 ENCOUNTER — Telehealth: Payer: Self-pay | Admitting: *Deleted

## 2022-03-23 NOTE — Telephone Encounter (Signed)
Patient called requesting to have repeat mammogram done. I called patient and was not able to leave message her voicemail is full.

## 2022-03-26 DIAGNOSIS — R221 Localized swelling, mass and lump, neck: Secondary | ICD-10-CM | POA: Insufficient documentation

## 2022-03-27 ENCOUNTER — Telehealth: Payer: Self-pay

## 2022-03-27 DIAGNOSIS — M1711 Unilateral primary osteoarthritis, right knee: Secondary | ICD-10-CM | POA: Diagnosis not present

## 2022-03-27 DIAGNOSIS — M542 Cervicalgia: Secondary | ICD-10-CM | POA: Diagnosis not present

## 2022-03-27 NOTE — Telephone Encounter (Signed)
Pt went to the emergency last Friday and just wanted to let you know

## 2022-03-27 NOTE — Telephone Encounter (Signed)
     Patient  visit on 11/1  at [facility]   Have you been able to follow up with your primary care physician? YES  The patient was or was not able to obtain any needed medicine or equipment. YES  Are there diet recommendations that you are having difficulty following? NA  Patient expresses understanding of discharge instructions and education provided has no other needs at this time. [place], [facility], Care Management  [phone] [address], [place], [place] [address] Phone: [phone] Email: [email or web address]

## 2022-03-27 NOTE — Telephone Encounter (Signed)
FYI message below, called patient to check on her. No answer unable to LM

## 2022-03-31 ENCOUNTER — Ambulatory Visit (INDEPENDENT_AMBULATORY_CARE_PROVIDER_SITE_OTHER): Payer: Medicare PPO

## 2022-03-31 VITALS — Ht 65.0 in | Wt 265.0 lb

## 2022-03-31 DIAGNOSIS — Z0000 Encounter for general adult medical examination without abnormal findings: Secondary | ICD-10-CM

## 2022-03-31 DIAGNOSIS — M25552 Pain in left hip: Secondary | ICD-10-CM | POA: Diagnosis not present

## 2022-03-31 DIAGNOSIS — M545 Low back pain, unspecified: Secondary | ICD-10-CM | POA: Diagnosis not present

## 2022-03-31 NOTE — Patient Instructions (Signed)
[name] , Thank you for taking time to come for your Medicare Wellness Visit. I appreciate your ongoing commitment to your health goals. Please review the following plan we discussed and let me know if I can assist you in the future.   Screening recommendations/referrals: Colonoscopy: completed 12/03/2021, due 12/04/2026 Mammogram: completed 09/25/2021, due 09/27/2022 Bone Density: completed 06/11/2015 Recommended yearly ophthalmology/optometry visit for glaucoma screening and checkup Recommended yearly dental visit for hygiene and checkup  Vaccinations: Influenza vaccine: decline Pneumococcal vaccine: decline Tdap vaccine: due Shingles vaccine: decline   Covid-19: 02/13/2020, 10/31/2019, 10/07/2019  Advanced directives: Please bring a copy of your POA (Power of Attorney) and/or Living Will to your next appointment.   Conditions/risks identified: none  Next appointment: Follow up in one year for your annual wellness visit    Preventive Care 65 Years and Older, Female Preventive care refers to lifestyle choices and visits with your health care provider that can promote health and wellness. What does preventive care include? A yearly physical exam. This is also called an annual well check. Dental exams once or twice a year. Routine eye exams. Ask your health care provider how often you should have your eyes checked. Personal lifestyle choices, including: Daily care of your teeth and gums. Regular physical activity. Eating a healthy diet. Avoiding tobacco and drug use. Limiting alcohol use. Practicing safe sex. Taking low-dose aspirin every day. Taking vitamin and mineral supplements as recommended by your health care provider. What happens during an annual well check? The services and screenings done by your health care provider during your annual well check will depend on your age, overall health, lifestyle risk factors, and family history of disease. Counseling  Your health care  provider may ask you questions about your: Alcohol use. Tobacco use. Drug use. Emotional well-being. Home and relationship well-being. Sexual activity. Eating habits. History of falls. Memory and ability to understand (cognition). Work and work Statistician. Reproductive health. Screening  You may have the following tests or measurements: Height, weight, and BMI. Blood pressure. Lipid and cholesterol levels. These may be checked every 5 years, or more frequently if you are over 74 years old. Skin check. Lung cancer screening. You may have this screening every year starting at age 87 if you have a 30-pack-year history of smoking and currently smoke or have quit within the past 15 years. Fecal occult blood test (FOBT) of the stool. You may have this test every year starting at age 68. Flexible sigmoidoscopy or colonoscopy. You may have a sigmoidoscopy every 5 years or a colonoscopy every 10 years starting at age 44. Hepatitis C blood test. Hepatitis B blood test. Sexually transmitted disease (STD) testing. Diabetes screening. This is done by checking your blood sugar (glucose) after you have not eaten for a while (fasting). You may have this done every 1-3 years. Bone density scan. This is done to screen for osteoporosis. You may have this done starting at age 84. Mammogram. This may be done every 1-2 years. Talk to your health care provider about how often you should have regular mammograms. Talk with your health care provider about your test results, treatment options, and if necessary, the need for more tests. Vaccines  Your health care provider may recommend certain vaccines, such as: Influenza vaccine. This is recommended every year. Tetanus, diphtheria, and acellular pertussis (Tdap, Td) vaccine. You may need a Td booster every 10 years. Zoster vaccine. You may need this after age 31. Pneumococcal 13-valent conjugate (PCV13) vaccine. One dose is  recommended after age  76. Pneumococcal polysaccharide (PPSV23) vaccine. One dose is recommended after age 10. Talk to your health care provider about which screenings and vaccines you need and how often you need them. This information is not intended to replace advice given to you by your health care provider. Make sure you discuss any questions you have with your health care provider. Document Released: 05/31/2015 Document Revised: 01/22/2016 Document Reviewed: 03/05/2015 Elsevier Interactive Patient Education  2017 [employer] Prevention in the Home Falls can cause injuries. They can happen to people of all ages. There are many things you can do to make your home safe and to help prevent falls. What can I do on the outside of my home? Regularly fix the edges of walkways and driveways and fix any cracks. Remove anything that might make you trip as you walk through a door, such as a raised step or threshold. Trim any bushes or trees on the path to your home. Use bright outdoor lighting. Clear any walking paths of anything that might make someone trip, such as rocks or tools. Regularly check to see if handrails are loose or broken. Make sure that both sides of any steps have handrails. Any raised decks and porches should have guardrails on the edges. Have any leaves, snow, or ice cleared regularly. Use sand or salt on walking paths during winter. Clean up any spills in your garage right away. This includes oil or grease spills. What can I do in the bathroom? Use night lights. Install grab bars by the toilet and in the tub and shower. Do not use towel bars as grab bars. Use non-skid mats or decals in the tub or shower. If you need to sit down in the shower, use a plastic, non-slip stool. Keep the floor dry. Clean up any water that spills on the floor as soon as it happens. Remove soap buildup in the tub or shower regularly. Attach bath mats securely with double-sided non-slip rug tape. Do not have throw  rugs and other things on the floor that can make you trip. What can I do in the bedroom? Use night lights. Make sure that you have a light by your bed that is easy to reach. Do not use any sheets or blankets that are too big for your bed. They should not hang down onto the floor. Have a firm chair that has side arms. You can use this for support while you get dressed. Do not have throw rugs and other things on the floor that can make you trip. What can I do in the kitchen? Clean up any spills right away. Avoid walking on wet floors. Keep items that you use a lot in easy-to-reach places. If you need to reach something above you, use a strong step stool that has a grab bar. Keep electrical cords out of the way. Do not use floor polish or wax that makes floors slippery. If you must use wax, use non-skid floor wax. Do not have throw rugs and other things on the floor that can make you trip. What can I do with my stairs? Do not leave any items on the stairs. Make sure that there are handrails on both sides of the stairs and use them. Fix handrails that are broken or loose. Make sure that handrails are as long as the stairways. Check any carpeting to make sure that it is firmly attached to the stairs. Fix any carpet that is loose or worn. Avoid having  throw rugs at the top or bottom of the stairs. If you do have throw rugs, attach them to the floor with carpet tape. Make sure that you have a light switch at the top of the stairs and the bottom of the stairs. If you do not have them, ask someone to add them for you. What else can I do to help prevent falls? Wear shoes that: Do not have high heels. Have rubber bottoms. Are comfortable and fit you well. Are closed at the toe. Do not wear sandals. If you use a stepladder: Make sure that it is fully opened. Do not climb a closed stepladder. Make sure that both sides of the stepladder are locked into place. Ask someone to hold it for you, if  possible. Clearly mark and make sure that you can see: Any grab bars or handrails. First and last steps. Where the edge of each step is. Use tools that help you move around (mobility aids) if they are needed. These include: Canes. Walkers. Scooters. Crutches. Turn on the lights when you go into a dark area. Replace any light bulbs as soon as they burn out. Set up your furniture so you have a clear path. Avoid moving your furniture around. If any of your floors are uneven, fix them. If there are any pets around you, be aware of where they are. Review your medicines with your doctor. Some medicines can make you feel dizzy. This can increase your chance of falling. Ask your doctor what other things that you can do to help prevent falls. This information is not intended to replace advice given to you by your health care provider. Make sure you discuss any questions you have with your health care provider. Document Released: 02/28/2009 Document Revised: 10/10/2015 Document Reviewed: 06/08/2014 Elsevier Interactive Patient Education  2017 [employer].

## 2022-03-31 NOTE — Progress Notes (Signed)
I connected with Bianca Medina today by telephone and verified that I am speaking with the correct person using two identifiers. Location patient: home Location provider: work Persons participating in the virtual visit: Gratia, Disla LPN.   I discussed the limitations, risks, security and privacy concerns of performing an evaluation and management service by telephone and the availability of in person appointments. I also discussed with the patient that there may be a patient responsible charge related to this service. The patient expressed understanding and verbally consented to this telephonic visit.    Interactive audio and video telecommunications were attempted between this provider and patient, however failed, due to patient having technical difficulties OR patient did not have access to video capability.  We continued and completed visit with audio only.     Vital signs may be patient reported or missing.  Subjective:   Bianca Medina is a 71 y.o. female who presents for Medicare Annual (Subsequent) preventive examination.  Review of Systems     Cardiac Risk Factors include: advanced age (>69mn, >>60women);hypertension;obesity (BMI >30kg/m2)     Objective:    Today's Vitals   03/31/22 1512  Weight: 265 lb (120.2 kg)  Height: '5\' 5"'$  (1.651 m)   Body mass index is 44.1 kg/m.     03/31/2022    3:16 PM 01/08/2022    8:15 AM 01/05/2022   10:38 AM 12/03/2021    9:44 AM 09/05/2021    8:37 AM 08/01/2021   10:16 AM 07/14/2021   10:16 AM  Advanced Directives  Does Patient Have a Medical Advance Directive? Yes Yes Yes Yes Yes No No  Type of [place]aramed[place]of [place]un City CenterLiving will  [place] will [place] will     Does patient want to make changes to medical advance directive?  No - Patient declined No - Patient declined      Copy of HBelle Havenin Chart? No - copy requested No -  copy requested, Physician notified No - copy requested No - copy requested     Would patient like information on creating a medical advance directive?       No - Patient declined    Current Medications (verified) Outpatient Encounter Medications as of 03/31/2022  Medication Sig   aspirin EC 81 MG tablet Take 81 mg by mouth daily. Swallow whole.   atorvastatin (LIPITOR) 10 MG tablet Take 10 mg by mouth daily.   Biotin 5000 MCG CAPS Take 5,000 mcg by mouth daily.    Casanthranol-Docusate Sodium (LAXATIVE PLUS STOOL SOFTENER PO) Take by mouth.   chlorthalidone (HYGROTON) 25 MG tablet TAKE 1 TABLET (25 MG TOTAL) BY MOUTH DAILY.   esomeprazole (NEXIUM) 40 MG capsule Take 40 mg by mouth daily.    fluticasone (FLONASE) 50 MCG/ACT nasal spray Place 2 sprays into both nostrils daily.   ibuprofen (ADVIL) 600 MG tablet Take 1 tablet (600 mg total) by mouth every 6 (six) hours as needed.   losartan (COZAAR) 25 MG tablet Take 25 mg by mouth daily.   meloxicam (MOBIC) 15 MG tablet Take 15 mg by mouth daily.   metFORMIN (GLUCOPHAGE-XR) 500 MG 24 hr tablet TAKE 1 TABLET BY MOUTH EVERY DAY WITH BREAKFAST   montelukast (SINGULAIR) 10 MG tablet Take 10 mg by mouth daily.   Potassium Chloride ER 20 MEQ TBCR Take 1 tablet by mouth daily.   PROAIR HFA 108 (90 Base) MCG/ACT inhaler Inhale 1-2 puffs into the  lungs every 4 (four) hours as needed for wheezing or shortness of breath.    SYMBICORT 80-4.5 MCG/ACT inhaler Inhale 2 puffs into the lungs 2 (two) times daily.   Vitamin D, Ergocalciferol, (DRISDOL) 1.25 MG (50000 UNIT) CAPS capsule Take 50,000 Units by mouth once a week.   vitamin E 1000 UNIT capsule Take 1,000 Units by mouth daily.   No facility-administered encounter medications on file as of 03/31/2022.    Allergies (verified) Codeine   History: Past Medical History:  Diagnosis Date   Adenomatous colon polyp 2021   Anemia 2003   IRON DEFICIENCY    Asthma    Breast cancer ([place])    right breast    Chronic anal fissure    Common migraine 06/23/2019   Diabetes mellitus without complication (HCC)    GERD (gastroesophageal reflux disease)    Hearing loss    Hyperlipidemia, mild    Hypertension    IDA (iron deficiency anemia)    NSVD (normal spontaneous vaginal delivery)    X3   Obesity    PMDD (premenstrual dysphoric disorder)    Sensorineural hearing loss (SNHL) of both ears    Sleep disorder    Past Surgical History:  Procedure Laterality Date   ABDOMINAL HYSTERECTOMY     BLADDER REPAIR     BREAST BIOPSY Right    2023   COLONOSCOPY  11/2019   1 hpp and 1 tubl adenoma   COLONOSCOPY WITH PROPOFOL N/A 12/03/2021   Procedure: COLONOSCOPY WITH PROPOFOL;  Surgeon: Robert Bellow, MD;  Location: [facility];  Service: Endoscopy;  Laterality: N/A;   GUM SURGERY  1980's   MANDIBLE FRACTURE SURGERY     TUBAL LIGATION     Family History  Problem Relation Age of Onset   Cancer Mother        LUNG- SMOKER   Diabetes Father    Hypertension Father    Heart disease Father    Stroke Father    Breast cancer Sister        lates 6s   Cancer Paternal Aunt        COLON   Cancer Paternal Uncle        COLON   Social History   Socioeconomic History   Marital status: Single    Spouse name: Not on file   Number of children: 3   Years of education: college   Highest education level: Not on file  Occupational History   Occupation: Architectural technologist: [employer]  Tobacco Use   Smoking status: Never   Smokeless tobacco: Never  Vaping Use   Vaping Use: Never used  Substance and Sexual Activity   Alcohol use: Not Currently   Drug use: Never   Sexual activity: Not Currently    Birth control/protection: Surgical    Comment: HYSTERECTOMY-1st intercourse 53 yo-5 partners  Other Topics Concern   Not on file  Social History Narrative   Lives alone   Right handed     Caffeine use: soda/tea couple times a week   Social  Determinants of Health   Financial Resource Strain: Low Risk  (03/31/2022)   Overall Financial Resource Strain (CARDIA)    Difficulty of Paying Living Expenses: Not hard at all  Food Insecurity: No Food Insecurity (03/31/2022)   Hunger Vital Sign    Worried About Running Out of Food in the Last Year: Never true    [place] in the Last Year:  Never true  Transportation Needs: No Transportation Needs (03/31/2022)   PRAPARE - Hydrologist (Medical): No    Lack of Transportation (Non-Medical): No  Physical Activity: Inactive (03/31/2022)   Exercise Vital Sign    Days of Exercise per Week: 0 days    Minutes of Exercise per Session: 0 min  Stress: No Stress Concern Present (03/31/2022)   [place]    Feeling of Stress : Not at all  Social Connections: Not on file    Tobacco Counseling Counseling given: Not Answered   Clinical Intake:  Pre-visit preparation completed: Yes  Pain : No/denies pain     Nutritional Status: BMI > 30  Obese Nutritional Risks: None Diabetes: No  How often do you need to have someone help you when you read instructions, pamphlets, or other written materials from your doctor or pharmacy?: 1 - Never  Diabetic? no  Interpreter Needed?: No  Information entered by :: NAllen LPN   Activities of Daily Living    03/31/2022    3:17 PM  In your present state of health, do you have any difficulty performing the following activities:  Hearing? 0  Vision? 0  Difficulty concentrating or making decisions? 0  Walking or climbing stairs? 1  Comment due to knees  Dressing or bathing? 0  Doing errands, shopping? 0  Preparing Food and eating ? N  Using the Toilet? N  In the past six months, have you accidently leaked urine? N  Do you have problems with loss of bowel control? N  Managing your Medications? N  Managing your Finances? N  Housekeeping or  managing your Housekeeping? N    Patient Care Team: Libby Maw, MD as PCP - General (Family Medicine)  Indicate any recent Medical Services you may have received from other than [facility] providers in the past year (date may be approximate).     Assessment:   This is a routine wellness examination for Bianca Medina.  Hearing/Vision screen Vision Screening - Comments:: Regular eye exams, [facility]  Dietary issues and exercise activities discussed: Current Exercise Habits: The patient does not participate in regular exercise at present   Goals Addressed             This Visit's Progress    Patient Stated       03/31/2022, wants to start walking again       Depression Screen    03/31/2022    3:17 PM 01/26/2022    1:47 PM 11/10/2021    9:10 AM 05/13/2021   11:35 AM 03/19/2021   11:02 AM 01/24/2021   11:00 AM 11/27/2020   11:11 AM  PHQ 2/9 Scores  PHQ - 2 Score 0 0 0 0 0 0 0  PHQ- 9 Score      1 7    Fall Risk    03/31/2022    3:17 PM 01/26/2022    1:48 PM 11/10/2021    9:10 AM 05/13/2021   11:36 AM 03/19/2021   11:02 AM  [place] in the past year? 0 0 0 0 0  Number falls in past yr: 0 0 0 0 0  Injury with Fall? 0      Risk for fall due to : Medication side effect      Follow up Falls prevention discussed;Education provided;Falls evaluation completed        FALL RISK PREVENTION PERTAINING TO THE  HOME:  Any stairs in or around the home? No  If so, are there any without handrails? N/a Home free of loose throw rugs in walkways, pet beds, electrical cords, etc? Yes  Adequate lighting in your home to reduce risk of falls? Yes   ASSISTIVE DEVICES UTILIZED TO PREVENT FALLS:  Life alert? No  Use of a cane, walker or w/c? No  Grab bars in the bathroom? No  Shower chair or bench in shower? No  Elevated toilet seat or a handicapped toilet? No   TIMED UP AND GO:  Was the test performed? No .      Cognitive Function:        03/31/2022     3:18 PM 01/24/2021   11:00 AM  6CIT Screen  What Year? 0 points 0 points  What month? 0 points 0 points  What time? 0 points 0 points  Count back from 20 0 points 0 points  Months in reverse 0 points 0 points  Repeat phrase 2 points 0 points  Total Score 2 points 0 points    Immunizations Immunization History  Administered Date(s) Administered   Fluad Quad(high Dose 65+) 03/19/2021   Influenza Inj Mdck Quad Pf 04/22/2018   Influenza Whole 04/25/2012   Influenza,inj,Quad PF,6+ Mos 02/20/2019   Moderna Sars-Covid-2 Vaccination 10/07/2019, 10/31/2019, 02/13/2020   Pneumococcal Conjugate-13 04/25/2012   Tdap 02/18/2012    TDAP status: Due, Education has been provided regarding the importance of this vaccine. Advised may receive this vaccine at local pharmacy or Health Dept. Aware to provide a copy of the vaccination record if obtained from local pharmacy or Health Dept. Verbalized acceptance and understanding.  Flu Vaccine status: Declined, Education has been provided regarding the importance of this vaccine but patient still declined. Advised may receive this vaccine at local pharmacy or Health Dept. Aware to provide a copy of the vaccination record if obtained from local pharmacy or Health Dept. Verbalized acceptance and understanding.  Pneumococcal vaccine status: Completed during today's visit.  Covid-19 vaccine status: Completed vaccines  Qualifies for Shingles Vaccine? Yes   Zostavax completed No   Shingrix Completed?: No.    Education has been provided regarding the importance of this vaccine. Patient has been advised to call insurance company to determine out of pocket expense if they have not yet received this vaccine. Advised may also receive vaccine at local pharmacy or Health Dept. Verbalized acceptance and understanding.  Screening Tests Health Maintenance  Topic Date Due   Medicare Annual Wellness (AWV)  Never done   TETANUS/TDAP  02/17/2022   Diabetic kidney  evaluation - Urine ACR  03/19/2022   MAMMOGRAM  09/26/2022   Diabetic kidney evaluation - GFR measurement  03/19/2023   COLONOSCOPY (Pts 45-58yr Insurance coverage will need to be confirmed)  12/04/2031   DEXA SCAN  Completed   Hepatitis C Screening  Completed   HPV VACCINES  Aged Out   Pneumonia Vaccine 70 Years old  Discontinued   INFLUENZA VACCINE  Discontinued   COVID-19 Vaccine  Discontinued   Zoster Vaccines- Shingrix  Discontinued    Health Maintenance  Health Maintenance Due  Topic Date Due   Medicare Annual Wellness (AWV)  Never done   TETANUS/TDAP  02/17/2022   Diabetic kidney evaluation - Urine ACR  03/19/2022    Colorectal cancer screening: Type of screening: Colonoscopy. Completed 12/03/2021. Repeat every 5 years  Mammogram status: Completed 09/25/2021. Repeat every year  Bone Density status: Completed 06/11/2015.   Lung Cancer Screening: (Low  Dose CT Chest recommended if Age 37-80 years, 30 pack-year currently smoking OR have quit w/in 15years.) does not qualify.   Lung Cancer Screening Referral: no  Additional Screening:  Hepatitis C Screening: does qualify; Completed 05/02/2015  Vision Screening: Recommended annual ophthalmology exams for early detection of glaucoma and other disorders of the eye. Is the patient up to date with their annual eye exam?  Yes  Who is the provider or what is the name of the office in which the patient attends annual eye exams? [facility]  If pt is not established with a provider, would they like to be referred to a provider to establish care? No .   Dental Screening: Recommended annual dental exams for proper oral hygiene  Community Resource Referral / Chronic Care Management: CRR required this visit?  No   CCM required this visit?  No      Plan:     I have personally reviewed and noted the following in the patient's chart:   Medical and social history Use of alcohol, tobacco or illicit drugs  Current medications  and supplements including opioid prescriptions. Patient is not currently taking opioid prescriptions. Functional ability and status Nutritional status Physical activity Advanced directives List of other physicians Hospitalizations, surgeries, and ER visits in previous 12 months Vitals Screenings to include cognitive, depression, and falls Referrals and appointments  In addition, I have reviewed and discussed with patient certain preventive protocols, quality metrics, and best practice recommendations. A written personalized care plan for preventive services as well as general preventive health recommendations were provided to patient.     Kellie Simmering, LPN   96/22/2979   Nurse Notes: none  Due to this being a virtual visit, the after visit summary with patients personalized plan was offered to patient via mail or my-chart.  Patient would like to access on my-chart

## 2022-04-01 ENCOUNTER — Emergency Department (HOSPITAL_COMMUNITY)
Admission: EM | Admit: 2022-04-01 | Discharge: 2022-04-01 | Discharge status: Against Medical Advice | Payer: Medicare PPO

## 2022-04-01 ENCOUNTER — Other Ambulatory Visit: Payer: Self-pay

## 2022-04-01 NOTE — ED Notes (Signed)
Pt stated she "was going to come back tomorrow". Pt was advised to stay but decided to leave.

## 2022-04-03 DIAGNOSIS — M5416 Radiculopathy, lumbar region: Secondary | ICD-10-CM | POA: Diagnosis not present

## 2022-04-03 DIAGNOSIS — M6281 Muscle weakness (generalized): Secondary | ICD-10-CM | POA: Diagnosis not present

## 2022-04-16 ENCOUNTER — Ambulatory Visit (HOSPITAL_COMMUNITY)
Admission: EM | Admit: 2022-04-16 | Discharge: 2022-04-16 | Disposition: A | Discharge status: Home / Self Care | Payer: Medicare PPO | Attending: Internal Medicine | Admitting: Internal Medicine

## 2022-04-16 ENCOUNTER — Telehealth: Payer: Self-pay | Admitting: Family Medicine

## 2022-04-16 ENCOUNTER — Encounter (HOSPITAL_COMMUNITY): Payer: Self-pay | Admitting: Emergency Medicine

## 2022-04-16 ENCOUNTER — Other Ambulatory Visit: Payer: Self-pay

## 2022-04-16 DIAGNOSIS — M6281 Muscle weakness (generalized): Secondary | ICD-10-CM | POA: Diagnosis not present

## 2022-04-16 DIAGNOSIS — M5416 Radiculopathy, lumbar region: Secondary | ICD-10-CM | POA: Diagnosis not present

## 2022-04-16 DIAGNOSIS — L03116 Cellulitis of left lower limb: Secondary | ICD-10-CM

## 2022-04-16 MED ORDER — CEPHALEXIN 500 MG PO CAPS: 500.0000 mg | ORAL_CAPSULE | Freq: Three times a day (TID) | ORAL | 0 refills | Status: DC

## 2022-04-16 MED ORDER — FUROSEMIDE 20 MG PO TABS: 20.0000 mg | ORAL_TABLET | Freq: Every day | ORAL | 0 refills | Status: DC

## 2022-04-16 NOTE — Telephone Encounter (Signed)
Caller Name: Ethlyn Alto  Call back phone #: [phone]  Reason for Call: Pt has some swelling in leg. Please call to speak about recommendations with handling.

## 2022-04-16 NOTE — ED Triage Notes (Signed)
Patient has pain in left lower leg.  Patient says about one week ago, she felt "something"  patient does not know what she felt, but it hurt and is still hurting.  Left lower leg/ankle is swollen, redness and warmth, pain to lower third of left lower leg.  Has a puffy area just below left knee that patient reports having been seen for in the past, but no diagnosis

## 2022-04-16 NOTE — ED Provider Notes (Addendum)
[facility]    CSN: [id number] Arrival date & time: 04/16/22  1625      History   Chief Complaint Chief Complaint  Patient presents with   Leg Pain    HPI Bianca Medina is a 71 y.o. female comes to the urgent care with pain of the left leg for a week duration.  Symptoms started a week ago and has been worsening.  Pain is throbbing, sharp, aggravated by palpation with no known relieving factors.  It is associated with redness of the left leg and swelling in both lower extremities.  Patient denies any history of congestive heart failure.  She denies orthopnea paroxysmal nocturnal dyspnea or shortness of breath.  No chest pain or chest pressure.  Patient denies any insect bites or wounds on the lower extremities.  No fever or chills. HPI  Past Medical History:  Diagnosis Date   Adenomatous colon polyp 2021   Anemia 2003   IRON DEFICIENCY    Asthma    Breast cancer ([place])    right breast   Chronic anal fissure    Common migraine 06/23/2019   Diabetes mellitus without complication (HCC)    GERD (gastroesophageal reflux disease)    Hearing loss    Hyperlipidemia, mild    Hypertension    IDA (iron deficiency anemia)    NSVD (normal spontaneous vaginal delivery)    X3   Obesity    PMDD (premenstrual dysphoric disorder)    Sensorineural hearing loss (SNHL) of both ears    Sleep disorder     Patient Active Problem List   Diagnosis Date Noted   Slow transit constipation 01/26/2022   Left leg swelling 11/10/2021   Elevated cholesterol 11/10/2021   Bruising 06/11/2021   Other schizoaffective disorders ([place]) 04/30/2021   Thyroid nodule 04/30/2021   Screening for colon cancer 04/01/2021   Insect bite of right wrist 04/01/2021   Bite, insect 04/01/2021   Rash 03/28/2021   Iron deficiency 03/19/2021   Need for influenza vaccination 03/19/2021   Snoring 01/27/2021   Sleep related bruxism 01/27/2021   Gasping for breath 01/27/2021   Insomnia due to anxiety and  fear 01/27/2021   Class 3 severe obesity due to excess calories with serious comorbidity and body mass index (BMI) of 40.0 to 44.9 in adult [facility]) 01/27/2021   Medicare annual wellness visit, subsequent 01/24/2021   Dehydration 12/26/2020   History of syncope 12/26/2020   Hospital discharge follow-up 12/26/2020   Healthcare maintenance 12/01/2020   Vitamin D deficiency 12/01/2020   Ear itching 06/27/2019   Common migraine 06/23/2019   History of anemia 03/31/2019   Pre-diabetes 03/31/2019   Viral syndrome 03/31/2019   Sensorineural hearing loss (SNHL), bilateral 08/12/2017   Tinnitus of both ears 08/12/2017   Morbid obesity ([place]) 12/28/2016   Left breast mass 07/19/2014   Breast tenderness in female 07/19/2014   Menopause 01/09/2014   Vaginal atrophy 01/09/2014   Skin lesion 12/23/2012   Essential hypertension 10/31/2012   Heartburn 10/31/2012    Past Surgical History:  Procedure Laterality Date   ABDOMINAL HYSTERECTOMY     BLADDER REPAIR     BREAST BIOPSY Right    2023   COLONOSCOPY  11/2019   1 hpp and 1 tubl adenoma   COLONOSCOPY WITH PROPOFOL N/A 12/03/2021   Procedure: COLONOSCOPY WITH PROPOFOL;  Surgeon: Robert Bellow, MD;  Location: [facility] ENDOSCOPY;  Service: Endoscopy;  Laterality: N/A;   GUM SURGERY  1980's   MANDIBLE FRACTURE SURGERY  TUBAL LIGATION      OB History     Gravida  3   Para  3   Term      Preterm      AB      Living  3      SAB      IAB      Ectopic      Multiple      Live Births               Home Medications    Prior to Admission medications   Medication Sig Start Date End Date Taking? Authorizing Provider  cephALEXin (KEFLEX) 500 MG capsule Take 1 capsule (500 mg total) by mouth 3 (three) times daily for 5 days. 04/16/22 04/21/22 Yes Coyt Govoni, Myrene Galas, MD  furosemide (LASIX) 20 MG tablet Take 1 tablet (20 mg total) by mouth daily. 04/16/22  Yes Viha Kriegel, Myrene Galas, MD  aspirin EC 81 MG tablet Take 81 mg by  mouth daily. Swallow whole.    [provider]  atorvastatin (LIPITOR) 10 MG tablet Take 10 mg by mouth daily. 12/05/21   [provider]  Biotin 5000 MCG CAPS Take 5,000 mcg by mouth daily.     [provider]  Casanthranol-Docusate Sodium (LAXATIVE PLUS STOOL SOFTENER PO) Take by mouth.    [provider]  chlorthalidone (HYGROTON) 25 MG tablet TAKE 1 TABLET (25 MG TOTAL) BY MOUTH DAILY. 02/24/22   Libby Maw, MD  esomeprazole (NEXIUM) 40 MG capsule Take 40 mg by mouth daily.     [provider]  fluticasone (FLONASE) 50 MCG/ACT nasal spray Place 2 sprays into both nostrils daily. 05/13/21   Libby Maw, MD  ibuprofen (ADVIL) 600 MG tablet Take 1 tablet (600 mg total) by mouth every 6 (six) hours as needed. 03/18/22   Wilnette Kales, PA  losartan (COZAAR) 25 MG tablet Take 25 mg by mouth daily. 07/23/21   [provider]  meloxicam (MOBIC) 15 MG tablet Take 15 mg by mouth daily. 07/03/21   [provider]  metFORMIN (GLUCOPHAGE-XR) 500 MG 24 hr tablet TAKE 1 TABLET BY MOUTH EVERY DAY WITH BREAKFAST 12/24/21   Libby Maw, MD  montelukast (SINGULAIR) 10 MG tablet Take 10 mg by mouth daily. 12/08/21   [provider]  Potassium Chloride ER 20 MEQ TBCR Take 1 tablet by mouth daily. 11/10/21   [provider]  PROAIR HFA 108 (90 Base) MCG/ACT inhaler Inhale 1-2 puffs into the lungs every 4 (four) hours as needed for wheezing or shortness of breath.  03/16/18   [provider]  SYMBICORT 80-4.5 MCG/ACT inhaler Inhale 2 puffs into the lungs 2 (two) times daily. 12/08/21   [provider]  Vitamin D, Ergocalciferol, (DRISDOL) 1.25 MG (50000 UNIT) CAPS capsule Take 50,000 Units by mouth once a week. 11/29/21   [provider]  vitamin E 1000 UNIT capsule Take 1,000 Units by mouth daily.    [provider]    Family History Family History  Problem Relation Age  of Onset   Cancer Mother        LUNG- SMOKER   Diabetes Father    Hypertension Father    Heart disease Father    Stroke Father    Breast cancer Sister        lates 68s   Cancer Paternal Aunt        COLON   Cancer Paternal Uncle  COLON    Social History Social History   Tobacco Use   Smoking status: Never   Smokeless tobacco: Never  Vaping Use   Vaping Use: Never used  Substance Use Topics   Alcohol use: Not Currently   Drug use: Never     Allergies   Codeine   Review of Systems Review of Systems As per HPI  Physical Exam Triage Vital Signs ED Triage Vitals  Enc Vitals Group     BP 04/16/22 1802 (!) 155/78     Pulse Rate 04/16/22 1802 60     Resp 04/16/22 1802 20     Temp 04/16/22 1802 98.6 F (37 C)     Temp Source 04/16/22 1802 Oral     SpO2 04/16/22 1802 100 %     Weight --      Height --      Head Circumference --      Peak Flow --      Pain Score 04/16/22 1800 4     Pain Loc --      Pain Edu? --      Excl. in [place]? --    No data found.  Updated Vital Signs BP (!) 155/78 (BP Location: Right Arm) Comment (BP Location): regular cuff, forearm  Pulse 60   Temp 98.6 F (37 C) (Oral)   Resp 20   SpO2 100%   Visual Acuity Right Eye Distance:   Left Eye Distance:   Bilateral Distance:    Right Eye Near:   Left Eye Near:    Bilateral Near:     Physical Exam Vitals and nursing note reviewed.  Constitutional:      General: She is not in acute distress.    Appearance: She is not ill-appearing.  Cardiovascular:     Rate and Rhythm: Normal rate and regular rhythm.     Pulses: Normal pulses.     Heart sounds: Normal heart sounds.  Pulmonary:     Effort: Pulmonary effort is normal.     Breath sounds: Normal breath sounds.  Abdominal:     General: Bowel sounds are normal.     Palpations: Abdomen is soft.  Musculoskeletal:        General: Swelling and tenderness present. No deformity.     Right lower leg: Edema present.     Left lower  leg: Edema present.  Skin:    Capillary Refill: Capillary refill takes less than 2 seconds.     Coloration: Skin is not jaundiced.     Findings: Erythema present. No bruising or lesion.  Neurological:     General: No focal deficit present.     Mental Status: She is alert.      UC Treatments / Results  Labs (all labs ordered are listed, but only abnormal results are displayed) Labs Reviewed - No data to display  EKG   Radiology   Procedures Procedures (including critical care time)  Medications Ordered in UC Medications - No data to display  Initial Impression / Assessment and Plan / UC Course  I have reviewed the triage vital signs and the nursing notes.  Pertinent labs & imaging results that were available during my care of the patient were reviewed by me and considered in my medical decision making (see chart for details).     1.  Cellulitis of the left leg: Keflex 500 mg 3 times daily for 5 days Furosemide 20 mg orally daily Decrease salt intake to less than 2 g a  day Patient is advised to weigh self daily If symptoms persist patient is advised to return to urgent care Elevation of lower extremities advised. Final Clinical Impressions(s) / UC Diagnoses   Final diagnoses:  Cellulitis of leg, left     Discharge Instructions      Please elevate both legs when you are sitting or when you are asleep you could put a couple of pillows under your feet to help with the swelling Please weigh yourself and take your blood pressureat least twice a week. Please take medications as prescribed Return to urgent care if symptoms worsen.      ED Prescriptions     Medication Sig Dispense Auth. Provider   furosemide (LASIX) 20 MG tablet Take 1 tablet (20 mg total) by mouth daily. 5 tablet Sereen Schaff, Myrene Galas, MD   cephALEXin (KEFLEX) 500 MG capsule Take 1 capsule (500 mg total) by mouth 3 (three) times daily for 5 days. 15 capsule Clarabell Matsuoka, Myrene Galas, MD      PDMP not  reviewed this encounter.   Chase Picket, MD 04/19/22 1407    Chase Picket, MD 04/19/22 [phone]

## 2022-04-16 NOTE — Discharge Instructions (Addendum)
Please elevate both legs when you are sitting or when you are asleep you could put a couple of pillows under your feet to help with the swelling Please weigh yourself and take your blood pressureat least twice a week. Please take medications as prescribed Return to urgent care if symptoms worsen.

## 2022-04-17 ENCOUNTER — Emergency Department (HOSPITAL_COMMUNITY)
Admission: EM | Admit: 2022-04-17 | Discharge: 2022-04-17 | Disposition: A | Discharge status: Home / Self Care | Payer: Medicare PPO | Attending: Emergency Medicine | Admitting: Emergency Medicine

## 2022-04-17 ENCOUNTER — Encounter (HOSPITAL_COMMUNITY): Payer: Self-pay

## 2022-04-17 ENCOUNTER — Emergency Department (HOSPITAL_BASED_OUTPATIENT_CLINIC_OR_DEPARTMENT_OTHER): Admit: 2022-04-17 | Discharge: 2022-04-17 | Disposition: A | Discharge status: Home / Self Care | Payer: Medicare PPO

## 2022-04-17 ENCOUNTER — Emergency Department (HOSPITAL_COMMUNITY): Payer: Medicare PPO

## 2022-04-17 ENCOUNTER — Other Ambulatory Visit: Payer: Self-pay

## 2022-04-17 DIAGNOSIS — Z79899 Other long term (current) drug therapy: Secondary | ICD-10-CM | POA: Diagnosis not present

## 2022-04-17 DIAGNOSIS — Z7984 Long term (current) use of oral hypoglycemic drugs: Secondary | ICD-10-CM | POA: Insufficient documentation

## 2022-04-17 DIAGNOSIS — R609 Edema, unspecified: Secondary | ICD-10-CM

## 2022-04-17 DIAGNOSIS — E119 Type 2 diabetes mellitus without complications: Secondary | ICD-10-CM | POA: Diagnosis not present

## 2022-04-17 DIAGNOSIS — M79605 Pain in left leg: Secondary | ICD-10-CM

## 2022-04-17 DIAGNOSIS — Z7982 Long term (current) use of aspirin: Secondary | ICD-10-CM | POA: Diagnosis not present

## 2022-04-17 DIAGNOSIS — I10 Essential (primary) hypertension: Secondary | ICD-10-CM | POA: Insufficient documentation

## 2022-04-17 DIAGNOSIS — M7989 Other specified soft tissue disorders: Secondary | ICD-10-CM | POA: Diagnosis not present

## 2022-04-17 DIAGNOSIS — L03116 Cellulitis of left lower limb: Secondary | ICD-10-CM | POA: Diagnosis not present

## 2022-04-17 MED ORDER — CEPHALEXIN 250 MG PO CAPS
500.0000 mg | ORAL_CAPSULE | Freq: Once | ORAL | Status: AC
  Administered 2022-04-17: 500 mg via ORAL
  Filled 2022-04-17: qty 2

## 2022-04-17 NOTE — Progress Notes (Signed)
Left lower extremity venous duplex has been completed. Preliminary results can be found in CV Proc through chart review.  Results were given to [facility].  04/17/22 5:14 PM Carlos Levering RVT

## 2022-04-17 NOTE — ED Provider Notes (Signed)
[facility] Provider Note   CSN: [id number] Arrival date & time: 04/17/22  1044     History  Chief Complaint  Patient presents with   Leg Pain    Bianca Medina is a 71 y.o. female.   Leg Pain The patient is a 71 year old female with past medical history of HTN, HLD, GERD, DM, schizoaffective disorder presenting for evaluation of left leg pain for the past week.  The patient was previously seen at an urgent care yesterday where she was diagnosed with cellulitis and given a prescription for Keflex.  She states that 1 week ago she developed pain and swelling with associated bruising in the left lower extremity.  She denies recent trauma, numbness, weakness, fevers, nausea, vomiting.      Home Medications Prior to Admission medications   Medication Sig Start Date End Date Taking? Authorizing Provider  aspirin EC 81 MG tablet Take 81 mg by mouth daily. Swallow whole.    [provider]  atorvastatin (LIPITOR) 10 MG tablet Take 10 mg by mouth daily. 12/05/21   [provider]  Biotin 5000 MCG CAPS Take 5,000 mcg by mouth daily.     [provider]  Casanthranol-Docusate Sodium (LAXATIVE PLUS STOOL SOFTENER PO) Take by mouth.    [provider]  cephALEXin (KEFLEX) 500 MG capsule Take 1 capsule (500 mg total) by mouth 3 (three) times daily for 5 days. 04/16/22 04/21/22  Chase Picket, MD  chlorthalidone (HYGROTON) 25 MG tablet TAKE 1 TABLET (25 MG TOTAL) BY MOUTH DAILY. 02/24/22   Libby Maw, MD  esomeprazole (NEXIUM) 40 MG capsule Take 40 mg by mouth daily.     [provider]  fluticasone (FLONASE) 50 MCG/ACT nasal spray Place 2 sprays into both nostrils daily. 05/13/21   Libby Maw, MD  furosemide (LASIX) 20 MG tablet Take 1 tablet (20 mg total) by mouth daily. 04/16/22   LampteyMyrene Galas, MD  ibuprofen (ADVIL) 600 MG tablet Take 1 tablet (600 mg total) by mouth every 6  (six) hours as needed. 03/18/22   Wilnette Kales, PA  losartan (COZAAR) 25 MG tablet Take 25 mg by mouth daily. 07/23/21   [provider]  meloxicam (MOBIC) 15 MG tablet Take 15 mg by mouth daily. 07/03/21   [provider]  metFORMIN (GLUCOPHAGE-XR) 500 MG 24 hr tablet TAKE 1 TABLET BY MOUTH EVERY DAY WITH BREAKFAST 12/24/21   Libby Maw, MD  montelukast (SINGULAIR) 10 MG tablet Take 10 mg by mouth daily. 12/08/21   [provider]  Potassium Chloride ER 20 MEQ TBCR Take 1 tablet by mouth daily. 11/10/21   [provider]  PROAIR HFA 108 (90 Base) MCG/ACT inhaler Inhale 1-2 puffs into the lungs every 4 (four) hours as needed for wheezing or shortness of breath.  03/16/18   [provider]  SYMBICORT 80-4.5 MCG/ACT inhaler Inhale 2 puffs into the lungs 2 (two) times daily. 12/08/21   [provider]  Vitamin D, Ergocalciferol, (DRISDOL) 1.25 MG (50000 UNIT) CAPS capsule Take 50,000 Units by mouth once a week. 11/29/21   [provider]  vitamin E 1000 UNIT capsule Take 1,000 Units by mouth daily.    [provider]      Allergies    Codeine    Review of Systems   Review of Systems  See HPI  Physical Exam Updated Vital Signs BP (!) 153/79 (BP Location: Right Arm)   Pulse  66   Temp 97.9 F (36.6 C) (Oral)   Resp 16   Ht '5\' 5"'$  (1.651 m)   Wt 120.2 kg   SpO2 99%   BMI 44.10 kg/m  Physical Exam Vitals and nursing note reviewed.  Constitutional:      General: She is not in acute distress.    Appearance: She is well-developed.  HENT:     Head: Normocephalic and atraumatic.  Eyes:     Conjunctiva/sclera: Conjunctivae normal.  Cardiovascular:     Rate and Rhythm: Normal rate and regular rhythm.     Heart sounds: No murmur heard. Pulmonary:     Effort: Pulmonary effort is normal. No respiratory distress.     Breath sounds: Normal breath sounds.  Abdominal:     Palpations: Abdomen is soft.      Tenderness: There is no abdominal tenderness.  Musculoskeletal:     Cervical back: Neck supple.     Comments: Symmetric, 2+ bilateral lower extremity edema.  2+ DP PT pulses with intact sensation.  Mild erythema from the left ankle to below the midshin without associated bruising, signs of trauma, or drainage.  Skin:    General: Skin is warm and dry.     Capillary Refill: Capillary refill takes less than 2 seconds.  Neurological:     Mental Status: She is alert.  Psychiatric:        Mood and Affect: Mood normal.     ED Results / Procedures / Treatments   Labs (all labs ordered are listed, but only abnormal results are displayed) Labs Reviewed - No data to display  EKG None  Radiology VAS [place] LOWER EXTREMITY VENOUS (DVT) (ONLY [facility] & [facility])  Result Date: 04/17/2022  Lower Venous DVT Study Patient Name:  Bianca Medina  Date of Exam:   04/17/2022 Medical Rec #: [id number]       Accession #:    [id number] Date of Birth: 1951-05-13       Patient Gender: F Patient Age:   24 years Exam Location:  [facility] Procedure:      VAS [place] LOWER EXTREMITY VENOUS (DVT) Referring Phys: BRITNI HENDERLY --------------------------------------------------------------------------------  Indications: Edema.  Risk Factors: None identified. Limitations: Body habitus and poor ultrasound/tissue interface. Comparison Study: No prior studies. Performing Technologist: Oliver Hum RVT  Examination Guidelines: A complete evaluation includes B-mode imaging, spectral Doppler, color Doppler, and power Doppler as needed of all accessible portions of each vessel. Bilateral testing is considered an integral part of a complete examination. Limited examinations for reoccurring indications may be performed as noted. The reflux portion of the exam is performed with the patient in reverse Trendelenburg.  +-----+---------------+---------+-----------+----------+--------------+  RIGHTCompressibilityPhasicitySpontaneityPropertiesThrombus Aging +-----+---------------+---------+-----------+----------+--------------+ CFV  Full           Yes      Yes                                 +-----+---------------+---------+-----------+----------+--------------+   +---------+---------------+---------+-----------+----------+--------------+ LEFT     CompressibilityPhasicitySpontaneityPropertiesThrombus Aging +---------+---------------+---------+-----------+----------+--------------+ CFV      Full           Yes      Yes                                 +---------+---------------+---------+-----------+----------+--------------+ SFJ      Full                                                        +---------+---------------+---------+-----------+----------+--------------+  FV Prox  Full                                                        +---------+---------------+---------+-----------+----------+--------------+ FV Mid   Full                                                        +---------+---------------+---------+-----------+----------+--------------+ FV DistalFull                                                        +---------+---------------+---------+-----------+----------+--------------+ PFV      Full                                                        +---------+---------------+---------+-----------+----------+--------------+ POP      Full           Yes      Yes                                 +---------+---------------+---------+-----------+----------+--------------+ PTV      Full                                                        +---------+---------------+---------+-----------+----------+--------------+ PERO     Full                                                        +---------+---------------+---------+-----------+----------+--------------+    Summary: RIGHT: - No evidence of common femoral vein  obstruction.  LEFT: - There is no evidence of deep vein thrombosis in the lower extremity.  - No cystic structure found in the popliteal fossa.  *See table(s) above for measurements and observations.    Preliminary    DG Tibia/Fibula Left  Result Date: 04/17/2022 CLINICAL DATA:  Left leg pain for 1 week, swelling EXAM: LEFT TIBIA AND FIBULA - 2 VIEW; LEFT ANKLE COMPLETE - 3+ VIEW COMPARISON:  None Available. FINDINGS: Left ankle: Frontal, oblique, and lateral views are obtained. No fracture, subluxation, or dislocation. Joint spaces are well preserved. There is mild diffuse subcutaneous edema. Left tibia/fibula: Frontal and lateral views of the left tibia and fibula are obtained. There are no acute displaced fractures. Alignment of the left knee and ankle is anatomic. Mild medial compartmental osteoarthritis left knee. There is diffuse subcutaneous edema throughout the left lower extremity. IMPRESSION: 1. Diffuse subcutaneous edema throughout the left lower leg below the knee. 2. Mild left  knee osteoarthritis. 3. No acute fracture. Electronically Signed   By: Randa Ngo M.D.   On: 04/17/2022 16:48   DG Ankle Complete Left  Result Date: 04/17/2022 CLINICAL DATA:  Left leg pain for 1 week, swelling EXAM: LEFT TIBIA AND FIBULA - 2 VIEW; LEFT ANKLE COMPLETE - 3+ VIEW COMPARISON:  None Available. FINDINGS: Left ankle: Frontal, oblique, and lateral views are obtained. No fracture, subluxation, or dislocation. Joint spaces are well preserved. There is mild diffuse subcutaneous edema. Left tibia/fibula: Frontal and lateral views of the left tibia and fibula are obtained. There are no acute displaced fractures. Alignment of the left knee and ankle is anatomic. Mild medial compartmental osteoarthritis left knee. There is diffuse subcutaneous edema throughout the left lower extremity. IMPRESSION: 1. Diffuse subcutaneous edema throughout the left lower leg below the knee. 2. Mild left knee osteoarthritis. 3. No  acute fracture. Electronically Signed   By: Randa Ngo M.D.   On: 04/17/2022 16:48    Procedures Procedures    Medications Ordered in ED Medications  cephALEXin (KEFLEX) capsule 500 mg (500 mg Oral Given 04/17/22 2008)    ED Course/ Medical Decision Making/ A&P                           Medical Decision Making  The patient is a 71 year old female with past medical history of HTN, HLD, GERD, DM, schizoaffective disorder presenting for evaluation of left leg pain for the past week.  The differential diagnosis considered includes: DVT, cellulitis, fracture, dislocation, venous insufficiency, aneurysm, abscess, trauma.  On initial evaluation, the patient was hemodynamically stable and afebrile.  Her physical exam was significant for symmetric bilateral lower extremity swelling with mild erythema of the left lower extremity from the ankle to the lower half of the mid shin.  There is no signs of bruising in the left lower extremity is nontender to palpation.  The patient's diagnostic work-up include x-rays of the left tibia/fibula and left ankle which showed diffuse edema without signs of fracture, dislocation, or free air.  The patient also received a vascular study which showed no signs of DVT or popliteal aneurysm.  Based on the patient's history, physical exam, and diagnostic imaging cellulitis versus venous insufficiency is favored as likely source of the patient's symptoms.  While trauma, fracture, dislocation, and DVT were considered were considered, they were determined to be less likely based on the patient's physical exam and diagnostic imaging.  The patient was discharged with instructions to begin taking her Keflex as prescribed and follow-up with her PCP in 24-48 hours for reevaluation.  Amount and/or Complexity of Data Reviewed External Data Reviewed: labs and notes. Labs: ordered. Decision-making details documented in ED Course.  Risk Prescription drug  management.   Patient's presentation is most consistent with acute complicated illness / injury requiring diagnostic workup.         Final Clinical Impression(s) / ED Diagnoses Final diagnoses:  Left leg pain  Cellulitis of left lower extremity    Rx / DC Orders ED Discharge Orders     None         Dani Gobble, MD 04/17/22 2318    Gareth Morgan, MD 04/18/22 1353

## 2022-04-17 NOTE — ED Triage Notes (Signed)
Pt arrived POV from home c/o left leg pain x1 week. Pt states certain parts of her legs are swelling and bulging out and then she has an indentation in her left knee. Pt states the pain got worse last night.

## 2022-04-17 NOTE — Discharge Instructions (Signed)
Please take the prescription for Keflex that you are prescribed at urgent care yesterday and follow-up with your PCP in 24 to 48 hours for reevaluation.  Please return to the emergency department if you develop fevers, numbness, or increased pain in the left lower extremity.

## 2022-04-20 ENCOUNTER — Telehealth: Payer: Self-pay | Admitting: Family Medicine

## 2022-04-20 NOTE — Telephone Encounter (Signed)
Caller Name: Dovey Fatzinger Call back phone #: [phone]  Reason for Call: Bianca Medina to urgent care over the weekend for leg. Please call and speak with pt, wants to know if she needs an appt with Dr. Ethelene Hal.

## 2022-04-20 NOTE — Telephone Encounter (Signed)
Patient scheduled for OV 

## 2022-04-20 NOTE — Telephone Encounter (Signed)
Called patient to check and see how she was doing with swelling in legs, no answer unable to LM due to mailbox full. Will call back.

## 2022-04-21 ENCOUNTER — Telehealth (HOSPITAL_COMMUNITY): Payer: Self-pay | Admitting: Emergency Medicine

## 2022-04-21 MED ORDER — CEPHALEXIN 500 MG PO CAPS: 500.0000 mg | ORAL_CAPSULE | Freq: Three times a day (TID) | ORAL | 0 refills | Status: DC

## 2022-04-23 ENCOUNTER — Ambulatory Visit (INDEPENDENT_AMBULATORY_CARE_PROVIDER_SITE_OTHER): Payer: Medicare PPO | Admitting: Family Medicine

## 2022-04-23 ENCOUNTER — Encounter: Payer: Self-pay | Admitting: Family Medicine

## 2022-04-23 VITALS — BP 128/72 | HR 61 | Temp 97.7°F | Ht 65.0 in | Wt 267.2 lb

## 2022-04-23 DIAGNOSIS — E7800 Pure hypercholesterolemia, unspecified: Secondary | ICD-10-CM

## 2022-04-23 DIAGNOSIS — I10 Essential (primary) hypertension: Secondary | ICD-10-CM

## 2022-04-23 DIAGNOSIS — R7303 Prediabetes: Secondary | ICD-10-CM

## 2022-04-23 LAB — HEMOGLOBIN A1C: Hgb A1c MFr Bld: 7.5 % — ABNORMAL HIGH (ref 4.6–6.5)

## 2022-04-23 LAB — MICROALBUMIN / CREATININE URINE RATIO
Creatinine,U: 158.5 mg/dL
Microalb Creat Ratio: 0.4 mg/g (ref 0.0–30.0)
Microalb, Ur: <0.7 mg/dL (ref 0.0–1.9)

## 2022-04-23 MED ORDER — METFORMIN HCL ER 500 MG PO TB24: ORAL_TABLET | ORAL | 1 refills | Status: DC

## 2022-04-23 MED ORDER — ATORVASTATIN CALCIUM 10 MG PO TABS: 10.0000 mg | ORAL_TABLET | Freq: Every day | ORAL | 1 refills | Status: DC

## 2022-04-23 NOTE — Progress Notes (Signed)
Established Patient Office Visit   Subjective:  Patient ID: Bianca Medina, female    DOB: 10-05-1950  Age: 71 y.o. MRN: [id number]  Chief Complaint  Patient presents with  . Leg Swelling    Swelling in left leg x     HPI Encounter Diagnoses  Name Primary?  . Pre-diabetes Yes  . Elevated cholesterol   . Essential hypertension    For follow-up of ER visit for cellulitis of left leg.  Cellulitis apparently has responded to the cephalexin.  She has 3 more days left.  There is decreased swelling formation and pain.  She was asked to hold her metformin and Lasix.  She had been given a brief course of Lasix at urgent care for the swelling in her leg..  Continues losartan 25 chlorthalidone 25 mg controlled hypertension.  Patient has been taking the atorvastatin intermittently.   Review of Systems  Constitutional: Negative.   HENT: Negative.    Eyes:  Negative for blurred vision, discharge and redness.  Respiratory: Negative.    Cardiovascular: Negative.   Gastrointestinal:  Negative for abdominal pain.  Genitourinary: Negative.   Musculoskeletal: Negative.  Negative for myalgias.  Skin:  Negative for rash.  Neurological:  Negative for tingling, loss of consciousness and weakness.  Endo/Heme/Allergies:  Negative for polydipsia.     Current Outpatient Medications:  .  aspirin EC 81 MG tablet, Take 81 mg by mouth daily. Swallow whole., Disp: , Rfl:  .  Biotin 5000 MCG CAPS, Take 5,000 mcg by mouth daily. , Disp: , Rfl:  .  cephALEXin (KEFLEX) 500 MG capsule, Take 1 capsule (500 mg total) by mouth 3 (three) times daily for 5 days., Disp: 15 capsule, Rfl: 0 .  chlorthalidone (HYGROTON) 25 MG tablet, TAKE 1 TABLET (25 MG TOTAL) BY MOUTH DAILY., Disp: 90 tablet, Rfl: 1 .  esomeprazole (NEXIUM) 40 MG capsule, Take 40 mg by mouth daily. , Disp: , Rfl:  .  fluticasone (FLONASE) 50 MCG/ACT nasal spray, Place 2 sprays into both nostrils daily., Disp: 16 g, Rfl: 6 .  ibuprofen (ADVIL) 600 MG  tablet, Take 1 tablet (600 mg total) by mouth every 6 (six) hours as needed., Disp: 30 tablet, Rfl: 0 .  losartan (COZAAR) 25 MG tablet, Take 25 mg by mouth daily., Disp: , Rfl:  .  meloxicam (MOBIC) 15 MG tablet, Take 15 mg by mouth daily., Disp: , Rfl:  .  montelukast (SINGULAIR) 10 MG tablet, Take 10 mg by mouth daily., Disp: , Rfl:  .  Potassium Chloride ER 20 MEQ TBCR, Take 1 tablet by mouth daily., Disp: , Rfl:  .  PROAIR HFA 108 (90 Base) MCG/ACT inhaler, Inhale 1-2 puffs into the lungs every 4 (four) hours as needed for wheezing or shortness of breath. , Disp: , Rfl:  .  SYMBICORT 80-4.5 MCG/ACT inhaler, Inhale 2 puffs into the lungs 2 (two) times daily., Disp: , Rfl:  .  Vitamin D, Ergocalciferol, (DRISDOL) 1.25 MG (50000 UNIT) CAPS capsule, Take 50,000 Units by mouth once a week., Disp: , Rfl:  .  vitamin E 1000 UNIT capsule, Take 1,000 Units by mouth daily., Disp: , Rfl:  .  atorvastatin (LIPITOR) 10 MG tablet, Take 1 tablet (10 mg total) by mouth daily., Disp: 90 tablet, Rfl: 1 .  Casanthranol-Docusate Sodium (LAXATIVE PLUS STOOL SOFTENER PO), Take by mouth. (Patient not taking: Reported on 04/23/2022), Disp: , Rfl:  .  metFORMIN (GLUCOPHAGE-XR) 500 MG 24 hr tablet, TAKE 1 TABLET BY MOUTH  EVERY DAY WITH BREAKFAST, Disp: 90 tablet, Rfl: 1   Objective:     BP 128/72 (BP Location: Left Arm, Patient Position: Sitting, Cuff Size: Large)   Pulse 61   Temp 97.7 F (36.5 C) (Temporal)   Ht '5\' 5"'$  (1.651 m)   Wt 267 lb 3.2 oz (121.2 kg)   SpO2 94%   BMI 44.46 kg/m    Physical Exam Constitutional:      General: She is not in acute distress.    Appearance: Normal appearance. She is not ill-appearing, toxic-appearing or diaphoretic.  HENT:     Head: Normocephalic and atraumatic.     Right Ear: External ear normal.     Left Ear: External ear normal.  Eyes:     General: No scleral icterus.       Right eye: No discharge.        Left eye: No discharge.     Extraocular Movements:  Extraocular movements intact.     Conjunctiva/sclera: Conjunctivae normal.  Cardiovascular:     Rate and Rhythm: Normal rate and regular rhythm.  Pulmonary:     Effort: Pulmonary effort is normal. No respiratory distress.     Breath sounds: Normal breath sounds.  Musculoskeletal:     Cervical back: No rigidity or tenderness.       Legs:  Skin:    General: Skin is warm and dry.  Neurological:     Mental Status: She is alert and oriented to person, place, and time.  Psychiatric:        Mood and Affect: Mood normal.        Behavior: Behavior normal.     No results found for any visits on 04/23/22.    The 10-year ASCVD risk score (Arnett DK, et al., 2019) is: 21.8%    Assessment & Plan:   Pre-diabetes -     Hemoglobin A1c -     metFORMIN HCl ER; TAKE 1 TABLET BY MOUTH EVERY DAY WITH BREAKFAST  Dispense: 90 tablet; Refill: 1 -     Microalbumin / creatinine urine ratio  Elevated cholesterol -     Atorvastatin Calcium; Take 1 tablet (10 mg total) by mouth daily.  Dispense: 90 tablet; Refill: 1  Essential hypertension    Return in about 6 months (around 10/23/2022), or if symptoms worsen or fail to improve.  Restart metformin and atorvastatin.  Discussed her increased risk for vascular disease.  Complete cephalexin course of therapy.   Libby Maw, MD

## 2022-04-24 ENCOUNTER — Ambulatory Visit: Payer: Medicare PPO | Admitting: Obstetrics & Gynecology

## 2022-04-24 ENCOUNTER — Encounter: Payer: Self-pay | Admitting: Obstetrics & Gynecology

## 2022-04-24 VITALS — BP 110/70 | HR 65 | Resp 97 | Wt 264.0 lb

## 2022-04-24 DIAGNOSIS — Z90722 Acquired absence of ovaries, bilateral: Secondary | ICD-10-CM | POA: Diagnosis not present

## 2022-04-24 DIAGNOSIS — F22 Delusional disorders: Secondary | ICD-10-CM

## 2022-04-24 DIAGNOSIS — Z853 Personal history of malignant neoplasm of breast: Secondary | ICD-10-CM | POA: Diagnosis not present

## 2022-04-24 DIAGNOSIS — Z90710 Acquired absence of both cervix and uterus: Secondary | ICD-10-CM | POA: Diagnosis not present

## 2022-04-24 DIAGNOSIS — Z9079 Acquired absence of other genital organ(s): Secondary | ICD-10-CM

## 2022-04-24 NOTE — Progress Notes (Signed)
    Bianca Medina 09-23-1950 [id number]        71 y.o.  G3P3L3  RP: Patient thinks that an implant was inserted in her abdomen giving her vaginal sensations  HPI: Patient thinks that an implant was inserted in her abdomen.  She reports feeling it move in her abdomen and giving her vaginal sensations.  She thinks that it was done to retaliate against her.  Dx of Rt breast Ca in 12/2021.  It remains untreated as patient reports not finding a [employer] officer, [employer].   OB History  Gravida Para Term Preterm AB Living  '3 3       3  '$ SAB IAB Ectopic Multiple Live Births               # Outcome Date GA Lbr Len/2nd Weight Sex Delivery Anes PTL Lv  3 Para           2 Para           1 Para             Past medical history,surgical history, problem list, medications, allergies, family history and social history were all reviewed and documented in the EPIC chart.   Directed ROS with pertinent positives and negatives documented in the history of present illness/assessment and plan.  Exam:  Vitals:   04/24/22 1454  BP: 110/70  Pulse: 65  Resp: (!) 97  Weight: 264 lb (119.7 kg)   General appearance:  Normal  Abdomen: Normal  Gynecologic exam: Vulva normal.  Bimanual exam:  Vagina normal.  No lesion, no prolapse.  No pelvic mass, NT.   Assessment/Plan:  71 y.o. G3P3   1. Delusional disorder [facility]) Patient thinks that someone put an "implant" in her body that is moving in her abdomen and giving her vaginal sensations.  She thinks that it was done to retaliate against her.  Patient appears delusional/psychotic.  Refer to Psychiatry.  2. Right breast cancer Dx of Rt Breast invasive Ca untreated as patient report not finding a Research officer, trade union.  Refer to [facility].  3. S/P TAH-BSO   Patient says that she wants to find a new Gynecologist.  If patient doesn't follow above referral recommendations, please discharge patient from my care.  Princess Bruins MD, 3:08 PM  04/24/2022

## 2022-04-26 ENCOUNTER — Observation Stay (HOSPITAL_COMMUNITY)
Admission: EM | Admit: 2022-04-26 | Discharge: 2022-04-27 | Disposition: A | Discharge status: Home / Self Care | Payer: Medicare PPO | Attending: Internal Medicine | Admitting: Internal Medicine

## 2022-04-26 ENCOUNTER — Emergency Department (HOSPITAL_COMMUNITY): Payer: Medicare PPO

## 2022-04-26 DIAGNOSIS — D72829 Elevated white blood cell count, unspecified: Secondary | ICD-10-CM | POA: Diagnosis not present

## 2022-04-26 DIAGNOSIS — I10 Essential (primary) hypertension: Secondary | ICD-10-CM | POA: Diagnosis not present

## 2022-04-26 DIAGNOSIS — E119 Type 2 diabetes mellitus without complications: Secondary | ICD-10-CM | POA: Diagnosis not present

## 2022-04-26 DIAGNOSIS — L0390 Cellulitis, unspecified: Secondary | ICD-10-CM | POA: Diagnosis present

## 2022-04-26 DIAGNOSIS — L03116 Cellulitis of left lower limb: Secondary | ICD-10-CM | POA: Diagnosis not present

## 2022-04-26 DIAGNOSIS — D0511 Intraductal carcinoma in situ of right breast: Secondary | ICD-10-CM | POA: Insufficient documentation

## 2022-04-26 DIAGNOSIS — K5732 Diverticulitis of large intestine without perforation or abscess without bleeding: Secondary | ICD-10-CM | POA: Diagnosis not present

## 2022-04-26 DIAGNOSIS — Z7984 Long term (current) use of oral hypoglycemic drugs: Secondary | ICD-10-CM | POA: Diagnosis not present

## 2022-04-26 DIAGNOSIS — Z6841 Body Mass Index (BMI) 40.0 and over, adult: Secondary | ICD-10-CM

## 2022-04-26 DIAGNOSIS — K5792 Diverticulitis of intestine, part unspecified, without perforation or abscess without bleeding: Principal | ICD-10-CM

## 2022-04-26 DIAGNOSIS — J45909 Unspecified asthma, uncomplicated: Secondary | ICD-10-CM | POA: Diagnosis not present

## 2022-04-26 DIAGNOSIS — E785 Hyperlipidemia, unspecified: Secondary | ICD-10-CM | POA: Diagnosis not present

## 2022-04-26 DIAGNOSIS — R1030 Lower abdominal pain, unspecified: Secondary | ICD-10-CM | POA: Diagnosis not present

## 2022-04-26 DIAGNOSIS — C50919 Malignant neoplasm of unspecified site of unspecified female breast: Secondary | ICD-10-CM | POA: Diagnosis present

## 2022-04-26 DIAGNOSIS — Z7982 Long term (current) use of aspirin: Secondary | ICD-10-CM | POA: Diagnosis not present

## 2022-04-26 DIAGNOSIS — Z79899 Other long term (current) drug therapy: Secondary | ICD-10-CM | POA: Insufficient documentation

## 2022-04-26 DIAGNOSIS — E118 Type 2 diabetes mellitus with unspecified complications: Secondary | ICD-10-CM | POA: Diagnosis not present

## 2022-04-26 DIAGNOSIS — C50911 Malignant neoplasm of unspecified site of right female breast: Secondary | ICD-10-CM

## 2022-04-26 LAB — GLUCOSE, CAPILLARY
Glucose-Capillary: 149 mg/dL — ABNORMAL HIGH (ref 70–99)
Glucose-Capillary: 115 mg/dL — ABNORMAL HIGH (ref 70–99)

## 2022-04-26 LAB — URINALYSIS, ROUTINE W REFLEX MICROSCOPIC
Bilirubin Urine: NEGATIVE
Glucose, UA: NEGATIVE mg/dL
Hgb urine dipstick: NEGATIVE
Ketones, ur: NEGATIVE mg/dL
Leukocytes,Ua: NEGATIVE
Nitrite: NEGATIVE
Protein, ur: NEGATIVE mg/dL
Specific Gravity, Urine: >1.046 — ABNORMAL HIGH (ref 1.005–1.030)
pH: 5 (ref 5.0–8.0)

## 2022-04-26 LAB — LIPASE, BLOOD: Lipase: 24 U/L (ref 11–51)

## 2022-04-26 LAB — COMPREHENSIVE METABOLIC PANEL
ALT: 16 U/L (ref 0–44)
AST: 19 U/L (ref 15–41)
Albumin: 3.3 g/dL — ABNORMAL LOW (ref 3.5–5.0)
Alkaline Phosphatase: 94 U/L (ref 38–126)
Anion gap: 10 (ref 5–15)
BUN: 15 mg/dL (ref 8–23)
CO2: 28 mmol/L (ref 22–32)
Calcium: 9.1 mg/dL (ref 8.9–10.3)
Chloride: 100 mmol/L (ref 98–111)
Creatinine, Ser: 0.99 mg/dL (ref 0.44–1.00)
GFR, Estimated: >60 mL/min (ref >60)
Glucose, Bld: 187 mg/dL — ABNORMAL HIGH (ref 70–99)
Potassium: 4 mmol/L (ref 3.5–5.1)
Sodium: 138 mmol/L (ref 135–145)
Total Bilirubin: 0.8 mg/dL (ref 0.3–1.2)
Total Protein: 7.3 g/dL (ref 6.5–8.1)

## 2022-04-26 LAB — CBC
HCT: 39.7 % (ref 36.0–46.0)
Hemoglobin: 13.4 g/dL (ref 12.0–15.0)
MCH: 25.3 pg — ABNORMAL LOW (ref 26.0–34.0)
MCHC: 33.8 g/dL (ref 30.0–36.0)
MCV: 74.9 fL — ABNORMAL LOW (ref 80.0–100.0)
Platelets: 290 10*3/uL (ref 150–400)
RBC: 5.3 MIL/uL — ABNORMAL HIGH (ref 3.87–5.11)
RDW: 15.2 % (ref 11.5–15.5)
WBC: 16.1 10*3/uL — ABNORMAL HIGH (ref 4.0–10.5)
nRBC: 0 % (ref 0.0–0.2)

## 2022-04-26 MED ORDER — CHLORTHALIDONE 25 MG PO TABS
25.0000 mg | ORAL_TABLET | Freq: Every day | ORAL | Status: DC
  Filled 2022-04-26: qty 1

## 2022-04-26 MED ORDER — IOHEXOL 350 MG/ML SOLN
75.0000 mL | Freq: Once | INTRAVENOUS | Status: AC | PRN
  Administered 2022-04-26: 75 mL via INTRAVENOUS

## 2022-04-26 MED ORDER — LOSARTAN POTASSIUM 50 MG PO TABS
25.0000 mg | ORAL_TABLET | Freq: Every day | ORAL | Status: DC
  Administered 2022-04-27: 25 mg via ORAL
  Filled 2022-04-26: qty 1

## 2022-04-26 MED ORDER — SODIUM CHLORIDE 0.9 % IV SOLN: 2.0000 g | INTRAVENOUS | Status: DC

## 2022-04-26 MED ORDER — ASPIRIN 81 MG PO TBEC
81.0000 mg | DELAYED_RELEASE_TABLET | Freq: Every day | ORAL | Status: DC
  Administered 2022-04-26 – 2022-04-27 (×2): 81 mg via ORAL
  Filled 2022-04-26 (×2): qty 1

## 2022-04-26 MED ORDER — SODIUM CHLORIDE 0.9 % IV BOLUS: 1000.0000 mL | Freq: Once | INTRAVENOUS | Status: DC

## 2022-04-26 MED ORDER — FENTANYL CITRATE PF 50 MCG/ML IJ SOSY: 25.0000 ug | PREFILLED_SYRINGE | INTRAMUSCULAR | Status: DC | PRN

## 2022-04-26 MED ORDER — ALBUTEROL SULFATE (2.5 MG/3ML) 0.083% IN NEBU: 2.5000 mg | INHALATION_SOLUTION | Freq: Four times a day (QID) | RESPIRATORY_TRACT | Status: DC | PRN

## 2022-04-26 MED ORDER — METRONIDAZOLE 500 MG/100ML IV SOLN
500.0000 mg | Freq: Two times a day (BID) | INTRAVENOUS | Status: DC
  Administered 2022-04-26 – 2022-04-27 (×2): 500 mg via INTRAVENOUS
  Filled 2022-04-26 (×2): qty 100

## 2022-04-26 MED ORDER — SODIUM CHLORIDE 0.9% FLUSH
3.0000 mL | Freq: Two times a day (BID) | INTRAVENOUS | Status: DC
  Administered 2022-04-26 – 2022-04-27 (×3): 3 mL via INTRAVENOUS

## 2022-04-26 MED ORDER — INSULIN ASPART 100 UNIT/ML IJ SOLN
0.0000 [IU] | Freq: Three times a day (TID) | INTRAMUSCULAR | Status: DC
  Administered 2022-04-26 – 2022-04-27 (×2): 1 [IU] via SUBCUTANEOUS

## 2022-04-26 MED ORDER — MONTELUKAST SODIUM 10 MG PO TABS
10.0000 mg | ORAL_TABLET | Freq: Every day | ORAL | Status: DC
  Administered 2022-04-26 – 2022-04-27 (×2): 10 mg via ORAL
  Filled 2022-04-26 (×2): qty 1

## 2022-04-26 MED ORDER — MOMETASONE FURO-FORMOTEROL FUM 100-5 MCG/ACT IN AERO
2.0000 | INHALATION_SPRAY | Freq: Two times a day (BID) | RESPIRATORY_TRACT | Status: DC
  Administered 2022-04-26 – 2022-04-27 (×2): 2 via RESPIRATORY_TRACT
  Filled 2022-04-26: qty 8.8

## 2022-04-26 MED ORDER — ENOXAPARIN SODIUM 40 MG/0.4ML IJ SOSY: 40.0000 mg | PREFILLED_SYRINGE | INTRAMUSCULAR | Status: DC

## 2022-04-26 MED ORDER — SODIUM CHLORIDE 0.9 % IV SOLN
2.0000 g | Freq: Once | INTRAVENOUS | Status: AC
  Administered 2022-04-26: 2 g via INTRAVENOUS
  Filled 2022-04-26: qty 20

## 2022-04-26 MED ORDER — INSULIN ASPART 100 UNIT/ML IJ SOLN: 0.0000 [IU] | Freq: Every day | INTRAMUSCULAR | Status: DC

## 2022-04-26 MED ORDER — HYDROCODONE-ACETAMINOPHEN 5-325 MG PO TABS: 1.0000 | ORAL_TABLET | Freq: Four times a day (QID) | ORAL | Status: DC | PRN

## 2022-04-26 MED ORDER — ACETAMINOPHEN 325 MG PO TABS: 650.0000 mg | ORAL_TABLET | Freq: Four times a day (QID) | ORAL | Status: DC | PRN

## 2022-04-26 MED ORDER — ATORVASTATIN CALCIUM 10 MG PO TABS
10.0000 mg | ORAL_TABLET | Freq: Every day | ORAL | Status: DC
  Administered 2022-04-26 – 2022-04-27 (×2): 10 mg via ORAL
  Filled 2022-04-26 (×2): qty 1

## 2022-04-26 MED ORDER — SODIUM CHLORIDE 0.9 % IV SOLN: INTRAVENOUS | Status: DC

## 2022-04-26 MED ORDER — ACETAMINOPHEN 650 MG RE SUPP: 650.0000 mg | Freq: Four times a day (QID) | RECTAL | Status: DC | PRN

## 2022-04-26 MED ORDER — METRONIDAZOLE 500 MG/100ML IV SOLN
500.0000 mg | Freq: Once | INTRAVENOUS | Status: AC
  Administered 2022-04-26: 500 mg via INTRAVENOUS
  Filled 2022-04-26: qty 100

## 2022-04-26 NOTE — ED Notes (Signed)
Patient leaving the floor in stable condition, AOX4, with staff and her belongings.

## 2022-04-26 NOTE — ED Provider Triage Note (Signed)
Emergency Medicine Provider Triage Evaluation Note  Bianca Medina , a 71 y.o. female  was evaluated in triage.  Pt complains of diffuse abdominal pain for the past few days.  Please see gynecology note on 12/8 for further information.  No nausea, vomiting, or diarrhea.  Denies fever however, admits to chills.  No urinary symptoms.  She has not been sexually active in numerous years.  Review of Systems  Positive: Abdominal pain Negative: fever  Physical Exam  BP (!) 165/69 (BP Location: Right Arm)   Pulse (!) 101   Temp 99.5 F (37.5 C)   Resp 20   SpO2 95%  Gen:   Awake, no distress   Resp:  Normal effort  MSK:   Moves extremities without difficulty  Other:  Diffuse tenderness  Medical Decision Making  Medically screening exam initiated at 8:38 AM.  Appropriate orders placed.  KIERSTAN AUER was informed that the remainder of the evaluation will be completed by another provider, this initial triage assessment does not replace that evaluation, and the importance of remaining in the ED until their evaluation is complete.  Abdominal labs Delusion disorder? However patient has untreated breast cancer, will obtain CT abdomen to rule out METs   Suzy Bouchard, PA-C 04/26/22 0840

## 2022-04-26 NOTE — H&P (Signed)
History and Physical    Patient: Bianca Medina ION:[id number] DOB: Feb 22, 1951 DOA: 04/26/2022 DOS: the patient was seen and examined on 04/26/2022 PCP: Libby Maw, MD  Patient coming from: Home  Chief Complaint:  Chief Complaint  Patient presents with   Abdominal Pain   HPI: Bianca Medina is a 71 y.o. female with medical history significant of hypertension, hyperlipidemia, DM type II,  asthma, right breast cancer, anemia, colon polyps, and GERD who presents with complaints of lower abdominal pain.  Symptoms started after she went to the OB/GYN and they had done a pelvic exam 2 days ago.  Describes pain as like she is having contractions and notes that it feels like she has to have a bowel movement but cannot.  Patient's last bowel movement was 3 days ago.  She has been having some nausea and chills.  Denies any vomiting or diarrhea symptoms.   She had been on antibiotics of Keflex for concern for a left lower extremity cellulitis on 12/5 to take ~2/10.  Records note patient had just recently had a colonoscopy in July of this year due to bleeding.  Reports that she was told that they did not find any polyps and that she needed to follow-up with a repeat exam in 5 years.  Patient reports that she is in the process of obtaining a second opinion in regards to the diagnosis of invasive ductal neoplasm of the right breast that was HER2 negative, 95% ER positive, and 90% PR positive with a K-67 of 5%.  Initially diagnosed in August of this year.  Bianca Medina had been scheduled for right mastectomy with Dr. Coralie Keens here at [facility], but patient states that the doctor performing the procedure was not appropriately certified.    Upon admission into the emergency department patient was noted to be afebrile with relatively stable vital signs. Labs significant for WBC 16.1 and glucose 187.  CT scan of the abdomen and pelvis noted acute diverticulitis involving the distal descending  colon without signs of perforation or abscess.  Patient has been started on empiric antibiotics of metronidazole and Rocephin.  [facility] called to admit.  Review of Systems: As mentioned in the history of present illness. All other systems reviewed and are negative. Past Medical History:  Diagnosis Date   Adenomatous colon polyp 2021   Anemia 2003   IRON DEFICIENCY    Asthma    Breast cancer ([place])    right breast   Chronic anal fissure    Common migraine 06/23/2019   Diabetes mellitus without complication (HCC)    GERD (gastroesophageal reflux disease)    Hearing loss    Hyperlipidemia, mild    Hypertension    IDA (iron deficiency anemia)    NSVD (normal spontaneous vaginal delivery)    X3   Obesity    PMDD (premenstrual dysphoric disorder)    Sensorineural hearing loss (SNHL) of both ears    Sleep disorder    Past Surgical History:  Procedure Laterality Date   ABDOMINAL HYSTERECTOMY     BLADDER REPAIR     BREAST BIOPSY Right    2023   COLONOSCOPY  11/2019   1 hpp and 1 tubl adenoma   COLONOSCOPY WITH PROPOFOL N/A 12/03/2021   Procedure: COLONOSCOPY WITH PROPOFOL;  Surgeon: Robert Bellow, MD;  Location: [facility];  Service: Endoscopy;  Laterality: N/A;   GUM SURGERY  1980's   MANDIBLE FRACTURE SURGERY     TUBAL LIGATION  Social History:  reports that she has never smoked. She has never used smokeless tobacco. She reports that she does not currently use alcohol. She reports that she does not use drugs.  Allergies  Allergen Reactions   Codeine Other (See Comments)    nightmares Other reaction(s): Other nightmares nightmares    Family History  Problem Relation Age of Onset   Cancer Mother        LUNG- SMOKER   Diabetes Father    Hypertension Father    Heart disease Father    Stroke Father    Breast cancer Sister        lates 12s   Cancer Paternal Aunt        COLON   Cancer Paternal Uncle        COLON    Prior to Admission medications    Medication Sig Start Date End Date Taking? Authorizing Provider  aspirin EC 81 MG tablet Take 81 mg by mouth daily. Swallow whole.    [provider]  atorvastatin (LIPITOR) 10 MG tablet Take 1 tablet (10 mg total) by mouth daily. 04/23/22   Libby Maw, MD  Biotin 5000 MCG CAPS Take 5,000 mcg by mouth daily.     [provider]  Casanthranol-Docusate Sodium (LAXATIVE PLUS STOOL SOFTENER PO) Take by mouth.    [provider]  cephALEXin (KEFLEX) 500 MG capsule Take 1 capsule (500 mg total) by mouth 3 (three) times daily for 5 days. 04/21/22 04/26/22  Chase Picket, MD  chlorthalidone (HYGROTON) 25 MG tablet TAKE 1 TABLET (25 MG TOTAL) BY MOUTH DAILY. 02/24/22   Libby Maw, MD  esomeprazole (NEXIUM) 40 MG capsule Take 40 mg by mouth daily.     [provider]  fluticasone (FLONASE) 50 MCG/ACT nasal spray Place 2 sprays into both nostrils daily. 05/13/21   Libby Maw, MD  ibuprofen (ADVIL) 600 MG tablet Take 1 tablet (600 mg total) by mouth every 6 (six) hours as needed. 03/18/22   Wilnette Kales, PA  losartan (COZAAR) 25 MG tablet Take 25 mg by mouth daily. 07/23/21   [provider]  meloxicam (MOBIC) 15 MG tablet Take 15 mg by mouth daily. 07/03/21   [provider]  metFORMIN (GLUCOPHAGE-XR) 500 MG 24 hr tablet TAKE 1 TABLET BY MOUTH EVERY DAY WITH BREAKFAST 04/23/22   Libby Maw, MD  montelukast (SINGULAIR) 10 MG tablet Take 10 mg by mouth daily. 12/08/21   [provider]  Potassium Chloride ER 20 MEQ TBCR Take 1 tablet by mouth daily. 11/10/21   [provider]  PROAIR HFA 108 (90 Base) MCG/ACT inhaler Inhale 1-2 puffs into the lungs every 4 (four) hours as needed for wheezing or shortness of breath.  03/16/18   [provider]  SYMBICORT 80-4.5 MCG/ACT inhaler Inhale 2 puffs into the lungs 2 (two) times daily. 12/08/21   [provider]  Vitamin D,  Ergocalciferol, (DRISDOL) 1.25 MG (50000 UNIT) CAPS capsule Take 50,000 Units by mouth once a week. 11/29/21   [provider]  vitamin E 1000 UNIT capsule Take 1,000 Units by mouth daily.    [provider]    Physical Exam: Vitals:   04/26/22 0945 04/26/22 1030 04/26/22 1400 04/26/22 1403  BP: 111/72 127/71 (!) 104/52   Pulse: 70 67 97   Resp:   18   Temp:    98.4 F (36.9 C)  TempSrc:    Oral  SpO2: 97% 97% 98%  Constitutional: Elderly female who appears to be in no acute distress at this time Eyes: PERRL, lids and conjunctivae normal ENMT: Mucous membranes are moist.   Neck: normal, supple,  Respiratory: clear to auscultation bilaterally, no wheezing, no crackles. Normal respiratory effort.  Cardiovascular: Regular rate and rhythm. No extremity edema. 2+ pedal pulses.   Abdomen: Tenderness palpation of the lower abdomen.  Bowel sounds present. Musculoskeletal: no clubbing / cyanosis. No joint deformity upper and lower extremities. Good ROM, no contractures. Normal muscle tone.  Skin: Mild erythema noted of the left lower extremity and Neurologic: CN 2-12 grossly intact. Strength 5/5 in all 4.  Psychiatric: Poor judgment and insight. Alert and oriented x 3. Normal mood.   Data Reviewed:  Reviewed lab, imaging and pertinent records as noted above in the HPI.  Assessment and Plan: Diverticulitis Acute.  Patient presents with complaints of lower abdominal pain after having a pelvic exam 2 days ago.  CT imaging reveals concern for diverticulitis of the descending colon without signs of abscess or perforation.  Patient had just recently had a colonoscopy -Admit to a MedSurg bed -Advance diet as tolerated -Continue empiric antibiotics of Rocephin and metronidazole -Hydrocodone/fentanyl IV as needed for pain -Recommend patient to follow-up in outpatient setting with GI  Cellulitis of the left lower extremity Patient reported having redness and erythema of the  left lower extremity for which the cause is unclear.  She had been recently placed on Keflex. -Antibiotics as noted above  Leukocytosis Acute.  WBC elevated at 16.1.  Suspect secondary to above. -Recheck WBC in a.m.  Invasive ductal carcinoma of the right breast Patient diagnosed with invasive ductal carcinoma of the right breast in August of this year.  Patient had undergone treatment and she reports that the surgeon was not appropriately certified to do the procedure.  Currently reports looking for second opinion. -Recommended patient follow-up with oncology and seek appropriate treatment  Essential hypertension -Continue losartan and chlorthalidone in a.m.  Diabetes mellitus type 2, without long-term use of insulin On admission glucose 185.  Last hemoglobin A1c 7.5 on 04/23/2022.  Patient medication regimen includes metformin 500 mg twice daily -Hypoglycemic protocols -Hold metformin -CBGs before every meal with sensitive SSI  Hyperlipidemia -Continue atorvastatin  Morbid obesity BMI 43.93 kg/m  DVT prophylaxis: Lovenox Advance Care Planning:   Code Status: Full Code   Consults: none   Family Communication:  Severity of Illness: The appropriate patient status for this patient is INPATIENT. Inpatient status is judged to be reasonable and necessary in order to provide the required intensity of service to ensure the patient's safety. The patient's presenting symptoms, physical exam findings, and initial radiographic and laboratory data in the context of their chronic comorbidities is felt to place them at high risk for further clinical deterioration. Furthermore, it is not anticipated that the patient will be medically stable for discharge from the hospital within 2 midnights of admission.   * I certify that at the point of admission it is my clinical judgment that the patient will require inpatient hospital care spanning beyond 2 midnights from the point of admission due to high  intensity of service, high risk for further deterioration and high frequency of surveillance required.*  Author: Norval Morton, MD 04/26/2022 2:14 PM  For on call review [email or web address].

## 2022-04-26 NOTE — ED Provider Notes (Signed)
[facility] EMERGENCY DEPARTMENT Provider Note   CSN: [id number] Arrival date & time: 04/26/22  0831     History  Chief Complaint  Patient presents with   Abdominal Pain    Bianca Medina is a 71 y.o. female who presents to the ED with concerns for lower abdominal pain onset 3 days.  She notes that her abdominal pain started after she had a pelvic exam completed by gynecologist 3 days ago.  She notes "I think she went too deep during the exam." No meds tried prior to arrival.  Patient is not currently receiving treatment for her history of breast cancer.  Patient notes that "I am trying to find a board-certified doctor." Denies urinary symptoms, fever, vomiting, diarrhea.  Patient notes that she has had constipation and took a laxative on 4 days ago with her last bowel movement being 5 days ago.  The history is provided by the patient. No language interpreter was used.      Home Medications Prior to Admission medications   Medication Sig Start Date End Date Taking? Authorizing Provider  aspirin EC 81 MG tablet Take 81 mg by mouth daily. Swallow whole.    [provider]  atorvastatin (LIPITOR) 10 MG tablet Take 1 tablet (10 mg total) by mouth daily. 04/23/22   [name], MD  Biotin 5000 MCG CAPS Take 5,000 mcg by mouth daily.     [provider]  Casanthranol-Docusate Sodium (LAXATIVE PLUS STOOL SOFTENER PO) Take by mouth.    [provider]  cephALEXin (KEFLEX) 500 MG capsule Take 1 capsule (500 mg total) by mouth 3 (three) times daily for 5 days. 04/21/22 04/26/22  Merrilee Jansky, MD  chlorthalidone (HYGROTON) 25 MG tablet TAKE 1 TABLET (25 MG TOTAL) BY MOUTH DAILY. 02/24/22   [name], MD  esomeprazole (NEXIUM) 40 MG capsule Take 40 mg by mouth daily.     [provider]  fluticasone (FLONASE) 50 MCG/ACT nasal spray Place 2 sprays into both nostrils daily. 05/13/21   [name], MD   ibuprofen (ADVIL) 600 MG tablet Take 1 tablet (600 mg total) by mouth every 6 (six) hours as needed. 03/18/22   Peter Garter, PA  losartan (COZAAR) 25 MG tablet Take 25 mg by mouth daily. 07/23/21   [provider]  meloxicam (MOBIC) 15 MG tablet Take 15 mg by mouth daily. 07/03/21   [provider]  metFORMIN (GLUCOPHAGE-XR) 500 MG 24 hr tablet TAKE 1 TABLET BY MOUTH EVERY DAY WITH BREAKFAST 04/23/22   [name], MD  montelukast (SINGULAIR) 10 MG tablet Take 10 mg by mouth daily. 12/08/21   [provider]  Potassium Chloride ER 20 MEQ TBCR Take 1 tablet by mouth daily. 11/10/21   [provider]  PROAIR HFA 108 (90 Base) MCG/ACT inhaler Inhale 1-2 puffs into the lungs every 4 (four) hours as needed for wheezing or shortness of breath.  03/16/18   [provider]  SYMBICORT 80-4.5 MCG/ACT inhaler Inhale 2 puffs into the lungs 2 (two) times daily. 12/08/21   [provider]  Vitamin D, Ergocalciferol, (DRISDOL) 1.25 MG (50000 UNIT) CAPS capsule Take 50,000 Units by mouth once a week. 11/29/21   [provider]  vitamin E 1000 UNIT capsule Take 1,000 Units by mouth daily.    [provider]      Allergies    Codeine    Review of Systems   Review of Systems  Gastrointestinal:  Positive for abdominal pain.  All other systems reviewed and are negative.   Physical Exam Updated Vital Signs BP (!) 165/69 (BP Location: Right Arm)   Pulse (!) 101   Temp 99.5 F (37.5 C)   Resp 20   SpO2 95%  Physical Exam Vitals and nursing note reviewed.  Constitutional:      General: She is not in acute distress.    Appearance: She is not diaphoretic.  HENT:     Head: Normocephalic and atraumatic.     Mouth/Throat:     Pharynx: No oropharyngeal exudate.  Eyes:     General: No scleral icterus.    Conjunctiva/sclera: Conjunctivae normal.  Cardiovascular:     Rate and Rhythm: Normal rate and regular rhythm.     Pulses:  Normal pulses.     Heart sounds: Normal heart sounds.  Pulmonary:     Effort: Pulmonary effort is normal. No respiratory distress.     Breath sounds: Normal breath sounds. No wheezing.  Abdominal:     General: Bowel sounds are normal.     Palpations: Abdomen is soft. There is no mass.     Tenderness: There is abdominal tenderness in the right lower quadrant, suprapubic area and left lower quadrant. There is no guarding or rebound.     Comments: Tenderness to palpation noted to lower abdomen  Musculoskeletal:        General: Normal range of motion.     Cervical back: Normal range of motion and neck supple.  Skin:    General: Skin is warm and dry.  Neurological:     Mental Status: She is alert.  Psychiatric:        Behavior: Behavior normal.     ED Results / Procedures / Treatments   Labs (all labs ordered are listed, but only abnormal results are displayed) Labs Reviewed  COMPREHENSIVE METABOLIC PANEL - Abnormal; Notable for the following components:      Result Value   Glucose, Bld 187 (*)    Albumin 3.3 (*)    All other components within normal limits  CBC - Abnormal; Notable for the following components:   WBC 16.1 (*)    RBC 5.30 (*)    MCV 74.9 (*)    MCH 25.3 (*)    All other components within normal limits  LIPASE, BLOOD  URINALYSIS, ROUTINE W REFLEX MICROSCOPIC    EKG None  Radiology CT ABDOMEN PELVIS W CONTRAST  Result Date: 04/26/2022 CLINICAL DATA:  Left lower quadrant abdominal pain EXAM: CT ABDOMEN AND PELVIS WITH CONTRAST TECHNIQUE: Multidetector CT imaging of the abdomen and pelvis was performed using the standard protocol following bolus administration of intravenous contrast. RADIATION DOSE REDUCTION: This exam was performed according to the departmental dose-optimization program which includes automated exposure control, adjustment of the mA and/or kV according to patient size and/or use of iterative reconstruction technique. CONTRAST:  75mL OMNIPAQUE  IOHEXOL 350 MG/ML SOLN COMPARISON:  09/05/2021 FINDINGS: Lower chest: No pleural fluid or airspace disease. Hepatobiliary: No focal liver abnormality is seen. No gallstones, gallbladder wall thickening, or biliary dilatation. Pancreas: Unremarkable. No pancreatic ductal dilatation or surrounding inflammatory changes. Spleen: Normal in size without focal abnormality. Adrenals/Urinary Tract: Unchanged left adrenal nodule measuring 1.4 cm. This is been stable since 07/25/2018 compatible with a benign adenoma. No follow-up imaging recommended. Normal right adrenal gland. No nephrolithiasis, hydronephrosis or suspicious mass. Urinary bladder appears normal. Stomach/Bowel: Small hiatal hernia. Signs of acute diverticulitis involving the distal descending colon  is identified with focally inflamed diverticula, short segment of colonic wall thickening, and surrounding inflammatory fat stranding. Trace free fluid extends along the left pericolic gutter. No focal fluid collections identified to suggest abscess. The remaining bowel loops are unremarkable. Vascular/Lymphatic: No signs of abdominopelvic adenopathy. Normal appearance of the abdominal aorta. Reproductive: Status post hysterectomy. No adnexal masses. Other: No significant abdominopelvic ascites. No focal fluid collections. No signs of pneumoperitoneum to suggest free perforation Musculoskeletal: No acute or significant osseous findings. IMPRESSION: 1. Acute diverticulitis involving the distal descending colon. No evidence for perforation or abscess. 2. Small hiatal hernia. Electronically Signed   By: Signa Kell M.D.   On: 04/26/2022 11:31    Procedures Procedures    Medications Ordered in ED Medications  cefTRIAXone (ROCEPHIN) 2 g in sodium chloride 0.9 % 100 mL IVPB (2 g Intravenous New Bag/Given 04/26/22 1254)    And  metroNIDAZOLE (FLAGYL) IVPB 500 mg (has no administration in time range)  iohexol (OMNIPAQUE) 350 MG/ML injection 75 mL (75 mLs  Intravenous Contrast Given 04/26/22 1112)    ED Course/ Medical Decision Making/ A&P Clinical Course as of 04/26/22 1424  Sun Apr 26, 2022  1044 Pt re-evaluated and resting comfortably on stretcher. Informed patient of labs. Pt notes that she is currently being treated for cellulitis with keflex and today is her last day of the antibiotic for cellulitis to her left anterior lower leg. She notes that the redness has improved since being treated, however, still pain to the area. [SB]  1420 Consult with Hospitalist, Dr. Katrinka Blazing who agrees with admission at this time.  [SB]    Clinical Course User Index [SB] Carnell Casamento A, PA-C                           Medical Decision Making Amount and/or Complexity of Data Reviewed Labs: ordered.  Risk Prescription drug management. Decision regarding hospitalization.   Patient presents to the emergency department with concerns for lower abdominal pain onset 3 days.  No meds tried prior to arrival.  Patient afebrile.  On exam patient with tenderness to palpation noted to lower abdomen bilaterally.  Remainder of exam without acute findings.  Differential diagnosis includes pancreatitis, cholecystitis, appendicitis, acute cystitis, diverticulitis.   Labs:  I ordered, and personally interpreted labs.  The pertinent results include:  Urinalysis negative for UTI CMP with slightly elevated glucose at 187 otherwise unremarkable. CBC with leukocytosis at 16.1 Lipase at 24  Imaging: I ordered imaging studies including CT abdomen pelvis with I independently visualized and interpreted imaging which showed  1. Acute diverticulitis involving the distal descending colon. No  evidence for perforation or abscess.  2. Small hiatal hernia.   I agree with the radiologist interpretation  Medications:  I ordered medication including Rocephin and Flagyl for diverticulitis treatment.  I have reviewed the patients home medicines and have made adjustments as  needed   Consultations: I requested consultation with the Hospitalist, Dr.Smith and discussed lab and imaging findings as well as pertinent plan - they recommend: admission at this time   Disposition: Presenting suspicious for diverticulitis without abscess at this time.  Doubt acute cystitis.  Doubt pancreatitis, cholecystitis, appendicitis at this time. After consideration of the diagnostic results and the patients response to treatment, I feel that the patient would benefit from Admission to the hospital.  Discussed with patient plans for admission.  Answered all available questions.  Patient agrees with admission at this time.  Patient appears safe for admission.  This chart was dictated using voice recognition software, Dragon. Despite the best efforts of this provider to proofread and correct errors, errors may still occur which can change documentation meaning.  Final Clinical Impression(s) / ED Diagnoses Final diagnoses:  Diverticulitis    Rx / DC Orders ED Discharge Orders     None         Yahaira Bruski A, PA-C 04/26/22 1425    Wynetta Fines, MD 04/27/22 [phone]

## 2022-04-26 NOTE — ED Triage Notes (Signed)
Patient complains of cramping abdominal pain that started Friday and got worse yesterday. Patient describes pain as feeling like labor contractions.

## 2022-04-27 ENCOUNTER — Encounter (HOSPITAL_COMMUNITY): Payer: Self-pay | Admitting: Internal Medicine

## 2022-04-27 ENCOUNTER — Other Ambulatory Visit: Payer: Self-pay

## 2022-04-27 LAB — BASIC METABOLIC PANEL
Anion gap: 11 (ref 5–15)
BUN: 8 mg/dL (ref 8–23)
CO2: 27 mmol/L (ref 22–32)
Calcium: 8.4 mg/dL — ABNORMAL LOW (ref 8.9–10.3)
Chloride: 100 mmol/L (ref 98–111)
Creatinine, Ser: 0.74 mg/dL (ref 0.44–1.00)
GFR, Estimated: >60 mL/min (ref >60)
Glucose, Bld: 132 mg/dL — ABNORMAL HIGH (ref 70–99)
Potassium: 3.5 mmol/L (ref 3.5–5.1)
Sodium: 138 mmol/L (ref 135–145)

## 2022-04-27 LAB — CBC
HCT: 35.1 % — ABNORMAL LOW (ref 36.0–46.0)
Hemoglobin: 12.1 g/dL (ref 12.0–15.0)
MCH: 25.1 pg — ABNORMAL LOW (ref 26.0–34.0)
MCHC: 34.5 g/dL (ref 30.0–36.0)
MCV: 72.7 fL — ABNORMAL LOW (ref 80.0–100.0)
Platelets: 254 10*3/uL (ref 150–400)
RBC: 4.83 MIL/uL (ref 3.87–5.11)
RDW: 14.6 % (ref 11.5–15.5)
WBC: 11.2 10*3/uL — ABNORMAL HIGH (ref 4.0–10.5)
nRBC: 0 % (ref 0.0–0.2)

## 2022-04-27 LAB — HIV ANTIBODY (ROUTINE TESTING W REFLEX): HIV Screen 4th Generation wRfx: NONREACTIVE

## 2022-04-27 LAB — GLUCOSE, CAPILLARY: Glucose-Capillary: 135 mg/dL — ABNORMAL HIGH (ref 70–99)

## 2022-04-27 MED ORDER — CIPROFLOXACIN HCL 500 MG PO TABS: 500.0000 mg | ORAL_TABLET | Freq: Two times a day (BID) | ORAL | 0 refills | Status: AC

## 2022-04-27 MED ORDER — METRONIDAZOLE 500 MG PO TABS: 500.0000 mg | ORAL_TABLET | Freq: Three times a day (TID) | ORAL | 0 refills | Status: AC

## 2022-04-27 NOTE — Care Management CC44 (Signed)
Condition Code 44 Documentation Completed  Patient Details  Name: Bianca Medina MRN: [id number] Date of Birth: [birth year]   Condition Code 44 given:  Yes Patient signature on Condition Code 44 notice:  Yes Documentation of 2 MD's agreement:  Yes Code 44 added to claim:  Yes    Pollie Friar, RN 04/27/2022, 10:44 AM

## 2022-04-27 NOTE — Discharge Summary (Signed)
Physician Discharge Summary   Patient: Bianca Medina MRN: [id number] DOB: 15-Aug-1950  Admit date:     04/26/2022  Discharge date: 04/27/22  Discharge Physician: Manfred Shirts Angelly Spearing   PCP: Libby Maw, MD   Recommendations at discharge:    Follow up with PCP post DC  Discharge Diagnoses: Principal Problem:   Diverticulitis Active Problems:   Cellulitis   Leukocytosis   Invasive ductal carcinoma of breast ([place])   Essential hypertension   Type 2 diabetes mellitus with complication, without long-term current use of insulin (HCC)   Hyperlipidemia   Class 3 severe obesity due to excess calories with serious comorbidity and body mass index (BMI) of 40.0 to 44.9 in adult [facility])  Resolved Problems:   * No resolved hospital problems. *  Hospital Course:  Bianca Medina is a 71 y.o. female with medical history significant of hypertension, hyperlipidemia, DM type II,  asthma, right breast cancer, anemia, colon polyps, and GERD who presents with complaints of lower abdominal pain.  Symptoms started after she went to the OB/GYN and they had done a pelvic exam 2 days ago.  Describes pain as like she is having contractions and notes that it feels like she has to have a bowel movement but cannot.  Patient's last bowel movement was 3 days ago.  She has been having some nausea and chills.  Denies any vomiting or diarrhea symptoms.    She had been on antibiotics of Keflex for concern for a left lower extremity cellulitis on 12/5 to take ~2/10.  Records note patient had just recently had a colonoscopy in July of this year due to bleeding.  Reports that she was told that they did not find any polyps and that she needed to follow-up with a repeat exam in 5 years.   Patient reports that she is in the process of obtaining a second opinion in regards to the diagnosis of invasive ductal neoplasm of the right breast that was HER2 negative, 95% ER positive, and 90% PR positive with a K-67 of 5%.   Initially diagnosed in August of this year.  Bianca Medina had been scheduled for right mastectomy with Dr. Coralie Keens here at [facility], but patient states that the doctor performing the procedure was not appropriately certified.   Patient wants to go home today, she has not had a regular diet  nor has she had a BM yet. She says she will follow up with her PCP and also take a stool softner  Assessment and Plan: Diverticulitis Acute.  Patient presents with complaints of lower abdominal pain after having a pelvic exam 2 days ago.  CT imaging reveals concern for diverticulitis of the descending colon without signs of abscess or perforation.  Patient had just recently had a colonoscopy Managed with empiric antibiotics of Rocephin and metronidazole -Recommend patient to follow-up in outpatient setting with GI -DC on Cipro and metronidazole   Cellulitis of the left lower extremity Patient reported having redness and erythema of the left lower extremity for which the cause is unclear.   Leukocytosis Improved to 11  Suspect secondary to above.    Invasive ductal carcinoma of the right breast Patient diagnosed with invasive ductal carcinoma of the right breast in August of this year.  Patient had undergone treatment and she reports that the surgeon was not appropriately certified to do the procedure.  Currently reports looking for second opinion. -Recommended patient follow-up with oncology and seek appropriate treatment   Essential hypertension -  Continue losartan and chlorthalidone in a.m.   Diabetes mellitus type 2, without long-term use of insulin On admission glucose 185.  Last hemoglobin A1c 7.5 on 04/23/2022.  Patient medication regimen includes metformin 500 mg twice daily -Hypoglycemic protocols -Hold metformin -CBGs before every meal with sensitive SSI   Hyperlipidemia -Continue atorvastatin   Morbid obesity BMI 43.93 kg/m          Consultants: None Procedures  performed: None  Disposition: Home Diet recommendation:  Regular diet DISCHARGE MEDICATION: Allergies as of 04/27/2022       Reactions   Codeine Other (See Comments)   nightmares Other reaction(s): Other nightmares nightmares        Medication List     STOP taking these medications    cephALEXin 500 MG capsule Commonly known as: KEFLEX       TAKE these medications    aspirin EC 81 MG tablet Take 81 mg by mouth daily. Swallow whole.   atorvastatin 10 MG tablet Commonly known as: LIPITOR Take 1 tablet (10 mg total) by mouth daily.   Biotin 5000 MCG Caps Take 5,000 mcg by mouth daily.   chlorthalidone 25 MG tablet Commonly known as: HYGROTON TAKE 1 TABLET (25 MG TOTAL) BY MOUTH DAILY.   ciprofloxacin 500 MG tablet Commonly known as: Cipro Take 1 tablet (500 mg total) by mouth 2 (two) times daily for 5 days.   fluticasone 50 MCG/ACT nasal spray Commonly known as: FLONASE Place 2 sprays into both nostrils daily.   ibuprofen 600 MG tablet Commonly known as: ADVIL Take 1 tablet (600 mg total) by mouth every 6 (six) hours as needed.   losartan 25 MG tablet Commonly known as: COZAAR Take 25 mg by mouth daily.   meloxicam 15 MG tablet Commonly known as: MOBIC Take 15 mg by mouth daily.   metFORMIN 500 MG 24 hr tablet Commonly known as: GLUCOPHAGE-XR TAKE 1 TABLET BY MOUTH EVERY DAY WITH BREAKFAST What changed:  how much to take how to take this when to take this additional instructions   metroNIDAZOLE 500 MG tablet Commonly known as: Flagyl Take 1 tablet (500 mg total) by mouth 3 (three) times daily for 5 days.   montelukast 10 MG tablet Commonly known as: SINGULAIR Take 10 mg by mouth daily.   Potassium Chloride ER 20 MEQ Tbcr Take 1 tablet by mouth daily.   ProAir HFA 108 (90 Base) MCG/ACT inhaler Generic drug: albuterol Inhale 1-2 puffs into the lungs every 4 (four) hours as needed for wheezing or shortness of breath.   Symbicort 80-4.5  MCG/ACT inhaler Generic drug: budesonide-formoterol Inhale 2 puffs into the lungs 2 (two) times daily.   Vitamin D (Ergocalciferol) 1.25 MG (50000 UNIT) Caps capsule Commonly known as: DRISDOL Take 50,000 Units by mouth once a week.   vitamin E 1000 UNIT capsule Take 1,000 Units by mouth daily.        Discharge Exam: There were no vitals filed for this visit. Constitutional: Elderly female who appears to be in no acute distress at this time Eyes: PERRL, lids and conjunctivae normal ENMT: Mucous membranes are moist.   Neck: normal, supple,  Respiratory: clear to auscultation bilaterally, no wheezing, no crackles. Normal respiratory effort.  Cardiovascular: Regular rate and rhythm. No extremity edema. 2+ pedal pulses.   Abdomen: No tenderness elicited on abdominal exam  Bowel sounds present. Musculoskeletal: no clubbing / cyanosis. No joint deformity upper and lower extremities. Good ROM, no contractures. Normal muscle tone.  Skin: Mild erythema  noted of the left lower extremity and Neurologic: CN 2-12 grossly intact. Strength 5/5 in all 4.  Psychiatric: Poor judgment and insight. Alert and oriented x 3. Normal mood.   Condition at discharge: good  The results of significant diagnostics from this hospitalization (including imaging, microbiology, ancillary and laboratory) are listed below for reference.   Imaging Studies: CT ABDOMEN PELVIS W CONTRAST  Result Date: 04/26/2022 CLINICAL DATA:  Left lower quadrant abdominal pain EXAM: CT ABDOMEN AND PELVIS WITH CONTRAST TECHNIQUE: Multidetector CT imaging of the abdomen and pelvis was performed using the standard protocol following bolus administration of intravenous contrast. RADIATION DOSE REDUCTION: This exam was performed according to the departmental dose-optimization program which includes automated exposure control, adjustment of the mA and/or kV according to patient size and/or use of iterative reconstruction technique.  CONTRAST:  92m OMNIPAQUE IOHEXOL 350 MG/ML SOLN COMPARISON:  09/05/2021 FINDINGS: Lower chest: No pleural fluid or airspace disease. Hepatobiliary: No focal liver abnormality is seen. No gallstones, gallbladder wall thickening, or biliary dilatation. Pancreas: Unremarkable. No pancreatic ductal dilatation or surrounding inflammatory changes. Spleen: Normal in size without focal abnormality. Adrenals/Urinary Tract: Unchanged left adrenal nodule measuring 1.4 cm. This is been stable since 07/25/2018 compatible with a benign adenoma. No follow-up imaging recommended. Normal right adrenal gland. No nephrolithiasis, hydronephrosis or suspicious mass. Urinary bladder appears normal. Stomach/Bowel: Small hiatal hernia. Signs of acute diverticulitis involving the distal descending colon is identified with focally inflamed diverticula, short segment of colonic wall thickening, and surrounding inflammatory fat stranding. Trace free fluid extends along the left pericolic gutter. No focal fluid collections identified to suggest abscess. The remaining bowel loops are unremarkable. Vascular/Lymphatic: No signs of abdominopelvic adenopathy. Normal appearance of the abdominal aorta. Reproductive: Status post hysterectomy. No adnexal masses. Other: No significant abdominopelvic ascites. No focal fluid collections. No signs of pneumoperitoneum to suggest free perforation Musculoskeletal: No acute or significant osseous findings. IMPRESSION: 1. Acute diverticulitis involving the distal descending colon. No evidence for perforation or abscess. 2. Small hiatal hernia. Electronically Signed   By: TKerby MoorsM.D.   On: 04/26/2022 11:31   VAS [place]OWER EXTREMITY VENOUS (DVT) (ONLY [facility] & [facility])  Result Date: 04/18/2022  Lower Venous DVT Study Patient Name:  DBRIANNI MANTHE Date of Exam:   04/17/2022 Medical Rec #: [id number]      Accession #:    [id number] of Birth: 103/22/1952      Patient Gender: F Patient Age:   89years [facility] [place]OWER EXTREMITY VENOUS (DVT) Referring Phys: BRITNI HENDERLY --------------------------------------------------------------------------------  Indications: Edema.  Risk Factors: None identified. Limitations: Body habitus and poor ultrasound/tissue interface. Comparison Study: No prior studies. Performing Technologist: GOliver HumRVT  Examination Guidelines: A complete evaluation includes B-mode imaging, spectral Doppler, color Doppler, and power Doppler as needed of all accessible portions of each vessel. Bilateral testing is considered an integral part of a complete examination. Limited examinations for reoccurring indications may be performed as noted. The reflux portion of the exam is performed with the patient in reverse Trendelenburg.  +-----+---------------+---------+-----------+----------+--------------+ RIGHTCompressibilityPhasicitySpontaneityPropertiesThrombus Aging +-----+---------------+---------+-----------+----------+--------------+ CFV  Full           Yes      Yes                                 +-----+---------------+---------+-----------+----------+--------------+   +---------+---------------+---------+-----------+----------+--------------+ LEFT  CompressibilityPhasicitySpontaneityPropertiesThrombus Aging +---------+---------------+---------+-----------+----------+--------------+ CFV      Full           Yes      Yes                                 +---------+---------------+---------+-----------+----------+--------------+ SFJ      Full                                                        +---------+---------------+---------+-----------+----------+--------------+ FV Prox  Full                                                        +---------+---------------+---------+-----------+----------+--------------+ FV Mid   Full                                                         +---------+---------------+---------+-----------+----------+--------------+ FV DistalFull                                                        +---------+---------------+---------+-----------+----------+--------------+ PFV      Full                                                        +---------+---------------+---------+-----------+----------+--------------+ POP      Full           Yes      Yes                                 +---------+---------------+---------+-----------+----------+--------------+ PTV      Full                                                        +---------+---------------+---------+-----------+----------+--------------+ PERO     Full                                                        +---------+---------------+---------+-----------+----------+--------------+    Summary: RIGHT: - No evidence of common femoral vein obstruction.  LEFT: - There is no evidence of deep vein thrombosis in the lower extremity.  - No cystic structure found in the popliteal fossa.  *See table(s) above for measurements and observations. Electronically signed by Orlie Pollen on  04/18/2022 at 1:28:42 PM.    Final    DG Tibia/Fibula Left  Result Date: 04/17/2022 CLINICAL DATA:  Left leg pain for 1 week, swelling EXAM: LEFT TIBIA AND FIBULA - 2 VIEW; LEFT ANKLE COMPLETE - 3+ VIEW COMPARISON:  None Available. FINDINGS: Left ankle: Frontal, oblique, and lateral views are obtained. No fracture, subluxation, or dislocation. Joint spaces are well preserved. There is mild diffuse subcutaneous edema. Left tibia/fibula: Frontal and lateral views of the left tibia and fibula are obtained. There are no acute displaced fractures. Alignment of the left knee and ankle is anatomic. Mild medial compartmental osteoarthritis left knee. There is diffuse subcutaneous edema throughout the left lower extremity. IMPRESSION: 1. Diffuse subcutaneous edema throughout the left lower leg below the knee.  2. Mild left knee osteoarthritis. 3. No acute fracture. Electronically Signed   By: Randa Ngo M.D.   On: 04/17/2022 16:48   DG Ankle Complete Left  Result Date: 04/17/2022 CLINICAL DATA:  Left leg pain for 1 week, swelling EXAM: LEFT TIBIA AND FIBULA - 2 VIEW; LEFT ANKLE COMPLETE - 3+ VIEW COMPARISON:  None Available. FINDINGS: Left ankle: Frontal, oblique, and lateral views are obtained. No fracture, subluxation, or dislocation. Joint spaces are well preserved. There is mild diffuse subcutaneous edema. Left tibia/fibula: Frontal and lateral views of the left tibia and fibula are obtained. There are no acute displaced fractures. Alignment of the left knee and ankle is anatomic. Mild medial compartmental osteoarthritis left knee. There is diffuse subcutaneous edema throughout the left lower extremity. IMPRESSION: 1. Diffuse subcutaneous edema throughout the left lower leg below the knee. 2. Mild left knee osteoarthritis. 3. No acute fracture. Electronically Signed   By: Randa Ngo M.D.   On: 04/17/2022 16:48    Microbiology: Results for orders placed or performed in visit on 12/24/21  Urine Culture     Status: None   Collection Time: 12/24/21  3:20 PM  Result Value Ref Range Status   MICRO NUMBER: [id number]  Final   SPECIMEN QUALITY: Adequate  Final   Sample Source URINE  Final   STATUS: FINAL  Final   Result:   Final    Mixed genital flora isolated. These superficial bacteria are not indicative of a urinary tract infection. No further organism identification is warranted on this specimen. If clinically indicated, recollect clean-catch, mid-stream urine and transfer  immediately to Urine Culture Transport Tube.    COMMENT:   Final    Due to supply chain limits, the laboratory is temporarily performing urine cultures from FDA approved preservative tubes that have not been validated by [facility], as well as from refrigerated sterile urine collection cups that do not contain a   preservative. Culture of unpreserved urine may produce falsely elevated bacterial counts.     Labs: CBC: Recent Labs  Lab 04/26/22 0836 04/27/22 0425  WBC 16.1* 11.2*  HGB 13.4 12.1  HCT 39.7 35.1*  MCV 74.9* 72.7*  PLT 290 474   Basic Metabolic Panel: Recent Labs  Lab 04/26/22 0836 04/27/22 0425  NA 138 138  K 4.0 3.5  CL 100 100  CO2 28 27  GLUCOSE 187* 132*  BUN 15 8  CREATININE 0.99 0.74  CALCIUM 9.1 8.4*   Liver Function Tests: Recent Labs  Lab 04/26/22 0836  AST 19  ALT 16  ALKPHOS 94  BILITOT 0.8  PROT 7.3  ALBUMIN 3.3*   CBG: Recent Labs  Lab 04/26/22 1731 04/26/22 2108 04/27/22 0739  GLUCAP 149* 115* 135*  Discharge time spent: greater than 30 minutes.  Signed: Cristela Felt, MD [facility] 04/27/2022

## 2022-04-27 NOTE — Care Management Obs Status (Signed)
[facility] NOTIFICATION   Patient Details  Name: Bianca Medina MRN: [id number] Date of Birth: 05/16/1951   Medicare Observation Status Notification Given:  Yes    Pollie Friar, RN 04/27/2022, 10:44 AM

## 2022-04-27 NOTE — Progress Notes (Signed)
Mobility Specialist Progress Note:   04/27/22 0954  Mobility  Activity Ambulated independently in hallway  Level of Assistance Independent  Assistive Device None  Distance Ambulated (ft) 50 ft  Activity Response Tolerated well  Mobility Referral Yes  $Mobility charge 1 Mobility   Pt received sitting EOB and agreeable. No complaints of pain or SOB. Pt deferred longer session d/t needing to get ready for upcoming discharge. Pt left ambulating in room with all needs met and call bell in reach.   Andrey Campanile Mobility Specialist Please contact via SecureChat or  Rehab office at [phone]

## 2022-04-27 NOTE — TOC Initial Note (Signed)
Transition of Care [facility]) - Initial/Assessment Note    Patient Details  Name: Bianca Medina MRN: [id number] Date of Birth: 1951/02/11  [facility] CM/SW Contact:    Pollie Friar, RN Phone Number: 04/27/2022, 11:04 AM  Clinical Narrative:                 Pt is discharging home with self care. No needs per TOC.    Barriers to Discharge: No Barriers Identified   Patient Goals and CMS Choice        Expected Discharge Plan and Services           Expected Discharge Date: 04/27/22                                    Prior Living Arrangements/Services                       Activities of Daily Living Home Assistive Devices/Equipment: None ADL Screening (condition at time of admission) Patient's cognitive ability adequate to safely complete daily activities?: Yes Is the patient deaf or have difficulty hearing?: No Does the patient have difficulty seeing, even when wearing glasses/contacts?: No Does the patient have difficulty concentrating, remembering, or making decisions?: No Patient able to express need for assistance with ADLs?: No Does the patient have difficulty dressing or bathing?: No Independently performs ADLs?: Yes (appropriate for developmental age) Does the patient have difficulty walking or climbing stairs?: No Weakness of Legs: Left Weakness of Arms/Hands: None  Permission Sought/Granted                  Emotional Assessment              Admission diagnosis:  Diverticulitis [K57.92] Patient Active Problem List   Diagnosis Date Noted   Diverticulitis 04/26/2022   Cellulitis 04/26/2022   Leukocytosis 04/26/2022   Invasive ductal carcinoma of breast ([place]) 04/26/2022   Type 2 diabetes mellitus with complication, without long-term current use of insulin ([place]) 04/26/2022   Slow transit constipation 01/26/2022   Left leg swelling 11/10/2021   Hyperlipidemia 11/10/2021   Bruising 06/11/2021   Other  schizoaffective disorders ([place]) 04/30/2021   Thyroid nodule 04/30/2021   Screening for colon cancer 04/01/2021   Insect bite of right wrist 04/01/2021   Bite, insect 04/01/2021   Rash 03/28/2021   Iron deficiency 03/19/2021   Need for influenza vaccination 03/19/2021   Snoring 01/27/2021   Sleep related bruxism 01/27/2021   Gasping for breath 01/27/2021   Insomnia due to anxiety and fear 01/27/2021   Class 3 severe obesity due to excess calories with serious comorbidity and body mass index (BMI) of 40.0 to 44.9 in adult [facility]) 01/27/2021   Medicare annual wellness visit, subsequent 01/24/2021   Dehydration 12/26/2020   History of syncope 12/26/2020   Hospital discharge follow-up 12/26/2020   Healthcare maintenance 12/01/2020   Vitamin D deficiency 12/01/2020   Ear itching 06/27/2019   Common migraine 06/23/2019   History of anemia 03/31/2019   Pre-diabetes 03/31/2019   Viral syndrome 03/31/2019   Sensorineural hearing loss (SNHL), bilateral 08/12/2017   Tinnitus of both ears 08/12/2017   Morbid obesity ([place]) 12/28/2016   Left breast mass 07/19/2014   Breast tenderness in female 07/19/2014   Menopause 01/09/2014   Vaginal atrophy 01/09/2014   Skin lesion 12/23/2012   Essential hypertension 10/31/2012   Heartburn 10/31/2012  PCP:  Libby Maw, MD Pharmacy:   [facility] [place], [place] [address] [place] [address] Phone: [phone] Fax: [phone]     Social Determinants of Health (SDOH) Interventions    Readmission Risk Interventions     No data to display

## 2022-04-29 ENCOUNTER — Telehealth: Payer: Self-pay | Admitting: Family Medicine

## 2022-04-29 NOTE — Telephone Encounter (Signed)
FYI:Pt wants Dr Ethelene Hal to know she was admitted to 04/26/2022 - 04/27/2022 (26 hours) [facility] for ED to Hosp-Admission. Diverticulitis Principal problem. I offered an OV, she declined at this time. She is waiting for her gynecologist to call her back.

## 2022-04-30 ENCOUNTER — Telehealth: Payer: Self-pay

## 2022-04-30 ENCOUNTER — Telehealth: Payer: Self-pay | Admitting: *Deleted

## 2022-04-30 DIAGNOSIS — F22 Delusional disorders: Secondary | ICD-10-CM

## 2022-04-30 DIAGNOSIS — Z853 Personal history of malignant neoplasm of breast: Secondary | ICD-10-CM

## 2022-04-30 NOTE — Telephone Encounter (Signed)
-----   Message from Princess Bruins, MD sent at 04/24/2022  3:18 PM EST ----- Regarding: Refer to Psychiatry and Oncology 1) Patient thinks that someone put an "implant" in her body that is moving in her abdomen and giving her vaginal sensations.  She thinks that it was done to retaliate against her.  Patient appears psychotic.  Refer to Psychiatry.  2) Dx of Rt Breast invasive Ca untreated as patient report not finding a Research officer, trade union.  Refer to [facility] Oncology.  3) Patient says that she wants to find a Psychologist, forensic.  If patient doesn't follow above referral recommendations, please discharge patient from my care.

## 2022-04-30 NOTE — Telephone Encounter (Signed)
Transition Care Management Unsuccessful Follow-up Telephone Call  Date of discharge and from where:  04/27/22 [facility]. Dx: Diverticulitis  Attempts:  1st Attempt  Reason for unsuccessful TCM follow-up call:  Voice mail full

## 2022-04-30 NOTE — Telephone Encounter (Signed)
Both referrals placed in epic for the offices to call and schedule patient.

## 2022-05-01 NOTE — Telephone Encounter (Signed)
Transition Care Management Unsuccessful Follow-up Telephone Call  Date of discharge and from where:  04/27/22 [facility]. Dx: Diverticulitis   Attempts:  2nd Attempt  Reason for unsuccessful TCM follow-up call:  Voice mail full

## 2022-05-04 NOTE — Telephone Encounter (Signed)
05/04/2022 11:32am Transition Care Management Unsuccessful Follow-up Telephone Call  Date of discharge and from where:  04/27/22 [facility] Inpatient. Dx: Diverticulitis   Attempts:  3rd Attempt  Reason for unsuccessful TCM follow-up call:  Left voice message

## 2022-05-05 DIAGNOSIS — J301 Allergic rhinitis due to pollen: Secondary | ICD-10-CM | POA: Diagnosis not present

## 2022-05-05 DIAGNOSIS — J3081 Allergic rhinitis due to animal (cat) (dog) hair and dander: Secondary | ICD-10-CM | POA: Diagnosis not present

## 2022-05-05 DIAGNOSIS — J3089 Other allergic rhinitis: Secondary | ICD-10-CM | POA: Diagnosis not present

## 2022-05-05 DIAGNOSIS — J4520 Mild intermittent asthma, uncomplicated: Secondary | ICD-10-CM | POA: Diagnosis not present

## 2022-05-06 ENCOUNTER — Ambulatory Visit: Payer: Medicare PPO | Admitting: Family Medicine

## 2022-05-06 ENCOUNTER — Encounter: Payer: Self-pay | Admitting: Family Medicine

## 2022-05-06 VITALS — BP 118/80 | HR 76 | Temp 97.9°F | Wt 268.0 lb

## 2022-05-06 DIAGNOSIS — R600 Localized edema: Secondary | ICD-10-CM | POA: Diagnosis not present

## 2022-05-06 NOTE — Patient Instructions (Addendum)
I am glad your leg is improving.  Please continue to use your compression stockings.  Please follow-up with your vascular surgeon.  We will also replace referral to help coordinate this appointment.  Happy Holidays  Marny Lowenstein, MD, MS

## 2022-05-06 NOTE — Progress Notes (Signed)
Assessment/Plan:   Problem List Items Addressed This Visit   None Visit Diagnoses     Bilateral lower extremity edema    -  Primary   Relevant Orders   Ambulatory referral to Vascular Surgery      Considering the patient's history, symptoms, and physical examination results, she seems to be suffering from a potential venous stasis disease which is most likely causing the bilateral lower extremity swelling. To confirm the diagnosis, a vascular surgery appointment should be scheduled for further evaluation.   Subjective:  HPI:  Chief Complaint: The patient presents with primary complaint of chronic left lower leg pain and swelling for about a month.  HISTORY OF PRESENT ILLNESS: -Leg Pain: The patient has been experiencing left lower leg pain and swelling that initiated three weeks ago. She reported an abrupt onset of a sharp pain in her lower left leg, causing swelling and a warm sensation. She visited the emergency department multiple times for the related issues without much improvement following different treatments. The patient has a record of bilateral lower extremity edema and has been wearing compression stockings which she mentioned has improved the leg swelling. There haven't been any symptoms of fevers, chest pain, shortness of breath, headaches or claudication.  REVIEW OF SYSTEMS:  Upon physical exam, patient appeared in no acute distress, was awake, alert, and oriented to person, place, and time. There has been no cyanosis or JVD, the breath sounds were clear to auscultation bilaterally and no murmur heard on heart auscultation. No auditory wheezing was noted. Her skin was warm, without rashes or ulcers, albeit some changes consistent with chronic venous disease. Mood, affect, and judgment were appropriate for age and situation. Past Surgical History:  Procedure Laterality Date   ABDOMINAL HYSTERECTOMY     BLADDER REPAIR     BREAST BIOPSY Right    2023   COLONOSCOPY   11/2019   1 hpp and 1 tubl adenoma   COLONOSCOPY WITH PROPOFOL N/A 12/03/2021   Procedure: COLONOSCOPY WITH PROPOFOL;  Surgeon: Robert Bellow, MD;  Location: [facility] ENDOSCOPY;  Service: Endoscopy;  Laterality: N/A;   GUM SURGERY  1980's   MANDIBLE FRACTURE SURGERY     TUBAL LIGATION      Outpatient Medications Prior to Visit  Medication Sig Dispense Refill   aspirin EC 81 MG tablet Take 81 mg by mouth daily. Swallow whole.     atorvastatin (LIPITOR) 10 MG tablet Take 1 tablet (10 mg total) by mouth daily. 90 tablet 1   Biotin 5000 MCG CAPS Take 5,000 mcg by mouth daily.      chlorthalidone (HYGROTON) 25 MG tablet TAKE 1 TABLET (25 MG TOTAL) BY MOUTH DAILY. 90 tablet 1   fluticasone (FLONASE) 50 MCG/ACT nasal spray Place 2 sprays into both nostrils daily. 16 g 6   ibuprofen (ADVIL) 600 MG tablet Take 1 tablet (600 mg total) by mouth every 6 (six) hours as needed. 30 tablet 0   losartan (COZAAR) 25 MG tablet Take 25 mg by mouth daily.     meloxicam (MOBIC) 15 MG tablet Take 15 mg by mouth daily.     metFORMIN (GLUCOPHAGE-XR) 500 MG 24 hr tablet TAKE 1 TABLET BY MOUTH EVERY DAY WITH BREAKFAST (Patient taking differently: Take 500 mg by mouth daily with breakfast.) 90 tablet 1   montelukast (SINGULAIR) 10 MG tablet Take 10 mg by mouth daily.     Potassium Chloride ER 20 MEQ TBCR Take 1 tablet by mouth daily.  PROAIR HFA 108 (90 Base) MCG/ACT inhaler Inhale 1-2 puffs into the lungs every 4 (four) hours as needed for wheezing or shortness of breath.      SYMBICORT 80-4.5 MCG/ACT inhaler Inhale 2 puffs into the lungs 2 (two) times daily.     Vitamin D, Ergocalciferol, (DRISDOL) 1.25 MG (50000 UNIT) CAPS capsule Take 50,000 Units by mouth once a week.     vitamin E 1000 UNIT capsule Take 1,000 Units by mouth daily.     No facility-administered medications prior to visit.    Family History  Problem Relation Age of Onset   Cancer Mother        LUNG- SMOKER   Diabetes Father     Hypertension Father    Heart disease Father    Stroke Father    Breast cancer Sister        lates 31s   Cancer Paternal Aunt        COLON   Cancer Paternal Uncle        COLON    Social History   Socioeconomic History   Marital status: Single    Spouse name: Not on file   Number of children: 3   Years of education: college   Highest education level: Not on file  Occupational History   Occupation: Architectural technologist: [employer]  Tobacco Use   Smoking status: Never   Smokeless tobacco: Never  Vaping Use   Vaping Use: Never used  Substance and Sexual Activity   Alcohol use: Not Currently   Drug use: Never   Sexual activity: Not Currently    Partners: Male    Birth control/protection: Surgical, Abstinence    Comment: HYSTERECTOMY-1st intercourse 34 yo-5 partners  Other Topics Concern   Not on file  Social History Narrative   Lives alone   Right handed     Caffeine use: soda/tea couple times a week   Social Determinants of Health   Financial Resource Strain: Low Risk  (03/31/2022)   Overall Financial Resource Strain (CARDIA)    Difficulty of Paying Living Expenses: Not hard at all  Food Insecurity: No Food Insecurity (04/27/2022)   Hunger Vital Sign    Worried About Running Out of Food in the Last Year: Never true    Ran Out of Food in the Last Year: Never true  Transportation Needs: No Transportation Needs (04/27/2022)   PRAPARE - Hydrologist (Medical): No    Lack of Transportation (Non-Medical): No  Physical Activity: Inactive (03/31/2022)   Exercise Vital Sign    Days of Exercise per Week: 0 days    Minutes of Exercise per Session: 0 min  Stress: No Stress Concern Present (03/31/2022)   [place]    Feeling of Stress : Not at all  Social Connections: Not on file  Intimate Partner Violence: Not At Risk (04/27/2022)    Humiliation, Afraid, Rape, and Kick questionnaire    Fear of Current or Ex-Partner: No    Emotionally Abused: No    Physically Abused: No    Sexually Abused: No  Objective:  Physical Exam: BP 118/80 (BP Location: Left Arm, Patient Position: Sitting, Cuff Size: Large)   Pulse 76   Temp 97.9 F (36.6 C) (Temporal)   Wt 268 lb (121.6 kg)   SpO2 98%   BMI 44.60 kg/m    General: No acute distress. Awake and conversant.  Eyes: Normal conjunctiva, anicteric. Round symmetric pupils.  ENT: Hearing grossly intact. No nasal discharge.  Neck: Neck is supple. No masses or thyromegaly.  Respiratory: Respirations are non-labored. No auditory wheezing.  Skin: Warm. No rashes or ulcers.  Psych: Alert and oriented. Cooperative, Appropriate mood and affect, Normal judgment.  CV: No cyanosis or JVD MSK: Bilateral lower extremity swelling with 2+ pitting edema up to mid shins, there is no signs of necrosis, there are skin changes consistent with chronic venous disease, there are some venous varicosities scattered across both legs  neuro: Sensation and CN II-XII grossly normal.        Alesia Banda, MD, MS

## 2022-05-07 ENCOUNTER — Ambulatory Visit
Admission: RE | Admit: 2022-05-07 | Discharge: 2022-05-07 | Disposition: A | Discharge status: Home / Self Care | Payer: Medicare PPO | Source: Ambulatory Visit | Attending: Allergy | Admitting: Allergy

## 2022-05-07 ENCOUNTER — Other Ambulatory Visit: Payer: Self-pay | Admitting: Allergy

## 2022-05-07 DIAGNOSIS — I517 Cardiomegaly: Secondary | ICD-10-CM | POA: Diagnosis not present

## 2022-05-07 DIAGNOSIS — I700 Atherosclerosis of aorta: Secondary | ICD-10-CM | POA: Diagnosis not present

## 2022-05-07 DIAGNOSIS — J4520 Mild intermittent asthma, uncomplicated: Secondary | ICD-10-CM | POA: Diagnosis not present

## 2022-05-08 DIAGNOSIS — H5711 Ocular pain, right eye: Secondary | ICD-10-CM | POA: Diagnosis not present

## 2022-05-13 DIAGNOSIS — I872 Venous insufficiency (chronic) (peripheral): Secondary | ICD-10-CM | POA: Diagnosis not present

## 2022-05-13 DIAGNOSIS — E669 Obesity, unspecified: Secondary | ICD-10-CM | POA: Diagnosis not present

## 2022-05-17 ENCOUNTER — Emergency Department (HOSPITAL_COMMUNITY)
Admission: EM | Admit: 2022-05-17 | Discharge: 2022-05-17 | Discharge status: Against Medical Advice | Payer: Medicare PPO

## 2022-05-17 ENCOUNTER — Other Ambulatory Visit: Payer: Self-pay

## 2022-05-17 NOTE — ED Notes (Signed)
Patient called for triage x 1.  No answer at this time

## 2022-05-17 NOTE — ED Notes (Signed)
Patient called for triage x 2.  No answer from lobby

## 2022-05-17 NOTE — ED Notes (Signed)
Patient called for triage x 3.  No answer from lobby.

## 2022-05-19 ENCOUNTER — Telehealth: Payer: Self-pay | Admitting: Family Medicine

## 2022-05-19 NOTE — Telephone Encounter (Signed)
Pt is wanting a cb concerning a colonoscopy and some other things. She would not give any more details. Please advise pt at [phone]

## 2022-05-20 ENCOUNTER — Ambulatory Visit: Payer: Medicare PPO | Admitting: Podiatry

## 2022-05-20 ENCOUNTER — Encounter: Payer: Self-pay | Admitting: Podiatry

## 2022-05-20 ENCOUNTER — Ambulatory Visit (INDEPENDENT_AMBULATORY_CARE_PROVIDER_SITE_OTHER): Payer: Medicare PPO

## 2022-05-20 VITALS — BP 144/57

## 2022-05-20 DIAGNOSIS — M722 Plantar fascial fibromatosis: Secondary | ICD-10-CM

## 2022-05-20 DIAGNOSIS — L02619 Cutaneous abscess of unspecified foot: Secondary | ICD-10-CM

## 2022-05-20 DIAGNOSIS — L03119 Cellulitis of unspecified part of limb: Secondary | ICD-10-CM

## 2022-05-20 MED ORDER — TRIAMCINOLONE ACETONIDE 10 MG/ML IJ SUSP
10.0000 mg | Freq: Once | INTRAMUSCULAR | Status: AC
  Administered 2022-05-20: 10 mg

## 2022-05-20 NOTE — Progress Notes (Signed)
Subjective:   Patient ID: Bianca Medina, female   DOB: 72 y.o.   MRN: [id number]   HPI Patient states she developed a lot of pain in the bottom of her right heel without history of significant injury but does have the remnants of infection in the left lower leg and foot that she wants me to evaluate that caused her to walk differently.  States it has been hurting for the last couple months   Review of Systems  All other systems reviewed and are negative.       Objective:  Physical Exam Vitals and nursing note reviewed.  Constitutional:      Appearance: She is well-developed.  Pulmonary:     Effort: Pulmonary effort is normal.  Musculoskeletal:        General: Normal range of motion.  Skin:    General: Skin is warm.  Neurological:     Mental Status: She is alert.     Neurovascular status intact muscle strength found to be adequate range of motion adequate with patient noted to have exquisite discomfort in the plantar aspect of the right heel moderate depression of the foot moderate obesity and on the left I did note some discoloration of the lower extremity but no breaks in skin currently or erythema edema.  Good digital perfusion     Assessment:  Acute plantar fasciitis right with inflammation and moderate flatfoot deformity along with history cellulitis left that appears under control currently     Plan:  H&P reviewed both conditions and for the left I recommended continuation of elevation compression and for the right I went ahead and reviewed x-ray did sterile prep injected the plantar fascia 3 mg Kenalog 5 mg Xylocaine and applied fascial brace to lift up the arch.  Gave instructions for supportive therapy and reappoint to recheck  X-rays indicate small spur no indication stress fracture arthritis

## 2022-05-21 NOTE — Telephone Encounter (Signed)
FYI. Oncology tried contacting pt w/ no success or returned call on 05/04/22, 05/05/22, 05/06/22, 05/07/22, 05/08/22, and 05/13/22. The referral was closed on 05/19/2022 due to not being able to contact pt to make appt.   Psych has tried contacting pt x1 on 05/07/2022 w/ no answer and unable to LVM.

## 2022-05-25 ENCOUNTER — Telehealth: Payer: Self-pay | Admitting: *Deleted

## 2022-05-25 NOTE — Telephone Encounter (Signed)
See previous telephone encounter dated 04/30/22.    Princess Bruins, MD  Burnice Logan, RN; Georgina Quint Please discharge patient from my care for non-compliance.   Letter pended, copy to Dr. Dellis Filbert to review.

## 2022-05-26 NOTE — Telephone Encounter (Signed)
Letter reviewed and signed by Dr. Dellis Filbert.   Letter to [place] to mail and discharge.    Routing to provider for final review. Will close encounter.

## 2022-05-27 NOTE — Telephone Encounter (Signed)
Returned patients call no answer unable to LM will call back

## 2022-05-30 ENCOUNTER — Emergency Department (HOSPITAL_COMMUNITY)
Admission: EM | Admit: 2022-05-30 | Discharge: 2022-05-30 | Discharge status: Against Medical Advice | Payer: Medicare PPO | Attending: Emergency Medicine | Admitting: Emergency Medicine

## 2022-05-30 ENCOUNTER — Emergency Department (HOSPITAL_COMMUNITY): Payer: Medicare PPO

## 2022-05-30 ENCOUNTER — Emergency Department (HOSPITAL_BASED_OUTPATIENT_CLINIC_OR_DEPARTMENT_OTHER): Payer: Medicare PPO

## 2022-05-30 ENCOUNTER — Other Ambulatory Visit: Payer: Self-pay

## 2022-05-30 DIAGNOSIS — M79605 Pain in left leg: Secondary | ICD-10-CM | POA: Diagnosis not present

## 2022-05-30 DIAGNOSIS — Z5321 Procedure and treatment not carried out due to patient leaving prior to being seen by health care provider: Secondary | ICD-10-CM | POA: Insufficient documentation

## 2022-05-30 DIAGNOSIS — M7989 Other specified soft tissue disorders: Secondary | ICD-10-CM

## 2022-05-30 DIAGNOSIS — M79662 Pain in left lower leg: Secondary | ICD-10-CM | POA: Diagnosis not present

## 2022-05-30 DIAGNOSIS — R079 Chest pain, unspecified: Secondary | ICD-10-CM | POA: Diagnosis not present

## 2022-05-30 DIAGNOSIS — R52 Pain, unspecified: Secondary | ICD-10-CM

## 2022-05-30 LAB — CBC WITH DIFFERENTIAL/PLATELET
Abs Immature Granulocytes: 0.05 10*3/uL (ref 0.00–0.07)
Basophils Absolute: 0.1 10*3/uL (ref 0.0–0.1)
Basophils Relative: 0 %
Eosinophils Absolute: 0.2 10*3/uL (ref 0.0–0.5)
Eosinophils Relative: 2 %
HCT: 40.8 % (ref 36.0–46.0)
Hemoglobin: 13.5 g/dL (ref 12.0–15.0)
Immature Granulocytes: 0 %
Lymphocytes Relative: 18 %
Lymphs Abs: 2.1 10*3/uL (ref 0.7–4.0)
MCH: 24.9 pg — ABNORMAL LOW (ref 26.0–34.0)
MCHC: 33.1 g/dL (ref 30.0–36.0)
MCV: 75.1 fL — ABNORMAL LOW (ref 80.0–100.0)
Monocytes Absolute: 0.7 10*3/uL (ref 0.1–1.0)
Monocytes Relative: 6 %
Neutro Abs: 8.2 10*3/uL — ABNORMAL HIGH (ref 1.7–7.7)
Neutrophils Relative %: 74 %
Platelets: 274 10*3/uL (ref 150–400)
RBC: 5.43 MIL/uL — ABNORMAL HIGH (ref 3.87–5.11)
RDW: 15.3 % (ref 11.5–15.5)
WBC: 11.3 10*3/uL — ABNORMAL HIGH (ref 4.0–10.5)
nRBC: 0 % (ref 0.0–0.2)

## 2022-05-30 LAB — BASIC METABOLIC PANEL
Anion gap: 9 (ref 5–15)
BUN: 17 mg/dL (ref 8–23)
CO2: 28 mmol/L (ref 22–32)
Calcium: 8.7 mg/dL — ABNORMAL LOW (ref 8.9–10.3)
Chloride: 100 mmol/L (ref 98–111)
Creatinine, Ser: 0.88 mg/dL (ref 0.44–1.00)
GFR, Estimated: >60 mL/min (ref >60)
Glucose, Bld: 140 mg/dL — ABNORMAL HIGH (ref 70–99)
Potassium: 4.1 mmol/L (ref 3.5–5.1)
Sodium: 137 mmol/L (ref 135–145)
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
## 2022-05-30 NOTE — ED Triage Notes (Signed)
Left lower leg pain and swelling pt states has cream for it at home that has helped some. Also reports feeling "like I have fluid in my chest" Denies chest pain or sob

## 2022-05-30 NOTE — Progress Notes (Signed)
LLE venous duplex has been completed.  Preliminary results given to Suella Broad, PA-C.  Results can be found under chart review under CV PROC. 05/30/2022 10:58 AM Bianka Liberati RVT, RDMS

## 2022-06-01 ENCOUNTER — Telehealth: Payer: Self-pay

## 2022-06-01 NOTE — Telephone Encounter (Signed)
Transition Care Management Unsuccessful Follow-up Telephone Call  Date of discharge and from where:  05/30/22 [facility] ED. Dx: Eloped form ED before evaluation  Attempts:  1st Attempt  Reason for unsuccessful TCM follow-up call:  Voice mail full

## 2022-06-02 NOTE — Telephone Encounter (Signed)
Transition Care Management Unsuccessful Follow-up Telephone Call  Date of discharge and from where:  05/30/22 [facility] ED. Dx: Eloped form ED before evaluation   Attempts:  2nd Attempt  Reason for unsuccessful TCM follow-up call:  Left voice message

## 2022-06-03 ENCOUNTER — Ambulatory Visit: Payer: Medicare PPO | Admitting: Podiatry

## 2022-06-08 ENCOUNTER — Ambulatory Visit: Payer: Medicare PPO | Admitting: Podiatry

## 2022-06-08 ENCOUNTER — Encounter: Payer: Self-pay | Admitting: Podiatry

## 2022-06-08 DIAGNOSIS — M722 Plantar fascial fibromatosis: Secondary | ICD-10-CM | POA: Diagnosis not present

## 2022-06-08 NOTE — Progress Notes (Signed)
Subjective:   Patient ID: Bianca Medina, female   DOB: 72 y.o.   MRN: [id number]   HPI Patient states doing much better minimal discomfort   ROS      Objective:  Physical Exam  Neurovascular status intact with discomfort right plantar fascia that is improved quite dramatically mild discomfort only upon deep palpation     Assessment:  Planter fasciitis right doing much better     Plan:  H&P reviewed and went ahead today advised on support shoes anti-inflammatories as needed and patient will be seen back if any issues were to occur

## 2022-06-08 NOTE — Telephone Encounter (Signed)
Transition Care Management Unsuccessful Follow-up Telephone Call  Date of discharge and from where:  05/30/22 [facility] ED. Dx: Eloped form ED before evaluation   Attempts:  3rd Attempt  Reason for unsuccessful TCM follow-up call:  Voicemail full

## 2022-06-10 ENCOUNTER — Telehealth: Payer: Self-pay | Admitting: Family Medicine

## 2022-06-10 NOTE — Telephone Encounter (Signed)
Returned patients call, no answer unable to LM will call back.

## 2022-06-10 NOTE — Telephone Encounter (Signed)
Pt stated she feel left near mouth ,lower part of face pulling. Please call the pt to let her know what to do

## 2022-06-11 ENCOUNTER — Ambulatory Visit (INDEPENDENT_AMBULATORY_CARE_PROVIDER_SITE_OTHER): Payer: Medicare PPO | Admitting: Family Medicine

## 2022-06-11 ENCOUNTER — Encounter (INDEPENDENT_AMBULATORY_CARE_PROVIDER_SITE_OTHER): Payer: Self-pay | Admitting: Family Medicine

## 2022-06-11 VITALS — BP 141/82 | HR 70 | Temp 98.8°F | Ht 64.0 in

## 2022-06-11 DIAGNOSIS — Z7984 Long term (current) use of oral hypoglycemic drugs: Secondary | ICD-10-CM | POA: Diagnosis not present

## 2022-06-11 DIAGNOSIS — Z6841 Body Mass Index (BMI) 40.0 and over, adult: Secondary | ICD-10-CM | POA: Diagnosis not present

## 2022-06-11 DIAGNOSIS — E118 Type 2 diabetes mellitus with unspecified complications: Secondary | ICD-10-CM | POA: Diagnosis not present

## 2022-06-11 DIAGNOSIS — M25561 Pain in right knee: Secondary | ICD-10-CM

## 2022-06-11 DIAGNOSIS — E669 Obesity, unspecified: Secondary | ICD-10-CM | POA: Diagnosis not present

## 2022-06-11 DIAGNOSIS — F39 Unspecified mood [affective] disorder: Secondary | ICD-10-CM

## 2022-06-12 NOTE — Telephone Encounter (Signed)
Mailbox full unable to LM will call back.

## 2022-06-15 NOTE — Telephone Encounter (Signed)
Have tried calling patient several times, no answer each time unable to LM.

## 2022-06-16 ENCOUNTER — Encounter: Payer: Self-pay | Admitting: Family Medicine

## 2022-06-16 ENCOUNTER — Ambulatory Visit: Payer: Medicare PPO | Admitting: Family Medicine

## 2022-06-16 VITALS — BP 120/74 | HR 64 | Temp 97.3°F | Resp 16 | Ht 65.0 in | Wt 268.6 lb

## 2022-06-16 DIAGNOSIS — M79602 Pain in left arm: Secondary | ICD-10-CM

## 2022-06-16 DIAGNOSIS — E079 Disorder of thyroid, unspecified: Secondary | ICD-10-CM | POA: Diagnosis not present

## 2022-06-16 DIAGNOSIS — R29810 Facial weakness: Secondary | ICD-10-CM | POA: Diagnosis not present

## 2022-06-16 NOTE — Progress Notes (Signed)
Established Patient Office Visit   Subjective:  Patient ID: Bianca Medina, female    DOB: Jun 27, 1950  Age: 72 y.o. MRN: [id number]  Chief Complaint  Patient presents with   Facial drooping on left side of face     Pt c/o feeling of left side of face drooping.  Also, she has nerve pain in her left arm. This started last night     HPI Encounter Diagnoses  Name Primary?   Pain of left upper extremity Yes   Mass of thyroid gland    Facial droop    Presents for evaluation of a facial droop described as a difference in the position of  her left upper lip in relation to her upper teeth feels as opposed to the right upper lip and its relationship to the right upper teeth.  She denies headaches, difficulty swallowing or weakness on one side of her body and not the other.  Over the last few days she has been feeling a sharp pain in her left upper medial arm.   Review of Systems  Constitutional: Negative.   HENT: Negative.    Eyes:  Negative for blurred vision, discharge and redness.  Respiratory: Negative.    Cardiovascular: Negative.   Gastrointestinal:  Negative for abdominal pain.  Genitourinary: Negative.   Musculoskeletal: Negative.  Negative for joint pain and myalgias.  Skin:  Negative for rash.  Neurological:  Negative for dizziness, tingling, loss of consciousness, weakness and headaches.  Endo/Heme/Allergies:  Negative for polydipsia.      06/16/2022    3:17 PM 04/23/2022   10:52 AM 03/31/2022    3:17 PM  Depression screen PHQ 2/9  Decreased Interest 0 0 0  Down, Depressed, Hopeless 0 0 0  PHQ - 2 Score 0 0 0      Current Outpatient Medications:    triamcinolone cream (KENALOG) 0.1 %, Apply topically 2 (two) times daily., Disp: , Rfl:    aspirin EC 81 MG tablet, Take 81 mg by mouth daily. Swallow whole., Disp: , Rfl:    atorvastatin (LIPITOR) 10 MG tablet, Take 1 tablet (10 mg total) by mouth daily., Disp: 90 tablet, Rfl: 1   Biotin 5000 MCG CAPS, Take 5,000 mcg by  mouth daily. , Disp: , Rfl:    chlorthalidone (HYGROTON) 25 MG tablet, TAKE 1 TABLET (25 MG TOTAL) BY MOUTH DAILY., Disp: 90 tablet, Rfl: 1   fluticasone (FLONASE) 50 MCG/ACT nasal spray, Place 2 sprays into both nostrils daily., Disp: 16 g, Rfl: 6   ibuprofen (ADVIL) 600 MG tablet, Take 1 tablet (600 mg total) by mouth every 6 (six) hours as needed., Disp: 30 tablet, Rfl: 0   losartan (COZAAR) 25 MG tablet, Take 25 mg by mouth daily., Disp: , Rfl:    meloxicam (MOBIC) 15 MG tablet, Take 15 mg by mouth daily., Disp: , Rfl:    metFORMIN (GLUCOPHAGE-XR) 500 MG 24 hr tablet, TAKE 1 TABLET BY MOUTH EVERY DAY WITH BREAKFAST (Patient taking differently: Take 500 mg by mouth daily with breakfast.), Disp: 90 tablet, Rfl: 1   montelukast (SINGULAIR) 10 MG tablet, Take 10 mg by mouth daily., Disp: , Rfl:    Potassium Chloride ER 20 MEQ TBCR, Take 1 tablet by mouth daily., Disp: , Rfl:    PROAIR HFA 108 (90 Base) MCG/ACT inhaler, Inhale 1-2 puffs into the lungs every 4 (four) hours as needed for wheezing or shortness of breath. , Disp: , Rfl:    SYMBICORT 80-4.5 MCG/ACT inhaler, Inhale  2 puffs into the lungs 2 (two) times daily., Disp: , Rfl:    Vitamin D, Ergocalciferol, (DRISDOL) 1.25 MG (50000 UNIT) CAPS capsule, Take 50,000 Units by mouth once a week., Disp: , Rfl:    vitamin E 1000 UNIT capsule, Take 1,000 Units by mouth daily., Disp: , Rfl:    Objective:     BP 120/74 (BP Location: Right Arm, Patient Position: Sitting, Cuff Size: Large)   Pulse 64   Temp (!) 97.3 F (36.3 C) (Temporal)   Resp 16   Ht '5\' 5"'$  (1.651 m)   Wt 268 lb 9.6 oz (121.8 kg)   SpO2 96%   BMI 44.70 kg/m    Physical Exam Constitutional:      General: She is not in acute distress.    Appearance: Normal appearance. She is not ill-appearing, toxic-appearing or diaphoretic.  HENT:     Head: Normocephalic and atraumatic.     Right Ear: External ear normal.     Left Ear: External ear normal.     Mouth/Throat:     Mouth:  Mucous membranes are moist.     Pharynx: Oropharynx is clear. No oropharyngeal exudate or posterior oropharyngeal erythema.  Eyes:     General: No visual field deficit or scleral icterus.       Right eye: No discharge.        Left eye: No discharge.     Extraocular Movements: Extraocular movements intact.     Conjunctiva/sclera: Conjunctivae normal.  Cardiovascular:     Rate and Rhythm: Normal rate and regular rhythm.  Pulmonary:     Effort: Pulmonary effort is normal. No respiratory distress.     Breath sounds: Normal breath sounds.  Musculoskeletal:     Right shoulder: Normal.     Left shoulder: Normal.     Cervical back: No rigidity, tenderness or bony tenderness. No pain with movement. Normal range of motion.  Lymphadenopathy:     Cervical: No cervical adenopathy.  Skin:    General: Skin is warm and dry.     Comments: No rash noted on back or arm.  Neurological:     Mental Status: She is alert and oriented to person, place, and time.     Cranial Nerves: No cranial nerve deficit, dysarthria or facial asymmetry.     Comments: No facial droop visualized.  Psychiatric:        Mood and Affect: Mood normal.        Behavior: Behavior normal.      No results found for any visits on 06/16/22.    The 10-year ASCVD risk score (Arnett DK, et al., 2019) is: 19.5%    Assessment & Plan:   Pain of left upper extremity  Mass of thyroid gland -     [place] FNA BX THYROID 1ST LESION AFIRMA; Future  Facial droop    Return if symptoms worsen or fail to improve.  Cranial nerves intact.  No droop seen.  Please return with any changes.  Return if you develop any type of rash on your upper back or arm.  Agrees to go for fine-needle biopsy of thyroid nodule.  Libby Maw, MD

## 2022-06-16 NOTE — Telephone Encounter (Signed)
Spoke with patient scheduled appointment for patient to come in for evaluation of possible Bell's  Palsy.

## 2022-06-17 ENCOUNTER — Telehealth: Payer: Self-pay | Admitting: Family Medicine

## 2022-06-17 ENCOUNTER — Other Ambulatory Visit: Payer: Self-pay

## 2022-06-17 DIAGNOSIS — E079 Disorder of thyroid, unspecified: Secondary | ICD-10-CM

## 2022-06-17 NOTE — Telephone Encounter (Signed)
Returned patients call to info that order was actually for [place] and to let her know that someone will call to schedule. No answer unable to LM will call back.

## 2022-06-17 NOTE — Telephone Encounter (Signed)
Pt stated that Dr. Ethelene Hal gave her a written order for an xray. She has misplaced it and would like if you can send another through my chart. [phone]

## 2022-06-18 NOTE — Telephone Encounter (Signed)
Returned patients call no answer unable to [facility] mailbox full.

## 2022-06-19 NOTE — Telephone Encounter (Signed)
Patient scheduled for U/S

## 2022-06-23 ENCOUNTER — Ambulatory Visit
Admission: RE | Admit: 2022-06-23 | Discharge: 2022-06-23 | Disposition: A | Discharge status: Home / Self Care | Payer: Medicare PPO | Source: Ambulatory Visit | Attending: Family Medicine | Admitting: Family Medicine

## 2022-06-23 DIAGNOSIS — E079 Disorder of thyroid, unspecified: Secondary | ICD-10-CM

## 2022-06-23 DIAGNOSIS — E041 Nontoxic single thyroid nodule: Secondary | ICD-10-CM | POA: Diagnosis not present

## 2022-06-24 ENCOUNTER — Telehealth: Payer: Self-pay

## 2022-06-24 DIAGNOSIS — R002 Palpitations: Secondary | ICD-10-CM | POA: Diagnosis not present

## 2022-06-24 DIAGNOSIS — I10 Essential (primary) hypertension: Secondary | ICD-10-CM | POA: Diagnosis not present

## 2022-06-24 DIAGNOSIS — E119 Type 2 diabetes mellitus without complications: Secondary | ICD-10-CM | POA: Diagnosis not present

## 2022-06-24 DIAGNOSIS — E785 Hyperlipidemia, unspecified: Secondary | ICD-10-CM | POA: Diagnosis not present

## 2022-06-24 NOTE — Telephone Encounter (Signed)
Patient calling states that she would like to discuss ultrasound result with only Dr. Ethelene Hal. Appointment scheduled for virtual

## 2022-06-24 NOTE — Telephone Encounter (Signed)
Patient called and said she would like to speak to Dr Ethelene Hal about her [place] results. She said she would only like to speak to Dr Ethelene Hal. Does she need an appt or would he call her back? If call bak its [phone]

## 2022-06-24 NOTE — Telephone Encounter (Signed)
Appointment scheduled for virtual

## 2022-06-26 ENCOUNTER — Telehealth (INDEPENDENT_AMBULATORY_CARE_PROVIDER_SITE_OTHER): Payer: Medicare PPO | Admitting: Family Medicine

## 2022-06-26 ENCOUNTER — Encounter: Payer: Self-pay | Admitting: Family Medicine

## 2022-06-26 VITALS — Ht 65.0 in

## 2022-06-26 DIAGNOSIS — E041 Nontoxic single thyroid nodule: Secondary | ICD-10-CM | POA: Diagnosis not present

## 2022-06-26 DIAGNOSIS — I10 Essential (primary) hypertension: Secondary | ICD-10-CM | POA: Diagnosis not present

## 2022-06-26 DIAGNOSIS — I519 Heart disease, unspecified: Secondary | ICD-10-CM | POA: Diagnosis not present

## 2022-06-26 DIAGNOSIS — E785 Hyperlipidemia, unspecified: Secondary | ICD-10-CM | POA: Diagnosis not present

## 2022-06-26 DIAGNOSIS — E669 Obesity, unspecified: Secondary | ICD-10-CM | POA: Diagnosis not present

## 2022-06-26 DIAGNOSIS — E119 Type 2 diabetes mellitus without complications: Secondary | ICD-10-CM | POA: Diagnosis not present

## 2022-06-26 NOTE — Progress Notes (Signed)
Established Patient Office Visit   Subjective:  Patient ID: Bianca Medina, female    DOB: [birth year]  Age: 72 y.o. MRN: [id number]  Chief Complaint  Patient presents with   Advice Only    Discuss U/S    HPI Encounter Diagnoses  Name Primary?   Thyroid nodule Yes   Patient wanted to confirm that a biopsy was needed of the node right thyroid lobe.  It is scheduled for Tuesday.  Patient has normal thyroid function.   Review of Systems  Constitutional: Negative.   HENT: Negative.    Eyes:  Negative for blurred vision, discharge and redness.  Respiratory: Negative.    Cardiovascular: Negative.   Gastrointestinal:  Negative for abdominal pain.  Genitourinary: Negative.   Musculoskeletal: Negative.  Negative for myalgias.  Skin:  Negative for rash.  Neurological:  Negative for tingling, loss of consciousness and weakness.  Endo/Heme/Allergies:  Negative for polydipsia.     Current Outpatient Medications:    aspirin EC 81 MG tablet, Take 81 mg by mouth daily. Swallow whole., Disp: , Rfl:    atorvastatin (LIPITOR) 10 MG tablet, Take 1 tablet (10 mg total) by mouth daily., Disp: 90 tablet, Rfl: 1   Biotin 5000 MCG CAPS, Take 5,000 mcg by mouth daily. , Disp: , Rfl:    chlorthalidone (HYGROTON) 25 MG tablet, TAKE 1 TABLET (25 MG TOTAL) BY MOUTH DAILY., Disp: 90 tablet, Rfl: 1   fluticasone (FLONASE) 50 MCG/ACT nasal spray, Place 2 sprays into both nostrils daily., Disp: 16 g, Rfl: 6   ibuprofen (ADVIL) 600 MG tablet, Take 1 tablet (600 mg total) by mouth every 6 (six) hours as needed., Disp: 30 tablet, Rfl: 0   losartan (COZAAR) 25 MG tablet, Take 25 mg by mouth daily., Disp: , Rfl:    meloxicam (MOBIC) 15 MG tablet, Take 15 mg by mouth daily., Disp: , Rfl:    metFORMIN (GLUCOPHAGE-XR) 500 MG 24 hr tablet, TAKE 1 TABLET BY MOUTH EVERY DAY WITH BREAKFAST (Patient taking differently: Take 500 mg by mouth daily with breakfast.), Disp: 90 tablet, Rfl: 1   montelukast (SINGULAIR) 10 MG  tablet, Take 10 mg by mouth daily., Disp: , Rfl:    Potassium Chloride ER 20 MEQ TBCR, Take 1 tablet by mouth daily., Disp: , Rfl:    PROAIR HFA 108 (90 Base) MCG/ACT inhaler, Inhale 1-2 puffs into the lungs every 4 (four) hours as needed for wheezing or shortness of breath. , Disp: , Rfl:    SYMBICORT 80-4.5 MCG/ACT inhaler, Inhale 2 puffs into the lungs 2 (two) times daily., Disp: , Rfl:    triamcinolone cream (KENALOG) 0.1 %, Apply topically 2 (two) times daily., Disp: , Rfl:    Vitamin D, Ergocalciferol, (DRISDOL) 1.25 MG (50000 UNIT) CAPS capsule, Take 50,000 Units by mouth once a week., Disp: , Rfl:    vitamin E 1000 UNIT capsule, Take 1,000 Units by mouth daily., Disp: , Rfl:    Objective:     Ht 5' 5"$  (1.651 m)   BMI 44.70 kg/m    Physical Exam Constitutional:      General: She is not in acute distress.    Appearance: Normal appearance. She is not ill-appearing, toxic-appearing or diaphoretic.  HENT:     Head: Normocephalic and atraumatic.     Right Ear: External ear normal.     Left Ear: External ear normal.  Eyes:     General: No scleral icterus.       Right eye: No  discharge.        Left eye: No discharge.     Extraocular Movements: Extraocular movements intact.     Conjunctiva/sclera: Conjunctivae normal.  Pulmonary:     Effort: Pulmonary effort is normal. No respiratory distress.  Skin:    General: Skin is warm and dry.  Neurological:     Mental Status: She is alert and oriented to person, place, and time.  Psychiatric:        Mood and Affect: Mood normal.        Behavior: Behavior normal.      No results found for any visits on 06/26/22.    The 10-year ASCVD risk score (Arnett DK, et al., 2019) is: 19.5%    Assessment & Plan:   Thyroid nodule    Return in about 3 months (around 09/24/2022), or if symptoms worsen or fail to improve.    Libby Maw, MD  Virtual Visit via Video Note  I connected with Bianca Medina on 06/26/22 at  2:20  PM EST by a video enabled telemedicine application and verified that I am speaking with the correct person using two identifiers.  Location: Patient: home alone  Provider: work   I discussed the limitations of evaluation and management by telemedicine and the availability of in person appointments. The patient expressed understanding and agreed to proceed.  History of Present Illness:    Observations/Objective:   Assessment and Plan:   Follow Up Instructions:    I discussed the assessment and treatment plan with the patient. The patient was provided an opportunity to ask questions and all were answered. The patient agreed with the plan and demonstrated an understanding of the instructions.   The patient was advised to call back or seek an in-person evaluation if the symptoms worsen or if the condition fails to improve as anticipated.  I provided 15 minutes of non-face-to-face time during this encounter.   Libby Maw, MD

## 2022-06-29 NOTE — Progress Notes (Unsigned)
Office: [phone]  /  Fax: [phone]  Initial Visit  Bianca Medina was seen in clinic today to evaluate for obesity. She is interested in losing weight to improve overall health and reduce the risk of weight related complications. She presents today to review program treatment options, initial physical assessment, and evaluation.     She was referred by: PCP  When asked what else they would they like to accomplish? She states: Improve existing medical conditions and Lose a target amount of weight : 170  weight has slowly increased over time.  When asked how has your weight affected you? She states: Contributed to orthopedic problems or mobility issues and Having fatigue  Some associated conditions: Hypertension, Arthritis: , and Diabetes  Contributing factors: Medications, Stress, and Eating patterns  Weight promoting medications identified: Other: Yes- unsure  Current nutrition plan: None and Other: no eggs, few vegetables  Current level of physical activity: None and Other: used to enjoy walking but has had knee pain for over 3 months  Current or previous pharmacotherapy: Metformin  Response to medication: Never tried medications   Past medical history includes:   Past Medical History:  Diagnosis Date   Adenomatous colon polyp 2021   Anemia 2003   IRON DEFICIENCY    Asthma    Breast cancer ([place])    right breast   Chronic anal fissure    Common migraine 06/23/2019   Diabetes mellitus without complication (HCC)    GERD (gastroesophageal reflux disease)    Hearing loss    Hyperlipidemia, mild    Hypertension    IDA (iron deficiency anemia)    NSVD (normal spontaneous vaginal delivery)    X3   Obesity    PMDD (premenstrual dysphoric disorder)    Sensorineural hearing loss (SNHL) of both ears    Sleep disorder      Objective:   BP (!) 141/82   Pulse 70   Temp 98.8 F (37.1 C)   Ht 5' 4"$  (1.626 m)   SpO2 97%   BMI 46.17 kg/m  She was weighed on the  bioimpedance scale: Body mass index is 46.17 kg/m.  Visceral Fat Rating: 20, Body Fat%: 52.6  General:  Alert, oriented and cooperative. Patient is in no acute distress.  Respiratory: Normal respiratory effort, no problems with respiration noted  Extremities: Normal range of motion.    Mental Status: Normal mood and affect. Normal behavior. Normal judgment and thought content.   Assessment and Plan:  1. Type 2 diabetes mellitus with complication, without long-term current use of insulin (HCC) Mieisha is taking Metformin XR 500 mg daily. She c/o fatigue Family history of type 2 diabetes. Pt craves starches and sweets.  Plan: c\ Consider use of GLP-1 agonist.  2. Right knee pain, unspecified chronicity Vila is in physical therapy and reports a steroid injection, which did help. She used to enjoy walking at [facility]. Pt seeing ortho.  Plan: With knee pain improving, pt may resume walks at the park in proper sneakers.  3. Mood disorder ([place]) Stable. Amena denies emotional eating. She is not currently on mood medications.  Plan: Avoid use of medications for obesity with psychiatric side effects.  4. Obesity, current BMI 45.6 Reviewed bioimpedance results. Resume walks at the park.    Obesity Treatment Plan:  She will work on garnering support from family and friends to begin weight loss journey. Work on eliminating or reducing the presence of highly processed, calorie dense foods in the home. Complete provided  nutritional and psychosocial assessment questionnaire prior to her next visit.  Assess for cardiometabolic complications and nutritional deficiencies via fasting serologies.  Assess REE via indirect calorimetry to guide the creation of a reduced calorie, high protein meal plan to promote loss of fat mass.   She will think about ideas on how to incorporate physical activity in their daily routine.  DYMIN PILGER will follow up in the next 1-2 weeks to review the above  steps and continue evaluation and treatment. Counseled on nutritional approaches of weight management. Reviewed role of pharmacotherapy as an adjunct to nutritional weight management.  Obesity Education Performed Today:  She was weighed on the bioimpedance scale and results were discussed and documented in the synopsis.  We discussed obesity as a disease and the importance of a more detailed evaluation of all the factors contributing to the disease.  We discussed the importance of long term lifestyle changes which include nutrition, exercise and behavioral modifications as well as the importance of customizing this to her specific health and social needs.  We discussed the benefits of reaching a healthier weight to alleviate the symptoms of existing conditions and reduce the risks of the biomechanical, metabolic and psychological effects of obesity.  CHARLIE FOORE appears to be in the action stage of change and states they are ready to start intensive lifestyle modifications and behavioral modifications.  30 minutes was spent today on this visit including the above counseling, pre-visit chart review, and post-visit documentation.  I, Kathlene November, BS, CMA, am acting as transcriptionist for Loyal Gambler, DO.   I have reviewed the above documentation for accuracy and completeness, and I agree with the above. Dell Ponto, DO

## 2022-06-30 ENCOUNTER — Inpatient Hospital Stay
Admission: RE | Admit: 2022-06-30 | Discharge: 2022-06-30 | Disposition: A | Discharge status: Home / Self Care | Payer: Medicare PPO | Source: Ambulatory Visit | Attending: Family Medicine | Admitting: Family Medicine

## 2022-07-02 ENCOUNTER — Telehealth: Payer: Self-pay | Admitting: Family Medicine

## 2022-07-02 NOTE — Telephone Encounter (Signed)
Pt would like a call back regarding an appt with [facility].

## 2022-07-03 ENCOUNTER — Telehealth: Payer: Self-pay | Admitting: Family Medicine

## 2022-07-03 NOTE — Telephone Encounter (Signed)
Caller Name: Dri  Call back phone #: [phone]  Reason for Call: Dri called to say that after Roxey had her  thyroid US, a biopsy is not recommended.

## 2022-07-06 NOTE — Telephone Encounter (Signed)
Please advise message below  °

## 2022-07-07 NOTE — Telephone Encounter (Signed)
Returned patiens call no answer, unable to LM will call back.

## 2022-07-08 ENCOUNTER — Ambulatory Visit: Payer: Medicare PPO | Admitting: Podiatry

## 2022-07-08 ENCOUNTER — Encounter: Payer: Self-pay | Admitting: Podiatry

## 2022-07-08 DIAGNOSIS — M722 Plantar fascial fibromatosis: Secondary | ICD-10-CM | POA: Diagnosis not present

## 2022-07-08 MED ORDER — TRIAMCINOLONE ACETONIDE 10 MG/ML IJ SUSP
20.0000 mg | Freq: Once | INTRAMUSCULAR | Status: AC
  Administered 2022-07-08: 20 mg

## 2022-07-08 NOTE — Telephone Encounter (Signed)
Spoke with Elmyra Ricks at [facility] ultrasound informed of message from Dr. Ethelene Hal, they will reach out to biopsy team for approval/denial and give [place] a call back.

## 2022-07-08 NOTE — Progress Notes (Signed)
Subjective:   Patient ID: Bianca Medina, female   DOB: 72 y.o.   MRN: [id number]   HPI Patient states both heels are hurting she did have some improvement but the right remains very tender and the left is also partially tender.  States she does walk somewhat different due to the gait   ROS      Objective:  Physical Exam  Neurovascular status intact exquisite discomfort plantar heel right at the insertional point moderate left improvement from previous but still very tender     Assessment:  Fasciitis which remains very tender worse when she gets up in the morning after periods of sitting     Plan:  Reviewed condition and went ahead today sterile prep injected the fascia at insertion right and left 3 mg Kenalog 5 mg Xylocaine and dispensed night splint with all instructions on usage today

## 2022-07-14 ENCOUNTER — Ambulatory Visit: Payer: Medicare PPO | Admitting: Family Medicine

## 2022-07-14 DIAGNOSIS — K921 Melena: Secondary | ICD-10-CM | POA: Diagnosis not present

## 2022-07-14 DIAGNOSIS — Z86010 Personal history of colonic polyps: Secondary | ICD-10-CM | POA: Diagnosis not present

## 2022-07-14 DIAGNOSIS — K5900 Constipation, unspecified: Secondary | ICD-10-CM | POA: Diagnosis not present

## 2022-07-22 ENCOUNTER — Encounter: Payer: Self-pay | Admitting: Family Medicine

## 2022-07-22 ENCOUNTER — Ambulatory Visit: Payer: Medicare PPO | Admitting: Family Medicine

## 2022-07-22 VITALS — BP 122/74 | HR 65 | Temp 98.2°F | Ht 65.0 in | Wt 265.0 lb

## 2022-07-22 DIAGNOSIS — E041 Nontoxic single thyroid nodule: Secondary | ICD-10-CM

## 2022-07-22 DIAGNOSIS — R252 Cramp and spasm: Secondary | ICD-10-CM | POA: Diagnosis not present

## 2022-07-22 DIAGNOSIS — E559 Vitamin D deficiency, unspecified: Secondary | ICD-10-CM | POA: Diagnosis not present

## 2022-07-22 DIAGNOSIS — E560 Deficiency of vitamin E: Secondary | ICD-10-CM | POA: Diagnosis not present

## 2022-07-22 DIAGNOSIS — Z6841 Body Mass Index (BMI) 40.0 and over, adult: Secondary | ICD-10-CM | POA: Diagnosis not present

## 2022-07-22 LAB — VITAMIN D 25 HYDROXY (VIT D DEFICIENCY, FRACTURES): VITD: 31.6 ng/mL (ref 30.00–100.00)

## 2022-07-22 NOTE — Progress Notes (Signed)
Established Patient Office Visit   Subjective:  Patient ID: Bianca Medina, female    DOB: [birth year]  Age: 72 y.o. MRN: [id number]  Chief Complaint  Patient presents with   Referral    Patient would like referral to vein specialist concerns about darkening spots on left leg.     HPI Encounter Diagnoses  Name Primary?   Leg cramps Yes   Class 3 severe obesity due to excess calories with serious comorbidity and body mass index (BMI) of 40.0 to 44.9 in adult [facility])    Vitamin E deficiency    Vitamin D deficiency    Thyroid nodule    For follow-up of above.  She has been taking 20 mEq potassium chloride daily for leg cramps.  It is helpful.  She is also taking losartan.  She has been exercising by walking and is committed to healthy eating.  Continues vitamin D ENT replacement.  Thyroid biopsy is scheduled for tomorrow.   Review of Systems  Constitutional: Negative.   HENT: Negative.    Eyes:  Negative for blurred vision, discharge and redness.  Respiratory: Negative.    Cardiovascular: Negative.   Gastrointestinal:  Negative for abdominal pain.  Genitourinary: Negative.   Musculoskeletal: Negative.  Negative for myalgias.  Skin:  Negative for rash.  Neurological:  Negative for tingling, loss of consciousness and weakness.  Endo/Heme/Allergies:  Negative for polydipsia.     Current Outpatient Medications:    aspirin EC 81 MG tablet, Take 81 mg by mouth daily. Swallow whole., Disp: , Rfl:    atorvastatin (LIPITOR) 10 MG tablet, Take 1 tablet (10 mg total) by mouth daily., Disp: 90 tablet, Rfl: 1   Biotin 5000 MCG CAPS, Take 5,000 mcg by mouth daily. , Disp: , Rfl:    chlorthalidone (HYGROTON) 25 MG tablet, TAKE 1 TABLET (25 MG TOTAL) BY MOUTH DAILY., Disp: 90 tablet, Rfl: 1   fluticasone (FLONASE) 50 MCG/ACT nasal spray, Place 2 sprays into both nostrils daily., Disp: 16 g, Rfl: 6   ibuprofen (ADVIL) 600 MG tablet, Take 1 tablet (600 mg total) by mouth every 6 (six) hours as  needed., Disp: 30 tablet, Rfl: 0   losartan (COZAAR) 25 MG tablet, Take 25 mg by mouth daily., Disp: , Rfl:    meloxicam (MOBIC) 15 MG tablet, Take 15 mg by mouth daily., Disp: , Rfl:    metFORMIN (GLUCOPHAGE-XR) 500 MG 24 hr tablet, TAKE 1 TABLET BY MOUTH EVERY DAY WITH BREAKFAST (Patient taking differently: Take 500 mg by mouth daily with breakfast.), Disp: 90 tablet, Rfl: 1   montelukast (SINGULAIR) 10 MG tablet, Take 10 mg by mouth daily., Disp: , Rfl:    Potassium Chloride ER 20 MEQ TBCR, Take 1 tablet by mouth daily., Disp: , Rfl:    PROAIR HFA 108 (90 Base) MCG/ACT inhaler, Inhale 1-2 puffs into the lungs every 4 (four) hours as needed for wheezing or shortness of breath. , Disp: , Rfl:    SYMBICORT 80-4.5 MCG/ACT inhaler, Inhale 2 puffs into the lungs 2 (two) times daily., Disp: , Rfl:    triamcinolone cream (KENALOG) 0.1 %, Apply topically 2 (two) times daily., Disp: , Rfl:    vitamin E 1000 UNIT capsule, Take 1,000 Units by mouth daily., Disp: , Rfl:    Vitamin D, Ergocalciferol, (DRISDOL) 1.25 MG (50000 UNIT) CAPS capsule, Take 50,000 Units by mouth once a week. (Patient not taking: Reported on 07/22/2022), Disp: , Rfl:    Objective:     BP  122/74 (BP Location: Right Arm, Patient Position: Sitting, Cuff Size: Large)   Pulse 65   Temp 98.2 F (36.8 C) (Temporal)   Ht '5\' 5"'$  (1.651 m)   Wt 265 lb (120.2 kg)   SpO2 93%   BMI 44.10 kg/m    Physical Exam Constitutional:      General: She is not in acute distress.    Appearance: Normal appearance. She is not ill-appearing, toxic-appearing or diaphoretic.  HENT:     Head: Normocephalic and atraumatic.     Right Ear: External ear normal.     Left Ear: External ear normal.  Eyes:     General: No scleral icterus.       Right eye: No discharge.        Left eye: No discharge.     Extraocular Movements: Extraocular movements intact.     Conjunctiva/sclera: Conjunctivae normal.  Cardiovascular:     Pulses:          Dorsalis pedis  pulses are 1+ on the right side and 1+ on the left side.       Posterior tibial pulses are 1+ on the right side and 1+ on the left side.  Pulmonary:     Effort: Pulmonary effort is normal. No respiratory distress.  Skin:    General: Skin is warm and dry.  Neurological:     Mental Status: She is alert and oriented to person, place, and time.  Psychiatric:        Mood and Affect: Mood normal.        Behavior: Behavior normal.    Diabetic Foot Exam - Simple   Simple Foot Form Diabetic Foot exam was performed with the following findings: Yes 07/22/2022  8:27 AM  Visual Inspection See comments: Yes Sensation Testing See comments: Yes Pulse Check Posterior Tibialis and Dorsalis pulse intact bilaterally: Yes Comments There is mild scaling on the sole of the feet.  There are no open lesions or ulcerations.  Sensory is intact to light touch.      No results found for any visits on 07/22/22.    The 10-year ASCVD risk score (Arnett DK, et al., 2019) is: 20.1%    Assessment & Plan:   Leg cramps  Class 3 severe obesity due to excess calories with serious comorbidity and body mass index (BMI) of 40.0 to 44.9 in adult [facility])  Vitamin E deficiency -     Vitamin E  Vitamin D deficiency -     VITAMIN D 25 Hydroxy (Vit-D Deficiency, Fractures)  Thyroid nodule    Return in about 3 months (around 10/22/2022), or please use a skin moisturizer on your feet..  Rechecking vitamin A and vitamin D levels.  Advised her to use the potassium only as needed and no more than once or twice weekly.  Information was given on exercising to lose weight encouraged her to continue weight loss efforts.  Thyroid biopsy is tomorrow.    Libby Maw, MD

## 2022-07-23 ENCOUNTER — Ambulatory Visit: Payer: Medicare PPO | Admitting: Family Medicine

## 2022-07-23 ENCOUNTER — Other Ambulatory Visit: Payer: Medicare PPO

## 2022-07-26 LAB — VITAMIN E
Gamma-Tocopherol (Vit E): <1 mg/L (ref <4.4)
Vitamin E (Alpha Tocopherol): 14.9 mg/L (ref 5.7–19.9)

## 2022-07-30 ENCOUNTER — Telehealth: Payer: Self-pay | Admitting: Gastroenterology

## 2022-07-30 NOTE — Telephone Encounter (Signed)
Good Afternoon Dr. Havery Moros,   Patient called stating she wanted to switch care over to Dr. Melanee Left for Endoscopy and colonoscopy procedures. Are you okay with Dr.Cirigiliano  taking on patients care?   Please advise.

## 2022-07-30 NOTE — Telephone Encounter (Signed)
I have only seen her once in the past.  Up to Dr. Bryan Lemma if he wants to take her care or not, I am fine with that either way.  Of note I saw her for a second opinion as she is followed historically by Dr. Collene Mares.  Thanks

## 2022-08-03 ENCOUNTER — Ambulatory Visit (INDEPENDENT_AMBULATORY_CARE_PROVIDER_SITE_OTHER): Payer: Medicare PPO

## 2022-08-03 ENCOUNTER — Encounter (HOSPITAL_COMMUNITY): Payer: Self-pay | Admitting: Emergency Medicine

## 2022-08-03 ENCOUNTER — Ambulatory Visit (HOSPITAL_COMMUNITY)
Admission: EM | Admit: 2022-08-03 | Discharge: 2022-08-03 | Disposition: A | Discharge status: Home / Self Care | Payer: Medicare PPO | Attending: Family Medicine | Admitting: Family Medicine

## 2022-08-03 DIAGNOSIS — M79641 Pain in right hand: Secondary | ICD-10-CM | POA: Diagnosis not present

## 2022-08-03 DIAGNOSIS — M25551 Pain in right hip: Secondary | ICD-10-CM | POA: Diagnosis not present

## 2022-08-03 DIAGNOSIS — M79651 Pain in right thigh: Secondary | ICD-10-CM

## 2022-08-03 LAB — CBC
HCT: 39.4 % (ref 36.0–46.0)
Hemoglobin: 12.9 g/dL (ref 12.0–15.0)
MCH: 24.7 pg — ABNORMAL LOW (ref 26.0–34.0)
MCHC: 32.7 g/dL (ref 30.0–36.0)
MCV: 75.3 fL — ABNORMAL LOW (ref 80.0–100.0)
Platelets: 287 10*3/uL (ref 150–400)
RBC: 5.23 MIL/uL — ABNORMAL HIGH (ref 3.87–5.11)
RDW: 15.3 % (ref 11.5–15.5)
WBC: 12 10*3/uL — ABNORMAL HIGH (ref 4.0–10.5)
nRBC: 0 % (ref 0.0–0.2)

## 2022-08-03 LAB — BASIC METABOLIC PANEL
Anion gap: 13 (ref 5–15)
BUN: 16 mg/dL (ref 8–23)
CO2: 29 mmol/L (ref 22–32)
Calcium: 9.1 mg/dL (ref 8.9–10.3)
Chloride: 98 mmol/L (ref 98–111)
Creatinine, Ser: 0.85 mg/dL (ref 0.44–1.00)
GFR, Estimated: >60 mL/min (ref >60)
Glucose, Bld: 108 mg/dL — ABNORMAL HIGH (ref 70–99)
Potassium: 4.4 mmol/L (ref 3.5–5.1)
Sodium: 140 mmol/L (ref 135–145)
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
## 2022-08-03 NOTE — ED Provider Notes (Signed)
[facility]    CSN: [id number] Arrival date & time: 08/03/22  1454      History   Chief Complaint Chief Complaint  Patient presents with   Hand Pain    HPI Bianca Medina is a 72 y.o. female.    Hand Pain   Here for right hand pain and right proximal lateral thigh pain.  The pains are intermittent and can last 30 or 45 seconds.  The pain in her hand is mainly in her fingers.  They have not been swollen.  She also has noted pain in her proximal thigh on the lateral aspect.  That also is intermittent.  The pain in her hands been going on for about 6 or 7 days, and the pain in her thigh for about 3 or 4 days. No associated trauma.  No fever and no upper respiratory symptoms.  She brings up that she has had a couple of break-ins in her house and then brings up some concern about a possible poisoning.  She states that she was "told that may be" she should be concerned about a poisoning with melphelomine (SP?).  She states that she heard that maybe it had been placed in her grape jelly and her grape sodas.  She then stated that she is not supposed to be eating anything purple like grape jelly or grape soda, and she think she needs to throw it away.  Also she stopped taking her potassium about a week ago. Past Medical History:  Diagnosis Date   Adenomatous colon polyp 2021   Anemia 2003   IRON DEFICIENCY    Asthma    Breast cancer ([place])    right breast   Chronic anal fissure    Common migraine 06/23/2019   Diabetes mellitus without complication (HCC)    GERD (gastroesophageal reflux disease)    Hearing loss    Hyperlipidemia, mild    Hypertension    IDA (iron deficiency anemia)    NSVD (normal spontaneous vaginal delivery)    X3   Obesity    PMDD (premenstrual dysphoric disorder)    Sensorineural hearing loss (SNHL) of both ears    Sleep disorder     Patient Active Problem List   Diagnosis Date Noted   Vitamin E deficiency 07/22/2022   Leg cramps  07/22/2022   Facial droop 06/16/2022   Pain of left upper extremity 06/16/2022   Right knee pain 06/11/2022   Mood disorder ([place]) 06/11/2022   Diverticulitis 04/26/2022   Cellulitis 04/26/2022   Leukocytosis 04/26/2022   Invasive ductal carcinoma of breast ([place]) 04/26/2022   Type 2 diabetes mellitus with complication, without long-term current use of insulin ([place]) 04/26/2022   Slow transit constipation 01/26/2022   Left leg swelling 11/10/2021   Hyperlipidemia 11/10/2021   Bruising 06/11/2021   Other schizoaffective disorders ([place]) 04/30/2021   Thyroid nodule 04/30/2021   Screening for colon cancer 04/01/2021   Insect bite of right wrist 04/01/2021   Bite, insect 04/01/2021   Rash 03/28/2021   Iron deficiency 03/19/2021   Need for influenza vaccination 03/19/2021   Snoring 01/27/2021   Sleep related bruxism 01/27/2021   Gasping for breath 01/27/2021   Insomnia due to anxiety and fear 01/27/2021   Class 3 severe obesity due to excess calories with serious comorbidity and body mass index (BMI) of 40.0 to 44.9 in adult [facility]) 01/27/2021   Medicare annual wellness visit, subsequent 01/24/2021   Dehydration 12/26/2020   History of syncope 12/26/2020  Hospital discharge follow-up 12/26/2020   Healthcare maintenance 12/01/2020   Vitamin D deficiency 12/01/2020   Ear itching 06/27/2019   Common migraine 06/23/2019   History of anemia 03/31/2019   Pre-diabetes 03/31/2019   Viral syndrome 03/31/2019   Sensorineural hearing loss (SNHL), bilateral 08/12/2017   Tinnitus of both ears 08/12/2017   Morbid obesity ([place]) 12/28/2016   Left breast mass 07/19/2014   Breast tenderness in female 07/19/2014   Menopause 01/09/2014   Vaginal atrophy 01/09/2014   Skin lesion 12/23/2012   Essential hypertension 10/31/2012   Heartburn 10/31/2012    Past Surgical History:  Procedure Laterality Date   ABDOMINAL HYSTERECTOMY     BLADDER REPAIR     BREAST BIOPSY Right    2023   COLONOSCOPY   11/2019   1 hpp and 1 tubl adenoma   COLONOSCOPY WITH PROPOFOL N/A 12/03/2021   Procedure: COLONOSCOPY WITH PROPOFOL;  Surgeon: Robert Bellow, MD;  Location: [facility] ENDOSCOPY;  Service: Endoscopy;  Laterality: N/A;   GUM SURGERY  1980's   MANDIBLE FRACTURE SURGERY     TUBAL LIGATION      OB History     Gravida  3   Para  3   Term  3   Preterm      AB      Living  3      SAB      IAB      Ectopic      Multiple      Live Births               Home Medications    Prior to Admission medications   Medication Sig Start Date End Date Taking? Authorizing Provider  aspirin EC 81 MG tablet Take 81 mg by mouth daily. Swallow whole.    [provider]  atorvastatin (LIPITOR) 10 MG tablet Take 1 tablet (10 mg total) by mouth daily. 04/23/22   Libby Maw, MD  Biotin 5000 MCG CAPS Take 5,000 mcg by mouth daily.     [provider]  chlorthalidone (HYGROTON) 25 MG tablet TAKE 1 TABLET (25 MG TOTAL) BY MOUTH DAILY. 02/24/22   Libby Maw, MD  fluticasone (FLONASE) 50 MCG/ACT nasal spray Place 2 sprays into both nostrils daily. 05/13/21   Libby Maw, MD  ibuprofen (ADVIL) 600 MG tablet Take 1 tablet (600 mg total) by mouth every 6 (six) hours as needed. 03/18/22   Wilnette Kales, PA  losartan (COZAAR) 25 MG tablet Take 25 mg by mouth daily. 07/23/21   [provider]  meloxicam (MOBIC) 15 MG tablet Take 15 mg by mouth daily. 07/03/21   [provider]  metFORMIN (GLUCOPHAGE-XR) 500 MG 24 hr tablet TAKE 1 TABLET BY MOUTH EVERY DAY WITH BREAKFAST Patient taking differently: Take 500 mg by mouth daily with breakfast. 04/23/22   Libby Maw, MD  montelukast (SINGULAIR) 10 MG tablet Take 10 mg by mouth daily. 12/08/21   [provider]  Potassium Chloride ER 20 MEQ TBCR Take 1 tablet by mouth daily. 11/10/21   [provider]  PROAIR HFA 108 (90 Base) MCG/ACT inhaler Inhale 1-2 puffs  into the lungs every 4 (four) hours as needed for wheezing or shortness of breath.  03/16/18   [provider]  SYMBICORT 80-4.5 MCG/ACT inhaler Inhale 2 puffs into the lungs 2 (two) times daily. 12/08/21   [provider]  triamcinolone cream (KENALOG) 0.1 % Apply topically 2 (two) times daily. 05/13/22  [provider]  Vitamin D, Ergocalciferol, (DRISDOL) 1.25 MG (50000 UNIT) CAPS capsule Take 50,000 Units by mouth once a week. Patient not taking: Reported on 07/22/2022 11/29/21   [provider]  vitamin E 1000 UNIT capsule Take 1,000 Units by mouth daily.    [provider]    Family History Family History  Problem Relation Age of Onset   Cancer Mother        LUNG- SMOKER   Diabetes Father    Hypertension Father    Heart disease Father    Stroke Father    Breast cancer Sister        lates 20s   Cancer Paternal Aunt        COLON   Cancer Paternal Uncle        COLON    Social History Social History   Tobacco Use   Smoking status: Never   Smokeless tobacco: Never  Vaping Use   Vaping Use: Never used  Substance Use Topics   Alcohol use: Not Currently   Drug use: Never     Allergies   Codeine   Review of Systems Review of Systems   Physical Exam Triage Vital Signs ED Triage Vitals  Enc Vitals Group     BP 08/03/22 1629 (!) 146/68     Pulse Rate 08/03/22 1629 60     Resp 08/03/22 1629 17     Temp 08/03/22 1629 98.4 F (36.9 C)     Temp Source 08/03/22 1629 Oral     SpO2 08/03/22 1629 96 %     Weight --      Height --      Head Circumference --      Peak Flow --      Pain Score 08/03/22 1628 5     Pain Loc --      Pain Edu? --      Excl. in [place]? --    No data found.  Updated Vital Signs BP (!) 146/68 (BP Location: Right Arm)   Pulse 60   Temp 98.4 F (36.9 C) (Oral)   Resp 17   SpO2 96%   Visual Acuity Right Eye Distance:   Left Eye Distance:   Bilateral Distance:    Right Eye Near:   Left Eye  Near:    Bilateral Near:     Physical Exam Vitals reviewed.  Constitutional:      General: She is not in acute distress.    Appearance: She is not ill-appearing, toxic-appearing or diaphoretic.  HENT:     Mouth/Throat:     Mouth: Mucous membranes are moist.  Eyes:     Extraocular Movements: Extraocular movements intact.     Conjunctiva/sclera: Conjunctivae normal.     Pupils: Pupils are equal, round, and reactive to light.  Cardiovascular:     Rate and Rhythm: Normal rate and regular rhythm.     Heart sounds: No murmur heard. Pulmonary:     Effort: Pulmonary effort is normal.     Breath sounds: Normal breath sounds.  Musculoskeletal:        General: No swelling or tenderness.     Cervical back: Neck supple.     Right lower leg: No edema.     Left lower leg: No edema.  Lymphadenopathy:     Cervical: No cervical adenopathy.  Skin:    Coloration: Skin is not jaundiced or pale.  Neurological:     General: No focal deficit present.  Mental Status: She is alert and oriented to person, place, and time.  Psychiatric:        Behavior: Behavior normal.      UC Treatments / Results  Labs (all labs ordered are listed, but only abnormal results are displayed) Labs Reviewed - No data to display  EKG   Radiology No results found.  Procedures Procedures (including critical care time)  Medications Ordered in UC Medications - No data to display  Initial Impression / Assessment and Plan / UC Course  I have reviewed the triage vital signs and the nursing notes.  Pertinent labs & imaging results that were available during my care of the patient were reviewed by me and considered in my medical decision making (see chart for details).       X-rays of the hand and of the hip show some arthritic changes in both sites, but not necessarily anything to explain the pain she is having.  Lab work is drawn to check her electrolytes and blood counts.  Staff will notify her if there  is anything that needs treatment on the labs.  Final Clinical Impressions(s) / UC Diagnoses   Final diagnoses:  None   Discharge Instructions   None    ED Prescriptions   None    PDMP not reviewed this encounter.   Barrett Henle, MD 08/03/22 [phone]

## 2022-08-03 NOTE — Discharge Instructions (Addendum)
The x-rays did not show any broken bones.  There was some arthritis in both your hand and in the hip.  Blood work has been drawn to check your blood counts and to check your sugar and electrolytes like sodium and potassium.  Our staff will notify you if anything needs treatment.   please follow-up with your primary care

## 2022-08-04 DIAGNOSIS — E119 Type 2 diabetes mellitus without complications: Secondary | ICD-10-CM | POA: Diagnosis not present

## 2022-08-04 LAB — HM DIABETES EYE EXAM

## 2022-08-05 ENCOUNTER — Ambulatory Visit
Admission: RE | Admit: 2022-08-05 | Discharge: 2022-08-05 | Disposition: A | Discharge status: Home / Self Care | Payer: Medicare PPO | Source: Ambulatory Visit | Attending: Family Medicine | Admitting: Family Medicine

## 2022-08-05 ENCOUNTER — Other Ambulatory Visit (HOSPITAL_COMMUNITY)
Admission: RE | Admit: 2022-08-05 | Discharge: 2022-08-05 | Disposition: A | Discharge status: Home / Self Care | Payer: Medicare PPO | Source: Ambulatory Visit | Attending: Interventional Radiology | Admitting: Interventional Radiology

## 2022-08-05 DIAGNOSIS — E079 Disorder of thyroid, unspecified: Secondary | ICD-10-CM | POA: Diagnosis not present

## 2022-08-05 DIAGNOSIS — E041 Nontoxic single thyroid nodule: Secondary | ICD-10-CM | POA: Diagnosis not present

## 2022-08-06 ENCOUNTER — Other Ambulatory Visit: Payer: Self-pay | Admitting: Family Medicine

## 2022-08-06 DIAGNOSIS — E559 Vitamin D deficiency, unspecified: Secondary | ICD-10-CM

## 2022-08-07 ENCOUNTER — Emergency Department (HOSPITAL_COMMUNITY)
Admission: EM | Admit: 2022-08-07 | Discharge: 2022-08-07 | Disposition: A | Discharge status: Home / Self Care | Payer: Medicare PPO | Attending: Emergency Medicine | Admitting: Emergency Medicine

## 2022-08-07 ENCOUNTER — Encounter (HOSPITAL_COMMUNITY): Payer: Self-pay

## 2022-08-07 DIAGNOSIS — R5383 Other fatigue: Secondary | ICD-10-CM | POA: Diagnosis not present

## 2022-08-07 DIAGNOSIS — M79662 Pain in left lower leg: Secondary | ICD-10-CM | POA: Diagnosis not present

## 2022-08-07 DIAGNOSIS — M79604 Pain in right leg: Secondary | ICD-10-CM

## 2022-08-07 DIAGNOSIS — E119 Type 2 diabetes mellitus without complications: Secondary | ICD-10-CM | POA: Insufficient documentation

## 2022-08-07 DIAGNOSIS — R718 Other abnormality of red blood cells: Secondary | ICD-10-CM | POA: Insufficient documentation

## 2022-08-07 DIAGNOSIS — M79651 Pain in right thigh: Secondary | ICD-10-CM | POA: Insufficient documentation

## 2022-08-07 DIAGNOSIS — M79652 Pain in left thigh: Secondary | ICD-10-CM | POA: Diagnosis not present

## 2022-08-07 DIAGNOSIS — I10 Essential (primary) hypertension: Secondary | ICD-10-CM | POA: Diagnosis not present

## 2022-08-07 DIAGNOSIS — M25562 Pain in left knee: Secondary | ICD-10-CM | POA: Diagnosis not present

## 2022-08-07 DIAGNOSIS — M79605 Pain in left leg: Secondary | ICD-10-CM | POA: Diagnosis not present

## 2022-08-07 DIAGNOSIS — Z7984 Long term (current) use of oral hypoglycemic drugs: Secondary | ICD-10-CM | POA: Insufficient documentation

## 2022-08-07 DIAGNOSIS — Z7982 Long term (current) use of aspirin: Secondary | ICD-10-CM | POA: Diagnosis not present

## 2022-08-07 DIAGNOSIS — R531 Weakness: Secondary | ICD-10-CM | POA: Diagnosis not present

## 2022-08-07 DIAGNOSIS — M25561 Pain in right knee: Secondary | ICD-10-CM | POA: Diagnosis not present

## 2022-08-07 LAB — CBC WITH DIFFERENTIAL/PLATELET
Abs Immature Granulocytes: 0.03 10*3/uL (ref 0.00–0.07)
Basophils Absolute: 0.1 10*3/uL (ref 0.0–0.1)
Basophils Relative: 1 %
Eosinophils Absolute: 0.2 10*3/uL (ref 0.0–0.5)
Eosinophils Relative: 2 %
HCT: 39.6 % (ref 36.0–46.0)
Hemoglobin: 12.7 g/dL (ref 12.0–15.0)
Immature Granulocytes: 0 %
Lymphocytes Relative: 22 %
Lymphs Abs: 2.4 10*3/uL (ref 0.7–4.0)
MCH: 24.4 pg — ABNORMAL LOW (ref 26.0–34.0)
MCHC: 32.1 g/dL (ref 30.0–36.0)
MCV: 76.2 fL — ABNORMAL LOW (ref 80.0–100.0)
Monocytes Absolute: 0.7 10*3/uL (ref 0.1–1.0)
Monocytes Relative: 7 %
Neutro Abs: 7.5 10*3/uL (ref 1.7–7.7)
Neutrophils Relative %: 68 %
Platelets: 151 10*3/uL (ref 150–400)
RBC: 5.2 MIL/uL — ABNORMAL HIGH (ref 3.87–5.11)
RDW: 15.5 % (ref 11.5–15.5)
WBC: 10.9 10*3/uL — ABNORMAL HIGH (ref 4.0–10.5)
nRBC: 0 % (ref 0.0–0.2)

## 2022-08-07 LAB — CYTOLOGY - NON PAP

## 2022-08-07 LAB — PHOSPHORUS: Phosphorus: 2.9 mg/dL (ref 2.5–4.6)

## 2022-08-07 LAB — COMPREHENSIVE METABOLIC PANEL
ALT: 16 U/L (ref 0–44)
AST: 15 U/L (ref 15–41)
Albumin: 3 g/dL — ABNORMAL LOW (ref 3.5–5.0)
Alkaline Phosphatase: 87 U/L (ref 38–126)
Anion gap: 11 (ref 5–15)
BUN: 14 mg/dL (ref 8–23)
CO2: 27 mmol/L (ref 22–32)
Calcium: 8.8 mg/dL — ABNORMAL LOW (ref 8.9–10.3)
Chloride: 101 mmol/L (ref 98–111)
Creatinine, Ser: 0.65 mg/dL (ref 0.44–1.00)
GFR, Estimated: >60 mL/min (ref >60)
Glucose, Bld: 107 mg/dL — ABNORMAL HIGH (ref 70–99)
Potassium: 3.4 mmol/L — ABNORMAL LOW (ref 3.5–5.1)
Sodium: 139 mmol/L (ref 135–145)
Total Bilirubin: 0.7 mg/dL (ref 0.3–1.2)
Total Protein: 6.4 g/dL — ABNORMAL LOW (ref 6.5–8.1)

## 2022-08-07 LAB — VITAMIN B12: Vitamin B-12: 556 pg/mL (ref 180–914)

## 2022-08-07 LAB — MAGNESIUM: Magnesium: 1.8 mg/dL (ref 1.7–2.4)

## 2022-08-07 MED ORDER — POTASSIUM CHLORIDE CRYS ER 20 MEQ PO TBCR
40.0000 meq | EXTENDED_RELEASE_TABLET | Freq: Once | ORAL | Status: AC
  Administered 2022-08-07: 40 meq via ORAL
  Filled 2022-08-07: qty 2

## 2022-08-07 NOTE — Discharge Instructions (Addendum)
Your labs overall were unremarkable for any electrolyte abnormalities or causes for the burning pain in your legs or weakness.  We do not see signs of blood clot, infection and have low suspicion for stroke or back pain emergency.  Please follow up with PT as scheduled, your PCP and given you do not have an orthopedic doctor we have given you a number to follow up with orthopedics regarding your knee pain.

## 2022-08-07 NOTE — ED Provider Notes (Signed)
[facility] Provider Note   CSN: [id number] Arrival date & time: 08/07/22  1744     History {Add pertinent medical, surgical, social history, OB history to HPI:1} Chief Complaint  Patient presents with  . Weakness  . Leg Pain    Bianca Medina is a 73 y.o. female with PMH HTN, HLD, T2DM presenting c/o bilateral leg weakness, bilateral thigh and knee pain, and lower L leg pain x 1.5 weeks; denies known injury    Weakness Leg Pain      Home Medications Prior to Admission medications   Medication Sig Start Date End Date Taking? Authorizing Provider  aspirin EC 81 MG tablet Take 81 mg by mouth daily. Swallow whole.    [provider]  atorvastatin (LIPITOR) 10 MG tablet Take 1 tablet (10 mg total) by mouth daily. 04/23/22   Libby Maw, MD  Biotin 5000 MCG CAPS Take 5,000 mcg by mouth daily.     [provider]  chlorthalidone (HYGROTON) 25 MG tablet TAKE 1 TABLET (25 MG TOTAL) BY MOUTH DAILY. 02/24/22   Libby Maw, MD  fluticasone (FLONASE) 50 MCG/ACT nasal spray Place 2 sprays into both nostrils daily. 05/13/21   Libby Maw, MD  losartan (COZAAR) 25 MG tablet Take 25 mg by mouth daily. 07/23/21   [provider]  meloxicam (MOBIC) 15 MG tablet Take 15 mg by mouth daily. 07/03/21   [provider]  metFORMIN (GLUCOPHAGE-XR) 500 MG 24 hr tablet TAKE 1 TABLET BY MOUTH EVERY DAY WITH BREAKFAST Patient taking differently: Take 500 mg by mouth daily with breakfast. 04/23/22   Libby Maw, MD  montelukast (SINGULAIR) 10 MG tablet Take 10 mg by mouth daily. 12/08/21   [provider]  Potassium Chloride ER 20 MEQ TBCR Take 1 tablet by mouth daily. 11/10/21   [provider]  PROAIR HFA 108 (90 Base) MCG/ACT inhaler Inhale 1-2 puffs into the lungs every 4 (four) hours as needed for wheezing or shortness of breath.  03/16/18   [provider]   SYMBICORT 80-4.5 MCG/ACT inhaler Inhale 2 puffs into the lungs 2 (two) times daily. 12/08/21   [provider]  triamcinolone cream (KENALOG) 0.1 % Apply topically 2 (two) times daily. 05/13/22   [provider]  Vitamin D, Ergocalciferol, (DRISDOL) 1.25 MG (50000 UNIT) CAPS capsule TAKE 1 CAPSULE BY MOUTH ONE TIME PER WEEK 08/06/22   Libby Maw, MD  vitamin E 1000 UNIT capsule Take 1,000 Units by mouth daily.    [provider]      Allergies    Codeine    Review of Systems   Review of Systems  Neurological:  Positive for weakness.    Physical Exam Updated Vital Signs BP 138/77   Pulse 79   Temp 98.3 F (36.8 C) (Oral)   Resp 16   SpO2 98%  Physical Exam  ED Results / Procedures / Treatments   Labs (all labs ordered are listed, but only abnormal results are displayed) Labs Reviewed - No data to display  EKG None  Radiology No results found.  Procedures Procedures  {Document cardiac monitor, telemetry assessment procedure when appropriate:1}  Medications Ordered in ED Medications - No data to display  ED Course/ Medical Decision Making/ A&P   {   Click here for ABCD2, HEART and other calculatorsREFRESH Note before signing :1}  Medical Decision Making Amount and/or Complexity of Data Reviewed Labs: ordered.   ***  {Document critical care time when appropriate:1} {Document review of labs and clinical decision tools ie heart score, Chads2Vasc2 etc:1}  {Document your independent review of radiology images, and any outside records:1} {Document your discussion with family members, caretakers, and with consultants:1} {Document social determinants of health affecting pt's care:1} {Document your decision making why or why not admission, treatments were needed:1} Final Clinical Impression(s) / ED Diagnoses Final diagnoses:  None    Rx / DC Orders ED Discharge Orders     None

## 2022-08-07 NOTE — ED Triage Notes (Signed)
Pt c/o bilateral leg weakness, bilateral thigh and knee pain, and lower L leg pain x 1.5 weeks; denies known injury; pt ambulatory at triage; alert, oriented, NAD; taking ibuprofen at home with some relief

## 2022-08-07 NOTE — ED Notes (Signed)
Pt up and ambulated to the bathroom. No issues with ambulation. Ambulated there and back with no assistance. Requested some warm blankets. Some provided and vitals entered.

## 2022-08-10 ENCOUNTER — Telehealth: Payer: Self-pay | Admitting: Family Medicine

## 2022-08-10 NOTE — Telephone Encounter (Signed)
Caller Name: Gabriana Call back phone #: [phone]  Reason for Call: Pt was seen in ED on 3/22 and an appt was made for ortho to Dr Jean Rosenthal. This appt isn't until 4/17 She would like an appt sooner if possible even if the Dr needs to change. This was not in her referrals but maybe the change needs to be a referral.

## 2022-08-10 NOTE — Telephone Encounter (Signed)
Patient calling to see if appointment to ortho could be moved up from 4/17 appointment. Please

## 2022-08-11 DIAGNOSIS — N644 Mastodynia: Secondary | ICD-10-CM | POA: Diagnosis not present

## 2022-08-11 NOTE — Telephone Encounter (Signed)
Called patient for clarification of request of referral, no answer LMTCB to go over appointment and possible referral.

## 2022-08-12 DIAGNOSIS — R262 Difficulty in walking, not elsewhere classified: Secondary | ICD-10-CM | POA: Diagnosis not present

## 2022-08-12 DIAGNOSIS — M25561 Pain in right knee: Secondary | ICD-10-CM | POA: Diagnosis not present

## 2022-08-12 DIAGNOSIS — M25551 Pain in right hip: Secondary | ICD-10-CM | POA: Diagnosis not present

## 2022-08-13 ENCOUNTER — Other Ambulatory Visit: Payer: Self-pay | Admitting: Obstetrics and Gynecology

## 2022-08-13 DIAGNOSIS — N644 Mastodynia: Secondary | ICD-10-CM

## 2022-08-15 ENCOUNTER — Ambulatory Visit (HOSPITAL_COMMUNITY)
Admission: EM | Admit: 2022-08-15 | Discharge: 2022-08-15 | Disposition: A | Discharge status: Home / Self Care | Payer: Medicare PPO | Attending: Internal Medicine | Admitting: Internal Medicine

## 2022-08-15 ENCOUNTER — Encounter (HOSPITAL_COMMUNITY): Payer: Self-pay | Admitting: Emergency Medicine

## 2022-08-15 DIAGNOSIS — M62838 Other muscle spasm: Secondary | ICD-10-CM | POA: Diagnosis not present

## 2022-08-15 DIAGNOSIS — I872 Venous insufficiency (chronic) (peripheral): Secondary | ICD-10-CM | POA: Diagnosis not present

## 2022-08-15 DIAGNOSIS — M25561 Pain in right knee: Secondary | ICD-10-CM | POA: Diagnosis not present

## 2022-08-15 DIAGNOSIS — G8929 Other chronic pain: Secondary | ICD-10-CM | POA: Diagnosis not present

## 2022-08-15 MED ORDER — TIZANIDINE HCL 2 MG PO CAPS: 2.0000 mg | ORAL_CAPSULE | Freq: Three times a day (TID) | ORAL | 0 refills | Status: DC

## 2022-08-15 NOTE — ED Triage Notes (Signed)
Pt reports got a cream for her skin on left leg from dermatology, but now skin on RLE is starting to change as well.  Reports pain in right groin-cramps, taking OTC leg cramp medications that has helped some.   Pain and swelling to right knee that gotten worse over the past week and half.  Denies falls or injury.   Reports right leg starting to drag since Thursday.

## 2022-08-15 NOTE — ED Provider Notes (Signed)
[place]    CSN: [id number] Arrival date & time: 08/15/22  1004      History   Chief Complaint Chief Complaint  Patient presents with   Knee Pain   Leg Swelling    HPI Bianca Medina is a 72 y.o. female.   Patient presents to urgent care for evaluation of acute exacerbation of chronic right knee pain that has been worsening over the last few days since she had physical therapy for the first time in several months for her chronic right knee pain and weakness to the right lower extremity.  She started physical therapy last year but stopped in November 2023.  She just started physical therapy again 2 days ago on Thursday, August 13, 2022 and states that she has been experiencing muscle spasm to the right lower extremity and right lower extremity weakness to the thigh/knee over the last couple of days.  She has been using Tylenol/ibuprofen as needed for pain and states this helps a little bit.  Pain is described as a cramping sensation to the right upper thigh and posterior right knee.  No recent trauma, injuries, or numbness or tingling to the bilateral lower extremities.  No low back pain, urinary symptoms, fever/chills, redness/warmth to the knees, or changes in gait.  Denies recent long periods of travel/sitting, calf pain, or history of DVT.  No chest pain, heart palpitations, shortness of breath, or cough.  Received workup for right leg weakness in the emergency department 8 days ago on August 07, 2022 and was found to have normal labs without hypokalemia (chart review).  She would also like to be evaluated for skin changes to the bilateral lower extremities/ankles that started many months ago.  She has been seen by dermatologist in the past who prescribed triamcinolone cream for dry skin to the ankles.  Patient was using this every day for approximately 1 month when it was prescribed to 2 months ago.  She started noticing worsening symptoms a couple of weeks ago and started using  triamcinolone cream intermittently for dry skin.  She denies itching to the ankles and swelling.  No pain to the ankles or recent trauma/injuries.  No erythema or warmth reported.  No numbness or tingling distally.  She does not have a history of varicose veins.  Has not been back to see the dermatologist in a couple of months.   Knee Pain   Past Medical History:  Diagnosis Date   Adenomatous colon polyp 2021   Anemia 2003   IRON DEFICIENCY    Asthma    Breast cancer ([place])    right breast   Chronic anal fissure    Common migraine 06/23/2019   Diabetes mellitus without complication (HCC)    GERD (gastroesophageal reflux disease)    Hearing loss    Hyperlipidemia, mild    Hypertension    IDA (iron deficiency anemia)    NSVD (normal spontaneous vaginal delivery)    X3   Obesity    PMDD (premenstrual dysphoric disorder)    Sensorineural hearing loss (SNHL) of both ears    Sleep disorder     Patient Active Problem List   Diagnosis Date Noted   Vitamin E deficiency 07/22/2022   Leg cramps 07/22/2022   Facial droop 06/16/2022   Pain of left upper extremity 06/16/2022   Right knee pain 06/11/2022   Mood disorder ([place]) 06/11/2022   Diverticulitis 04/26/2022   Cellulitis 04/26/2022   Leukocytosis 04/26/2022   Invasive ductal carcinoma of  breast ([place]) 04/26/2022   Type 2 diabetes mellitus with complication, without long-term current use of insulin ([place]) 04/26/2022   Slow transit constipation 01/26/2022   Left leg swelling 11/10/2021   Hyperlipidemia 11/10/2021   Bruising 06/11/2021   Other schizoaffective disorders ([place]) 04/30/2021   Thyroid nodule 04/30/2021   Screening for colon cancer 04/01/2021   Insect bite of right wrist 04/01/2021   Bite, insect 04/01/2021   Rash 03/28/2021   Iron deficiency 03/19/2021   Need for influenza vaccination 03/19/2021   Snoring 01/27/2021   Sleep related bruxism 01/27/2021   Gasping for breath 01/27/2021   Insomnia due to anxiety and  fear 01/27/2021   Class 3 severe obesity due to excess calories with serious comorbidity and body mass index (BMI) of 40.0 to 44.9 in adult [facility]) 01/27/2021   Medicare annual wellness visit, subsequent 01/24/2021   Dehydration 12/26/2020   History of syncope 12/26/2020   Hospital discharge follow-up 12/26/2020   Healthcare maintenance 12/01/2020   Vitamin D deficiency 12/01/2020   Ear itching 06/27/2019   Common migraine 06/23/2019   History of anemia 03/31/2019   Pre-diabetes 03/31/2019   Viral syndrome 03/31/2019   Sensorineural hearing loss (SNHL), bilateral 08/12/2017   Tinnitus of both ears 08/12/2017   Morbid obesity ([place]) 12/28/2016   Left breast mass 07/19/2014   Breast tenderness in female 07/19/2014   Menopause 01/09/2014   Vaginal atrophy 01/09/2014   Skin lesion 12/23/2012   Essential hypertension 10/31/2012   Heartburn 10/31/2012    Past Surgical History:  Procedure Laterality Date   ABDOMINAL HYSTERECTOMY     BLADDER REPAIR     BREAST BIOPSY Right    2023   COLONOSCOPY  11/2019   1 hpp and 1 tubl adenoma   COLONOSCOPY WITH PROPOFOL N/A 12/03/2021   Procedure: COLONOSCOPY WITH PROPOFOL;  Surgeon: Robert Bellow, MD;  Location: [facility] ENDOSCOPY;  Service: Endoscopy;  Laterality: N/A;   GUM SURGERY  1980's   MANDIBLE FRACTURE SURGERY     TUBAL LIGATION      OB History     Gravida  3   Para  3   Term  3   Preterm      AB      Living  3      SAB      IAB      Ectopic      Multiple      Live Births               Home Medications    Prior to Admission medications   Medication Sig Start Date End Date Taking? Authorizing Provider  tizanidine (ZANAFLEX) 2 MG capsule Take 1 capsule (2 mg total) by mouth 3 (three) times daily. 08/15/22  Yes Talbot Grumbling, FNP  aspirin EC 81 MG tablet Take 81 mg by mouth daily. Swallow whole.    [provider]  atorvastatin (LIPITOR) 10 MG tablet Take 1 tablet (10 mg total) by mouth  daily. 04/23/22   Libby Maw, MD  Biotin 5000 MCG CAPS Take 5,000 mcg by mouth daily.     [provider]  chlorthalidone (HYGROTON) 25 MG tablet TAKE 1 TABLET (25 MG TOTAL) BY MOUTH DAILY. 02/24/22   Libby Maw, MD  fluticasone (FLONASE) 50 MCG/ACT nasal spray Place 2 sprays into both nostrils daily. 05/13/21   Libby Maw, MD  losartan (COZAAR) 25 MG tablet Take 25 mg by mouth daily. 07/23/21   [provider]  meloxicam (MOBIC) 15 MG tablet Take 15 mg by mouth daily. 07/03/21   [provider]  metFORMIN (GLUCOPHAGE-XR) 500 MG 24 hr tablet TAKE 1 TABLET BY MOUTH EVERY DAY WITH BREAKFAST Patient taking differently: Take 500 mg by mouth daily with breakfast. 04/23/22   Libby Maw, MD  montelukast (SINGULAIR) 10 MG tablet Take 10 mg by mouth daily. 12/08/21   [provider]  Potassium Chloride ER 20 MEQ TBCR Take 1 tablet by mouth daily. 11/10/21   [provider]  PROAIR HFA 108 (90 Base) MCG/ACT inhaler Inhale 1-2 puffs into the lungs every 4 (four) hours as needed for wheezing or shortness of breath.  03/16/18   [provider]  SYMBICORT 80-4.5 MCG/ACT inhaler Inhale 2 puffs into the lungs 2 (two) times daily. 12/08/21   [provider]  triamcinolone cream (KENALOG) 0.1 % Apply topically 2 (two) times daily. 05/13/22   [provider]  Vitamin D, Ergocalciferol, (DRISDOL) 1.25 MG (50000 UNIT) CAPS capsule TAKE 1 CAPSULE BY MOUTH ONE TIME PER WEEK 08/06/22   Libby Maw, MD  vitamin E 1000 UNIT capsule Take 1,000 Units by mouth daily.    [provider]    Family History Family History  Problem Relation Age of Onset   Cancer Mother        LUNG- SMOKER   Diabetes Father    Hypertension Father    Heart disease Father    Stroke Father    Breast cancer Sister        lates 47s   Cancer Paternal Aunt        COLON   Cancer Paternal Uncle        COLON     Social History Social History   Tobacco Use   Smoking status: Never   Smokeless tobacco: Never  Vaping Use   Vaping Use: Never used  Substance Use Topics   Alcohol use: Not Currently   Drug use: Never     Allergies   Codeine   Review of Systems Review of Systems Per HPI  Physical Exam Triage Vital Signs ED Triage Vitals  Enc Vitals Group     BP 08/15/22 1030 137/73     Pulse Rate 08/15/22 1030 70     Resp 08/15/22 1030 17     Temp 08/15/22 1030 97.9 F (36.6 C)     Temp Source 08/15/22 1030 Oral     SpO2 08/15/22 1030 96 %     Weight --      Height --      Head Circumference --      Peak Flow --      Pain Score 08/15/22 1029 0     Pain Loc --      Pain Edu? --      Excl. in [place]? --    No data found.  Updated Vital Signs BP 137/73 (BP Location: Right Arm)   Pulse 70   Temp 97.9 F (36.6 C) (Oral)   Resp 17   SpO2 96%   Visual Acuity Right Eye Distance:   Left Eye Distance:   Bilateral Distance:    Right Eye Near:   Left Eye Near:    Bilateral Near:     Physical Exam Vitals and nursing note reviewed.  Constitutional:      Appearance: She is not ill-appearing or toxic-appearing.  HENT:     Head: Normocephalic and atraumatic.     Right Ear: Hearing, tympanic membrane, ear  canal and external ear normal.     Left Ear: Hearing, tympanic membrane, ear canal and external ear normal.     Nose: Nose normal.     Mouth/Throat:     Lips: Pink.     Mouth: Mucous membranes are moist.     Pharynx: No posterior oropharyngeal erythema.  Eyes:     General: Lids are normal. Vision grossly intact. Gaze aligned appropriately.     Extraocular Movements: Extraocular movements intact.     Conjunctiva/sclera: Conjunctivae normal.  Cardiovascular:     Rate and Rhythm: Normal rate and regular rhythm.     Heart sounds: Normal heart sounds, S1 normal and S2 normal.  Pulmonary:     Effort: Pulmonary effort is normal. No respiratory distress.     Breath sounds:  Normal breath sounds and air entry.  Musculoskeletal:     Cervical back: Neck supple.     Right hip: Normal.     Left hip: Normal.     Right upper leg: Normal.     Left upper leg: Normal.     Right knee: No swelling, deformity, effusion, erythema, ecchymosis, bony tenderness or crepitus. Normal range of motion. Tenderness present. Normal alignment. Normal pulse.     Left knee: Normal. No swelling or crepitus. Normal alignment.     Right lower leg: Edema (trace +1 pitting edema to ankles) present.     Left lower leg: Edema (trace +1 pitting edema to ankles) present.     Right ankle: Swelling present. No deformity, ecchymosis or lacerations. No tenderness. Normal range of motion. Anterior drawer test negative. Normal pulse.     Left ankle: Swelling present. No deformity, ecchymosis or lacerations. No tenderness. Normal range of motion. Anterior drawer test negative. Normal pulse.     Comments: No laxity elicited to exam of bilateral knees.  Slight tenderness to palpation to the generalized posterior right knee.  +2 popliteal pulses bilaterally.  No erythema, warmth, or swelling bilaterally. Trace edema to the bilateral ankles.  No evidence of erythema, warmth, or injury to the lateral ankles.  Venous stasis skin changes with dry flaking skin present to the bilateral ankles.  Less than 3 capillary refill.  +2 anterior tibialis pulses bilaterally.  Lymphadenopathy:     Cervical: No cervical adenopathy.  Skin:    General: Skin is warm and dry.     Capillary Refill: Capillary refill takes less than 2 seconds.     Findings: No rash.  Neurological:     General: No focal deficit present.     Mental Status: She is alert and oriented to person, place, and time. Mental status is at baseline.     Cranial Nerves: No dysarthria or facial asymmetry.     Motor: No weakness.     Gait: Gait normal.     Comments: Strength and sensation intact to bilateral upper and lower extremities (5/5). Moves all 4  extremities with normal coordination voluntarily. Non-focal neuro exam.   Psychiatric:        Mood and Affect: Mood normal.        Speech: Speech normal.        Behavior: Behavior normal.        Thought Content: Thought content normal.        Judgment: Judgment normal.      UC Treatments / Results  Labs (all labs ordered are listed, but only abnormal results are displayed) Labs Reviewed - No data to display  EKG   Radiology No  results found.  Procedures Procedures (including critical care time)  Medications Ordered in UC Medications - No data to display  Initial Impression / Assessment and Plan / UC Course  I have reviewed the triage vital signs and the nursing notes.  Pertinent labs & imaging results that were available during my care of the patient were reviewed by me and considered in my medical decision making (see chart for details).   1.  Venous stasis dermatitis of both lower extremities Advised to stop using triamcinolone as this is likely contributing to worsening skin thinning and flaking.  She is not experiencing any pruritus to the ankles.  No history of CHF, denies orthopnea and shortness of breath.  Advised to wear compression socks/stockings and elevate lower extremities as much as possible to reduce swelling.  Follow-up with PCP to discuss leg swelling further.  She does not appear to be volume overloaded and has hemodynamically stable vital signs.  2.  Muscle spasm of right knee, chronic pain of right knee Acute pain of right knee likely due to muscle fatigue from physical therapy.  Stable musculoskeletal exam findings, therefore deferred imaging.  Ambulatory with stable gait.  Neurovascularly intact to baseline without weakness.  Patient may continue use of Tylenol/ibuprofen as needed for aches and pains.  May use Zanaflex muscle relaxer every 8 hours as needed for muscle spasm.  Drowsiness precautions regarding Zanaflex muscle relaxer use discussed.  Heat 20  minutes on 20 minutes off recommended to relieve muscle spasm.  Advised to discuss symptoms further with physical therapist to adjust treatment plan.  Discussed physical exam and available lab work findings in clinic with patient.  Counseled patient regarding appropriate use of medications and potential side effects for all medications recommended or prescribed today. Discussed red flag signs and symptoms of worsening condition,when to call the PCP office, return to urgent care, and when to seek higher level of care in the emergency department. Patient verbalizes understanding and agreement with plan. All questions answered. Patient discharged in stable condition.    Final Clinical Impressions(s) / UC Diagnoses   Final diagnoses:  Venous stasis dermatitis of both lower extremities  Muscle spasm of right leg  Chronic pain of right knee     Discharge Instructions      Stop using triamcinolone cream to your legs.  This is likely contributing to the skin thinning that you are seeing and the cracking in your skin. I also believe you have some leg swelling and that is contributing to the skin changes that you are seeing to your ankles. Wear compression hose and elevate your legs daily to relieve swelling.  Please follow-up with your primary care provider regarding her leg swelling.  Your knee and right leg pain is likely due to muscle fatigue related to physical therapy. You may place heat to the right lower extremity 20 minutes on 20 minutes off to help relieve muscle spasm and tenderness. Continue taking Tylenol/ibuprofen as needed for aches and pains. Take Zanaflex muscle relaxer every 8 hours as needed for muscle spasm.  Do not take this and drive, drink alcohol, or go to work as it can make you very sleepy.  Mention this to your physical therapist as they may be able to change up your physical therapy plan to help you tolerate the muscle fatigue a little better while still improving your  symptoms and strength to your knee.  Return to urgent care as needed.      ED Prescriptions  Medication Sig Dispense Auth. Provider   tizanidine (ZANAFLEX) 2 MG capsule Take 1 capsule (2 mg total) by mouth 3 (three) times daily. 20 capsule Talbot Grumbling, FNP      PDMP not reviewed this encounter.   Talbot Grumbling, [place] 08/15/22 1148

## 2022-08-15 NOTE — Discharge Instructions (Signed)
Stop using triamcinolone cream to your legs.  This is likely contributing to the skin thinning that you are seeing and the cracking in your skin. I also believe you have some leg swelling and that is contributing to the skin changes that you are seeing to your ankles. Wear compression hose and elevate your legs daily to relieve swelling.  Please follow-up with your primary care provider regarding her leg swelling.  Your knee and right leg pain is likely due to muscle fatigue related to physical therapy. You may place heat to the right lower extremity 20 minutes on 20 minutes off to help relieve muscle spasm and tenderness. Continue taking Tylenol/ibuprofen as needed for aches and pains. Take Zanaflex muscle relaxer every 8 hours as needed for muscle spasm.  Do not take this and drive, drink alcohol, or go to work as it can make you very sleepy.  Mention this to your physical therapist as they may be able to change up your physical therapy plan to help you tolerate the muscle fatigue a little better while still improving your symptoms and strength to your knee.  Return to urgent care as needed.

## 2022-08-18 DIAGNOSIS — N3001 Acute cystitis with hematuria: Secondary | ICD-10-CM | POA: Diagnosis not present

## 2022-08-18 DIAGNOSIS — R300 Dysuria: Secondary | ICD-10-CM | POA: Diagnosis not present

## 2022-08-24 DIAGNOSIS — N390 Urinary tract infection, site not specified: Secondary | ICD-10-CM | POA: Diagnosis not present

## 2022-08-24 DIAGNOSIS — R5383 Other fatigue: Secondary | ICD-10-CM | POA: Diagnosis not present

## 2022-08-24 DIAGNOSIS — N898 Other specified noninflammatory disorders of vagina: Secondary | ICD-10-CM | POA: Diagnosis not present

## 2022-08-25 ENCOUNTER — Ambulatory Visit
Admission: RE | Admit: 2022-08-25 | Discharge: 2022-08-25 | Disposition: A | Discharge status: Home / Self Care | Payer: Medicare PPO | Source: Ambulatory Visit | Attending: Obstetrics and Gynecology | Admitting: Obstetrics and Gynecology

## 2022-08-25 ENCOUNTER — Ambulatory Visit: Payer: Medicare PPO

## 2022-08-25 DIAGNOSIS — N644 Mastodynia: Secondary | ICD-10-CM | POA: Diagnosis not present

## 2022-08-28 ENCOUNTER — Telehealth: Payer: Self-pay | Admitting: Family Medicine

## 2022-08-28 DIAGNOSIS — M25561 Pain in right knee: Secondary | ICD-10-CM | POA: Diagnosis not present

## 2022-08-28 DIAGNOSIS — M25551 Pain in right hip: Secondary | ICD-10-CM | POA: Diagnosis not present

## 2022-08-28 DIAGNOSIS — R262 Difficulty in walking, not elsewhere classified: Secondary | ICD-10-CM | POA: Diagnosis not present

## 2022-08-28 NOTE — Telephone Encounter (Signed)
Pt is wanting a prescription for Ibuprofen 600 mg. She knows you have not prescribed this for her before.  [facility] Address: [address] [place], Phone: [phone]  Pt at [phone].

## 2022-08-30 ENCOUNTER — Emergency Department (HOSPITAL_COMMUNITY)
Admission: EM | Admit: 2022-08-30 | Discharge: 2022-08-30 | Disposition: A | Discharge status: Home / Self Care | Payer: Medicare PPO | Attending: Emergency Medicine | Admitting: Emergency Medicine

## 2022-08-30 ENCOUNTER — Other Ambulatory Visit: Payer: Self-pay

## 2022-08-30 DIAGNOSIS — I10 Essential (primary) hypertension: Secondary | ICD-10-CM | POA: Diagnosis not present

## 2022-08-30 DIAGNOSIS — Z7982 Long term (current) use of aspirin: Secondary | ICD-10-CM | POA: Insufficient documentation

## 2022-08-30 DIAGNOSIS — Z7984 Long term (current) use of oral hypoglycemic drugs: Secondary | ICD-10-CM | POA: Diagnosis not present

## 2022-08-30 DIAGNOSIS — E119 Type 2 diabetes mellitus without complications: Secondary | ICD-10-CM | POA: Insufficient documentation

## 2022-08-30 DIAGNOSIS — Z853 Personal history of malignant neoplasm of breast: Secondary | ICD-10-CM | POA: Insufficient documentation

## 2022-08-30 DIAGNOSIS — M79641 Pain in right hand: Secondary | ICD-10-CM | POA: Insufficient documentation

## 2022-08-30 DIAGNOSIS — M25521 Pain in right elbow: Secondary | ICD-10-CM | POA: Insufficient documentation

## 2022-08-30 MED ORDER — GABAPENTIN 300 MG PO CAPS: 300.0000 mg | ORAL_CAPSULE | Freq: Every day | ORAL | 0 refills | Status: DC

## 2022-08-30 NOTE — ED Notes (Signed)
PT AFIB on cardiac monitor. Denies any hx or taking any medications for Afib.

## 2022-08-30 NOTE — ED Triage Notes (Signed)
Pt presents to ED via POV with reports of right arm weakness that began 3 days ago making it "hard to carry things". Pt reports hx of right knee pain which she's receiving PT for.

## 2022-08-30 NOTE — ED Provider Notes (Signed)
[place] EMERGENCY DEPARTMENT AT [facility] Provider Note   CSN: [id number] Arrival date & time: 08/30/22  4098     History  Chief Complaint  Patient presents with   Weakness    Right arm weakness    Bianca Medina is a 72 y.o. female history includes migraine, breast cancer, diabetes, GERD, hypertension, hyperlipidemia, iron deficiency anemia, obesity, sensorineural hearing loss bilaterally.  Patient presents to the ER for evaluation of right hand pain.  Patient reports that over the past 3 days she has noticed worsening pain shooting from her index, middle and ring finger up towards her elbow pain worsens whenever she is carrying things for a long time and she reports that she feels like she is unable to carry things due to the pain.  She reports pain improves with rest, described as shooting and throbbing, moderate intensity.  She reports that she noticed some pain around 1 month ago but has worsened over the past few days.  Patient reports pain will wake her up at night.  Patient denies fall, injury, fever, chills, swelling/color change, numbness/tingling, dizziness, vision changes, neck pain, chest pain or any additional concerns.  HPI     Home Medications Prior to Admission medications   Medication Sig Start Date End Date Taking? Authorizing Provider  gabapentin (NEURONTIN) 300 MG capsule Take 1 capsule (300 mg total) by mouth at bedtime for 20 doses. 08/30/22 09/19/22 Yes Bill Salinas, PA-C  aspirin EC 81 MG tablet Take 81 mg by mouth daily. Swallow whole.    [provider]  atorvastatin (LIPITOR) 10 MG tablet Take 1 tablet (10 mg total) by mouth daily. 04/23/22   [name], MD  Biotin 5000 MCG CAPS Take 5,000 mcg by mouth daily.     [provider]  chlorthalidone (HYGROTON) 25 MG tablet TAKE 1 TABLET (25 MG TOTAL) BY MOUTH DAILY. 02/24/22   [name], MD  fluticasone (FLONASE) 50 MCG/ACT nasal spray Place 2 sprays  into both nostrils daily. 05/13/21   [name], MD  losartan (COZAAR) 25 MG tablet Take 25 mg by mouth daily. 07/23/21   [provider]  meloxicam (MOBIC) 15 MG tablet Take 15 mg by mouth daily. 07/03/21   [provider]  metFORMIN (GLUCOPHAGE-XR) 500 MG 24 hr tablet TAKE 1 TABLET BY MOUTH EVERY DAY WITH BREAKFAST Patient taking differently: Take 500 mg by mouth daily with breakfast. 04/23/22   [name], MD  montelukast (SINGULAIR) 10 MG tablet Take 10 mg by mouth daily. 12/08/21   [provider]  Potassium Chloride ER 20 MEQ TBCR Take 1 tablet by mouth daily. 11/10/21   [provider]  PROAIR HFA 108 (90 Base) MCG/ACT inhaler Inhale 1-2 puffs into the lungs every 4 (four) hours as needed for wheezing or shortness of breath.  03/16/18   [provider]  SYMBICORT 80-4.5 MCG/ACT inhaler Inhale 2 puffs into the lungs 2 (two) times daily. 12/08/21   [provider]  tizanidine (ZANAFLEX) 2 MG capsule Take 1 capsule (2 mg total) by mouth 3 (three) times daily. 08/15/22   Carlisle Beers, FNP  triamcinolone cream (KENALOG) 0.1 % Apply topically 2 (two) times daily. 05/13/22   [provider]  Vitamin D, Ergocalciferol, (DRISDOL) 1.25 MG (50000 UNIT) CAPS capsule TAKE 1 CAPSULE BY MOUTH ONE TIME PER WEEK 08/06/22   [name], MD  vitamin E 1000 UNIT capsule Take 1,000 Units by mouth daily.  [provider]      Allergies    Codeine    Review of Systems   Review of Systems Ten systems are reviewed and are negative for acute change except as noted in the HPI  Physical Exam Updated Vital Signs BP (!) 143/63 (BP Location: Right Arm)   Pulse 72   Temp 97.8 F (36.6 C) (Oral)   Resp 19   SpO2 99%  Physical Exam Constitutional:      General: She is not in acute distress.    Appearance: Normal appearance. She is well-developed. She is not ill-appearing or diaphoretic.  HENT:      Head: Normocephalic and atraumatic.  Eyes:     General: Vision grossly intact. Gaze aligned appropriately.     Pupils: Pupils are equal, round, and reactive to light.  Neck:     Trachea: Trachea and phonation normal.  Pulmonary:     Effort: Pulmonary effort is normal. No respiratory distress.  Abdominal:     General: There is no distension.     Palpations: Abdomen is soft.     Tenderness: There is no abdominal tenderness. There is no guarding or rebound.  Musculoskeletal:        General: Normal range of motion.     Cervical back: Normal range of motion.     Comments: Right upper extremity: Atraumatic in appearance.  No swelling or overlying skin changes.  Appears symmetric when compared to the left side.  Full movement of the cervical spine without increased pain.  Full motion of the shoulder elbow wrist and hand.  She has some increased pain with all movement of the elbow.  She has good range of motion.  Patient is tender with palpation on the medial epicondyles and along the flexor muscle bellies of the forearm.  She is also tender at the cubital tunnel.  She is no bony tenderness of the wrist or hand on exam.  No tenderness of the paracervical muscles or of the cervical spine.  No tenderness of the shoulder.  She is 5/5 strength with all movements of the bilateral upper extremities without weakness.  This includes finger abduction, grip, wrist flexion/extension, elbow flexion/extension and shoulder abduction.  She has a negative Hoffmann sign bilaterally.  She has a strong and equal radial pulses.  Sensation intact in all distributions of the bilateral upper extremities.  Skin:    General: Skin is warm and dry.  Neurological:     Mental Status: She is alert.     GCS: GCS eye subscore is 4. GCS verbal subscore is 5. GCS motor subscore is 6.     Comments: Speech is clear and goal oriented, follows commands Major Cranial nerves without deficit, no facial droop Normal strength in upper and  lower extremities bilaterally including dorsiflexion and plantar flexion, strong and equal grip strength Sensation normal to light and sharp touch Moves extremities without ataxia, coordination intact Normal finger to nose and rapid alternating movements Neg romberg, no pronator drift Normal gait Normal heel-shin and balance  Psychiatric:        Behavior: Behavior normal.     ED Results / Procedures / Treatments   Labs (all labs ordered are listed, but only abnormal results are displayed) Labs Reviewed - No data to display  EKG EKG Interpretation  Date/Time:  Sunday August 30 2022 08:52:35 EDT Ventricular Rate:  58 PR Interval:  162 QRS Duration: 86 QT Interval:  440 QTC Calculation: 433 R Axis:   4 Text  Interpretation: Sinus arrhythmia Low voltage, extremity and precordial leads Confirmed by Alvester Chou [id number] on 08/30/2022 8:55:36 AM  Radiology No results found.  Procedures Procedures    Medications Ordered in ED Medications - No data to display  ED Course/ Medical Decision Making/ A&P Clinical Course as of 08/30/22 0930  Sun Aug 30, 2022  6045 Gabapentin, wrist brace, no steroids, neuro follow-up [BM]  0847 This is a very pleasant 72 year old female presenting to ED complaining of right finger and right distal arm pain.  Her right finger pain began about a month ago, spontaneously, and she has a discomfort and pain in her right second third and fourth fingers.  She denies any preceding trauma.  She said over the past month the pain sensation appears to have started traveling up towards her elbow.  She says there is throbbing in her forearm.  She denies fevers or chills.  She still has normal use of movement in her forearm.  She does report that she suffers from different arthralgias including in her knees for which she has been evaluated in the past.  On my exam the patient appears comfortable.  She does have some tenderness of range of motion near the elbow, without  palpable effusion of the elbow.  She has no paresthesias in any specific dermatomes noted on her forearm or her hand.  She has no neck pain or reproducible cervical radiculopathy.  She has normal motor nerve testing of the radial, median and ulnar nerves and the distal arm.  There is no weakness.  I have a very low suspicion for intracranial cause of the symptoms or stroke.  Likewise low suspicion for cervical nerve root impingement based on this presentation.  This may be a peripheral neuropathy, she does not want prednisone because it makes her too hungry and she is diabetic, we can try gabapentin instead.  I have a lower suspicion for an infection at this point, after a month of symptoms she does not have any evidence of edema, swelling, erythema, or nidus of infection to suggest that this is infectious etiology.  There is also the possibility of an inflammatory flexor tendinitis, although this would be an unusual distribution involving only the second and fourth finger. [MT]  [id number] I reviewed her telemetry as there was a nursing report of potentially irregular heartbeat.  She is in a sinus rhythm that has an irregular appearance on telemetry but is not A-fib.  Her EKG shows normal sinus rhythm [MT]    Clinical Course User Index [BM] Bill Salinas, PA-C [MT] Terald Sleeper, MD                             Medical Decision Making 72 year old female history as above presented for pain of the fingers of the right hand intermittent for 1 month worsening over the past 3 days rating up towards the right elbow and causing her some difficulty with carrying things.  She has no weakness on exam she has equal and intact sensation bilaterally and good range of motion and strength with all joints of the bilateral upper extremities.  No pain with motion of the cervical spine.  She is neurovascular intact.  There is no evidence for DVT on exam.  She is somewhat tender around the medial condyle and forearm flexor  muscle bellies.  She has a reassuring neurologic examination today.  No evidence to suggest CNS process such as CVA at this  time suspect patient experiencing peripheral neuropathy such as cubital tunnel syndrome or carpal tunnel syndrome however unclear at this time.  Have a low suspicion for acute CNS process, myelopathy, DVT, cellulitis, septic arthritis, compartment syndrome, fracture, neurovascular compromise or other emergent medical condition at this time.  Case discussed with attending physician Dr. Renaye Rakers who also saw and evaluated the patient today, please see his note.  Dr. Renaye Rakers discussed treatment options with patient today and the plan is to provide patient with a course of gabapentin 300 mg at bedtime to try and then to provide her with a wrist lacer brace to wear and she is to follow-up with neurology outpatient.  Strict ER precautions were given.  Risk Prescription drug management. Risk Details: Gabapentin precautions discussed with patient and she stated understanding.     At this time there does not appear to be any evidence of an acute emergency medical condition and the patient appears stable for discharge with appropriate outpatient follow up. Diagnosis was discussed with patient who verbalizes understanding of care plan and is agreeable to discharge. I have discussed return precautions with patient who verbalizes understanding. Patient encouraged to follow-up with their PCP and neurology. All questions answered.    Note: Portions of this report may have been transcribed using voice recognition software. Every effort was made to ensure accuracy; however, inadvertent computerized transcription errors may still be present.         Final Clinical Impression(s) / ED Diagnoses Final diagnoses:  Right elbow pain  Right hand pain    Rx / DC Orders ED Discharge Orders          Ordered    gabapentin (NEURONTIN) 300 MG capsule  Daily at bedtime        08/30/22 0930               Elizabeth Palau 08/30/22 0935    Terald Sleeper, MD 08/30/22 1816

## 2022-08-30 NOTE — Discharge Instructions (Addendum)
At this time there does not appear to be the presence of an emergent medical condition, however there is always the potential for conditions to change. Please read and follow the below instructions.  Please return to the Emergency Department immediately for any new or worsening symptoms. Please be sure to follow up with your Primary Care Provider within one week regarding your visit today; please call their office to schedule an appointment even if you are feeling better for a follow-up visit. You may use the nerve pain medication gabapentin as prescribed to help with your symptoms.  Do not drive or operate heavy machinery while taking gabapentin as it will make you drowsy.  Do not drink alcohol or take other sedating medications while taking gabapentin as this will worsen side effects. Please wear the wrist brace at all times to help with possible carpal tunnel syndrome.   Please read the additional information packets attached to your discharge summary.  Go to the nearest Emergency Department immediately if: You have fever or chills You have redness or swelling You have dizziness or vision changes You have chest pain or trouble breathing You have weakness of an arm or leg You have trouble speaking You have confusion You have any new concerning or worsening of symptoms  Do not take your medicine if  develop an itchy rash, swelling in your mouth or lips, or difficulty breathing; call 911 and seek immediate emergency medical attention if this occurs.  You may review your lab tests and imaging results in their entirety on your MyChart account.  Please discuss all results of fully with your primary care provider and other specialist at your follow-up visit.  Note: Portions of this text may have been transcribed using voice recognition software. Every effort was made to ensure accuracy; however, inadvertent computerized transcription errors may still be present.

## 2022-08-31 ENCOUNTER — Telehealth: Payer: Self-pay | Admitting: Family Medicine

## 2022-08-31 MED ORDER — IBUPROFEN 600 MG PO TABS: 600.0000 mg | ORAL_TABLET | Freq: Three times a day (TID) | ORAL | 0 refills | Status: DC | PRN

## 2022-08-31 NOTE — Telephone Encounter (Signed)
Caller Name: Nyeemah  Call back phone #: [phone]  Reason for Call: pt cancelled visit with Dr. Janee Morn 4/18 stating she went to ER over the weekend. She has appt with orthopedic 09/02/2022.

## 2022-08-31 NOTE — Telephone Encounter (Signed)
Noted. Dm/cma  

## 2022-08-31 NOTE — Addendum Note (Signed)
Addended by: Andrez Grime on: 08/31/2022 05:02 PM   Modules accepted: Orders

## 2022-09-02 ENCOUNTER — Ambulatory Visit (INDEPENDENT_AMBULATORY_CARE_PROVIDER_SITE_OTHER): Payer: Medicare PPO | Admitting: Orthopaedic Surgery

## 2022-09-02 ENCOUNTER — Telehealth: Payer: Self-pay

## 2022-09-02 ENCOUNTER — Encounter: Payer: Self-pay | Admitting: Family Medicine

## 2022-09-02 ENCOUNTER — Ambulatory Visit (INDEPENDENT_AMBULATORY_CARE_PROVIDER_SITE_OTHER): Payer: Medicare PPO

## 2022-09-02 ENCOUNTER — Ambulatory Visit (INDEPENDENT_AMBULATORY_CARE_PROVIDER_SITE_OTHER): Payer: Medicare PPO | Admitting: Family Medicine

## 2022-09-02 VITALS — BP 144/86 | HR 76 | Temp 97.6°F | Wt 273.4 lb

## 2022-09-02 VITALS — Wt 273.0 lb

## 2022-09-02 DIAGNOSIS — Z758 Other problems related to medical facilities and other health care: Secondary | ICD-10-CM | POA: Diagnosis not present

## 2022-09-02 DIAGNOSIS — G8929 Other chronic pain: Secondary | ICD-10-CM | POA: Diagnosis not present

## 2022-09-02 DIAGNOSIS — M79601 Pain in right arm: Secondary | ICD-10-CM | POA: Diagnosis not present

## 2022-09-02 DIAGNOSIS — R600 Localized edema: Secondary | ICD-10-CM | POA: Diagnosis not present

## 2022-09-02 DIAGNOSIS — E876 Hypokalemia: Secondary | ICD-10-CM

## 2022-09-02 DIAGNOSIS — I878 Other specified disorders of veins: Secondary | ICD-10-CM

## 2022-09-02 DIAGNOSIS — M1711 Unilateral primary osteoarthritis, right knee: Secondary | ICD-10-CM

## 2022-09-02 DIAGNOSIS — M25561 Pain in right knee: Secondary | ICD-10-CM | POA: Diagnosis not present

## 2022-09-02 MED ORDER — METHYLPREDNISOLONE ACETATE 40 MG/ML IJ SUSP
40.0000 mg | INTRAMUSCULAR | Status: AC | PRN
  Administered 2022-09-02: 40 mg via INTRA_ARTICULAR

## 2022-09-02 MED ORDER — CELECOXIB 200 MG PO CAPS: 200.0000 mg | ORAL_CAPSULE | Freq: Two times a day (BID) | ORAL | 3 refills | Status: DC | PRN

## 2022-09-02 MED ORDER — LIDOCAINE HCL 1 % IJ SOLN
3.0000 mL | INTRAMUSCULAR | Status: AC | PRN
  Administered 2022-09-02: 3 mL

## 2022-09-02 NOTE — Patient Instructions (Signed)
Please continue to wear compression stockings as well as elevating legs for lower extremity swelling.  We are getting blood work to assess potassium.    And blood work  and echocardiogram to rule out heart failure.

## 2022-09-02 NOTE — Progress Notes (Signed)
The patient is someone I am seeing for the first time.  She is a pleasant 72 year old female who comes in with right forearm and wrist and hand pain but also right knee pain.  She has seen one of my orthopedic colleagues in town years ago who has since retired.  She went to physical therapy and injections for her knee.  She is a diabetic.  Her last hemoglobin A1c in December was 7.5.  She does not have kidney disease.  She is morbidly obese with a BMI of 45.43.  She says the knee is certainly getting worse for her.  She went to the emergency room recently for her arm pain and they gave her gabapentin.  She denies any numbness and tingling in the right arm and denies any neck pain.  They did give her a wrist forearm splint which she has used some and she said that has been helpful.  She denies any acute injury.  She currently denies any chest pain, shortness of breath, fever, chills, nausea, vomiting.  I reviewed notes from her primary care physician within epic.  On examination of her right knee she does have varus deformity of that knee.  There is no effusion but there is significant patellofemoral crepitation and global pain in the knee.  Some of her motion is limited by her obesity.  Examination of her right upper extremity shows negative Phalen's and Tinel's exam.  There is no numbness and tingling.  She has pain over the tendons of the dorsum of the forearm.  2 views of the right knee show tricompartment arthritis.  I am recommending for her to a course of Celebrex to see if this would help with the inflammatory pain that she has with her knee and her arm.  Also recommend a steroid injection in her right knee and she agreed to this and tolerated well.  She absolutely understands that weight loss is going to be the most appropriate treatment for now as well as blood glucose control.  At some point she may end up qualifying for knee replacement surgery but right now we will conduct stay conservative and she  agrees with this as well.  She may be a good candidate for hyaluronic acid.  Will see her back in 4 weeks to see how she is doing overall.    Procedure Note  Patient: Bianca Medina             Date of Birth: 1950-10-08           MRN: [id number]             Visit Date: 09/02/2022  Procedures: Visit Diagnoses:  1. Chronic pain of right knee   2. Unilateral primary osteoarthritis, right knee   3. Pain in right arm     Large Joint Inj: R knee on 09/02/2022 9:28 AM Indications: diagnostic evaluation and pain Details: 22 G 1.5 in needle, superolateral approach  Arthrogram: No  Medications: 3 mL lidocaine 1 %; 40 mg methylPREDNISolone acetate 40 MG/ML Outcome: tolerated well, no immediate complications Procedure, treatment alternatives, risks and benefits explained, specific risks discussed. Consent was given by the patient. Immediately prior to procedure a time out was called to verify the correct patient, procedure, equipment, support staff and site/side marked as required. Patient was prepped and draped in the usual sterile fashion.

## 2022-09-02 NOTE — Telephone Encounter (Signed)
     Patient  visit on 4/14  at [place] [facility]   Have you been able to follow up with your primary care physician? Yes   The patient was or was not able to obtain any needed medicine or equipment. Yes   Are there diet recommendations that you are having difficulty following? Na   Patient expresses understanding of discharge instructions and education provided has no other needs at this time.  Yes      Ryian Lynde Pop [facility] Guide, [place] [phone] [address], [place], [place] [address] Phone: [phone] Email: [email or web address]    

## 2022-09-02 NOTE — Progress Notes (Signed)
Assessment/Plan:  Total time spent caring for the patient today was 50 minutes. This includes time spent before the visit reviewing the chart, time spent during the visit, and time spent after the visit on documentation, etc.  There may have been are other unrelated non-urgent complaints, but due to the busy schedule and the amount of time already spent with her, time does not permit to address these issues at today's visit.     Problem List Items Addressed This Visit       Cardiovascular and Mediastinum   Venous stasis of both lower extremities - Primary    Patient with multiple lower extremity ultrasounds that were negative for DVT.  Plan:  Order tests including TSH, BNP, Basic Metabolic Panel, and Magnesium to explore potential causes of leg swelling. Consideration of an Echocardiogram to rule out cardiac causes. Reinforce the use of compression stockings and leg elevation for symptom management. The decision to use diuretics such as Lasix will be based on the patient's potassium levels and overall kidney function.      Relevant Medications   furosemide (LASIX) 20 MG tablet   Other Relevant Orders   TSH (Completed)   Basic Metabolic Panel (BMET) (Completed)   Magnesium (Completed)   ECHOCARDIOGRAM COMPLETE   B Nat Peptide     Musculoskeletal and Integument   Arthritis of right knee    The steroid injection's effectiveness in relieving knee pain and improving mobility will be monitored. Focus on weight loss and diabetes management to alleviate stress on the joints. Patient to continue follow-up with orthopedics for treatment and evaluation regarding potential knee replacement.      Relevant Orders   TSH (Completed)   Basic Metabolic Panel (BMET) (Completed)   Magnesium (Completed)   ECHOCARDIOGRAM COMPLETE   B Nat Peptide     Other   Concerned about care plan    Obesity and diabetes management addressed by educating on importance of a structured diet and exercise  program and instructed to follow-up with primary care physician for ongoing concerns      Other Visit Diagnoses     Bilateral lower extremity edema       Relevant Medications   furosemide (LASIX) 20 MG tablet   Other Relevant Orders   TSH (Completed)   Basic Metabolic Panel (BMET) (Completed)   Magnesium (Completed)   ECHOCARDIOGRAM COMPLETE   B Nat Peptide   Hypokalemia       Relevant Orders   TSH (Completed)   Basic Metabolic Panel (BMET) (Completed)   Magnesium (Completed)   ECHOCARDIOGRAM COMPLETE   B Nat Peptide       Medications Discontinued During This Encounter  Medication Reason   furosemide (LASIX) 20 MG tablet Reorder    Return if symptoms worsen or fail to improve.    Subjective:   Encounter date: 09/02/2022  Bianca Medina is a 72 y.o. female who has Essential hypertension; Heartburn; Skin lesion; Menopause; Vaginal atrophy; Left breast mass; Breast tenderness in female; Morbid obesity; Sensorineural hearing loss (SNHL), bilateral; Tinnitus of both ears; History of anemia; Pre-diabetes; Viral syndrome; Common migraine; Ear itching; Healthcare maintenance; Vitamin D deficiency; Dehydration; History of syncope; Hospital discharge follow-up; Medicare annual wellness visit, subsequent; Snoring; Sleep related bruxism; Gasping for breath; Insomnia due to anxiety and fear; Class 3 severe obesity due to excess calories with serious comorbidity and body mass index (BMI) of 40.0 to 44.9 in adult; Iron deficiency; Need for influenza vaccination; Rash; Screening for colon cancer; Insect bite  of right wrist; Bite, insect; Other schizoaffective disorders; Thyroid nodule; Bruising; Left leg swelling; Hyperlipidemia; Slow transit constipation; Diverticulitis; Cellulitis; Leukocytosis; Invasive ductal carcinoma of breast; Type 2 diabetes mellitus with complication, without long-term current use of insulin; Right knee pain; Mood disorder; Facial droop; Pain of left upper extremity;  Vitamin E deficiency; Leg cramps; Venous stasis of both lower extremities; Arthritis of right knee; Follow-up medical care requested by patient; and Concerned about care plan on their problem list..   She  has a past medical history of Adenomatous colon polyp (2021), Anemia (2003), Asthma, Breast cancer, Chronic anal fissure, Common migraine (06/23/2019), Diabetes mellitus without complication, GERD (gastroesophageal reflux disease), Hearing loss, Hyperlipidemia, mild, Hypertension, IDA (iron deficiency anemia), NSVD (normal spontaneous vaginal delivery), Obesity, PMDD (premenstrual dysphoric disorder), Sensorineural hearing loss (SNHL) of both ears, and Sleep disorder..   CHIEF COMPLAINT: Patient presents with right elbow pain and difficulty walking due to leg and arm weakness following an emergency room visit.  HISTORY OF PRESENT ILLNESS:  Right Elbow Pain. The patient originally presented to the ER with right arm weakness and shooting pain, which has persisted. She was provided a brace and gabapentin for pain management, and is currently following up with orthopedics for this condition.  Patient reports gabapentin is somewhat helpful and is being taken as prescribed.  Right knee Pain. The patient also presents with chronic knee pain secondary to osteoarthritis with a recent acute exacerbation, inhibiting the ability to walk up steps. She reports sudden difficulty walking up steps.  She recently received a steroid injection from orthopedics f and states it helps some but still has ongoing pain.  Leg Swelling. Additionally, the patient complains of bilateral leg swelling, which has been an ongoing issue for over 1 year, but has been worsening over the past few weeks. She was previously prescribed Lasix for this condition but has lost the prescription and is seeking a new one.  Endocrine. The patient has concerns about diabetes management and associated weight, as advised by an orthopedist for  qualifying for potential knee replacement.  Review of Systems  Respiratory:  Negative for cough and shortness of breath.   Cardiovascular:  Positive for leg swelling. Negative for chest pain, palpitations, orthopnea, claudication and PND.  Musculoskeletal:  Positive for joint pain (Knee and elbow on right side).  All other systems reviewed and are negative.   Past Surgical History:  Procedure Laterality Date   ABDOMINAL HYSTERECTOMY     BLADDER REPAIR     BREAST BIOPSY Right    2023   COLONOSCOPY  11/2019   1 hpp and 1 tubl adenoma   COLONOSCOPY WITH PROPOFOL N/A 12/03/2021   Procedure: COLONOSCOPY WITH PROPOFOL;  Surgeon: Earline Mayotte, MD;  Location: [facility] ENDOSCOPY;  Service: Endoscopy;  Laterality: N/A;   GUM SURGERY  1980's   MANDIBLE FRACTURE SURGERY     TUBAL LIGATION      Outpatient Medications Prior to Visit  Medication Sig Dispense Refill   aspirin EC 81 MG tablet Take 81 mg by mouth daily. Swallow whole.     atorvastatin (LIPITOR) 10 MG tablet Take 1 tablet (10 mg total) by mouth daily. 90 tablet 1   Biotin 5000 MCG CAPS Take 5,000 mcg by mouth daily.      celecoxib (CELEBREX) 200 MG capsule Take 1 capsule (200 mg total) by mouth 2 (two) times daily between meals as needed. 60 capsule 3   chlorthalidone (HYGROTON) 25 MG tablet TAKE 1 TABLET (25 MG TOTAL) BY  MOUTH DAILY. 90 tablet 1   fluticasone (FLONASE) 50 MCG/ACT nasal spray Place 2 sprays into both nostrils daily. 16 g 6   gabapentin (NEURONTIN) 300 MG capsule Take 1 capsule (300 mg total) by mouth at bedtime for 20 doses. 20 capsule 0   ibuprofen (ADVIL) 600 MG tablet Take 1 tablet (600 mg total) by mouth every 8 (eight) hours as needed. 30 tablet 0   losartan (COZAAR) 25 MG tablet Take 25 mg by mouth daily.     metFORMIN (GLUCOPHAGE-XR) 500 MG 24 hr tablet TAKE 1 TABLET BY MOUTH EVERY DAY WITH BREAKFAST (Patient taking differently: Take 500 mg by mouth daily with breakfast.) 90 tablet 1   montelukast  (SINGULAIR) 10 MG tablet Take 10 mg by mouth daily.     PROAIR HFA 108 (90 Base) MCG/ACT inhaler Inhale 1-2 puffs into the lungs every 4 (four) hours as needed for wheezing or shortness of breath.      SYMBICORT 80-4.5 MCG/ACT inhaler Inhale 2 puffs into the lungs 2 (two) times daily.     tizanidine (ZANAFLEX) 2 MG capsule Take 1 capsule (2 mg total) by mouth 3 (three) times daily. 20 capsule 0   Potassium Chloride ER 20 MEQ TBCR Take 1 tablet by mouth daily. (Patient not taking: Reported on 09/02/2022)     triamcinolone cream (KENALOG) 0.1 % Apply topically 2 (two) times daily. (Patient not taking: Reported on 09/02/2022)     Vitamin D, Ergocalciferol, (DRISDOL) 1.25 MG (50000 UNIT) CAPS capsule TAKE 1 CAPSULE BY MOUTH ONE TIME PER WEEK (Patient not taking: Reported on 09/02/2022) 12 capsule 2   vitamin E 1000 UNIT capsule Take 1,000 Units by mouth daily. (Patient not taking: Reported on 09/02/2022)     No facility-administered medications prior to visit.    Family History  Problem Relation Age of Onset   Cancer Mother        LUNG- SMOKER   Diabetes Father    Hypertension Father    Heart disease Father    Stroke Father    Breast cancer Sister        lates 53s   Cancer Paternal Aunt        COLON   Cancer Paternal Uncle        COLON    Social History   Socioeconomic History   Marital status: Divorced    Spouse name: Not on file   Number of children: 3   Years of education: college   Highest education level: Not on file  Occupational History   Occupation: Interior and spatial designer: [employer]  Tobacco Use   Smoking status: Never   Smokeless tobacco: Never  Vaping Use   Vaping Use: Never used  Substance and Sexual Activity   Alcohol use: Not Currently   Drug use: Never   Sexual activity: Not Currently    Partners: Male    Birth control/protection: Surgical, Abstinence    Comment: HYSTERECTOMY-1st intercourse 17 yo-5 partners  Other  Topics Concern   Not on file  Social History Narrative   Lives alone   Right handed     Caffeine use: soda/tea couple times a week   Social Determinants of Health   Financial Resource Strain: Low Risk  (03/31/2022)   Overall Financial Resource Strain (CARDIA)    Difficulty of Paying Living Expenses: Not hard at all  Food Insecurity: No Food Insecurity (04/27/2022)   Hunger Vital Sign    Worried About Running Out  of Food in the Last Year: Never true    Ran Out of Food in the Last Year: Never true  Transportation Needs: No Transportation Needs (04/27/2022)   PRAPARE - Administrator, Civil Service (Medical): No    Lack of Transportation (Non-Medical): No  Physical Activity: Inactive (03/31/2022)   Exercise Vital Sign    Days of Exercise per Week: 0 days    Minutes of Exercise per Session: 0 min  Stress: No Stress Concern Present (03/31/2022)   [employer] of Occupational Health - Occupational Stress Questionnaire    Feeling of Stress : Not at all  Social Connections: Not on file  Intimate Partner Violence: Not At Risk (04/27/2022)   Humiliation, Afraid, Rape, and Kick questionnaire    Fear of Current or Ex-Partner: No    Emotionally Abused: No    Physically Abused: No    Sexually Abused: No                                                                                                  Objective:  Physical Exam: BP (!) 144/86 (BP Location: Left Arm, Patient Position: Sitting, Cuff Size: Large)   Pulse 76   Temp 97.6 F (36.4 C) (Temporal)   Wt 273 lb 6.4 oz (124 kg)   SpO2 97%   BMI 45.50 kg/m     Physical Exam Constitutional:      General: She is not in acute distress.    Appearance: Normal appearance. She is not ill-appearing or toxic-appearing.  HENT:     Head: Normocephalic and atraumatic.     Nose: Nose normal. No congestion.  Eyes:     General: No scleral icterus.    Extraocular Movements: Extraocular movements intact.  Cardiovascular:      Rate and Rhythm: Normal rate and regular rhythm.     Pulses: Normal pulses.     Heart sounds: Normal heart sounds.  Pulmonary:     Effort: Pulmonary effort is normal. No respiratory distress.     Breath sounds: Normal breath sounds.  Abdominal:     General: Abdomen is flat. Bowel sounds are normal.     Palpations: Abdomen is soft.  Musculoskeletal:        General: Normal range of motion.     Right lower leg: Edema present.     Left lower leg: Edema present.  Lymphadenopathy:     Cervical: No cervical adenopathy.  Skin:    General: Skin is warm and dry.     Findings: No rash.  Neurological:     General: No focal deficit present.     Mental Status: She is alert and oriented to person, place, and time. Mental status is at baseline.  Psychiatric:        Mood and Affect: Mood normal.        Behavior: Behavior normal.        Thought Content: Thought content normal.        Judgment: Judgment normal.     XR Knee 1-2 Views Right  Result Date: 09/02/2022 2 views of the right  knee show tricompartment arthritis.  There is osteophytes in all 3 compartments with mainly patellofemoral narrowing.  MM 3D DIAGNOSTIC MAMMOGRAM UNILATERAL LEFT BREAST  Result Date: 08/25/2022 CLINICAL DATA:  72 year old female with diffuse intermittent LEFT breast pain, recently improved. EXAM: DIGITAL DIAGNOSTIC UNILATERAL LEFT MAMMOGRAM WITH TOMOSYNTHESIS TECHNIQUE: Left digital diagnostic mammography and breast tomosynthesis was performed. COMPARISON:  Previous exam(s). ACR Breast Density Category a: The breasts are almost entirely fatty. FINDINGS: Full field views of the LEFT breast demonstrate no suspicious mass, distortion or worrisome calcifications. No changes are identified. IMPRESSION: 1. No evidence of LEFT breast malignancy or significant abnormality. RECOMMENDATION: Recommend clinical follow-up as indicated. Any further workup should be based on clinical grounds. Bilateral screening mammogram in 3  months to resume annual mammogram schedule. I have discussed the findings and recommendations with the patient. If applicable, a reminder letter will be sent to the patient regarding the next appointment. BI-RADS CATEGORY  1: Negative. Electronically Signed   By: Harmon Pier M.D.   On: 08/25/2022 09:30  [place] FNA BX THYROID 1ST LESION AFIRMA  Result Date: 08/05/2022 INDICATION: Indeterminate thyroid nodule EXAM: ULTRASOUND GUIDED FINE NEEDLE ASPIRATION OF INDETERMINATE THYROID NODULE COMPARISON:  June 23, 2022 MEDICATIONS: None COMPLICATIONS: None immediate. TECHNIQUE: Informed written consent was obtained from the patient after a discussion of the risks, benefits and alternatives to treatment. Questions regarding the procedure were encouraged and answered. A timeout was performed prior to the initiation of the procedure. Pre-procedural ultrasound scanning demonstrated unchanged size and appearance of the indeterminate nodule within the right thyroid lobe. The procedure was planned. The neck was prepped in the usual sterile fashion, and a sterile drape was applied covering the operative field. A timeout was performed prior to the initiation of the procedure. Local anesthesia was provided with 1% lidocaine. Under direct ultrasound guidance, x5 FNA biopsies were performed of the nodule in the right thyroid lobe with a 25 gauge needle. Multiple ultrasound images were saved for procedural documentation purposes. The samples were prepared and submitted to pathology. Limited post procedural scanning was negative for hematoma or additional complication. Dressings were placed. The patient tolerated the above procedures procedure well without immediate postprocedural complication. FINDINGS: Nodule reference number based on prior diagnostic ultrasound: 1 Maximum size: 3.0 cm Location: Right; Mid ACR TI-RADS risk category: TR3 (3 points) Reason for biopsy: meets ACR TI-RADS criteria Ultrasound imaging confirms appropriate  placement of the needles within the thyroid nodule. IMPRESSION: Technically successful ultrasound guided fine needle aspiration of nodule in the mid right thyroid lobe. Electronically Signed   By: Olive Bass M.D.   On: 08/05/2022 16:34   DG Hip Unilat With Pelvis 2-3 Views Right  Result Date: 08/03/2022 CLINICAL DATA:  Right upper thigh pain. EXAM: DG HIP (WITH OR WITHOUT PELVIS) 2-3V RIGHT COMPARISON:  CT abdomen and pelvis 04/26/2022 FINDINGS: Overlying large body habitus markedly limits evaluation of fine bony detail. There is again moderate to severe bilateral femoroacetabular joint space narrowing. The bilateral sacroiliac and pubic symphysis joint spaces appear maintained. No acute fracture is seen. No dislocation. IMPRESSION: Moderate to severe bilateral femoroacetabular degenerative joint space narrowing, similar to prior. Overlying large body habitus limits evaluation of fine bony detail and evaluation for an acute fracture. Electronically Signed   By: Neita Garnet M.D.   On: 08/03/2022 17:32   DG Hand Complete Right  Result Date: 08/03/2022 CLINICAL DATA:  Right hand and finger pain for the past week. No injury. EXAM: RIGHT HAND - COMPLETE 3+ VIEW COMPARISON:  None Available. FINDINGS: No acute fracture or dislocation. Mild third MCP joint space narrowing with small marginal osteophytes. Early marginal spurring of second MCP joint. Bone mineralization is normal. Soft tissues are unremarkable. IMPRESSION: 1. Mild third and early second MCP joint osteoarthritis. Electronically Signed   By: Obie Dredge M.D.   On: 08/03/2022 17:30   US THYROID  Result Date: 06/23/2022 CLINICAL DATA:  Dominant right mid thyroid TR 3 nodule EXAM: THYROID ULTRASOUND TECHNIQUE: Ultrasound examination of the thyroid gland and adjacent soft tissues was performed. COMPARISON:  05/01/2021 FINDINGS: Parenchymal Echotexture: Mildly heterogenous Isthmus: 8 mm Right lobe: 5.1 x 2.1 x 3.3 cm Left lobe: 4.9 x 1.9 x 1.5  cm _________________________________________________________ Estimated total number of nodules >/= 1 cm: 1 Number of spongiform nodules >/=  2 cm not described below (TR1): 0 Number of mixed cystic and solid nodules >/= 1.5 cm not described below (TR2): 0 _________________________________________________________ The right mid thyroid solid isoechoic TR 3 type nodule measures 3.0 x 2.1 x 2.7 cm, previously 2.8 x 2.5 x 1.9 cm. No significant interval change. Nodule does meet criteria for biopsy if not already performed. No additional thyroid abnormality. No new thyroid nodule. Normal vascularity. No regional adenopathy. IMPRESSION: 1. Stable 3.0 cm right mid thyroid TR 3 nodule. This nodule does meet criteria for biopsy as above. 2. No other significant finding by ultrasound. The above is in keeping with the ACR TI-RADS recommendations - J Am Coll Radiol 2017;14:587-595. Electronically Signed   By: Judie Petit.  Shick M.D.   On: 06/23/2022 10:53    Recent Results (from the past 2160 hour(s))  VITAMIN D 25 Hydroxy (Vit-D Deficiency, Fractures)     Status: None   Collection Time: 07/22/22  8:43 AM  Result Value Ref Range   VITD 31.60 30.00 - 100.00 ng/mL  Vitamin E     Status: None   Collection Time: 07/22/22  8:43 AM  Result Value Ref Range   Vitamin E (Alpha Tocopherol) 14.9 5.7 - 19.9 mg/L    Comment: Levels of alpha-tocopherol <5 mg/L are consistent with Vitamin E deficiency in adults.   Gamma-Tocopherol (Vit E) <1.0 <4.4 mg/L    Comment: (Note) . Vitamin supplementation within 24 hours prior to blood  draw may affect the accuracy of results. . This test was developed and its analytical performance  characteristics have been determined by [employer]. It has not been cleared or approved by the  FDA. This assay has been validated pursuant to the CLIA  regulations and is used for clinical purposes. . [facility] [address] [facility] [address] [place]  [id number] [phone] Doris Cheadle. Arthur Holms, MD   Basic metabolic panel     Status: Abnormal   Collection Time: 08/03/22  5:42 PM  Result Value Ref Range   Sodium 140 135 - 145 mmol/L   Potassium 4.4 3.5 - 5.1 mmol/L   Chloride 98 98 - 111 mmol/L   CO2 29 22 - 32 mmol/L   Glucose, Bld 108 (H) 70 - 99 mg/dL    Comment: Glucose reference range applies only to samples taken after fasting for at least 8 hours.   BUN 16 8 - 23 mg/dL   Creatinine, Ser 9.14 0.44 - 1.00 mg/dL   Calcium 9.1 8.9 - 78.2 mg/dL   GFR, Estimated >95 >62 mL/min    Comment: (NOTE) Calculated using the CKD-EPI Creatinine Equation (2021)    Anion gap 13 5 - 15    Comment: Performed at  [facility], [address] [place], [place] [address]  CBC     Status: Abnormal   Collection Time: 08/03/22  5:42 PM  Result Value Ref Range   WBC 12.0 (H) 4.0 - 10.5 K/uL   RBC 5.23 (H) 3.87 - 5.11 MIL/uL   Hemoglobin 12.9 12.0 - 15.0 g/dL   HCT 60.4 54.0 - 98.1 %   MCV 75.3 (L) 80.0 - 100.0 fL   MCH 24.7 (L) 26.0 - 34.0 pg   MCHC 32.7 30.0 - 36.0 g/dL   RDW 19.1 47.8 - 29.5 %   Platelets 287 150 - 400 K/uL   nRBC 0.0 0.0 - 0.2 %    Comment: Performed at [facility], [address], [place], [place] [address]  Cytology - Non PAP; RT LOBE THYROID     Status: None   Collection Time: 08/05/22  3:23 PM  Result Value Ref Range   CYTOLOGY - NON GYN      CYTOLOGY - NON PAP CASE: [id number] PATIENT: Assyria Saric Non-Gynecological Cytology Report     Clinical History: Mass of thyroid gland. 3.0 x 2.1 x 2.7 cm. Specimen Submitted:  A. THYROID, RT MID, FINE NEEDLE ASPIRATION:   FINAL MICROSCOPIC DIAGNOSIS: - Consistent with benign follicular nodule (Bethesda category II)  SPECIMEN ADEQUACY: Satisfactory for evaluation  GROSS: Received is/are 30 cc's of pale pink cytolyt solution and 6 slides in 95% ethyl alcohol. ([facility]:emh) Prepared: Smears:  6 Concentration Method (Thin Prep):  1 Cell Block:  Cell  block attempted, not obtained. Additional Studies: Afirma collected.     Final Diagnosis performed by Lisbeth Renshaw, MD.   Electronically signed 08/07/2022 Technical and / or Professional components performed at [facility]. [facility], [address] [place], [place] [address].  Immunohistochemistry Technical component (if applicable) was performed at [facility]. [address], [place], [place] [address].   IMMUNOHISTOCHEMISTRY DISCLAIMER (if applicable): Some of these immunohistochemical stains may have been developed and the performance characteristics determine by [facility]. Some may not have been cleared or approved by the U.S. Food and Drug Administration. The FDA has determined that such clearance or approval is not necessary. This test is used for clinical purposes. It should not be regarded as investigational or for research. This laboratory is certified under the Clinical Laboratory Improvement Amendments of 1988 (CLIA-88) as qualified to perform high complexity clinical laboratory testing.  The controls stained appropriately.   CBC with Differential     Status: Abnormal   Collection Time: 08/07/22  7:35 PM  Result Value Ref Range   WBC 10.9 (H) 4.0 - 10.5 K/uL   RBC 5.20 (H) 3.87 - 5.11 MIL/uL   Hemoglobin 12.7 12.0 - 15.0 g/dL   HCT 95.2 84.1 - 32.4 %   MCV 76.2 (L) 80.0 - 100.0 fL   MCH 24.4 (L) 26.0 - 34.0 pg   MCHC 32.1 30.0 - 36.0 g/dL   RDW 40.1 02.7 - 25.3 %   Platelets 151 150 - 400 K/uL    Comment: REPEATED TO VERIFY   nRBC 0.0 0.0 - 0.2 %   Neutrophils Relative % 68 %   Neutro Abs 7.5 1.7 - 7.7 K/uL   Lymphocytes Relative 22 %   Lymphs Abs 2.4 0.7 - 4.0 K/uL   Monocytes Relative 7 %   Monocytes Absolute 0.7 0.1 - 1.0 K/uL   Eosinophils Relative 2 %   Eosinophils Absolute 0.2 0.0 - 0.5 K/uL  Basophils Relative 1 %   Basophils Absolute 0.1 0.0 - 0.1 K/uL   Immature Granulocytes 0 %   Abs Immature  Granulocytes 0.03 0.00 - 0.07 K/uL    Comment: Performed at [facility], [address] [place], [place] [address]  Comprehensive metabolic panel     Status: Abnormal   Collection Time: 08/07/22  9:07 PM  Result Value Ref Range   Sodium 139 135 - 145 mmol/L   Potassium 3.4 (L) 3.5 - 5.1 mmol/L   Chloride 101 98 - 111 mmol/L   CO2 27 22 - 32 mmol/L   Glucose, Bld 107 (H) 70 - 99 mg/dL    Comment: Glucose reference range applies only to samples taken after fasting for at least 8 hours.   BUN 14 8 - 23 mg/dL   Creatinine, Ser 6.04 0.44 - 1.00 mg/dL   Calcium 8.8 (L) 8.9 - 10.3 mg/dL   Total Protein 6.4 (L) 6.5 - 8.1 g/dL   Albumin 3.0 (L) 3.5 - 5.0 g/dL   AST 15 15 - 41 U/L   ALT 16 0 - 44 U/L   Alkaline Phosphatase 87 38 - 126 U/L   Total Bilirubin 0.7 0.3 - 1.2 mg/dL   GFR, Estimated >54 >09 mL/min    Comment: (NOTE) Calculated using the CKD-EPI Creatinine Equation (2021)    Anion gap 11 5 - 15    Comment: Performed at [facility], [address], [place], [place] [address]  Magnesium     Status: None   Collection Time: 08/07/22  9:07 PM  Result Value Ref Range   Magnesium 1.8 1.7 - 2.4 mg/dL    Comment: Performed at [facility], [address] [place], [place] [address]  Phosphorus     Status: None   Collection Time: 08/07/22  9:07 PM  Result Value Ref Range   Phosphorus 2.9 2.5 - 4.6 mg/dL    Comment: Performed at [facility], [address], [place], [place] [address]  Vitamin B12     Status: None   Collection Time: 08/07/22  9:07 PM  Result Value Ref Range   Vitamin B-12 556 180 - 914 pg/mL    Comment: (NOTE) This assay is not validated for testing neonatal or myeloproliferative syndrome specimens for Vitamin B12 levels. Performed at [facility], [address], [place], [place] [address]   TSH     Status: Abnormal   Collection Time: 09/02/22  1:52 PM  Result Value Ref Range   TSH 0.34 (L) 0.35 - 5.50 uIU/mL  Basic Metabolic  Panel (BMET)     Status: Abnormal   Collection Time: 09/02/22  1:52 PM  Result Value Ref Range   Sodium 140 135 - 145 mEq/L   Potassium 4.3 3.5 - 5.1 mEq/L   Chloride 103 96 - 112 mEq/L   CO2 29 19 - 32 mEq/L   Glucose, Bld 174 (H) 70 - 99 mg/dL   BUN 17 6 - 23 mg/dL   Creatinine, Ser 8.46 0.40 - 1.20 mg/dL   GFR 96.29 >52.84 mL/min    Comment: Calculated using the CKD-EPI Creatinine Equation (2021)   Calcium 9.3 8.4 - 10.5 mg/dL  Magnesium     Status: None   Collection Time: 09/02/22  1:52 PM  Result Value Ref Range   Magnesium 1.7 1.5 - 2.5 mg/dL        Garner Nash, MD, MS

## 2022-09-03 ENCOUNTER — Ambulatory Visit: Payer: Medicare PPO | Admitting: Family Medicine

## 2022-09-03 ENCOUNTER — Telehealth: Payer: Self-pay | Admitting: Family Medicine

## 2022-09-03 LAB — BASIC METABOLIC PANEL
BUN: 17 mg/dL (ref 6–23)
CO2: 29 mEq/L (ref 19–32)
Calcium: 9.3 mg/dL (ref 8.4–10.5)
Chloride: 103 mEq/L (ref 96–112)
Creatinine, Ser: 0.77 mg/dL (ref 0.40–1.20)
GFR: 78.19 mL/min (ref >60.00)
Glucose, Bld: 174 mg/dL — ABNORMAL HIGH (ref 70–99)
Potassium: 4.3 mEq/L (ref 3.5–5.1)
Sodium: 140 mEq/L (ref 135–145)

## 2022-09-03 LAB — TSH: TSH: 0.34 u[IU]/mL — ABNORMAL LOW (ref 0.35–5.50)

## 2022-09-03 LAB — MAGNESIUM: Magnesium: 1.7 mg/dL (ref 1.5–2.5)

## 2022-09-03 NOTE — Telephone Encounter (Signed)
Caller Name: Bianca Medina Ph #: [phone] Chief Complaint: Pt stated "she feels like something is going off in my head and my throat is tight and I can feel my heartbeat in my throat.   This call was transferred to Nurse Triage/Access Nurse. This is for documentation purposes. No follow up required at this time.

## 2022-09-03 NOTE — Telephone Encounter (Signed)
Noted. Dm/cma  

## 2022-09-04 DIAGNOSIS — I878 Other specified disorders of veins: Secondary | ICD-10-CM | POA: Insufficient documentation

## 2022-09-04 DIAGNOSIS — Z5189 Encounter for other specified aftercare: Secondary | ICD-10-CM | POA: Insufficient documentation

## 2022-09-04 DIAGNOSIS — Z758 Other problems related to medical facilities and other health care: Secondary | ICD-10-CM | POA: Insufficient documentation

## 2022-09-04 DIAGNOSIS — M1711 Unilateral primary osteoarthritis, right knee: Secondary | ICD-10-CM | POA: Insufficient documentation

## 2022-09-04 MED ORDER — FUROSEMIDE 20 MG PO TABS: 20.0000 mg | ORAL_TABLET | Freq: Every day | ORAL | 0 refills | Status: DC | PRN

## 2022-09-04 MED ORDER — FUROSEMIDE 20 MG PO TABS: 20.0000 mg | ORAL_TABLET | Freq: Every day | ORAL | 3 refills | Status: DC | PRN

## 2022-09-04 NOTE — Assessment & Plan Note (Signed)
Obesity and diabetes management addressed by educating on importance of a structured diet and exercise program and instructed to follow-up with primary care physician for ongoing concerns

## 2022-09-04 NOTE — Assessment & Plan Note (Signed)
Patient with multiple lower extremity ultrasounds that were negative for DVT.  Plan:  Order tests including TSH, BNP, Basic Metabolic Panel, and Magnesium to explore potential causes of leg swelling. Consideration of an Echocardiogram to rule out cardiac causes. Reinforce the use of compression stockings and leg elevation for symptom management. The decision to use diuretics such as Lasix will be based on the patient's potassium levels and overall kidney function.

## 2022-09-04 NOTE — Assessment & Plan Note (Signed)
The steroid injection's effectiveness in relieving knee pain and improving mobility will be monitored. Focus on weight loss and diabetes management to alleviate stress on the joints. Patient to continue follow-up with orthopedics for treatment and evaluation regarding potential knee replacement.

## 2022-09-07 ENCOUNTER — Telehealth: Payer: Self-pay

## 2022-09-07 NOTE — Telephone Encounter (Signed)
Patient is aware of annotation below and lab results verbalized understanding.

## 2022-09-07 NOTE — Telephone Encounter (Signed)
-----   Message from [name], MD sent at 09/04/2022  2:35 PM EDT ----- Patient has normal potassium.  She may use furosemide as needed for lower leg swelling.  Patient to follow-up with Dr. Doreene Burke in 1 month to assess further.

## 2022-09-12 ENCOUNTER — Other Ambulatory Visit: Payer: Self-pay | Admitting: Family Medicine

## 2022-09-12 DIAGNOSIS — I10 Essential (primary) hypertension: Secondary | ICD-10-CM

## 2022-09-14 ENCOUNTER — Telehealth: Payer: Self-pay

## 2022-09-14 NOTE — Telephone Encounter (Signed)
Put on schedule and patient portal message sent

## 2022-09-14 NOTE — Telephone Encounter (Signed)
Pt called and lm on vm for triage that she was having arm pain and that she was not due to come in the office for eval until June but that she is having weakness, pain and decreased strength and was waiting to be seen sooner. I called to offer an appt with Bronson Curb on Thursday but no answer. Cb [phone]

## 2022-09-15 NOTE — Telephone Encounter (Signed)
Patient confirmed

## 2022-09-17 ENCOUNTER — Encounter: Payer: Self-pay | Admitting: Physician Assistant

## 2022-09-17 ENCOUNTER — Ambulatory Visit: Payer: Medicare PPO | Admitting: Physician Assistant

## 2022-09-17 DIAGNOSIS — M7701 Medial epicondylitis, right elbow: Secondary | ICD-10-CM

## 2022-09-17 NOTE — Progress Notes (Addendum)
HPI: Bianca Medina comes in today for right elbow forearm pain.  She describes no numbness tingling.  Has pain medial aspect of her right elbow states it is 3 out of 10.  She states that she tries to carry anything she has pain in the elbow region down the arm.  No known injury.  She does have pain if she tries to lie on the right arm.  She has had no real treatment.  This been ongoing for about a month.  She has taken some Tylenol.  Review of systems: See HPI otherwise negative or noncontributory.  Physical exam: General: Well-developed well-nourished female no acute distress.  Right elbow good range of motion without pain.  Tenderness over the medial epicondyle right elbow.  No rashes skin lesions.  Provocative maneuvers cause no significant pain at the elbow.  She is nontender over the distal biceps.  No tenderness over the lateral epicondyle right elbow.  Left elbow no tenderness over the medial or lateral epicondyles.  Radial pulses are 2+ equal and symmetric. Bilateral hands: Full range of motion in bilateral hands.  Sensation grossly intact throughout both hands to light touch.  Median nerve bilaterally compression at the wrist causes some numbness in the right thumb only.  Tinel's over the median nerve at the wrist bilaterally is negative Phalen's is negative bilaterally.  Impression: Right elbow medial epicondylitis  Plan: Lifting techniques were shown.  She is given a prescription for physical therapy for.  She will work on range of motion elbow include modalities home exercise program for medial epicondylitis.  Follow-up with [place] pain persist or becomes worse.  She is on Celebrex and she will continue this.  However she can begin using 2 g of Voltaren gel 4 times daily to the medial epicondyle region.  Questions were encouraged and answered

## 2022-09-21 ENCOUNTER — Other Ambulatory Visit: Payer: Self-pay

## 2022-09-21 ENCOUNTER — Telehealth: Payer: Self-pay | Admitting: Neurology

## 2022-09-21 DIAGNOSIS — I10 Essential (primary) hypertension: Secondary | ICD-10-CM

## 2022-09-21 DIAGNOSIS — E118 Type 2 diabetes mellitus with unspecified complications: Secondary | ICD-10-CM

## 2022-09-21 NOTE — Telephone Encounter (Signed)
Called the patient in regards to her scheduled apt for  5/7. There was no answer. LVM advising that we are having to reschedule d/t MD out. Asked the pt to call back for [place] to reschedule

## 2022-09-22 ENCOUNTER — Institutional Professional Consult (permissible substitution): Payer: Medicare PPO | Admitting: Neurology

## 2022-09-23 DIAGNOSIS — H9311 Tinnitus, right ear: Secondary | ICD-10-CM | POA: Diagnosis not present

## 2022-09-23 DIAGNOSIS — H903 Sensorineural hearing loss, bilateral: Secondary | ICD-10-CM | POA: Diagnosis not present

## 2022-09-23 DIAGNOSIS — J342 Deviated nasal septum: Secondary | ICD-10-CM | POA: Insufficient documentation

## 2022-09-24 ENCOUNTER — Ambulatory Visit: Payer: Medicare PPO | Admitting: Podiatry

## 2022-09-24 ENCOUNTER — Encounter: Payer: Self-pay | Admitting: Podiatry

## 2022-09-24 ENCOUNTER — Telehealth (INDEPENDENT_AMBULATORY_CARE_PROVIDER_SITE_OTHER): Payer: Medicare PPO | Admitting: Family Medicine

## 2022-09-24 ENCOUNTER — Encounter: Payer: Self-pay | Admitting: Family Medicine

## 2022-09-24 DIAGNOSIS — R600 Localized edema: Secondary | ICD-10-CM | POA: Diagnosis not present

## 2022-09-24 DIAGNOSIS — E519 Thiamine deficiency, unspecified: Secondary | ICD-10-CM

## 2022-09-24 DIAGNOSIS — F258 Other schizoaffective disorders: Secondary | ICD-10-CM | POA: Diagnosis not present
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
## 2022-09-24 NOTE — Progress Notes (Signed)
Subjective:   Patient ID: Bianca Medina, female   DOB: 72 y.o.   MRN: [id number]   HPI Patient presents with edema in the right foot and ankle and states she has been having knee problems and just had an injection   ROS      Objective:  Physical Exam  Neurovascular status intact negative Denna Haggard' sign was noted edema of the right midfoot into the ankle +1 pitting with no indications currently of clotting that I can detect     Assessment:  Probability with low mid grade swelling of the right foot secondary to structure and to probable venous disease     Plan:  H&P reviewed and recommended ankle compression stocking which was dispensed today and she could consider a fluid pill to try to reduce the edema but would want to get through her family doctor.  Patient will be seen back as symptoms indicate

## 2022-09-25 ENCOUNTER — Other Ambulatory Visit (INDEPENDENT_AMBULATORY_CARE_PROVIDER_SITE_OTHER): Payer: Medicare PPO

## 2022-09-25 DIAGNOSIS — M1711 Unilateral primary osteoarthritis, right knee: Secondary | ICD-10-CM

## 2022-09-25 DIAGNOSIS — R600 Localized edema: Secondary | ICD-10-CM | POA: Diagnosis not present

## 2022-09-25 DIAGNOSIS — E876 Hypokalemia: Secondary | ICD-10-CM

## 2022-09-25 DIAGNOSIS — E519 Thiamine deficiency, unspecified: Secondary | ICD-10-CM

## 2022-09-25 DIAGNOSIS — I878 Other specified disorders of veins: Secondary | ICD-10-CM

## 2022-09-25 LAB — BRAIN NATRIURETIC PEPTIDE: Pro B Natriuretic peptide (BNP): 42 pg/mL (ref 0.0–100.0)

## 2022-09-28 LAB — VITAMIN B1: Vitamin B1 (Thiamine): 9 nmol/L (ref 8–30)

## 2022-09-29 ENCOUNTER — Ambulatory Visit (HOSPITAL_COMMUNITY)
Admission: RE | Admit: 2022-09-29 | Discharge: 2022-09-29 | Disposition: A | Discharge status: Home / Self Care | Payer: Medicare PPO | Source: Ambulatory Visit | Attending: Family Medicine | Admitting: Family Medicine

## 2022-09-29 DIAGNOSIS — I3139 Other pericardial effusion (noninflammatory): Secondary | ICD-10-CM | POA: Diagnosis not present

## 2022-09-29 DIAGNOSIS — I878 Other specified disorders of veins: Secondary | ICD-10-CM | POA: Insufficient documentation

## 2022-09-29 DIAGNOSIS — R600 Localized edema: Secondary | ICD-10-CM | POA: Insufficient documentation

## 2022-09-29 DIAGNOSIS — E876 Hypokalemia: Secondary | ICD-10-CM | POA: Diagnosis not present

## 2022-09-29 DIAGNOSIS — R2240 Localized swelling, mass and lump, unspecified lower limb: Secondary | ICD-10-CM | POA: Insufficient documentation

## 2022-09-29 DIAGNOSIS — M1711 Unilateral primary osteoarthritis, right knee: Secondary | ICD-10-CM | POA: Insufficient documentation

## 2022-09-29 LAB — ECHOCARDIOGRAM COMPLETE
AR max vel: 1.36 cm2
AV Area VTI: 1.42 cm2
AV Area mean vel: 1.29 cm2
AV Mean grad: 9.8 mmHg
AV Peak grad: 16.9 mmHg
Ao pk vel: 2.06 m/s
Area-P 1/2: 3.37 cm2
Calc EF: 54.7 %
S' Lateral: 4 cm
Single Plane A2C EF: 55.1 %
Single Plane A4C EF: 54.1 %

## 2022-09-29 NOTE — Progress Notes (Signed)
Echocardiogram 2D Echocardiogram has been performed.  Augustine Radar 09/29/2022, 1:47 PM

## 2022-09-30 ENCOUNTER — Ambulatory Visit: Payer: Medicare PPO | Admitting: Orthopaedic Surgery

## 2022-09-30 ENCOUNTER — Telehealth: Payer: Self-pay | Admitting: Family Medicine

## 2022-09-30 ENCOUNTER — Encounter: Payer: Self-pay | Admitting: Orthopaedic Surgery

## 2022-09-30 DIAGNOSIS — M7701 Medial epicondylitis, right elbow: Secondary | ICD-10-CM | POA: Diagnosis not present

## 2022-09-30 DIAGNOSIS — M1711 Unilateral primary osteoarthritis, right knee: Secondary | ICD-10-CM

## 2022-09-30 MED ORDER — DICLOFENAC SODIUM 1 % EX GEL: 2.0000 g | Freq: Four times a day (QID) | CUTANEOUS | 1 refills | Status: AC

## 2022-09-30 NOTE — Telephone Encounter (Signed)
Caller Name: Suelynn, pt Call back phone #: [phone]  Reason for Call: pt states she saw Dr. Charlsie Merles at [facility] for feet/leg swelling and he advised her to f/u with PCP. Pt states she is seeking RX for Lasix to help reduce swelling.  [id number] [place], [place] - [phone] AT [facility] OF [facility] & [address] Phone: [phone]  Fax: [phone]

## 2022-09-30 NOTE — Progress Notes (Signed)
HPI: Bianca Medina returns today for follow-up of her right knee.  She states the cortisone injection on 09/02/2022 gave her good relief.  However she feels like it is wearing off.  She is really having no pain in the knee.  She notes that the pain in the back of the knee is totally gone.  She again voices that she feels as if she may have had a stroke on her right side and that she is having multiple problems on the right side including the knee and right elbow and arm pain.  She is due to see neurology.  She was seen earlier this month and diagnosed with medial epicondylitis right elbow and was given a prescription for physical therapy.  Unfortunately she lost this.  She did not obtain the Voltaren gel.  She is only taking Tylenol for her elbow and her knee and states that this does help.  She has had no new injuries.  Review of systems: See HPI otherwise negative  Physical exam: General well-developed well-nourished female in no acute distress Right elbow good range of motion.  Tenderness over the medial epicondyle consistent with medial epicondylitis. Bilateral knees hyperextension of both knees.  Good range of motion otherwise.  Tenderness medial joint line of both knees.  No abnormal warmth erythema or effusion of either knee.  No instability valgus or varus stressing  Impression: Right knee tricompartmental arthritis Right Medial epicondylitis  Plan: Will send in Voltaren gel to her pharmacy she can use 2 g up to 4 times daily on the elbow and 4 g up to 4 times daily on the knee.  She knows to wait at least 3 months between cortisone injections right knee.  She is given a prescription for physical therapy for both her elbow and her knee they will perform range of motion strengthening home exercise programs and include modalities.  She will follow-up with [place] as needed.  Questions were encouraged and answered by Dr. Magnus Ivan and myself.

## 2022-10-01 NOTE — Telephone Encounter (Signed)
Please advise message below will patient need to come in for OV?

## 2022-10-02 ENCOUNTER — Telehealth: Payer: Self-pay

## 2022-10-02 ENCOUNTER — Emergency Department (HOSPITAL_COMMUNITY)
Admission: EM | Admit: 2022-10-02 | Discharge: 2022-10-02 | Disposition: A | Discharge status: Home / Self Care | Payer: Medicare PPO | Attending: Emergency Medicine | Admitting: Emergency Medicine

## 2022-10-02 ENCOUNTER — Encounter (HOSPITAL_COMMUNITY): Payer: Self-pay

## 2022-10-02 ENCOUNTER — Other Ambulatory Visit: Payer: Self-pay

## 2022-10-02 DIAGNOSIS — R202 Paresthesia of skin: Secondary | ICD-10-CM | POA: Diagnosis not present

## 2022-10-02 DIAGNOSIS — R531 Weakness: Secondary | ICD-10-CM | POA: Insufficient documentation

## 2022-10-02 DIAGNOSIS — M7989 Other specified soft tissue disorders: Secondary | ICD-10-CM | POA: Diagnosis not present

## 2022-10-02 DIAGNOSIS — W19XXXA Unspecified fall, initial encounter: Secondary | ICD-10-CM

## 2022-10-02 DIAGNOSIS — Z5321 Procedure and treatment not carried out due to patient leaving prior to being seen by health care provider: Secondary | ICD-10-CM | POA: Insufficient documentation

## 2022-10-02 LAB — BASIC METABOLIC PANEL
Anion gap: 10 (ref 5–15)
BUN: 10 mg/dL (ref 8–23)
CO2: 26 mmol/L (ref 22–32)
Calcium: 8.5 mg/dL — ABNORMAL LOW (ref 8.9–10.3)
Chloride: 101 mmol/L (ref 98–111)
Creatinine, Ser: 0.74 mg/dL (ref 0.44–1.00)
GFR, Estimated: >60 mL/min (ref >60)
Glucose, Bld: 234 mg/dL — ABNORMAL HIGH (ref 70–99)
Potassium: 3.8 mmol/L (ref 3.5–5.1)
Sodium: 137 mmol/L (ref 135–145)

## 2022-10-02 LAB — CBC
HCT: 36.3 % (ref 36.0–46.0)
Hemoglobin: 11.8 g/dL — ABNORMAL LOW (ref 12.0–15.0)
MCH: 24.7 pg — ABNORMAL LOW (ref 26.0–34.0)
MCHC: 32.5 g/dL (ref 30.0–36.0)
MCV: 75.9 fL — ABNORMAL LOW (ref 80.0–100.0)
Platelets: 264 10*3/uL (ref 150–400)
RBC: 4.78 MIL/uL (ref 3.87–5.11)
RDW: 14.5 % (ref 11.5–15.5)
WBC: 10.1 10*3/uL (ref 4.0–10.5)
nRBC: 0 % (ref 0.0–0.2)

## 2022-10-02 LAB — CBG MONITORING, ED: Glucose-Capillary: 232 mg/dL — ABNORMAL HIGH (ref 70–99)

## 2022-10-02 NOTE — Telephone Encounter (Signed)
LMOM for patient to check with her PCP

## 2022-10-02 NOTE — ED Triage Notes (Signed)
Pt arrives POV with complaints of weakness and numbness in right arm and leg. Pt has swelling in bil legs but worsening on the right side. Pt states all of these sx have been happening for at least a month.

## 2022-10-02 NOTE — Telephone Encounter (Signed)
Patient would like to know if it is safe for her to use diclofenac due to having stroke like symptoms?  CB# [phone].  Please advise.  Thank you.

## 2022-10-02 NOTE — ED Provider Notes (Signed)
Patient eloped from the emergency department prior to my evaluation.

## 2022-10-02 NOTE — Telephone Encounter (Signed)
Appointment schedule  

## 2022-10-02 NOTE — ED Notes (Signed)
Patient called for room x3 w/ no answer. Dragging OTF.

## 2022-10-04 ENCOUNTER — Emergency Department (HOSPITAL_COMMUNITY): Payer: Medicare PPO

## 2022-10-04 ENCOUNTER — Emergency Department (HOSPITAL_COMMUNITY)
Admission: EM | Admit: 2022-10-04 | Discharge: 2022-10-04 | Disposition: A | Discharge status: Home / Self Care | Payer: Medicare PPO | Attending: Emergency Medicine | Admitting: Emergency Medicine

## 2022-10-04 DIAGNOSIS — R531 Weakness: Secondary | ICD-10-CM

## 2022-10-04 DIAGNOSIS — R21 Rash and other nonspecific skin eruption: Secondary | ICD-10-CM | POA: Diagnosis not present

## 2022-10-04 DIAGNOSIS — Z7982 Long term (current) use of aspirin: Secondary | ICD-10-CM | POA: Diagnosis not present

## 2022-10-04 DIAGNOSIS — R600 Localized edema: Secondary | ICD-10-CM | POA: Diagnosis not present

## 2022-10-04 DIAGNOSIS — R209 Unspecified disturbances of skin sensation: Secondary | ICD-10-CM | POA: Diagnosis not present

## 2022-10-04 DIAGNOSIS — M5416 Radiculopathy, lumbar region: Secondary | ICD-10-CM | POA: Diagnosis not present

## 2022-10-04 DIAGNOSIS — M25521 Pain in right elbow: Secondary | ICD-10-CM | POA: Diagnosis not present

## 2022-10-04 DIAGNOSIS — R29898 Other symptoms and signs involving the musculoskeletal system: Secondary | ICD-10-CM | POA: Diagnosis not present

## 2022-10-04 DIAGNOSIS — G459 Transient cerebral ischemic attack, unspecified: Secondary | ICD-10-CM | POA: Diagnosis not present

## 2022-10-04 DIAGNOSIS — R29818 Other symptoms and signs involving the nervous system: Secondary | ICD-10-CM | POA: Diagnosis not present

## 2022-10-04 DIAGNOSIS — M7701 Medial epicondylitis, right elbow: Secondary | ICD-10-CM

## 2022-10-04 DIAGNOSIS — M5126 Other intervertebral disc displacement, lumbar region: Secondary | ICD-10-CM | POA: Diagnosis not present

## 2022-10-04 DIAGNOSIS — Z7984 Long term (current) use of oral hypoglycemic drugs: Secondary | ICD-10-CM | POA: Insufficient documentation

## 2022-10-04 DIAGNOSIS — R2243 Localized swelling, mass and lump, lower limb, bilateral: Secondary | ICD-10-CM | POA: Insufficient documentation

## 2022-10-04 LAB — COMPREHENSIVE METABOLIC PANEL
ALT: 17 U/L (ref 0–44)
AST: 18 U/L (ref 15–41)
Albumin: 3.1 g/dL — ABNORMAL LOW (ref 3.5–5.0)
Alkaline Phosphatase: 80 U/L (ref 38–126)
Anion gap: 8 (ref 5–15)
BUN: 11 mg/dL (ref 8–23)
CO2: 27 mmol/L (ref 22–32)
Calcium: 8.7 mg/dL — ABNORMAL LOW (ref 8.9–10.3)
Chloride: 101 mmol/L (ref 98–111)
Creatinine, Ser: 0.75 mg/dL (ref 0.44–1.00)
GFR, Estimated: >60 mL/min (ref >60)
Glucose, Bld: 186 mg/dL — ABNORMAL HIGH (ref 70–99)
Potassium: 4.1 mmol/L (ref 3.5–5.1)
Sodium: 136 mmol/L (ref 135–145)
Total Bilirubin: 0.6 mg/dL (ref 0.3–1.2)
Total Protein: 6.7 g/dL (ref 6.5–8.1)

## 2022-10-04 LAB — CBC WITH DIFFERENTIAL/PLATELET
Abs Immature Granulocytes: 0.03 10*3/uL (ref 0.00–0.07)
Basophils Absolute: 0.1 10*3/uL (ref 0.0–0.1)
Basophils Relative: 1 %
Eosinophils Absolute: 0.2 10*3/uL (ref 0.0–0.5)
Eosinophils Relative: 2 %
HCT: 38.2 % (ref 36.0–46.0)
Hemoglobin: 12.3 g/dL (ref 12.0–15.0)
Immature Granulocytes: 0 %
Lymphocytes Relative: 17 %
Lymphs Abs: 1.8 10*3/uL (ref 0.7–4.0)
MCH: 24.5 pg — ABNORMAL LOW (ref 26.0–34.0)
MCHC: 32.2 g/dL (ref 30.0–36.0)
MCV: 75.9 fL — ABNORMAL LOW (ref 80.0–100.0)
Monocytes Absolute: 0.8 10*3/uL (ref 0.1–1.0)
Monocytes Relative: 8 %
Neutro Abs: 7.4 10*3/uL (ref 1.7–7.7)
Neutrophils Relative %: 72 %
Platelets: 273 10*3/uL (ref 150–400)
RBC: 5.03 MIL/uL (ref 3.87–5.11)
RDW: 14.5 % (ref 11.5–15.5)
WBC: 10.2 10*3/uL (ref 4.0–10.5)
nRBC: 0 % (ref 0.0–0.2)

## 2022-10-04 LAB — URINALYSIS, W/ REFLEX TO CULTURE (INFECTION SUSPECTED)
Bilirubin Urine: NEGATIVE
Glucose, UA: NEGATIVE mg/dL
Hgb urine dipstick: NEGATIVE
Ketones, ur: NEGATIVE mg/dL
Leukocytes,Ua: NEGATIVE
Nitrite: NEGATIVE
Protein, ur: NEGATIVE mg/dL
Specific Gravity, Urine: 1.015 (ref 1.005–1.030)
pH: 7 (ref 5.0–8.0)

## 2022-10-04 LAB — BRAIN NATRIURETIC PEPTIDE: B Natriuretic Peptide: 42.4 pg/mL (ref 0.0–100.0)

## 2022-10-04 MED ORDER — DIAZEPAM 5 MG/ML IJ SOLN
2.5000 mg | Freq: Once | INTRAMUSCULAR | Status: AC | PRN
  Administered 2022-10-04: 2.5 mg via INTRAVENOUS
  Filled 2022-10-04: qty 2

## 2022-10-04 NOTE — ED Notes (Addendum)
Pt refuses to go back on cardiac monitor and wants to leave

## 2022-10-04 NOTE — ED Notes (Signed)
Patient transported to MRI 

## 2022-10-04 NOTE — Discharge Instructions (Addendum)
Please follow-up with your PCP, and orthopedics for your herniated disc.  Return to the ER if you have any loss of bowel, bladder, weakness on one side of your body, or facial droop.  You can take Tylenol for your pain control.

## 2022-10-04 NOTE — ED Provider Notes (Signed)
[place] EMERGENCY DEPARTMENT AT [facility] Provider Note   CSN: [id number] Arrival date & time: 10/04/22  2952     History  Chief Complaint  Patient presents with   Extremity Weakness   Rash    Bianca Medina is a 72 y.o. female, hx of schizoaffective disorder, who presents to the ED multiple complaints.  She states she is mostly concerned because she may be having strokes.  She states that about 2 months ago she had some left-sided facial numbness, weakness, and then with weakness of her left lower extremity, she states that this persisted, and then about a month later, she developed right leg weakness, and now has some right arm pain for the last 2 to 3 weeks.  She states that she came here on Friday, but left prior to being seen because she had to wait too long.  She denies any swelling in bilateral legs, chest pain, shortness of breath.  No trauma to the areas.  She notes that she has to stand for an extended period of time, before starting to walk because her legs feel very weak, and it takes a while for her to get up.  She notes her right arm/elbow hurts, when she lifts things, or uses her arm repetitively.  She denies any pain at this time though.    Home Medications Prior to Admission medications   Medication Sig Start Date End Date Taking? Authorizing Provider  aspirin EC 81 MG tablet Take 81 mg by mouth daily. Swallow whole.    [provider]  atorvastatin (LIPITOR) 10 MG tablet Take 1 tablet (10 mg total) by mouth daily. 04/23/22   [name], MD  Biotin 5000 MCG CAPS Take 5,000 mcg by mouth daily.     [provider]  celecoxib (CELEBREX) 200 MG capsule Take 1 capsule (200 mg total) by mouth 2 (two) times daily between meals as needed. 09/02/22   Kathryne Hitch, MD  chlorthalidone (HYGROTON) 25 MG tablet TAKE 1 TABLET (25 MG TOTAL) BY MOUTH DAILY. 09/14/22   [name], MD  diclofenac Sodium (VOLTAREN) 1 % GEL  Apply 2 g topically 4 (four) times daily. 09/30/22   Kirtland Bouchard, PA-C  fluticasone (FLONASE) 50 MCG/ACT nasal spray Place 2 sprays into both nostrils daily. 05/13/21   [name], MD  furosemide (LASIX) 20 MG tablet Take 1 tablet (20 mg total) by mouth daily as needed for edema. 09/04/22 10/04/22  [name], MD  gabapentin (NEURONTIN) 300 MG capsule Take 1 capsule (300 mg total) by mouth at bedtime for 20 doses. 08/30/22 09/19/22  Harlene Salts A, PA-C  ibuprofen (ADVIL) 600 MG tablet Take 1 tablet (600 mg total) by mouth every 8 (eight) hours as needed. 08/31/22   [name], MD  losartan (COZAAR) 25 MG tablet Take 25 mg by mouth daily. 07/23/21   [provider]  metFORMIN (GLUCOPHAGE-XR) 500 MG 24 hr tablet TAKE 1 TABLET BY MOUTH EVERY DAY WITH BREAKFAST Patient taking differently: Take 500 mg by mouth daily with breakfast. 04/23/22   [name], MD  montelukast (SINGULAIR) 10 MG tablet Take 10 mg by mouth daily. 12/08/21   [provider]  Potassium Chloride ER 20 MEQ TBCR Take 1 tablet by mouth daily. 11/10/21   [provider]  PROAIR HFA 108 (90 Base) MCG/ACT inhaler Inhale 1-2 puffs into the lungs every 4 (four) hours as needed for wheezing or shortness of breath.  03/16/18   [provider]  SYMBICORT 80-4.5 MCG/ACT inhaler Inhale 2 puffs into the lungs 2 (two) times daily. 12/08/21   [provider]  tizanidine (ZANAFLEX) 2 MG capsule Take 1 capsule (2 mg total) by mouth 3 (three) times daily. 08/15/22   Carlisle Beers, FNP  triamcinolone cream (KENALOG) 0.1 % Apply topically 2 (two) times daily. 05/13/22   [provider]  Vitamin D, Ergocalciferol, (DRISDOL) 1.25 MG (50000 UNIT) CAPS capsule TAKE 1 CAPSULE BY MOUTH ONE TIME PER WEEK 08/06/22   [name], MD  vitamin E 1000 UNIT capsule Take 1,000 Units by mouth daily.    [provider]      Allergies    Codeine     Review of Systems   Review of Systems  Musculoskeletal:  Positive for extremity weakness. Negative for joint swelling.  Neurological:  Positive for numbness. Negative for dizziness.    Physical Exam Updated Vital Signs BP 128/68   Pulse 76   Temp 98.2 F (36.8 C) (Oral)   Resp 17   SpO2 99%  Physical Exam Vitals and nursing note reviewed.  Constitutional:      General: She is not in acute distress.    Appearance: She is well-developed.  HENT:     Head: Normocephalic and atraumatic.  Eyes:     Conjunctiva/sclera: Conjunctivae normal.  Cardiovascular:     Rate and Rhythm: Normal rate and regular rhythm.     Heart sounds: No murmur heard. Pulmonary:     Effort: Pulmonary effort is normal. No respiratory distress.     Breath sounds: Normal breath sounds.  Abdominal:     Palpations: Abdomen is soft.     Tenderness: There is no abdominal tenderness.  Musculoskeletal:     Cervical back: Neck supple.     Right lower leg: 1+ Pitting Edema present.     Left lower leg: 1+ Pitting Edema present.     Comments: 5/5 strength of BUE and BLE. No numbness or saddle anesthesia. No ttp of elbow or forearm. No stepoffs or erythema. No lumbar ttp.   Skin:    General: Skin is warm and dry.     Capillary Refill: Capillary refill takes less than 2 seconds.  Neurological:     Mental Status: She is alert.  Psychiatric:        Mood and Affect: Mood normal.     ED Results / Procedures / Treatments   Labs (all labs ordered are listed, but only abnormal results are displayed) Labs Reviewed  CBC WITH DIFFERENTIAL/PLATELET - Abnormal; Notable for the following components:      Result Value   MCV 75.9 (*)    MCH 24.5 (*)    All other components within normal limits  COMPREHENSIVE METABOLIC PANEL - Abnormal; Notable for the following components:   Glucose, Bld 186 (*)    Calcium 8.7 (*)    Albumin 3.1 (*)    All other components within normal limits  URINALYSIS, W/ REFLEX TO CULTURE  (INFECTION SUSPECTED) - Abnormal; Notable for the following components:   Bacteria, UA MANY (*)    All other components within normal limits  BRAIN NATRIURETIC PEPTIDE    EKG EKG Interpretation  Date/Time:  Sunday Oct 04 2022 09:41:08 EDT Ventricular Rate:  76 PR Interval:  161 QRS Duration: 90 QT Interval:  415 QTC Calculation: 467 R Axis:   -12 Text Interpretation: Sinus rhythm Atrial premature complex Consider anterior infarct No significant change since  last tracing Confirmed by Jacalyn Lefevre [phone]) on 10/04/2022 10:06:14 AM  Radiology MR LUMBAR SPINE WO CONTRAST  Result Date: 10/04/2022 CLINICAL DATA:  Lumbar radiculopathy. Symptoms persist greater than 6 weeks of treatment. Patient reports bilateral lower extremity weakness. EXAM: MRI LUMBAR SPINE WITHOUT CONTRAST TECHNIQUE: Multiplanar, multisequence MR imaging of the lumbar spine was performed. No intravenous contrast was administered. COMPARISON:  None Available. FINDINGS: Segmentation: 5 non rib-bearing lumbar type vertebral bodies are present. The lowest fully formed vertebral body is L5. Alignment: No significant listhesis is present. Lumbar lordosis is preserved. Mild leftward curvature is present at L3-4. Vertebrae:  Marrow signal and vertebral body heights are normal. Conus medullaris and cauda equina: Conus extends to the L1 level. Conus and cauda equina appear normal. Paraspinal and other soft tissues: Limited imaging the abdomen is unremarkable. There is no significant adenopathy. No solid organ lesions are present. Disc levels: L1-2: Normal disc signal and height is present. No focal protrusion or stenosis is present. L2-3: Normal disc signal and height is present. No focal protrusion or stenosis is present. L3-4: Normal disc signal and height is present. No focal protrusion or stenosis is present. L4-5: Mild disc bulging and facet hypertrophy is present. No significant stenosis is present. L5-S1: Asymmetric leftward disc  protrusion is present. Mild facet hypertrophy is noted bilaterally. Mild left foraminal stenosis is present. IMPRESSION: 1. Asymmetric leftward disc protrusion and facet hypertrophy at L5-S1 with mild left foraminal narrowing. 2. Mild disc bulging and facet hypertrophy at L4-5 without significant stenosis. Electronically Signed   By: Marin Roberts M.D.   On: 10/04/2022 14:20   MR Cervical Spine Wo Contrast  Result Date: 10/04/2022 CLINICAL DATA:  Myelopathy, acute, cervical spine. Bilateral leg weakness. Left upper extremity weakness. EXAM: MRI CERVICAL SPINE WITHOUT CONTRAST TECHNIQUE: Multiplanar, multisequence MR imaging of the cervical spine was performed. No intravenous contrast was administered. COMPARISON:  None Available. FINDINGS: Alignment: No significant listhesis is present. Straightening of the normal cervical lordosis is present. Vertebrae: Marrow signal and vertebral body heights are normal. Cord: Normal signal and morphology. Posterior Fossa, vertebral arteries, paraspinal tissues: Craniocervical junction is normal. Flow is present in the vertebral arteries bilaterally. Visualized intracranial contents are normal. Disc levels: C2-3: Negative. C3-4: Uncovertebral and facet hypertrophy lead to mild left foraminal narrowing. Central canal is patent. C4-5: Negative. C5-6: Mild right uncovertebral spurring and foraminal narrowing is present. C6-7: Negative. IMPRESSION: 1. Mild left foraminal narrowing at C3-4 due to uncovertebral and facet hypertrophy. 2. Mild right uncovertebral spurring and foraminal narrowing at C5-6. 3. Normal MRI appearance of the cervical spinal cord. Electronically Signed   By: Marin Roberts M.D.   On: 10/04/2022 14:09   MR BRAIN WO CONTRAST  Result Date: 10/04/2022 CLINICAL DATA:  TIA.  Leg weakness.  Right upper extremity weakness. EXAM: MRI HEAD WITHOUT CONTRAST TECHNIQUE: Multiplanar, multiecho pulse sequences of the brain and surrounding structures were  obtained without intravenous contrast. COMPARISON:  CT head without contrast 10/04/2022. MR head without contrast 07/14/2021 FINDINGS: Brain: No acute infarct, hemorrhage, or mass lesion is present. No significant white matter lesions are present. The ventricles are of normal size. No significant extraaxial fluid collection is present. The brainstem and cerebellum are within normal limits. The internal auditory canals are within normal limits. Midline structures are within normal limits. Vascular: Flow is present in the major intracranial arteries. Skull and upper cervical spine: Mild degenerative changes are present in the upper cervical spine. Craniocervical junction is normal. Marrow signal is normal. Sinuses/Orbits:  Chronic right maxillary sinus opacification is present. Paranasal sinuses are otherwise clear. Right greater than left mastoid effusions are present. No obstructing nasopharyngeal lesion is present. IMPRESSION: 1. Normal MRI appearance the brain. No acute or focal lesion to explain the patient's symptoms. 2. Chronic right maxillary sinus opacification. 3. Right greater than left mastoid effusions. No obstructing nasopharyngeal lesion is present. Electronically Signed   By: Marin Roberts M.D.   On: 10/04/2022 14:02   DG Elbow Complete Right  Result Date: 10/04/2022 CLINICAL DATA:  72 year old female with recurrent stroke-like symptoms, right elbow pain. EXAM: RIGHT ELBOW - COMPLETE 3+ VIEW COMPARISON:  None Available. FINDINGS: Incidental right antecubital fossa IV access. Bone mineralization is within normal limits. Normal for age joint spaces and alignment. No evidence of joint effusion. No osseous abnormality identified. IMPRESSION: Negative. Electronically Signed   By: Odessa Fleming M.D.   On: 10/04/2022 09:31   CT Head Wo Contrast  Result Date: 10/04/2022 CLINICAL DATA:  72 year old female with recurrent stroke-like symptoms. EXAM: CT HEAD WITHOUT CONTRAST TECHNIQUE: Contiguous axial  images were obtained from the base of the skull through the vertex without intravenous contrast. RADIATION DOSE REDUCTION: This exam was performed according to the departmental dose-optimization program which includes automated exposure control, adjustment of the mA and/or kV according to patient size and/or use of iterative reconstruction technique. COMPARISON:  Brain MRI 07/14/2021, head CT 10/14/2021. FINDINGS: Brain: Cerebral volume is within normal limits for age. No midline shift, ventriculomegaly, mass effect, evidence of mass lesion, intracranial hemorrhage or evidence of cortically based acute infarction. Gray-white matter differentiation appears stable from last year and within normal limits for age. Vascular: No suspicious intracranial vascular hyperdensity. Skull: Previous anterior maxilla ORIF. No acute osseous abnormality identified. Sinuses/Orbits: Previous maxilla ORIF. Chronic right maxillary sinus disease with mucoperiosteal thickening, progressed from last year. Other Visualized paranasal sinuses and mastoids are stable and well aerated. Other: No gaze deviation. No acute orbit or scalp soft tissue finding. IMPRESSION: 1. Stable and normal for age non contrast CT appearance of the brain. 2. Previous maxilla ORIF. Chronic but progressed right maxillary sinus disease. Electronically Signed   By: Odessa Fleming M.D.   On: 10/04/2022 09:26    Procedures Procedures    Medications Ordered in ED Medications  diazepam (VALIUM) injection 2.5 mg (2.5 mg Intravenous Given 10/04/22 1236)    ED Course/ Medical Decision Making/ A&P                             Medical Decision Making Patient is a 72 year old female, here for concern for possible CVA.  Symptoms have been going on for the last 2 months, initially was left-sided weakness, numbness, and now has developed right-sided weakness of the right leg, and right arm.  These complaints, were within multiple months, or weeks of each other.  We will  obtain an MRI of brain, cervical spine, and lumbar spine for further evaluation.  Additionally we will blood work for further evaluation as well as BNP secondary to bilateral lower extremity swelling.  Overall good strength, no neurodeficits.  Good pulses bilaterally  Amount and/or Complexity of Data Reviewed Labs: ordered.    Details: Unremarkable Radiology: ordered.    Details: MRI brain shows no acute findings, chronic findings denies any CVA, x-ray of elbow is unremarkable, MRI of the lumbar spine, shows disc protrusion, cervical MRI shows neuroforaminal narrowing Discussion of management or test interpretation with external provider(s): Discussed with patient,  her labs are reassuring.  MRI of lumbar spine shows disc protrusion which would cause some left-sided leg numbness.  She also has some neuroforaminal narrowing of the cervical region, which may cause some weakness in her bilateral arms.  On exam and per her report, she does have more pain in her right elbow when she moves it, or lift things.  She has been treated for medial epicondylitis, previously, and this is suspicious for this as well.  She has no red flag symptoms, and is continent of urine and bowel.  No fevers or chills.  Overall well-appearing is able to ambulate, I recommend that she follow-up with primary care doctor, and orthopedics for her herniated disc.  Discussed return precautions she voiced understanding.  Pt requesting to leave at time of discharge.  Risk Prescription drug management.    Final Clinical Impression(s) / ED Diagnoses Final diagnoses:  Weakness  Lumbar disc herniation  Medial epicondylitis of right elbow    Rx / DC Orders ED Discharge Orders     None         Morton Simson, [place], PA 10/04/22 1517    Jacalyn Lefevre, MD 10/13/22 1635

## 2022-10-04 NOTE — ED Notes (Signed)
Pt refused dc VS 

## 2022-10-04 NOTE — ED Notes (Signed)
Pt currently in MRI

## 2022-10-04 NOTE — ED Triage Notes (Addendum)
Pt. Stated, I was here on Friday for stroke like symptoms but I left because it was too long of a wait. Im having some leg weakness and my rt. Arm is weaker than my left arm.This started a month ago.  I also have a rash on both of my arms that started last Wednesday.  Pt. Had blood drawn on Friday for the same symptoms

## 2022-10-04 NOTE — ED Notes (Signed)
Pt ambulatory to bathroom

## 2022-10-04 NOTE — ED Notes (Signed)
Pt ambulated to restroom and returned to treatment room with no issues.

## 2022-10-06 ENCOUNTER — Telehealth: Payer: Self-pay

## 2022-10-06 NOTE — Transitions of Care (Post Inpatient/ED Visit) (Unsigned)
   10/06/2022  Name: Bianca Medina MRN: [id number] DOB: [birth year]  Today's TOC FU Call Status: Today's TOC FU Call Status:: Unsuccessul Call (1st Attempt) Unsuccessful Call (1st Attempt) Date: 10/06/22  Attempted to reach the patient regarding the most recent Inpatient/ED visit.  Follow Up Plan: Additional outreach attempts will be made to reach the patient to complete the Transitions of Care (Post Inpatient/ED visit) call.   Signature Arvil Persons, BSN, Charity fundraiser

## 2022-10-07 ENCOUNTER — Other Ambulatory Visit: Payer: Self-pay

## 2022-10-07 ENCOUNTER — Ambulatory Visit: Payer: Medicare PPO | Admitting: Family Medicine

## 2022-10-07 ENCOUNTER — Telehealth: Payer: Self-pay | Admitting: Family Medicine

## 2022-10-07 ENCOUNTER — Encounter (HOSPITAL_COMMUNITY): Payer: Self-pay

## 2022-10-07 ENCOUNTER — Emergency Department (HOSPITAL_COMMUNITY)
Admission: EM | Admit: 2022-10-07 | Discharge: 2022-10-07 | Disposition: A | Discharge status: Home / Self Care | Payer: Medicare PPO | Attending: Emergency Medicine | Admitting: Emergency Medicine

## 2022-10-07 DIAGNOSIS — F22 Delusional disorders: Secondary | ICD-10-CM | POA: Diagnosis not present

## 2022-10-07 DIAGNOSIS — Z7982 Long term (current) use of aspirin: Secondary | ICD-10-CM | POA: Insufficient documentation

## 2022-10-07 LAB — CBC WITH DIFFERENTIAL/PLATELET
Abs Immature Granulocytes: 0.02 10*3/uL (ref 0.00–0.07)
Basophils Absolute: 0.1 10*3/uL (ref 0.0–0.1)
Basophils Relative: 1 %
Eosinophils Absolute: 0.2 10*3/uL (ref 0.0–0.5)
Eosinophils Relative: 2 %
HCT: 37 % (ref 36.0–46.0)
Hemoglobin: 12.4 g/dL (ref 12.0–15.0)
Immature Granulocytes: 0 %
Lymphocytes Relative: 16 %
Lymphs Abs: 1.4 10*3/uL (ref 0.7–4.0)
MCH: 25.2 pg — ABNORMAL LOW (ref 26.0–34.0)
MCHC: 33.5 g/dL (ref 30.0–36.0)
MCV: 75.2 fL — ABNORMAL LOW (ref 80.0–100.0)
Monocytes Absolute: 0.8 10*3/uL (ref 0.1–1.0)
Monocytes Relative: 8 %
Neutro Abs: 6.7 10*3/uL (ref 1.7–7.7)
Neutrophils Relative %: 73 %
Platelets: 258 10*3/uL (ref 150–400)
RBC: 4.92 MIL/uL (ref 3.87–5.11)
RDW: 14.5 % (ref 11.5–15.5)
WBC: 9.2 10*3/uL (ref 4.0–10.5)
nRBC: 0 % (ref 0.0–0.2)

## 2022-10-07 LAB — COMPREHENSIVE METABOLIC PANEL
ALT: 19 U/L (ref 0–44)
AST: 17 U/L (ref 15–41)
Albumin: 3.1 g/dL — ABNORMAL LOW (ref 3.5–5.0)
Alkaline Phosphatase: 84 U/L (ref 38–126)
Anion gap: 9 (ref 5–15)
BUN: 14 mg/dL (ref 8–23)
CO2: 27 mmol/L (ref 22–32)
Calcium: 8.8 mg/dL — ABNORMAL LOW (ref 8.9–10.3)
Chloride: 101 mmol/L (ref 98–111)
Creatinine, Ser: 0.73 mg/dL (ref 0.44–1.00)
GFR, Estimated: >60 mL/min (ref >60)
Glucose, Bld: 155 mg/dL — ABNORMAL HIGH (ref 70–99)
Potassium: 3.6 mmol/L (ref 3.5–5.1)
Sodium: 137 mmol/L (ref 135–145)
Total Bilirubin: 1 mg/dL (ref 0.3–1.2)
Total Protein: 6.5 g/dL (ref 6.5–8.1)

## 2022-10-07 NOTE — ED Notes (Signed)
Got patient into a gown on the monitor patient is resting with call bell in reach got patient warm blanket °

## 2022-10-07 NOTE — ED Provider Notes (Signed)
[name] Provider Note   CSN: [id number] Arrival date & time: 10/07/22  0759     History  Chief Complaint  Patient presents with   States was Poisoned    Request Antidote    Bianca Medina is a 72 y.o. female.  HPI With multiple medical issues presents with concern for possible tampering with her medications. Patient acknowledges she has multiple medications, states that she takes them regularly, but feels as though her medications have been tampered with over the past 2 days.  She cannot provide the specific substance that she believes has been added to her medications or to which she has been exposed but it seems to be methylamine or something similar. She denies any actual physical complaints, any lightheadedness, nausea, states that she feels otherwise well, but is concerned about this possibility.     Home Medications Prior to Admission medications   Medication Sig Start Date End Date Taking? Authorizing Provider  aspirin EC 81 MG tablet Take 81 mg by mouth daily. Swallow whole.    [provider]  atorvastatin (LIPITOR) 10 MG tablet Take 1 tablet (10 mg total) by mouth daily. 04/23/22   [name], MD  Biotin 5000 MCG CAPS Take 5,000 mcg by mouth daily.     [provider]  celecoxib (CELEBREX) 200 MG capsule Take 1 capsule (200 mg total) by mouth 2 (two) times daily between meals as needed. 09/02/22   Kathryne Hitch, MD  chlorthalidone (HYGROTON) 25 MG tablet TAKE 1 TABLET (25 MG TOTAL) BY MOUTH DAILY. 09/14/22   [name], MD  diclofenac Sodium (VOLTAREN) 1 % GEL Apply 2 g topically 4 (four) times daily. 09/30/22   Kirtland Bouchard, PA-C  fluticasone (FLONASE) 50 MCG/ACT nasal spray Place 2 sprays into both nostrils daily. 05/13/21   [name], MD  furosemide (LASIX) 20 MG tablet Take 1 tablet (20 mg total) by mouth daily as needed for edema. 09/04/22 10/04/22   [name], MD  gabapentin (NEURONTIN) 300 MG capsule Take 1 capsule (300 mg total) by mouth at bedtime for 20 doses. 08/30/22 09/19/22  Harlene Salts A, PA-C  ibuprofen (ADVIL) 600 MG tablet Take 1 tablet (600 mg total) by mouth every 8 (eight) hours as needed. 08/31/22   [name], MD  losartan (COZAAR) 25 MG tablet Take 25 mg by mouth daily. 07/23/21   [provider]  metFORMIN (GLUCOPHAGE-XR) 500 MG 24 hr tablet TAKE 1 TABLET BY MOUTH EVERY DAY WITH BREAKFAST Patient taking differently: Take 500 mg by mouth daily with breakfast. 04/23/22   [name], MD  montelukast (SINGULAIR) 10 MG tablet Take 10 mg by mouth daily. 12/08/21   [provider]  Potassium Chloride ER 20 MEQ TBCR Take 1 tablet by mouth daily. 11/10/21   [provider]  PROAIR HFA 108 (90 Base) MCG/ACT inhaler Inhale 1-2 puffs into the lungs every 4 (four) hours as needed for wheezing or shortness of breath.  03/16/18   [provider]  SYMBICORT 80-4.5 MCG/ACT inhaler Inhale 2 puffs into the lungs 2 (two) times daily. 12/08/21   [provider]  tizanidine (ZANAFLEX) 2 MG capsule Take 1 capsule (2 mg total) by mouth 3 (three) times daily. 08/15/22   Carlisle Beers, FNP  triamcinolone cream (KENALOG) 0.1 % Apply topically 2 (two) times daily. 05/13/22   [provider]  Vitamin D, Ergocalciferol, (DRISDOL) 1.25 MG (50000 UNIT)  CAPS capsule TAKE 1 CAPSULE BY MOUTH ONE TIME PER WEEK 08/06/22   [name], MD  vitamin E 1000 UNIT capsule Take 1,000 Units by mouth daily.    [provider]      Allergies    Codeine    Review of Systems   Review of Systems  All other systems reviewed and are negative.   Physical Exam Updated Vital Signs BP (!) 145/85   Pulse (!) 54   Temp 98.4 F (36.9 C) (Oral)   Resp 15   Ht 5\' 5"  (1.651 m)   Wt 122.5 kg   SpO2 98%   BMI 44.94 kg/m  Physical Exam Vitals and nursing note  reviewed.  Constitutional:      General: She is not in acute distress.    Appearance: She is well-developed. She is obese.  HENT:     Head: Normocephalic and atraumatic.  Eyes:     Conjunctiva/sclera: Conjunctivae normal.  Cardiovascular:     Rate and Rhythm: Normal rate and regular rhythm.  Pulmonary:     Effort: Pulmonary effort is normal. No respiratory distress.     Breath sounds: Normal breath sounds. No stridor.  Abdominal:     General: There is no distension.  Skin:    General: Skin is warm and dry.  Neurological:     Mental Status: She is alert and oriented to person, place, and time.     Cranial Nerves: No cranial nerve deficit.  Psychiatric:        Mood and Affect: Mood normal.     Comments: Perseverating on possibility of medication alteration, and exposure.  Delusional.  Otherwise pleasantly interactive.     ED Results / Procedures / Treatments   Labs (all labs ordered are listed, but only abnormal results are displayed) Labs Reviewed  COMPREHENSIVE METABOLIC PANEL - Abnormal; Notable for the following components:      Result Value   Glucose, Bld 155 (*)    Calcium 8.8 (*)    Albumin 3.1 (*)    All other components within normal limits  CBC WITH DIFFERENTIAL/PLATELET - Abnormal; Notable for the following components:   MCV 75.2 (*)    MCH 25.2 (*)    All other components within normal limits    EKG None  Radiology No results found.  Procedures Procedures    Medications Ordered in ED Medications - No data to display  ED Course/ Medical Decision Making/ A&P                             Medical Decision Making Adult female with multiple medical problems including acute affective disorder presents with perseverative thoughts about possible medication tampering or exposure to foreign substance.  Patient's denial of physical complaints, stable vital signs, absence of distress or arrhythmia on hours of monitoring here is reassuring.  I have reviewed her  labs, no evidence for abnormalities, anything consistent with what ever may have been possibly adulterated.  Without alarming findings, without overt distress, or actual physical complaints, patient is appropriate for following up with her physician, which we discussed the importance of prior to discharge.  Amount and/or Complexity of Data Reviewed External Data Reviewed: notes.    Details: Lab and prior ED visits in the past 6 months Labs: ordered. Decision-making details documented in ED Course.  Risk Prescription drug management. Decision regarding hospitalization.  Final Clinical Impression(s) / ED Diagnoses Final diagnoses:  Paranoia (HCC)  Rx / DC Orders ED Discharge Orders     None         Gerhard Munch, MD 10/07/22 1153

## 2022-10-07 NOTE — Telephone Encounter (Signed)
Pt NS on 5/22 She called to say she had been discharged but had to go back to ED this am. Letter printed

## 2022-10-07 NOTE — ED Notes (Signed)
Pt asking for an ice pack for her L ankle cramping. Gave pt an ice pack.

## 2022-10-07 NOTE — Transitions of Care (Post Inpatient/ED Visit) (Signed)
10/07/2022  Name: Bianca Medina MRN: [id number] DOB: 05/11/1951  Today's TOC FU Call Status: Today's TOC FU Call Status:: Successful TOC FU Call Competed Unsuccessful Call (1st Attempt) Date: 10/06/22 Unsuccessful Call (2nd Attempt) Date: 10/07/22 [facility] FU Call Complete Date: 10/07/22  Transition Care Management Follow-up Telephone Call Date of Discharge: 10/04/22 Discharge Facility: [facility]) Type of Discharge: Emergency Department Reason for ED Visit:  (weakness) How have you been since you were released from the hospital?: Worse  Items Reviewed: Did you receive and understand the discharge instructions provided?: Yes Medications obtained,verified, and reconciled?: No Any new allergies since your discharge?: No Dietary orders reviewed?: NA Do you have support at home?: Yes People in Home: alone  Medications Reviewed Today: Medications Reviewed Today     Reviewed by Delrae Alfred, RN (Registered Nurse) on 10/07/22 at 0827  Med List Status: <None>   Medication Order Taking? Sig Documenting Provider Last Dose Status Informant  aspirin EC 81 MG tablet [id number]  Take 81 mg by mouth daily. Swallow whole. [provider]  Active Self, Pharmacy Records  atorvastatin (LIPITOR) 10 MG tablet [id number]  Take 1 tablet (10 mg total) by mouth daily. [name], MD  Active Self, Pharmacy Records  Biotin 5000 MCG CAPS [id number]  Take 5,000 mcg by mouth daily.  [provider]  Active Self, Pharmacy Records  celecoxib (CELEBREX) 200 MG capsule [id number]  Take 1 capsule (200 mg total) by mouth 2 (two) times daily between meals as needed. Kathryne Hitch, MD  Active   chlorthalidone (HYGROTON) 25 MG tablet [id number]  TAKE 1 TABLET (25 MG TOTAL) BY MOUTH DAILY. [name], MD  Active   diclofenac Sodium (VOLTAREN) 1 % GEL [id number]  Apply 2 g topically 4 (four) times daily. Kirtland Bouchard, PA-C  Active   fluticasone (FLONASE) 50 MCG/ACT  nasal spray [id number]  Place 2 sprays into both nostrils daily. [name], MD  Active Self, Pharmacy Records  furosemide (LASIX) 20 MG tablet [id number]  Take 1 tablet (20 mg total) by mouth daily as needed for edema. [name], MD  Expired 10/04/22 2359   gabapentin (NEURONTIN) 300 MG capsule [id number]  Take 1 capsule (300 mg total) by mouth at bedtime for 20 doses. Harlene Salts A, PA-C  Expired 09/19/22 2359   ibuprofen (ADVIL) 600 MG tablet [id number]  Take 1 tablet (600 mg total) by mouth every 8 (eight) hours as needed. [name], MD  Active   losartan (COZAAR) 25 MG tablet [id number]  Take 25 mg by mouth daily. [provider]  Active Self, Pharmacy Records  metFORMIN (GLUCOPHAGE-XR) 500 MG 24 hr tablet [id number]  TAKE 1 TABLET BY MOUTH EVERY DAY WITH BREAKFAST  Patient taking differently: Take 500 mg by mouth daily with breakfast.   [name], MD  Active Self, Pharmacy Records  montelukast (SINGULAIR) 10 MG tablet [id number]  Take 10 mg by mouth daily. [provider]  Active Self, Pharmacy Records  Potassium Chloride ER 20 MEQ TBCR [id number]  Take 1 tablet by mouth daily. [provider]  Active Self, Pharmacy Records  PROAIR HFA 108 [phone] Base) MCG/ACT inhaler [id number]  Inhale 1-2 puffs into the lungs every 4 (four) hours as needed for wheezing or shortness of breath.  [provider]  Active Self, Pharmacy Records  SYMBICORT 80-4.5 MCG/ACT inhaler [id number]  Inhale 2 puffs into the lungs 2 (two) times daily. [provider]  Active Self, Pharmacy  Records  tizanidine (ZANAFLEX) 2 MG capsule [id number]  Take 1 capsule (2 mg total) by mouth 3 (three) times daily. Carlisle Beers, FNP  Active   triamcinolone cream (KENALOG) 0.1 % [id number]  Apply topically 2 (two) times daily. [provider]  Active   Vitamin D, Ergocalciferol, (DRISDOL) 1.25 MG (50000 UNIT) CAPS capsule [id number]  TAKE 1  CAPSULE BY MOUTH ONE TIME PER WEEK [name], MD  Active   vitamin E 1000 UNIT capsule [id number]  Take 1,000 Units by mouth daily. [provider]  Active Self, Pharmacy Records            Home Care and Equipment/Supplies: Were Home Health Services Ordered?: NA Any new equipment or medical supplies ordered?: NA  Functional Questionnaire: Do you need assistance with bathing/showering or dressing?: No Do you need assistance with meal preparation?: No Do you need assistance with eating?: No Do you have difficulty maintaining continence: No Do you need assistance with getting out of bed/getting out of a chair/moving?: No Do you have difficulty managing or taking your medications?: No  Follow up appointments reviewed: PCP Follow-up appointment confirmed?: No Specialist Hospital Follow-up appointment confirmed?: No Do you need transportation to your follow-up appointment?: No Do you understand care options if your condition(s) worsen?: Yes-patient verbalized understanding    SIGNATURE Arvil Persons, BSN, RN

## 2022-10-07 NOTE — Transitions of Care (Post Inpatient/ED Visit) (Signed)
   10/07/2022  Name: Bianca Medina MRN: [id number] DOB: 10/12/1950  Today's TOC FU Call Status: Today's TOC FU Call Status:: Unsuccessful Call (2nd Attempt) Unsuccessful Call (1st Attempt) Date: 10/06/22 Unsuccessful Call (2nd Attempt) Date: 10/07/22  Attempted to reach the patient regarding the most recent Inpatient/ED visit.  Follow Up Plan: Additional outreach attempts will be made to reach the patient to complete the Transitions of Care (Post Inpatient/ED visit) call.   Signature Arvil Persons, BSN, Charity fundraiser

## 2022-10-07 NOTE — ED Triage Notes (Signed)
Pt came in via POV d/t stating she had a break & entering in to her home & has been poisoned with "Methylamine" being put in her food & meds. Pt states she called her PCP & they told her that an antidote for that was brought here to the ED & she is here to get it. A/Ox4, denies current pain while in triage, endorses Rt shoulder & Rt foot pain when she took her meds this morning.

## 2022-10-07 NOTE — Discharge Instructions (Addendum)
With your thoughts about possible exposure to an illicit substance, and paranoia about this, it is importantly follow-up with your physician.  Please discuss today's ED evaluation and your medication regimen in general.  Return here for concerning changes in your condition.

## 2022-10-09 ENCOUNTER — Other Ambulatory Visit: Payer: Self-pay

## 2022-10-09 ENCOUNTER — Telehealth: Payer: Self-pay

## 2022-10-09 ENCOUNTER — Ambulatory Visit (HOSPITAL_COMMUNITY)
Admission: EM | Admit: 2022-10-09 | Discharge: 2022-10-09 | Disposition: A | Discharge status: Home / Self Care | Payer: Medicare PPO

## 2022-10-09 ENCOUNTER — Encounter (HOSPITAL_COMMUNITY): Payer: Self-pay | Admitting: Emergency Medicine

## 2022-10-09 DIAGNOSIS — R262 Difficulty in walking, not elsewhere classified: Secondary | ICD-10-CM | POA: Diagnosis not present

## 2022-10-09 DIAGNOSIS — E782 Mixed hyperlipidemia: Secondary | ICD-10-CM | POA: Diagnosis not present

## 2022-10-09 DIAGNOSIS — E049 Nontoxic goiter, unspecified: Secondary | ICD-10-CM | POA: Diagnosis not present

## 2022-10-09 DIAGNOSIS — I878 Other specified disorders of veins: Secondary | ICD-10-CM | POA: Diagnosis not present

## 2022-10-09 DIAGNOSIS — M79601 Pain in right arm: Secondary | ICD-10-CM | POA: Diagnosis not present

## 2022-10-09 DIAGNOSIS — R531 Weakness: Secondary | ICD-10-CM | POA: Diagnosis not present

## 2022-10-09 DIAGNOSIS — R600 Localized edema: Secondary | ICD-10-CM | POA: Diagnosis not present

## 2022-10-09 DIAGNOSIS — I10 Essential (primary) hypertension: Secondary | ICD-10-CM | POA: Diagnosis not present

## 2022-10-09 DIAGNOSIS — E119 Type 2 diabetes mellitus without complications: Secondary | ICD-10-CM | POA: Diagnosis not present

## 2022-10-09 DIAGNOSIS — M25561 Pain in right knee: Secondary | ICD-10-CM | POA: Diagnosis not present

## 2022-10-09 NOTE — Telephone Encounter (Signed)
Did not mark as no show, fee waived, pt was in ER & is in ER again today 10/09/22

## 2022-10-09 NOTE — ED Triage Notes (Signed)
Pt here for BLE edema.  Has been seen multiple times in past for this and reports they discussed lasix but reports the ER doctor said this would not be good idea because on metformin.  Started PT today for RLE/RUE pain.  Has been wearing compression stockings.  Has had some tingling/burning in feet with the swelling.  VSS.  Ambulatory.  Has appointment with cardiology at 1 pm today.

## 2022-10-09 NOTE — Discharge Instructions (Signed)
Please continue with the compression stockings, rest and elevate your extremities to help with the swelling.  It is important that you keep your follow-up with cardiology.  The gabapentin could have potentially exacerbated your peripheral edema.   Please seek immediate care if you develop chest pain, shortness of breath, syncope, or any new concerning symptoms.

## 2022-10-09 NOTE — Progress Notes (Signed)
Care Management & Coordination Services Pharmacy Team  Reason for Encounter: Appointment Reminder  Contacted patient to confirm in office appointment with Julious Payer, PharmD on 03/17/2023 at 10:00 am.  {US HC Outreach:28874}  Do you have any problems getting your medications? {yes/no:20286} If yes what types of problems are you experiencing? {Problems:27223}  What is your top health concern you would like to discuss at your upcoming visit?   Have you seen any other providers since your last visit with PCP? {yes/no:20286}  Star Rating Drugs:  Atorvastatin 10 mg Last Fill: 03/13/2022 [facility]. Losartan 25 mg Last Fill: 07/14/2022 [facility]. Metformin 500 mg Last Fill: 07/01/2022 90 Day Supply at [facility].  Care Gaps: Annual wellness visit in last year? Yes Last completed 03/31/2022 Mammogram  If Diabetic: Last eye exam / retinopathy screening: Last completed 07/03/2021 Last diabetic foot exam:Last completed 07/22/2022 Chart review:  Recent office visits:  09/24/2022 Dr. Doreene Burke MD (PCP) No Medication changes noted, Ambulatory referral to Psychiatry  09/02/2022 Dr. Janee Morn MD (PCP Office) No Medication changes noted 07/22/2022 Dr, Doreene Burke MD (PCP) Patient was advised to use the potassium only as needed and no more than once or twice weekly , Return in about 3 months  06/26/2022 Dr. Doreene Burke MD (PCP) No Medication changes noted, Return in about 3 months  06/16/2022 Dr. Doreene Burke MD (PCP) No Medication changes noted 05/06/2022 Dr. Janee Morn MD (PCP Office) No Medication changes noted, Ambulatory referral to Vascular Surgery  04/23/2022 Dr. Doreene Burke MD (PCP) Restart Metformin and Atorvastatin, Patient reports she not taking Furosemide, Return in about 6 months    Recent consult visits:  09/30/2022 Dr. Magnus Ivan MD (Orthopedic Surgery) Start diclofenac Sodium (VOLTAREN) 1 % GEL 09/24/2022 Cristie Hem DPM (Podiatry) No medication changes  noted 09/23/2022 Scot Jun PA-C (Otolaryngology) No Medication changes noted, return in 1 year 09/23/2022 Darryll Capers (Audiology) Unable to see note 09/17/2022 Richardean Canal PA-C (Orthopedic Surgery) No Medication changes noted 09/02/2022 Dr. Magnus Ivan MD (Orthopedic Surgery) Start celecoxib (CELEBREX) 200 MG capsule, Return in about 4 weeks  08/24/2022 Dr. Henderson Cloud MD ([facility] ) No Medication changes noted 08/18/2022 Dr. Vincente Poli MD ([facility] )No Medication changes noted 08/11/2022 Dr. Henderson Cloud MD ([facility] ) No Medication changes noted 07/14/2022 [facility])No Medication changes noted 07/08/2022 Cristie Hem DPM (Podiatry) No Medication changes noted 06/11/2022 Seymour Bars DO [facility]) No medication changes noted 06/08/2022 Cristie Hem DPM (Podiatry) No Medication changes noted 05/20/2022 Cristie Hem DPM (Podiatry) No Medication changes noted 04/24/2022 Dr. Seymour Bars MD (Gynecology) No Medication changes noted, Refer to Psychiatry,Refer to [facility].    Hospital visits:  Medication Reconciliation was completed by comparing discharge summary, patient's EMR and Pharmacy list, and upon discussion with patient.  Admitted to the hospital on 10/09/2022 due to leg swelling. Discharge date was 10/09/2022. Discharged from [facility].    New?Medications Started at [facility] Discharge:?? -started  ***  Medication Changes at Hospital Discharge: -Changed ***  Medications Discontinued at Hospital Discharge: -Stopped ***  Medications that remain the same after Hospital Discharge:??  -All other medications will remain the same.   Admitted to the hospital on 10/07/2022 due to Paranoia. Discharge date was 10/07/2022. Discharged from [facility].    New?Medications Started at [facility] Discharge:?? -started None ID  Medication Changes  at Hospital Discharge: -Changed None ID  Medications Discontinued at Hospital Discharge: -Stopped  None ID  Medications that remain the same after Hospital Discharge:??  -All other medications will remain the same.   Admitted to the hospital on 10/04/2022 due to Weakness. Discharge date was 10/04/2022. Discharged from [facility].    New?Medications Started at [facility] Discharge:?? -started None ID  Medication Changes at Hospital Discharge: -Changed None ID  Medications Discontinued at Hospital Discharge: -Stopped None ID  Medications that remain the same after Hospital Discharge:??  -All other medications will remain the same.   Admitted to the hospital on 10/02/2022 due to Fall. Discharge date was 10/02/2022. Discharged from [facility].  Patient left before being seen.   Admitted to the hospital on 08/30/2022 due to Right elbow pain. Discharge date was 08/30/2022. Discharged from [facility].    New?Medications Started at [facility] Discharge:?? -started gabapentin (NEURONTIN) 300 MG capsule daily at bedtime  Medication Changes at Hospital Discharge: -Changed None ID  Medications Discontinued at Hospital Discharge: -Stopped None ID  Medications that remain the same after Hospital Discharge:??  -All other medications will remain the same.   Admitted to the hospital on 08/14/2022  due to Venous stasis dermatitis of both lower extremities . Discharge date was 08/15/2022. Discharged from [facility].    New?Medications Started at [facility] Discharge:?? -started Zanaflex 2 mg muscle relaxer every 8 hours as needed   Medication Changes at Hospital Discharge: -Changed None ID  Medications Discontinued at Hospital Discharge: -Stopped triamcinolone   Medications that remain the same after Hospital Discharge:??  -All other medications will remain the same.   Admitted to the hospital on 08/07/2022 due to Weakness. Discharge date  was 08/07/2022. Discharged from [facility].    New?Medications Started at [facility] Discharge:?? -started None ID  Medication Changes at Hospital Discharge: -Changed None ID  Medications Discontinued at Hospital Discharge: -Stopped None ID  Medications that remain the same after Hospital Discharge:??  -All other medications will remain the same.   Admitted to the hospital on 08/03/2022 due to Right hand pain. Discharge date was 08/03/2022. Discharged from [facility] Urgent care [facility].    New?Medications Started at [facility] Discharge:?? -started None ID  Medication Changes at Hospital Discharge: -Changed None ID  Medications Discontinued at Hospital Discharge: -Stopped None ID  Medications that remain the same after Hospital Discharge:??  -All other medications will remain the same.   Admitted to the hospital on 05/30/2022 due to Leg pain. Discharge date was 05/30/2022. Discharged from [facility].    New?Medications Started at [facility] Discharge:?? -started None ID   Medication Changes at Hospital Discharge: -Changed None ID  Medications Discontinued at Hospital Discharge: -Stopped None ID  Medications that remain the same after Hospital Discharge:??  -All other medications will remain the same.   Admitted to the hospital on 05/17/2022 due to Unknown. Discharge date was 05/17/2022. Discharged from [facility].- Patient left without be seen.     Admitted to the hospital on 04/26/2022 due to Diverticulitis . Discharge date was 04/27/2022. Discharged from [facility].    New?Medications Started at [facility] Discharge:?? -started ciprofloxacin 500 MG tablet   -Started metronidazole 500 MG tablet   Medication Changes at Hospital Discharge: -Changed None ID  Medications Discontinued at Hospital Discharge: -Stopped cephalexin   Medications that remain the same after Hospital Discharge:??  -All other  medications will remain the same.    Admitted to the hospital on 04/17/2022 due to Left leg pain. Discharge date was 04/17/2022. Discharged  from [facility].    New?Medications Started at [facility] Discharge:?? -started None ID  Medication Changes at Hospital Discharge: -Changed None ID  Medications Discontinued at Hospital Discharge: -Stopped None ID  Medications that remain the same after Hospital Discharge:??  -All other medications will remain the same.    Admitted to the hospital on 04/16/2022 due to Cellulitis of left leg. Discharge date was 04/16/2022. Discharged from [facility] health Urgent [facility].    New?Medications Started at [facility] Discharge:?? -started Keflex 500 mg 3 times daily for 5 days  -Started Furosemide 20 mg orally daily   Medication Changes at Hospital Discharge: -Changed None ID  Medications Discontinued at Hospital Discharge: -Stopped None ID  Medications that remain the same after Hospital Discharge:??  -All other medications will remain the same.      Everlean Cherry Clinical Pharmacist Assistant [phone]

## 2022-10-09 NOTE — ED Provider Notes (Signed)
[facility]    CSN: [id number] Arrival date & time: 10/09/22  1016      History   Chief Complaint Chief Complaint  Patient presents with   Leg Swelling    HPI Bianca Medina is a 72 y.o. female.   Patient presents to clinic for bilateral lower extremity edema.  Edema has been ongoing for the past few weeks, she reports it is improving since her emergency department visit on Sunday.  Reports her ankles are sore and tight.  She also has some numbness and tingling in her hands, mostly her right hand and her palm in her middle 3 fingers.  She was prescribed gabapentin to the emergency department to take at bedtime nightly, recently finished this about a week ago.  She denies any fevers, chest pain or shortness of breath.  Denies any new falls or injuries.  She is wearing compression stockings.  She does have an upcoming appointment with cardiology today at 1.    The history is provided by the patient and medical records.    Past Medical History:  Diagnosis Date   Adenomatous colon polyp 2021   Anemia 2003   IRON DEFICIENCY    Asthma    Breast cancer (HCC)    right breast   Chronic anal fissure    Common migraine 06/23/2019   Diabetes mellitus without complication (HCC)    GERD (gastroesophageal reflux disease)    Hearing loss    Hyperlipidemia, mild    Hypertension    IDA (iron deficiency anemia)    NSVD (normal spontaneous vaginal delivery)    X3   Obesity    PMDD (premenstrual dysphoric disorder)    Sensorineural hearing loss (SNHL) of both ears    Sleep disorder     Patient Active Problem List   Diagnosis Date Noted   Venous stasis of both lower extremities 09/04/2022   Arthritis of right knee 09/04/2022   Follow-up medical care requested by patient 09/04/2022   Concerned about care plan 09/04/2022   Vitamin E deficiency 07/22/2022   Leg cramps 07/22/2022   Facial droop 06/16/2022   Pain of left upper extremity 06/16/2022   Right knee pain  06/11/2022   Mood disorder (HCC) 06/11/2022   Diverticulitis 04/26/2022   Cellulitis 04/26/2022   Leukocytosis 04/26/2022   Invasive ductal carcinoma of breast (HCC) 04/26/2022   Type 2 diabetes mellitus with complication, without long-term current use of insulin (HCC) 04/26/2022   Slow transit constipation 01/26/2022   Left leg swelling 11/10/2021   Hyperlipidemia 11/10/2021   Bruising 06/11/2021   Other schizoaffective disorders (HCC) 04/30/2021   Thyroid nodule 04/30/2021   Screening for colon cancer 04/01/2021   Insect bite of right wrist 04/01/2021   Bite, insect 04/01/2021   Rash 03/28/2021   Iron deficiency 03/19/2021   Need for influenza vaccination 03/19/2021   Snoring 01/27/2021   Sleep related bruxism 01/27/2021   Gasping for breath 01/27/2021   Insomnia due to anxiety and fear 01/27/2021   Class 3 severe obesity due to excess calories with serious comorbidity and body mass index (BMI) of 40.0 to 44.9 in adult [facility]) 01/27/2021   Medicare annual wellness visit, subsequent 01/24/2021   Dehydration 12/26/2020   History of syncope 12/26/2020   Hospital discharge follow-up 12/26/2020   Healthcare maintenance 12/01/2020   Vitamin D deficiency 12/01/2020   Ear itching 06/27/2019   Common migraine 06/23/2019   History of anemia 03/31/2019   Pre-diabetes 03/31/2019   Viral syndrome 03/31/2019  Sensorineural hearing loss (SNHL), bilateral 08/12/2017   Tinnitus of both ears 08/12/2017   Morbid obesity (HCC) 12/28/2016   Left breast mass 07/19/2014   Breast tenderness in female 07/19/2014   Menopause 01/09/2014   Vaginal atrophy 01/09/2014   Skin lesion 12/23/2012   Essential hypertension 10/31/2012   Heartburn 10/31/2012    Past Surgical History:  Procedure Laterality Date   ABDOMINAL HYSTERECTOMY     BLADDER REPAIR     BREAST BIOPSY Right    2023   COLONOSCOPY  11/2019   1 hpp and 1 tubl adenoma   COLONOSCOPY WITH PROPOFOL N/A 12/03/2021   Procedure:  COLONOSCOPY WITH PROPOFOL;  Surgeon: Earline Mayotte, MD;  Location: [facility] ENDOSCOPY;  Service: Endoscopy;  Laterality: N/A;   GUM SURGERY  1980's   MANDIBLE FRACTURE SURGERY     TUBAL LIGATION      OB History     Gravida  3   Para  3   Term  3   Preterm      AB      Living  3      SAB      IAB      Ectopic      Multiple      Live Births               Home Medications    Prior to Admission medications   Medication Sig Start Date End Date Taking? Authorizing Provider  aspirin EC 81 MG tablet Take 81 mg by mouth daily. Swallow whole.    [provider]  atorvastatin (LIPITOR) 10 MG tablet Take 1 tablet (10 mg total) by mouth daily. 04/23/22   [name], MD  Biotin 5000 MCG CAPS Take 5,000 mcg by mouth daily.     [provider]  celecoxib (CELEBREX) 200 MG capsule Take 1 capsule (200 mg total) by mouth 2 (two) times daily between meals as needed. 09/02/22   Kathryne Hitch, MD  chlorthalidone (HYGROTON) 25 MG tablet TAKE 1 TABLET (25 MG TOTAL) BY MOUTH DAILY. 09/14/22   [name], MD  diclofenac Sodium (VOLTAREN) 1 % GEL Apply 2 g topically 4 (four) times daily. 09/30/22   Kirtland Bouchard, PA-C  fluticasone (FLONASE) 50 MCG/ACT nasal spray Place 2 sprays into both nostrils daily. 05/13/21   [name], MD  furosemide (LASIX) 20 MG tablet Take 1 tablet (20 mg total) by mouth daily as needed for edema. 09/04/22 10/04/22  [name], MD  gabapentin (NEURONTIN) 300 MG capsule Take 1 capsule (300 mg total) by mouth at bedtime for 20 doses. 08/30/22 09/19/22  Harlene Salts A, PA-C  ibuprofen (ADVIL) 600 MG tablet Take 1 tablet (600 mg total) by mouth every 8 (eight) hours as needed. 08/31/22   [name], MD  losartan (COZAAR) 25 MG tablet Take 25 mg by mouth daily. 07/23/21   [provider]  metFORMIN (GLUCOPHAGE-XR) 500 MG 24 hr tablet TAKE 1 TABLET BY MOUTH EVERY DAY WITH  BREAKFAST Patient taking differently: Take 500 mg by mouth daily with breakfast. 04/23/22   [name], MD  montelukast (SINGULAIR) 10 MG tablet Take 10 mg by mouth daily. 12/08/21   [provider]  Potassium Chloride ER 20 MEQ TBCR Take 1 tablet by mouth daily. 11/10/21   [provider]  PROAIR HFA 108 (90 Base) MCG/ACT inhaler Inhale 1-2 puffs into the lungs every 4 (four) hours as needed for wheezing or shortness  of breath.  03/16/18   [provider]  SYMBICORT 80-4.5 MCG/ACT inhaler Inhale 2 puffs into the lungs 2 (two) times daily. 12/08/21   [provider]  tizanidine (ZANAFLEX) 2 MG capsule Take 1 capsule (2 mg total) by mouth 3 (three) times daily. 08/15/22   Carlisle Beers, FNP  triamcinolone cream (KENALOG) 0.1 % Apply topically 2 (two) times daily. 05/13/22   [provider]  Vitamin D, Ergocalciferol, (DRISDOL) 1.25 MG (50000 UNIT) CAPS capsule TAKE 1 CAPSULE BY MOUTH ONE TIME PER WEEK 08/06/22   [name], MD  vitamin E 1000 UNIT capsule Take 1,000 Units by mouth daily.    [provider]    Family History Family History  Problem Relation Age of Onset   Cancer Mother        LUNG- SMOKER   Diabetes Father    Hypertension Father    Heart disease Father    Stroke Father    Breast cancer Sister        lates 67s   Cancer Paternal Aunt        COLON   Cancer Paternal Uncle        COLON    Social History Social History   Tobacco Use   Smoking status: Never   Smokeless tobacco: Never  Vaping Use   Vaping Use: Never used  Substance Use Topics   Alcohol use: Not Currently   Drug use: Never     Allergies   Codeine   Review of Systems Review of Systems  Constitutional:  Negative for fatigue and fever.  Respiratory:  Negative for cough and shortness of breath.   Cardiovascular:  Positive for leg swelling. Negative for chest pain and palpitations.  Skin:  Negative for color change,  pallor, rash and wound.     Physical Exam Triage Vital Signs ED Triage Vitals  Enc Vitals Group     BP 10/09/22 1029 (!) 141/76     Pulse Rate 10/09/22 1029 68     Resp 10/09/22 1029 18     Temp 10/09/22 1029 97.7 F (36.5 C)     Temp Source 10/09/22 1029 Oral     SpO2 10/09/22 1029 96 %     Weight 10/09/22 1032 270 lb (122.5 kg)     Height 10/09/22 1032 5\' 5"  (1.651 m)     Head Circumference --      Peak Flow --      Pain Score 10/09/22 1029 4     Pain Loc --      Pain Edu? --      Excl. in GC? --    No data found.  Updated Vital Signs BP (!) 141/76   Pulse 68   Temp 97.7 F (36.5 C) (Oral)   Resp 18   Ht 5\' 5"  (1.651 m)   Wt 270 lb (122.5 kg)   SpO2 96%   BMI 44.93 kg/m   Visual Acuity Right Eye Distance:   Left Eye Distance:   Bilateral Distance:    Right Eye Near:   Left Eye Near:    Bilateral Near:     Physical Exam   UC Treatments / Results  Labs (all labs ordered are listed, but only abnormal results are displayed) Labs Reviewed - No data to display  EKG   Radiology No results found.  Procedures Procedures (including critical care time)  Medications Ordered in UC Medications - No data to display  Initial Impression / Assessment and  Plan / UC Course  I have reviewed the triage vital signs and the nursing notes.  Pertinent labs & imaging results that were available during my care of the patient were reviewed by me and considered in my medical decision making (see chart for details).  Vitals and triage reviewed, patient is hemodynamically stable.  Reports bilateral pitting edema in lower extremities and numbness and tingling to her hands. Strength 5/5 in upper and lower extremities. Radial and pedal pulses 2+ bilaterally with brisk capillary refill. 2+ pitting edema to BLE.  Without warmth or streaking, low concern for cellulitis.  Encouraged to follow-up with cardiology for medication management, was good to be started on Lasix, however,  due to the interaction of potential lactic acidosis with metformin, the emergency department advised her not to start this.  She recently came off gabapentin, could be contributing to her peripheral edema and nerve pain.  Encouraged to follow-up with cardiology, given strict emergency precautions, no questions at this time.    Final Clinical Impressions(s) / UC Diagnoses   Final diagnoses:  Peripheral edema  Venous stasis     Discharge Instructions      Please continue with the compression stockings, rest and elevate your extremities to help with the swelling.  It is important that you keep your follow-up with cardiology.  The gabapentin could have potentially exacerbated your peripheral edema.   Please seek immediate care if you develop chest pain, shortness of breath, syncope, or any new concerning symptoms.     ED Prescriptions   None    PDMP not reviewed this encounter.   Lynne Takemoto, [place], [place] 10/09/22 1105

## 2022-10-11 ENCOUNTER — Other Ambulatory Visit: Payer: Self-pay | Admitting: Family Medicine

## 2022-10-11 DIAGNOSIS — M25561 Pain in right knee: Secondary | ICD-10-CM

## 2022-10-14 DIAGNOSIS — M25551 Pain in right hip: Secondary | ICD-10-CM | POA: Diagnosis not present

## 2022-10-14 DIAGNOSIS — M79601 Pain in right arm: Secondary | ICD-10-CM | POA: Diagnosis not present

## 2022-10-14 DIAGNOSIS — M25561 Pain in right knee: Secondary | ICD-10-CM | POA: Diagnosis not present

## 2022-10-14 DIAGNOSIS — R262 Difficulty in walking, not elsewhere classified: Secondary | ICD-10-CM | POA: Diagnosis not present

## 2022-10-15 ENCOUNTER — Telehealth: Payer: Self-pay

## 2022-10-15 ENCOUNTER — Ambulatory Visit: Payer: Medicare PPO

## 2022-10-15 VITALS — BP 118/70

## 2022-10-15 DIAGNOSIS — E785 Hyperlipidemia, unspecified: Secondary | ICD-10-CM

## 2022-10-15 DIAGNOSIS — I10 Essential (primary) hypertension: Secondary | ICD-10-CM

## 2022-10-15 DIAGNOSIS — E118 Type 2 diabetes mellitus with unspecified complications: Secondary | ICD-10-CM

## 2022-10-15 MED ORDER — METFORMIN HCL ER 500 MG PO TB24
500.0000 mg | ORAL_TABLET | Freq: Two times a day (BID) | ORAL | 1 refills | Status: DC
Start: 2022-10-15 — End: 2022-10-22

## 2022-10-15 NOTE — Progress Notes (Signed)
Care Management & Coordination Services Pharmacy Note  10/15/2022 Name:  Bianca Medina MRN:  [id number] DOB:  06-24-1950  Summary: Patient presents for initial pharmacy consult.    -Patient does not monitor blood pressure at home. Blood pressure has been mildly elevated at last few office visits, but is controlled today.   -Patient's blood sugars remain elevated. She is not thrilled with taking metformin, but does report adherence. We discussed GLP-1 agonists and their role at lowering blood sugar and weight loss, but patient was hesitant today and preferred to increase her metformin instead.   Interested in cardiology referral  Declines psychiatry referral   Recommendations/Changes made from today's visit: Continue current medications   Follow up plan: HC BP assessment in 2 weeks.  CPP follow-up 1 month    Patient-Specific Goals:  -Goal: Maintain blood pressure <130/80 over next 90 days by monitoring blood pressure weekly and recording.   Recommended Problem List Changes:  Add:  Mild intermittent asthma, uncomplicated  Subjective: Bianca Medina is an 72 y.o. year old female who is a primary patient of [name], MD.  The care coordination team was consulted for assistance with disease management and care coordination needs.    Engaged with patient face to face for initial visit.  Recent office visits: 09/24/2022 Dr. Doreene Burke MD (PCP) No Medication changes noted, Ambulatory referral to Psychiatry  09/02/2022 Dr. Janee Morn MD (PCP Office) No Medication changes noted 07/22/2022 Dr, Doreene Burke MD (PCP) Patient was advised to use the potassium only as needed and no more than once or twice weekly , Return in about 3 months  06/26/2022 Dr. Doreene Burke MD (PCP) No Medication changes noted, Return in about 3 months  06/16/2022 Dr. Doreene Burke MD (PCP) No Medication changes noted 05/06/2022 Dr. Janee Morn MD (PCP Office) No Medication changes noted, Ambulatory referral to Vascular  Surgery  04/23/2022 Dr. Doreene Burke MD (PCP) Restart Metformin and Atorvastatin, Patient reports she not taking Furosemide, Return in about 6 months   Recent consult visits: 09/30/2022 Dr. Magnus Ivan MD (Orthopedic Surgery) Start diclofenac Sodium (VOLTAREN) 1 % GEL 09/24/2022 Cristie Hem DPM (Podiatry) No medication changes noted 09/23/2022 Scot Jun PA-C (Otolaryngology) No Medication changes noted, return in 1 year 09/23/2022 Darryll Capers (Audiology) Unable to see note 09/17/2022 Richardean Canal PA-C (Orthopedic Surgery) No Medication changes noted 09/02/2022 Dr. Magnus Ivan MD (Orthopedic Surgery) Start celecoxib (CELEBREX) 200 MG capsule, Return in about 4 weeks  08/24/2022 Dr. Henderson Cloud MD ([facility] ) No Medication changes noted 08/18/2022 Dr. Vincente Poli MD ([facility] )No Medication changes noted 08/11/2022 Dr. Henderson Cloud MD ([facility] ) No Medication changes noted 07/14/2022 Willis Modena [facility])No Medication changes noted 07/08/2022 Cristie Hem DPM (Podiatry) No Medication changes noted 06/11/2022 Seymour Bars DO [facility]) No medication changes noted 06/08/2022 Cristie Hem DPM (Podiatry) No Medication changes noted 05/20/2022 Cristie Hem DPM (Podiatry) No Medication changes noted 04/24/2022 Dr. Seymour Bars MD (Gynecology) No Medication changes noted, Refer to Psychiatry,Refer to [facility].    Hospital visits: Admitted to the hospital on 10/09/2022 due to leg swelling. Discharge date was 10/09/2022. Discharged from [facility].   Admitted to the hospital on 10/07/2022 due to Paranoia. Discharge date was 10/07/2022. Discharged from [facility].    Admitted to the hospital on 10/04/2022 due to Weakness. Discharge date was 10/04/2022. Discharged from [facility].   Admitted to the hospital on 10/02/2022 due to Fall. Discharge  date was  10/02/2022. Discharged from [facility].  Patient left before being seen.   Admitted to the hospital on 08/30/2022 due to Right elbow pain. Discharge date was 08/30/2022. Discharged from [facility].    Admitted to the hospital on 08/14/2022  due to Venous stasis dermatitis of both lower extremities . Discharge date was 08/15/2022. Discharged from [facility].   Admitted to the hospital on 08/07/2022 due to Weakness. Discharge date was 08/07/2022. Discharged from [facility].    Admitted to the hospital on 08/03/2022 due to Right hand pain. Discharge date was 08/03/2022. Discharged from [facility] Urgent care [facility].    Admitted to the hospital on 05/30/2022 due to Leg pain. Discharge date was 05/30/2022. Discharged from [facility].    Admitted to the hospital on 04/26/2022 due to Diverticulitis . Discharge date was 04/27/2022. Discharged from [facility].   Admitted to the hospital on 04/17/2022 due to Left leg pain. Discharge date was 04/17/2022. Discharged from [facility].   Admitted to the hospital on 04/16/2022 due to Cellulitis of left leg. Discharge date was 04/16/2022. Discharged from [facility].    Objective:  Lab Results  Component Value Date   CREATININE 0.73 10/07/2022   BUN 14 10/07/2022   GFR 78.19 09/02/2022   EGFR 75 08/04/2021   GFRNONAA >60 10/07/2022   GFRAA >60 11/20/2019   NA 137 10/07/2022   K 3.6 10/07/2022   CALCIUM 8.8 (L) 10/07/2022   CO2 27 10/07/2022   GLUCOSE 155 (H) 10/07/2022    Lab Results  Component Value Date/Time   HGBA1C 7.5 (H) 04/23/2022 11:25 AM   HGBA1C 7.0 (H) 11/10/2021 09:53 AM   GFR 78.19 09/02/2022 01:52 PM   GFR 81.26 09/03/2021 12:19 PM   MICROALBUR <0.7 04/23/2022 11:25 AM   MICROALBUR 1.1 03/19/2021 11:38 AM    Last diabetic Eye exam:  Lab Results  Component Value Date/Time   HMDIABEYEEXA No Retinopathy  07/03/2021 12:00 AM   HMDIABEYEEXA No Retinopathy 07/03/2021 12:00 AM    Last diabetic Foot exam: No results found for: "HMDIABFOOTEX"   Lab Results  Component Value Date   CHOL 163 03/19/2021   HDL 42.70 03/19/2021   LDLCALC 105 (H) 03/19/2021   LDLDIRECT 136.0 09/03/2021   TRIG 75.0 03/19/2021   CHOLHDL 4 03/19/2021       Latest Ref Rng & Units 10/07/2022    9:11 AM 10/04/2022    9:11 AM 08/07/2022    9:07 PM  Hepatic Function  Total Protein 6.5 - 8.1 g/dL 6.5  6.7  6.4   Albumin 3.5 - 5.0 g/dL 3.1  3.1  3.0   AST 15 - 41 U/L 17  18  15    ALT 0 - 44 U/L 19  17  16    Alk Phosphatase 38 - 126 U/L 84  80  87   Total Bilirubin 0.3 - 1.2 mg/dL 1.0  0.6  0.7     Lab Results  Component Value Date/Time   TSH 0.34 (L) 09/02/2022 01:52 PM   TSH 0.757 08/04/2021 03:09 PM       Latest Ref Rng & Units 10/07/2022    9:11 AM 10/04/2022    9:11 AM 10/02/2022    4:34 PM  CBC  WBC 4.0 - 10.5 K/uL 9.2  10.2  10.1   Hemoglobin 12.0 - 15.0 g/dL 16.1  09.6  04.5   Hematocrit 36.0 - 46.0 %  37.0  38.2  36.3   Platelets 150 - 400 K/uL 258  273  264     Lab Results  Component Value Date/Time   VD25OH 31.60 07/22/2022 08:43 AM   VD25OH 34.55 09/03/2021 12:19 PM   VITAMINB12 556 08/07/2022 09:07 PM    Clinical ASCVD: No  The 10-year ASCVD risk score (Arnett DK, et al., 2019) is: 25.8%   Values used to calculate the score:     Age: 89 years     Sex: Female     Is Non-Hispanic African American: Yes     Diabetic: Yes     Tobacco smoker: No     Systolic Blood Pressure: 141 mmHg     Is BP treated: Yes     HDL Cholesterol: 42.7 mg/dL     Total Cholesterol: 163 mg/dL       06/18/3084    5:78 AM 06/26/2022    2:35 PM 06/16/2022    3:17 PM  Depression screen PHQ 2/9  Decreased Interest 0 0 0  Down, Depressed, Hopeless 0 0 0  PHQ - 2 Score 0 0 0     Social History   Tobacco Use  Smoking Status Never  Smokeless Tobacco Never   BP Readings from Last 3 Encounters:  10/09/22 (!)  141/76  10/07/22 (!) 136/92  10/04/22 128/68   Pulse Readings from Last 3 Encounters:  10/09/22 68  10/07/22 69  10/04/22 76   Wt Readings from Last 3 Encounters:  10/09/22 270 lb (122.5 kg)  10/07/22 270 lb 1 oz (122.5 kg)  10/02/22 270 lb (122.5 kg)   BMI Readings from Last 3 Encounters:  10/09/22 44.93 kg/m  10/07/22 44.94 kg/m  10/02/22 44.93 kg/m    Allergies  Allergen Reactions   Codeine Other (See Comments)    nightmares    Medications Reviewed Today     Reviewed by Ludwig Lean, RN (Registered Nurse) on 10/09/22 at 1033  Med List Status: <None>   Medication Order Taking? Sig Documenting Provider Last Dose Status Informant  aspirin EC 81 MG tablet [id number]  Take 81 mg by mouth daily. Swallow whole. [provider]  Active Self, Pharmacy Records  atorvastatin (LIPITOR) 10 MG tablet [id number]  Take 1 tablet (10 mg total) by mouth daily. [name], MD  Active Self, Pharmacy Records  Biotin 5000 MCG CAPS [id number]  Take 5,000 mcg by mouth daily.  [provider]  Active Self, Pharmacy Records  celecoxib (CELEBREX) 200 MG capsule [id number]  Take 1 capsule (200 mg total) by mouth 2 (two) times daily between meals as needed. Kathryne Hitch, MD  Active   chlorthalidone (HYGROTON) 25 MG tablet [id number]  TAKE 1 TABLET (25 MG TOTAL) BY MOUTH DAILY. [name], MD  Active   diclofenac Sodium (VOLTAREN) 1 % GEL [id number]  Apply 2 g topically 4 (four) times daily. Kirtland Bouchard, PA-C  Active   fluticasone (FLONASE) 50 MCG/ACT nasal spray [id number]  Place 2 sprays into both nostrils daily. [name], MD  Active Self, Pharmacy Records  furosemide (LASIX) 20 MG tablet [id number]  Take 1 tablet (20 mg total) by mouth daily as needed for edema. [name], MD  Expired 10/04/22 2359   gabapentin (NEURONTIN) 300 MG capsule [id number]  Take 1 capsule (300 mg total) by mouth at bedtime for 20 doses.  Harlene Salts A, PA-C  Expired 09/19/22 2359   ibuprofen (ADVIL) 600 MG tablet [id number]  Take 1 tablet (  600 mg total) by mouth every 8 (eight) hours as needed. [name], MD  Active   losartan (COZAAR) 25 MG tablet [id number]  Take 25 mg by mouth daily. [provider]  Active Self, Pharmacy Records  metFORMIN (GLUCOPHAGE-XR) 500 MG 24 hr tablet [id number]  TAKE 1 TABLET BY MOUTH EVERY DAY WITH BREAKFAST  Patient taking differently: Take 500 mg by mouth daily with breakfast.   [name], MD  Active Self, Pharmacy Records  montelukast (SINGULAIR) 10 MG tablet [id number]  Take 10 mg by mouth daily. [provider]  Active Self, Pharmacy Records  Potassium Chloride ER 20 MEQ TBCR [id number]  Take 1 tablet by mouth daily. [provider]  Active Self, Pharmacy Records  PROAIR HFA 108 [phone] Base) MCG/ACT inhaler [id number]  Inhale 1-2 puffs into the lungs every 4 (four) hours as needed for wheezing or shortness of breath.  [provider]  Active Self, Pharmacy Records  SYMBICORT 80-4.5 MCG/ACT inhaler [id number]  Inhale 2 puffs into the lungs 2 (two) times daily. [provider]  Active Self, Pharmacy Records  tizanidine (ZANAFLEX) 2 MG capsule [id number]  Take 1 capsule (2 mg total) by mouth 3 (three) times daily. Carlisle Beers, FNP  Active   triamcinolone cream (KENALOG) 0.1 % [id number]  Apply topically 2 (two) times daily. [provider]  Active   Vitamin D, Ergocalciferol, (DRISDOL) 1.25 MG (50000 UNIT) CAPS capsule [id number]  TAKE 1 CAPSULE BY MOUTH ONE TIME PER WEEK [name], MD  Active   vitamin E 1000 UNIT capsule [id number]  Take 1,000 Units by mouth daily. [provider]  Active Self, Pharmacy Records            SDOH:  (Social Determinants of Health) assessments and interventions performed: Yes SDOH Interventions    Flowsheet Row Clinical Support from 03/31/2022 in [facility]  [facility] [employer]  SDOH Interventions   Food Insecurity Interventions Intervention Not Indicated  Transportation Interventions Intervention Not Indicated  Financial Strain Interventions Intervention Not Indicated  Physical Activity Interventions Patient Refused, Other (Comments)  Stress Interventions Intervention Not Indicated       Medication Assistance: None required.  Patient affirms current coverage meets needs.  Medication Access: Within the past 30 days, how often has patient missed a dose of medication? None Is a pillbox or other method used to improve adherence? No  Factors that may affect medication adherence? no barriers identified Are meds synced by current pharmacy? No  Are meds delivered by current pharmacy? No  Does patient experience delays in picking up medications due to transportation concerns? No   Compliance/Adherence/Medication fill history: Care Gaps: Annual wellness visit in last year? Yes Last completed 03/31/2022 Mammogram  If Diabetic: Last eye exam / retinopathy screening: Last completed 07/03/2021 Last diabetic foot exam:Last completed 07/22/2022  Star-Rating Drugs: Atorvastatin 10 mg Last Fill: 03/13/2022 [facility]. Losartan 25 mg Last Fill: 07/14/2022 [facility] Day [facility] at [facility]. Metformin 500 mg Last Fill: 07/01/2022 90 Day Supply at [facility].   Assessment/Plan   Hypertension (BP goal <130/80) -Controlled -Current treatment: Chlorthalidone 25 mg daily   Losartan 25 mg daily -Medications previously tried: NA  -Current home readings: Does not monitor at home.  -Blood pressure has been mildly elevated at last few office visits, but is controlled today.  -Denies hypotensive/hypertensive symptoms -Goal: Maintain blood pressure <130/80 over next 90 days by monitoring blood pressure weekly and recording.  -Recommended to continue current  medication  Hyperlipidemia: (LDL goal <  70) -Uncontrolled -Current treatment: Atorvastatin 10 mg daily -Current treatment: Aspirin 81 mg  -Medications previously tried: NA  -Defer medication changes for now.   Diabetes (A1c goal <7%) -Uncontrolled -Current medications: Metformin XR 500 mg daily  -Medications previously tried: NA  -Current home glucose readings: Does not monitor at home due to fear of sticking herself. She does not qualify for CGM. - Denies hypoglycemic/hyperglycemic symptoms -Current meal patterns: works with a weight loss clinic on healthy eating habits, portion control.  -Current exercise habits: Physical Therapy twice weekly. Was previously walking but leg weakness has limited that significantly for her.  -Educated on A1c and blood sugar goals; Complications of diabetes including kidney damage, retinal damage, and cardiovascular disease; -Counseled to check feet daily and get yearly eye exams. She is due for her eye exam. -Patient is not thrilled with taking metformin. We discussed GLP-1 agonists and their role at lowering blood sugar and weight loss, but patient was hesitant today and preferred to increase her metformin instead.  -INCREASE Metformin to 500 mg twice daily   Query Asthma (Goal: control symptoms and prevent exacerbations) -Controlled -Current treatment  Albuterol Symbicort -Medications previously tried: NA  -Exacerbations requiring treatment in last 6 months: None -Patient reports consistent use of maintenance inhaler -Frequency of rescue inhaler use: Infrequent -Recommended to continue current medication  Follow Up Plan: Telephone follow up appointment with care management team member scheduled for:  11/05/22 at 10:00 AM  Angelena Sole, PharmD, Patsy Baltimore, CPP Clinical Pharmacist Practitioner  [facility]  [phone]

## 2022-10-15 NOTE — Patient Instructions (Addendum)
Visit Information It was great speaking with you today!  Please let me know if you have any questions about our visit.  Plan:  You are due for your Diabetic Eye Exam and a Mammogram  We will increase your metformin to 500 mg with breakfast and supper  Please plan to check your blood pressure around once weekly and record that result.   Print copy of patient instructions, educational materials, and care plan provided in person.  Telephone follow up appointment with pharmacy team member scheduled for: 11/05/2022 at 10:00 AM  [facility] Clinical Pharmacist Assistant  Angelena Sole, PharmD, BCACP, CPP Clinical Pharmacist Practitioner  [facility]   [phone]

## 2022-10-15 NOTE — Progress Notes (Signed)
Per Clinical pharmacist,patient called the clinic requesting my follow-up be rescheduled. Please reach out to her and find a different time for her follow-up?   I have attempted 3 times  without success to contact this patient by phone to reschedule her appointment on 11/05/2022.I left a message informing the patient I reschedule it to 11/12/2022 @ 9:00 am via phone. I also left my contact information incase she needs to return my call to reschedule.  Everlean Cherry Clinical Pharmacist Assistant [phone]

## 2022-10-20 ENCOUNTER — Encounter: Payer: Self-pay | Admitting: Physician Assistant

## 2022-10-20 ENCOUNTER — Ambulatory Visit: Payer: Medicare PPO | Admitting: Physician Assistant

## 2022-10-20 DIAGNOSIS — R262 Difficulty in walking, not elsewhere classified: Secondary | ICD-10-CM | POA: Diagnosis not present

## 2022-10-20 DIAGNOSIS — R531 Weakness: Secondary | ICD-10-CM | POA: Diagnosis not present

## 2022-10-20 DIAGNOSIS — M79601 Pain in right arm: Secondary | ICD-10-CM | POA: Diagnosis not present

## 2022-10-20 DIAGNOSIS — M25561 Pain in right knee: Secondary | ICD-10-CM | POA: Diagnosis not present

## 2022-10-20 NOTE — Progress Notes (Signed)
Office Visit Note   Patient: Bianca Medina           Date of Birth: 1951-01-28           MRN: [id number] Visit Date: 10/20/2022              Requested by: [name], MD [id number] [place],  [place] [address] PCP: [name], MD  Chief Complaint  Patient presents with   Right Forearm - Pain, Follow-up      HPI: Patient is a pleasant 72 year old woman who has been followed by Delane Ginger and Dr. Magnus Ivan for right forearm pain.  She had been previously diagnosed with possible medial epicondylitis.  She comes in today because she feels like her symptoms have gotten worse.  She describes tingling in her long ring and short finger.  She is going to see a neurologist for some of this but feels like they are just doing a sleep study.  She has difficulty now opening jars with her hand.  Denies any other loss of vision any other right-sided symptoms.  She does have a neurology appointment this month.  States the pain goes up and down from the hand to the elbow  Assessment & Plan: Visit Diagnoses:  1. Pain in right arm     Plan: Patient is going to see a neurologist I recommended if she was we would recommend for them to get electrodiagnostic studies of the upper extremity.  She says she would feel more comfortable doing that here.  Need to rule out cubital tunnel versus a more central neurologic reason for her pains in her right arm.  Will follow-up with Delane Ginger or Dr. Magnus Ivan after electrodiagnostic studies exam is  Follow-Up Instructions: No follow-ups on file.   Ortho Exam  Patient is alert, orient, no adenopathy, well-dressed, normal affect, normal respiratory effort. Examination of her right arm she is neurovascular intact she has a strong pulse with brisk capillary refill.  She has a negative Tinel's test negative Phalen's test.  No noted atrophy in her hand.  She does have paresthesias in the long index and short finger no tenderness over the medial  epicondyle  Imaging: No results found. No images are attached to the encounter.  Labs: Lab Results  Component Value Date   HGBA1C 7.5 (H) 04/23/2022   HGBA1C 7.0 (H) 11/10/2021   HGBA1C 6.9 (H) 03/19/2021   ESRSEDRATE 34 06/23/2019   ESRSEDRATE 28 (H) 07/10/2013   REPTSTATUS 03/24/2019 FINAL 03/22/2019   CULT (A) 03/22/2019    20,000 COLONIES/mL MULTIPLE SPECIES PRESENT, SUGGEST RECOLLECTION   LABORGA  07/17/2016    Multiple organisms present,each less than 10,000 CFU/mL. These organisms,commonly found on external and internal genitalia,are considered colonizers. No further testing performed.      Lab Results  Component Value Date   ALBUMIN 3.1 (L) 10/07/2022   ALBUMIN 3.1 (L) 10/04/2022   ALBUMIN 3.0 (L) 08/07/2022    Lab Results  Component Value Date   MG 1.7 09/02/2022   MG 1.8 08/07/2022   Lab Results  Component Value Date   VD25OH 31.60 07/22/2022   VD25OH 34.55 09/03/2021   VD25OH 15.32 (L) 11/27/2020    No results found for: "PREALBUMIN"    Latest Ref Rng & Units 10/07/2022    9:11 AM 10/04/2022    9:11 AM 10/02/2022    4:34 PM  CBC EXTENDED  WBC 4.0 - 10.5 K/uL 9.2  10.2  10.1   RBC 3.87 - 5.11  MIL/uL 4.92  5.03  4.78   Hemoglobin 12.0 - 15.0 g/dL 16.1  09.6  04.5   HCT 36.0 - 46.0 % 37.0  38.2  36.3   Platelets 150 - 400 K/uL 258  273  264   NEUT# 1.7 - 7.7 K/uL 6.7  7.4    Lymph# 0.7 - 4.0 K/uL 1.4  1.8       There is no height or weight on file to calculate BMI.  Orders:  Orders Placed This Encounter  Procedures   Ambulatory referral to Physical Medicine Rehab   No orders of the defined types were placed in this encounter.    Procedures: No procedures performed  Clinical Data: No additional findings.  ROS:  All other systems negative, except as noted in the HPI. Review of Systems  Objective: Vital Signs: There were no vitals taken for this visit.  Specialty Comments:  No specialty comments available.  PMFS  History: Patient Active Problem List   Diagnosis Date Noted   Venous stasis of both lower extremities 09/04/2022   Arthritis of right knee 09/04/2022   Follow-up medical care requested by patient 09/04/2022   Concerned about care plan 09/04/2022   Vitamin E deficiency 07/22/2022   Leg cramps 07/22/2022   Facial droop 06/16/2022   Pain of left upper extremity 06/16/2022   Right knee pain 06/11/2022   Mood disorder (HCC) 06/11/2022   Diverticulitis 04/26/2022   Cellulitis 04/26/2022   Leukocytosis 04/26/2022   Invasive ductal carcinoma of breast (HCC) 04/26/2022   Type 2 diabetes mellitus with complication, without long-term current use of insulin (HCC) 04/26/2022   Slow transit constipation 01/26/2022   Left leg swelling 11/10/2021   Hyperlipidemia 11/10/2021   Bruising 06/11/2021   Other schizoaffective disorders (HCC) 04/30/2021   Thyroid nodule 04/30/2021   Screening for colon cancer 04/01/2021   Insect bite of right wrist 04/01/2021   Bite, insect 04/01/2021   Rash 03/28/2021   Iron deficiency 03/19/2021   Need for influenza vaccination 03/19/2021   Snoring 01/27/2021   Sleep related bruxism 01/27/2021   Gasping for breath 01/27/2021   Insomnia due to anxiety and fear 01/27/2021   Class 3 severe obesity due to excess calories with serious comorbidity and body mass index (BMI) of 40.0 to 44.9 in adult [facility]) 01/27/2021   Medicare annual wellness visit, subsequent 01/24/2021   Dehydration 12/26/2020   History of syncope 12/26/2020   Hospital discharge follow-up 12/26/2020   Healthcare maintenance 12/01/2020   Vitamin D deficiency 12/01/2020   Ear itching 06/27/2019   Common migraine 06/23/2019   History of anemia 03/31/2019   Pre-diabetes 03/31/2019   Viral syndrome 03/31/2019   Sensorineural hearing loss (SNHL), bilateral 08/12/2017   Tinnitus of both ears 08/12/2017   Morbid obesity (HCC) 12/28/2016   Left breast mass 07/19/2014   Breast tenderness in female  07/19/2014   Menopause 01/09/2014   Vaginal atrophy 01/09/2014   Skin lesion 12/23/2012   Essential hypertension 10/31/2012   Heartburn 10/31/2012   Past Medical History:  Diagnosis Date   Adenomatous colon polyp 2021   Anemia 2003   IRON DEFICIENCY    Asthma    Breast cancer (HCC)    right breast   Chronic anal fissure    Common migraine 06/23/2019   Diabetes mellitus without complication (HCC)    GERD (gastroesophageal reflux disease)    Hearing loss    Hyperlipidemia, mild    Hypertension    IDA (iron deficiency anemia)  NSVD (normal spontaneous vaginal delivery)    X3   Obesity    PMDD (premenstrual dysphoric disorder)    Sensorineural hearing loss (SNHL) of both ears    Sleep disorder     Family History  Problem Relation Age of Onset   Cancer Mother        LUNG- SMOKER   Diabetes Father    Hypertension Father    Heart disease Father    Stroke Father    Breast cancer Sister        lates 8s   Cancer Paternal Aunt        COLON   Cancer Paternal Uncle        COLON    Past Surgical History:  Procedure Laterality Date   ABDOMINAL HYSTERECTOMY     BLADDER REPAIR     BREAST BIOPSY Right    2023   COLONOSCOPY  11/2019   1 hpp and 1 tubl adenoma   COLONOSCOPY WITH PROPOFOL N/A 12/03/2021   Procedure: COLONOSCOPY WITH PROPOFOL;  Surgeon: Earline Mayotte, MD;  Location: [facility] ENDOSCOPY;  Service: Endoscopy;  Laterality: N/A;   GUM SURGERY  1980's   MANDIBLE FRACTURE SURGERY     TUBAL LIGATION     Social History   Occupational History   Occupation: Interior and spatial designer: [employer]  Tobacco Use   Smoking status: Never   Smokeless tobacco: Never  Vaping Use   Vaping Use: Never used  Substance and Sexual Activity   Alcohol use: Not Currently   Drug use: Never   Sexual activity: Not Currently    Partners: Male    Birth control/protection: Surgical, Abstinence    Comment: HYSTERECTOMY-1st intercourse 17 yo-5  partners

## 2022-10-21 ENCOUNTER — Encounter: Payer: Self-pay | Admitting: Family Medicine

## 2022-10-22 ENCOUNTER — Ambulatory Visit: Payer: Medicare PPO | Admitting: Family Medicine

## 2022-10-22 ENCOUNTER — Encounter: Payer: Self-pay | Admitting: Family Medicine

## 2022-10-22 VITALS — BP 132/70 | HR 62 | Temp 97.9°F | Ht 65.0 in | Wt 271.2 lb

## 2022-10-22 DIAGNOSIS — E118 Type 2 diabetes mellitus with unspecified complications: Secondary | ICD-10-CM | POA: Diagnosis not present

## 2022-10-22 DIAGNOSIS — Z7984 Long term (current) use of oral hypoglycemic drugs: Secondary | ICD-10-CM | POA: Diagnosis not present

## 2022-10-22 DIAGNOSIS — M25552 Pain in left hip: Secondary | ICD-10-CM | POA: Diagnosis not present

## 2022-10-22 DIAGNOSIS — M25551 Pain in right hip: Secondary | ICD-10-CM | POA: Diagnosis not present

## 2022-10-22 DIAGNOSIS — I10 Essential (primary) hypertension: Secondary | ICD-10-CM

## 2022-10-22 DIAGNOSIS — E559 Vitamin D deficiency, unspecified: Secondary | ICD-10-CM

## 2022-10-22 DIAGNOSIS — M545 Low back pain, unspecified: Secondary | ICD-10-CM | POA: Diagnosis not present

## 2022-10-22 LAB — HEMOGLOBIN A1C: Hgb A1c MFr Bld: 7.9 % — ABNORMAL HIGH (ref 4.6–6.5)

## 2022-10-22 LAB — VITAMIN D 25 HYDROXY (VIT D DEFICIENCY, FRACTURES): VITD: 23.83 ng/mL — ABNORMAL LOW (ref 30.00–100.00)

## 2022-10-22 MED ORDER — METFORMIN HCL ER 500 MG PO TB24: 500.0000 mg | ORAL_TABLET | Freq: Two times a day (BID) | ORAL | 1 refills | Status: DC

## 2022-10-22 MED ORDER — VITAMIN D (ERGOCALCIFEROL) 1.25 MG (50000 UNIT) PO CAPS: ORAL_CAPSULE | ORAL | 2 refills | Status: DC

## 2022-10-22 NOTE — Progress Notes (Signed)
Established Patient Office Visit   Subjective:  Patient ID: Bianca Medina, female    DOB: 09-08-1950  Age: 72 y.o. MRN: [id number]  Chief Complaint  Patient presents with   Medical Management of Chronic Issues    6 month follow up, still some swelling in ankle that come and go.     HPI Encounter Diagnoses  Name Primary?   Vitamin D deficiency Yes   Type 2 diabetes mellitus with complication, without long-term current use of insulin (HCC)    Essential hypertension    Vitamin D deficiency, unspecified    Follow-up of above.  Had started metformin once daily.  Has been tolerating it well.  Saw cardiology recently and they recommended she take it twice daily.  Cardiology increased losartan to 50 mg daily.  Blood pressure is improved.  Has not been taking vitamin D   Review of Systems  Constitutional: Negative.   HENT: Negative.    Eyes:  Negative for blurred vision, discharge and redness.  Respiratory: Negative.    Cardiovascular: Negative.   Gastrointestinal:  Negative for abdominal pain.  Genitourinary: Negative.   Musculoskeletal: Negative.  Negative for myalgias.  Skin:  Negative for rash.  Neurological:  Negative for tingling, loss of consciousness and weakness.  Endo/Heme/Allergies:  Negative for polydipsia.     Current Outpatient Medications:    acetaminophen (TYLENOL) 500 MG tablet, Take 500 mg by mouth every 6 (six) hours as needed for moderate pain., Disp: , Rfl:    aspirin EC 81 MG tablet, Take 81 mg by mouth daily. Swallow whole., Disp: , Rfl:    atorvastatin (LIPITOR) 10 MG tablet, Take 1 tablet (10 mg total) by mouth daily., Disp: 90 tablet, Rfl: 1   Biotin 5000 MCG CAPS, Take 5,000 mcg by mouth daily. , Disp: , Rfl:    celecoxib (CELEBREX) 200 MG capsule, Take 1 capsule (200 mg total) by mouth 2 (two) times daily between meals as needed., Disp: 60 capsule, Rfl: 3   chlorthalidone (HYGROTON) 25 MG tablet, TAKE 1 TABLET (25 MG TOTAL) BY MOUTH DAILY., Disp: 90  tablet, Rfl: 1   diclofenac Sodium (VOLTAREN) 1 % GEL, Apply 2 g topically 4 (four) times daily., Disp: 350 g, Rfl: 1   fluticasone (FLONASE) 50 MCG/ACT nasal spray, Place 2 sprays into both nostrils daily., Disp: 16 g, Rfl: 6   losartan (COZAAR) 25 MG tablet, Take 25 mg by mouth daily., Disp: , Rfl:    Potassium Chloride ER 20 MEQ TBCR, Take 1 tablet by mouth daily., Disp: , Rfl:    PROAIR HFA 108 (90 Base) MCG/ACT inhaler, Inhale 1-2 puffs into the lungs every 4 (four) hours as needed for wheezing or shortness of breath. , Disp: , Rfl:    SYMBICORT 80-4.5 MCG/ACT inhaler, Inhale 2 puffs into the lungs 2 (two) times daily., Disp: , Rfl:    triamcinolone cream (KENALOG) 0.1 %, Apply topically 2 (two) times daily., Disp: , Rfl:    metFORMIN (GLUCOPHAGE-XR) 500 MG 24 hr tablet, Take 1 tablet (500 mg total) by mouth 2 (two) times daily with a meal., Disp: 180 tablet, Rfl: 1   Vitamin D, Ergocalciferol, (DRISDOL) 1.25 MG (50000 UNIT) CAPS capsule, TAKE 1 CAPSULE BY MOUTH ONE TIME PER WEEK, Disp: 12 capsule, Rfl: 2   vitamin E 1000 UNIT capsule, Take 1,000 Units by mouth daily. (Patient not taking: Reported on 10/22/2022), Disp: , Rfl:    Objective:     BP 132/70 (BP Location: Right Arm, Patient  Position: Sitting, Cuff Size: Large)   Pulse 62   Temp 97.9 F (36.6 C) (Temporal)   Ht 5\' 5"  (1.651 m)   Wt 271 lb 3.2 oz (123 kg)   SpO2 94%   BMI 45.13 kg/m  BP Readings from Last 3 Encounters:  10/22/22 132/70  10/15/22 118/70  10/09/22 (!) 141/76   Wt Readings from Last 3 Encounters:  10/22/22 271 lb 3.2 oz (123 kg)  10/09/22 270 lb (122.5 kg)  10/07/22 270 lb 1 oz (122.5 kg)      Physical Exam Constitutional:      General: She is not in acute distress.    Appearance: Normal appearance. She is not ill-appearing, toxic-appearing or diaphoretic.  HENT:     Head: Normocephalic and atraumatic.     Right Ear: External ear normal.     Left Ear: External ear normal.  Eyes:     General: No  scleral icterus.       Right eye: No discharge.        Left eye: No discharge.     Extraocular Movements: Extraocular movements intact.     Conjunctiva/sclera: Conjunctivae normal.  Pulmonary:     Effort: Pulmonary effort is normal. No respiratory distress.  Skin:    General: Skin is warm and dry.  Neurological:     Mental Status: She is alert and oriented to person, place, and time.  Psychiatric:        Mood and Affect: Mood normal.        Behavior: Behavior normal.      Results for orders placed or performed in visit on 10/22/22  Hemoglobin A1c  Result Value Ref Range   Hgb A1c MFr Bld 7.9 (H) 4.6 - 6.5 %      The 10-year ASCVD risk score (Arnett DK, et al., 2019) is: 23%    Assessment & Plan:   Vitamin D deficiency -     VITAMIN D 25 Hydroxy (Vit-D Deficiency, Fractures)  Type 2 diabetes mellitus with complication, without long-term current use of insulin (HCC) -     Hemoglobin A1c -     metFORMIN HCl ER; Take 1 tablet (500 mg total) by mouth 2 (two) times daily with a meal.  Dispense: 180 tablet; Refill: 1  Essential hypertension  Vitamin D deficiency, unspecified -     Vitamin D (Ergocalciferol); TAKE 1 CAPSULE BY MOUTH ONE TIME PER WEEK  Dispense: 12 capsule; Refill: 2    Return in about 3 months (around 01/22/2023).  Patient will go ahead and start vitamin D.  May very well increase metformin to 500 twice daily.  Patient will start by MD weekly tablet.  3 months.  Encouraged her to increase the protein in her diet.  [name], MD

## 2022-10-23 DIAGNOSIS — M25551 Pain in right hip: Secondary | ICD-10-CM | POA: Diagnosis not present

## 2022-10-23 DIAGNOSIS — M25561 Pain in right knee: Secondary | ICD-10-CM | POA: Diagnosis not present

## 2022-10-23 DIAGNOSIS — R262 Difficulty in walking, not elsewhere classified: Secondary | ICD-10-CM | POA: Diagnosis not present

## 2022-10-23 DIAGNOSIS — M79601 Pain in right arm: Secondary | ICD-10-CM | POA: Diagnosis not present

## 2022-10-26 DIAGNOSIS — M79601 Pain in right arm: Secondary | ICD-10-CM | POA: Diagnosis not present

## 2022-10-26 DIAGNOSIS — R262 Difficulty in walking, not elsewhere classified: Secondary | ICD-10-CM | POA: Diagnosis not present

## 2022-10-26 DIAGNOSIS — R531 Weakness: Secondary | ICD-10-CM | POA: Diagnosis not present

## 2022-10-26 DIAGNOSIS — M25561 Pain in right knee: Secondary | ICD-10-CM | POA: Diagnosis not present

## 2022-10-27 ENCOUNTER — Institutional Professional Consult (permissible substitution): Payer: Medicare PPO | Admitting: Neurology

## 2022-10-27 DIAGNOSIS — M79601 Pain in right arm: Secondary | ICD-10-CM | POA: Diagnosis not present

## 2022-10-28 ENCOUNTER — Institutional Professional Consult (permissible substitution): Payer: Medicare PPO | Admitting: Neurology

## 2022-10-28 DIAGNOSIS — Z1272 Encounter for screening for malignant neoplasm of vagina: Secondary | ICD-10-CM | POA: Diagnosis not present

## 2022-10-28 DIAGNOSIS — N760 Acute vaginitis: Secondary | ICD-10-CM | POA: Diagnosis not present

## 2022-10-28 DIAGNOSIS — Z1151 Encounter for screening for human papillomavirus (HPV): Secondary | ICD-10-CM | POA: Diagnosis not present

## 2022-10-28 DIAGNOSIS — N952 Postmenopausal atrophic vaginitis: Secondary | ICD-10-CM | POA: Diagnosis not present

## 2022-10-28 DIAGNOSIS — Z124 Encounter for screening for malignant neoplasm of cervix: Secondary | ICD-10-CM | POA: Diagnosis not present

## 2022-10-28 DIAGNOSIS — Z6841 Body Mass Index (BMI) 40.0 and over, adult: Secondary | ICD-10-CM | POA: Diagnosis not present

## 2022-10-29 ENCOUNTER — Other Ambulatory Visit: Payer: Self-pay | Admitting: Obstetrics and Gynecology

## 2022-10-29 DIAGNOSIS — Z1231 Encounter for screening mammogram for malignant neoplasm of breast: Secondary | ICD-10-CM

## 2022-10-30 DIAGNOSIS — J301 Allergic rhinitis due to pollen: Secondary | ICD-10-CM | POA: Diagnosis not present

## 2022-10-30 DIAGNOSIS — R262 Difficulty in walking, not elsewhere classified: Secondary | ICD-10-CM | POA: Diagnosis not present

## 2022-10-30 DIAGNOSIS — M79601 Pain in right arm: Secondary | ICD-10-CM | POA: Diagnosis not present

## 2022-10-30 DIAGNOSIS — M25561 Pain in right knee: Secondary | ICD-10-CM | POA: Diagnosis not present

## 2022-10-30 DIAGNOSIS — R531 Weakness: Secondary | ICD-10-CM | POA: Diagnosis not present

## 2022-10-30 DIAGNOSIS — J3089 Other allergic rhinitis: Secondary | ICD-10-CM | POA: Diagnosis not present

## 2022-10-30 DIAGNOSIS — J4520 Mild intermittent asthma, uncomplicated: Secondary | ICD-10-CM | POA: Diagnosis not present

## 2022-10-30 DIAGNOSIS — J3081 Allergic rhinitis due to animal (cat) (dog) hair and dander: Secondary | ICD-10-CM | POA: Diagnosis not present

## 2022-11-03 ENCOUNTER — Encounter: Payer: Medicare PPO | Admitting: Physical Medicine and Rehabilitation

## 2022-11-03 ENCOUNTER — Telehealth: Payer: Self-pay | Admitting: Physical Medicine and Rehabilitation

## 2022-11-03 DIAGNOSIS — M1611 Unilateral primary osteoarthritis, right hip: Secondary | ICD-10-CM | POA: Diagnosis not present

## 2022-11-03 NOTE — Telephone Encounter (Signed)
Pt call in at 3 pm stating she did not know her appt with dr Alvester Morin was this morning and asking for a call back to reschedule as soon as possible. Please call pt at [phone].

## 2022-11-03 NOTE — Telephone Encounter (Signed)
LVM to return call to reschedule NCV 

## 2022-11-04 DIAGNOSIS — R531 Weakness: Secondary | ICD-10-CM | POA: Diagnosis not present

## 2022-11-04 DIAGNOSIS — M79601 Pain in right arm: Secondary | ICD-10-CM | POA: Diagnosis not present

## 2022-11-04 DIAGNOSIS — M25561 Pain in right knee: Secondary | ICD-10-CM | POA: Diagnosis not present

## 2022-11-04 DIAGNOSIS — R262 Difficulty in walking, not elsewhere classified: Secondary | ICD-10-CM | POA: Diagnosis not present

## 2022-11-09 ENCOUNTER — Other Ambulatory Visit: Payer: Self-pay | Admitting: Obstetrics and Gynecology

## 2022-11-09 ENCOUNTER — Telehealth: Payer: Self-pay | Admitting: Orthopedic Surgery

## 2022-11-09 DIAGNOSIS — C50911 Malignant neoplasm of unspecified site of right female breast: Secondary | ICD-10-CM

## 2022-11-09 NOTE — Telephone Encounter (Signed)
Bianca Medina states that there must have been a mix up with her appointments.  She missed her NCS that was scheduled for 11/03/22. She would like to have this rescheduled please. Call back # is [phone].

## 2022-11-11 DIAGNOSIS — M25561 Pain in right knee: Secondary | ICD-10-CM | POA: Diagnosis not present

## 2022-11-11 DIAGNOSIS — R531 Weakness: Secondary | ICD-10-CM | POA: Diagnosis not present

## 2022-11-11 DIAGNOSIS — R262 Difficulty in walking, not elsewhere classified: Secondary | ICD-10-CM | POA: Diagnosis not present

## 2022-11-11 DIAGNOSIS — M79601 Pain in right arm: Secondary | ICD-10-CM | POA: Diagnosis not present

## 2022-11-12 ENCOUNTER — Telehealth: Payer: Self-pay | Admitting: Physical Medicine and Rehabilitation

## 2022-11-12 NOTE — Telephone Encounter (Signed)
LVM to return call to reschedule NCV 

## 2022-11-12 NOTE — Telephone Encounter (Signed)
Patient returned called asked for a call back to schedule an appointment with Dr. Alvester Morin. The number to contact patient is [phone]

## 2022-11-13 NOTE — Telephone Encounter (Signed)
Spoke with patient and rescheduled NCV for 11/25/22.

## 2022-11-16 ENCOUNTER — Telehealth: Payer: Self-pay

## 2022-11-16 NOTE — Telephone Encounter (Signed)
lmtrc regarding Rx that was dropped off by her at our office. Please have pt clarify what she is needing.

## 2022-11-18 DIAGNOSIS — R531 Weakness: Secondary | ICD-10-CM | POA: Diagnosis not present

## 2022-11-18 DIAGNOSIS — M79601 Pain in right arm: Secondary | ICD-10-CM | POA: Diagnosis not present

## 2022-11-18 DIAGNOSIS — R262 Difficulty in walking, not elsewhere classified: Secondary | ICD-10-CM | POA: Diagnosis not present

## 2022-11-18 DIAGNOSIS — M25561 Pain in right knee: Secondary | ICD-10-CM | POA: Diagnosis not present

## 2022-11-23 DIAGNOSIS — K921 Melena: Secondary | ICD-10-CM | POA: Diagnosis not present

## 2022-11-23 DIAGNOSIS — D124 Benign neoplasm of descending colon: Secondary | ICD-10-CM | POA: Diagnosis not present

## 2022-11-23 DIAGNOSIS — K5900 Constipation, unspecified: Secondary | ICD-10-CM | POA: Diagnosis not present

## 2022-11-23 DIAGNOSIS — K648 Other hemorrhoids: Secondary | ICD-10-CM | POA: Diagnosis not present

## 2022-11-25 ENCOUNTER — Ambulatory Visit: Payer: Medicare PPO | Admitting: Physical Medicine and Rehabilitation

## 2022-11-25 DIAGNOSIS — D124 Benign neoplasm of descending colon: Secondary | ICD-10-CM | POA: Diagnosis not present

## 2022-11-25 DIAGNOSIS — M79631 Pain in right forearm: Secondary | ICD-10-CM | POA: Diagnosis not present

## 2022-11-25 DIAGNOSIS — M79601 Pain in right arm: Secondary | ICD-10-CM | POA: Diagnosis not present

## 2022-11-25 DIAGNOSIS — R202 Paresthesia of skin: Secondary | ICD-10-CM

## 2022-11-25 DIAGNOSIS — R29898 Other symptoms and signs involving the musculoskeletal system: Secondary | ICD-10-CM | POA: Diagnosis not present

## 2022-11-25 NOTE — Progress Notes (Signed)
Functional Pain Scale - descriptive words and definitions  Uncomfortable (3)  Pain is present but can complete all ADL's/sleep is slightly affected and passive distraction only gives marginal relief. Mild range order  Average Pain  varies  Right handed. Right arm weakness and pain up into the bicep. Hard to grasp and twist things

## 2022-11-30 NOTE — Progress Notes (Unsigned)
Bianca Medina - 72 y.o. female MRN [id number]  Date of birth: 04-Oct-1950  Office Visit Note: Visit Date: 11/25/2022 PCP: [name], MD Referred by: Persons, [place], [place]  Subjective: Chief Complaint  Patient presents with   Right Arm - Pain, Weakness   HPI: Bianca Medina is a 72 y.o. female who comes in todayHPI  {Oswestry Disability Score:26558}  ROS Otherwise per HPI.  Assessment & Plan: Visit Diagnoses:    ICD-10-CM   1. Paresthesia of skin  R20.2 NCV with EMG (electromyography)       Plan: No additional findings.   Meds & Orders: No orders of the defined types were placed in this encounter.   Orders Placed This Encounter  Procedures   NCV with EMG (electromyography)    Follow-up: No follow-ups on file.   Procedures: No procedures performed  EMG & NCV Findings: Evaluation of the right median (across palm) sensory nerve showed prolonged distal peak latency (Wrist, 4.0 ms) and prolonged distal peak latency (Palm, 2.4 ms).  All remaining nerves (as indicated in the following tables) were within normal limits.    All examined muscles (as indicated in the following table) showed no evidence of electrical instability.    Impression: The above electrodiagnostic study is ABNORMAL and reveals evidence of a mild right median nerve entrapment at the wrist (carpal tunnel syndrome) affecting sensory components.   There is no significant electrodiagnostic evidence of any other focal nerve entrapment, brachial plexopathy or cervical radiculopathy.  As you know, this particular electrodiagnostic study cannot rule out chemical radiculitis or sensory only radiculopathy.   **This electrodiagnostic study cannot rule out small fiber polyneuropathy and dysesthesias from central pain syndromes such as stroke or central pain sensitization syndromes such as fibromyalgia.  Myotomal referral pain from trigger points is also not excluded.  Recommendations: 1.  Follow-up with  referring physician. 2.  Continue current management of symptoms. 3.  Continue use of resting splint at night-time and as needed during the day.  ___________________________ Elease Hashimoto Board Certified, American Board of Physical Medicine and Rehabilitation    Nerve Conduction Studies Anti Sensory Summary Table   Stim Site NR Peak (ms) Norm Peak (ms) P-T Amp (V) Norm P-T Amp Site1 Site2 Delta-P (ms) Dist (cm) Vel (m/s) Norm Vel (m/s)  Right Median Acr Palm Anti Sensory (2nd Digit)  31.3C  Wrist    *4.0 <3.6 17.8 >10 Wrist Palm 1.6 0.0    Palm    *2.4 <2.0 2.6         Right Radial Anti Sensory (Base 1st Digit)  30.8C  Wrist    2.3 <3.1 11.9  Wrist Base 1st Digit 2.3 0.0    Right Ulnar Anti Sensory (5th Digit)  31.8C  Wrist    3.1 <3.7 18.3 >15.0 Wrist 5th Digit 3.1 14.0 45 >38   Motor Summary Table   Stim Site NR Onset (ms) Norm Onset (ms) O-P Amp (mV) Norm O-P Amp Site1 Site2 Delta-0 (ms) Dist (cm) Vel (m/s) Norm Vel (m/s)  Right Median Motor (Abd Poll Brev)  31.6C  Wrist    3.5 <4.2 8.9 >5 Elbow Wrist 4.1 22.0 54 >50  Elbow    7.6  8.5         Right Ulnar Motor (Abd Dig Min)  32C  Wrist    2.8 <4.2 7.7 >3 B Elbow Wrist 3.1 19.0 61 >53  B Elbow    5.9  7.4  A Elbow B Elbow 1.2  10.5 88 >53  A Elbow    7.1  7.4          EMG   Side Muscle Nerve Root Ins Act Fibs Psw Amp Dur Poly Recrt Int Dennie Bible Comment  Right Abd Poll Brev Median C8-T1 Nml Nml Nml Nml Nml 0 Nml Nml   Right 1stDorInt Ulnar C8-T1 Nml Nml Nml Nml Nml 0 Nml Nml   Right PronatorTeres Median C6-7 Nml Nml Nml Nml Nml 0 Nml Nml   Right Biceps Musculocut C5-6 Nml Nml Nml Nml Nml 0 Nml Nml   Right Deltoid Axillary C5-6 Nml Nml Nml Nml Nml 0 Nml Nml     Nerve Conduction Studies Anti Sensory Left/Right Comparison   Stim Site L Lat (ms) R Lat (ms) L-R Lat (ms) L Amp (V) R Amp (V) L-R Amp (%) Site1 Site2 L Vel (m/s) R Vel (m/s) L-R Vel (m/s)  Median Acr Palm Anti Sensory (2nd Digit)  31.3C  Wrist  *4.0    17.8  Wrist Palm     Palm  *2.4   2.6        Radial Anti Sensory (Base 1st Digit)  30.8C  Wrist  2.3   11.9  Wrist Base 1st Digit     Ulnar Anti Sensory (5th Digit)  31.8C  Wrist  3.1   18.3  Wrist 5th Digit  45    Motor Left/Right Comparison   Stim Site L Lat (ms) R Lat (ms) L-R Lat (ms) L Amp (mV) R Amp (mV) L-R Amp (%) Site1 Site2 L Vel (m/s) R Vel (m/s) L-R Vel (m/s)  Median Motor (Abd Poll Brev)  31.6C  Wrist  3.5   8.9  Elbow Wrist  54   Elbow  7.6   8.5        Ulnar Motor (Abd Dig Min)  32C  Wrist  2.8   7.7  B Elbow Wrist  61   B Elbow  5.9   7.4  A Elbow B Elbow  88   A Elbow  7.1   7.4           Waveforms:             Clinical History: No specialty comments available.   She reports that she has never smoked. She has never used smokeless tobacco.  Recent Labs    04/23/22 1125 10/22/22 1134  HGBA1C 7.5* 7.9*    Objective:  VS:  HT:    WT:   BMI:     BP:   HR: bpm  TEMP: ( )  RESP:  Physical Exam  Ortho Exam  Imaging: No results found.  Past Medical/Family/Surgical/Social History: Medications & Allergies reviewed per EMR, new medications updated. Patient Active Problem List   Diagnosis Date Noted   Venous stasis of both lower extremities 09/04/2022   Arthritis of right knee 09/04/2022   Follow-up medical care requested by patient 09/04/2022   Concerned about care plan 09/04/2022   Vitamin E deficiency 07/22/2022   Leg cramps 07/22/2022   Facial droop 06/16/2022   Pain of left upper extremity 06/16/2022   Right knee pain 06/11/2022   Mood disorder (HCC) 06/11/2022   Diverticulitis 04/26/2022   Cellulitis 04/26/2022   Leukocytosis 04/26/2022   Invasive ductal carcinoma of breast (HCC) 04/26/2022   Type 2 diabetes mellitus with complication, without long-term current use of insulin (HCC) 04/26/2022   Slow transit constipation 01/26/2022   Left leg swelling 11/10/2021   Hyperlipidemia 11/10/2021   Bruising  06/11/2021   Other  schizoaffective disorders (HCC) 04/30/2021   Thyroid nodule 04/30/2021   Screening for colon cancer 04/01/2021   Insect bite of right wrist 04/01/2021   Bite, insect 04/01/2021   Rash 03/28/2021   Iron deficiency 03/19/2021   Need for influenza vaccination 03/19/2021   Snoring 01/27/2021   Sleep related bruxism 01/27/2021   Gasping for breath 01/27/2021   Insomnia due to anxiety and fear 01/27/2021   Class 3 severe obesity due to excess calories with serious comorbidity and body mass index (BMI) of 40.0 to 44.9 in adult [facility]) 01/27/2021   Medicare annual wellness visit, subsequent 01/24/2021   Dehydration 12/26/2020   History of syncope 12/26/2020   Hospital discharge follow-up 12/26/2020   Healthcare maintenance 12/01/2020   Vitamin D deficiency 12/01/2020   Ear itching 06/27/2019   Common migraine 06/23/2019   History of anemia 03/31/2019   Pre-diabetes 03/31/2019   Viral syndrome 03/31/2019   Sensorineural hearing loss (SNHL), bilateral 08/12/2017   Tinnitus of both ears 08/12/2017   Morbid obesity (HCC) 12/28/2016   Left breast mass 07/19/2014   Breast tenderness in female 07/19/2014   Menopause 01/09/2014   Vaginal atrophy 01/09/2014   Skin lesion 12/23/2012   Essential hypertension 10/31/2012   Heartburn 10/31/2012   Past Medical History:  Diagnosis Date   Adenomatous colon polyp 2021   Anemia 2003   IRON DEFICIENCY    Asthma    Breast cancer (HCC)    right breast   Chronic anal fissure    Common migraine 06/23/2019   Diabetes mellitus without complication (HCC)    GERD (gastroesophageal reflux disease)    Hearing loss    Hyperlipidemia, mild    Hypertension    IDA (iron deficiency anemia)    NSVD (normal spontaneous vaginal delivery)    X3   Obesity    PMDD (premenstrual dysphoric disorder)    Sensorineural hearing loss (SNHL) of both ears    Sleep disorder    Family History  Problem Relation Age of Onset   Cancer Mother        LUNG- SMOKER    Diabetes Father    Hypertension Father    Heart disease Father    Stroke Father    Breast cancer Sister        lates 72s   Cancer Paternal Aunt        COLON   Cancer Paternal Uncle        COLON   Past Surgical History:  Procedure Laterality Date   ABDOMINAL HYSTERECTOMY     BLADDER REPAIR     BREAST BIOPSY Right    2023   COLONOSCOPY  11/2019   1 hpp and 1 tubl adenoma   COLONOSCOPY WITH PROPOFOL N/A 12/03/2021   Procedure: COLONOSCOPY WITH PROPOFOL;  Surgeon: Earline Mayotte, MD;  Location: [facility] ENDOSCOPY;  Service: Endoscopy;  Laterality: N/A;   GUM SURGERY  1980's   MANDIBLE FRACTURE SURGERY     TUBAL LIGATION     Social History   Occupational History   Occupation: Interior and spatial designer: [employer]  Tobacco Use   Smoking status: Never   Smokeless tobacco: Never  Vaping Use   Vaping status: Never Used  Substance and Sexual Activity   Alcohol use: Not Currently   Drug use: Never   Sexual activity: Not Currently    Partners: Male    Birth control/protection: Surgical, Abstinence    Comment: HYSTERECTOMY-1st intercourse 17  yo-5 partners

## 2022-11-30 NOTE — Procedures (Unsigned)
EMG & NCV Findings: Evaluation of the right median (across palm) sensory nerve showed prolonged distal peak latency (Wrist, 4.0 ms) and prolonged distal peak latency (Palm, 2.4 ms).  All remaining nerves (as indicated in the following tables) were within normal limits.    All examined muscles (as indicated in the following table) showed no evidence of electrical instability.    Impression: The above electrodiagnostic study is ABNORMAL and reveals evidence of a mild right median nerve entrapment at the wrist (carpal tunnel syndrome) affecting sensory components.   There is no significant electrodiagnostic evidence of any other focal nerve entrapment, brachial plexopathy or cervical radiculopathy.  As you know, this particular electrodiagnostic study cannot rule out chemical radiculitis or sensory only radiculopathy.   **This electrodiagnostic study cannot rule out small fiber polyneuropathy and dysesthesias from central pain syndromes such as stroke or central pain sensitization syndromes such as fibromyalgia.  Myotomal referral pain from trigger points is also not excluded.  Recommendations: 1.  Follow-up with referring physician. 2.  Continue current management of symptoms. 3.  Continue use of resting splint at night-time and as needed during the day.  ___________________________ Bianca Medina Board Certified, American Board of Physical Medicine and Rehabilitation    Nerve Conduction Studies Anti Sensory Summary Table   Stim Site NR Peak (ms) Norm Peak (ms) P-T Amp (V) Norm P-T Amp Site1 Site2 Delta-P (ms) Dist (cm) Vel (m/s) Norm Vel (m/s)  Right Median Acr Palm Anti Sensory (2nd Digit)  31.3C  Wrist    *4.0 <3.6 17.8 >10 Wrist Palm 1.6 0.0    Palm    *2.4 <2.0 2.6         Right Radial Anti Sensory (Base 1st Digit)  30.8C  Wrist    2.3 <3.1 11.9  Wrist Base 1st Digit 2.3 0.0    Right Ulnar Anti Sensory (5th Digit)  31.8C  Wrist    3.1 <3.7 18.3 >15.0 Wrist 5th Digit 3.1  14.0 45 >38   Motor Summary Table   Stim Site NR Onset (ms) Norm Onset (ms) O-P Amp (mV) Norm O-P Amp Site1 Site2 Delta-0 (ms) Dist (cm) Vel (m/s) Norm Vel (m/s)  Right Median Motor (Abd Poll Brev)  31.6C  Wrist    3.5 <4.2 8.9 >5 Elbow Wrist 4.1 22.0 54 >50  Elbow    7.6  8.5         Right Ulnar Motor (Abd Dig Min)  32C  Wrist    2.8 <4.2 7.7 >3 B Elbow Wrist 3.1 19.0 61 >53  B Elbow    5.9  7.4  A Elbow B Elbow 1.2 10.5 88 >53  A Elbow    7.1  7.4          EMG   Side Muscle Nerve Root Ins Act Fibs Psw Amp Dur Poly Recrt Int Dennie Bible Comment  Right Abd Poll Brev Median C8-T1 Nml Nml Nml Nml Nml 0 Nml Nml   Right 1stDorInt Ulnar C8-T1 Nml Nml Nml Nml Nml 0 Nml Nml   Right PronatorTeres Median C6-7 Nml Nml Nml Nml Nml 0 Nml Nml   Right Biceps Musculocut C5-6 Nml Nml Nml Nml Nml 0 Nml Nml   Right Deltoid Axillary C5-6 Nml Nml Nml Nml Nml 0 Nml Nml     Nerve Conduction Studies Anti Sensory Left/Right Comparison   Stim Site L Lat (ms) R Lat (ms) L-R Lat (ms) L Amp (V) R Amp (V) L-R Amp (%) Site1 Site2 L Vel (m/s) R  Vel (m/s) L-R Vel (m/s)  Median Acr Palm Anti Sensory (2nd Digit)  31.3C  Wrist  *4.0   17.8  Wrist Palm     Palm  *2.4   2.6        Radial Anti Sensory (Base 1st Digit)  30.8C  Wrist  2.3   11.9  Wrist Base 1st Digit     Ulnar Anti Sensory (5th Digit)  31.8C  Wrist  3.1   18.3  Wrist 5th Digit  45    Motor Left/Right Comparison   Stim Site L Lat (ms) R Lat (ms) L-R Lat (ms) L Amp (mV) R Amp (mV) L-R Amp (%) Site1 Site2 L Vel (m/s) R Vel (m/s) L-R Vel (m/s)  Median Motor (Abd Poll Brev)  31.6C  Wrist  3.5   8.9  Elbow Wrist  54   Elbow  7.6   8.5        Ulnar Motor (Abd Dig Min)  32C  Wrist  2.8   7.7  B Elbow Wrist  61   B Elbow  5.9   7.4  A Elbow B Elbow  88   A Elbow  7.1   7.4           Waveforms:

## 2022-12-01 ENCOUNTER — Ambulatory Visit: Payer: Medicare PPO

## 2022-12-01 ENCOUNTER — Encounter: Payer: Self-pay | Admitting: Physical Medicine and Rehabilitation

## 2022-12-03 ENCOUNTER — Other Ambulatory Visit: Payer: Self-pay | Admitting: Obstetrics and Gynecology

## 2022-12-03 DIAGNOSIS — C50911 Malignant neoplasm of unspecified site of right female breast: Secondary | ICD-10-CM

## 2022-12-03 DIAGNOSIS — Z780 Asymptomatic menopausal state: Secondary | ICD-10-CM

## 2022-12-07 DIAGNOSIS — M79601 Pain in right arm: Secondary | ICD-10-CM | POA: Diagnosis not present

## 2022-12-07 DIAGNOSIS — M25561 Pain in right knee: Secondary | ICD-10-CM | POA: Diagnosis not present

## 2022-12-07 DIAGNOSIS — R262 Difficulty in walking, not elsewhere classified: Secondary | ICD-10-CM | POA: Diagnosis not present

## 2022-12-07 DIAGNOSIS — R531 Weakness: Secondary | ICD-10-CM | POA: Diagnosis not present

## 2022-12-09 ENCOUNTER — Ambulatory Visit
Admission: RE | Admit: 2022-12-09 | Discharge: 2022-12-09 | Disposition: A | Discharge status: Home / Self Care | Payer: Medicare PPO | Source: Ambulatory Visit | Attending: Surgery | Admitting: Surgery

## 2022-12-09 ENCOUNTER — Other Ambulatory Visit: Payer: Self-pay | Admitting: Surgery

## 2022-12-09 ENCOUNTER — Ambulatory Visit
Admission: RE | Admit: 2022-12-09 | Discharge: 2022-12-09 | Disposition: A | Discharge status: Home / Self Care | Payer: Medicare PPO | Source: Ambulatory Visit | Attending: Obstetrics and Gynecology | Admitting: Obstetrics and Gynecology

## 2022-12-09 DIAGNOSIS — Z853 Personal history of malignant neoplasm of breast: Secondary | ICD-10-CM

## 2022-12-09 DIAGNOSIS — M79601 Pain in right arm: Secondary | ICD-10-CM | POA: Diagnosis not present

## 2022-12-09 DIAGNOSIS — M25561 Pain in right knee: Secondary | ICD-10-CM | POA: Diagnosis not present

## 2022-12-09 DIAGNOSIS — C50911 Malignant neoplasm of unspecified site of right female breast: Secondary | ICD-10-CM

## 2022-12-09 DIAGNOSIS — R531 Weakness: Secondary | ICD-10-CM | POA: Diagnosis not present

## 2022-12-09 DIAGNOSIS — C50411 Malignant neoplasm of upper-outer quadrant of right female breast: Secondary | ICD-10-CM | POA: Diagnosis not present

## 2022-12-09 DIAGNOSIS — R262 Difficulty in walking, not elsewhere classified: Secondary | ICD-10-CM | POA: Diagnosis not present

## 2022-12-16 ENCOUNTER — Ambulatory Visit: Payer: Medicare PPO | Admitting: Orthopaedic Surgery

## 2022-12-16 ENCOUNTER — Telehealth: Payer: Self-pay | Admitting: Orthopaedic Surgery

## 2022-12-16 DIAGNOSIS — M79601 Pain in right arm: Secondary | ICD-10-CM | POA: Diagnosis not present

## 2022-12-16 DIAGNOSIS — R531 Weakness: Secondary | ICD-10-CM | POA: Diagnosis not present

## 2022-12-16 DIAGNOSIS — M25561 Pain in right knee: Secondary | ICD-10-CM | POA: Diagnosis not present

## 2022-12-16 DIAGNOSIS — R262 Difficulty in walking, not elsewhere classified: Secondary | ICD-10-CM | POA: Diagnosis not present

## 2022-12-16 NOTE — Telephone Encounter (Signed)
Pt came in today stating she missed her appt today because she was not feeling well but she had a stroke in 2022 and one around 06/2022 and she is concerned about the pain in Bil Arms, legs and ankles and she is very concerned, I advised pt next avail was 8/21 and I also offered [facility] she only wants to see Magnus Ivan and wanted me to put in a request to get an earlier appt, I will schedule her an apt at the next avail and put her on the wait list.

## 2022-12-18 ENCOUNTER — Other Ambulatory Visit: Payer: Self-pay

## 2022-12-18 ENCOUNTER — Emergency Department (HOSPITAL_COMMUNITY): Payer: Medicare PPO

## 2022-12-18 ENCOUNTER — Encounter (HOSPITAL_COMMUNITY): Payer: Self-pay | Admitting: Emergency Medicine

## 2022-12-18 ENCOUNTER — Emergency Department (HOSPITAL_COMMUNITY)
Admission: EM | Admit: 2022-12-18 | Discharge: 2022-12-18 | Discharge status: Against Medical Advice | Payer: Medicare PPO | Attending: Emergency Medicine | Admitting: Emergency Medicine

## 2022-12-18 DIAGNOSIS — R110 Nausea: Secondary | ICD-10-CM | POA: Diagnosis not present

## 2022-12-18 DIAGNOSIS — Z5321 Procedure and treatment not carried out due to patient leaving prior to being seen by health care provider: Secondary | ICD-10-CM | POA: Diagnosis not present

## 2022-12-18 DIAGNOSIS — R519 Headache, unspecified: Secondary | ICD-10-CM | POA: Diagnosis not present

## 2022-12-18 LAB — CBC WITH DIFFERENTIAL/PLATELET
Abs Immature Granulocytes: 0.03 10*3/uL (ref 0.00–0.07)
Basophils Absolute: 0.1 10*3/uL (ref 0.0–0.1)
Basophils Relative: 1 %
Eosinophils Absolute: 0.3 10*3/uL (ref 0.0–0.5)
Eosinophils Relative: 3 %
HCT: 37.4 % (ref 36.0–46.0)
Hemoglobin: 12.1 g/dL (ref 12.0–15.0)
Immature Granulocytes: 0 %
Lymphocytes Relative: 23 %
Lymphs Abs: 2.4 10*3/uL (ref 0.7–4.0)
MCH: 24.2 pg — ABNORMAL LOW (ref 26.0–34.0)
MCHC: 32.4 g/dL (ref 30.0–36.0)
MCV: 74.8 fL — ABNORMAL LOW (ref 80.0–100.0)
Monocytes Absolute: 0.7 10*3/uL (ref 0.1–1.0)
Monocytes Relative: 7 %
Neutro Abs: 6.8 10*3/uL (ref 1.7–7.7)
Neutrophils Relative %: 66 %
Platelets: 249 10*3/uL (ref 150–400)
RBC: 5 MIL/uL (ref 3.87–5.11)
RDW: 14.8 % (ref 11.5–15.5)
WBC: 10.2 10*3/uL (ref 4.0–10.5)
nRBC: 0 % (ref 0.0–0.2)

## 2022-12-18 LAB — BASIC METABOLIC PANEL
Anion gap: 10 (ref 5–15)
BUN: 14 mg/dL (ref 8–23)
CO2: 27 mmol/L (ref 22–32)
Calcium: 8.7 mg/dL — ABNORMAL LOW (ref 8.9–10.3)
Chloride: 100 mmol/L (ref 98–111)
Creatinine, Ser: 0.82 mg/dL (ref 0.44–1.00)
GFR, Estimated: >60 mL/min (ref >60)
Glucose, Bld: 165 mg/dL — ABNORMAL HIGH (ref 70–99)
Potassium: 3.7 mmol/L (ref 3.5–5.1)
Sodium: 137 mmol/L (ref 135–145)

## 2022-12-18 NOTE — ED Notes (Signed)
Pt is not responding in lobby.

## 2022-12-18 NOTE — ED Provider Triage Note (Signed)
Emergency Medicine Provider Triage Evaluation Note  Bianca Medina , a 72 y.o. female  was evaluated in triage.  Pt complains of right posterior headache that began yesterday.  Patient denies this headache being sudden or maximal onset or thunderclap and states that it comes and goes.  Patient does have history of migraines but states this feels different.  Patient does endorse mild vision changes that have since resolved.  Patient denies any weakness, neck pain, recent trauma, fevers, neck stiffness.  Review of Systems  Positive: See HPI Negative: See HPI  Physical Exam  BP (!) 136/52 (BP Location: Right Arm)   Pulse 62   Temp 98.5 F (36.9 C) (Oral)   Resp 16   SpO2 97%  Gen:   Awake, no distress   Resp:  Normal effort  MSK:   Moves extremities without difficulty  Other:  Pupils PERRL bilaterally with EOM intact, 5 out of 5 bilateral grip strength with full sensation, equal symmetrical smile, equal symmetrical shoulder raise  Medical Decision Making  Medically screening exam initiated at 4:17 PM.  Appropriate orders placed.  Bianca Medina was informed that the remainder of the evaluation will be completed by another provider, this initial triage assessment does not replace that evaluation, and the importance of remaining in the ED until their evaluation is complete.  Workup initiated, patient stable at this time   Remi Deter 12/18/22 1648

## 2022-12-18 NOTE — ED Triage Notes (Signed)
Pt with headache on the back of her head on the left.  Pt states it is severe, intermittent.  Pt reports no sensitivity to light or nausea.  Pt states "it is not like a regular headache"

## 2022-12-18 NOTE — Progress Notes (Signed)
Attempted to find patient for CT. Hallway 12 bed is clean and made up. Cannot locate patient for CT scan at this time

## 2022-12-18 NOTE — ED Provider Notes (Signed)
Patient left without being seen.   Rondel Baton, MD 12/19/22 [phone]

## 2022-12-19 ENCOUNTER — Emergency Department (HOSPITAL_COMMUNITY)
Admission: EM | Admit: 2022-12-19 | Discharge: 2022-12-19 | Disposition: A | Discharge status: Home / Self Care | Payer: Medicare PPO

## 2022-12-19 ENCOUNTER — Encounter (HOSPITAL_COMMUNITY): Payer: Self-pay | Admitting: *Deleted

## 2022-12-19 ENCOUNTER — Emergency Department (HOSPITAL_COMMUNITY): Payer: Medicare PPO

## 2022-12-19 ENCOUNTER — Other Ambulatory Visit: Payer: Self-pay

## 2022-12-19 DIAGNOSIS — Z7982 Long term (current) use of aspirin: Secondary | ICD-10-CM | POA: Diagnosis not present

## 2022-12-19 DIAGNOSIS — M5481 Occipital neuralgia: Secondary | ICD-10-CM | POA: Diagnosis not present

## 2022-12-19 DIAGNOSIS — Z79899 Other long term (current) drug therapy: Secondary | ICD-10-CM | POA: Insufficient documentation

## 2022-12-19 DIAGNOSIS — R930 Abnormal findings on diagnostic imaging of skull and head, not elsewhere classified: Secondary | ICD-10-CM | POA: Diagnosis not present

## 2022-12-19 DIAGNOSIS — R791 Abnormal coagulation profile: Secondary | ICD-10-CM | POA: Diagnosis not present

## 2022-12-19 DIAGNOSIS — Z853 Personal history of malignant neoplasm of breast: Secondary | ICD-10-CM | POA: Diagnosis not present

## 2022-12-19 DIAGNOSIS — E119 Type 2 diabetes mellitus without complications: Secondary | ICD-10-CM | POA: Insufficient documentation

## 2022-12-19 DIAGNOSIS — R519 Headache, unspecified: Secondary | ICD-10-CM | POA: Diagnosis not present

## 2022-12-19 DIAGNOSIS — I10 Essential (primary) hypertension: Secondary | ICD-10-CM | POA: Diagnosis not present

## 2022-12-19 DIAGNOSIS — Z7984 Long term (current) use of oral hypoglycemic drugs: Secondary | ICD-10-CM | POA: Diagnosis not present

## 2022-12-19 LAB — CBC
HCT: 38.2 % (ref 36.0–46.0)
Hemoglobin: 12.6 g/dL (ref 12.0–15.0)
MCH: 24.6 pg — ABNORMAL LOW (ref 26.0–34.0)
MCHC: 33 g/dL (ref 30.0–36.0)
MCV: 74.5 fL — ABNORMAL LOW (ref 80.0–100.0)
Platelets: 240 10*3/uL (ref 150–400)
RBC: 5.13 MIL/uL — ABNORMAL HIGH (ref 3.87–5.11)
RDW: 14.9 % (ref 11.5–15.5)
WBC: 9.9 10*3/uL (ref 4.0–10.5)
nRBC: 0 % (ref 0.0–0.2)

## 2022-12-19 LAB — COMPREHENSIVE METABOLIC PANEL
ALT: 22 U/L (ref 0–44)
AST: 17 U/L (ref 15–41)
Albumin: 3.2 g/dL — ABNORMAL LOW (ref 3.5–5.0)
Alkaline Phosphatase: 86 U/L (ref 38–126)
Anion gap: 10 (ref 5–15)
BUN: 13 mg/dL (ref 8–23)
CO2: 26 mmol/L (ref 22–32)
Calcium: 8.9 mg/dL (ref 8.9–10.3)
Chloride: 101 mmol/L (ref 98–111)
Creatinine, Ser: 0.8 mg/dL (ref 0.44–1.00)
GFR, Estimated: >60 mL/min (ref >60)
Glucose, Bld: 203 mg/dL — ABNORMAL HIGH (ref 70–99)
Potassium: 3.6 mmol/L (ref 3.5–5.1)
Sodium: 137 mmol/L (ref 135–145)
Total Bilirubin: 0.8 mg/dL (ref 0.3–1.2)
Total Protein: 6.7 g/dL (ref 6.5–8.1)

## 2022-12-19 LAB — ETHANOL: Alcohol, Ethyl (B): <10 mg/dL (ref <10)

## 2022-12-19 LAB — PROTIME-INR
INR: 1.1 (ref 0.8–1.2)
Prothrombin Time: 14.1 seconds (ref 11.4–15.2)

## 2022-12-19 LAB — DIFFERENTIAL
Abs Immature Granulocytes: 0.02 10*3/uL (ref 0.00–0.07)
Basophils Absolute: 0.1 10*3/uL (ref 0.0–0.1)
Basophils Relative: 1 %
Eosinophils Absolute: 0.2 10*3/uL (ref 0.0–0.5)
Eosinophils Relative: 2 %
Immature Granulocytes: 0 %
Lymphocytes Relative: 19 %
Lymphs Abs: 1.9 10*3/uL (ref 0.7–4.0)
Monocytes Absolute: 0.6 10*3/uL (ref 0.1–1.0)
Monocytes Relative: 6 %
Neutro Abs: 7.2 10*3/uL (ref 1.7–7.7)
Neutrophils Relative %: 72 %

## 2022-12-19 LAB — APTT: aPTT: 31 seconds (ref 24–36)

## 2022-12-19 MED ORDER — DEXAMETHASONE SODIUM PHOSPHATE 4 MG/ML IJ SOLN
4.0000 mg | Freq: Once | INTRAMUSCULAR | Status: AC
  Administered 2022-12-19: 4 mg via INTRAVENOUS
  Filled 2022-12-19: qty 1

## 2022-12-19 MED ORDER — ONDANSETRON HCL 4 MG/2ML IJ SOLN
4.0000 mg | Freq: Once | INTRAMUSCULAR | Status: AC
  Administered 2022-12-19: 4 mg via INTRAVENOUS
  Filled 2022-12-19: qty 2

## 2022-12-19 MED ORDER — SODIUM CHLORIDE 0.9 % IV BOLUS
1000.0000 mL | Freq: Once | INTRAVENOUS | Status: AC
  Administered 2022-12-19: 1000 mL via INTRAVENOUS

## 2022-12-19 MED ORDER — KETOROLAC TROMETHAMINE 15 MG/ML IJ SOLN
15.0000 mg | Freq: Once | INTRAMUSCULAR | Status: AC
  Administered 2022-12-19: 15 mg via INTRAVENOUS
  Filled 2022-12-19: qty 1

## 2022-12-19 NOTE — Discharge Instructions (Addendum)
Thank you for letting us take care of you today.   Your labs were reassuring. Your sugar was in the low 200s but otherwise we did not see any significant abnormalities to explain your headache. The scan of your head was normal for your age as well. Your ear did not appear infected. As we discussed, your symptoms sound somewhat consistent with a condition called occipital neuralgia which I provided information about. This is not a common condition but you do have some risk factors for it. If you continue to have similar symptoms, discuss this at your upcoming neurology visit to see if they want to do further testing or treatment of your headaches.   We gave you medications to treat your headache in the ED. We gave you a small dose of a steroid which may temporarily raise your blood sugar so please monitor this closely at home.   For new or worsening symptoms, return to nearest ED for re-evaluation.

## 2022-12-19 NOTE — ED Provider Notes (Signed)
[place] EMERGENCY DEPARTMENT AT [facility] Provider Note   CSN: [id number] Arrival date & time: 12/19/22  1115     History  Chief Complaint  Patient presents with   Otalgia    Bianca Medina is a 72 y.o. female with past medical history of breast cancer in remission, diabetes, GERD, hypertension, hyperlipidemia, iron deficiency anemia who presents to the ED complaining of intermittent sharp pains to the right occipital scalp and behind her ear.  States that this began 2 days ago and pain is severe rated an 8/10.  No positional component. No associated photophobia, nausea, vomiting, focal weakness, numbness/tingling, vision or hearing changes.  No history of the symptoms.  Does have a remote history of migraines but states that these were infrequent and did not feel similar.  No history of frequent headaches.  No fall or head injury. No recent cough, congestion, rhinorrhea. Notes some mild otorrhea that is not unusual for her however. She does have some relief of headache with Tylenol 1000mg .       Home Medications Prior to Admission medications   Medication Sig Start Date End Date Taking? Authorizing Provider  acetaminophen (TYLENOL) 500 MG tablet Take 500 mg by mouth every 6 (six) hours as needed for moderate pain.    [provider]  aspirin EC 81 MG tablet Take 81 mg by mouth daily. Swallow whole.    [provider]  atorvastatin (LIPITOR) 10 MG tablet Take 1 tablet (10 mg total) by mouth daily. 04/23/22   [name], MD  Biotin 5000 MCG CAPS Take 5,000 mcg by mouth daily.     [provider]  celecoxib (CELEBREX) 200 MG capsule Take 1 capsule (200 mg total) by mouth 2 (two) times daily between meals as needed. 09/02/22   Kathryne Hitch, MD  chlorthalidone (HYGROTON) 25 MG tablet TAKE 1 TABLET (25 MG TOTAL) BY MOUTH DAILY. 09/14/22   [name], MD  diclofenac Sodium (VOLTAREN) 1 % GEL Apply 2 g topically 4 (four)  times daily. 09/30/22   Kirtland Bouchard, PA-C  fluticasone (FLONASE) 50 MCG/ACT nasal spray Place 2 sprays into both nostrils daily. 05/13/21   [name], MD  losartan (COZAAR) 25 MG tablet Take 25 mg by mouth daily. 07/23/21   [provider]  metFORMIN (GLUCOPHAGE-XR) 500 MG 24 hr tablet Take 1 tablet (500 mg total) by mouth 2 (two) times daily with a meal. 10/22/22   [name], MD  Potassium Chloride ER 20 MEQ TBCR Take 1 tablet by mouth daily. 11/10/21   [provider]  PROAIR HFA 108 (90 Base) MCG/ACT inhaler Inhale 1-2 puffs into the lungs every 4 (four) hours as needed for wheezing or shortness of breath.  03/16/18   [provider]  SYMBICORT 80-4.5 MCG/ACT inhaler Inhale 2 puffs into the lungs 2 (two) times daily. 12/08/21   [provider]  triamcinolone cream (KENALOG) 0.1 % Apply topically 2 (two) times daily. 05/13/22   [provider]  Vitamin D, Ergocalciferol, (DRISDOL) 1.25 MG (50000 UNIT) CAPS capsule TAKE 1 CAPSULE BY MOUTH ONE TIME PER WEEK 10/22/22   [name], MD  vitamin E 1000 UNIT capsule Take 1,000 Units by mouth daily. Patient not taking: Reported on 10/22/2022    [provider]      Allergies    Codeine    Review of Systems   Review of Systems  All other systems reviewed and are negative.  Physical Exam Updated Vital Signs BP (!) 154/64   Pulse 92   Temp 98.4 F (36.9 C)   Resp 18   Ht 5\' 5"  (1.651 m)   Wt 120.2 kg   SpO2 96%   BMI 44.10 kg/m  Physical Exam Vitals and nursing note reviewed.  Constitutional:      General: She is not in acute distress.    Appearance: Normal appearance. She is not ill-appearing, toxic-appearing or diaphoretic.  HENT:     Head: Normocephalic and atraumatic.     Jaw: There is normal jaw occlusion. No trismus, tenderness, swelling or pain on movement.     Right Ear: Tympanic membrane, ear canal and external ear normal. No decreased  hearing noted. No drainage, swelling or tenderness. There is no impacted cerumen. No foreign body. No mastoid tenderness. No hemotympanum. Tympanic membrane is not injected, scarred, perforated, erythematous, retracted or bulging.     Left Ear: Tympanic membrane, ear canal and external ear normal. No decreased hearing noted. No drainage, swelling or tenderness. There is no impacted cerumen. No foreign body. No mastoid tenderness. No hemotympanum. Tympanic membrane is not injected, scarred, perforated, erythematous, retracted or bulging.     Nose: Nose normal.     Mouth/Throat:     Mouth: Mucous membranes are moist.     Dentition: Normal dentition.     Pharynx: Oropharynx is clear. Uvula midline. No oropharyngeal exudate or posterior oropharyngeal erythema.  Eyes:     General: No visual field deficit or scleral icterus.       Right eye: No discharge.        Left eye: No discharge.     Extraocular Movements: Extraocular movements intact.     Conjunctiva/sclera: Conjunctivae normal.     Pupils: Pupils are equal, round, and reactive to light.  Cardiovascular:     Rate and Rhythm: Normal rate and regular rhythm.     Heart sounds: No murmur heard. Pulmonary:     Effort: Pulmonary effort is normal.     Breath sounds: Normal breath sounds.  Abdominal:     General: Abdomen is flat.     Palpations: Abdomen is soft.     Tenderness: There is no abdominal tenderness.  Musculoskeletal:        General: Normal range of motion.     Cervical back: Normal range of motion and neck supple. No rigidity or tenderness.     Right lower leg: No edema.     Left lower leg: No edema.  Skin:    General: Skin is warm and dry.     Capillary Refill: Capillary refill takes less than 2 seconds.     Findings: No rash.  Neurological:     General: No focal deficit present.     Mental Status: She is alert and oriented to person, place, and time.     GCS: GCS eye subscore is 4. GCS verbal subscore is 5. GCS motor  subscore is 6.     Cranial Nerves: Cranial nerves 2-12 are intact. No cranial nerve deficit, dysarthria or facial asymmetry.     Sensory: Sensation is intact.     Motor: Motor function is intact. No weakness, tremor, atrophy, abnormal muscle tone or seizure activity.     Coordination: Coordination is intact.     Gait: Gait is intact.  Psychiatric:        Speech: Speech normal.        Behavior: Behavior is cooperative.     ED Results /  Procedures / Treatments   Labs (all labs ordered are listed, but only abnormal results are displayed) Labs Reviewed  CBC - Abnormal; Notable for the following components:      Result Value   RBC 5.13 (*)    MCV 74.5 (*)    MCH 24.6 (*)    All other components within normal limits  COMPREHENSIVE METABOLIC PANEL - Abnormal; Notable for the following components:   Glucose, Bld 203 (*)    Albumin 3.2 (*)    All other components within normal limits  ETHANOL  PROTIME-INR  APTT  DIFFERENTIAL    EKG None  Radiology CT Head Wo Contrast  Result Date: 12/19/2022 CLINICAL DATA:  Headache EXAM: CT HEAD WITHOUT CONTRAST TECHNIQUE: Contiguous axial images were obtained from the base of the skull through the vertex without intravenous contrast. RADIATION DOSE REDUCTION: This exam was performed according to the departmental dose-optimization program which includes automated exposure control, adjustment of the mA and/or kV according to patient size and/or use of iterative reconstruction technique. COMPARISON:  10/04/22 CT head, Brain MR 10/04/22 FINDINGS: Brain: No evidence of acute infarction, hemorrhage, hydrocephalus, extra-axial collection or mass lesion/mass effect. Enlarged and partially sella, unchanged. Vascular: No hyperdense vessel or unexpected calcification. Skull: Likely chronic nasal bone fracture. Sinuses/Orbits: No middle ear or mastoid effusion. Polypoid mucosal thickening in the right maxillary sinus with osseous changes suggestive of chronic right  maxillary sinusitis. Surgical changes around the maxilla bilaterally. Other: None. IMPRESSION: 1. No acute intracranial abnormality. 2. Enlarged and partially sella, which can be seen in the setting of idiopathic intracranial hypertension. 3. Chronic right maxillary sinusitis. Electronically Signed   By: Lorenza Cambridge M.D.   On: 12/19/2022 13:24    Procedures Procedures    Medications Ordered in ED Medications  ketorolac (TORADOL) 15 MG/ML injection 15 mg (15 mg Intravenous Given 12/19/22 1243)  sodium chloride 0.9 % bolus 1,000 mL (1,000 mLs Intravenous New Bag/Given 12/19/22 1243)  ondansetron (ZOFRAN) injection 4 mg (4 mg Intravenous Given 12/19/22 1242)  dexamethasone (DECADRON) injection 4 mg (4 mg Intravenous Given 12/19/22 1243)    ED Course/ Medical Decision Making/ A&P                                 Medical Decision Making Amount and/or Complexity of Data Reviewed Labs: ordered. Decision-making details documented in ED Course. Radiology: ordered. Decision-making details documented in ED Course. ECG/medicine tests: ordered. Decision-making details documented in ED Course.  Risk Prescription drug management.   Medical Decision Making:   KEILLY FATULA is a 72 y.o. female who presented to the ED today with occipital headache detailed above.    Patient's presentation is complicated by their history of advanced age, history of breast cancer, diabetes, hypertension, hyperlipidemia.  Complete initial physical exam performed, notably the patient was in no acute distress.  Neurologically intact.  Cranial nerves intact.  Ears do not appear acutely infected.  No mastoid tenderness.  No meningismus. EOMI, PERRL.  Reviewed and confirmed nursing documentation for past medical history, family history, social history.    Initial Assessment:   With the patient's presentation, emergent considerations for headache include subarachnoid hemorrhage, meningitis, temporal arteritis, glaucoma, cerebral  ischemia, carotid/vertebral dissection, intracranial tumor, Venous sinus thrombosis, carbon monoxide poisoning, acute or chronic subdural hemorrhage.  Other considerations include: Migraine, Cluster headache, Hypertension, Caffeine, alcohol, or drug withdrawal, Pseudotumor cerebri, Arteriovenous malformation, Head injury, Neurocysticercosis, Post-lumbar puncture, Preeclampsia, Tension headache,  Sinusitis, Cervical arthritis, Refractive error causing strain, Dental abscess, Otitis media, Temporomandibular joint syndrome, Depression, Somatoform disorder (eg, somatization) Trigeminal neuralgia, Glossopharyngeal neuralgia.  This is most consistent with an acute complicated illness  Initial Plan:  Screening labs including CBC and Metabolic panel to evaluate for infectious or metabolic etiology of disease.  EKG to evaluate for cardiac pathology Coags, ethanol as part of stroke workup CT brain to assess for intracranial pathology Headache treatment Objective evaluation as below reviewed   Initial Study Results:   Laboratory  All laboratory results reviewed without evidence of clinically relevant pathology.   Exceptions include: glucose 203   EKG EKG was reviewed independently. NSR. PACs. No acute ST-T changes. No STEMI.   Radiology:  All images reviewed independently. Agree with radiology report at this time.   CT Head Wo Contrast  Result Date: 12/19/2022 CLINICAL DATA:  Headache EXAM: CT HEAD WITHOUT CONTRAST TECHNIQUE: Contiguous axial images were obtained from the base of the skull through the vertex without intravenous contrast. RADIATION DOSE REDUCTION: This exam was performed according to the departmental dose-optimization program which includes automated exposure control, adjustment of the mA and/or kV according to patient size and/or use of iterative reconstruction technique. COMPARISON:  10/04/22 CT head, Brain MR 10/04/22 FINDINGS: Brain: No evidence of acute infarction, hemorrhage,  hydrocephalus, extra-axial collection or mass lesion/mass effect. Enlarged and partially sella, unchanged. Vascular: No hyperdense vessel or unexpected calcification. Skull: Likely chronic nasal bone fracture. Sinuses/Orbits: No middle ear or mastoid effusion. Polypoid mucosal thickening in the right maxillary sinus with osseous changes suggestive of chronic right maxillary sinusitis. Surgical changes around the maxilla bilaterally. Other: None. IMPRESSION: 1. No acute intracranial abnormality. 2. Enlarged and partially sella, which can be seen in the setting of idiopathic intracranial hypertension. 3. Chronic right maxillary sinusitis. Electronically Signed   By: Lorenza Cambridge M.D.   On: 12/19/2022 13:24   MM 3D DIAGNOSTIC MAMMOGRAM UNILATERAL RIGHT BREAST  Result Date: 12/09/2022 CLINICAL DATA:  72 year old female with RIGHT breast biopsy performed on 12/22/2021 demonstrating RIGHT breast invasive mammary carcinoma (and benign RIGHT axillary lymph node biopsy), for which the patient saw Dr. Magnus Ivan at [facility], with radioactive seed and surgery scheduled. The patient then went to [facility] for possible 2nd opinion (but the patient states she did not see a Careers adviser) and subsequently did not follow-up on this RIGHT breast cancer. EXAM: DIGITAL DIAGNOSTIC UNILATERAL RIGHT MAMMOGRAM WITH TOMOSYNTHESIS AND CAD; ULTRASOUND RIGHT BREAST LIMITED TECHNIQUE: Right digital diagnostic mammography and breast tomosynthesis was performed. The images were evaluated with computer-aided detection. ; Targeted ultrasound examination of the right breast was performed COMPARISON:  Previous exam(s). ACR Breast Density Category b: There are scattered areas of fibroglandular density. FINDINGS: Full field views of the RIGHT breast demonstrate an enlarging mass within the UPPER-OUTER RIGHT breast and contains a RIBBON biopsy clip. Targeted ultrasound is performed, showing a 0.9 x 1.1 x 1.5 cm irregular hypoechoic mass  containing biopsy clip at the 10:30 position of the RIGHT breast 5 cm from the nipple, previously measuring 0.7 x 0.4 x 0.9 cm on 09/25/2021. RIGHT axillary lymph node with focal cortical thickening of 8 mm (previously 6 mm) contains biopsy clip artifact. IMPRESSION: 1. Enlarging biopsy-proven UPPER OUTER RIGHT breast malignancy now measuring 0.9 x 1.1 x 1.5 cm, previously 0.7 x 0.4 x 0.9 cm on 09/25/2021. Surgery/oncology consultation is again recommended. The patient desires to see Dr. Magnus Ivan and we will arrange for another appointment for the patient. 2. Slightly increased cortical thickening  of the biopsy-proven benign RIGHT axillary lymph node. Consider targeted excision at time of RIGHT breast surgery. RECOMMENDATION: Surgery/oncology consultation. An appointment will be made with Dr. Magnus Ivan. I have discussed the findings and recommendations with the patient. If applicable, a reminder letter will be sent to the patient regarding the next appointment. BI-RADS CATEGORY  6: Known biopsy-proven malignancy. Electronically Signed   By: Harmon Pier M.D.   On: 12/09/2022 16:33   [place] LIMITED ULTRASOUND INCLUDING AXILLA RIGHT BREAST  Result Date: 12/09/2022 CLINICAL DATA:  72 year old female with RIGHT breast biopsy performed on 12/22/2021 demonstrating RIGHT breast invasive mammary carcinoma (and benign RIGHT axillary lymph node biopsy), for which the patient saw Dr. Magnus Ivan at [facility], with radioactive seed and surgery scheduled. The patient then went to [facility] for possible 2nd opinion (but the patient states she did not see a Careers adviser) and subsequently did not follow-up on this RIGHT breast cancer. EXAM: DIGITAL DIAGNOSTIC UNILATERAL RIGHT MAMMOGRAM WITH TOMOSYNTHESIS AND CAD; ULTRASOUND RIGHT BREAST LIMITED TECHNIQUE: Right digital diagnostic mammography and breast tomosynthesis was performed. The images were evaluated with computer-aided detection. ; Targeted ultrasound examination of the  right breast was performed COMPARISON:  Previous exam(s). ACR Breast Density Category b: There are scattered areas of fibroglandular density. FINDINGS: Full field views of the RIGHT breast demonstrate an enlarging mass within the UPPER-OUTER RIGHT breast and contains a RIBBON biopsy clip. Targeted ultrasound is performed, showing a 0.9 x 1.1 x 1.5 cm irregular hypoechoic mass containing biopsy clip at the 10:30 position of the RIGHT breast 5 cm from the nipple, previously measuring 0.7 x 0.4 x 0.9 cm on 09/25/2021. RIGHT axillary lymph node with focal cortical thickening of 8 mm (previously 6 mm) contains biopsy clip artifact. IMPRESSION: 1. Enlarging biopsy-proven UPPER OUTER RIGHT breast malignancy now measuring 0.9 x 1.1 x 1.5 cm, previously 0.7 x 0.4 x 0.9 cm on 09/25/2021. Surgery/oncology consultation is again recommended. The patient desires to see Dr. Magnus Ivan and we will arrange for another appointment for the patient. 2. Slightly increased cortical thickening of the biopsy-proven benign RIGHT axillary lymph node. Consider targeted excision at time of RIGHT breast surgery. RECOMMENDATION: Surgery/oncology consultation. An appointment will be made with Dr. Magnus Ivan. I have discussed the findings and recommendations with the patient. If applicable, a reminder letter will be sent to the patient regarding the next appointment. BI-RADS CATEGORY  6: Known biopsy-proven malignancy. Electronically Signed   By: Harmon Pier M.D.   On: 12/09/2022 16:33   NCV with EMG (electromyography)  Result Date: 11/25/2022 Tyrell Antonio, MD     12/01/2022  6:28 AM EMG & NCV Findings: Evaluation of the right median (across palm) sensory nerve showed prolonged distal peak latency (Wrist, 4.0 ms) and prolonged distal peak latency (Palm, 2.4 ms).  All remaining nerves (as indicated in the following tables) were within normal limits.  All examined muscles (as indicated in the following table) showed no evidence of electrical  instability.  Impression: The above electrodiagnostic study is ABNORMAL and reveals evidence of a mild right median nerve entrapment at the wrist (carpal tunnel syndrome) affecting sensory components. There is no significant electrodiagnostic evidence of any other focal nerve entrapment, brachial plexopathy or cervical radiculopathy.  As you know, this particular electrodiagnostic study cannot rule out chemical radiculitis or sensory only radiculopathy.  **This electrodiagnostic study cannot rule out small fiber polyneuropathy and dysesthesias from central pain syndromes such as stroke or central pain sensitization syndromes such as fibromyalgia.  Myotomal referral pain from  trigger points is also not excluded. Recommendations: 1.  Follow-up with referring physician. 2.  Continue current management of symptoms. 3.  Continue use of resting splint at night-time and as needed during the day. ___________________________ Elease Hashimoto Board Certified, American Board of Physical Medicine and Rehabilitation Nerve Conduction Studies Anti Sensory Summary Table  Stim Site NR Peak (ms) Norm Peak (ms) P-T Amp (V) Norm P-T Amp Site1 Site2 Delta-P (ms) Dist (cm) Vel (m/s) Norm Vel (m/s) Right Median Acr Palm Anti Sensory (2nd Digit)  31.3C Wrist    *4.0 <3.6 17.8 >10 Wrist Palm 1.6 0.0   Palm    *2.4 <2.0 2.6        Right Radial Anti Sensory (Base 1st Digit)  30.8C Wrist    2.3 <3.1 11.9  Wrist Base 1st Digit 2.3 0.0   Right Ulnar Anti Sensory (5th Digit)  31.8C Wrist    3.1 <3.7 18.3 >15.0 Wrist 5th Digit 3.1 14.0 45 >38 Motor Summary Table  Stim Site NR Onset (ms) Norm Onset (ms) O-P Amp (mV) Norm O-P Amp Site1 Site2 Delta-0 (ms) Dist (cm) Vel (m/s) Norm Vel (m/s) Right Median Motor (Abd Poll Brev)  31.6C Wrist    3.5 <4.2 8.9 >5 Elbow Wrist 4.1 22.0 54 >50 Elbow    7.6  8.5        Right Ulnar Motor (Abd Dig Min)  32C Wrist    2.8 <4.2 7.7 >3 B Elbow Wrist 3.1 19.0 61 >53 B Elbow    5.9  7.4  A Elbow B Elbow 1.2 10.5  88 >53 A Elbow    7.1  7.4        EMG  Side Muscle Nerve Root Ins Act Fibs Psw Amp Dur Poly Recrt Int Dennie Bible Comment Right Abd Poll Brev Median C8-T1 Nml Nml Nml Nml Nml 0 Nml Nml  Right 1stDorInt Ulnar C8-T1 Nml Nml Nml Nml Nml 0 Nml Nml  Right PronatorTeres Median C6-7 Nml Nml Nml Nml Nml 0 Nml Nml  Right Biceps Musculocut C5-6 Nml Nml Nml Nml Nml 0 Nml Nml  Right Deltoid Axillary C5-6 Nml Nml Nml Nml Nml 0 Nml Nml  Nerve Conduction Studies Anti Sensory Left/Right Comparison  Stim Site L Lat (ms) R Lat (ms) L-R Lat (ms) L Amp (V) R Amp (V) L-R Amp (%) Site1 Site2 L Vel (m/s) R Vel (m/s) L-R Vel (m/s) Median Acr Palm Anti Sensory (2nd Digit)  31.3C Wrist  *4.0   17.8  Wrist Palm    Palm  *2.4   2.6       Radial Anti Sensory (Base 1st Digit)  30.8C Wrist  2.3   11.9  Wrist Base 1st Digit    Ulnar Anti Sensory (5th Digit)  31.8C Wrist  3.1   18.3  Wrist 5th Digit  45  Motor Left/Right Comparison  Stim Site L Lat (ms) R Lat (ms) L-R Lat (ms) L Amp (mV) R Amp (mV) L-R Amp (%) Site1 Site2 L Vel (m/s) R Vel (m/s) L-R Vel (m/s) Median Motor (Abd Poll Brev)  31.6C Wrist  3.5   8.9  Elbow Wrist  54  Elbow  7.6   8.5       Ulnar Motor (Abd Dig Min)  32C Wrist  2.8   7.7  B Elbow Wrist  61  B Elbow  5.9   7.4  A Elbow B Elbow  88  A Elbow  7.1   7.4  Waveforms:           Final Assessment and Plan:   72 year old female presents to ED c/o intermittent, sharp occipital headache on the right with some extension behind the ER for the last 2 days.  No fall or head injury.  No thunderclap onset.  Does state the headache is severe in intensity rated as an 8/10.  No associated symptoms.  Ears do not appear infected.  No mastoid tenderness.  No meningismus.  Nonfocal neuroexam.  With new onset of severe headache, proceeded with workup as above for further evaluation.  The patient does have a history of migraines listed in her chart she says that this was many years ago and she only has them infrequently.  No acute findings  on CT brain.  Blood work overall reassuring.  Patient's headache completely resolved with medications and fluids as given above.  Remains neurologically intact.  Nontoxic-appearing.  Slightly hypertensive but otherwise vital signs reassuring.  She has follow-up scheduled with neurology in a few weeks for evaluation of unrelated symptoms. Discussed with pt concern for complex migraine vs occipital neuralgia vs other neurologic process and should she continue to have recurrent, similar headaches she will discuss this at her follow up with neurology. Given strict ED return precautions, all questions answered, and stable for discharge.    Clinical Impression:  1. Unilateral occipital headache      Discharge           Final Clinical Impression(s) / ED Diagnoses Final diagnoses:  Unilateral occipital headache    Rx / DC Orders ED Discharge Orders     None         Tonette Lederer, PA-C 12/19/22 1350    Coral Spikes, DO 12/19/22 2055

## 2022-12-19 NOTE — ED Triage Notes (Signed)
C/o sharp pain behind her right ear onset thurs. No change today denies sore throat.

## 2022-12-19 NOTE — ED Notes (Signed)
Patient transported to CT 

## 2022-12-21 ENCOUNTER — Telehealth: Payer: Self-pay

## 2022-12-21 NOTE — Transitions of Care (Post Inpatient/ED Visit) (Unsigned)
   12/21/2022  Name: Bianca Medina MRN: [id number] DOB: [birth year]  Today's TOC FU Call Status: Today's TOC FU Call Status:: Unsuccessful Call (1st Attempt) Unsuccessful Call (1st Attempt) Date: 12/21/22  Attempted to reach the patient regarding the most recent Inpatient/ED visit.  Follow Up Plan: Additional outreach attempts will be made to reach the patient to complete the Transitions of Care (Post Inpatient/ED visit) call.   Signature Arvil Persons, BSN, Charity fundraiser

## 2022-12-22 ENCOUNTER — Telehealth: Payer: Self-pay | Admitting: Family Medicine

## 2022-12-22 DIAGNOSIS — M25561 Pain in right knee: Secondary | ICD-10-CM | POA: Diagnosis not present

## 2022-12-22 DIAGNOSIS — M79601 Pain in right arm: Secondary | ICD-10-CM | POA: Diagnosis not present

## 2022-12-22 DIAGNOSIS — R262 Difficulty in walking, not elsewhere classified: Secondary | ICD-10-CM | POA: Diagnosis not present

## 2022-12-22 DIAGNOSIS — R531 Weakness: Secondary | ICD-10-CM | POA: Diagnosis not present

## 2022-12-22 NOTE — Telephone Encounter (Signed)
Noted  

## 2022-12-22 NOTE — Telephone Encounter (Signed)
Pt called today @ 9:27 stated she is returning your call. Please call the pt

## 2022-12-22 NOTE — Transitions of Care (Post Inpatient/ED Visit) (Signed)
   12/22/2022  Name: Bianca Medina MRN: [id number] DOB: February 09, 1951  Today's TOC FU Call Status: Today's TOC FU Call Status:: Successful TOC FU Call Completed Unsuccessful Call (1st Attempt) Date: 12/21/22 [facility] FU Call Complete Date: 12/22/22  Transition Care Management Follow-up Telephone Call Date of Discharge: 12/18/22 Discharge Facility: [facility]) Type of Discharge: Emergency Department How have you been since you were released from the hospital?: Better Any questions or concerns?: No  Items Reviewed: Did you receive and understand the discharge instructions provided?: Yes Any new allergies since your discharge?: No Dietary orders reviewed?: NA Do you have support at home?: Yes  Medications Reviewed Today: Medications Reviewed Today   Medications were not reviewed in this encounter     Home Care and Equipment/Supplies: Were Home Health Services Ordered?: NA Any new equipment or medical supplies ordered?: NA  Functional Questionnaire: Do you need assistance with bathing/showering or dressing?: No Do you need assistance with meal preparation?: No Do you need assistance with eating?: No Do you have difficulty maintaining continence: No Do you need assistance with getting out of bed/getting out of a chair/moving?: No Do you have difficulty managing or taking your medications?: No  Follow up appointments reviewed: PCP Follow-up appointment confirmed?: NA Specialist Hospital Follow-up appointment confirmed?: NA Do you need transportation to your follow-up appointment?: No Do you understand care options if your condition(s) worsen?: Yes-patient verbalized understanding    SIGNATURE Arvil Persons, BSN, RN

## 2022-12-24 ENCOUNTER — Ambulatory Visit (INDEPENDENT_AMBULATORY_CARE_PROVIDER_SITE_OTHER): Payer: Medicare PPO | Admitting: Family Medicine

## 2022-12-24 ENCOUNTER — Encounter: Payer: Self-pay | Admitting: Family Medicine

## 2022-12-24 VITALS — BP 124/70 | HR 61 | Temp 98.0°F | Ht 65.0 in | Wt 270.0 lb

## 2022-12-24 DIAGNOSIS — E118 Type 2 diabetes mellitus with unspecified complications: Secondary | ICD-10-CM | POA: Diagnosis not present

## 2022-12-24 DIAGNOSIS — J320 Chronic maxillary sinusitis: Secondary | ICD-10-CM

## 2022-12-24 DIAGNOSIS — R0982 Postnasal drip: Secondary | ICD-10-CM | POA: Diagnosis not present

## 2022-12-24 DIAGNOSIS — Z7984 Long term (current) use of oral hypoglycemic drugs: Secondary | ICD-10-CM

## 2022-12-24 DIAGNOSIS — E559 Vitamin D deficiency, unspecified: Secondary | ICD-10-CM | POA: Diagnosis not present

## 2022-12-24 MED ORDER — FLUTICASONE PROPIONATE 50 MCG/ACT NA SUSP
2.0000 | Freq: Every day | NASAL | 6 refills | Status: AC
Start: 2022-12-24 — End: ?

## 2022-12-24 NOTE — Progress Notes (Signed)
Established Patient Office Visit   Subjective:  Patient ID: Bianca Medina, female    DOB: 1950-09-02  Age: 72 y.o. MRN: [id number]  Chief Complaint  Patient presents with   Hospitalization Follow-up    Pt states she is doing better since ER follow up.     HPI Encounter Diagnoses  Name Primary?   Vitamin D deficiency, unspecified Yes   Type 2 diabetes mellitus with complication, without long-term current use of insulin (HCC)    Chronic maxillary sinusitis    Post-nasal drip    For follow-up of recent emergency room visit for right occipital headache.  It has since resolved.  CT obtained was essentially normal other than chronic changes seen described as chronic maxillary sinusitis with mucosal thickening on the right and a chronically enlarged sella.  She has been prescribed Flonase for postnasal drip but has not been taking it recently.  She has not had a follow-up with ENT recently.  She does have scheduled follow-up with neurology in the near future.  She claims compliance with her weekly vitamin D tablet as well as twice daily dosing of her Glucophage.   Review of Systems  Constitutional: Negative.   HENT: Negative.  Negative for congestion and sinus pain.   Eyes:  Negative for blurred vision, discharge and redness.  Respiratory: Negative.    Cardiovascular: Negative.   Gastrointestinal:  Negative for abdominal pain.  Genitourinary: Negative.   Musculoskeletal: Negative.  Negative for myalgias.  Skin:  Negative for rash.  Neurological:  Negative for tingling, loss of consciousness, weakness and headaches.  Endo/Heme/Allergies:  Negative for polydipsia.     Current Outpatient Medications:    acetaminophen (TYLENOL) 500 MG tablet, Take 500 mg by mouth every 6 (six) hours as needed for moderate pain., Disp: , Rfl:    aspirin EC 81 MG tablet, Take 81 mg by mouth daily. Swallow whole., Disp: , Rfl:    atorvastatin (LIPITOR) 10 MG tablet, Take 1 tablet (10 mg total) by mouth  daily., Disp: 90 tablet, Rfl: 1   Biotin 5000 MCG CAPS, Take 5,000 mcg by mouth daily. , Disp: , Rfl:    celecoxib (CELEBREX) 200 MG capsule, Take 1 capsule (200 mg total) by mouth 2 (two) times daily between meals as needed., Disp: 60 capsule, Rfl: 3   chlorthalidone (HYGROTON) 25 MG tablet, TAKE 1 TABLET (25 MG TOTAL) BY MOUTH DAILY., Disp: 90 tablet, Rfl: 1   diclofenac Sodium (VOLTAREN) 1 % GEL, Apply 2 g topically 4 (four) times daily., Disp: 350 g, Rfl: 1   losartan (COZAAR) 25 MG tablet, Take 25 mg by mouth daily., Disp: , Rfl:    metFORMIN (GLUCOPHAGE-XR) 500 MG 24 hr tablet, Take 1 tablet (500 mg total) by mouth 2 (two) times daily with a meal., Disp: 180 tablet, Rfl: 1   Potassium Chloride ER 20 MEQ TBCR, Take 1 tablet by mouth daily., Disp: , Rfl:    PROAIR HFA 108 (90 Base) MCG/ACT inhaler, Inhale 1-2 puffs into the lungs every 4 (four) hours as needed for wheezing or shortness of breath. , Disp: , Rfl:    SYMBICORT 80-4.5 MCG/ACT inhaler, Inhale 2 puffs into the lungs 2 (two) times daily., Disp: , Rfl:    triamcinolone cream (KENALOG) 0.1 %, Apply topically 2 (two) times daily., Disp: , Rfl:    Vitamin D, Ergocalciferol, (DRISDOL) 1.25 MG (50000 UNIT) CAPS capsule, TAKE 1 CAPSULE BY MOUTH ONE TIME PER WEEK, Disp: 12 capsule, Rfl: 2   vitamin  E 1000 UNIT capsule, Take 1,000 Units by mouth daily., Disp: , Rfl:    fluticasone (FLONASE) 50 MCG/ACT nasal spray, Place 2 sprays into both nostrils daily., Disp: 16 g, Rfl: 6   Objective:     BP 124/70   Pulse 61   Temp 98 F (36.7 C)   Ht 5\' 5"  (1.651 m)   Wt 270 lb (122.5 kg)   SpO2 95%   BMI 44.93 kg/m    Physical Exam Constitutional:      General: She is not in acute distress.    Appearance: Normal appearance. She is not ill-appearing, toxic-appearing or diaphoretic.  HENT:     Head: Normocephalic and atraumatic.     Comments: There is no tenderness to palpation of the maxillary sinuses.    Right Ear: Tympanic membrane, ear  canal and external ear normal.     Left Ear: Tympanic membrane, ear canal and external ear normal.     Mouth/Throat:     Mouth: Mucous membranes are moist.     Pharynx: Oropharynx is clear. No oropharyngeal exudate or posterior oropharyngeal erythema.  Eyes:     General: No scleral icterus.       Right eye: No discharge.        Left eye: No discharge.     Extraocular Movements: Extraocular movements intact.     Conjunctiva/sclera: Conjunctivae normal.     Pupils: Pupils are equal, round, and reactive to light.  Cardiovascular:     Rate and Rhythm: Normal rate and regular rhythm.  Pulmonary:     Effort: Pulmonary effort is normal. No respiratory distress.     Breath sounds: No wheezing, rhonchi or rales.  Skin:    General: Skin is warm and dry.  Neurological:     Mental Status: She is alert and oriented to person, place, and time.  Psychiatric:        Mood and Affect: Mood normal.        Behavior: Behavior normal.      No results found for any visits on 12/24/22.    The 10-year ASCVD risk score (Arnett DK, et al., 2019) is: 20.7%    Assessment & Plan:   Vitamin D deficiency, unspecified  Type 2 diabetes mellitus with complication, without long-term current use of insulin (HCC)  Chronic maxillary sinusitis -     Ambulatory referral to ENT -     Fluticasone Propionate; Place 2 sprays into both nostrils daily.  Dispense: 16 g; Refill: 6  Post-nasal drip -     Fluticasone Propionate; Place 2 sprays into both nostrils daily.  Dispense: 16 g; Refill: 6    Return She should have scheduled follow-up with me next month..  Continue high dose weekly vitamin D and metformin twice daily.  Restart fluticasone.  [name], MD

## 2022-12-25 DIAGNOSIS — R531 Weakness: Secondary | ICD-10-CM | POA: Diagnosis not present

## 2022-12-25 DIAGNOSIS — M79601 Pain in right arm: Secondary | ICD-10-CM | POA: Diagnosis not present

## 2022-12-25 DIAGNOSIS — M25561 Pain in right knee: Secondary | ICD-10-CM | POA: Diagnosis not present

## 2022-12-25 DIAGNOSIS — R262 Difficulty in walking, not elsewhere classified: Secondary | ICD-10-CM | POA: Diagnosis not present

## 2022-12-28 ENCOUNTER — Telehealth: Payer: Self-pay | Admitting: Family Medicine

## 2022-12-28 NOTE — Telephone Encounter (Signed)
FYI:Pt wants Dr Doreene Burke to know someone has sent a refill in for Gabapentin for her and she does not know who.

## 2022-12-29 DIAGNOSIS — R531 Weakness: Secondary | ICD-10-CM | POA: Diagnosis not present

## 2022-12-29 DIAGNOSIS — M25561 Pain in right knee: Secondary | ICD-10-CM | POA: Diagnosis not present

## 2022-12-29 DIAGNOSIS — R262 Difficulty in walking, not elsewhere classified: Secondary | ICD-10-CM | POA: Diagnosis not present

## 2022-12-29 DIAGNOSIS — M79601 Pain in right arm: Secondary | ICD-10-CM | POA: Diagnosis not present

## 2022-12-30 ENCOUNTER — Ambulatory Visit: Payer: Medicare PPO | Admitting: Orthopaedic Surgery

## 2022-12-30 ENCOUNTER — Other Ambulatory Visit (INDEPENDENT_AMBULATORY_CARE_PROVIDER_SITE_OTHER): Payer: Medicare PPO

## 2022-12-30 DIAGNOSIS — M25562 Pain in left knee: Secondary | ICD-10-CM

## 2022-12-30 DIAGNOSIS — M79601 Pain in right arm: Secondary | ICD-10-CM

## 2022-12-30 DIAGNOSIS — M7701 Medial epicondylitis, right elbow: Secondary | ICD-10-CM

## 2022-12-30 NOTE — Progress Notes (Signed)
The patient is being seen in follow-up after having right upper extremity nerve conduction studies.  This ended up showing only mild carpal tunnel syndrome.  There is no evidence of nerve compression of the ulnar nerve at the cubital tunnel.  There is also no evidence of any abnormalities of the brachial plexus or the cervical spine.  She still has some right arm pain and today she is pointing to the upper aspect of her arm.  She is also been having some left knee pain.  She is someone with a BMI of almost 45.  Also her blood glucose has not been under great control and her last hemoglobin A1c was 7.9 and that had been a slow rise over a year.  She says she is try to work on better control of these things.  Examination of her left knee shows that both knees hyperextend.  There is patellofemoral crepitation and actually 2 views of the left knee show no acute findings.  There is patellofemoral arthritis.  Examination of her right elbow shows no pain over the cubital tunnel or the medial epicondyle today.  She has good grip and pinch strength.  She has good range of motion of her elbow and her shoulder.  From my standpoint the best thing that she can do is work on blood glucose control and weight loss.  I would not recommend any type of steroid injections at this point or placing her on any other medications other than occasional Tylenol alternating with an over-the-counter NSAID.  We can see her back in 3 months to see how she is doing overall.  She has been having physical therapy that was recommended elsewhere for her spine.  If she is wanting [place] to address her lumbar spine at that next visit we can get an AP and lateral of the lumbar spine.

## 2022-12-31 ENCOUNTER — Encounter (INDEPENDENT_AMBULATORY_CARE_PROVIDER_SITE_OTHER): Payer: Self-pay

## 2023-01-04 DIAGNOSIS — M79601 Pain in right arm: Secondary | ICD-10-CM | POA: Diagnosis not present

## 2023-01-04 DIAGNOSIS — M25561 Pain in right knee: Secondary | ICD-10-CM | POA: Diagnosis not present

## 2023-01-04 DIAGNOSIS — R531 Weakness: Secondary | ICD-10-CM | POA: Diagnosis not present

## 2023-01-04 DIAGNOSIS — R262 Difficulty in walking, not elsewhere classified: Secondary | ICD-10-CM | POA: Diagnosis not present

## 2023-01-05 DIAGNOSIS — C50919 Malignant neoplasm of unspecified site of unspecified female breast: Secondary | ICD-10-CM | POA: Diagnosis not present

## 2023-01-05 DIAGNOSIS — E559 Vitamin D deficiency, unspecified: Secondary | ICD-10-CM | POA: Diagnosis not present

## 2023-01-05 DIAGNOSIS — R002 Palpitations: Secondary | ICD-10-CM | POA: Diagnosis not present

## 2023-01-05 DIAGNOSIS — E119 Type 2 diabetes mellitus without complications: Secondary | ICD-10-CM | POA: Diagnosis not present

## 2023-01-05 DIAGNOSIS — E782 Mixed hyperlipidemia: Secondary | ICD-10-CM | POA: Diagnosis not present

## 2023-01-05 DIAGNOSIS — I10 Essential (primary) hypertension: Secondary | ICD-10-CM | POA: Diagnosis not present

## 2023-01-06 ENCOUNTER — Ambulatory Visit: Payer: Medicare PPO | Admitting: Orthopaedic Surgery

## 2023-01-06 DIAGNOSIS — M79601 Pain in right arm: Secondary | ICD-10-CM | POA: Diagnosis not present

## 2023-01-06 DIAGNOSIS — R262 Difficulty in walking, not elsewhere classified: Secondary | ICD-10-CM | POA: Diagnosis not present

## 2023-01-06 DIAGNOSIS — M25561 Pain in right knee: Secondary | ICD-10-CM | POA: Diagnosis not present

## 2023-01-06 DIAGNOSIS — R531 Weakness: Secondary | ICD-10-CM | POA: Diagnosis not present

## 2023-01-12 DIAGNOSIS — M25561 Pain in right knee: Secondary | ICD-10-CM | POA: Diagnosis not present

## 2023-01-12 DIAGNOSIS — M79601 Pain in right arm: Secondary | ICD-10-CM | POA: Diagnosis not present

## 2023-01-12 DIAGNOSIS — R262 Difficulty in walking, not elsewhere classified: Secondary | ICD-10-CM | POA: Diagnosis not present

## 2023-01-12 DIAGNOSIS — R531 Weakness: Secondary | ICD-10-CM | POA: Diagnosis not present

## 2023-01-13 DIAGNOSIS — C50919 Malignant neoplasm of unspecified site of unspecified female breast: Secondary | ICD-10-CM | POA: Diagnosis not present

## 2023-01-18 ENCOUNTER — Other Ambulatory Visit: Payer: Self-pay

## 2023-01-18 ENCOUNTER — Encounter (HOSPITAL_COMMUNITY): Payer: Self-pay

## 2023-01-18 ENCOUNTER — Emergency Department (HOSPITAL_COMMUNITY)
Admission: EM | Admit: 2023-01-18 | Discharge: 2023-01-18 | Disposition: A | Discharge status: Home / Self Care | Payer: Medicare PPO | Source: Home / Self Care | Attending: Emergency Medicine | Admitting: Emergency Medicine

## 2023-01-18 DIAGNOSIS — Z79899 Other long term (current) drug therapy: Secondary | ICD-10-CM | POA: Insufficient documentation

## 2023-01-18 DIAGNOSIS — R531 Weakness: Secondary | ICD-10-CM | POA: Insufficient documentation

## 2023-01-18 DIAGNOSIS — Z7984 Long term (current) use of oral hypoglycemic drugs: Secondary | ICD-10-CM | POA: Insufficient documentation

## 2023-01-18 DIAGNOSIS — Z7982 Long term (current) use of aspirin: Secondary | ICD-10-CM | POA: Insufficient documentation

## 2023-01-18 DIAGNOSIS — I10 Essential (primary) hypertension: Secondary | ICD-10-CM | POA: Diagnosis not present

## 2023-01-18 DIAGNOSIS — E119 Type 2 diabetes mellitus without complications: Secondary | ICD-10-CM | POA: Insufficient documentation

## 2023-01-18 LAB — URINALYSIS, W/ REFLEX TO CULTURE (INFECTION SUSPECTED)
Bilirubin Urine: NEGATIVE
Glucose, UA: NEGATIVE mg/dL
Hgb urine dipstick: NEGATIVE
Ketones, ur: NEGATIVE mg/dL
Leukocytes,Ua: NEGATIVE
Nitrite: NEGATIVE
Protein, ur: NEGATIVE mg/dL
Specific Gravity, Urine: 1.017 (ref 1.005–1.030)
pH: 8 (ref 5.0–8.0)

## 2023-01-18 LAB — CBC WITH DIFFERENTIAL/PLATELET
Abs Immature Granulocytes: 0.02 10*3/uL (ref 0.00–0.07)
Basophils Absolute: 0.1 10*3/uL (ref 0.0–0.1)
Basophils Relative: 1 %
Eosinophils Absolute: 0.2 10*3/uL (ref 0.0–0.5)
Eosinophils Relative: 2 %
HCT: 37.8 % (ref 36.0–46.0)
Hemoglobin: 12.7 g/dL (ref 12.0–15.0)
Immature Granulocytes: 0 %
Lymphocytes Relative: 21 %
Lymphs Abs: 2.1 10*3/uL (ref 0.7–4.0)
MCH: 25.5 pg — ABNORMAL LOW (ref 26.0–34.0)
MCHC: 33.6 g/dL (ref 30.0–36.0)
MCV: 75.9 fL — ABNORMAL LOW (ref 80.0–100.0)
Monocytes Absolute: 0.9 10*3/uL (ref 0.1–1.0)
Monocytes Relative: 9 %
Neutro Abs: 7 10*3/uL (ref 1.7–7.7)
Neutrophils Relative %: 67 %
Platelets: 287 10*3/uL (ref 150–400)
RBC: 4.98 MIL/uL (ref 3.87–5.11)
RDW: 14.9 % (ref 11.5–15.5)
WBC: 10.2 10*3/uL (ref 4.0–10.5)
nRBC: 0 % (ref 0.0–0.2)

## 2023-01-18 LAB — COMPREHENSIVE METABOLIC PANEL
ALT: 19 U/L (ref 0–44)
AST: 19 U/L (ref 15–41)
Albumin: 3.3 g/dL — ABNORMAL LOW (ref 3.5–5.0)
Alkaline Phosphatase: 82 U/L (ref 38–126)
Anion gap: 8 (ref 5–15)
BUN: 13 mg/dL (ref 8–23)
CO2: 31 mmol/L (ref 22–32)
Calcium: 9.4 mg/dL (ref 8.9–10.3)
Chloride: 97 mmol/L — ABNORMAL LOW (ref 98–111)
Creatinine, Ser: 0.8 mg/dL (ref 0.44–1.00)
GFR, Estimated: >60 mL/min (ref >60)
Glucose, Bld: 145 mg/dL — ABNORMAL HIGH (ref 70–99)
Potassium: 3.8 mmol/L (ref 3.5–5.1)
Sodium: 136 mmol/L (ref 135–145)
Total Bilirubin: 1 mg/dL (ref 0.3–1.2)
Total Protein: 6.9 g/dL (ref 6.5–8.1)

## 2023-01-18 LAB — RAPID URINE DRUG SCREEN, HOSP PERFORMED
Amphetamines: NOT DETECTED
Barbiturates: NOT DETECTED
Benzodiazepines: NOT DETECTED
Cocaine: NOT DETECTED
Opiates: NOT DETECTED
Tetrahydrocannabinol: NOT DETECTED

## 2023-01-18 LAB — ETHANOL: Alcohol, Ethyl (B): <10 mg/dL (ref <10)
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
## 2023-01-18 NOTE — ED Provider Notes (Signed)
[facility] EMERGENCY DEPARTMENT AT [facility] Provider Note   CSN: [id number] Arrival date & time: 01/18/23  0865     History  Chief Complaint  Patient presents with   Exposure Concern    Bianca Medina is a 72 y.o. female.  Patient is a 72 year old female with a history of GERD, hypertension, hyperlipidemia, diabetes who presents with concerns for a toxic exposure.  She says that she is concerned that she is being poisoned by methylamine.  She reports that she had an exposure to this substance from his food source in 2020.  She is concerned that she is being poisoned.  She says that she thinks people are breaking into her house and she is noticing that things are missing and then will come back again such as her toothpaste and thinks that it is being laced with something.  She has some generalized weakness.  She denies any other recent illnesses.  No cough or cold symptoms.  No vomiting or diarrhea.  No fevers.  She denies any known psych history other than anxiety.  She says she is feeling anxious because she feels these people are breaking into her house.  She thinks there are people from her church.  She denies any hallucinations.       Home Medications Prior to Admission medications   Medication Sig Start Date End Date Taking? Authorizing Provider  acetaminophen (TYLENOL) 500 MG tablet Take 500 mg by mouth every 6 (six) hours as needed for moderate pain.    [provider]  aspirin EC 81 MG tablet Take 81 mg by mouth daily. Swallow whole.    [provider]  atorvastatin (LIPITOR) 10 MG tablet Take 1 tablet (10 mg total) by mouth daily. 04/23/22   [name], MD  Biotin 5000 MCG CAPS Take 5,000 mcg by mouth daily.     [provider]  celecoxib (CELEBREX) 200 MG capsule Take 1 capsule (200 mg total) by mouth 2 (two) times daily between meals as needed. 09/02/22   Kathryne Hitch, MD  chlorthalidone (HYGROTON) 25 MG tablet TAKE  1 TABLET (25 MG TOTAL) BY MOUTH DAILY. 09/14/22   [name], MD  diclofenac Sodium (VOLTAREN) 1 % GEL Apply 2 g topically 4 (four) times daily. 09/30/22   Kirtland Bouchard, PA-C  fluticasone (FLONASE) 50 MCG/ACT nasal spray Place 2 sprays into both nostrils daily. 12/24/22   [name], MD  losartan (COZAAR) 25 MG tablet Take 25 mg by mouth daily. 07/23/21   [provider]  metFORMIN (GLUCOPHAGE-XR) 500 MG 24 hr tablet Take 1 tablet (500 mg total) by mouth 2 (two) times daily with a meal. 10/22/22   [name], MD  Potassium Chloride ER 20 MEQ TBCR Take 1 tablet by mouth daily. 11/10/21   [provider]  PROAIR HFA 108 (90 Base) MCG/ACT inhaler Inhale 1-2 puffs into the lungs every 4 (four) hours as needed for wheezing or shortness of breath.  03/16/18   [provider]  SYMBICORT 80-4.5 MCG/ACT inhaler Inhale 2 puffs into the lungs 2 (two) times daily. 12/08/21   [provider]  triamcinolone cream (KENALOG) 0.1 % Apply topically 2 (two) times daily. 05/13/22   [provider]  Vitamin D, Ergocalciferol, (DRISDOL) 1.25 MG (50000 UNIT) CAPS capsule TAKE 1 CAPSULE BY MOUTH ONE TIME PER WEEK 10/22/22   [name], MD  vitamin E 1000 UNIT capsule Take 1,000 Units by mouth daily.  [provider]      Allergies    Codeine    Review of Systems   Review of Systems  Constitutional:  Positive for fatigue. Negative for chills, diaphoresis and fever.  HENT:  Negative for congestion, rhinorrhea and sneezing.   Eyes: Negative.   Respiratory:  Negative for cough, chest tightness and shortness of breath.   Cardiovascular:  Negative for chest pain and leg swelling.  Gastrointestinal:  Negative for abdominal pain, blood in stool, diarrhea, nausea and vomiting.  Genitourinary:  Negative for difficulty urinating, flank pain, frequency and hematuria.  Musculoskeletal:  Negative for arthralgias and back pain.  Skin:   Negative for rash.  Neurological:  Positive for weakness (Generalized). Negative for dizziness, speech difficulty, numbness and headaches.    Physical Exam Updated Vital Signs BP (!) 140/75   Pulse 72   Temp 99.1 F (37.3 C) (Oral)   Resp 18   SpO2 95%  Physical Exam Constitutional:      Appearance: She is well-developed.  HENT:     Head: Normocephalic and atraumatic.  Eyes:     Pupils: Pupils are equal, round, and reactive to light.  Cardiovascular:     Rate and Rhythm: Normal rate and regular rhythm.     Heart sounds: Normal heart sounds.  Pulmonary:     Effort: Pulmonary effort is normal. No respiratory distress.     Breath sounds: Normal breath sounds. No wheezing or rales.  Chest:     Chest wall: No tenderness.  Abdominal:     General: Bowel sounds are normal.     Palpations: Abdomen is soft.     Tenderness: There is no abdominal tenderness. There is no guarding or rebound.  Musculoskeletal:        General: Normal range of motion.     Cervical back: Normal range of motion and neck supple.  Lymphadenopathy:     Cervical: No cervical adenopathy.  Skin:    General: Skin is warm and dry.     Findings: No rash.  Neurological:     General: No focal deficit present.     Mental Status: She is alert and oriented to person, place, and time.     ED Results / Procedures / Treatments   Labs (all labs ordered are listed, but only abnormal results are displayed) Labs Reviewed  COMPREHENSIVE METABOLIC PANEL - Abnormal; Notable for the following components:      Result Value   Chloride 97 (*)    Glucose, Bld 145 (*)    Albumin 3.3 (*)    All other components within normal limits  CBC WITH DIFFERENTIAL/PLATELET - Abnormal; Notable for the following components:   MCV 75.9 (*)    MCH 25.5 (*)    All other components within normal limits  URINALYSIS, W/ REFLEX TO CULTURE (INFECTION SUSPECTED) - Abnormal; Notable for the following components:   APPearance HAZY (*)     Bacteria, UA MANY (*)    All other components within normal limits  RAPID URINE DRUG SCREEN, HOSP PERFORMED  ETHANOL    EKG EKG Interpretation Date/Time:  Monday January 18 2023 10:15:28 EDT Ventricular Rate:  60 PR Interval:  162 QRS Duration:  89 QT Interval:  413 QTC Calculation: 413 R Axis:   24  Text Interpretation: Sinus rhythm Multiple premature complexes, vent & supraven Consider anterior infarct Confirmed by Rolan Bucco [phone]) on 01/18/2023 10:40:12 AM  Radiology No results found.  Procedures Procedures    Medications Ordered in ED  Medications - No data to display  ED Course/ Medical Decision Making/ A&P                                 Medical Decision Making Amount and/or Complexity of Data Reviewed Labs: ordered.   Patient presents requesting testing for methylamine.  She thinks she is being poisoned.  Labs reviewed and are nonconcerning.  Urine drug screen is negative.  Alcohol level is negative.  She appears to be appropriate in normal conversation.  She is oriented x 4.  She has no focal neurologic deficits.  On chart review I do not see any prior history of psych disease.  She had a recent head CT within the last month that was negative for headaches.  I discussed with her seeing the behavioral health specialist today and she declines.  She says that she will follow-up with her primary care doctor.  She does not have any indication for IVC.  Unclear if this is underlying psychiatric disease or if she actually does have a concern for being poisoned.  She does state that she has made several placed reports.  Regardless, she is currently asymptomatic and will follow-up with her primary care doctor.  She was discharged home in good condition.  Return precautions were given.  Final Clinical Impression(s) / ED Diagnoses Final diagnoses:  Weakness    Rx / DC Orders ED Discharge Orders     None         Rolan Bucco, MD 01/18/23 1301

## 2023-01-18 NOTE — ED Notes (Signed)
Discharge instructions reviewed with patient. Pt verbalized understanding and had no questions. Pt a&ox4, in no acute distress.

## 2023-01-20 DIAGNOSIS — C50811 Malignant neoplasm of overlapping sites of right female breast: Secondary | ICD-10-CM | POA: Diagnosis not present

## 2023-01-20 DIAGNOSIS — C50411 Malignant neoplasm of upper-outer quadrant of right female breast: Secondary | ICD-10-CM | POA: Diagnosis not present

## 2023-01-20 DIAGNOSIS — Z170 Estrogen receptor positive status [ER+]: Secondary | ICD-10-CM | POA: Diagnosis not present

## 2023-01-20 DIAGNOSIS — C50911 Malignant neoplasm of unspecified site of right female breast: Secondary | ICD-10-CM | POA: Diagnosis not present

## 2023-01-20 DIAGNOSIS — R928 Other abnormal and inconclusive findings on diagnostic imaging of breast: Secondary | ICD-10-CM | POA: Diagnosis not present

## 2023-01-20 DIAGNOSIS — R590 Localized enlarged lymph nodes: Secondary | ICD-10-CM | POA: Diagnosis not present

## 2023-01-21 ENCOUNTER — Telehealth: Payer: Self-pay | Admitting: Family Medicine

## 2023-01-21 NOTE — Transitions of Care (Post Inpatient/ED Visit) (Signed)
01/21/2023  Name: Bianca Medina MRN: [id number] DOB: 24-Aug-1950  Today's TOC FU Call Status: Today's TOC FU Call Status:: Successful TOC FU Call Completed TOC FU Call Complete Date: 01/21/23 Patient's Name and Date of Birth confirmed.  Transition Care Management Follow-up Telephone Call Date of Discharge: 01/18/23 Discharge Facility: [facility]) Type of Discharge: Emergency Department Reason for ED Visit: Other: (Exposure Concern) How have you been since you were released from the hospital?: Better Any questions or concerns?: No  Items Reviewed: Did you receive and understand the discharge instructions provided?: Yes Any new allergies since your discharge?: No Dietary orders reviewed?: NA Do you have support at home?: Yes People in Home: alone  Medications Reviewed Today: Medications Reviewed Today     Reviewed by Larey Dresser, RN (Registered Nurse) on 01/21/23 at 1524  Med List Status: <None>   Medication Order Taking? Sig Documenting Provider Last Dose Status Informant  acetaminophen (TYLENOL) 500 MG tablet [id number] No Take 500 mg by mouth every 6 (six) hours as needed for moderate pain. [provider] Taking Active   aspirin EC 81 MG tablet [id number] No Take 81 mg by mouth daily. Swallow whole. [provider] Taking Active Self, Pharmacy Records  atorvastatin (LIPITOR) 10 MG tablet [id number] No Take 1 tablet (10 mg total) by mouth daily. [name], MD Taking Active Self, Pharmacy Records  Biotin 5000 MCG CAPS [id number] No Take 5,000 mcg by mouth daily.  [provider] Taking Active Self, Pharmacy Records  celecoxib (CELEBREX) 200 MG capsule [id number] No Take 1 capsule (200 mg total) by mouth 2 (two) times daily between meals as needed. Kathryne Hitch, MD Taking Active   chlorthalidone (HYGROTON) 25 MG tablet [id number] No TAKE 1 TABLET (25 MG TOTAL) BY MOUTH DAILY. [name], MD Taking Active    diclofenac Sodium (VOLTAREN) 1 % GEL [id number] No Apply 2 g topically 4 (four) times daily. Kirtland Bouchard, PA-C Taking Active   fluticasone (FLONASE) 50 MCG/ACT nasal spray [id number]  Place 2 sprays into both nostrils daily. [name], MD  Active   losartan (COZAAR) 25 MG tablet [id number] No Take 25 mg by mouth daily. [provider] Taking Active Self, Pharmacy Records  metFORMIN (GLUCOPHAGE-XR) 500 MG 24 hr tablet [id number] No Take 1 tablet (500 mg total) by mouth 2 (two) times daily with a meal. [name], MD Taking Active   Potassium Chloride ER 20 MEQ TBCR [id number] No Take 1 tablet by mouth daily. [provider] Taking Active Self, Pharmacy Records  PROAIR HFA 108 [phone] Base) MCG/ACT inhaler [id number] No Inhale 1-2 puffs into the lungs every 4 (four) hours as needed for wheezing or shortness of breath.  [provider] Taking Active Self, Pharmacy Records  SYMBICORT 80-4.5 MCG/ACT inhaler [id number] No Inhale 2 puffs into the lungs 2 (two) times daily. [provider] Taking Active Self, Pharmacy Records  triamcinolone cream (KENALOG) 0.1 % [id number] No Apply topically 2 (two) times daily. [provider] Taking Active   Vitamin D, Ergocalciferol, (DRISDOL) 1.25 MG (50000 UNIT) CAPS capsule [id number] No TAKE 1 CAPSULE BY MOUTH ONE TIME PER WEEK [name], MD Taking Active   vitamin E 1000 UNIT capsule [id number] No Take 1,000 Units by mouth daily. [provider] Taking Active Self, Pharmacy Records            Home Care and Equipment/Supplies: Were Home Health Services Ordered?: NA Any new equipment or medical supplies  ordered?: NA  Functional Questionnaire: Do you need assistance with bathing/showering or dressing?: No Do you need assistance with meal preparation?: No Do you need assistance with eating?: No Do you have difficulty maintaining continence: No Do you need assistance with  getting out of bed/getting out of a chair/moving?: No Do you have difficulty managing or taking your medications?: No  Follow up appointments reviewed: PCP Follow-up appointment confirmed?: Yes Date of PCP follow-up appointment?: 01/21/23 Follow-up Provider: [facility] Follow-up appointment confirmed?: NA Do you need transportation to your follow-up appointment?: No Do you understand care options if your condition(s) worsen?: Yes-patient verbalized understanding    SIGNATURE Arvil Persons, BSN, RN

## 2023-01-21 NOTE — Telephone Encounter (Signed)
Pt has a hosp f/up with Dr Doreene Burke on 01/21/23 for weakness seen at [facility].

## 2023-01-22 ENCOUNTER — Ambulatory Visit (INDEPENDENT_AMBULATORY_CARE_PROVIDER_SITE_OTHER): Payer: Medicare PPO | Admitting: Family Medicine

## 2023-01-22 ENCOUNTER — Encounter: Payer: Self-pay | Admitting: Family Medicine

## 2023-01-22 VITALS — BP 124/68 | HR 70 | Temp 98.2°F | Ht 65.0 in | Wt 272.0 lb

## 2023-01-22 DIAGNOSIS — E118 Type 2 diabetes mellitus with unspecified complications: Secondary | ICD-10-CM

## 2023-01-22 DIAGNOSIS — F258 Other schizoaffective disorders: Secondary | ICD-10-CM

## 2023-01-22 DIAGNOSIS — I10 Essential (primary) hypertension: Secondary | ICD-10-CM

## 2023-01-22 DIAGNOSIS — E7800 Pure hypercholesterolemia, unspecified: Secondary | ICD-10-CM

## 2023-01-22 DIAGNOSIS — Z7984 Long term (current) use of oral hypoglycemic drugs: Secondary | ICD-10-CM | POA: Diagnosis not present

## 2023-01-22 LAB — HEMOGLOBIN A1C: Hgb A1c MFr Bld: 7.9 % — ABNORMAL HIGH (ref 4.6–6.5)

## 2023-01-22 LAB — LDL CHOLESTEROL, DIRECT: Direct LDL: 105 mg/dL

## 2023-01-22 NOTE — Progress Notes (Signed)
Established Patient Office Visit   Subjective:  Patient ID: Bianca Medina, female    DOB: 1950/10/05  Age: 72 y.o. MRN: [id number]  Chief Complaint  Patient presents with   Hospitalization Follow-up    Pt is here to be tested for poisoning.     HPI Encounter Diagnoses  Name Primary?   Other schizoaffective disorders (HCC) Yes   Type 2 diabetes mellitus with complication, without long-term current use of insulin (HCC)    Essential hypertension    Elevated LDL cholesterol level    For follow-up of above.  She claims compliance with her vitamin D and metformin.  She continues to be concerned that members of her church have been breaking into her house and have been attempting to poison her with methylamine.  Somehow these people have been able to gain access to her home without evidence of forced entry or damage to her home.  She would like to be tested for this.  This has been an ongoing concern.  She is repeatedly asked for testing for methylamine.  There has been no skin irritation nausea or vomiting.   Review of Systems  Constitutional: Negative.   HENT: Negative.    Eyes:  Negative for blurred vision, discharge and redness.  Respiratory: Negative.    Cardiovascular: Negative.   Gastrointestinal:  Negative for abdominal pain, nausea and vomiting.  Genitourinary: Negative.   Musculoskeletal: Negative.  Negative for myalgias.  Skin:  Negative for rash.  Neurological:  Negative for tingling, loss of consciousness and weakness.  Endo/Heme/Allergies:  Negative for polydipsia.     Current Outpatient Medications:    acetaminophen (TYLENOL) 500 MG tablet, Take 500 mg by mouth every 6 (six) hours as needed for moderate pain., Disp: , Rfl:    aspirin EC 81 MG tablet, Take 81 mg by mouth daily. Swallow whole., Disp: , Rfl:    atorvastatin (LIPITOR) 10 MG tablet, Take 1 tablet (10 mg total) by mouth daily., Disp: 90 tablet, Rfl: 1   Biotin 5000 MCG CAPS, Take 5,000 mcg by mouth  daily. , Disp: , Rfl:    celecoxib (CELEBREX) 200 MG capsule, Take 1 capsule (200 mg total) by mouth 2 (two) times daily between meals as needed., Disp: 60 capsule, Rfl: 3   chlorthalidone (HYGROTON) 25 MG tablet, TAKE 1 TABLET (25 MG TOTAL) BY MOUTH DAILY., Disp: 90 tablet, Rfl: 1   diclofenac Sodium (VOLTAREN) 1 % GEL, Apply 2 g topically 4 (four) times daily., Disp: 350 g, Rfl: 1   fluticasone (FLONASE) 50 MCG/ACT nasal spray, Place 2 sprays into both nostrils daily., Disp: 16 g, Rfl: 6   losartan (COZAAR) 25 MG tablet, Take 25 mg by mouth daily., Disp: , Rfl:    metFORMIN (GLUCOPHAGE-XR) 500 MG 24 hr tablet, Take 1 tablet (500 mg total) by mouth 2 (two) times daily with a meal., Disp: 180 tablet, Rfl: 1   Potassium Chloride ER 20 MEQ TBCR, Take 1 tablet by mouth daily., Disp: , Rfl:    PROAIR HFA 108 (90 Base) MCG/ACT inhaler, Inhale 1-2 puffs into the lungs every 4 (four) hours as needed for wheezing or shortness of breath. , Disp: , Rfl:    SYMBICORT 80-4.5 MCG/ACT inhaler, Inhale 2 puffs into the lungs 2 (two) times daily., Disp: , Rfl:    triamcinolone cream (KENALOG) 0.1 %, Apply topically 2 (two) times daily., Disp: , Rfl:    Vitamin D, Ergocalciferol, (DRISDOL) 1.25 MG (50000 UNIT) CAPS capsule, TAKE 1 CAPSULE BY  MOUTH ONE TIME PER WEEK, Disp: 12 capsule, Rfl: 2   vitamin E 1000 UNIT capsule, Take 1,000 Units by mouth daily., Disp: , Rfl:    Objective:     BP 124/68   Pulse 70   Temp 98.2 F (36.8 C)   Ht 5\' 5"  (1.651 m)   Wt 272 lb (123.4 kg)   SpO2 96%   BMI 45.26 kg/m    Physical Exam Constitutional:      General: She is not in acute distress.    Appearance: Normal appearance. She is not ill-appearing, toxic-appearing or diaphoretic.  HENT:     Head: Normocephalic and atraumatic.     Right Ear: External ear normal.     Left Ear: External ear normal.  Eyes:     General: No scleral icterus.       Right eye: No discharge.        Left eye: No discharge.      Extraocular Movements: Extraocular movements intact.     Conjunctiva/sclera: Conjunctivae normal.  Pulmonary:     Effort: Pulmonary effort is normal. No respiratory distress.  Skin:    General: Skin is warm and dry.  Neurological:     Mental Status: She is alert and oriented to person, place, and time.  Psychiatric:        Mood and Affect: Mood normal.        Behavior: Behavior normal.      No results found for any visits on 01/22/23.    The 10-year ASCVD risk score (Arnett DK, et al., 2019) is: 20.7%    Assessment & Plan:   Other schizoaffective disorders (HCC) -     Ambulatory referral to Psychiatry  Type 2 diabetes mellitus with complication, without long-term current use of insulin (HCC) -     Hemoglobin A1c  Essential hypertension  Elevated LDL cholesterol level -     LDL cholesterol, direct    Return in about 3 months (around 04/23/2023).  Explained that methylamine is used in the production of concrete  And has a foul smell.  Was unlikely to be used for poisoning.  It can cause a skin rash and stomach upset.  [name], MD

## 2023-01-26 DIAGNOSIS — E531 Pyridoxine deficiency: Secondary | ICD-10-CM | POA: Diagnosis not present

## 2023-01-26 DIAGNOSIS — R262 Difficulty in walking, not elsewhere classified: Secondary | ICD-10-CM | POA: Diagnosis not present

## 2023-01-26 DIAGNOSIS — M25561 Pain in right knee: Secondary | ICD-10-CM | POA: Diagnosis not present

## 2023-01-26 DIAGNOSIS — M79601 Pain in right arm: Secondary | ICD-10-CM | POA: Diagnosis not present

## 2023-01-27 ENCOUNTER — Telehealth: Payer: Self-pay

## 2023-01-27 ENCOUNTER — Telehealth: Payer: Self-pay | Admitting: Family Medicine

## 2023-01-27 NOTE — Telephone Encounter (Signed)
Pt called and said she missed your call. Please call her back

## 2023-01-27 NOTE — Telephone Encounter (Signed)
LVM with results. Asked pt to call the office and let us know if she is taking atorvastatin and metformin as prescribed.

## 2023-01-28 ENCOUNTER — Ambulatory Visit: Payer: Medicare PPO | Admitting: Family Medicine

## 2023-01-28 DIAGNOSIS — H608X3 Other otitis externa, bilateral: Secondary | ICD-10-CM | POA: Diagnosis not present

## 2023-02-08 ENCOUNTER — Encounter: Payer: Self-pay | Admitting: Neurology

## 2023-02-08 ENCOUNTER — Telehealth: Payer: Self-pay | Admitting: Family Medicine

## 2023-02-08 ENCOUNTER — Ambulatory Visit: Payer: Medicare PPO | Admitting: Neurology

## 2023-02-08 VITALS — BP 122/62 | HR 66 | Ht 65.0 in | Wt 268.0 lb

## 2023-02-08 DIAGNOSIS — G478 Other sleep disorders: Secondary | ICD-10-CM | POA: Diagnosis not present

## 2023-02-08 DIAGNOSIS — E1100 Type 2 diabetes mellitus with hyperosmolarity without nonketotic hyperglycemic-hyperosmolar coma (NKHHC): Secondary | ICD-10-CM | POA: Diagnosis not present

## 2023-02-08 DIAGNOSIS — G4710 Hypersomnia, unspecified: Secondary | ICD-10-CM | POA: Diagnosis not present

## 2023-02-08 DIAGNOSIS — Z7984 Long term (current) use of oral hypoglycemic drugs: Secondary | ICD-10-CM | POA: Diagnosis not present

## 2023-02-08 NOTE — Patient Instructions (Signed)
Please remember to try to maintain good sleep hygiene, which means: Keep a regular sleep and wake schedule, try not to exercise or have a meal within 2 hours of your bedtime, try to keep your bedroom conducive for sleep, that is, cool and dark, without light distractors such as an illuminated alarm clock, and refrain from watching TV right before sleep or in the middle of the night and do not keep the TV or radio on during the night. Also, try not to use or play on electronic devices at bedtime, such as your cell phone, tablet PC or laptop. If you like to read at bedtime on an electronic device, try to dim the background light as much as possible. Do not eat in the middle of the night.   We will request a sleep study.    We will look for leg twitching and snoring or sleep apnea.   For chronic insomnia, you are best followed by a psychiatrist and/or sleep psychologist.   We will call you with the sleep study results and make a follow up appointment if needed.     Insomnia Insomnia is a sleep disorder that makes it difficult to fall asleep or stay asleep. Insomnia can cause fatigue, low energy, difficulty concentrating, mood swings, and poor performance at work or school. There are three different ways to classify insomnia: Difficulty falling asleep. Difficulty staying asleep. Waking up too early in the morning. Any type of insomnia can be long-term (chronic) or short-term (acute). Both are common. Short-term insomnia usually lasts for 3 months or less. Chronic insomnia occurs at least three times a week for longer than 3 months. What are the causes? Insomnia may be caused by another condition, situation, or substance, such as: Having certain mental health conditions, such as anxiety and depression. Using caffeine, alcohol, tobacco, or drugs. Having gastrointestinal conditions, such as gastroesophageal reflux disease (GERD). Having certain medical conditions. These  include: Asthma. Alzheimer's disease. Stroke. Chronic pain. An overactive thyroid gland (hyperthyroidism). Other sleep disorders, such as restless legs syndrome and sleep apnea. Menopause. Sometimes, the cause of insomnia may not be known. What increases the risk? Risk factors for insomnia include: Gender. Females are affected more often than males. Age. Insomnia is more common as people get older. Stress and certain medical and mental health conditions. Lack of exercise. Having an irregular work schedule. This may include working night shifts and traveling between different time zones. What are the signs or symptoms? If you have insomnia, the main symptom is having trouble falling asleep or having trouble staying asleep. This may lead to other symptoms, such as: Feeling tired or having low energy. Feeling nervous about going to sleep. Not feeling rested in the morning. Having trouble concentrating. Feeling irritable, anxious, or depressed. How is this diagnosed? This condition may be diagnosed based on: Your symptoms and medical history. Your health care provider may ask about: Your sleep habits. Any medical conditions you have. Your mental health. A physical exam. How is this treated? Treatment for insomnia depends on the cause. Treatment may focus on treating an underlying condition that is causing the insomnia. Treatment may also include: Medicines to help you sleep. Counseling or therapy. Lifestyle adjustments to help you sleep better. Follow these instructions at home: Eating and drinking  Limit or avoid alcohol, caffeinated beverages, and products that contain nicotine and tobacco, especially close to bedtime. These can disrupt your sleep. Do not eat a large meal or eat spicy foods right before bedtime. This can  lead to digestive discomfort that can make it hard for you to sleep. Sleep habits  Keep a sleep diary to help you and your health care provider figure out  what could be causing your insomnia. Write down: When you sleep. When you wake up during the night. How well you sleep and how rested you feel the next day. Any side effects of medicines you are taking. What you eat and drink. Make your bedroom a dark, comfortable place where it is easy to fall asleep. Put up shades or blackout curtains to block light from outside. Use a white noise machine to block noise. Keep the temperature cool. Limit screen use before bedtime. This includes: Not watching TV. Not using your smartphone, tablet, or computer. Stick to a routine that includes going to bed and waking up at the same times every day and night. This can help you fall asleep faster. Consider making a quiet activity, such as reading, part of your nighttime routine. Try to avoid taking naps during the day so that you sleep better at night. Get out of bed if you are still awake after 15 minutes of trying to sleep. Keep the lights down, but try reading or doing a quiet activity. When you feel sleepy, go back to bed. General instructions Take over-the-counter and prescription medicines only as told by your health care provider. Exercise regularly as told by your health care provider. However, avoid exercising in the hours right before bedtime. Use relaxation techniques to manage stress. Ask your health care provider to suggest some techniques that may work well for you. These may include: Breathing exercises. Routines to release muscle tension. Visualizing peaceful scenes. Make sure that you drive carefully. Do not drive if you feel very sleepy. Keep all follow-up visits. This is important. Contact a health care provider if: You are tired throughout the day. You have trouble in your daily routine due to sleepiness. You continue to have sleep problems, or your sleep problems get worse. Get help right away if: You have thoughts about hurting yourself or someone else. Get help right away if you  feel like you may hurt yourself or others, or have thoughts about taking your own life. Go to your nearest emergency room or: Call 911. Call the National Suicide Prevention Lifeline at (647)684-1198 or 988. This is open 24 hours a day. Text the Crisis Text Line at 913-628-7798. Summary Insomnia is a sleep disorder that makes it difficult to fall asleep or stay asleep. Insomnia can be long-term (chronic) or short-term (acute). Treatment for insomnia depends on the cause. Treatment may focus on treating an underlying condition that is causing the insomnia. Keep a sleep diary to help you and your health care provider figure out what could be causing your insomnia. This information is not intended to replace advice given to you by your health care provider. Make sure you discuss any questions you have with your health care provider. Document Revised: 04/14/2021 Document Reviewed: 04/14/2021 Elsevier Patient Education  2024 [employer] Hypersomnia Hypersomnia is a condition in which a person feels very tired during the day even though the person gets plenty of sleep at night. A person with this condition may take naps during the day and may find it very difficult to wake up from sleep. Hypersomnia may affect a person's ability to think, concentrate, drive, or remember things. What are the causes? The cause of this condition may not be known. Possible causes include: Taking certain medicines. Using drugs or alcohol.  Sleep disorders, such as narcolepsy and sleep apnea. Injury to the head, brain, or spinal cord. Tumors. Certain medical conditions. These include: Depression. Diabetes. Gastroesophageal reflux disease (GERD). An underactive thyroid gland (hypothyroidism). What are the signs or symptoms? The main symptoms of hypersomnia include: Feeling very tired throughout the day, regardless of how much sleep you got the night before. Having trouble waking up. Others may find it difficult to wake  you up when you are sleeping. Sleeping for longer and longer periods at a time. Taking naps throughout the day. Other symptoms may include: Feeling restless, anxious, or annoyed. Lacking energy. Having trouble with: Remembering. Speaking. Thinking. Loss of appetite. Seeing, hearing, tasting, smelling, or feeling things that are not real (hallucinations). How is this diagnosed? This condition may be diagnosed based on: Your symptoms and medical history. Your sleeping habits. Your health care provider may ask you to write down your sleeping habits in a daily sleep log, along with any symptoms you have. A series of tests that are done while you sleep (sleep study or polysomnogram). A test that measures how quickly you can fall asleep during the day (daytime nap study or multiple sleep latency test). How is this treated? This condition may be treated by: Following a regular sleep routine. Making lifestyle changes, such as changing your eating habits, getting regular exercise, and avoiding alcohol or caffeinated beverages. Taking medicines to make you more alert (stimulants) during the day. Treating any underlying medical causes of hypersomnia. Follow these instructions at home: Sleep habits Stick to a routine that includes going to bed and waking up at the same times every day and night. Practice a relaxing bedtime routine. This may include reading, meditation, deep breathing, or taking a warm bath before going to sleep. Exercise regularly as told by your health care provider. However, avoid exercising in the hours right before bedtime. Keep your sleep environment at a cooler temperature, darkened, and quiet. Sleep with pillows and a mattress that are comfortable and supportive. Schedule short 20-minute naps for when you feel sleepiest during the day. Talk with your employer or teachers about your hypersomnia. If possible, adjust your schedule so that: You have a regular daytime work  schedule. You can take a scheduled nap during the day. You do not have to work or be active at night. Do not eat a heavy meal for a few hours before bedtime. Eat your meals at about the same times every day. Safety  Do not drive or use machinery if you are sleepy. Ask your health care provider if it is safe for you to drive. Wear a life jacket when swimming or spending time near water. General instructions  Take over-the-counter and prescription medicines only as told by your health care provider. This includes supplements. Avoid drinking alcohol or caffeinated beverages. Keep a sleep log that will help your health care provider manage your condition. This may include information about: What time you go to bed each night. How often you wake up at night. How many hours you sleep at night. How often and for how long you nap during the day. Any observations from others, such as leg movements during sleep, sleep walking, or snoring. Keep all follow-up visits. This is important. Contact a health care provider if: You have new symptoms. Your symptoms get worse. Get help right away if: You have thoughts about hurting yourself or someone else. Get help right away if you feel like you may hurt yourself or others, or have thoughts about  taking your own life. Go to your nearest emergency room or: Call 911. Call the National Suicide Prevention Lifeline at 918-014-6810 or 988. This is open 24 hours a day. Text the Crisis Text Line at 9896523551. Summary Hypersomnia refers to a condition in which you feel very tired during the day even though you get plenty of sleep at night. A person with this condition may take naps during the day and may find it very difficult to wake up from sleep. Hypersomnia may affect a person's ability to think, concentrate, drive, or remember things. Treatment may include a regular sleep routine and making some lifestyle changes. This information is not intended to replace  advice given to you by your health care provider. Make sure you discuss any questions you have with your health care provider. Document Revised: 04/14/2021 Document Reviewed: 04/14/2021 Elsevier Patient Education  2024 [employer].

## 2023-02-08 NOTE — Telephone Encounter (Signed)
Pt missed call and would like a cb. Please advise pt at [phone]

## 2023-02-08 NOTE — Progress Notes (Addendum)
SLEEP MEDICINE CLINIC    Provider:  Melvyn Novas, MD  Primary Care Physician:  [name], MD [address] [place] [address]     Referring Provider: [name], Md [address] [place],  [place] [address]          Chief Complaint according to patient   Patient presents with:     Former Dr Anne Hahn Patient (Re-Visit). Has still not undergone sleep testing.            HISTORY OF PRESENT ILLNESS:  Bianca Medina is a 72 y.o. female patient who is seen upon referral on 02/08/2023 from dr. Doreene Burke , PCP ,  for a new sleep consultation. .  Chief concern according to patient :  Other schizoaffective disorders (HCC) Yes    Type 2 diabetes mellitus with complication, without long-term current use of insulin (HCC)     Essential hypertension     Elevated LDL cholesterol level      For follow-up of above.  She claims compliance with her vitamin D and metformin.  She continues to be concerned that members of her church have been breaking into her house and have been attempting to poison her with methylamine.  Somehow these people have been able to gain access to her home without evidence of forced entry or damage to her home.  She would like to be tested for this.  This has been an ongoing concern.  She is repeatedly asked for testing for methylamine.  There has been no skin irritation nausea or vomiting.     I have the pleasure of seeing Bianca Medina on 02/08/23 .  She reports being here for a sleep evaluation.  She has doubts about her breast cancer dx and wants to look for a second opinion.  She has had a hysterectomy, She was seen in the ED 3/ 2024, had trouble walking, right hand and arm pain, has now been in PT. She was diagnosed with carpal tunnel by EMG and NCV,  11-25-2022. She has been given a brace  but misplaced it- not received other treatment.  She reports onset of symptoms in 2022.   She was recently seen in the ED but her  symptoms from thereon have resolved. Bianca Medina is a 72 y.o. female.   Patient is a 72 year old female with a history of GERD, hypertension, hyperlipidemia, diabetes who presents with concerns for a toxic exposure.  She says that she is concerned that she is being poisoned by methylamine.  She reports that she had an exposure to this substance from his food source in 2020.  She is concerned that she is being poisoned.  She says that she thinks people are breaking into her house and she is noticing that things are missing and then will come back again such as her toothpaste and thinks that it is being laced with something.  She has some generalized weakness.  She denies any other recent illnesses.  No cough or cold symptoms.  No vomiting or diarrhea.  No fevers.  She denies any known psych history other than anxiety.  She says she is feeling anxious because she feels these people are breaking into her house.  She thinks there are people from her church.  She denies any hallucinations.          The patient had no previous sleep study at [facility] sleep, she was tested by PSG at [facility] sleep  center 8 years ago. Marland Kitchen Her sleep related history is : Obesity, carpal tunnel, needing a nap daily, non- restorative sleep,  snoring, dry mouth, no headaches. No ENT surgery, no Tonsillectomy, cervical spine injury or TBI. Her problem is INSOMNIA, she stated.    Family medical /sleep history: sister with insomnia.     Social history:  Patient is retired from IT trainer, from [employer], Theatre manager.  She lives in a household alone. Family status is divorced , has 3 adult children, has 6 grandchildren. No pets.  Tobacco use; none .  ETOH use ; none ,  Caffeine intake in form of Coffee( /) Soda( 1 every 2 days ) Tea ( every other day ) no energy drinks Exercise in form of walking .   Hobbies : arts and crafts.       Sleep consult from Dr Anne Hahn, resulting in a transfer of neurological  care . Bianca Medina is a 72 y.o. female with PMH of asthma, DM, hypertension, hyperlipidemia presents to urgent care today with complaints of leg swelling and feeling short of breath.  Patient states she is actually feeling much better today but wanted to be evaluated.  Patient describes dyspnea on exertion x1 week.  She was able to walk country park yesterday and only became out of breath on the hills.  Bilateral lower extremity swelling noted over the past 5 to 6 days now somewhat improved without intervention.  She does not monitor her weight.  Patient denies any recent fever or chills, headache, chest pain, orthopnea.  Patient does state she stopped taking some of her medications in December as she felt walking with helping her feel better and she was on too many medications.  She has not been able to walk as much due to cold weather.  Patient was also seen by her cardiologist last week who recommended possible echocardiogram if swelling became worse      HISTORY OF PRESENT ILLNESS:  Bianca Medina is a 72 y.o. AA female patient and seen here as a referral on 01/27/2021 from Dr Anne Hahn for a sleep consultation , in relation to Headache .  Chief concern according to patient :  ' only sometimes do I wake with a headache, not severe, and once I take tylenol its goes away immediately. There is no nausea, no photophobia and no focal point that hurts. "   I have the pleasure of seeing Bianca Medina today, a right -handed Black or [place] American female with a possible sleep disorder.  She   has a past medical history of Anemia (2003), Asthma, Common migraine (06/23/2019), Diabetes mellitus without complication (HCC), GERD (gastroesophageal reflux disease), Hyperlipidemia, mild, Hypertension, NSVD (normal spontaneous vaginal delivery), PMDD (premenstrual dysphoric disorder), morbid obesity. Vertigo recently, tinnitus- on meclizine.  Mrs. Meersman had undergone a sleep study on December 12, 2013 performed at [facility] health systems, under the guidance of Dr. Jetty Duhamel.  She had a really poor sleep efficiency the total recording time was 412 minutes and the total sleep time 174 minutes so this was 42.3% indicating insomnia.  For the time that he actually slept she had an AHI of 11/h these were all hypopneas no frank apneas were seen.  She was described as snoring.  All hypopneas clustered in rem sleep.  This is most likely a manifestation of obesity hypoventilation and her lowest oxygen saturation was 80% SPO2 she spent 1.8 minutes with a oxygen saturation less than 88%.  She remained on room air at the course her apnea was so mild she was not split into a CPAP titration at night.  Her neurologic exam was entirely normal.  She carried a diagnosis of allergic rhinitis insulin resistance, perimenstrual mood disorder and morbid obesity.     Today's Epworth sleepiness score was endorsed at only 5 points out of 24 points in stark contrast to her Epworth score at the time of her sleep study from 7 years ago.  Then she was excessively daytime sleepy but she also attributed this to stress at her job.   She has since retired. Her blood pressure has been better controlled, but was still need to medicate.  PHQ indicated still depression to be present .     Review of Systems: Out of a complete 14 system review, the patient complains of only the following symptoms, and all other reviewed systems are negative.:  Fatigue, sleepiness , snoring, fragmented sleep, Insomnia, Nocturia    How likely are you to doze in the following situations: 0 = not likely, 1 = slight chance, 2 = moderate chance, 3 = high chance   Sitting and Reading? Watching Television? Sitting inactive in a public place (theater or meeting)? As a passenger in a car for an hour without a break? Lying down in the afternoon when circumstances permit? Sitting and talking to someone? Sitting quietly after lunch without alcohol? In a car, while stopped  for a few minutes in traffic?   Total = 12/ 24 points   FSS endorsed at 28/ 63 points.   GDS : denies depression.   Social History   Socioeconomic History   Marital status: Divorced    Spouse name: Not on file   Number of children: 3   Years of education: college   Highest education level: Not on file  Occupational History   Occupation: Interior and spatial designer: [employer]  Tobacco Use   Smoking status: Never   Smokeless tobacco: Never  Vaping Use   Vaping status: Never Used  Substance and Sexual Activity   Alcohol use: Not Currently   Drug use: Never   Sexual activity: Not Currently    Partners: Male    Birth control/protection: Surgical, Abstinence    Comment: HYSTERECTOMY-1st intercourse 17 yo-5 partners  Other Topics Concern   Not on file  Social History Narrative   Lives alone   Right handed     Caffeine use: soda/tea couple times a week   Social Determinants of Health   Financial Resource Strain: Low Risk  (03/31/2022)   Overall Financial Resource Strain (CARDIA)    Difficulty of Paying Living Expenses: Not hard at all  Food Insecurity: No Food Insecurity (04/27/2022)   Hunger Vital Sign    Worried About Running Out of Food in the Last Year: Never true    Ran Out of Food in the Last Year: Never true  Transportation Needs: No Transportation Needs (04/27/2022)   PRAPARE - Administrator, Civil Service (Medical): No    Lack of Transportation (Non-Medical): No  Physical Activity: Inactive (03/31/2022)   Exercise Vital Sign    Days of Exercise per Week: 0 days    Minutes of Exercise per Session: 0 min  Stress: No Stress Concern Present (03/31/2022)   [employer] of Occupational Health - Occupational Stress Questionnaire    Feeling of Stress : Not at all  Social Connections: Unknown (09/16/2021)   Received from [facility],  [facility]   Social Network    Social Network: Not on file    Family  History  Problem Relation Age of Onset   Cancer Mother        LUNG- SMOKER   Diabetes Father    Hypertension Father    Heart disease Father    Stroke Father    Breast cancer Sister        lates 53s   Cancer Paternal Aunt        COLON   Cancer Paternal Uncle        COLON    Past Medical History:  Diagnosis Date   Adenomatous colon polyp 2021   Anemia 2003   IRON DEFICIENCY    Asthma    Breast cancer (HCC)    right breast, dx in 11-2022.   Chronic anal fissure    Common migraine 06/23/2019   Diabetes mellitus without complication (HCC) Type 2, 2014     GERD (gastroesophageal reflux disease)    Hearing loss    Hyperlipidemia, mild    Hypertension        NSVD (normal spontaneous vaginal delivery)    X3   Obesity    PMDD (premenstrual dysphoric disorder)    Sensorineural hearing loss (SNHL) of both ears    Sleep disorder     Past Surgical History:  Procedure Laterality Date   ABDOMINAL HYSTERECTOMY     BLADDER REPAIR     BREAST BIOPSY Right    2023   COLONOSCOPY  11/2019   1 hpp and 1 tubl adenoma   COLONOSCOPY WITH PROPOFOL N/A 12/03/2021   Procedure: COLONOSCOPY WITH PROPOFOL;  Surgeon: Earline Mayotte, MD;  Location: [facility] ENDOSCOPY;  Service: Endoscopy;  Laterality: N/A;   GUM SURGERY  1980's   MANDIBLE FRACTURE SURGERY     TUBAL LIGATION       Current Outpatient Medications on File Prior to Visit  Medication Sig Dispense Refill   acetaminophen (TYLENOL) 500 MG tablet Take 500 mg by mouth every 6 (six) hours as needed for moderate pain.     aspirin EC 81 MG tablet Take 81 mg by mouth daily. Swallow whole.     atorvastatin (LIPITOR) 10 MG tablet Take 1 tablet (10 mg total) by mouth daily. 90 tablet 1   Biotin 5000 MCG CAPS Take 5,000 mcg by mouth daily.      celecoxib (CELEBREX) 200 MG capsule Take 1 capsule (200 mg total) by mouth 2 (two) times daily between meals as needed. 60 capsule 3   chlorthalidone (HYGROTON) 25 MG tablet TAKE 1 TABLET (25 MG TOTAL)  BY MOUTH DAILY. 90 tablet 1   diclofenac Sodium (VOLTAREN) 1 % GEL Apply 2 g topically 4 (four) times daily. 350 g 1   fluticasone (FLONASE) 50 MCG/ACT nasal spray Place 2 sprays into both nostrils daily. 16 g 6   losartan (COZAAR) 25 MG tablet Take 25 mg by mouth daily.     metFORMIN (GLUCOPHAGE-XR) 500 MG 24 hr tablet Take 1 tablet (500 mg total) by mouth 2 (two) times daily with a meal. 180 tablet 1   Potassium Chloride ER 20 MEQ TBCR Take 1 tablet by mouth daily.     PROAIR HFA 108 (90 Base) MCG/ACT inhaler Inhale 1-2 puffs into the lungs every 4 (four) hours as needed for wheezing or shortness of breath.      SYMBICORT 80-4.5 MCG/ACT inhaler Inhale 2 puffs into the lungs 2 (two) times daily.  triamcinolone cream (KENALOG) 0.1 % Apply topically 2 (two) times daily.     Vitamin D, Ergocalciferol, (DRISDOL) 1.25 MG (50000 UNIT) CAPS capsule TAKE 1 CAPSULE BY MOUTH ONE TIME PER WEEK 12 capsule 2   vitamin E 1000 UNIT capsule Take 1,000 Units by mouth daily.     No current facility-administered medications on file prior to visit.    Allergies  Allergen Reactions   Codeine Other (See Comments)    nightmares     DIAGNOSTIC DATA (LABS, IMAGING, TESTING) - I reviewed patient records, labs, notes, testing and imaging myself where available.  Lab Results  Component Value Date   WBC 10.2 01/18/2023   HGB 12.7 01/18/2023   HCT 37.8 01/18/2023   MCV 75.9 (L) 01/18/2023   PLT 287 01/18/2023      Component Value Date/Time   NA 136 01/18/2023 0949   NA 141 08/04/2021 1509   K 3.8 01/18/2023 0949   CL 97 (L) 01/18/2023 0949   CO2 31 01/18/2023 0949   GLUCOSE 145 (H) 01/18/2023 0949   BUN 13 01/18/2023 0949   BUN 22 08/04/2021 1509   CREATININE 0.80 01/18/2023 0949   CALCIUM 9.4 01/18/2023 0949   PROT 6.9 01/18/2023 0949   PROT 6.8 08/04/2021 1509   ALBUMIN 3.3 (L) 01/18/2023 0949   ALBUMIN 3.9 08/04/2021 1509   AST 19 01/18/2023 0949   ALT 19 01/18/2023 0949   ALKPHOS 82  01/18/2023 0949   BILITOT 1.0 01/18/2023 0949   BILITOT 0.2 08/04/2021 1509   GFRNONAA >60 01/18/2023 0949   GFRAA >60 11/20/2019 0036   Lab Results  Component Value Date   CHOL 163 03/19/2021   HDL 42.70 03/19/2021   LDLCALC 105 (H) 03/19/2021   LDLDIRECT 105.0 01/22/2023   TRIG 75.0 03/19/2021   CHOLHDL 4 03/19/2021   Lab Results  Component Value Date   HGBA1C 7.9 (H) 01/22/2023   Lab Results  Component Value Date   VITAMINB12 556 08/07/2022   Lab Results  Component Value Date   TSH 0.34 (L) 09/02/2022    PHYSICAL EXAM:  Height 5.5 and 269 pounds.    Wt Readings from Last 3 Encounters:  01/22/23 272 lb (123.4 kg)  12/24/22 270 lb (122.5 kg)  12/19/22 265 lb (120.2 kg)     Ht Readings from Last 3 Encounters:  01/22/23 5\' 5"  (1.651 m)  12/24/22 5\' 5"  (1.651 m)  12/19/22 5\' 5"  (1.651 m)      General: The patient is awake, alert and appears not in acute distress. The patient is well groomed. Head: Normocephalic, atraumatic. Neck is supple.  Mallampati 3 plus ,  neck circumference:16 inches.  Nasal airflow patent.  Retrognathia is seen.  Dental status: biological  Cardiovascular:  Regular rate and cardiac rhythm by pulse,  without distended neck veins. Respiratory: Lungs are clear to auscultation.  Skin:  With evidence of ankle edema, non-pitting.  Trunk: The patient's posture is erect.   NEUROLOGIC EXAM: The patient is awake and alert, oriented to place and time.   Memory subjective described as intact.  Attention span & concentration ability appears normal.  Speech is fluent,  without  dysarthria, dysphonia or aphasia.  Mood and affect are appropriate.   Cranial nerves: no loss of smell or taste reported  Pupils are equal and briskly reactive to light. Funduscopic exam deferred.  Extraocular movements in vertical and horizontal planes were intact and without nystagmus. No Diplopia. Visual fields by finger perimetry are intact. Hearing was intact  to  soft voice and finger rubbing.    Facial sensation intact to fine touch.  Facial motor strength is symmetric and tongue and uvula move midline.  Neck ROM : rotation, tilt and flexion extension were normal for age and shoulder shrug was symmetrical.    Motor exam:  Symmetric bulk, tone and ROM.  tightness feeling in both ankles.  Normal tone without cog -wheeling, asymmetric grip strength - weaker on the right. .   Sensory:  Fine touch, and vibration were tested and normal.  Proprioception tested in the upper extremities was normal.    DTR were symmetric. Gait was deferred.   ASSESSMENT AND PLAN 72 y.o. year old female  here with:    1) This patient has been seen in our sleep clinic last year . Unclear what let to this RV and presumed new sleep referral. She has stated she is not interested in sleep testing or treatment of organic sleep disorders. She only reluctantly agreed to undergo HST ,  Higher risk for OSA based on BMI, Mallampati and non restorative sleep history, EDS - and naps up to 2 hours daily.   2)Insomnia is in 90% of sleep patient caused by a Psychological problem, (anxiety, depression, high stress) or sleep hygiene deficiency.It is not treated in this clinic - her anxiety will need to be addressed by mental health provider.   3) PCP Dr. Doreene Burke is addressing weight  as main risk for OSA and many other disorders. She was previously referred to weight and wellness and has not followed up.  Her DM and her neuropathy/ carpal tunnel are closely related.  I recommend to purchase a soft wrist brace, discuss with main Primary Care provider.    I ordered a HST.   I plan  only to follow up  through our NP within 3  months post HST  testing.  I do not intent to offer more appointments if no sleep testing is wanted.   I would like to thank  [name], Md [address] [place],  [place] [address] for allowing me to meet with this patient. I see no role for me in  her further care unless she allows ruling out any organic sleep disorders.     After spending a total time of  34  minutes face to face and additional time for physical and neurologic examination, review of laboratory studies,  personal review of imaging studies, reports and results of other testing and review of referral information / records as far as provided in visit,   Electronically signed by: Melvyn Novas, MD 02/08/2023 2:11 PM  [facility] Board certified by The [employer] of Sleep Medicine and Diplomate of the [employer] of Sleep Medicine. Board certified In Neurology through the ABPN, Fellow of the [employer] of Neurology.

## 2023-02-09 ENCOUNTER — Ambulatory Visit: Payer: Medicare PPO | Admitting: Family Medicine

## 2023-02-09 ENCOUNTER — Encounter: Payer: Self-pay | Admitting: Family Medicine

## 2023-02-09 VITALS — BP 138/76 | HR 62 | Temp 97.9°F | Ht 65.0 in | Wt 270.4 lb

## 2023-02-09 DIAGNOSIS — R928 Other abnormal and inconclusive findings on diagnostic imaging of breast: Secondary | ICD-10-CM | POA: Diagnosis not present

## 2023-02-09 DIAGNOSIS — R262 Difficulty in walking, not elsewhere classified: Secondary | ICD-10-CM | POA: Diagnosis not present

## 2023-02-09 DIAGNOSIS — M79601 Pain in right arm: Secondary | ICD-10-CM | POA: Diagnosis not present

## 2023-02-09 DIAGNOSIS — M25561 Pain in right knee: Secondary | ICD-10-CM | POA: Diagnosis not present

## 2023-02-09 DIAGNOSIS — R531 Weakness: Secondary | ICD-10-CM | POA: Diagnosis not present

## 2023-02-09 NOTE — Progress Notes (Signed)
Established Patient Office Visit   Subjective:  Patient ID: Bianca Medina, female    DOB: [birth year]  Age: 72 y.o. MRN: [id number]  Chief Complaint  Patient presents with   Referral    Pt requesting referral to another oncology.     HPI Encounter Diagnoses  Name Primary?   Abnormal mammogram Yes   For follow-up of an abnormal mammogram.  Lumpectomy and sentinel node biopsy were scheduled.  Patient canceled recommended surgery and would like to see an oncological surgeon.   Review of Systems  Constitutional: Negative.   HENT: Negative.    Eyes:  Negative for blurred vision, discharge and redness.  Respiratory: Negative.    Cardiovascular: Negative.   Gastrointestinal:  Negative for abdominal pain.  Genitourinary: Negative.   Musculoskeletal: Negative.  Negative for myalgias.  Skin:  Negative for rash.  Neurological:  Negative for tingling, loss of consciousness and weakness.  Endo/Heme/Allergies:  Negative for polydipsia.     Current Outpatient Medications:    acetaminophen (TYLENOL) 500 MG tablet, Take 500 mg by mouth every 6 (six) hours as needed for moderate pain., Disp: , Rfl:    aspirin EC 81 MG tablet, Take 81 mg by mouth daily. Swallow whole., Disp: , Rfl:    atorvastatin (LIPITOR) 10 MG tablet, Take 1 tablet (10 mg total) by mouth daily., Disp: 90 tablet, Rfl: 1   Biotin 5000 MCG CAPS, Take 5,000 mcg by mouth daily. , Disp: , Rfl:    celecoxib (CELEBREX) 200 MG capsule, Take 1 capsule (200 mg total) by mouth 2 (two) times daily between meals as needed., Disp: 60 capsule, Rfl: 3   chlorthalidone (HYGROTON) 25 MG tablet, TAKE 1 TABLET (25 MG TOTAL) BY MOUTH DAILY., Disp: 90 tablet, Rfl: 1   diclofenac Sodium (VOLTAREN) 1 % GEL, Apply 2 g topically 4 (four) times daily., Disp: 350 g, Rfl: 1   fluticasone (FLONASE) 50 MCG/ACT nasal spray, Place 2 sprays into both nostrils daily., Disp: 16 g, Rfl: 6   losartan (COZAAR) 25 MG tablet, Take 25 mg by mouth daily., Disp: ,  Rfl:    metFORMIN (GLUCOPHAGE-XR) 500 MG 24 hr tablet, Take 1 tablet (500 mg total) by mouth 2 (two) times daily with a meal., Disp: 180 tablet, Rfl: 1   Potassium Chloride ER 20 MEQ TBCR, Take 1 tablet by mouth daily., Disp: , Rfl:    PROAIR HFA 108 (90 Base) MCG/ACT inhaler, Inhale 1-2 puffs into the lungs every 4 (four) hours as needed for wheezing or shortness of breath. , Disp: , Rfl:    SYMBICORT 80-4.5 MCG/ACT inhaler, Inhale 2 puffs into the lungs 2 (two) times daily., Disp: , Rfl:    triamcinolone cream (KENALOG) 0.1 %, Apply topically 2 (two) times daily., Disp: , Rfl:    Vitamin D, Ergocalciferol, (DRISDOL) 1.25 MG (50000 UNIT) CAPS capsule, TAKE 1 CAPSULE BY MOUTH ONE TIME PER WEEK, Disp: 12 capsule, Rfl: 2   vitamin E 1000 UNIT capsule, Take 1,000 Units by mouth daily., Disp: , Rfl:    Objective:     BP 138/76   Pulse 62   Temp 97.9 F (36.6 C)   Ht 5\' 5"  (1.651 m)   Wt 270 lb 6.4 oz (122.7 kg)   SpO2 94%   BMI 45.00 kg/m    Physical Exam Constitutional:      General: She is not in acute distress.    Appearance: Normal appearance. She is not ill-appearing, toxic-appearing or diaphoretic.  HENT:  Head: Normocephalic and atraumatic.     Right Ear: External ear normal.     Left Ear: External ear normal.  Eyes:     General: No scleral icterus.       Right eye: No discharge.        Left eye: No discharge.     Extraocular Movements: Extraocular movements intact.     Conjunctiva/sclera: Conjunctivae normal.  Pulmonary:     Effort: Pulmonary effort is normal. No respiratory distress.  Skin:    General: Skin is warm and dry.  Neurological:     Mental Status: She is alert and oriented to person, place, and time.  Psychiatric:        Mood and Affect: Mood normal.        Behavior: Behavior normal.      No results found for any visits on 02/09/23.    The 10-year ASCVD risk score (Arnett DK, et al., 2019) is: 24.8%    Assessment & Plan:   Abnormal  mammogram -     Ambulatory referral to General Surgery    Return if symptoms worsen or fail to improve.  Referred to oncological surgeon, Bianca Medina in [place].  She understands that this is a delay in her treatment.  [name], MD

## 2023-02-12 DIAGNOSIS — M25561 Pain in right knee: Secondary | ICD-10-CM | POA: Diagnosis not present

## 2023-02-12 DIAGNOSIS — M79601 Pain in right arm: Secondary | ICD-10-CM | POA: Diagnosis not present

## 2023-02-12 DIAGNOSIS — R262 Difficulty in walking, not elsewhere classified: Secondary | ICD-10-CM | POA: Diagnosis not present

## 2023-02-12 DIAGNOSIS — R531 Weakness: Secondary | ICD-10-CM | POA: Diagnosis not present

## 2023-02-14 ENCOUNTER — Other Ambulatory Visit: Payer: Self-pay | Admitting: Family Medicine

## 2023-02-14 DIAGNOSIS — E7800 Pure hypercholesterolemia, unspecified: Secondary | ICD-10-CM

## 2023-02-17 DIAGNOSIS — C50411 Malignant neoplasm of upper-outer quadrant of right female breast: Secondary | ICD-10-CM | POA: Diagnosis not present

## 2023-02-17 DIAGNOSIS — R590 Localized enlarged lymph nodes: Secondary | ICD-10-CM | POA: Diagnosis not present

## 2023-02-17 DIAGNOSIS — C50911 Malignant neoplasm of unspecified site of right female breast: Secondary | ICD-10-CM | POA: Diagnosis not present

## 2023-02-17 DIAGNOSIS — I872 Venous insufficiency (chronic) (peripheral): Secondary | ICD-10-CM | POA: Diagnosis not present

## 2023-02-26 ENCOUNTER — Encounter: Payer: Self-pay | Admitting: Podiatry

## 2023-02-26 ENCOUNTER — Ambulatory Visit: Payer: Medicare PPO | Admitting: Podiatry

## 2023-02-26 DIAGNOSIS — R600 Localized edema: Secondary | ICD-10-CM | POA: Diagnosis not present

## 2023-02-26 DIAGNOSIS — M25561 Pain in right knee: Secondary | ICD-10-CM | POA: Diagnosis not present

## 2023-02-26 DIAGNOSIS — M779 Enthesopathy, unspecified: Secondary | ICD-10-CM | POA: Diagnosis not present

## 2023-02-26 DIAGNOSIS — R531 Weakness: Secondary | ICD-10-CM | POA: Diagnosis not present

## 2023-02-26 DIAGNOSIS — R262 Difficulty in walking, not elsewhere classified: Secondary | ICD-10-CM | POA: Diagnosis not present

## 2023-02-26 DIAGNOSIS — M79601 Pain in right arm: Secondary | ICD-10-CM | POA: Diagnosis not present

## 2023-02-28 NOTE — Progress Notes (Signed)
Subjective:   Patient ID: Bianca Medina, female   DOB: 72 y.o.   MRN: [id number]   HPI Patient points stating starting to develop pain in the left ankle and swelling that is occurring.  States that it is bothersome and does not remember specific injury   ROS      Objective:  Physical Exam  Neurovascular status intact with the patient found to have moderate edema around the left lateral ankle mild reduction of motion of the subtalar joint with slight crepitus noted     Assessment:  Inflammatory condition with sinus tarsitis fluid buildup and swelling negative Homans' sign     Plan:  H&P reviewed at this point reviewed x-ray compression stocking dispensed continue elevation reappoint to recheck as needed  X-rays indicate that there is no signs of fracture there is some arthritis subtalar joint identified

## 2023-03-15 DIAGNOSIS — C50911 Malignant neoplasm of unspecified site of right female breast: Secondary | ICD-10-CM | POA: Diagnosis not present

## 2023-03-22 ENCOUNTER — Telehealth: Payer: Self-pay | Admitting: Neurology

## 2023-03-22 NOTE — Telephone Encounter (Signed)
Mychart Message

## 2023-03-29 ENCOUNTER — Telehealth: Payer: Self-pay | Admitting: Family Medicine

## 2023-03-29 NOTE — Telephone Encounter (Signed)
Pt would like a call to discuss her bleeding from her rear in the past and making sure it is in her chart.  Bianca Medina [phone]

## 2023-03-30 DIAGNOSIS — M25561 Pain in right knee: Secondary | ICD-10-CM | POA: Diagnosis not present

## 2023-03-30 DIAGNOSIS — R531 Weakness: Secondary | ICD-10-CM | POA: Diagnosis not present

## 2023-03-30 DIAGNOSIS — R262 Difficulty in walking, not elsewhere classified: Secondary | ICD-10-CM | POA: Diagnosis not present

## 2023-03-30 DIAGNOSIS — M79601 Pain in right arm: Secondary | ICD-10-CM | POA: Diagnosis not present

## 2023-03-31 DIAGNOSIS — C50911 Malignant neoplasm of unspecified site of right female breast: Secondary | ICD-10-CM | POA: Diagnosis not present

## 2023-04-01 ENCOUNTER — Ambulatory Visit: Payer: Medicare PPO | Admitting: Orthopaedic Surgery

## 2023-04-01 ENCOUNTER — Encounter: Payer: Self-pay | Admitting: Orthopaedic Surgery

## 2023-04-01 DIAGNOSIS — M79631 Pain in right forearm: Secondary | ICD-10-CM

## 2023-04-01 DIAGNOSIS — G8929 Other chronic pain: Secondary | ICD-10-CM | POA: Diagnosis not present

## 2023-04-01 DIAGNOSIS — M25562 Pain in left knee: Secondary | ICD-10-CM

## 2023-04-01 DIAGNOSIS — M25572 Pain in left ankle and joints of left foot: Secondary | ICD-10-CM | POA: Diagnosis not present

## 2023-04-01 NOTE — Progress Notes (Signed)
The patient still has the same aches and complains that she has had 3 months ago which are appropriate complaints.  She has been dealing with left knee pain and right forearm and elbow pain.  The pain radiates down into her hand on the right side.  She had nerve conduction study showing mild carpal tunnel syndrome.  She is someone who is morbidly obese and her hemoglobin A1c 2 months ago was 7.9.  5 months ago was also 7.9.  I think that her obesity and her poor blood glucose control are certainly impacting her from a decreased mobility standpoint and pain standpoint.  She has been in therapy as well.  Her right elbow, hand and wrist move well.  She does have some radicular type of pain.  Her left knee has good motion and slightly hyperextends but that is her right knee and this is more consistent with her body habitus.  Her left ankle seems to move well and she does have some pain in a better heel cord is not tight.  The Achilles is definitely intact.  Her foot is not flat.  She has seen a foot and ankle specialist with podiatry and they had recommended an ASO.  My standpoint there is nothing else that I can really recommend right now.  There is nothing that she really needs from a surgical standpoint and she really needs to get weight off of her pain and blood glucose under control and she understands this as well.  Follow-up can be as needed.

## 2023-04-07 DIAGNOSIS — R262 Difficulty in walking, not elsewhere classified: Secondary | ICD-10-CM | POA: Diagnosis not present

## 2023-04-07 DIAGNOSIS — M25561 Pain in right knee: Secondary | ICD-10-CM | POA: Diagnosis not present

## 2023-04-07 DIAGNOSIS — M79601 Pain in right arm: Secondary | ICD-10-CM | POA: Diagnosis not present

## 2023-04-07 DIAGNOSIS — R531 Weakness: Secondary | ICD-10-CM | POA: Diagnosis not present

## 2023-04-09 ENCOUNTER — Other Ambulatory Visit: Payer: Self-pay | Admitting: Family

## 2023-04-09 DIAGNOSIS — E7800 Pure hypercholesterolemia, unspecified: Secondary | ICD-10-CM

## 2023-04-13 DIAGNOSIS — R531 Weakness: Secondary | ICD-10-CM | POA: Diagnosis not present

## 2023-04-13 DIAGNOSIS — M79601 Pain in right arm: Secondary | ICD-10-CM | POA: Diagnosis not present

## 2023-04-13 DIAGNOSIS — M25561 Pain in right knee: Secondary | ICD-10-CM | POA: Diagnosis not present

## 2023-04-13 DIAGNOSIS — R262 Difficulty in walking, not elsewhere classified: Secondary | ICD-10-CM | POA: Diagnosis not present

## 2023-04-21 DIAGNOSIS — C50911 Malignant neoplasm of unspecified site of right female breast: Secondary | ICD-10-CM | POA: Diagnosis not present

## 2023-04-27 DIAGNOSIS — I10 Essential (primary) hypertension: Secondary | ICD-10-CM | POA: Diagnosis not present

## 2023-04-27 DIAGNOSIS — E782 Mixed hyperlipidemia: Secondary | ICD-10-CM | POA: Diagnosis not present

## 2023-04-27 DIAGNOSIS — E559 Vitamin D deficiency, unspecified: Secondary | ICD-10-CM | POA: Diagnosis not present

## 2023-04-27 DIAGNOSIS — E119 Type 2 diabetes mellitus without complications: Secondary | ICD-10-CM | POA: Diagnosis not present

## 2023-04-29 ENCOUNTER — Ambulatory Visit: Payer: Medicare PPO | Admitting: Family Medicine

## 2023-04-29 ENCOUNTER — Telehealth: Payer: Self-pay

## 2023-04-29 VITALS — BP 132/80 | HR 90 | Temp 96.9°F | Wt 263.2 lb

## 2023-04-29 DIAGNOSIS — J4551 Severe persistent asthma with (acute) exacerbation: Secondary | ICD-10-CM | POA: Insufficient documentation

## 2023-04-29 DIAGNOSIS — R0981 Nasal congestion: Secondary | ICD-10-CM

## 2023-04-29 LAB — POCT INFLUENZA A/B
Influenza A, POC: NEGATIVE
Influenza B, POC: NEGATIVE

## 2023-04-29 LAB — POC COVID19 BINAXNOW: SARS Coronavirus 2 Ag: NEGATIVE

## 2023-04-29 MED ORDER — IPRATROPIUM-ALBUTEROL 0.5-2.5 (3) MG/3ML IN SOLN: 3.0000 mL | Freq: Four times a day (QID) | RESPIRATORY_TRACT | 0 refills | Status: AC | PRN

## 2023-04-29 MED ORDER — PREDNISONE 50 MG PO TABS
ORAL_TABLET | ORAL | 0 refills | Status: AC
Start: 2023-04-29 — End: ?

## 2023-04-29 MED ORDER — IPRATROPIUM-ALBUTEROL 0.5-2.5 (3) MG/3ML IN SOLN
3.0000 mL | Freq: Once | RESPIRATORY_TRACT | Status: AC
Start: 2023-04-29 — End: 2023-04-29
  Administered 2023-04-29: 3 mL via RESPIRATORY_TRACT

## 2023-04-29 NOTE — Telephone Encounter (Signed)
Left patient a detailed voice message advising her that [employer] order for her nebulizer went through successfully, it will be mailed to her home and to return call to office with any questions. Confirmation from parachute app placed below.

## 2023-04-29 NOTE — Assessment & Plan Note (Signed)
Administered DuoNeb (albuterol and ipratropium bromide) nebulizer treatment in-office; significant improvement noted post-treatment. Prescribed prednisone 50 mg orally once daily for 5 days to reduce airway inflammation. Prescribed home nebulizer machine with DuoNebs; use every 4-6 hours as needed for wheezing and cough. Advised continuation of Symbicort inhaler (2 puffs twice daily) and albuterol inhaler as needed. Educated on medication adherence and monitoring of symptoms. Instructed to monitor for signs of worsening condition and to seek immediate medical attention if necessary.

## 2023-04-29 NOTE — Patient Instructions (Addendum)
Continue Symbicort. For rescue use either albuterol inhaler or DuoNeb nebulizer every 4-6 hours as needed for wheezing. We are sending in a nebulizer [facility]. We will contact you when we process this.  Take prednisone 50 mg for 5 days.

## 2023-04-29 NOTE — Progress Notes (Signed)
Assessment/Plan:   Problem List Items Addressed This Visit       Respiratory   Severe persistent asthma with exacerbation - Primary   Administered DuoNeb (albuterol and ipratropium bromide) nebulizer treatment in-office; significant improvement noted post-treatment. Prescribed prednisone 50 mg orally once daily for 5 days to reduce airway inflammation. Prescribed home nebulizer machine with DuoNebs; use every 4-6 hours as needed for wheezing and cough. Advised continuation of Symbicort inhaler (2 puffs twice daily) and albuterol inhaler as needed. Educated on medication adherence and monitoring of symptoms. Instructed to monitor for signs of worsening condition and to seek immediate medical attention if necessary.      Relevant Medications   ipratropium-albuterol (DUONEB) 0.5-2.5 (3) MG/3ML SOLN   predniSONE (DELTASONE) 50 MG tablet   Other Relevant Orders   POC COVID-19 BinaxNow (Completed)   POCT Influenza A/B (Completed)   For home use only DME Nebulizer machine    There are no discontinued medications.  Return in about 2 weeks (around 05/13/2023), or if symptoms worsen or fail to improve, for asthma.    Subjective:   Encounter date: 04/29/2023  Bianca Medina is a 72 y.o. female who has Essential hypertension; Heartburn; Skin lesion; Menopause; Vaginal atrophy; Left breast mass; Breast tenderness in female; Morbid obesity (HCC); Sensorineural hearing loss (SNHL), bilateral; Tinnitus of both ears; History of anemia; Pre-diabetes; Viral syndrome; Common migraine; Ear itching; Healthcare maintenance; Vitamin D deficiency; Dehydration; History of syncope; Hospital discharge follow-up; Medicare annual wellness visit, subsequent; Snoring; Sleep related bruxism; Gasping for breath; Insomnia due to anxiety and fear; Class 3 severe obesity due to excess calories with serious comorbidity and body mass index (BMI) of 40.0 to 44.9 in adult [facility]); Iron deficiency; Need for influenza  vaccination; Rash; Screening for colon cancer; Insect bite of right wrist; Bite, insect; Other schizoaffective disorders (HCC); Thyroid nodule; Bruising; Left leg swelling; Hyperlipidemia; Slow transit constipation; Diverticulitis; Cellulitis; Leukocytosis; Invasive ductal carcinoma of breast (HCC); Type 2 diabetes mellitus with complication, without long-term current use of insulin (HCC); Right knee pain; Mood disorder (HCC); Facial droop; Pain of left upper extremity; Vitamin E deficiency; Leg cramps; Venous stasis of both lower extremities; Arthritis of right knee; Follow-up medical care requested by patient; Concerned about care plan; DM hyperosmolarity type II, uncontrolled (HCC); Hypersomnia; Non-restorative sleep; and Severe persistent asthma with exacerbation on their problem list..   She  has a past medical history of Adenomatous colon polyp (2021), Anemia (2003), Asthma, Breast cancer (HCC), Chronic anal fissure, Common migraine (06/23/2019), Diabetes mellitus without complication (HCC), GERD (gastroesophageal reflux disease), Hearing loss, Hyperlipidemia, mild, Hypertension, IDA (iron deficiency anemia), NSVD (normal spontaneous vaginal delivery), Obesity, PMDD (premenstrual dysphoric disorder), Sensorineural hearing loss (SNHL) of both ears, and Sleep disorder..   Chief Complaint: Persistent cough, fatigue, and congestion for 6 days.  History of Present Illness:   Patient is a 72 year old individual presenting with a 6-day history of persistent productive cough, fatigue, and nasal congestion. Symptoms began on 04/23/2023 with initial fatigue followed by the onset of cough producing phlegm. Temperature reached up to 99.30F initially, but no recent fevers reported. Early body aches have resolved. Nasal congestion is improving. Patient reports difficulty breathing attributed to congestion and wheezing. Denies chest pain. Regularly using Symbicort inhaler and albuterol inhaler as needed (used twice  recently with relief). Taking over-the-counter Tylenol Severe Cold and Flu, baby aspirin, and using Flonase nasal spray. Patient believes antibiotics are needed due to persistent symptoms.  Review of Systems:  Constitutional: Fatigue, no current  fever or chills. HEENT: Nasal congestion improving, productive cough. Respiratory: Persistent cough with phlegm, difficulty breathing due to congestion, wheezing noted. Cardiovascular: No chest pain or palpitations. Musculoskeletal: No current body aches.  Past Surgical History:  Procedure Laterality Date   ABDOMINAL HYSTERECTOMY     BLADDER REPAIR     BREAST BIOPSY Right    2023   COLONOSCOPY  11/2019   1 hpp and 1 tubl adenoma   COLONOSCOPY WITH PROPOFOL N/A 12/03/2021   Procedure: COLONOSCOPY WITH PROPOFOL;  Surgeon: Earline Mayotte, MD;  Location: [facility] ENDOSCOPY;  Service: Endoscopy;  Laterality: N/A;   GUM SURGERY  1980's   MANDIBLE FRACTURE SURGERY     TUBAL LIGATION      Outpatient Medications Prior to Visit  Medication Sig Dispense Refill   acetaminophen (TYLENOL) 500 MG tablet Take 500 mg by mouth every 6 (six) hours as needed for moderate pain.     aspirin EC 81 MG tablet Take 81 mg by mouth daily. Swallow whole.     atorvastatin (LIPITOR) 10 MG tablet TAKE 1 TABLET BY MOUTH EVERY DAY 90 tablet 3   Biotin 5000 MCG CAPS Take 5,000 mcg by mouth daily.      celecoxib (CELEBREX) 200 MG capsule Take 1 capsule (200 mg total) by mouth 2 (two) times daily between meals as needed. 60 capsule 3   chlorthalidone (HYGROTON) 25 MG tablet TAKE 1 TABLET (25 MG TOTAL) BY MOUTH DAILY. 90 tablet 1   diclofenac Sodium (VOLTAREN) 1 % GEL Apply 2 g topically 4 (four) times daily. 350 g 1   fluticasone (FLONASE) 50 MCG/ACT nasal spray Place 2 sprays into both nostrils daily. 16 g 6   losartan (COZAAR) 25 MG tablet Take 25 mg by mouth daily.     metFORMIN (GLUCOPHAGE-XR) 500 MG 24 hr tablet Take 1 tablet (500 mg total) by mouth 2 (two) times daily  with a meal. 180 tablet 1   Potassium Chloride ER 20 MEQ TBCR Take 1 tablet by mouth daily.     PROAIR HFA 108 (90 Base) MCG/ACT inhaler Inhale 1-2 puffs into the lungs every 4 (four) hours as needed for wheezing or shortness of breath.      SYMBICORT 80-4.5 MCG/ACT inhaler Inhale 2 puffs into the lungs 2 (two) times daily.     triamcinolone cream (KENALOG) 0.1 % Apply topically 2 (two) times daily.     Vitamin D, Ergocalciferol, (DRISDOL) 1.25 MG (50000 UNIT) CAPS capsule TAKE 1 CAPSULE BY MOUTH ONE TIME PER WEEK 12 capsule 2   vitamin E 1000 UNIT capsule Take 1,000 Units by mouth daily.     No facility-administered medications prior to visit.    Family History  Problem Relation Age of Onset   Cancer Mother        LUNG- SMOKER   Diabetes Father    Hypertension Father    Heart disease Father    Stroke Father    Breast cancer Sister        lates 61s   Cancer Paternal Aunt        COLON   Cancer Paternal Uncle        COLON    Social History   Socioeconomic History   Marital status: Divorced    Spouse name: Not on file   Number of children: 3   Years of education: college   Highest education level: Not on file  Occupational History   Occupation: Interior and spatial designer: [employer]  Tobacco Use   Smoking status: Never   Smokeless tobacco: Never  Vaping Use   Vaping status: Never Used  Substance and Sexual Activity   Alcohol use: Not Currently   Drug use: Never   Sexual activity: Not Currently    Partners: Male    Birth control/protection: Surgical, Abstinence    Comment: HYSTERECTOMY-1st intercourse 17 yo-5 partners  Other Topics Concern   Not on file  Social History Narrative   Lives alone   Right handed     Caffeine use: soda/tea couple times a week   Social Drivers of Corporate investment banker Strain: Low Risk  (03/31/2022)   Overall Financial Resource Strain (CARDIA)    Difficulty of Paying Living Expenses: Not hard  at all  Food Insecurity: No Food Insecurity (04/27/2022)   Hunger Vital Sign    Worried About Running Out of Food in the Last Year: Never true    Ran Out of Food in the Last Year: Never true  Transportation Needs: No Transportation Needs (04/27/2022)   PRAPARE - Administrator, Civil Service (Medical): No    Lack of Transportation (Non-Medical): No  Physical Activity: Inactive (03/31/2022)   Exercise Vital Sign    Days of Exercise per Week: 0 days    Minutes of Exercise per Session: 0 min  Stress: No Stress Concern Present (03/31/2022)   [employer] of Occupational Health - Occupational Stress Questionnaire    Feeling of Stress : Not at all  Social Connections: Unknown (09/16/2021)   Received from [facility]   Social Network    Social Network: Not on file  Intimate Partner Violence: Not At Risk (04/27/2022)   Humiliation, Afraid, Rape, and Kick questionnaire    Fear of Current or Ex-Partner: No    Emotionally Abused: No    Physically Abused: No    Sexually Abused: No                                                                                                  Objective:  Physical Exam: BP 132/80 (BP Location: Left Arm, Patient Position: Sitting, Cuff Size: Large)   Pulse 90   Temp (!) 96.9 F (36.1 C) (Temporal)   Wt 263 lb 3.2 oz (119.4 kg)   SpO2 94%   BMI 43.80 kg/m     Physical Exam Constitutional:      General: She is not in acute distress.    Appearance: Normal appearance. She is not ill-appearing or toxic-appearing.  HENT:     Head: Normocephalic and atraumatic.     Nose: Nose normal. No congestion.  Eyes:     General: No scleral icterus.    Extraocular Movements: Extraocular movements intact.  Cardiovascular:     Rate and Rhythm: Normal rate and regular rhythm.     Pulses: Normal pulses.     Heart sounds: Normal heart sounds.  Pulmonary:     Effort: Pulmonary effort is normal. No respiratory distress.     Breath  sounds: Examination of the right-upper field reveals wheezing. Examination of  the left-upper field reveals wheezing. Examination of the right-middle field reveals wheezing. Examination of the left-middle field reveals wheezing. Examination of the right-lower field reveals wheezing. Examination of the left-lower field reveals wheezing. Wheezing present.  Abdominal:     General: Abdomen is flat. Bowel sounds are normal.     Palpations: Abdomen is soft.  Musculoskeletal:        General: Normal range of motion.  Lymphadenopathy:     Cervical: No cervical adenopathy.  Skin:    General: Skin is warm and dry.     Findings: No rash.  Neurological:     General: No focal deficit present.     Mental Status: She is alert and oriented to person, place, and time. Mental status is at baseline.  Psychiatric:        Mood and Affect: Mood normal.        Behavior: Behavior normal.        Thought Content: Thought content normal.        Judgment: Judgment normal.     No results found.  Recent Results (from the past 2160 hours)  POC COVID-19 BinaxNow     Status: None   Collection Time: 04/29/23  2:18 PM  Result Value Ref Range   SARS Coronavirus 2 Ag Negative Negative  POCT Influenza A/B     Status: None   Collection Time: 04/29/23  2:19 PM  Result Value Ref Range   Influenza A, POC Negative Negative   Influenza B, POC Negative Negative        Garner Nash, MD, MS

## 2023-05-03 ENCOUNTER — Encounter: Payer: Self-pay | Admitting: Internal Medicine

## 2023-05-03 ENCOUNTER — Ambulatory Visit: Payer: Medicare PPO | Admitting: Internal Medicine

## 2023-05-03 VITALS — BP 134/76 | HR 74 | Temp 97.7°F | Ht 65.0 in | Wt 267.6 lb

## 2023-05-03 DIAGNOSIS — J4551 Severe persistent asthma with (acute) exacerbation: Secondary | ICD-10-CM

## 2023-05-03 DIAGNOSIS — J22 Unspecified acute lower respiratory infection: Secondary | ICD-10-CM

## 2023-05-03 MED ORDER — IPRATROPIUM-ALBUTEROL 0.5-2.5 (3) MG/3ML IN SOLN
3.0000 mL | Freq: Once | RESPIRATORY_TRACT | Status: AC
Start: 2023-05-03 — End: 2023-05-03
  Administered 2023-05-03: 3 mL via RESPIRATORY_TRACT

## 2023-05-03 MED ORDER — AZITHROMYCIN 250 MG PO TABS
ORAL_TABLET | ORAL | 0 refills | Status: DC
Start: 2023-05-03 — End: 2023-05-07

## 2023-05-03 NOTE — Progress Notes (Signed)
[facility] [address] [place] [address] Dept: [phone] Dept Fax: [phone]  Acute Care Office Visit  Subjective:   Bianca Medina September 02, 1950 05/03/2023  Chief Complaint  Patient presents with   Breathing Problem    HPI: Discussed the use of AI scribe software for clinical note transcription with the patient, who gave verbal consent to proceed.  History of Present Illness   The patient, with a history of severe persistent asthma, presents for a follow-up visit after an asthma exacerbation. They were seen on 12/12 for exacerbation,given duoneb tx in office, Rx for prednisone and nebs. They report that they are waiting for a nebulizermachine to be delivered to their home and request another breathing treatment in the meantime. They have been taking prednisone and continuing Symbicort twice a day as prescribed.  The patient reports feeling some better since their last visit. However, they have been experiencing difficulty breathing, describing their chest as "really tight." They also report a persistent cough and have been producing and expelling a significant amount of phlegm. They deny having a fever but did experience chills during their initial visit. The patient also mentions using a portable saline solution to alleviate sinus pressure and headaches.    The following portions of the patient's history were reviewed and updated as appropriate: past medical history, past surgical history, family history, social history, allergies, medications, and problem list.   Patient Active Problem List   Diagnosis Date Noted   Severe persistent asthma with exacerbation 04/29/2023   DM hyperosmolarity type II, uncontrolled (HCC) 02/08/2023   Hypersomnia 02/08/2023   Non-restorative sleep 02/08/2023   Venous stasis of both lower extremities 09/04/2022   Arthritis of right knee 09/04/2022   Follow-up medical care requested by  patient 09/04/2022   Concerned about care plan 09/04/2022   Vitamin E deficiency 07/22/2022   Leg cramps 07/22/2022   Facial droop 06/16/2022   Pain of left upper extremity 06/16/2022   Right knee pain 06/11/2022   Mood disorder (HCC) 06/11/2022   Diverticulitis 04/26/2022   Cellulitis 04/26/2022   Leukocytosis 04/26/2022   Invasive ductal carcinoma of breast (HCC) 04/26/2022   Type 2 diabetes mellitus with complication, without long-term current use of insulin (HCC) 04/26/2022   Slow transit constipation 01/26/2022   Left leg swelling 11/10/2021   Hyperlipidemia 11/10/2021   Bruising 06/11/2021   Other schizoaffective disorders (HCC) 04/30/2021   Thyroid nodule 04/30/2021   Screening for colon cancer 04/01/2021   Insect bite of right wrist 04/01/2021   Bite, insect 04/01/2021   Rash 03/28/2021   Iron deficiency 03/19/2021   Need for influenza vaccination 03/19/2021   Snoring 01/27/2021   Sleep related bruxism 01/27/2021   Gasping for breath 01/27/2021   Insomnia due to anxiety and fear 01/27/2021   Class 3 severe obesity due to excess calories with serious comorbidity and body mass index (BMI) of 40.0 to 44.9 in adult [facility]) 01/27/2021   Medicare annual wellness visit, subsequent 01/24/2021   Dehydration 12/26/2020   History of syncope 12/26/2020   Hospital discharge follow-up 12/26/2020   Healthcare maintenance 12/01/2020   Vitamin D deficiency 12/01/2020   Ear itching 06/27/2019   Common migraine 06/23/2019   History of anemia 03/31/2019   Pre-diabetes 03/31/2019   Viral syndrome 03/31/2019   Sensorineural hearing loss (SNHL), bilateral 08/12/2017   Tinnitus of both ears 08/12/2017   Morbid obesity (HCC) 12/28/2016   Left breast mass 07/19/2014   Breast tenderness in female  07/19/2014   Menopause 01/09/2014   Vaginal atrophy 01/09/2014   Skin lesion 12/23/2012   Essential hypertension 10/31/2012   Heartburn 10/31/2012   Past Medical History:  Diagnosis Date    Adenomatous colon polyp 2021   Anemia 2003   IRON DEFICIENCY    Asthma    Breast cancer (HCC)    right breast   Chronic anal fissure    Common migraine 06/23/2019   Diabetes mellitus without complication (HCC)    GERD (gastroesophageal reflux disease)    Hearing loss    Hyperlipidemia, mild    Hypertension    IDA (iron deficiency anemia)    NSVD (normal spontaneous vaginal delivery)    X3   Obesity    PMDD (premenstrual dysphoric disorder)    Sensorineural hearing loss (SNHL) of both ears    Sleep disorder    Past Surgical History:  Procedure Laterality Date   ABDOMINAL HYSTERECTOMY     BLADDER REPAIR     BREAST BIOPSY Right    2023   COLONOSCOPY  11/2019   1 hpp and 1 tubl adenoma   COLONOSCOPY WITH PROPOFOL N/A 12/03/2021   Procedure: COLONOSCOPY WITH PROPOFOL;  Surgeon: Earline Mayotte, MD;  Location: [facility] ENDOSCOPY;  Service: Endoscopy;  Laterality: N/A;   GUM SURGERY  1980's   MANDIBLE FRACTURE SURGERY     TUBAL LIGATION     Family History  Problem Relation Age of Onset   Cancer Mother        LUNG- SMOKER   Diabetes Father    Hypertension Father    Heart disease Father    Stroke Father    Breast cancer Sister        lates 27s   Cancer Paternal Aunt        COLON   Cancer Paternal Uncle        COLON    Current Outpatient Medications:    acetaminophen (TYLENOL) 500 MG tablet, Take 500 mg by mouth every 6 (six) hours as needed for moderate pain., Disp: , Rfl:    aspirin EC 81 MG tablet, Take 81 mg by mouth daily. Swallow whole., Disp: , Rfl:    atorvastatin (LIPITOR) 10 MG tablet, TAKE 1 TABLET BY MOUTH EVERY DAY, Disp: 90 tablet, Rfl: 3   azithromycin (ZITHROMAX) 250 MG tablet, Take 2 tablets on day 1, then 1 tablet daily on days 2 through 5, Disp: 6 tablet, Rfl: 0   Biotin 5000 MCG CAPS, Take 5,000 mcg by mouth daily. , Disp: , Rfl:    celecoxib (CELEBREX) 200 MG capsule, Take 1 capsule (200 mg total) by mouth 2 (two) times daily between meals as  needed., Disp: 60 capsule, Rfl: 3   chlorthalidone (HYGROTON) 25 MG tablet, TAKE 1 TABLET (25 MG TOTAL) BY MOUTH DAILY., Disp: 90 tablet, Rfl: 1   diclofenac Sodium (VOLTAREN) 1 % GEL, Apply 2 g topically 4 (four) times daily., Disp: 350 g, Rfl: 1   fluticasone (FLONASE) 50 MCG/ACT nasal spray, Place 2 sprays into both nostrils daily., Disp: 16 g, Rfl: 6   ipratropium-albuterol (DUONEB) 0.5-2.5 (3) MG/3ML SOLN, Take 3 mLs by nebulization every 6 (six) hours as needed (wheezing)., Disp: 360 mL, Rfl: 0   losartan (COZAAR) 25 MG tablet, Take 25 mg by mouth daily., Disp: , Rfl:    metFORMIN (GLUCOPHAGE-XR) 500 MG 24 hr tablet, Take 1 tablet (500 mg total) by mouth 2 (two) times daily with a meal., Disp: 180 tablet, Rfl: 1   Potassium  Chloride ER 20 MEQ TBCR, Take 1 tablet by mouth daily., Disp: , Rfl:    predniSONE (DELTASONE) 50 MG tablet, Take 1 tablet daily for 5 days., Disp: 5 tablet, Rfl: 0   PROAIR HFA 108 (90 Base) MCG/ACT inhaler, Inhale 1-2 puffs into the lungs every 4 (four) hours as needed for wheezing or shortness of breath. , Disp: , Rfl:    SYMBICORT 80-4.5 MCG/ACT inhaler, Inhale 2 puffs into the lungs 2 (two) times daily., Disp: , Rfl:    triamcinolone cream (KENALOG) 0.1 %, Apply topically 2 (two) times daily., Disp: , Rfl:    Vitamin D, Ergocalciferol, (DRISDOL) 1.25 MG (50000 UNIT) CAPS capsule, TAKE 1 CAPSULE BY MOUTH ONE TIME PER WEEK, Disp: 12 capsule, Rfl: 2   vitamin E 1000 UNIT capsule, Take 1,000 Units by mouth daily., Disp: , Rfl:  Allergies  Allergen Reactions   Codeine Other (See Comments)    nightmares  Other Reaction(s): Other (See Comments)    nightmares     ROS: A complete ROS was performed with pertinent positives/negatives noted in the HPI. The remainder of the ROS are negative.    Objective:   Today's Vitals   05/03/23 0852  BP: 134/76  Pulse: 74  Temp: 97.7 F (36.5 C)  TempSrc: Temporal  SpO2: 96%  Weight: 267 lb 9.6 oz (121.4 kg)  Height: 5\' 5"   (1.651 m)    GENERAL: Well-appearing, in NAD. Well nourished.  SKIN: Pink, warm and dry. No rash.  NECK: Trachea midline. Full ROM w/o pain or tenderness. No lymphadenopathy.  RESPIRATORY: Chest wall symmetrical. Respirations even and non-labored. Expiratory wheezing present throughout bilaterally.  CARDIAC: S1, S2 present, regular rate and rhythm. Peripheral pulses 2+ bilaterally.  EXTREMITIES: Without clubbing, cyanosis, or edema.  NEUROLOGIC: Steady, even gait.  PSYCH/MENTAL STATUS: Alert, oriented x 3. Cooperative, appropriate mood and affect.    No results found for any visits on 05/03/23.    Assessment & Plan:  Assessment and Plan    Severe Persistent Asthma with Exacerbation Improvement in symptoms since last visit, but still experiencing chest tightness and difficulty breathing. No fever or chills. Symbicort has been effective in managing symptoms in the past. -Administer DuoNeb treatment today in office. -Continue Prednisone as previously prescribed. -Continue Symbicort twice daily. - Nebulizer machine is to be delivered to her house today or tomorrow.   Acute Respiratory Infection Persistent cough and nasal congestion. Improvement in ability to clear phlegm and nasal secretions. Some sinus pressure reported. -Continue use of saline solution and Fluticasone to manage symptoms. -Start Azithromycin (Z-Pak) to address potential infection contributing to symptoms.       Meds ordered this encounter  Medications   ipratropium-albuterol (DUONEB) 0.5-2.5 (3) MG/3ML nebulizer solution 3 mL   azithromycin (ZITHROMAX) 250 MG tablet    Sig: Take 2 tablets on day 1, then 1 tablet daily on days 2 through 5    Dispense:  6 tablet    Refill:  0    Supervising Provider:   [name] [[id number]]   No orders of the defined types were placed in this encounter.  Lab Orders  No laboratory test(s) ordered today   No images are attached to the encounter or orders placed in the  encounter.  Return if symptoms worsen or fail to improve.   Salvatore Decent, FNP

## 2023-05-03 NOTE — Patient Instructions (Addendum)
Continue Symbicort   Continue nebulizer treatments at home - please call us if you do not receive your nebulizer in the next couple days   Start antibiotic   Complete steroid pack as directed on packaging

## 2023-05-04 ENCOUNTER — Telehealth: Payer: Self-pay | Admitting: Family Medicine

## 2023-05-04 NOTE — Telephone Encounter (Signed)
 Left voicemail asking patient to return call at [phone].

## 2023-05-04 NOTE — Telephone Encounter (Signed)
Patient was seen by Izola Price on 12/16 she would like a call back regarding the medication that has been prescribed to her. Please call

## 2023-05-05 ENCOUNTER — Other Ambulatory Visit: Payer: Self-pay | Admitting: Internal Medicine

## 2023-05-05 DIAGNOSIS — J22 Unspecified acute lower respiratory infection: Secondary | ICD-10-CM

## 2023-05-05 NOTE — Telephone Encounter (Signed)
Copied from CRM [id number]. Topic: Clinical - Medication Refill >> May 05, 2023  4:17 PM Elizebeth Brooking wrote: Most Recent Primary Care Visit:  Provider: [name]  Department: [facility]  Visit Type: OFFICE VISIT  Date: 04/29/2023  Medication: azithromycin [facility])  Has the patient contacted their pharmacy? Yes (Agent: If no, request that the patient contact the pharmacy for the refill. If patient does not wish to contact the pharmacy document the reason why and proceed with request.) (Agent: If yes, when and what did the pharmacy advise?)  Is this the correct pharmacy for this prescription? Yes If no, delete pharmacy and type the correct one.  This is the patient's preferred pharmacy:  [id number] Ginette Otto,  - [address] AT [facility] [place] [address] [place] [address] Phone: [phone] Fax: [phone]  [id number] - [place], [place] - [address] [place] [address] Phone: [phone] Fax: [phone]   Has the prescription been filled recently? No  Is the patient out of the medication? Yes  Has the patient been seen for an appointment in the last year OR does the patient have an upcoming appointment? Yes  Can we respond through MyChart? Yes  Agent: Please be advised that Rx refills may take up to 3 business days. We ask that you follow-up with your pharmacy.

## 2023-05-05 NOTE — Telephone Encounter (Signed)
Patient returned call and questions were answered.

## 2023-05-06 ENCOUNTER — Telehealth: Payer: Self-pay

## 2023-05-06 NOTE — Telephone Encounter (Signed)
Left patient detailed message asking her to call the office to inform of what pharmacy she would like z-pack sent to

## 2023-05-07 MED ORDER — AZITHROMYCIN 250 MG PO TABS
ORAL_TABLET | ORAL | 0 refills | Status: AC
Start: 2023-05-07 — End: 2023-05-12

## 2023-05-07 NOTE — Telephone Encounter (Signed)
Pharmacy updated and [facility] [address]. Patient advised via mychart.

## 2023-05-07 NOTE — Addendum Note (Signed)
Addended by: Trudee Kuster on: 05/07/2023 08:18 AM   Modules accepted: Orders

## 2023-05-07 NOTE — Telephone Encounter (Signed)
Copied from CRM [id number]. Topic: Clinical - Prescription Issue >> May 06, 2023  4:52 PM Bianca Medina wrote: Reason for CRM: patient called to advise the office that instead of sending the Z-pack to the primary pharmacy she needed it sent [facility] the  [id number]  [address], [place], [place] [address]  [phone]

## 2023-05-13 ENCOUNTER — Ambulatory Visit: Payer: Medicare PPO | Admitting: Family Medicine

## 2023-05-13 ENCOUNTER — Encounter: Payer: Self-pay | Admitting: Family Medicine

## 2023-05-13 VITALS — BP 130/76 | HR 82 | Temp 98.5°F | Ht 65.0 in | Wt 268.6 lb

## 2023-05-13 DIAGNOSIS — R058 Other specified cough: Secondary | ICD-10-CM | POA: Diagnosis not present

## 2023-05-13 MED ORDER — BENZONATATE 200 MG PO CAPS
200.0000 mg | ORAL_CAPSULE | Freq: Two times a day (BID) | ORAL | 0 refills | Status: DC | PRN
Start: 2023-05-13 — End: 2023-09-06

## 2023-05-13 NOTE — Progress Notes (Signed)
Established Patient Office Visit   Subjective:  Patient ID: Bianca Medina, female    DOB: 12/18/1950  Age: 72 y.o. MRN: [id number]  Chief Complaint  Patient presents with   Follow-up    2 week f/u Asthma.  C/o still having the cough.     HPI Encounter Diagnoses  Name Primary?   Recurrent cough Yes   For follow-up of slowly improving cough.  Cough has been mostly nonproductive but there is clear to pale yellow phlegm when productive.  No fevers or chills.  No wheezing.  She has been treated with a home DuoNebs, Zithromax and prednisone.   Review of Systems  Constitutional: Negative.   HENT: Negative.    Eyes:  Negative for blurred vision, discharge and redness.  Respiratory:  Positive for cough. Negative for shortness of breath and wheezing.   Cardiovascular: Negative.   Gastrointestinal:  Negative for abdominal pain.  Genitourinary: Negative.   Musculoskeletal: Negative.  Negative for myalgias.  Skin:  Negative for rash.  Neurological:  Negative for tingling, loss of consciousness and weakness.  Endo/Heme/Allergies:  Negative for polydipsia.     Current Outpatient Medications:    acetaminophen (TYLENOL) 500 MG tablet, Take 500 mg by mouth every 6 (six) hours as needed for moderate pain., Disp: , Rfl:    aspirin EC 81 MG tablet, Take 81 mg by mouth daily. Swallow whole., Disp: , Rfl:    atorvastatin (LIPITOR) 10 MG tablet, TAKE 1 TABLET BY MOUTH EVERY DAY, Disp: 90 tablet, Rfl: 3   benzonatate (TESSALON) 200 MG capsule, Take 1 capsule (200 mg total) by mouth 2 (two) times daily as needed for cough., Disp: 20 capsule, Rfl: 0   Biotin 5000 MCG CAPS, Take 5,000 mcg by mouth daily. , Disp: , Rfl:    celecoxib (CELEBREX) 200 MG capsule, Take 1 capsule (200 mg total) by mouth 2 (two) times daily between meals as needed., Disp: 60 capsule, Rfl: 3   chlorthalidone (HYGROTON) 25 MG tablet, TAKE 1 TABLET (25 MG TOTAL) BY MOUTH DAILY., Disp: 90 tablet, Rfl: 1   diclofenac Sodium  (VOLTAREN) 1 % GEL, Apply 2 g topically 4 (four) times daily., Disp: 350 g, Rfl: 1   fluticasone (FLONASE) 50 MCG/ACT nasal spray, Place 2 sprays into both nostrils daily., Disp: 16 g, Rfl: 6   ipratropium-albuterol (DUONEB) 0.5-2.5 (3) MG/3ML SOLN, Take 3 mLs by nebulization every 6 (six) hours as needed (wheezing)., Disp: 360 mL, Rfl: 0   losartan (COZAAR) 25 MG tablet, Take 25 mg by mouth daily., Disp: , Rfl:    metFORMIN (GLUCOPHAGE-XR) 500 MG 24 hr tablet, Take 1 tablet (500 mg total) by mouth 2 (two) times daily with a meal., Disp: 180 tablet, Rfl: 1   Potassium Chloride ER 20 MEQ TBCR, Take 1 tablet by mouth daily., Disp: , Rfl:    predniSONE (DELTASONE) 50 MG tablet, Take 1 tablet daily for 5 days., Disp: 5 tablet, Rfl: 0   PROAIR HFA 108 (90 Base) MCG/ACT inhaler, Inhale 1-2 puffs into the lungs every 4 (four) hours as needed for wheezing or shortness of breath. , Disp: , Rfl:    SYMBICORT 80-4.5 MCG/ACT inhaler, Inhale 2 puffs into the lungs 2 (two) times daily., Disp: , Rfl:    triamcinolone cream (KENALOG) 0.1 %, Apply topically 2 (two) times daily., Disp: , Rfl:    Vitamin D, Ergocalciferol, (DRISDOL) 1.25 MG (50000 UNIT) CAPS capsule, TAKE 1 CAPSULE BY MOUTH ONE TIME PER WEEK, Disp: 12 capsule, Rfl:  2   vitamin E 1000 UNIT capsule, Take 1,000 Units by mouth daily., Disp: , Rfl:    Objective:     BP 130/76   Pulse 82   Temp 98.5 F (36.9 C) (Temporal)   Ht 5\' 5"  (1.651 m)   Wt 268 lb 9.6 oz (121.8 kg)   SpO2 96%   BMI 44.70 kg/m  Wt Readings from Last 3 Encounters:  05/13/23 268 lb 9.6 oz (121.8 kg)  05/03/23 267 lb 9.6 oz (121.4 kg)  04/29/23 263 lb 3.2 oz (119.4 kg)      Physical Exam Constitutional:      General: She is not in acute distress.    Appearance: Normal appearance. She is not ill-appearing, toxic-appearing or diaphoretic.  HENT:     Head: Normocephalic and atraumatic.     Right Ear: External ear normal.     Left Ear: External ear normal.      Mouth/Throat:     Mouth: Mucous membranes are moist.     Pharynx: Oropharynx is clear. No oropharyngeal exudate or posterior oropharyngeal erythema.  Eyes:     General: No scleral icterus.       Right eye: No discharge.        Left eye: No discharge.     Extraocular Movements: Extraocular movements intact.     Conjunctiva/sclera: Conjunctivae normal.     Pupils: Pupils are equal, round, and reactive to light.  Cardiovascular:     Rate and Rhythm: Normal rate and regular rhythm.  Pulmonary:     Effort: Pulmonary effort is normal. No respiratory distress.     Breath sounds: Normal breath sounds. No wheezing, rhonchi or rales.  Abdominal:     General: Bowel sounds are normal.     Tenderness: There is no abdominal tenderness. There is no guarding.  Musculoskeletal:     Cervical back: No rigidity or tenderness.  Skin:    General: Skin is warm and dry.  Neurological:     Mental Status: She is alert and oriented to person, place, and time.  Psychiatric:        Mood and Affect: Mood normal.        Behavior: Behavior normal.      No results found for any visits on 05/13/23.    The 10-year ASCVD risk score (Arnett DK, et al., 2019) is: 23.2%    Assessment & Plan:   Recurrent cough -     Benzonatate; Take 1 capsule (200 mg total) by mouth 2 (two) times daily as needed for cough.  Dispense: 20 capsule; Refill: 0 -     DG Chest 2 View; Future    Return if symptoms worsen or fail to improve.    [name], MD

## 2023-05-18 ENCOUNTER — Other Ambulatory Visit: Payer: Self-pay | Admitting: Internal Medicine

## 2023-05-18 ENCOUNTER — Telehealth: Payer: Self-pay

## 2023-05-18 DIAGNOSIS — J22 Unspecified acute lower respiratory infection: Secondary | ICD-10-CM

## 2023-05-18 NOTE — Telephone Encounter (Signed)
 Left patient a detailed voice message to return call to office.

## 2023-05-18 NOTE — Telephone Encounter (Signed)
 Copied from CRM [id number]. Topic: Clinical - Medication Question >> May 18, 2023 11:33 AM Gerardine PARAS wrote: Reason for CRM: Patient called in regarding a z pack refill, states Rosina ordered the first one and she needs another as soon as possible. Asked to be called back at [phone]

## 2023-05-20 ENCOUNTER — Ambulatory Visit: Payer: Medicare PPO

## 2023-05-20 DIAGNOSIS — R058 Other specified cough: Secondary | ICD-10-CM

## 2023-05-20 DIAGNOSIS — R0989 Other specified symptoms and signs involving the circulatory and respiratory systems: Secondary | ICD-10-CM | POA: Diagnosis not present

## 2023-05-20 DIAGNOSIS — R059 Cough, unspecified: Secondary | ICD-10-CM | POA: Diagnosis not present

## 2023-06-03 DIAGNOSIS — J4551 Severe persistent asthma with (acute) exacerbation: Secondary | ICD-10-CM | POA: Diagnosis not present

## 2023-06-07 ENCOUNTER — Other Ambulatory Visit: Payer: Self-pay | Admitting: Family Medicine

## 2023-06-07 DIAGNOSIS — E118 Type 2 diabetes mellitus with unspecified complications: Secondary | ICD-10-CM

## 2023-06-22 DIAGNOSIS — R262 Difficulty in walking, not elsewhere classified: Secondary | ICD-10-CM | POA: Diagnosis not present

## 2023-06-22 DIAGNOSIS — R531 Weakness: Secondary | ICD-10-CM | POA: Diagnosis not present

## 2023-06-28 ENCOUNTER — Encounter (HOSPITAL_BASED_OUTPATIENT_CLINIC_OR_DEPARTMENT_OTHER): Payer: Self-pay

## 2023-06-28 ENCOUNTER — Telehealth: Payer: Self-pay | Admitting: Family Medicine

## 2023-06-28 ENCOUNTER — Emergency Department (HOSPITAL_BASED_OUTPATIENT_CLINIC_OR_DEPARTMENT_OTHER)
Admission: EM | Admit: 2023-06-28 | Discharge: 2023-06-28 | Disposition: A | Discharge status: Home / Self Care | Payer: Medicare PPO | Attending: Emergency Medicine | Admitting: Emergency Medicine

## 2023-06-28 ENCOUNTER — Other Ambulatory Visit: Payer: Self-pay

## 2023-06-28 DIAGNOSIS — J3081 Allergic rhinitis due to animal (cat) (dog) hair and dander: Secondary | ICD-10-CM | POA: Diagnosis not present

## 2023-06-28 DIAGNOSIS — Z853 Personal history of malignant neoplasm of breast: Secondary | ICD-10-CM | POA: Insufficient documentation

## 2023-06-28 DIAGNOSIS — Z7982 Long term (current) use of aspirin: Secondary | ICD-10-CM | POA: Insufficient documentation

## 2023-06-28 DIAGNOSIS — R0602 Shortness of breath: Secondary | ICD-10-CM | POA: Diagnosis not present

## 2023-06-28 DIAGNOSIS — E119 Type 2 diabetes mellitus without complications: Secondary | ICD-10-CM | POA: Insufficient documentation

## 2023-06-28 DIAGNOSIS — J4520 Mild intermittent asthma, uncomplicated: Secondary | ICD-10-CM | POA: Diagnosis not present

## 2023-06-28 DIAGNOSIS — Z7984 Long term (current) use of oral hypoglycemic drugs: Secondary | ICD-10-CM | POA: Insufficient documentation

## 2023-06-28 DIAGNOSIS — J3089 Other allergic rhinitis: Secondary | ICD-10-CM | POA: Diagnosis not present

## 2023-06-28 DIAGNOSIS — I498 Other specified cardiac arrhythmias: Secondary | ICD-10-CM | POA: Diagnosis not present

## 2023-06-28 DIAGNOSIS — J45909 Unspecified asthma, uncomplicated: Secondary | ICD-10-CM | POA: Diagnosis not present

## 2023-06-28 DIAGNOSIS — I499 Cardiac arrhythmia, unspecified: Secondary | ICD-10-CM | POA: Diagnosis not present

## 2023-06-28 DIAGNOSIS — J301 Allergic rhinitis due to pollen: Secondary | ICD-10-CM | POA: Diagnosis not present

## 2023-06-28 LAB — BASIC METABOLIC PANEL
Anion gap: 6 (ref 5–15)
BUN: 13 mg/dL (ref 8–23)
CO2: 31 mmol/L (ref 22–32)
Calcium: 9.2 mg/dL (ref 8.9–10.3)
Chloride: 102 mmol/L (ref 98–111)
Creatinine, Ser: 0.7 mg/dL (ref 0.44–1.00)
GFR, Estimated: >60 mL/min (ref >60)
Glucose, Bld: 176 mg/dL — ABNORMAL HIGH (ref 70–99)
Potassium: 3.9 mmol/L (ref 3.5–5.1)
Sodium: 139 mmol/L (ref 135–145)

## 2023-06-28 LAB — CBC WITH DIFFERENTIAL/PLATELET
Abs Immature Granulocytes: 0.02 10*3/uL (ref 0.00–0.07)
Basophils Absolute: 0 10*3/uL (ref 0.0–0.1)
Basophils Relative: 0 %
Eosinophils Absolute: 0.2 10*3/uL (ref 0.0–0.5)
Eosinophils Relative: 2 %
HCT: 37.1 % (ref 36.0–46.0)
Hemoglobin: 12.3 g/dL (ref 12.0–15.0)
Immature Granulocytes: 0 %
Lymphocytes Relative: 20 %
Lymphs Abs: 2.1 10*3/uL (ref 0.7–4.0)
MCH: 24.7 pg — ABNORMAL LOW (ref 26.0–34.0)
MCHC: 33.2 g/dL (ref 30.0–36.0)
MCV: 74.6 fL — ABNORMAL LOW (ref 80.0–100.0)
Monocytes Absolute: 0.8 10*3/uL (ref 0.1–1.0)
Monocytes Relative: 7 %
Neutro Abs: 7.1 10*3/uL (ref 1.7–7.7)
Neutrophils Relative %: 71 %
Platelets: 254 10*3/uL (ref 150–400)
RBC: 4.97 MIL/uL (ref 3.87–5.11)
RDW: 15.4 % (ref 11.5–15.5)
WBC: 10.3 10*3/uL (ref 4.0–10.5)
nRBC: 0 % (ref 0.0–0.2)

## 2023-06-28 NOTE — Discharge Instructions (Addendum)
 EKG here with a sinus arrhythmia is unchanged from previous EKGs.  Your oxygen saturations here are excellent they are in the upper 90s.  Lungs clear.  Would recommend using your albuterol  inhaler at home and then starting the steroids as per recommendation of your allergist.  Return for any new or worse symptoms.  Would recommend that you follow-up with your cardiology.

## 2023-06-28 NOTE — ED Provider Notes (Signed)
 [place] EMERGENCY DEPARTMENT AT [facility] Provider Note   CSN: [id number] Arrival date & time: 06/28/23  1417     History  Chief Complaint  Patient presents with   Irregular Heart Beat    Bianca Medina is a 73 y.o. female.  Patient sent in from allergy with concerns for low sats and irregular heartbeat.  Patient is followed by them for asthma.  Patient while walking outside yesterday and on Friday experienced some burning and some shortness of breath that normally reminds her of her asthma.  They got concerned because her heart rate was irregular.  And apparently they got a low oxygen saturation.  Patient's ox saturation on arrival was 96.  Currently she is 99% on room air.  Temp 98.1 pulse 66 respiratory 16 blood pressure 139/73.  Patient supposedly has a heart murmur and according to old EKGs does have sinus arrhythmia.  Has been followed by cardiology in the past.  Her allergist was planning on calling in a steroid.  Patient does not have her albuterol  inhaler with her.  But she has that and a nebulizer at home.  Past medical history sniffing for sleep disorder hyperlipidemia and diabetes common migraine history of breast cancer right breast.  Had abdominal hysterectomy.  Patient states she is got a history of heart murmur.  But not able to hear that today.  Patient really denies any chest pain.  Patient thinks it feels like her asthma.       Home Medications Prior to Admission medications   Medication Sig Start Date End Date Taking? Authorizing Provider  acetaminophen  (TYLENOL ) 500 MG tablet Take 500 mg by mouth every 6 (six) hours as needed for moderate pain.    [provider]  aspirin  EC 81 MG tablet Take 81 mg by mouth daily. Swallow whole.    [provider]  atorvastatin  (LIPITOR) 10 MG tablet TAKE 1 TABLET BY MOUTH EVERY DAY 02/14/23   Webb, Padonda B, FNP  benzonatate  (TESSALON ) 200 MG capsule Take 1 capsule (200 mg total) by mouth 2 (two)  times daily as needed for cough. 05/13/23   Tonna Frederic, MD  Biotin 5000 MCG CAPS Take 5,000 mcg by mouth daily.     [provider]  celecoxib  (CELEBREX ) 200 MG capsule Take 1 capsule (200 mg total) by mouth 2 (two) times daily between meals as needed. 09/02/22   Arnie Lao, MD  chlorthalidone  (HYGROTON ) 25 MG tablet TAKE 1 TABLET (25 MG TOTAL) BY MOUTH DAILY. 09/14/22   Tonna Frederic, MD  diclofenac  Sodium (VOLTAREN ) 1 % GEL Apply 2 g topically 4 (four) times daily. 09/30/22   Bronson Canny, PA-C  fluticasone  (FLONASE ) 50 MCG/ACT nasal spray Place 2 sprays into both nostrils daily. 12/24/22   Tonna Frederic, MD  ipratropium-albuterol  (DUONEB) 0.5-2.5 (3) MG/3ML SOLN Take 3 mLs by nebulization every 6 (six) hours as needed (wheezing). 04/29/23   Catheryn Cluck, MD  losartan  (COZAAR ) 25 MG tablet Take 25 mg by mouth daily. 07/23/21   [provider]  metFORMIN  (GLUCOPHAGE -XR) 500 MG 24 hr tablet TAKE 1 TABLET BY MOUTH 2 TIMES DAILY WITH A MEAL. 06/07/23   Tonna Frederic, MD  Potassium Chloride  ER 20 MEQ TBCR Take 1 tablet by mouth daily. 11/10/21   [provider]  predniSONE  (DELTASONE ) 50 MG tablet Take 1 tablet daily for 5 days. 04/29/23   Catheryn Cluck, MD  PROAIR  HFA 108 [phone] Base) MCG/ACT inhaler Inhale  1-2 puffs into the lungs every 4 (four) hours as needed for wheezing or shortness of breath.  03/16/18   [provider]  SYMBICORT 80-4.5 MCG/ACT inhaler Inhale 2 puffs into the lungs 2 (two) times daily. 12/08/21   [provider]  triamcinolone  cream (KENALOG ) 0.1 % Apply topically 2 (two) times daily. 05/13/22   [provider]  Vitamin D , Ergocalciferol , (DRISDOL ) 1.25 MG (50000 UNIT) CAPS capsule TAKE 1 CAPSULE BY MOUTH ONE TIME PER WEEK 10/22/22   Tonna Frederic, MD  vitamin E 1000 UNIT capsule Take 1,000 Units by mouth daily.    [provider]      Allergies    Codeine     Review of Systems   Review of Systems  Constitutional:  Negative for chills and fever.  HENT:  Negative for ear pain and sore throat.   Eyes:  Negative for pain and visual disturbance.  Respiratory:  Positive for shortness of breath and wheezing. Negative for cough.   Cardiovascular:  Negative for chest pain and palpitations.  Gastrointestinal:  Negative for abdominal pain and vomiting.  Genitourinary:  Negative for dysuria and hematuria.  Musculoskeletal:  Negative for arthralgias and back pain.  Skin:  Negative for color change and rash.  Neurological:  Negative for seizures and syncope.  All other systems reviewed and are negative.   Physical Exam Updated Vital Signs BP (!) 123/55   Pulse 61   Temp 98.1 F (36.7 C)   Resp 18   Ht 1.651 m (5\' 5" )   Wt 121.1 kg   SpO2 95%   BMI 44.43 kg/m  Physical Exam Vitals and nursing note reviewed.  Constitutional:      General: She is not in acute distress.    Appearance: Normal appearance. She is well-developed. She is not ill-appearing.  HENT:     Head: Normocephalic and atraumatic.  Eyes:     Extraocular Movements: Extraocular movements intact.     Conjunctiva/sclera: Conjunctivae normal.  Cardiovascular:     Rate and Rhythm: Normal rate. Rhythm irregular.     Heart sounds: No murmur heard. Pulmonary:     Effort: Pulmonary effort is normal. No respiratory distress.     Breath sounds: Normal breath sounds. No wheezing, rhonchi or rales.  Abdominal:     Palpations: Abdomen is soft.     Tenderness: There is no abdominal tenderness.  Musculoskeletal:        General: No swelling.     Cervical back: Normal range of motion and neck supple.  Skin:    General: Skin is warm and dry.     Capillary Refill: Capillary refill takes less than 2 seconds.  Neurological:     General: No focal deficit present.     Mental Status: She is alert and oriented to person, place, and time.  Psychiatric:        Mood and Affect: Mood normal.      ED Results / Procedures / Treatments   Labs (all labs ordered are listed, but only abnormal results are displayed) Labs Reviewed  CBC WITH DIFFERENTIAL/PLATELET - Abnormal; Notable for the following components:      Result Value   MCV 74.6 (*)    MCH 24.7 (*)    All other components within normal limits  BASIC METABOLIC PANEL - Abnormal; Notable for the following components:   Glucose, Bld 176 (*)    All other components within normal limits    EKG EKG Interpretation Date/Time:  Monday  June 28 2023 14:47:41 EST Ventricular Rate:  61 PR Interval:  168 QRS Duration:  86 QT Interval:  402 QTC Calculation: 404 R Axis:   38  Text Interpretation: Sinus rhythm with marked sinus arrhythmia Possible Left atrial enlargement Anterolateral infarct , age undetermined Abnormal ECG When compared with ECG of 18-Jan-2023 10:15, PREVIOUS ECG IS PRESENT No significant change since last tracing Confirmed by Emmry Hinsch [id number]) on 06/28/2023 3:34:30 PM  Radiology No results found.  Procedures Procedures    Medications Ordered in ED Medications - No data to display  ED Course/ Medical Decision Making/ A&P                                 Medical Decision Making Amount and/or Complexity of Data Reviewed Labs: ordered.   Patient's CBC white count 10.3 hemoglobin 12.3 platelets 254.  Basic metabolic panel normal except for glucose of 176.  Patient does have known history of diabetes.  Renal function is normal.  EKG here shows sinus arrhythmia she is got EKGs from the past that have shown something similar.  No wheezing currently.  Did offer chest x-ray albuterol  treatment here.  But patient really wants to go home.  She feels that this was all part of her asthma.  Patient denies any significant chest pain but does talk about some burning more in her lungs and of her chest area.  Patient comfortable currently.  No wheezing here.  Patient will follow-up with cardiology.  Sounds as  if her allergist is going to call in some steroids.  Oxygen saturations here in the upper 90s.  Cardiac monitoring consistent with a sinus arrhythmia.   Final Clinical Impression(s) / ED Diagnoses Final diagnoses:  Sinus arrhythmia  Shortness of breath    Rx / DC Orders ED Discharge Orders     None         Nicklas Barns, MD 06/28/23 1649

## 2023-06-28 NOTE — ED Notes (Signed)
 Discharge paperwork given and verbally understood.

## 2023-06-28 NOTE — Telephone Encounter (Signed)
 Agree with plan, potential new onset afib with shortness of breath and chest pain.

## 2023-06-28 NOTE — ED Triage Notes (Signed)
 Sent by PCP for low heart rate. Denies chest pain, dizziness or lightheadedness. Reports some intermittent nausea.

## 2023-06-28 NOTE — Telephone Encounter (Signed)
 Received call on clinic access line from E2C[employer] agent. She advised [facility] on the line stating pt in office in A-fib. 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 I accepted transfer. I spoke with Lupe with [facility]. She states pt in office with lungs burning and SOB. Dr. Tilmon Font not available. Per Rheba Cedar, DNP advised that pt proceed to ER. Lupe to notify provider of advise of PCP office.

## 2023-06-29 ENCOUNTER — Telehealth: Payer: Self-pay

## 2023-06-29 DIAGNOSIS — R262 Difficulty in walking, not elsewhere classified: Secondary | ICD-10-CM | POA: Diagnosis not present

## 2023-06-29 DIAGNOSIS — R531 Weakness: Secondary | ICD-10-CM | POA: Diagnosis not present

## 2023-06-29 NOTE — Transitions of Care (Post Inpatient/ED Visit) (Signed)
   06/29/2023  Name: Bianca Medina MRN: [id number] DOB: 1950/10/27  Today's TOC FU Call Status: Today's TOC FU Call Status:: Unsuccessful Call (1st Attempt) Unsuccessful Call (1st Attempt) Date: 06/29/23  Attempted to reach the patient regarding the most recent Inpatient/ED visit.  Follow Up Plan: Additional outreach attempts will be made to reach the patient to complete the Transitions of Care (Post Inpatient/ED visit) call.   Signature Karena Addison, LPN [facility] Nurse Health Advisor Direct Dial [phone]

## 2023-07-01 ENCOUNTER — Encounter: Payer: Self-pay | Admitting: Family Medicine

## 2023-07-01 ENCOUNTER — Ambulatory Visit: Payer: Medicare PPO | Admitting: Family Medicine

## 2023-07-01 VITALS — BP 148/88 | HR 76 | Temp 98.0°F | Ht 65.0 in | Wt 270.0 lb

## 2023-07-01 DIAGNOSIS — E118 Type 2 diabetes mellitus with unspecified complications: Secondary | ICD-10-CM

## 2023-07-01 DIAGNOSIS — E7800 Pure hypercholesterolemia, unspecified: Secondary | ICD-10-CM

## 2023-07-01 DIAGNOSIS — I10 Essential (primary) hypertension: Secondary | ICD-10-CM | POA: Diagnosis not present

## 2023-07-01 DIAGNOSIS — M25562 Pain in left knee: Secondary | ICD-10-CM | POA: Diagnosis not present

## 2023-07-01 DIAGNOSIS — R600 Localized edema: Secondary | ICD-10-CM | POA: Diagnosis not present

## 2023-07-01 DIAGNOSIS — M1711 Unilateral primary osteoarthritis, right knee: Secondary | ICD-10-CM | POA: Diagnosis not present

## 2023-07-01 MED ORDER — FUROSEMIDE 20 MG PO TABS
20.0000 mg | ORAL_TABLET | Freq: Every day | ORAL | 1 refills | Status: DC
Start: 2023-07-01 — End: 2023-09-06

## 2023-07-01 NOTE — Progress Notes (Signed)
Established Patient Office Visit   Subjective:  Patient ID: Bianca Medina, female    DOB: 1950/07/30  Age: 73 y.o. MRN: [id number]  Chief Complaint  Patient presents with   Hospitalization Follow-up    Follow up ER visit for Sinus arrhythmia. Pt requested Lasix for lower leg edema.     HPI Encounter Diagnoses  Name Primary?   Essential hypertension Yes   Elevated LDL cholesterol level    Type 2 diabetes mellitus with complication, without long-term current use of insulin (HCC)    Bilateral lower extremity edema    For follow-up of above.  Seen recently in the emergency room for sinus arrhythmia that was not demonstrated on EKG.  She does have a history of sinus arrhythmia and is status post cardiology workup.  She  is having no shortness of breath or chest pain.  Blood pressure is usually in the 130s over 70s to 80s she tells me.  She is compliant with her losartan and chlorthalidone.  She is recently developed some swelling in her lower extremities.  She has been consuming more fast food and prepared foods.  Claims compliance with her metformin 500 mg twice daily.  She assures me that she rarely misses a dose.   Review of Systems  Constitutional: Negative.   HENT: Negative.    Eyes:  Negative for blurred vision, discharge and redness.  Respiratory: Negative.  Negative for shortness of breath.   Cardiovascular: Negative.  Negative for chest pain and palpitations.  Gastrointestinal:  Negative for abdominal pain.  Genitourinary: Negative.   Musculoskeletal: Negative.  Negative for myalgias.  Skin:  Negative for rash.  Neurological:  Negative for tingling, loss of consciousness and weakness.  Endo/Heme/Allergies:  Negative for polydipsia.     Current Outpatient Medications:    acetaminophen (TYLENOL) 500 MG tablet, Take 500 mg by mouth every 6 (six) hours as needed for moderate pain., Disp: , Rfl:    aspirin EC 81 MG tablet, Take 81 mg by mouth daily. Swallow whole., Disp: ,  Rfl:    atorvastatin (LIPITOR) 10 MG tablet, TAKE 1 TABLET BY MOUTH EVERY DAY, Disp: 90 tablet, Rfl: 3   benzonatate (TESSALON) 200 MG capsule, Take 1 capsule (200 mg total) by mouth 2 (two) times daily as needed for cough., Disp: 20 capsule, Rfl: 0   Biotin 5000 MCG CAPS, Take 5,000 mcg by mouth daily. , Disp: , Rfl:    celecoxib (CELEBREX) 200 MG capsule, Take 1 capsule (200 mg total) by mouth 2 (two) times daily between meals as needed., Disp: 60 capsule, Rfl: 3   chlorthalidone (HYGROTON) 25 MG tablet, TAKE 1 TABLET (25 MG TOTAL) BY MOUTH DAILY., Disp: 90 tablet, Rfl: 1   diclofenac Sodium (VOLTAREN) 1 % GEL, Apply 2 g topically 4 (four) times daily., Disp: 350 g, Rfl: 1   fluticasone (FLONASE) 50 MCG/ACT nasal spray, Place 2 sprays into both nostrils daily., Disp: 16 g, Rfl: 6   furosemide (LASIX) 20 MG tablet, Take 1 tablet (20 mg total) by mouth daily., Disp: 7 tablet, Rfl: 1   ipratropium-albuterol (DUONEB) 0.5-2.5 (3) MG/3ML SOLN, Take 3 mLs by nebulization every 6 (six) hours as needed (wheezing)., Disp: 360 mL, Rfl: 0   losartan (COZAAR) 25 MG tablet, Take 25 mg by mouth daily., Disp: , Rfl:    metFORMIN (GLUCOPHAGE-XR) 500 MG 24 hr tablet, TAKE 1 TABLET BY MOUTH 2 TIMES DAILY WITH A MEAL., Disp: 180 tablet, Rfl: 1   Potassium Chloride ER 20  MEQ TBCR, Take 1 tablet by mouth daily., Disp: , Rfl:    predniSONE (DELTASONE) 50 MG tablet, Take 1 tablet daily for 5 days., Disp: 5 tablet, Rfl: 0   PROAIR HFA 108 (90 Base) MCG/ACT inhaler, Inhale 1-2 puffs into the lungs every 4 (four) hours as needed for wheezing or shortness of breath. , Disp: , Rfl:    SYMBICORT 80-4.5 MCG/ACT inhaler, Inhale 2 puffs into the lungs 2 (two) times daily., Disp: , Rfl:    triamcinolone cream (KENALOG) 0.1 %, Apply topically 2 (two) times daily., Disp: , Rfl:    Vitamin D, Ergocalciferol, (DRISDOL) 1.25 MG (50000 UNIT) CAPS capsule, TAKE 1 CAPSULE BY MOUTH ONE TIME PER WEEK, Disp: 12 capsule, Rfl: 2   Objective:      BP (!) 148/88   Pulse 76   Temp 98 F (36.7 C)   Ht 5\' 5"  (1.651 m)   Wt 270 lb (122.5 kg)   SpO2 97%   BMI 44.93 kg/m  BP Readings from Last 3 Encounters:  07/01/23 (!) 148/88  06/28/23 (!) 142/77  05/13/23 130/76   Wt Readings from Last 3 Encounters:  07/01/23 270 lb (122.5 kg)  06/28/23 267 lb (121.1 kg)  05/13/23 268 lb 9.6 oz (121.8 kg)      Physical Exam Constitutional:      General: She is not in acute distress.    Appearance: Normal appearance. She is not ill-appearing, toxic-appearing or diaphoretic.  HENT:     Head: Normocephalic and atraumatic.     Right Ear: External ear normal.     Left Ear: External ear normal.     Mouth/Throat:     Mouth: Mucous membranes are moist.     Pharynx: Oropharynx is clear. No oropharyngeal exudate or posterior oropharyngeal erythema.  Eyes:     General: No scleral icterus.       Right eye: No discharge.        Left eye: No discharge.     Extraocular Movements: Extraocular movements intact.     Conjunctiva/sclera: Conjunctivae normal.     Pupils: Pupils are equal, round, and reactive to light.  Cardiovascular:     Rate and Rhythm: Normal rate and regular rhythm. Occasional Extrasystoles are present. Pulmonary:     Effort: Pulmonary effort is normal. No respiratory distress.     Breath sounds: Normal breath sounds. No wheezing or rales.  Musculoskeletal:     Cervical back: No rigidity or tenderness.  Skin:    General: Skin is warm and dry.  Neurological:     Mental Status: She is alert and oriented to person, place, and time.  Psychiatric:        Mood and Affect: Mood normal.        Behavior: Behavior normal.      No results found for any visits on 07/01/23.    The 10-year ASCVD risk score (Arnett DK, et al., 2019) is: 28.5%    Assessment & Plan:   Essential hypertension  Elevated LDL cholesterol level  Type 2 diabetes mellitus with complication, without long-term current use of insulin (HCC) -      Hemoglobin A1c  Bilateral lower extremity edema -     Furosemide; Take 1 tablet (20 mg total) by mouth daily.  Dispense: 7 tablet; Refill: 1    Return in about 8 weeks (around 08/26/2023), or Return for labs and in 8 weeks. Be sure to take all your medications as directed., for Return for labs and in  8 weeks.  Be sure to take all your medications as directed..  Information was given on managing hypertension.  She will try to manage her sodium intake.  May need to adjust medications.   [name], MD

## 2023-07-01 NOTE — Transitions of Care (Post Inpatient/ED Visit) (Signed)
   07/01/2023  Name: Bianca Medina MRN: [id number] DOB: April 20, 1951  Today's TOC FU Call Status: Today's TOC FU Call Status:: Unsuccessful Call (2nd Attempt) Unsuccessful Call (1st Attempt) Date: 06/29/23 Unsuccessful Call (2nd Attempt) Date: 07/01/23  Attempted to reach the patient regarding the most recent Inpatient/ED visit.  Follow Up Plan: Additional outreach attempts will be made to reach the patient to complete the Transitions of Care (Post Inpatient/ED visit) call.   Signature Karena Addison, LPN [facility] Nurse Health Advisor Direct Dial [phone]

## 2023-07-02 ENCOUNTER — Other Ambulatory Visit (INDEPENDENT_AMBULATORY_CARE_PROVIDER_SITE_OTHER): Payer: Medicare PPO

## 2023-07-02 DIAGNOSIS — E118 Type 2 diabetes mellitus with unspecified complications: Secondary | ICD-10-CM | POA: Diagnosis not present

## 2023-07-02 DIAGNOSIS — E1100 Type 2 diabetes mellitus with hyperosmolarity without nonketotic hyperglycemic-hyperosmolar coma (NKHHC): Secondary | ICD-10-CM

## 2023-07-02 LAB — HEMOGLOBIN A1C: Hgb A1c MFr Bld: 7.7 % — ABNORMAL HIGH (ref 4.6–6.5)

## 2023-07-04 DIAGNOSIS — J4551 Severe persistent asthma with (acute) exacerbation: Secondary | ICD-10-CM | POA: Diagnosis not present

## 2023-07-05 NOTE — Transitions of Care (Post Inpatient/ED Visit) (Signed)
   07/05/2023  Name: Bianca Medina MRN: [id number] DOB: Dec 29, 1950 Patient already seen in office Today's TOC FU Call Status: Today's TOC FU Call Status:: Unsuccessful Call (2nd Attempt) Unsuccessful Call (1st Attempt) Date: 06/29/23 Unsuccessful Call (2nd Attempt) Date: 07/01/23  Attempted to reach the patient regarding the most recent Inpatient/ED visit.  Follow Up Plan: No further outreach attempts will be made at this time. We have been unable to contact the patient.  Signature Karena Addison, LPN [facility] Nurse Health Advisor Direct Dial [phone]

## 2023-07-06 DIAGNOSIS — R531 Weakness: Secondary | ICD-10-CM | POA: Diagnosis not present

## 2023-07-06 DIAGNOSIS — R262 Difficulty in walking, not elsewhere classified: Secondary | ICD-10-CM | POA: Diagnosis not present

## 2023-07-12 DIAGNOSIS — E7800 Pure hypercholesterolemia, unspecified: Secondary | ICD-10-CM | POA: Diagnosis not present

## 2023-07-12 DIAGNOSIS — Z23 Encounter for immunization: Secondary | ICD-10-CM | POA: Diagnosis not present

## 2023-07-12 DIAGNOSIS — R002 Palpitations: Secondary | ICD-10-CM | POA: Diagnosis not present

## 2023-07-12 DIAGNOSIS — R0602 Shortness of breath: Secondary | ICD-10-CM | POA: Diagnosis not present

## 2023-07-12 DIAGNOSIS — I10 Essential (primary) hypertension: Secondary | ICD-10-CM | POA: Diagnosis not present

## 2023-07-12 DIAGNOSIS — E119 Type 2 diabetes mellitus without complications: Secondary | ICD-10-CM | POA: Diagnosis not present

## 2023-07-13 DIAGNOSIS — R531 Weakness: Secondary | ICD-10-CM | POA: Diagnosis not present

## 2023-07-13 DIAGNOSIS — R262 Difficulty in walking, not elsewhere classified: Secondary | ICD-10-CM | POA: Diagnosis not present

## 2023-07-20 DIAGNOSIS — R262 Difficulty in walking, not elsewhere classified: Secondary | ICD-10-CM | POA: Diagnosis not present

## 2023-07-20 DIAGNOSIS — R531 Weakness: Secondary | ICD-10-CM | POA: Diagnosis not present

## 2023-07-28 DIAGNOSIS — M7502 Adhesive capsulitis of left shoulder: Secondary | ICD-10-CM | POA: Diagnosis not present

## 2023-08-01 DIAGNOSIS — J4551 Severe persistent asthma with (acute) exacerbation: Secondary | ICD-10-CM | POA: Diagnosis not present

## 2023-08-05 DIAGNOSIS — C50411 Malignant neoplasm of upper-outer quadrant of right female breast: Secondary | ICD-10-CM | POA: Diagnosis not present

## 2023-08-05 DIAGNOSIS — N644 Mastodynia: Secondary | ICD-10-CM | POA: Diagnosis not present

## 2023-08-05 DIAGNOSIS — R928 Other abnormal and inconclusive findings on diagnostic imaging of breast: Secondary | ICD-10-CM | POA: Diagnosis not present

## 2023-08-05 DIAGNOSIS — R92323 Mammographic fibroglandular density, bilateral breasts: Secondary | ICD-10-CM | POA: Diagnosis not present

## 2023-08-09 ENCOUNTER — Ambulatory Visit: Payer: Self-pay

## 2023-08-09 DIAGNOSIS — C50111 Malignant neoplasm of central portion of right female breast: Secondary | ICD-10-CM | POA: Diagnosis not present

## 2023-08-09 DIAGNOSIS — L299 Pruritus, unspecified: Secondary | ICD-10-CM | POA: Diagnosis not present

## 2023-08-09 NOTE — Telephone Encounter (Signed)
 Chief Complaint: Headache Symptoms: Moderate headache, sneezing, itchy nose and eyes Frequency: Constant  Pertinent Negatives: Patient denies nausea, vomiting, fever, migraine Disposition: [] ED /[] Urgent Care (no appt availability in office) / [x] Appointment(In office/virtual)/ []  [facility]/ [] Home Care/ [] Refused Recommended Disposition /[] [facility]/ []  Follow-up with PCP Additional Notes: Patient states she has had a headache for about 1 week and it does not feel like her typical migraine. Patient states she is not sure if it allergy related because she has had some sneezing and itchy eyes and nose recently.  Care advice was given and patient has been scheduled for an appointment tomorrow afternoon.  Copied from CRM [id number]. Topic: Clinical - Red Word Triage >> Aug 09, 2023  3:03 PM Pascal Lux wrote: Red Word that prompted transfer to Nurse Triage: Patient stated she's been having really bad headaches since last week and almost went to the ER. Reason for Disposition  [1] MILD-MODERATE headache AND [2] present > 72 hours  Answer Assessment - Initial Assessment Questions 1. LOCATION: "Where does it hurt?"      Left side to the center  2. ONSET: "When did the headache start?" (Minutes, hours or days)      Last week Tuesday or Wednesday  3. PATTERN: "Does the pain come and go, or has it been constant since it started?"     Constant  4. SEVERITY: "How bad is the pain?" and "What does it keep you from doing?"  (e.g., Scale 1-10; mild, moderate, or severe)   - MILD (1-3): doesn't interfere with normal activities    - MODERATE (4-7): interferes with normal activities or awakens from sleep    - SEVERE (8-10): excruciating pain, unable to do any normal activities        6/10 5. RECURRENT SYMPTOM: "Have you ever had headaches before?" If Yes, ask: "When was the last time?" and "What happened that time?"      Yes, I have migraine  6. CAUSE: "What do you think is causing the  headache?"     Maybe allergies  7. MIGRAINE: "Have you been diagnosed with migraine headaches?" If Yes, ask: "Is this headache similar?"      Yes  8. HEAD INJURY: "Has there been any recent injury to the head?"      No  9. OTHER SYMPTOMS: "Do you have any other symptoms?" (fever, stiff neck, eye pain, sore throat, cold symptoms)     Sneezing, itchy eyes and nose  Protocols used: Headache-A-AH

## 2023-08-10 ENCOUNTER — Ambulatory Visit: Admitting: Internal Medicine

## 2023-08-10 ENCOUNTER — Encounter: Payer: Self-pay | Admitting: Internal Medicine

## 2023-08-10 VITALS — BP 160/82 | HR 81 | Temp 98.4°F | Ht 65.0 in | Wt 270.8 lb

## 2023-08-10 DIAGNOSIS — I10 Essential (primary) hypertension: Secondary | ICD-10-CM | POA: Diagnosis not present

## 2023-08-10 DIAGNOSIS — J301 Allergic rhinitis due to pollen: Secondary | ICD-10-CM

## 2023-08-10 DIAGNOSIS — R519 Headache, unspecified: Secondary | ICD-10-CM

## 2023-08-10 NOTE — Progress Notes (Signed)
 [facility] [address] [place] [address] Dept: [phone] Dept Fax: [phone]  Acute Care Office Visit  Subjective:   Bianca Medina 01-May-1951 08/10/2023  Chief Complaint  Patient presents with   Headache    Headaches started last Tuesday off and on and got worse Taking 2 Ibuprofen helping with pain Patient reports stop taking all medication weeks ago    HPI: Discussed the use of AI scribe software for clinical note transcription with the patient, who gave verbal consent to proceed.  History of Present Illness   The patient, with a history of migraines, allergies, asthma, and hypertension, presents with a headache of one week duration. The headache, initially intermittent, has become constant and is not relieved by Tylenol. The patient has found some relief with two ibuprofen tablets twice daily. The headache is primarily located in the left frontal region, but has also been felt in the right frontal region. The patient describes the headache as sharp at times and achy. She denies associated vomiting, vision changes, and exacerbating factors, aside from the wear off of the ibuprofen.  The patient also reports difficulty with her leg, which she believes may be due to a previous stroke. This difficulty has been ongoing and is not new with the onset of the headaches. The patient recently decided to stop taking all of her medications. She has been off all medications for about 2 weeks. She restarted chlorthalidone and losartan for hypertension and an unspecified allergy medication yesterday.   She does report recent increase in allergy symptoms such as itchy/watery eyes, sneezing, runny nose.      The following portions of the patient's history were reviewed and updated as appropriate: past medical history, past surgical history, family history, social history, allergies, medications, and problem list.   Patient Active  Problem List   Diagnosis Date Noted   Recurrent cough 05/13/2023   Severe persistent asthma with exacerbation 04/29/2023   DM hyperosmolarity type II, uncontrolled (HCC) 02/08/2023   Hypersomnia 02/08/2023   Non-restorative sleep 02/08/2023   Venous stasis of both lower extremities 09/04/2022   Arthritis of right knee 09/04/2022   Follow-up medical care requested by patient 09/04/2022   Concerned about care plan 09/04/2022   Vitamin E deficiency 07/22/2022   Leg cramps 07/22/2022   Facial droop 06/16/2022   Pain of left upper extremity 06/16/2022   Right knee pain 06/11/2022   Mood disorder (HCC) 06/11/2022   Diverticulitis 04/26/2022   Cellulitis 04/26/2022   Leukocytosis 04/26/2022   Invasive ductal carcinoma of breast (HCC) 04/26/2022   Type 2 diabetes mellitus with complication, without long-term current use of insulin (HCC) 04/26/2022   Slow transit constipation 01/26/2022   Left leg swelling 11/10/2021   Hyperlipidemia 11/10/2021   Bruising 06/11/2021   Other schizoaffective disorders (HCC) 04/30/2021   Thyroid nodule 04/30/2021   Screening for colon cancer 04/01/2021   Insect bite of right wrist 04/01/2021   Bite, insect 04/01/2021   Rash 03/28/2021   Iron deficiency 03/19/2021   Need for influenza vaccination 03/19/2021   Snoring 01/27/2021   Sleep related bruxism 01/27/2021   Gasping for breath 01/27/2021   Insomnia due to anxiety and fear 01/27/2021   Class 3 severe obesity due to excess calories with serious comorbidity and body mass index (BMI) of 40.0 to 44.9 in adult [facility]) 01/27/2021   Medicare annual wellness visit, subsequent 01/24/2021   Dehydration 12/26/2020   History of syncope 12/26/2020   Hospital discharge  follow-up 12/26/2020   Healthcare maintenance 12/01/2020   Vitamin D deficiency 12/01/2020   Ear itching 06/27/2019   Common migraine 06/23/2019   History of anemia 03/31/2019   Pre-diabetes 03/31/2019   Viral syndrome 03/31/2019    Sensorineural hearing loss (SNHL), bilateral 08/12/2017   Tinnitus of both ears 08/12/2017   Morbid obesity (HCC) 12/28/2016   Left breast mass 07/19/2014   Breast tenderness in female 07/19/2014   Menopause 01/09/2014   Vaginal atrophy 01/09/2014   Skin lesion 12/23/2012   Essential hypertension 10/31/2012   Heartburn 10/31/2012   Past Medical History:  Diagnosis Date   Adenomatous colon polyp 2021   Anemia 2003   IRON DEFICIENCY    Asthma    Breast cancer (HCC)    right breast   Chronic anal fissure    Common migraine 06/23/2019   Diabetes mellitus without complication (HCC)    GERD (gastroesophageal reflux disease)    Hearing loss    Hyperlipidemia, mild    Hypertension    IDA (iron deficiency anemia)    NSVD (normal spontaneous vaginal delivery)    X3   Obesity    PMDD (premenstrual dysphoric disorder)    Sensorineural hearing loss (SNHL) of both ears    Sleep disorder    Past Surgical History:  Procedure Laterality Date   ABDOMINAL HYSTERECTOMY     BLADDER REPAIR     BREAST BIOPSY Right    2023   COLONOSCOPY  11/2019   1 hpp and 1 tubl adenoma   COLONOSCOPY WITH PROPOFOL N/A 12/03/2021   Procedure: COLONOSCOPY WITH PROPOFOL;  Surgeon: Earline Mayotte, MD;  Location: [facility] ENDOSCOPY;  Service: Endoscopy;  Laterality: N/A;   GUM SURGERY  1980's   MANDIBLE FRACTURE SURGERY     TUBAL LIGATION     Family History  Problem Relation Age of Onset   Cancer Mother        LUNG- SMOKER   Diabetes Father    Hypertension Father    Heart disease Father    Stroke Father    Breast cancer Sister        lates 21s   Cancer Paternal Aunt        COLON   Cancer Paternal Uncle        COLON    Current Outpatient Medications:    acetaminophen (TYLENOL) 500 MG tablet, Take 500 mg by mouth every 6 (six) hours as needed for moderate pain. (Patient not taking: Reported on 08/10/2023), Disp: , Rfl:    aspirin EC 81 MG tablet, Take 81 mg by mouth daily. Swallow whole. (Patient  not taking: Reported on 08/10/2023), Disp: , Rfl:    atorvastatin (LIPITOR) 10 MG tablet, TAKE 1 TABLET BY MOUTH EVERY DAY (Patient not taking: Reported on 08/10/2023), Disp: 90 tablet, Rfl: 3   benzonatate (TESSALON) 200 MG capsule, Take 1 capsule (200 mg total) by mouth 2 (two) times daily as needed for cough. (Patient not taking: Reported on 08/10/2023), Disp: 20 capsule, Rfl: 0   Biotin 5000 MCG CAPS, Take 5,000 mcg by mouth daily.  (Patient not taking: Reported on 08/10/2023), Disp: , Rfl:    celecoxib (CELEBREX) 200 MG capsule, Take 1 capsule (200 mg total) by mouth 2 (two) times daily between meals as needed. (Patient not taking: Reported on 08/10/2023), Disp: 60 capsule, Rfl: 3   chlorthalidone (HYGROTON) 25 MG tablet, TAKE 1 TABLET (25 MG TOTAL) BY MOUTH DAILY. (Patient not taking: Reported on 08/10/2023), Disp: 90 tablet, Rfl: 1  diclofenac Sodium (VOLTAREN) 1 % GEL, Apply 2 g topically 4 (four) times daily. (Patient not taking: Reported on 08/10/2023), Disp: 350 g, Rfl: 1   fluticasone (FLONASE) 50 MCG/ACT nasal spray, Place 2 sprays into both nostrils daily. (Patient not taking: Reported on 08/10/2023), Disp: 16 g, Rfl: 6   furosemide (LASIX) 20 MG tablet, Take 1 tablet (20 mg total) by mouth daily. (Patient not taking: Reported on 08/10/2023), Disp: 7 tablet, Rfl: 1   ipratropium-albuterol (DUONEB) 0.5-2.5 (3) MG/3ML SOLN, Take 3 mLs by nebulization every 6 (six) hours as needed (wheezing). (Patient not taking: Reported on 08/10/2023), Disp: 360 mL, Rfl: 0   losartan (COZAAR) 25 MG tablet, Take 25 mg by mouth daily. (Patient not taking: Reported on 08/10/2023), Disp: , Rfl:    metFORMIN (GLUCOPHAGE-XR) 500 MG 24 hr tablet, TAKE 1 TABLET BY MOUTH 2 TIMES DAILY WITH A MEAL. (Patient not taking: Reported on 08/10/2023), Disp: 180 tablet, Rfl: 1   Potassium Chloride ER 20 MEQ TBCR, Take 1 tablet by mouth daily. (Patient not taking: Reported on 08/10/2023), Disp: , Rfl:    predniSONE (DELTASONE) 50 MG  tablet, Take 1 tablet daily for 5 days. (Patient not taking: Reported on 08/10/2023), Disp: 5 tablet, Rfl: 0   PROAIR HFA 108 (90 Base) MCG/ACT inhaler, Inhale 1-2 puffs into the lungs every 4 (four) hours as needed for wheezing or shortness of breath.  (Patient not taking: Reported on 08/10/2023), Disp: , Rfl:    SYMBICORT 80-4.5 MCG/ACT inhaler, Inhale 2 puffs into the lungs 2 (two) times daily. (Patient not taking: Reported on 08/10/2023), Disp: , Rfl:    triamcinolone cream (KENALOG) 0.1 %, Apply topically 2 (two) times daily. (Patient not taking: Reported on 08/10/2023), Disp: , Rfl:    Vitamin D, Ergocalciferol, (DRISDOL) 1.25 MG (50000 UNIT) CAPS capsule, TAKE 1 CAPSULE BY MOUTH ONE TIME PER WEEK (Patient not taking: Reported on 08/10/2023), Disp: 12 capsule, Rfl: 2 Allergies  Allergen Reactions   Codeine Other (See Comments)    nightmares  Other Reaction(s): Other (See Comments)    nightmares     ROS: A complete ROS was performed with pertinent positives/negatives noted in the HPI. The remainder of the ROS are negative.    Objective:   Today's Vitals   08/10/23 1456 08/10/23 1528  BP: (!) 164/82 (!) 160/82  Pulse: 81   Temp: 98.4 F (36.9 C)   TempSrc: Temporal   SpO2: 95%   Weight: 270 lb 12.8 oz (122.8 kg)   Height: 5\' 5"  (1.651 m)    BP Readings from Last 3 Encounters:  08/10/23 (!) 160/82  07/01/23 (!) 148/88  06/28/23 (!) 142/77     GENERAL: Well-appearing, in NAD. Well nourished.  SKIN: Pink, warm and dry. No rash, lesion, ulceration, or ecchymoses.  NECK: Trachea midline. Full ROM w/o pain or tenderness. No lymphadenopathy.  RESPIRATORY: Chest wall symmetrical. Respirations even and non-labored. Breath sounds clear to auscultation bilaterally.  CARDIAC: S1, S2 present, regular rate and rhythm. Peripheral pulses 2+ bilaterally.  EXTREMITIES: Without clubbing, cyanosis, or edema.  NEUROLOGIC: CN 2-12 intact. No motor or sensory deficits. Steady, even gait.   PSYCH/MENTAL STATUS: Alert, oriented x 3. Cooperative, appropriate mood and affect.    No results found for any visits on 08/10/23.    Assessment & Plan:  Assessment and Plan    Headache Headaches likely multifactorial, related to hypertension, allergies, and medication withdrawal after abruptly stopping all medication.  - Restart all medications, including chlorthalidone and losartan for  BP management. - Monitor blood pressure at home. - Follow up in a week with Doctor Doreene Burke for BP recheck.   Hypertension Blood pressure elevated at 164/82 mmHg due to medication non-compliance, contributing to headaches. - Restart chlorthalidone and losartan. - Consider increasing losartan if blood pressure remains elevated. - Monitor blood pressure at home. - Follow up in a week with Doctor Doreene Burke.  Allergic Rhinitis Allergies may exacerbate headache symptoms. - Continue allergy medication as previously prescribed.       No orders of the defined types were placed in this encounter.  No orders of the defined types were placed in this encounter.  Lab Orders  No laboratory test(s) ordered today   No images are attached to the encounter or orders placed in the encounter.  Return in about 1 week (around 08/17/2023) for Blood Pressure re-check with Dr. Doreene Burke.   Salvatore Decent, FNP

## 2023-08-10 NOTE — Patient Instructions (Signed)
Restart medications as prescribed

## 2023-08-11 DIAGNOSIS — R531 Weakness: Secondary | ICD-10-CM | POA: Diagnosis not present

## 2023-08-11 DIAGNOSIS — R262 Difficulty in walking, not elsewhere classified: Secondary | ICD-10-CM | POA: Diagnosis not present

## 2023-08-12 ENCOUNTER — Other Ambulatory Visit: Payer: Self-pay | Admitting: Family Medicine

## 2023-08-12 DIAGNOSIS — M25561 Pain in right knee: Secondary | ICD-10-CM

## 2023-08-16 DIAGNOSIS — E119 Type 2 diabetes mellitus without complications: Secondary | ICD-10-CM | POA: Diagnosis not present

## 2023-08-18 DIAGNOSIS — R531 Weakness: Secondary | ICD-10-CM | POA: Diagnosis not present

## 2023-08-18 DIAGNOSIS — M1712 Unilateral primary osteoarthritis, left knee: Secondary | ICD-10-CM | POA: Diagnosis not present

## 2023-08-18 DIAGNOSIS — M170 Bilateral primary osteoarthritis of knee: Secondary | ICD-10-CM | POA: Diagnosis not present

## 2023-08-18 DIAGNOSIS — M545 Low back pain, unspecified: Secondary | ICD-10-CM | POA: Diagnosis not present

## 2023-08-18 DIAGNOSIS — R262 Difficulty in walking, not elsewhere classified: Secondary | ICD-10-CM | POA: Diagnosis not present

## 2023-08-18 DIAGNOSIS — M1711 Unilateral primary osteoarthritis, right knee: Secondary | ICD-10-CM | POA: Diagnosis not present

## 2023-08-20 DIAGNOSIS — R531 Weakness: Secondary | ICD-10-CM | POA: Diagnosis not present

## 2023-08-20 DIAGNOSIS — R262 Difficulty in walking, not elsewhere classified: Secondary | ICD-10-CM | POA: Diagnosis not present

## 2023-08-23 ENCOUNTER — Emergency Department (HOSPITAL_COMMUNITY)
Admission: EM | Admit: 2023-08-23 | Discharge: 2023-08-23 | Disposition: A | Discharge status: Home / Self Care | Attending: Emergency Medicine | Admitting: Emergency Medicine

## 2023-08-23 ENCOUNTER — Other Ambulatory Visit: Payer: Self-pay

## 2023-08-23 ENCOUNTER — Emergency Department (HOSPITAL_COMMUNITY)

## 2023-08-23 ENCOUNTER — Encounter (HOSPITAL_COMMUNITY): Payer: Self-pay

## 2023-08-23 DIAGNOSIS — I771 Stricture of artery: Secondary | ICD-10-CM | POA: Diagnosis not present

## 2023-08-23 DIAGNOSIS — R519 Headache, unspecified: Secondary | ICD-10-CM | POA: Insufficient documentation

## 2023-08-23 DIAGNOSIS — Z7982 Long term (current) use of aspirin: Secondary | ICD-10-CM | POA: Insufficient documentation

## 2023-08-23 DIAGNOSIS — J320 Chronic maxillary sinusitis: Secondary | ICD-10-CM | POA: Diagnosis not present

## 2023-08-23 LAB — I-STAT CHEM 8, ED
BUN: 25 mg/dL — ABNORMAL HIGH (ref 8–23)
Calcium, Ion: 1.17 mmol/L (ref 1.15–1.40)
Chloride: 101 mmol/L (ref 98–111)
Creatinine, Ser: 0.9 mg/dL (ref 0.44–1.00)
Glucose, Bld: 131 mg/dL — ABNORMAL HIGH (ref 70–99)
HCT: 40 % (ref 36.0–46.0)
Hemoglobin: 13.6 g/dL (ref 12.0–15.0)
Potassium: 4.4 mmol/L (ref 3.5–5.1)
Sodium: 139 mmol/L (ref 135–145)
TCO2: 31 mmol/L (ref 22–32)

## 2023-08-23 MED ORDER — MELOXICAM 15 MG PO TABS: 15.0000 mg | ORAL_TABLET | Freq: Every day | ORAL | 0 refills | Status: AC

## 2023-08-23 MED ORDER — IOHEXOL 350 MG/ML SOLN
75.0000 mL | Freq: Once | INTRAVENOUS | Status: AC | PRN
  Administered 2023-08-23: 75 mL via INTRAVENOUS

## 2023-08-23 NOTE — ED Triage Notes (Signed)
 Pt states that she's been having intermittent L sided headache for the last week. Pt denies vision changes, unilateral weakness. Pt has hx of migraines.

## 2023-08-23 NOTE — ED Notes (Signed)
 Pt denies N/V, sensitivity to light

## 2023-08-23 NOTE — Discharge Instructions (Signed)
 Please continue to follow-up with the neurology appointment that your doctors have made for you.  Until then I would recommend that you take Mobic 1 tablet/day.  This may help prevent the headaches, do not take this with other anti-inflammatories such as ibuprofen or Celebrex.  You may also take Tylenol every 6 hours as needed.  Thank you for allowing [place] to treat you in the emergency department today.  After reviewing your examination and potential testing that was done it appears that you are safe to go home.  I would like for you to follow-up with your doctor within the next several days, have them obtain your records and follow-up with them to review all potential tests and results from your visit.  If you should develop severe or worsening symptoms return to the emergency department immediately

## 2023-08-23 NOTE — ED Provider Notes (Signed)
 [place] EMERGENCY DEPARTMENT AT [facility] Provider Note   CSN: [id number] Arrival date & time: 08/23/23  4098     History  Chief Complaint  Patient presents with   Headache    Bianca Medina is a 73 y.o. female.   Headache  This patient is a 73 year old female, she has a history of high cholesterol and is struggling with some orthopedic issues with arthritis and is currently in physical therapy.  She reports that over the last year she has had intermittent headaches which can be in any part of her head but tend to be more left-sided sharp and stabbing and lasting for several seconds to a minute, it is gotten worse over the last couple of weeks but is not associated with any changes in neurologic function including no numbness weakness difficulty ambulating or changes in vision or speech.  She was told to come to the hospital last week but decided not to.  Overnight and this morning she had some intermittent pain that seem to get worse so she decided to come this morning.  There is no vomiting though there is some sensitivity to light.  She reports being diagnosed with formal migraine disorder when she was younger but was many years ago.  She is taking intermittent ibuprofen with some relief, no fevers no chills no neck pain and no stiff neck.  The orthopedic service has referred her to neurology and she is supposed to be seen on April 21    Home Medications Prior to Admission medications   Medication Sig Start Date End Date Taking? Authorizing Provider  meloxicam (MOBIC) 15 MG tablet Take 1 tablet (15 mg total) by mouth daily for 14 days. 08/23/23 09/06/23 Yes Eber Hong, MD  acetaminophen (TYLENOL) 500 MG tablet Take 500 mg by mouth every 6 (six) hours as needed for moderate pain. Patient not taking: Reported on 08/10/2023    [provider]  aspirin EC 81 MG tablet Take 81 mg by mouth daily. Swallow whole. Patient not taking: Reported on 08/10/2023    [provider]  atorvastatin (LIPITOR) 10 MG tablet TAKE 1 TABLET BY MOUTH EVERY DAY Patient not taking: Reported on 08/10/2023 02/14/23   Eulis Foster, FNP  benzonatate (TESSALON) 200 MG capsule Take 1 capsule (200 mg total) by mouth 2 (two) times daily as needed for cough. Patient not taking: Reported on 08/10/2023 05/13/23   [name], MD  Biotin 5000 MCG CAPS Take 5,000 mcg by mouth daily.  Patient not taking: Reported on 08/10/2023    [provider]  celecoxib (CELEBREX) 200 MG capsule Take 1 capsule (200 mg total) by mouth 2 (two) times daily between meals as needed. Patient not taking: Reported on 08/10/2023 09/02/22   Kathryne Hitch, MD  chlorthalidone (HYGROTON) 25 MG tablet TAKE 1 TABLET (25 MG TOTAL) BY MOUTH DAILY. Patient not taking: Reported on 08/10/2023 09/14/22   [name], MD  diclofenac Sodium (VOLTAREN) 1 % GEL Apply 2 g topically 4 (four) times daily. Patient not taking: Reported on 08/10/2023 09/30/22   Kirtland Bouchard, PA-C  fluticasone [facility]) 50 MCG/ACT nasal spray Place 2 sprays into both nostrils daily. Patient not taking: Reported on 08/10/2023 12/24/22   [name], MD  furosemide (LASIX) 20 MG tablet Take 1 tablet (20 mg total) by mouth daily. Patient not taking: Reported on 08/10/2023 07/01/23   [name], MD  ipratropium-albuterol (DUONEB) 0.5-2.5 (3) MG/3ML SOLN Take 3 mLs  by nebulization every 6 (six) hours as needed (wheezing). Patient not taking: Reported on 08/10/2023 04/29/23   [name], MD  losartan (COZAAR) 25 MG tablet Take 25 mg by mouth daily. Patient not taking: Reported on 08/10/2023 07/23/21   [provider]  metFORMIN (GLUCOPHAGE-XR) 500 MG 24 hr tablet TAKE 1 TABLET BY MOUTH 2 TIMES DAILY WITH A MEAL. Patient not taking: Reported on 08/10/2023 06/07/23   [name], MD  Potassium Chloride ER 20 MEQ TBCR Take 1 tablet by mouth daily. Patient not taking: Reported  on 08/10/2023 11/10/21   [provider]  predniSONE (DELTASONE) 50 MG tablet Take 1 tablet daily for 5 days. Patient not taking: Reported on 08/10/2023 04/29/23   [name], MD  [facility] HFA [phone] Base) MCG/ACT inhaler Inhale 1-2 puffs into the lungs every 4 (four) hours as needed for wheezing or shortness of breath.  Patient not taking: Reported on 08/10/2023 03/16/18   [provider]  SYMBICORT 80-4.5 MCG/ACT inhaler Inhale 2 puffs into the lungs 2 (two) times daily. Patient not taking: Reported on 08/10/2023 12/08/21   [provider]  triamcinolone cream (KENALOG) 0.1 % Apply topically 2 (two) times daily. Patient not taking: Reported on 08/10/2023 05/13/22   [provider]  Vitamin D, Ergocalciferol, (DRISDOL) 1.25 MG (50000 UNIT) CAPS capsule TAKE 1 CAPSULE BY MOUTH ONE TIME PER WEEK Patient not taking: Reported on 08/10/2023 10/22/22   [name], MD      Allergies    Codeine    Review of Systems   Review of Systems  Neurological:  Positive for headaches.  All other systems reviewed and are negative.   Physical Exam Updated Vital Signs BP 137/65 (BP Location: Left Arm)   Pulse 62   Temp 98 F (36.7 C) (Temporal)   Resp 16   Ht 1.651 m (5\' 5" )   Wt 122.8 kg   SpO2 100%   BMI 45.05 kg/m  Physical Exam Vitals and nursing note reviewed.  Constitutional:      General: She is not in acute distress.    Appearance: She is well-developed.  HENT:     Head: Normocephalic and atraumatic.     Comments: There is no temporal tenderness on either side    Mouth/Throat:     Pharynx: No oropharyngeal exudate.  Eyes:     General: No scleral icterus.       Right eye: No discharge.        Left eye: No discharge.     Conjunctiva/sclera: Conjunctivae normal.     Pupils: Pupils are equal, round, and reactive to light.  Neck:     Thyroid: No thyromegaly.     Vascular: No JVD.  Cardiovascular:     Rate and Rhythm: Normal rate and  regular rhythm.     Heart sounds: Normal heart sounds. No murmur heard.    No friction rub. No gallop.  Pulmonary:     Effort: Pulmonary effort is normal. No respiratory distress.     Breath sounds: Normal breath sounds. No wheezing or rales.  Abdominal:     General: Bowel sounds are normal. There is no distension.     Palpations: Abdomen is soft. There is no mass.     Tenderness: There is no abdominal tenderness.  Musculoskeletal:        General: No tenderness. Normal range of motion.     Cervical back: Normal range of motion and neck supple.  Lymphadenopathy:  Cervical: No cervical adenopathy.  Skin:    General: Skin is warm and dry.     Findings: No erythema or rash.  Neurological:     Mental Status: She is alert.     Coordination: Coordination normal.     Comments: Speech is clear, cranial nerves III through XII are intact, memory is intact, strength is normal in all 4 extremities including grips and strength at the bilateral thighs, knees and ankles to extention and flexion, sensation is intact to light touch and pinprick in all 4 extremities. Coordination as tested by finger-nose-finger is normal, no limb ataxia. Normal gait, normal reflexes at the patellar tendons bilaterally  Psychiatric:        Behavior: Behavior normal.     ED Results / Procedures / Treatments   Labs (all labs ordered are listed, but only abnormal results are displayed) Labs Reviewed  I-STAT CHEM 8, ED - Abnormal; Notable for the following components:      Result Value   BUN 25 (*)    Glucose, Bld 131 (*)    All other components within normal limits    EKG None  Radiology CT ANGIO HEAD NECK W WO CM Result Date: 08/23/2023 CLINICAL DATA:  73 year old female with headache. EXAM: CT ANGIOGRAPHY HEAD AND NECK WITH AND WITHOUT CONTRAST TECHNIQUE: Multidetector CT imaging of the head and neck was performed using the standard protocol during bolus administration of intravenous contrast. Multiplanar CT  image reconstructions and MIPs were obtained to evaluate the vascular anatomy. Carotid stenosis measurements (when applicable) are obtained utilizing NASCET criteria, using the distal internal carotid diameter as the denominator. RADIATION DOSE REDUCTION: This exam was performed according to the departmental dose-optimization program which includes automated exposure control, adjustment of the mA and/or kV according to patient size and/or use of iterative reconstruction technique. CONTRAST:  75mL OMNIPAQUE IOHEXOL 350 MG/ML SOLN COMPARISON:  Head CT 12/19/2022.  Brain MRI 10/04/2022. FINDINGS: CT HEAD Brain: Cerebral volume remains normal for age. No midline shift, ventriculomegaly, mass effect, evidence of mass lesion, intracranial hemorrhage or evidence of cortically based acute infarction. Gray-white matter differentiation is within normal limits throughout the brain. Calvarium and skull base: Chronic bilateral anterior maxillary ORIF. No acute osseous abnormality identified. Paranasal sinuses: Chronic maxillary sinus mucoperiosteal thickening primarily on the right. Other Visualized paranasal sinuses and mastoids are stable and well aerated. Orbits: No acute orbit or scalp soft tissue finding. CTA NECK Skeleton: Ordinary cervical spine degeneration. Anterior maxillary ORIF. Upper chest: Negative. Other neck: Heterogeneously enlarged right thyroid This has been evaluated on previous imaging (ultrasound 08/05/2022). (Ref: J Am Coll Radiol. 2015 Feb;12(2): 143-50). Otherwise negative neck soft tissue spaces. Aortic arch: 4 vessel arch, left vertebral artery arises directly from the arch. Mild distal arch calcified plaque. Right carotid system: Mildly tortuous brachiocephalic artery and proximal right CCA. Negative right carotid bifurcation. Mild tortuosity distal to the bulb, no plaque or stenosis. Left carotid system: Similar tortuosity. No plaque or stenosis. Slightly beaded appearance of the vessel (series 17,  image 26). Vertebral arteries: Proximal right subclavian artery and right vertebral artery origin are normal. Right vertebral artery appears dominant, is tortuous and patent to the skull base with no plaque or stenosis. Non dominant left vertebral artery arises directly from the arch, is tortuous in the neck, remains non dominant but patent to the skull base with no plaque or stenosis identified. CTA HEAD Posterior circulation: Patent distal vertebral arteries, the left functionally terminates in PICA. Right vertebral primarily supplies the basilar.  Patent right PICA origin. No distal vertebral or basilar stenosis. Patent basilar artery, SCA origins. Fetal type right PCA origin. Left posterior communicating artery also present. Bilateral PCA branches are within normal limits. Mild irregularity of the distal left P2. Anterior circulation: Both ICA siphons are patent with mild tortuosity, no plaque or stenosis. Normal posterior communicating artery origins. Normal ophthalmic artery origins. Patent carotid termini. Normal MCA and ACA origins. Dominant left ACA A1. Normal anterior communicating artery. Bilateral ACA branches are within normal limits. MCA M1 segments and MCA bi/trifurcations appear patent without stenosis. Bilateral MCA branches are within normal limits. Venous sinuses: Patent. Anatomic variants: Multiple, non dominant left vertebral artery arises directly from the arch and functionally terminates in PICA. Fetal type PCA origins more so the right. Dominant left ACA A1. Review of the MIP images confirms the above findings IMPRESSION: 1. Negative CTA; no significant atherosclerosis or arterial stenosis in the head and neck. Questionable minimal cervical ICA Fibromuscular Dysplasia (FMD). 2. Normal for age CT appearance of the brain. 3. Chronic maxillary sinusitis.  Previous maxilla ORIF. Electronically Signed   By: Odessa Fleming M.D.   On: 08/23/2023 11:42    Procedures Procedures    Medications Ordered  in ED Medications  iohexol (OMNIPAQUE) 350 MG/ML injection 75 mL (75 mLs Intravenous Contrast Given 08/23/23 1116)    ED Course/ Medical Decision Making/ A&P                                 Medical Decision Making Amount and/or Complexity of Data Reviewed Radiology: ordered.  Risk Prescription drug management.    This patient presents to the ED for concern of headache differential diagnosis includes arteritis but she does not have any tenderness or changes in vision, this could be a cluster type headache with sharp and stabbing type pains, there is nothing ophthalmologic about it, there is no neurologic symptoms, could be migrainous, could be aneurysmal as well    Additional history obtained:  Additional history obtained from medical record External records from outside source obtained and reviewed including CT scan of the head obtained August 2024, because of headache, she had an empty sella noted to be possibly consistent with idiopathic intracranial hypertension.  She reports that she has not had any evaluation for this after that CT scan, there was no other abnormal findings other than chronic right maxillary sinusitis. MRI was obtained in May 2024 because of leg weakness and right upper extremity weakness, MRI of the brain at that time was normal, no acute findings   Lab Tests:  I Ordered, and personally interpreted labs.  The pertinent results include: Metabolic panel was unremarkable   Imaging Studies ordered:  I ordered imaging studies including see T head and angiograms I independently visualized and interpreted imaging which showed unremarkable, no aneurysms tumors or strokes I agree with the radiologist interpretation   Medicines ordered and prescription drug management:  I ordered medication including Mobic for home Reevaluation of the patient showed that the patient patient has no pain in the emergency department I have reviewed the patients home medicines and  have made adjustments as needed   Problem List / ED Course:  Patient informed of results, she is agreeable to discharge, vitals unremarkable, neurology follow-up is already been established   Social Determinants of Health:  None           Final Clinical Impression(s) / ED Diagnoses Final diagnoses:  Sharp headache    Rx / DC Orders ED Discharge Orders          Ordered    meloxicam (MOBIC) 15 MG tablet  Daily        08/23/23 1215              Eber Hong, MD 08/23/23 1217

## 2023-08-23 NOTE — ED Notes (Signed)
 Patient Alert and oriented to baseline. Stable and ambulatory to baseline. Patient verbalized understanding of the discharge instructions.  Patient belongings were taken by the patient.

## 2023-08-26 ENCOUNTER — Encounter: Payer: Self-pay | Admitting: Family Medicine

## 2023-08-26 ENCOUNTER — Telehealth: Payer: Self-pay

## 2023-08-26 ENCOUNTER — Ambulatory Visit: Admitting: Family Medicine

## 2023-08-26 VITALS — BP 138/84 | HR 78 | Temp 98.0°F | Ht 65.0 in | Wt 266.0 lb

## 2023-08-26 DIAGNOSIS — E7800 Pure hypercholesterolemia, unspecified: Secondary | ICD-10-CM | POA: Insufficient documentation

## 2023-08-26 DIAGNOSIS — I10 Essential (primary) hypertension: Secondary | ICD-10-CM | POA: Diagnosis not present

## 2023-08-26 DIAGNOSIS — E559 Vitamin D deficiency, unspecified: Secondary | ICD-10-CM

## 2023-08-26 LAB — LDL CHOLESTEROL, DIRECT: Direct LDL: 98 mg/dL

## 2023-08-26 LAB — VITAMIN D 25 HYDROXY (VIT D DEFICIENCY, FRACTURES): VITD: 44.68 ng/mL (ref 30.00–100.00)

## 2023-08-26 NOTE — Transitions of Care (Post Inpatient/ED Visit) (Signed)
 08/26/2023  Name: Bianca Medina MRN: [id number] DOB: 04-Sep-1950  Today's TOC FU Call Status: Today's TOC FU Call Status:: Successful TOC FU Call Completed Patient's Name and Date of Birth confirmed.  Transition Care Management Follow-up Telephone Call Discharge Facility: [facility]) Type of Discharge: Emergency Department How have you been since you were released from the hospital?: Better Any questions or concerns?: No  Items Reviewed: Did you receive and understand the discharge instructions provided?: Yes Medications obtained,verified, and reconciled?: Yes (Medications Reviewed) Any new allergies since your discharge?: No Dietary orders reviewed?: NA Do you have support at home?: No  Medications Reviewed Today: Medications Reviewed Today     Reviewed by Solon Palm, CMA (Certified Medical Assistant) on 08/26/23 at 1402  Med List Status: <None>   Medication Order Taking? Sig Documenting Provider Last Dose Status Informant  acetaminophen (TYLENOL) 500 MG tablet [id number] Yes Take 500 mg by mouth every 6 (six) hours as needed for moderate pain (pain score 4-6). [provider] Taking Active   aspirin EC 81 MG tablet [id number] Yes Take 81 mg by mouth daily. Swallow whole. [provider] Taking Active Self, Pharmacy Records  atorvastatin (LIPITOR) 10 MG tablet [id number] Yes TAKE 1 TABLET BY MOUTH EVERY DAY Worthy Rancher B, FNP Taking Active   benzonatate (TESSALON) 200 MG capsule [id number] No Take 1 capsule (200 mg total) by mouth 2 (two) times daily as needed for cough.  Patient not taking: Reported on 08/26/2023   Bianca Sax, MD Not Taking Active   Biotin 5000 MCG CAPS [id number] Yes Take 5,000 mcg by mouth daily. [provider] Taking Active Self, Pharmacy Records  celecoxib (CELEBREX) 200 MG capsule [id number] No Take 1 capsule (200 mg total) by mouth 2 (two) times daily between meals as needed.  Patient not taking: Reported on  08/26/2023   Bianca Hitch, MD Not Taking Active   chlorthalidone (HYGROTON) 25 MG tablet [id number] Yes TAKE 1 TABLET (25 MG TOTAL) BY MOUTH DAILY. Bianca Sax, MD Taking Active   diclofenac Sodium (VOLTAREN) 1 % GEL [id number] Yes Apply 2 g topically 4 (four) times daily. Kirtland Bouchard, PA-C Taking Active   fluticasone (FLONASE) 50 MCG/ACT nasal spray [id number] Yes Place 2 sprays into both nostrils daily. Bianca Sax, MD Taking Active   furosemide (LASIX) 20 MG tablet [id number] No Take 1 tablet (20 mg total) by mouth daily.  Patient not taking: Reported on 08/26/2023   Bianca Sax, MD Not Taking Active   ipratropium-albuterol (DUONEB) 0.5-2.5 (3) MG/3ML SOLN [id number] Yes Take 3 mLs by nebulization every 6 (six) hours as needed (wheezing). Bianca Gunner, MD Taking Active   losartan (COZAAR) 25 MG tablet [id number] Yes Take 25 mg by mouth daily. [provider] Taking Active Self, Pharmacy Records  meloxicam (MOBIC) 15 MG tablet [id number] Yes Take 1 tablet (15 mg total) by mouth daily for 14 days. Bianca Hong, MD Taking Active   metFORMIN (GLUCOPHAGE-XR) 500 MG 24 hr tablet [id number] Yes TAKE 1 TABLET BY MOUTH 2 TIMES DAILY WITH A MEAL. Bianca Sax, MD Taking Active   Potassium Chloride ER 20 MEQ TBCR [id number] Yes Take 1 tablet by mouth daily. [provider] Taking Active Self, Pharmacy Records  predniSONE (DELTASONE) 50 MG tablet [id number]  Take 1 tablet daily for 5 days.  Patient not taking: Reported on 08/10/2023   Bianca Gunner, MD  Active   [facility] HFA [phone]) MCG/ACT inhaler [id number]  Inhale 1-2 puffs into the lungs every 4 (four) hours as needed for wheezing or shortness of breath.   Patient not taking: Reported on 08/10/2023   [provider]  Active Self, Pharmacy Records  SYMBICORT 80-4.5 MCG/ACT inhaler [id number] Yes Inhale 2 puffs into the lungs 2 (two) times daily. [provider]  Taking Active Self, Pharmacy Records  triamcinolone cream (KENALOG) 0.1 % [id number] Yes Apply topically 2 (two) times daily. [provider] Taking Active   Vitamin D, Ergocalciferol, (DRISDOL) 1.25 MG (50000 UNIT) CAPS capsule [id number] Yes TAKE 1 CAPSULE BY MOUTH ONE TIME PER WEEK Bianca Sax, MD Taking Active             Home Care and Equipment/Supplies: Were Home Health Services Ordered?: NA Any new equipment or medical supplies ordered?: NA  Functional Questionnaire: Do you need assistance with bathing/showering or dressing?: No Do you need assistance with meal preparation?: No Do you need assistance with eating?: No Do you have difficulty maintaining continence: No Do you need assistance with getting out of bed/getting out of a chair/moving?: No Do you have difficulty managing or taking your medications?: No  Follow up appointments reviewed: PCP Follow-up appointment confirmed?: Yes Date of PCP follow-up appointment?: 08/26/23 Follow-up Provider: Dr. Doreene Burke [facility] Follow-up appointment confirmed?: No Do you need transportation to your follow-up appointment?: No Do you understand care options if your condition(s) worsen?: Yes-patient verbalized understanding    Bianca Medina, CMA

## 2023-08-26 NOTE — Progress Notes (Signed)
 Established Patient Office Visit   Subjective:  Patient ID: Bianca Medina, female    DOB: [birth year]  Age: 73 y.o. MRN: [id number]  Chief Complaint  Patient presents with   Hypertension    1 week blood pressure recheck. Pt state she did end up in the Er on 4/7 for headaches.    Hospitalization Follow-up    Headache prompt to go to ER on 4/7.     Hypertension Pertinent negatives include no blurred vision.   Encounter Diagnoses  Name Primary?   Essential hypertension Yes   Vitamin D deficiency    Elevated LDL cholesterol level    For follow-up of blood pressure that has been elevated recently.  She has been compliant with her medications.  She is having no issues taking them.  She claims compliance with her weekly high-dose vitamin D tablet.  History of stable nodule of right thyroid.  Saw Dr. Jesusita Oka who had a serious discussion about her hereto for untreated breast cancer.  He urged follow-up.  She assures me that she has been in communication with Dr. Evelena Asa about this matter.   Review of Systems  Constitutional: Negative.   HENT: Negative.    Eyes:  Negative for blurred vision, discharge and redness.  Respiratory: Negative.    Cardiovascular: Negative.   Gastrointestinal:  Negative for abdominal pain.  Genitourinary: Negative.   Musculoskeletal: Negative.  Negative for myalgias.  Skin:  Negative for rash.  Neurological:  Negative for tingling, loss of consciousness and weakness.  Endo/Heme/Allergies:  Negative for polydipsia.     Current Outpatient Medications:    acetaminophen (TYLENOL) 500 MG tablet, Take 500 mg by mouth every 6 (six) hours as needed for moderate pain (pain score 4-6)., Disp: , Rfl:    aspirin EC 81 MG tablet, Take 81 mg by mouth daily. Swallow whole., Disp: , Rfl:    atorvastatin (LIPITOR) 10 MG tablet, TAKE 1 TABLET BY MOUTH EVERY DAY, Disp: 90 tablet, Rfl: 3   Biotin 5000 MCG CAPS, Take 5,000 mcg by mouth daily., Disp: , Rfl:    chlorthalidone  (HYGROTON) 25 MG tablet, TAKE 1 TABLET (25 MG TOTAL) BY MOUTH DAILY., Disp: 90 tablet, Rfl: 1   diclofenac Sodium (VOLTAREN) 1 % GEL, Apply 2 g topically 4 (four) times daily., Disp: 350 g, Rfl: 1   fluticasone (FLONASE) 50 MCG/ACT nasal spray, Place 2 sprays into both nostrils daily., Disp: 16 g, Rfl: 6   ipratropium-albuterol (DUONEB) 0.5-2.5 (3) MG/3ML SOLN, Take 3 mLs by nebulization every 6 (six) hours as needed (wheezing)., Disp: 360 mL, Rfl: 0   losartan (COZAAR) 25 MG tablet, Take 25 mg by mouth daily., Disp: , Rfl:    meloxicam (MOBIC) 15 MG tablet, Take 1 tablet (15 mg total) by mouth daily for 14 days., Disp: 14 tablet, Rfl: 0   metFORMIN (GLUCOPHAGE-XR) 500 MG 24 hr tablet, TAKE 1 TABLET BY MOUTH 2 TIMES DAILY WITH A MEAL., Disp: 180 tablet, Rfl: 1   Potassium Chloride ER 20 MEQ TBCR, Take 1 tablet by mouth daily., Disp: , Rfl:    PROAIR HFA 108 (90 Base) MCG/ACT inhaler, Inhale 1-2 puffs into the lungs every 4 (four) hours as needed for wheezing or shortness of breath., Disp: , Rfl:    SYMBICORT 80-4.5 MCG/ACT inhaler, Inhale 2 puffs into the lungs 2 (two) times daily., Disp: , Rfl:    triamcinolone cream (KENALOG) 0.1 %, Apply topically 2 (two) times daily., Disp: , Rfl:    Vitamin D,  Ergocalciferol, (DRISDOL) 1.25 MG (50000 UNIT) CAPS capsule, TAKE 1 CAPSULE BY MOUTH ONE TIME PER WEEK, Disp: 12 capsule, Rfl: 2   benzonatate (TESSALON) 200 MG capsule, Take 1 capsule (200 mg total) by mouth 2 (two) times daily as needed for cough. (Patient not taking: Reported on 08/10/2023), Disp: 20 capsule, Rfl: 0   celecoxib (CELEBREX) 200 MG capsule, Take 1 capsule (200 mg total) by mouth 2 (two) times daily between meals as needed. (Patient not taking: Reported on 08/10/2023), Disp: 60 capsule, Rfl: 3   furosemide (LASIX) 20 MG tablet, Take 1 tablet (20 mg total) by mouth daily. (Patient not taking: Reported on 08/10/2023), Disp: 7 tablet, Rfl: 1   predniSONE (DELTASONE) 50 MG tablet, Take 1 tablet  daily for 5 days. (Patient not taking: Reported on 08/26/2023), Disp: 5 tablet, Rfl: 0   Objective:     BP 138/84 (BP Location: Left Arm, Patient Position: Sitting, Cuff Size: Large)   Pulse 78   Temp 98 F (36.7 C) (Temporal)   Ht 5\' 5"  (1.651 m)   Wt 266 lb (120.7 kg)   SpO2 94%   BMI 44.26 kg/m    Physical Exam Constitutional:      General: She is not in acute distress.    Appearance: Normal appearance. She is not ill-appearing, toxic-appearing or diaphoretic.  HENT:     Head: Normocephalic and atraumatic.     Right Ear: External ear normal.     Left Ear: External ear normal.     Mouth/Throat:     Mouth: Mucous membranes are moist.     Pharynx: Oropharynx is clear. No oropharyngeal exudate or posterior oropharyngeal erythema.  Eyes:     General: No scleral icterus.       Right eye: No discharge.        Left eye: No discharge.     Extraocular Movements: Extraocular movements intact.     Conjunctiva/sclera: Conjunctivae normal.     Pupils: Pupils are equal, round, and reactive to light.  Cardiovascular:     Rate and Rhythm: Normal rate and regular rhythm.  Pulmonary:     Effort: Pulmonary effort is normal. No respiratory distress.     Breath sounds: Normal breath sounds. No wheezing or rales.  Abdominal:     General: Bowel sounds are normal.     Tenderness: There is no abdominal tenderness. There is no guarding.  Musculoskeletal:     Cervical back: No rigidity or tenderness.  Lymphadenopathy:     Cervical: No cervical adenopathy.  Skin:    General: Skin is warm and dry.  Neurological:     Mental Status: She is alert and oriented to person, place, and time.  Psychiatric:        Mood and Affect: Mood normal.        Behavior: Behavior normal.      No results found for any visits on 08/26/23.    The 10-year ASCVD risk score (Arnett DK, et al., 2019) is: 25.5%    Assessment & Plan:   Essential hypertension  Vitamin D deficiency -     VITAMIN D 25  Hydroxy (Vit-D Deficiency, Fractures)  Elevated LDL cholesterol level -     LDL cholesterol, direct    Return in about 4 months (around 12/26/2023).  Continue current regimen for hypertension and elevated cholesterol.  Rechecking vitamin D and LDL levels.  [name], MD

## 2023-09-01 DIAGNOSIS — J4551 Severe persistent asthma with (acute) exacerbation: Secondary | ICD-10-CM | POA: Diagnosis not present

## 2023-09-02 ENCOUNTER — Other Ambulatory Visit: Payer: Self-pay | Admitting: Family Medicine

## 2023-09-02 DIAGNOSIS — E559 Vitamin D deficiency, unspecified: Secondary | ICD-10-CM

## 2023-09-05 ENCOUNTER — Encounter (HOSPITAL_COMMUNITY): Payer: Self-pay

## 2023-09-05 ENCOUNTER — Other Ambulatory Visit: Payer: Self-pay

## 2023-09-05 ENCOUNTER — Emergency Department (HOSPITAL_COMMUNITY)

## 2023-09-05 ENCOUNTER — Emergency Department (HOSPITAL_COMMUNITY)
Admission: EM | Admit: 2023-09-05 | Discharge: 2023-09-06 | Discharge status: Against Medical Advice | Attending: Emergency Medicine | Admitting: Emergency Medicine

## 2023-09-05 DIAGNOSIS — R2242 Localized swelling, mass and lump, left lower limb: Secondary | ICD-10-CM | POA: Diagnosis not present

## 2023-09-05 DIAGNOSIS — Z5321 Procedure and treatment not carried out due to patient leaving prior to being seen by health care provider: Secondary | ICD-10-CM | POA: Diagnosis not present

## 2023-09-05 DIAGNOSIS — S92352A Displaced fracture of fifth metatarsal bone, left foot, initial encounter for closed fracture: Secondary | ICD-10-CM | POA: Diagnosis not present

## 2023-09-05 DIAGNOSIS — M7732 Calcaneal spur, left foot: Secondary | ICD-10-CM | POA: Diagnosis not present

## 2023-09-05 DIAGNOSIS — M25572 Pain in left ankle and joints of left foot: Secondary | ICD-10-CM | POA: Insufficient documentation

## 2023-09-05 DIAGNOSIS — M19072 Primary osteoarthritis, left ankle and foot: Secondary | ICD-10-CM | POA: Diagnosis not present

## 2023-09-05 DIAGNOSIS — M7989 Other specified soft tissue disorders: Secondary | ICD-10-CM | POA: Insufficient documentation

## 2023-09-05 NOTE — ED Triage Notes (Signed)
 Pt states last night when she got out her car she was u/t put weight on her foot. Initially pt thought she stepped on something d/t the pain and swelling.   Pt says her foot no longer feel like something sticking into it but it is sore on the lateral left side especially near the ankle.

## 2023-09-05 NOTE — ED Provider Triage Note (Addendum)
 Emergency Medicine Provider Triage Evaluation Note  Bianca Medina , a 73 y.o. female  was evaluated in triage.  Pt complains of leg swelling, ankle pain.  Patient reports that she was getting out of her vehicle last night when she put her weight down her left foot and felt pain shoot through her foot.  She initially thought that she may have stepped on something but states that that feeling is gone away.  She still endorses some mild pain in the midfoot on the dorsum side.  Denies any tingling or numbness.  States that besides recent injury, no baseline increase in swelling of her leg.  Review of Systems  Positive: As above Negative: As above  Physical Exam  BP (!) 153/68   Pulse 85   Temp 98 F (36.7 C)   Resp 16   Ht 5\' 5"  (1.651 m)   Wt 117.9 kg   SpO2 94%   BMI 43.27 kg/m  Gen:   Awake, no distress   Resp:  Normal effort  MSK:   Moves extremities without difficulty, tenderness palpation in the dorsum of the midfoot of the left side, minimal ankle discomfort, no obvious bruising Other:    Medical Decision Making  Medically screening exam initiated at 2:11 PM.  Appropriate orders placed.  ROZINA POINTER was informed that the remainder of the evaluation will be completed by another provider, this initial triage assessment does not replace that evaluation, and the importance of remaining in the ED until their evaluation is complete.     Raymondo Garcialopez A, PA-C 09/05/23 1412    Ferdinand Revoir A, PA-C 09/05/23 [phone]

## 2023-09-05 NOTE — ED Notes (Signed)
 Patient did not respond when named called.

## 2023-09-06 ENCOUNTER — Ambulatory Visit: Admitting: Neurology

## 2023-09-06 ENCOUNTER — Encounter: Payer: Self-pay | Admitting: Neurology

## 2023-09-06 ENCOUNTER — Ambulatory Visit: Admitting: Podiatry

## 2023-09-06 VITALS — BP 155/79 | HR 75 | Ht 65.0 in | Wt 270.0 lb

## 2023-09-06 DIAGNOSIS — J45909 Unspecified asthma, uncomplicated: Secondary | ICD-10-CM | POA: Insufficient documentation

## 2023-09-06 DIAGNOSIS — K648 Other hemorrhoids: Secondary | ICD-10-CM | POA: Insufficient documentation

## 2023-09-06 DIAGNOSIS — K5904 Chronic idiopathic constipation: Secondary | ICD-10-CM | POA: Insufficient documentation

## 2023-09-06 DIAGNOSIS — K219 Gastro-esophageal reflux disease without esophagitis: Secondary | ICD-10-CM | POA: Insufficient documentation

## 2023-09-06 DIAGNOSIS — K629 Disease of anus and rectum, unspecified: Secondary | ICD-10-CM | POA: Insufficient documentation

## 2023-09-06 DIAGNOSIS — K601 Chronic anal fissure: Secondary | ICD-10-CM | POA: Insufficient documentation

## 2023-09-06 DIAGNOSIS — K625 Hemorrhage of anus and rectum: Secondary | ICD-10-CM | POA: Insufficient documentation

## 2023-09-06 DIAGNOSIS — Z800 Family history of malignant neoplasm of digestive organs: Secondary | ICD-10-CM | POA: Insufficient documentation

## 2023-09-06 DIAGNOSIS — D649 Anemia, unspecified: Secondary | ICD-10-CM | POA: Insufficient documentation

## 2023-09-06 DIAGNOSIS — S92355A Nondisplaced fracture of fifth metatarsal bone, left foot, initial encounter for closed fracture: Secondary | ICD-10-CM | POA: Diagnosis not present

## 2023-09-06 DIAGNOSIS — R141 Gas pain: Secondary | ICD-10-CM | POA: Insufficient documentation

## 2023-09-06 DIAGNOSIS — M7732 Calcaneal spur, left foot: Secondary | ICD-10-CM | POA: Insufficient documentation

## 2023-09-06 DIAGNOSIS — K600 Acute anal fissure: Secondary | ICD-10-CM | POA: Insufficient documentation

## 2023-09-06 DIAGNOSIS — R635 Abnormal weight gain: Secondary | ICD-10-CM | POA: Insufficient documentation

## 2023-09-06 NOTE — Progress Notes (Signed)
 09/06/2023 3:15 PM UPDATED information has been added to the PTA medication list.  Please re-address admission med rec navigator. Thank you,   Bianca Medina    [facility]  Provider:  Dr Bianca Medina, Sleep clinic.  Referring Provider: Dayne Even, MD Primary Care Physician:  Bianca Frederic, MD  Chief Complaint  Patient presents with   Gait Problem    Rm 1 alone Pt is well, reports she started having issues with L leg weakness and gait since 2022. Currently has a fracture of L foot.     HPI:  Bianca Medina is a 73 y.o. female and seen here upon referral from Dr. Dalldorf for a Consultation/ Evaluation of lower extremity weakness. I have seen this patient  for SLEEP NEUROLOGY and she has not followed my medical advice to finally undergo a sleep test or leave my care.  It was not her PCP who referred, but her Orthopedist, who is concerned about gait instability  weakness in the lower extremities.    This patient reports gradual onset of stiffness in the left foot- she reports he foot is stiff- not weak. She could  not rotate her foot and the ankle  over a period of 3 years.  Now she had an acute pain in her left foot , Saturday April  9th when she alighted from her car and when she felt a sudden pain- she did not stumble, not twist the ankle.  It felt as if she stepped on something , a pebble.  Worsening over night and  made appointment with podiatrist for this morning , before coming here.    She has been dx with a " chipped bone at the left foot ", walking now with a limp because of pain. Wears a boot she got from the podiatrist.  She is not walking straight and veering to the right, and she is feeling as if her left leg is shorter, not just stiffer.   She has reported various symptoms in the left body: Left sided problems, facial asymmetry onset in  4/ 2023.  This has been stabilized, not getting better , not getting worse. We also didn't have a  visit to  compare  pictures of her face from before.   She has been frequenting the ED , has had MRIs of the brain and cervical spine , CT angiogram - all normal, no CVA , no stenosis.    In the meantime she was diagnosed with a malignant tumor of the breast and she has not seen anybody for treatment ,  awaiting [facility] for second opinion as she has not believed the dx / questions the dx.        Bianca Medina is a 73 y.o. AA female patient and seen here as ED referred patient , but she has not seen PCP - 08-05-2018 , this time for a new problem of facial numbness, and she reports a slow progression of facial droop on the left side of the face.  This is not present here today in PM, and she has not made an appointment with her PCP.  She reports that the swelling the puffiness and the facial asymmetry is most noticeable in the morning.  I have noticed that she has more ptosis on the left than the right.  The left upper eyelid does straddles the pupil.  There may be a slight asymmetry in her smile that the nasal labial fold is actually better pronounced on the left side.  She was first presenting to ED on 07-14-2021, had an MRI brain.  I quote from her second emergency visit on 02 August 2021 when the patient was seen at [facility].  For some reason an EKG was done it was interpreted as showing possibly left ventricular hypertrophy.  The patient presents as a patient with diabetes mellitus, hypertension, hyperlipidemia, migraine history and with a chief complaint of left side left-sided facial numbness tingling and face facial asymmetry.  The left-sided facial numbness and tingling has been intermittent over the past weeks but on 17 March she woke up at 2 AM and noticed worsening in numbness or tingling of the left side.  She also felt that the vision was blurred but this affected both eyes.  By the time she was seen in the emergency room it was 10/5 and her symptoms had improved she  reports that the numbness radiates into the left side of her neck but there was no facial asymmetry noticed by the attending physician.  His evaluation shows no cranial nerve deficits no dysarthria or facial asymmetry.  There was also no extremity abnormality noted equal strength, normal range of motion and no abnormal extremity sensory.       A work-up on 2 on February 27 included an MRI this was her first emergency room visit and was unremarkable.  No evidence of stroke or demyelination there was no bleeding brain bleed or tumor. COMPARISON:  Same-day noncontrast CT head, MR head 07/20/2019      She whad j just been  seen in September 2022, at the time upon transfer of care ( indirectly per sleep consult ) from Dr Tilda Fogo for a sleep consultation , in relation to Headache . Chief concern according to patient :  ' only sometimes do I wake with a headache, not severe, and once I take tylenol  its goes away immediately. There is no nausea, no photophobia and no focal point that hurts. " She never got the HST , no follow up from sleep consult.  I have the pleasure of seeing Bianca Medina today, a right -handed Black or [place] American female with a possible sleep disorder.  She   has a past medical history of Anemia (2003), Asthma, Common migraine (06/23/2019), Diabetes mellitus without complication (HCC), GERD (gastroesophageal reflux disease), Hyperlipidemia, mild, Hypertension, NSVD (normal spontaneous vaginal delivery), PMDD (premenstrual dysphoric disorder), morbid obesity. Vertigo recently, tinnitus- on meclizine .  Bianca Medina had undergone a sleep study on December 12, 2013 [facility], under the guidance of Dr. Rosa Medina.  She had a really poor sleep efficiency the total recording time was 412 minutes and the total sleep time 174 minutes so this was 42.3% indicating insomnia.  For the time that he actually slept she had an AHI of 11/h these were all hypopneas no frank  apneas were seen.  She was described as snoring.  All hypopneas clustered in rem sleep.  This is most likely a manifestation of obesity hypoventilation and her lowest oxygen saturation was 80% SPO2 she spent 1.8 minutes with a oxygen saturation less than 88%.  She remained on room air at the course her apnea was so mild she was not split into a CPAP titration at night.  Her neurologic exam was entirely normal.  She carried a diagnosis of allergic rhinitis insulin  resistance, perimenstrual mood disorder and morbid obesity.     Today's Epworth sleepiness score was endorsed at only 5 points out  of 24 points in stark contrast to her Epworth score at the time of her sleep study from 7 years ago.  Then she was excessively daytime sleepy but she also attributed this to stress at her job.   She has since retired. Her blood pressure has been better controlled, but was still need to medicate.  PHQ indicated still depression to be present .      Review of Systems: Out of a complete 14 system review, the patient complains of only the following symptoms, and all other reviewed systems are negative.   Walking with pain, left ankle , lateral foot left.   Social History   Socioeconomic History   Marital status: Divorced    Spouse name: Not on file   Number of children: 3   Years of education: Medina   Highest education level: Not on file  Occupational History   Occupation: Interior and spatial designer: [employer]  Tobacco Use   Smoking status: Never   Smokeless tobacco: Never  Vaping Use   Vaping status: Never Used  Substance and Sexual Activity   Alcohol use: Not Currently   Drug use: Never   Sexual activity: Not Currently    Partners: Male    Birth control/protection: Surgical, Abstinence    Comment: HYSTERECTOMY-1st intercourse 17 yo-5 partners  Other Topics Concern   Not on file  Social History Narrative   Lives alone   Right handed     Caffeine use:  soda/tea couple times a week   Social Drivers of Corporate investment banker Strain: Low Risk  (03/31/2022)   Overall Financial Resource Strain (CARDIA)    Difficulty of Paying Living Expenses: Not hard at all  Food Insecurity: No Food Insecurity (04/27/2022)   Hunger Vital Sign    Worried About Running Out of Food in the Last Year: Never true    Ran Out of Food in the Last Year: Never true  Transportation Needs: No Transportation Needs (04/27/2022)   PRAPARE - Administrator, Civil Service (Medical): No    Lack of Transportation (Non-Medical): No  Physical Activity: Inactive (03/31/2022)   Exercise Vital Sign    Days of Exercise per Week: 0 days    Minutes of Exercise per Session: 0 min  Stress: No Stress Concern Present (03/31/2022)   [employer] of Occupational Health - Occupational Stress Questionnaire    Feeling of Stress : Not at all  Social Connections: Unknown (09/16/2021)   Received from [facility]   Social Network    Social Network: Not on file  Intimate Partner Violence: Not At Risk (04/27/2022)   Humiliation, Afraid, Rape, and Kick questionnaire    Fear of Current or Ex-Partner: No    Emotionally Abused: No    Physically Abused: No    Sexually Abused: No    Family History  Problem Relation Age of Onset   Cancer Mother        LUNG- SMOKER   Diabetes Father    Hypertension Father    Heart disease Father    Stroke Father    Breast cancer Sister        lates 39s   Cancer Paternal Aunt        COLON   Cancer Paternal Uncle        COLON    Past Medical History:  Diagnosis Date   Adenomatous colon polyp 2021   Anemia 2003   IRON  DEFICIENCY  Asthma    Breast cancer (HCC)    right breast   Chronic anal fissure    Common migraine 06/23/2019   Diabetes mellitus without complication (HCC)    GERD (gastroesophageal reflux disease)    Hearing loss    Hyperlipidemia, mild    Hypertension    IDA (iron  deficiency anemia)     NSVD (normal spontaneous vaginal delivery)    X3   Obesity    PMDD (premenstrual dysphoric disorder)    Sensorineural hearing loss (SNHL) of both ears    Sleep disorder     Past Surgical History:  Procedure Laterality Date   ABDOMINAL HYSTERECTOMY     BLADDER REPAIR     BREAST BIOPSY Right    2023   COLONOSCOPY  11/2019   1 hpp and 1 tubl adenoma   COLONOSCOPY WITH PROPOFOL  N/A 12/03/2021   Procedure: COLONOSCOPY WITH PROPOFOL ;  Surgeon: Marshall Skeeter, MD;  Location: [facility] ENDOSCOPY;  Service: Endoscopy;  Laterality: N/A;   GUM SURGERY  1980's   MANDIBLE FRACTURE SURGERY     TUBAL LIGATION      Current Outpatient Medications  Medication Sig Dispense Refill   acetaminophen  (TYLENOL ) 500 MG tablet Take 500 mg by mouth every 6 (six) hours as needed for moderate pain (pain score 4-6).     aspirin  EC 81 MG tablet Take 81 mg by mouth daily. Swallow whole.     atorvastatin  (LIPITOR) 10 MG tablet TAKE 1 TABLET BY MOUTH EVERY DAY 90 tablet 3   Biotin 5000 MCG CAPS Take 5,000 mcg by mouth daily.     chlorthalidone  (HYGROTON ) 25 MG tablet TAKE 1 TABLET (25 MG TOTAL) BY MOUTH DAILY. 90 tablet 1   diclofenac  Sodium (VOLTAREN ) 1 % GEL Apply 2 g topically 4 (four) times daily. 350 g 1   Fluocinolone Acetonide 0.01 % OIL Drops to affected ear(s) once or twice daily as needed for itching.     fluticasone  (FLONASE ) 50 MCG/ACT nasal spray Place 2 sprays into both nostrils daily. 16 g 6   ibuprofen  (ADVIL ) 600 MG tablet Take 600 mg by mouth every 8 (eight) hours as needed.     ipratropium-albuterol  (DUONEB) 0.5-2.5 (3) MG/3ML SOLN Take 3 mLs by nebulization every 6 (six) hours as needed (wheezing). 360 mL 0   losartan  (COZAAR ) 25 MG tablet Take 25 mg by mouth daily.     meloxicam  (MOBIC ) 15 MG tablet Take 1 tablet (15 mg total) by mouth daily for 14 days. 14 tablet 0   metFORMIN  (GLUCOPHAGE -XR) 500 MG 24 hr tablet TAKE 1 TABLET BY MOUTH 2 TIMES DAILY WITH A MEAL. 180 tablet 1   montelukast   (SINGULAIR ) 10 MG tablet TAKE 1 TABLET BY MOUTH EVERY DAY FOR 30 DAYS for 90     Potassium Chloride  ER 20 MEQ TBCR Take 1 tablet by mouth daily.     predniSONE  (DELTASONE ) 50 MG tablet Take 1 tablet daily for 5 days. 5 tablet 0   PROAIR  HFA 108 (90 Base) MCG/ACT inhaler Inhale 1-2 puffs into the lungs every 4 (four) hours as needed for wheezing or shortness of breath.     STATUS COVID-19/FLU A&B KIT TEST AS DIRECTED TODAY     SYMBICORT 80-4.5 MCG/ACT inhaler Inhale 2 puffs into the lungs 2 (two) times daily.     triamcinolone  cream (KENALOG ) 0.1 % Apply topically 2 (two) times daily.     Vitamin D , Ergocalciferol , (DRISDOL ) 1.25 MG (50000 UNIT) CAPS capsule TAKE 1 CAPSULE BY MOUTH  ONE TIME PER WEEK 12 capsule 2   No current facility-administered medications for this visit.    Allergies as of 09/06/2023 - Review Complete 09/06/2023  Allergen Reaction Noted   Codeine Other (See Comments) 10/07/2011    Vitals: BP (!) 155/79 Comment: manual  Pulse 75   Ht 5\' 5"  (1.651 m)   Wt 270 lb (122.5 kg)   BMI 44.93 kg/m  Last Weight:  Wt Readings from Last 1 Encounters:  09/06/23 270 lb (122.5 kg)   Last Height:   Ht Readings from Last 1 Encounters:  09/06/23 5\' 5"  (1.651 m)   Last BMI: @LASTBMI  Physical exam:  General: The patient is awake, alert and appears not in acute distress.  The patient is well groomed. Head: Normocephalic, atraumatic.  Neck is supple. No Goiter .   Neck circumference: 17 Cardiovascular:  Regular rate and palpable peripheral pulse:  Respiratory: clear to auscultation.  Mallampati3, Skin:  With evidence of edema, no rash,  Feet are flat, no arch, no capillary refill delay, warm to touch.  Trunk: BMI is morbidly obese.   and patient  has normal posture.   Neurologic exam :Mood and affect are appropriate.  Cranial nerves: Pupils are equal and briskly reactive to light. Funduscopic exam without  evidence of pallor or edema. Extraocular movements  in vertical  and horizontal planes intact and without nystagmus. Visual fields by finger perimetry are intact. Hearing to finger rub intact.  Facial sensation intact to fine touch. Facial motor strength is symmetric and tongue and uvula move midline.  Motor exam:   Normal tone and normal muscle bulk and symmetric normal strength in upper extremities. Grip Strength intact  Proximal strength of shoulder muscles and hip flexors were intact. She cannot walk on stairs, she feels the left knee may gove out. .  Sensory:  Fine touch and vibration were tested . Proprioception was tested in the upper extremities only and was  normal.  She reports an abnormal tingling sensation in the hamstrings .  At times it hurts.  Burning sensation.   Coordination: Rapid alternating movements in the fingers/hands were normal. Gait and station: Patient walked without assistive device , she feels she drags the left foot, the foot is puffy, flat, and she is able to stand on both feet tip toe,  Core Strength within normal limits. Stance is stable and of wide base.  Tandem deferred.   Deep tendon reflexes: in the  upper and lower extremities are symmetric . Babinski maneuver response is  downgoing.   Assessment: Total time for face to face interview and examination, for review of  images and laboratory testing, neurophysiology testing and pre-existing records, including out-of -network , was 45 minutes. Assessment is as follows here:  1)  apparently spontaneous bone injury to the left outside ankle, described to her a a chipped bone -  feet are swollen bilaterally, lost arch. But feet is warm and with normal capillary refill.  Was able to walk on tip toe, now wears a boot.  IMPRESSION: 1. Minimally displaced avulsion fracture at the base of the fifth metatarsal. 2. No acute fracture or dislocation at the ankle.  advanced osteoarthritis in both knees, left treated with steroid injection.  Morbid obesity.  2) this is unlikely a  central nervous system contribution to this symptom or to the waddling gait.   Plan:  Treatment plan and additional workup planned after today includes:   1)  NCV and EMG discussed, unlikely to offer any help.  Checking for  peroneus lesion , check for planta pedis, ankle pain.  Not a dermatomal distribution.   2)  no repeated MRI or CT needed.  30 defer further care to podiatrist.    INTERPRETING PHYSICIAN:   Bianca Banner, MD  [facility] Sleep Board certified by The [employer] of Sleep Medicine and Diplomate of the [employer] of Sleep Medicine. Board certified In Neurology through the ABPN, Fellow of the [employer] of Neurology.

## 2023-09-06 NOTE — Progress Notes (Signed)
  Subjective:  Patient ID: Bianca Medina, female    DOB: [birth year],   MRN: [id number]  Chief Complaint  Patient presents with   Foot Pain    Left foot/ankle pain  Pt stated that she went to the hospital yesterday and had xrays done on her foot but she wanted to have it checked out because it happened so quickly and she wasn't able to put pressure on her foot     73 y.o. female presents for concern as above. She relates she was stepping out of her car yesterday and had extreme pain. She did get x-rays in ED but could not stay so does not know results and no treatments . Denies any other pedal complaints. Denies n/v/f/c.   Past Medical History:  Diagnosis Date   Adenomatous colon polyp 2021   Anemia 2003   IRON  DEFICIENCY    Asthma    Breast cancer (HCC)    right breast   Chronic anal fissure    Common migraine 06/23/2019   Diabetes mellitus without complication (HCC)    GERD (gastroesophageal reflux disease)    Hearing loss    Hyperlipidemia, mild    Hypertension    IDA (iron  deficiency anemia)    NSVD (normal spontaneous vaginal delivery)    X3   Obesity    PMDD (premenstrual dysphoric disorder)    Sensorineural hearing loss (SNHL) of both ears    Sleep disorder     Objective:  Physical Exam: Vascular: DP/PT pulses 2/4 bilateral. CFT <3 seconds. Normal hair growth on digits. No edema.  Skin. No lacerations or abrasions bilateral feet.  Musculoskeletal: MMT 5/5 bilateral lower extremities in DF, PF, Inversion and Eversion. Deceased ROM in DF of ankle joint. Tender to base of fifth metatarsal with edema and eccymosis mild noted.  Neurological: Sensation intact to light touch.   Assessment:   1. Nondisplaced fracture of fifth metatarsal bone, left foot, initial encounter for closed fracture      Plan:  Patient was evaluated and treated and all questions answered. -Xrays reviewed. Small avulsion fracture of base of fifth metatarsal on left foot No displaced.   -Discussed treatement options for avulsion fifth metatarsal fracture; risks, alternatives, and benefits explained. -Dispensed CAM boot . Patient to wear at all times and instructed on use -Recommend protection, rest, ice, elevation daily until symptoms improve -anti-inflammatories as needed -Patient to return to office in 4 weeks for serial x-rays to assess healing  or sooner if condition worsens.   Jennefer Moats, DPM

## 2023-09-06 NOTE — Patient Instructions (Signed)
 IMPRESSION: 1. Minimally displaced avulsion fracture at the base of the fifth metatarsal. 2. No acute fracture or dislocation at the ankle.    Follow with podiatry.

## 2023-09-08 ENCOUNTER — Telehealth: Payer: Self-pay

## 2023-09-08 ENCOUNTER — Telehealth: Payer: Self-pay | Admitting: Family Medicine

## 2023-09-08 DIAGNOSIS — R262 Difficulty in walking, not elsewhere classified: Secondary | ICD-10-CM | POA: Diagnosis not present

## 2023-09-08 DIAGNOSIS — R531 Weakness: Secondary | ICD-10-CM | POA: Diagnosis not present

## 2023-09-08 DIAGNOSIS — Z789 Other specified health status: Secondary | ICD-10-CM

## 2023-09-08 NOTE — Telephone Encounter (Signed)
 Returned missed phone call; left VM for call back.

## 2023-09-08 NOTE — Telephone Encounter (Signed)
 Tiffany from [facility] calling with info about pts appt.  [phone]

## 2023-09-08 NOTE — Telephone Encounter (Addendum)
 Spoke with Tiffany at [facility]. Concerns regarding cognitive status. Pt called [facility] stating she did not believe that she had colonoscopy completed as she does not recall this encounter. Tiffany showed Ms. Odonnell the consent form that she signed, the procedure notes, her insurance verification, etc and she still does not believe their office when they tell her she has had a colonoscopy completed. Tiffany also reports pt dropping medications off to their office and asking them to update the medications in her chart.   I informed Tiffany that our office has had similar instances with the pt when her mental status appear to be altered. I have placed REF [id number] for the pt for RN Case Management and Social Work with hopes there is help for her. Unfortunately, if the pt refuses the services, there is nothing we can do. Tiffany asked that I relay the information she provided over to PCP.

## 2023-09-08 NOTE — Telephone Encounter (Unsigned)
 Copied from CRM [id number]. Topic: Clinical - Medication Question >> Sep 07, 2023  4:38 PM Jethro Morrison wrote: Reason for CRM: THE PATIENT IS CALLING REQUESTING DR [facility] OR HIS NURSE CONTACT HER IN REGARDS TO SOME MEDS SHE WAS PRESCRIBED. SHE WENT TO THE ER ON 04/20 BUT DID NOT STAY. SHE WANTS YOU ALL TO TELL HER WHERE TO SEND THE PRESCRIPTIONS TO BE TESTED. SHE THINKS THE MEDS ARE NOT THE ACTUAL MED SHE HAS BEEN TAKING, SHE IS STATING THE MEDS MAYBE CAUSING HER TO FEEL THE WAY SHE IS FEELING.

## 2023-09-09 NOTE — Telephone Encounter (Signed)
 LVM for pt to schedule an appointment with PCP to discuss concerns.  Patient needs to bring medication with her to the appointment if she schedules.

## 2023-09-13 ENCOUNTER — Other Ambulatory Visit: Payer: Self-pay

## 2023-09-13 ENCOUNTER — Emergency Department (HOSPITAL_COMMUNITY)
Admission: EM | Admit: 2023-09-13 | Discharge: 2023-09-13 | Disposition: A | Discharge status: Home / Self Care | Attending: Emergency Medicine | Admitting: Emergency Medicine

## 2023-09-13 ENCOUNTER — Encounter (HOSPITAL_COMMUNITY): Payer: Self-pay

## 2023-09-13 DIAGNOSIS — Z853 Personal history of malignant neoplasm of breast: Secondary | ICD-10-CM | POA: Diagnosis not present

## 2023-09-13 DIAGNOSIS — I10 Essential (primary) hypertension: Secondary | ICD-10-CM | POA: Insufficient documentation

## 2023-09-13 DIAGNOSIS — Z7982 Long term (current) use of aspirin: Secondary | ICD-10-CM | POA: Insufficient documentation

## 2023-09-13 DIAGNOSIS — Z79899 Other long term (current) drug therapy: Secondary | ICD-10-CM | POA: Diagnosis not present

## 2023-09-13 DIAGNOSIS — T7621XA Adult sexual abuse, suspected, initial encounter: Secondary | ICD-10-CM | POA: Diagnosis not present

## 2023-09-13 DIAGNOSIS — E119 Type 2 diabetes mellitus without complications: Secondary | ICD-10-CM | POA: Diagnosis not present

## 2023-09-13 DIAGNOSIS — J45909 Unspecified asthma, uncomplicated: Secondary | ICD-10-CM | POA: Insufficient documentation

## 2023-09-13 DIAGNOSIS — T7421XA Adult sexual abuse, confirmed, initial encounter: Secondary | ICD-10-CM | POA: Diagnosis not present

## 2023-09-13 DIAGNOSIS — Z7984 Long term (current) use of oral hypoglycemic drugs: Secondary | ICD-10-CM | POA: Insufficient documentation

## 2023-09-13 LAB — URINALYSIS, ROUTINE W REFLEX MICROSCOPIC
Bilirubin Urine: NEGATIVE
Glucose, UA: NEGATIVE mg/dL
Hgb urine dipstick: NEGATIVE
Ketones, ur: NEGATIVE mg/dL
Leukocytes,Ua: NEGATIVE
Nitrite: NEGATIVE
Protein, ur: NEGATIVE mg/dL
Specific Gravity, Urine: 1.02 (ref 1.005–1.030)
pH: 5 (ref 5.0–8.0)

## 2023-09-13 LAB — WET PREP, GENITAL
Clue Cells Wet Prep HPF POC: NONE SEEN
Sperm: NONE SEEN
Trich, Wet Prep: NONE SEEN
WBC, Wet Prep HPF POC: >=10 — AB (ref <10)
Yeast Wet Prep HPF POC: NONE SEEN
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
## 2023-09-13 NOTE — ED Notes (Signed)
 Pt provided discharge instructions and prescription information. Pt was given the opportunity to ask questions and questions were answered.
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
## 2023-09-14 ENCOUNTER — Other Ambulatory Visit: Payer: Self-pay | Admitting: Family Medicine

## 2023-09-14 ENCOUNTER — Telehealth: Payer: Self-pay

## 2023-09-14 DIAGNOSIS — I10 Essential (primary) hypertension: Secondary | ICD-10-CM

## 2023-09-14 LAB — GC/CHLAMYDIA PROBE AMP (~~LOC~~) NOT AT ARMC
Chlamydia: NEGATIVE
Comment: NEGATIVE
Comment: NORMAL
Neisseria Gonorrhea: NEGATIVE

## 2023-09-14 NOTE — Progress Notes (Unsigned)
 Complex Care Management Note Care Guide Note  09/14/2023 Name: Bianca Medina MRN: [id number] DOB: 12-Mar-1951   Complex Care Management Outreach Attempts: An unsuccessful outreach was attempted for an appointment today.  Follow Up Plan:  Additional outreach attempts will be made to offer the patient complex care management information and services.   Encounter Outcome:  No Answer  [facility]  [facility], [facility] Care Management Assistant Direct Dial: [phone]  Fax: [phone]

## 2023-09-14 NOTE — Transitions of Care (Post Inpatient/ED Visit) (Signed)
   09/14/2023  Name: SHALEI NEYLON MRN: [id number] DOB: [birth year]  Today's TOC FU Call Status: Today's TOC FU Call Status:: Unsuccessful Call (1st Attempt) Unsuccessful Call (1st Attempt) Date: 09/14/23  Attempted to reach the patient regarding the most recent Inpatient/ED visit.  Follow Up Plan: Additional outreach attempts will be made to reach the patient to complete the Transitions of Care (Post Inpatient/ED visit) call.   Signature Darrall Ellison, LPN [facility] Nurse Health Advisor Direct Dial [phone]

## 2023-09-14 NOTE — Telephone Encounter (Signed)
 Copied from CRM [id number]. Topic: Clinical - Lab/Test Results >> Sep 14, 2023  8:13 AM Marlan Silva wrote: Reason for CRM: Patient went to the er yesterday @ [facility] and states that Dr. Tilmon Font can look at her mychart for her results and to please give her a follow up call back confirming that he has viewed the tests.

## 2023-09-15 DIAGNOSIS — R531 Weakness: Secondary | ICD-10-CM | POA: Diagnosis not present

## 2023-09-15 DIAGNOSIS — R262 Difficulty in walking, not elsewhere classified: Secondary | ICD-10-CM | POA: Diagnosis not present

## 2023-09-15 NOTE — Transitions of Care (Post Inpatient/ED Visit) (Signed)
   09/15/2023  Name: Bianca Medina MRN: [id number] DOB: 1950/09/14  Today's TOC FU Call Status: Today's TOC FU Call Status:: Unsuccessful Call (2nd Attempt) Unsuccessful Call (1st Attempt) Date: 09/14/23 Unsuccessful Call (2nd Attempt) Date: 09/15/23  Attempted to reach the patient regarding the most recent Inpatient/ED visit.  Follow Up Plan: Additional outreach attempts will be made to reach the patient to complete the Transitions of Care (Post Inpatient/ED visit) call.   Signature Darrall Ellison, LPN [facility] Nurse Health Advisor Direct Dial [phone]

## 2023-09-16 NOTE — Transitions of Care (Post Inpatient/ED Visit) (Signed)
   09/16/2023  Name: Bianca Medina MRN: [id number] DOB: Aug 01, 1950  Today's TOC FU Call Status: Today's TOC FU Call Status:: Unsuccessful Call (3rd Attempt) Unsuccessful Call (1st Attempt) Date: 09/14/23 Unsuccessful Call (2nd Attempt) Date: 09/15/23 Unsuccessful Call (3rd Attempt) Date: 09/16/23  Attempted to reach the patient regarding the most recent Inpatient/ED visit.  Follow Up Plan: No further outreach attempts will be made at this time. We have been unable to contact the patient.  Signature Darrall Ellison, LPN [facility] Nurse Health Advisor Direct Dial [phone]

## 2023-09-20 NOTE — Progress Notes (Signed)
 Complex Care Management Note Care Guide Note  09/20/2023 Name: Bianca Medina MRN: [id number] DOB: 12-04-1950   Complex Care Management Outreach Attempts: A third unsuccessful outreach was attempted today to offer the patient with information about available complex care management services.  Follow Up Plan:  No further outreach attempts will be made at this time. We have been unable to contact the patient to offer or enroll patient in complex care management services.  Encounter Outcome:  No Answer  [facility]  [facility], [facility] Health Care Management Assistant Direct Dial: [phone]  Fax: [phone]

## 2023-09-22 DIAGNOSIS — R262 Difficulty in walking, not elsewhere classified: Secondary | ICD-10-CM | POA: Diagnosis not present

## 2023-09-22 DIAGNOSIS — R531 Weakness: Secondary | ICD-10-CM | POA: Diagnosis not present

## 2023-09-29 DIAGNOSIS — R531 Weakness: Secondary | ICD-10-CM | POA: Diagnosis not present

## 2023-09-29 DIAGNOSIS — M170 Bilateral primary osteoarthritis of knee: Secondary | ICD-10-CM | POA: Diagnosis not present

## 2023-09-29 DIAGNOSIS — R262 Difficulty in walking, not elsewhere classified: Secondary | ICD-10-CM | POA: Diagnosis not present

## 2023-10-01 ENCOUNTER — Encounter (HOSPITAL_COMMUNITY): Payer: Self-pay | Admitting: Emergency Medicine

## 2023-10-01 ENCOUNTER — Other Ambulatory Visit: Payer: Self-pay

## 2023-10-01 ENCOUNTER — Emergency Department (HOSPITAL_COMMUNITY)
Admission: EM | Admit: 2023-10-01 | Discharge: 2023-10-01 | Disposition: A | Discharge status: Home / Self Care | Attending: Emergency Medicine | Admitting: Emergency Medicine

## 2023-10-01 DIAGNOSIS — J4551 Severe persistent asthma with (acute) exacerbation: Secondary | ICD-10-CM | POA: Diagnosis not present

## 2023-10-01 DIAGNOSIS — N898 Other specified noninflammatory disorders of vagina: Secondary | ICD-10-CM | POA: Insufficient documentation

## 2023-10-01 DIAGNOSIS — Z7982 Long term (current) use of aspirin: Secondary | ICD-10-CM | POA: Diagnosis not present

## 2023-10-01 LAB — URINALYSIS, W/ REFLEX TO CULTURE (INFECTION SUSPECTED)
Bilirubin Urine: NEGATIVE
Glucose, UA: NEGATIVE mg/dL
Hgb urine dipstick: NEGATIVE
Ketones, ur: NEGATIVE mg/dL
Leukocytes,Ua: NEGATIVE
Nitrite: NEGATIVE
Protein, ur: NEGATIVE mg/dL
Specific Gravity, Urine: 1.026 (ref 1.005–1.030)
pH: 6 (ref 5.0–8.0)

## 2023-10-01 LAB — WET PREP, GENITAL
Clue Cells Wet Prep HPF POC: NONE SEEN
Sperm: NONE SEEN
Trich, Wet Prep: NONE SEEN
WBC, Wet Prep HPF POC: <10 (ref <10)
Yeast Wet Prep HPF POC: NONE SEEN
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
## 2023-10-01 NOTE — Discharge Instructions (Addendum)
 Return for any problem.  ?
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
## 2023-10-04 LAB — GC/CHLAMYDIA PROBE AMP (~~LOC~~) NOT AT ARMC
Chlamydia: NEGATIVE
Comment: NEGATIVE
Comment: NORMAL
Neisseria Gonorrhea: NEGATIVE

## 2023-10-06 DIAGNOSIS — R262 Difficulty in walking, not elsewhere classified: Secondary | ICD-10-CM | POA: Diagnosis not present

## 2023-10-06 DIAGNOSIS — R531 Weakness: Secondary | ICD-10-CM | POA: Diagnosis not present

## 2023-10-27 DIAGNOSIS — R531 Weakness: Secondary | ICD-10-CM | POA: Diagnosis not present

## 2023-10-27 DIAGNOSIS — R262 Difficulty in walking, not elsewhere classified: Secondary | ICD-10-CM | POA: Diagnosis not present

## 2023-11-01 DIAGNOSIS — J4551 Severe persistent asthma with (acute) exacerbation: Secondary | ICD-10-CM | POA: Diagnosis not present

## 2023-11-09 DIAGNOSIS — R262 Difficulty in walking, not elsewhere classified: Secondary | ICD-10-CM | POA: Diagnosis not present

## 2023-11-09 DIAGNOSIS — R531 Weakness: Secondary | ICD-10-CM | POA: Diagnosis not present

## 2023-11-10 DIAGNOSIS — E66813 Obesity, class 3: Secondary | ICD-10-CM | POA: Diagnosis not present

## 2023-11-10 DIAGNOSIS — Z6841 Body Mass Index (BMI) 40.0 and over, adult: Secondary | ICD-10-CM | POA: Diagnosis not present

## 2023-11-10 DIAGNOSIS — R002 Palpitations: Secondary | ICD-10-CM | POA: Diagnosis not present

## 2023-11-11 NOTE — Progress Notes (Signed)
   11/11/2023  Patient ID: Bianca Medina, female   DOB: 02-Feb-1951, 73 y.o.   MRN: [id number]  This patient is appearing on a report for being at risk of failing the adherence measure for hypertension (ACEi/ARB) medications this calendar year.   Medication: losartan  25mg  Last fill date: 10/23/23 for 90 day supply  Insurance report was not up to date. No action needed at this time.   Channing DELENA Mealing, PharmD, DPLA

## 2023-11-12 DIAGNOSIS — R262 Difficulty in walking, not elsewhere classified: Secondary | ICD-10-CM | POA: Diagnosis not present

## 2023-11-12 DIAGNOSIS — R531 Weakness: Secondary | ICD-10-CM | POA: Diagnosis not present

## 2023-11-16 DIAGNOSIS — R262 Difficulty in walking, not elsewhere classified: Secondary | ICD-10-CM | POA: Diagnosis not present

## 2023-11-16 DIAGNOSIS — R531 Weakness: Secondary | ICD-10-CM | POA: Diagnosis not present

## 2023-11-18 DIAGNOSIS — N952 Postmenopausal atrophic vaginitis: Secondary | ICD-10-CM | POA: Diagnosis not present

## 2023-11-18 DIAGNOSIS — C50411 Malignant neoplasm of upper-outer quadrant of right female breast: Secondary | ICD-10-CM | POA: Diagnosis not present

## 2023-11-18 DIAGNOSIS — Z779 Other contact with and (suspected) exposures hazardous to health: Secondary | ICD-10-CM | POA: Diagnosis not present

## 2023-11-18 DIAGNOSIS — Z6841 Body Mass Index (BMI) 40.0 and over, adult: Secondary | ICD-10-CM | POA: Diagnosis not present

## 2023-11-23 DIAGNOSIS — R262 Difficulty in walking, not elsewhere classified: Secondary | ICD-10-CM | POA: Diagnosis not present

## 2023-11-23 DIAGNOSIS — R531 Weakness: Secondary | ICD-10-CM | POA: Diagnosis not present

## 2023-11-24 ENCOUNTER — Ambulatory Visit: Admitting: Podiatry

## 2023-11-24 ENCOUNTER — Ambulatory Visit (INDEPENDENT_AMBULATORY_CARE_PROVIDER_SITE_OTHER)

## 2023-11-24 ENCOUNTER — Encounter: Payer: Self-pay | Admitting: Podiatry

## 2023-11-24 DIAGNOSIS — M7672 Peroneal tendinitis, left leg: Secondary | ICD-10-CM | POA: Diagnosis not present

## 2023-11-24 DIAGNOSIS — S92355A Nondisplaced fracture of fifth metatarsal bone, left foot, initial encounter for closed fracture: Secondary | ICD-10-CM | POA: Diagnosis not present

## 2023-11-24 DIAGNOSIS — S92352D Displaced fracture of fifth metatarsal bone, left foot, subsequent encounter for fracture with routine healing: Secondary | ICD-10-CM | POA: Diagnosis not present

## 2023-11-24 NOTE — Progress Notes (Signed)
  Subjective:  Patient ID: Bianca Medina, female    DOB: [birth year],   MRN: [id number]  Chief Complaint  Patient presents with   Fracture    Last week, I experienced a pain that went from my ankle up my leg.  Since then, my foot has hurt more.  It had seemed to have been getting better until that happened.    73 y.o. female presents for follow-up of fifth metatarsal avulsion fracture on left. She has not been seen since April. Relates she was doing well and then a week ago started getting pain up the side of her ankle and has been hurting since then. She has continued in the boot and has been in PT.  Denies any other pedal complaints. Denies n/v/f/c.   Past Medical History:  Diagnosis Date   Adenomatous colon polyp 2021   Anemia 2003   IRON  DEFICIENCY    Asthma    Breast cancer (HCC)    right breast   Chronic anal fissure    Common migraine 06/23/2019   Diabetes mellitus without complication (HCC)    GERD (gastroesophageal reflux disease)    Hearing loss    Hyperlipidemia, mild    Hypertension    IDA (iron  deficiency anemia)    NSVD (normal spontaneous vaginal delivery)    X3   Obesity    PMDD (premenstrual dysphoric disorder)    Sensorineural hearing loss (SNHL) of both ears    Sleep disorder     Objective:  Physical Exam: Vascular: DP/PT pulses 2/4 bilateral. CFT <3 seconds. Normal hair growth on digits. No edema.  Skin. No lacerations or abrasions bilateral feet.  Musculoskeletal: MMT 5/5 bilateral lower extremities in DF, PF, Inversion and Eversion. Deceased ROM in DF of ankle joint. Tender to base of fifth metatarsal with edema and eccymosis mild noted.  More tenderness proximally along the peroneal tendon.  Neurological: Sensation intact to light touch.   Assessment:   1. Fracture of base of fifth metatarsal bone of left foot with routine healing, subsequent encounter   2. Peroneal tendonitis, left      Plan:  Patient was evaluated and treated and all  questions answered. -Xrays reviewed. Small avulsion fracture of base of fifth metatarsal on left foot No displaced. Appears to have mostly consilidated small spicular off of bone in tendon likely won't heal.  -Discussed treatement options for avulsion fifth metatarsal fracture feel this has mostly healed. Discussed peroneal tendontisi; risks, alternatives, and benefits explained. -will transition out of CAM boot into ankle brace. Dispnesed today -Recommend protection, rest, ice, elevation daily until symptoms improve -anti-inflammatories as needed -Continue PT.  -Patient to return to office in 6 weeks for serial x-rays to assess healing  or sooner if condition worsens.   Asberry Failing, DPM

## 2023-11-25 ENCOUNTER — Encounter: Admitting: Obstetrics and Gynecology

## 2023-12-01 ENCOUNTER — Ambulatory Visit: Admitting: Orthopaedic Surgery

## 2023-12-01 ENCOUNTER — Other Ambulatory Visit (INDEPENDENT_AMBULATORY_CARE_PROVIDER_SITE_OTHER): Payer: Self-pay

## 2023-12-01 DIAGNOSIS — M79604 Pain in right leg: Secondary | ICD-10-CM

## 2023-12-01 DIAGNOSIS — J4551 Severe persistent asthma with (acute) exacerbation: Secondary | ICD-10-CM | POA: Diagnosis not present

## 2023-12-01 MED ORDER — GABAPENTIN 300 MG PO CAPS: 300.0000 mg | ORAL_CAPSULE | Freq: Every day | ORAL | 0 refills | Status: AC

## 2023-12-01 NOTE — Progress Notes (Signed)
  The patient is 73 year old female that we have seen in the past.  She comes in with low back pain and weakness with radicular symptoms going down both of her legs.  She has had a flareup for the last 3 days with no known injury.  She did have a MRI of her lumbar spine last year that showed a disc bulge at L5-S1 that could be affecting the nerves at that level there was only mild foraminal stenosis at the time.  She is currently taking meloxicam .  On exam she does not have a true positive straight leg raise bilaterally and there is not any significant weakness in her lower extremities.  She is wearing an ankle brace on her left ankle and has been having physical therapy at [facility].  X-rays of the lumbar spine show no acute findings.  She is a diabetic.  She reports decent blood glucose control.  I would like to start her on gabapentin  to take as needed as night and if she is tolerating this well at 300 mg she can increase this to twice a day.  This could help with the radicular symptoms.  I will also give her a new prescription to physical therapy and she will take this to her current physical therapist for any modalities that can help decrease her back pain and decrease the radicular symptoms going down her legs.  The modalities will be per the therapist discretion.  We will then see her back in about 6 weeks to see how she is doing overall.

## 2023-12-02 ENCOUNTER — Encounter: Payer: Self-pay | Admitting: Obstetrics and Gynecology

## 2023-12-02 ENCOUNTER — Other Ambulatory Visit (HOSPITAL_COMMUNITY)
Admission: RE | Admit: 2023-12-02 | Discharge: 2023-12-02 | Disposition: A | Discharge status: Home / Self Care | Source: Ambulatory Visit | Attending: Physician Assistant | Admitting: Physician Assistant

## 2023-12-02 ENCOUNTER — Ambulatory Visit: Admitting: Obstetrics and Gynecology

## 2023-12-02 VITALS — BP 135/67 | HR 55 | Ht 64.5 in | Wt 264.0 lb

## 2023-12-02 DIAGNOSIS — N898 Other specified noninflammatory disorders of vagina: Secondary | ICD-10-CM | POA: Insufficient documentation

## 2023-12-02 DIAGNOSIS — N958 Other specified menopausal and perimenopausal disorders: Secondary | ICD-10-CM | POA: Diagnosis not present

## 2023-12-02 LAB — POCT URINALYSIS DIPSTICK
Bilirubin, UA: NEGATIVE
Blood, UA: NEGATIVE
Glucose, UA: NEGATIVE
Ketones, UA: NEGATIVE
Leukocytes, UA: NEGATIVE
Nitrite, UA: NEGATIVE
Protein, UA: NEGATIVE
Spec Grav, UA: 1.015 (ref 1.010–1.025)
Urobilinogen, UA: 0.2 U/dL
pH, UA: 5 (ref 5.0–8.0)

## 2023-12-02 MED ORDER — ESTRADIOL 0.1 MG/GM VA CREA: 1.0000 | TOPICAL_CREAM | VAGINAL | 3 refills | Status: AC

## 2023-12-02 NOTE — Progress Notes (Signed)
 Pt presents for ED f/u. No report no improvement in vaginal discharge and odor. Pt also reports cloudy urine, denies pain

## 2023-12-02 NOTE — Progress Notes (Signed)
   GYNECOLOGY PROBLEM  VISIT ENCOUNTER NOTE  Subjective:   Bianca Medina is a 73 y.o. [id number] female here for a problem GYN visit.  Current complaints: Vaginal discharge. No pain, general irritation. Odor is intermittent. No urinary symptoms or vaginal bleeding.   Gynecologic History No LMP recorded. Patient has had a hysterectomy.  Health Maintenance Due  Topic Date Due   Diabetic kidney evaluation - Urine ACR  Never done   Medicare Annual Wellness (AWV)  04/01/2023   FOOT EXAM  07/22/2023   OPHTHALMOLOGY EXAM  08/04/2023    The following portions of the patient's history were reviewed and updated as appropriate: allergies, current medications, past family history, past medical history, past social history, past surgical history and problem list.  Review of Systems Pertinent items are noted in HPI.   Objective:  BP 135/67   Pulse (!) 55   Ht 5' 4.5 (1.638 m)   Wt 264 lb (119.7 kg)   BMI 44.62 kg/m   Gen: well appearing, NAD HEENT: no scleral icterus CV: RR Lung: Normal WOB Ext: warm well perfused  PELVIC: Normal appearing external genitalia; vaginal mucosa atrophic. Scant discharge. Normal cervix without lesions  Chaperone present.  Assessment and Plan:  1. Vaginal discharge (Primary) No evidence of yeast or BV today. Did obtain wet prep for further evaluation. UA without signs of UTI. - POCT Urinalysis Dipstick - Cervicovaginal ancillary only( [place])  2. Genitourinary syndrome of menopause Atrophic vaginal tissue on exam. Suspect this may be contributing to some irritation. Amenable to trying vaginal estrogen. - estradiol  (ESTRACE  VAGINAL) 0.1 MG/GM vaginal cream; Place 1 Applicatorful vaginally 3 (three) times a week.  Dispense: 42.5 g; Refill: 3  Please refer to After Visit Summary for other counseling recommendations.   Return in about 1 year (around 12/01/2024) for Annual.  Almarie CHRISTELLA Moats, MD

## 2023-12-03 LAB — CERVICOVAGINAL ANCILLARY ONLY
Bacterial Vaginitis (gardnerella): NEGATIVE
Candida Glabrata: NEGATIVE
Candida Vaginitis: NEGATIVE
Chlamydia: NEGATIVE
Comment: NEGATIVE
Comment: NEGATIVE
Comment: NEGATIVE
Comment: NEGATIVE
Comment: NEGATIVE
Comment: NORMAL
Neisseria Gonorrhea: NEGATIVE
Trichomonas: NEGATIVE

## 2023-12-07 DIAGNOSIS — M5416 Radiculopathy, lumbar region: Secondary | ICD-10-CM | POA: Diagnosis not present

## 2023-12-07 DIAGNOSIS — R262 Difficulty in walking, not elsewhere classified: Secondary | ICD-10-CM | POA: Diagnosis not present

## 2023-12-07 DIAGNOSIS — M6281 Muscle weakness (generalized): Secondary | ICD-10-CM | POA: Diagnosis not present

## 2023-12-09 ENCOUNTER — Encounter: Admitting: Obstetrics & Gynecology

## 2023-12-10 ENCOUNTER — Other Ambulatory Visit: Payer: Self-pay

## 2023-12-10 DIAGNOSIS — J4520 Mild intermittent asthma, uncomplicated: Secondary | ICD-10-CM | POA: Diagnosis not present

## 2023-12-10 DIAGNOSIS — J3089 Other allergic rhinitis: Secondary | ICD-10-CM | POA: Diagnosis not present

## 2023-12-10 DIAGNOSIS — J3081 Allergic rhinitis due to animal (cat) (dog) hair and dander: Secondary | ICD-10-CM | POA: Diagnosis not present

## 2023-12-10 DIAGNOSIS — J301 Allergic rhinitis due to pollen: Secondary | ICD-10-CM | POA: Diagnosis not present

## 2023-12-10 MED ORDER — ESTRADIOL 0.1 MG/GM VA CREA: 1.0000 | TOPICAL_CREAM | VAGINAL | 0 refills | Status: AC
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
## 2023-12-15 DIAGNOSIS — R262 Difficulty in walking, not elsewhere classified: Secondary | ICD-10-CM | POA: Diagnosis not present

## 2023-12-15 DIAGNOSIS — M5416 Radiculopathy, lumbar region: Secondary | ICD-10-CM | POA: Diagnosis not present

## 2023-12-15 DIAGNOSIS — M6281 Muscle weakness (generalized): Secondary | ICD-10-CM | POA: Diagnosis not present

## 2023-12-27 DIAGNOSIS — M5416 Radiculopathy, lumbar region: Secondary | ICD-10-CM | POA: Diagnosis not present

## 2023-12-27 DIAGNOSIS — R262 Difficulty in walking, not elsewhere classified: Secondary | ICD-10-CM | POA: Diagnosis not present

## 2023-12-27 DIAGNOSIS — M6281 Muscle weakness (generalized): Secondary | ICD-10-CM | POA: Diagnosis not present

## 2023-12-28 ENCOUNTER — Ambulatory Visit: Admitting: Family Medicine

## 2023-12-28 ENCOUNTER — Ambulatory Visit: Payer: Self-pay

## 2023-12-28 ENCOUNTER — Encounter: Payer: Self-pay | Admitting: Family Medicine

## 2023-12-28 VITALS — BP 122/72 | HR 67 | Temp 97.6°F | Ht 64.0 in | Wt 265.2 lb

## 2023-12-28 DIAGNOSIS — E118 Type 2 diabetes mellitus with unspecified complications: Secondary | ICD-10-CM

## 2023-12-28 DIAGNOSIS — M25512 Pain in left shoulder: Secondary | ICD-10-CM | POA: Diagnosis not present

## 2023-12-28 DIAGNOSIS — E559 Vitamin D deficiency, unspecified: Secondary | ICD-10-CM

## 2023-12-28 DIAGNOSIS — M25511 Pain in right shoulder: Secondary | ICD-10-CM | POA: Diagnosis not present

## 2023-12-28 DIAGNOSIS — I10 Essential (primary) hypertension: Secondary | ICD-10-CM

## 2023-12-28 DIAGNOSIS — E7800 Pure hypercholesterolemia, unspecified: Secondary | ICD-10-CM | POA: Diagnosis not present

## 2023-12-28 DIAGNOSIS — F258 Other schizoaffective disorders: Secondary | ICD-10-CM

## 2023-12-28 NOTE — Telephone Encounter (Signed)
 FYI Only or Action Required?: FYI only for provider.  Patient was last seen in primary care on 08/26/2023 by Berneta Elsie Sayre, MD.  Called Nurse Triage reporting Shoulder Pain and Back Pain.  Symptoms began several weeks ago.  Interventions attempted: Rest, hydration, or home remedies.  Symptoms are: unchanged.  Triage Disposition: See PCP When Office is Open (Within 3 Days)  Patient/caregiver understands and will follow disposition?:   Copied from CRM #[id number]. Topic: Clinical - Red Word Triage >> Dec 28, 2023  8:44 AM Adelita E wrote: [facility] that prompted transfer to Nurse Triage: Upper back, and shoulder soreness. Patient feels like her shoulders are getting larger and more tension, been going on for the past 1.5 weeks. Reason for Disposition  [1] MODERATE back pain (e.g., interferes with normal activities) AND [2] present > 3 days  [1] MODERATE pain (e.g., interferes with normal activities) AND [2] present > 3 days  Answer Assessment - Initial Assessment Questions 1. ONSET: When did the pain begin? (e.g., minutes, hours, days)     Chronic pain-patient has been going to physical therapy 2. LOCATION: Where does it hurt? (upper, mid or lower back)     Upper and lower back 3. SEVERITY: How bad is the pain?  (e.g., Scale 1-10; mild, moderate, or severe)     Mild-lower back 4. PATTERN: Is the pain constant? (e.g., yes, no; constant, intermittent)      constant 5. RADIATION: Does the pain shoot into your legs or somewhere else?     no 6. CAUSE:  What do you think is causing the back pain?      unsure 7. BACK OVERUSE:  Any recent lifting of heavy objects, strenuous work or exercise?     no 8. MEDICINES: What have you taken so far for the pain? (e.g., nothing, acetaminophen , NSAIDS)     No medications 9. NEUROLOGIC SYMPTOMS: Do you have any weakness, numbness, or problems with bowel/bladder control?     no 10. OTHER SYMPTOMS: Do you have any other  symptoms? (e.g., fever, abdomen pain, burning with urination, blood in urine)       Shoulder discomfort  Patient reports feeling like her shoulders are getting bigger. Reports continued pain to her lower back. Patient requested to see provider today. Appointment scheduled for 2:00 PM today.  Answer Assessment - Initial Assessment Questions 1. ONSET: When did the pain start?     1.5 weeks ago 2. LOCATION: Where is the pain located?     Both shoulders 3. PAIN: How bad is the pain? (Scale 1-10; or mild, moderate, severe)     Mild-moderate 4. WORK OR EXERCISE: Has there been any recent work or exercise that involved this part of the body?     no 5. CAUSE: What do you think is causing the shoulder pain?     unsure 6. OTHER SYMPTOMS: Do you have any other symptoms? (e.g., neck pain, swelling, rash, fever, numbness, weakness)     Back pain  Protocols used: Back Pain-A-AH, Shoulder Pain-A-AH
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
## 2023-12-29 LAB — BASIC METABOLIC PANEL WITH GFR
BUN: 17 mg/dL (ref 6–23)
CO2: 30 meq/L (ref 19–32)
Calcium: 8.9 mg/dL (ref 8.4–10.5)
Chloride: 101 meq/L (ref 96–112)
Creatinine, Ser: 0.74 mg/dL (ref 0.40–1.20)
GFR: 81.25 mL/min (ref >60.00)
Glucose, Bld: 117 mg/dL — ABNORMAL HIGH (ref 70–99)
Potassium: 4 meq/L (ref 3.5–5.1)
Sodium: 138 meq/L (ref 135–145)

## 2023-12-29 LAB — LDL CHOLESTEROL, DIRECT: Direct LDL: 87 mg/dL

## 2023-12-29 LAB — HEMOGLOBIN A1C: Hgb A1c MFr Bld: 7.5 % — ABNORMAL HIGH (ref 4.6–6.5)

## 2023-12-30 ENCOUNTER — Encounter: Payer: Self-pay | Admitting: Family Medicine

## 2023-12-30 ENCOUNTER — Ambulatory Visit: Payer: Self-pay | Admitting: Family Medicine

## 2023-12-30 ENCOUNTER — Ambulatory Visit: Admitting: Family Medicine

## 2023-12-30 DIAGNOSIS — M6281 Muscle weakness (generalized): Secondary | ICD-10-CM | POA: Diagnosis not present

## 2023-12-30 DIAGNOSIS — M5416 Radiculopathy, lumbar region: Secondary | ICD-10-CM | POA: Diagnosis not present

## 2023-12-30 DIAGNOSIS — R262 Difficulty in walking, not elsewhere classified: Secondary | ICD-10-CM | POA: Diagnosis not present

## 2023-12-30 MED ORDER — ATORVASTATIN CALCIUM 20 MG PO TABS: 20.0000 mg | ORAL_TABLET | Freq: Every day | ORAL | 3 refills | Status: AC

## 2023-12-30 NOTE — Addendum Note (Signed)
 Addended by: BERNETA ELSIE LABOR on: 12/30/2023 07:51 AM   Modules accepted: Orders

## 2024-01-01 DIAGNOSIS — J4551 Severe persistent asthma with (acute) exacerbation: Secondary | ICD-10-CM | POA: Diagnosis not present

## 2024-01-06 ENCOUNTER — Ambulatory Visit: Admitting: Family Medicine

## 2024-01-07 DIAGNOSIS — M6281 Muscle weakness (generalized): Secondary | ICD-10-CM | POA: Diagnosis not present

## 2024-01-07 DIAGNOSIS — R262 Difficulty in walking, not elsewhere classified: Secondary | ICD-10-CM | POA: Diagnosis not present

## 2024-01-07 DIAGNOSIS — M5416 Radiculopathy, lumbar region: Secondary | ICD-10-CM | POA: Diagnosis not present

## 2024-01-11 ENCOUNTER — Encounter: Payer: Self-pay | Admitting: Podiatry

## 2024-01-11 ENCOUNTER — Ambulatory Visit: Admitting: Podiatry

## 2024-01-11 ENCOUNTER — Ambulatory Visit (INDEPENDENT_AMBULATORY_CARE_PROVIDER_SITE_OTHER)

## 2024-01-11 DIAGNOSIS — S92352D Displaced fracture of fifth metatarsal bone, left foot, subsequent encounter for fracture with routine healing: Secondary | ICD-10-CM | POA: Diagnosis not present

## 2024-01-11 DIAGNOSIS — M7672 Peroneal tendinitis, left leg: Secondary | ICD-10-CM

## 2024-01-11 NOTE — Progress Notes (Signed)
  Subjective:  Patient ID: Bianca Medina, female    DOB: 07-Dec-1950,   MRN: [id number]  Chief Complaint  Patient presents with   Fracture    The left foot is better.  I continue to have swelling in my ankle.   Foot Pain    I'm still having problems with my ankle.  I'm having difficulty walking up stairs.    73 y.o. female presents for follow-up of fifth metatarsal avulsion fracture on left. Relates foot is doing well but still getting swelling and pain in the ankle especially walking up stairs. She has continued in the boot and has been in PT.  Denies any other pedal complaints. Denies n/v/f/c.   Past Medical History:  Diagnosis Date   Adenomatous colon polyp 2021   Anemia 2003   IRON  DEFICIENCY    Asthma    Breast cancer (HCC)    right breast   Chronic anal fissure    Common migraine 06/23/2019   Diabetes mellitus without complication (HCC)    GERD (gastroesophageal reflux disease)    Hearing loss    Hyperlipidemia, mild    Hypertension    IDA (iron  deficiency anemia)    NSVD (normal spontaneous vaginal delivery)    X3   Obesity    PMDD (premenstrual dysphoric disorder)    Sensorineural hearing loss (SNHL) of both ears    Sleep disorder     Objective:  Physical Exam: Vascular: DP/PT pulses 2/4 bilateral. CFT <3 seconds. Normal hair growth on digits. No edema.  Skin. No lacerations or abrasions bilateral feet.  Musculoskeletal: MMT 5/5 bilateral lower extremities in DF, PF, Inversion and Eversion. Deceased ROM in DF of ankle joint. Non tender to base of fifth metatarsal.  More tenderness proximally along the peroneal tendon. No pain with MMT.  Neurological: Sensation intact to light touch.   Assessment:   1. Fracture of base of fifth metatarsal bone of left foot with routine healing, subsequent encounter      Plan:  Patient was evaluated and treated and all questions answered. -Xrays reviewed. Small avulsion fracture of base of fifth metatarsal on left foot No  displaced. Appears to have mostly consilidated small spicular off of bone in tendon likely won't heal.  -Discussed treatement options for avulsion fifth metatarsal fracture feel this has mostly healed. Discussed peroneal tendontisi; risks, alternatives, and benefits explained. -Recommend protection, rest, ice, elevation daily until symptoms improve -anti-inflammatories as needed -Continue PT.  -Patient to return to office as needed   Asberry Failing, DPM

## 2024-01-12 ENCOUNTER — Ambulatory Visit: Admitting: Orthopaedic Surgery

## 2024-01-13 DIAGNOSIS — M5416 Radiculopathy, lumbar region: Secondary | ICD-10-CM | POA: Diagnosis not present

## 2024-01-13 DIAGNOSIS — R262 Difficulty in walking, not elsewhere classified: Secondary | ICD-10-CM | POA: Diagnosis not present

## 2024-01-13 DIAGNOSIS — M6281 Muscle weakness (generalized): Secondary | ICD-10-CM | POA: Diagnosis not present

## 2024-01-20 DIAGNOSIS — M6281 Muscle weakness (generalized): Secondary | ICD-10-CM | POA: Diagnosis not present

## 2024-01-20 DIAGNOSIS — M5416 Radiculopathy, lumbar region: Secondary | ICD-10-CM | POA: Diagnosis not present

## 2024-01-20 DIAGNOSIS — R262 Difficulty in walking, not elsewhere classified: Secondary | ICD-10-CM | POA: Diagnosis not present

## 2024-01-23 ENCOUNTER — Other Ambulatory Visit: Payer: Self-pay

## 2024-01-23 ENCOUNTER — Emergency Department (HOSPITAL_COMMUNITY)
Admission: EM | Admit: 2024-01-23 | Discharge: 2024-01-23 | Disposition: A | Discharge status: Home / Self Care | Attending: Emergency Medicine | Admitting: Emergency Medicine

## 2024-01-23 ENCOUNTER — Encounter (HOSPITAL_COMMUNITY): Payer: Self-pay | Admitting: Emergency Medicine

## 2024-01-23 DIAGNOSIS — J45909 Unspecified asthma, uncomplicated: Secondary | ICD-10-CM | POA: Diagnosis not present

## 2024-01-23 DIAGNOSIS — Z7984 Long term (current) use of oral hypoglycemic drugs: Secondary | ICD-10-CM | POA: Diagnosis not present

## 2024-01-23 DIAGNOSIS — I10 Essential (primary) hypertension: Secondary | ICD-10-CM | POA: Diagnosis not present

## 2024-01-23 DIAGNOSIS — R110 Nausea: Secondary | ICD-10-CM | POA: Diagnosis not present

## 2024-01-23 DIAGNOSIS — Z7982 Long term (current) use of aspirin: Secondary | ICD-10-CM | POA: Insufficient documentation

## 2024-01-23 DIAGNOSIS — R09A9 Foreign body sensation, other site: Secondary | ICD-10-CM | POA: Insufficient documentation

## 2024-01-23 DIAGNOSIS — Z7951 Long term (current) use of inhaled steroids: Secondary | ICD-10-CM | POA: Diagnosis not present

## 2024-01-23 DIAGNOSIS — E119 Type 2 diabetes mellitus without complications: Secondary | ICD-10-CM | POA: Diagnosis not present

## 2024-01-23 DIAGNOSIS — Z79899 Other long term (current) drug therapy: Secondary | ICD-10-CM | POA: Diagnosis not present

## 2024-01-23 DIAGNOSIS — F259 Schizoaffective disorder, unspecified: Secondary | ICD-10-CM | POA: Diagnosis not present

## 2024-01-23 DIAGNOSIS — R09A0 Foreign body sensation, unspecified: Secondary | ICD-10-CM

## 2024-01-23 LAB — COMPREHENSIVE METABOLIC PANEL WITH GFR
ALT: 17 U/L (ref 0–44)
AST: 18 U/L (ref 15–41)
Albumin: 3 g/dL — ABNORMAL LOW (ref 3.5–5.0)
Alkaline Phosphatase: 85 U/L (ref 38–126)
Anion gap: 7 (ref 5–15)
BUN: 16 mg/dL (ref 8–23)
CO2: 29 mmol/L (ref 22–32)
Calcium: 8.8 mg/dL — ABNORMAL LOW (ref 8.9–10.3)
Chloride: 101 mmol/L (ref 98–111)
Creatinine, Ser: 0.74 mg/dL (ref 0.44–1.00)
GFR, Estimated: >60 mL/min (ref >60)
Glucose, Bld: 141 mg/dL — ABNORMAL HIGH (ref 70–99)
Potassium: 3.9 mmol/L (ref 3.5–5.1)
Sodium: 137 mmol/L (ref 135–145)
Total Bilirubin: 0.9 mg/dL (ref 0.0–1.2)
Total Protein: 6.6 g/dL (ref 6.5–8.1)

## 2024-01-23 LAB — URINALYSIS, ROUTINE W REFLEX MICROSCOPIC
Bilirubin Urine: NEGATIVE
Glucose, UA: NEGATIVE mg/dL
Hgb urine dipstick: NEGATIVE
Ketones, ur: NEGATIVE mg/dL
Leukocytes,Ua: NEGATIVE
Nitrite: NEGATIVE
Protein, ur: NEGATIVE mg/dL
Specific Gravity, Urine: 1.013 (ref 1.005–1.030)
pH: 8 (ref 5.0–8.0)

## 2024-01-23 LAB — RAPID URINE DRUG SCREEN, HOSP PERFORMED
Amphetamines: NOT DETECTED
Barbiturates: NOT DETECTED
Benzodiazepines: NOT DETECTED
Cocaine: NOT DETECTED
Opiates: NOT DETECTED
Tetrahydrocannabinol: NOT DETECTED

## 2024-01-23 LAB — CBC
HCT: 39.6 % (ref 36.0–46.0)
Hemoglobin: 13.1 g/dL (ref 12.0–15.0)
MCH: 24.8 pg — ABNORMAL LOW (ref 26.0–34.0)
MCHC: 33.1 g/dL (ref 30.0–36.0)
MCV: 75 fL — ABNORMAL LOW (ref 80.0–100.0)
Platelets: 264 K/uL (ref 150–400)
RBC: 5.28 MIL/uL — ABNORMAL HIGH (ref 3.87–5.11)
RDW: 14.9 % (ref 11.5–15.5)
WBC: 8.9 K/uL (ref 4.0–10.5)
nRBC: 0 % (ref 0.0–0.2)

## 2024-01-23 LAB — ETHANOL: Alcohol, Ethyl (B): <15 mg/dL (ref <15)

## 2024-01-23 MED ORDER — ONDANSETRON 4 MG PO TBDP
8.0000 mg | ORAL_TABLET | Freq: Once | ORAL | Status: AC
  Administered 2024-01-23: 8 mg via ORAL
  Filled 2024-01-23: qty 2
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
## 2024-01-23 NOTE — Discharge Instructions (Signed)
 Today you were seen for nausea and sensation of foreign body.  Please follow-up with your primary care if your symptoms persist for further evaluation workup.  Thank you for letting us  treat you today. After reviewing your labs, I feel you are safe to go home. Please follow up with your PCP in the next several days and provide them with your records from this visit. Return to the Emergency Room if pain becomes severe or symptoms worsen.

## 2024-01-24 NOTE — Telephone Encounter (Unsigned)
 Copied from CRM [id number]. Topic: General - Other >> Jan 24, 2024  1:47 PM Rea ORN wrote: Reason for CRM: April with [employer] called to see if clinic received fax from 9/4 regarding the pt fracturing her toe. Please call back [phone]

## 2024-01-24 NOTE — Telephone Encounter (Signed)
 Copied from CRM [id number]. Topic: General - Other >> Jan 24, 2024  2:36 PM Bianca Medina I wrote: Reason for CRM: Patient just wanted to let Dr. Berneta know she went to hospital yesterday and they diagnosed her with nausea and sensation of foreign body

## 2024-01-26 DIAGNOSIS — I10 Essential (primary) hypertension: Secondary | ICD-10-CM | POA: Diagnosis not present

## 2024-01-26 DIAGNOSIS — E119 Type 2 diabetes mellitus without complications: Secondary | ICD-10-CM | POA: Diagnosis not present

## 2024-01-26 DIAGNOSIS — E782 Mixed hyperlipidemia: Secondary | ICD-10-CM | POA: Diagnosis not present

## 2024-01-26 DIAGNOSIS — E559 Vitamin D deficiency, unspecified: Secondary | ICD-10-CM | POA: Diagnosis not present

## 2024-01-27 DIAGNOSIS — R600 Localized edema: Secondary | ICD-10-CM | POA: Diagnosis not present

## 2024-01-27 DIAGNOSIS — R262 Difficulty in walking, not elsewhere classified: Secondary | ICD-10-CM | POA: Diagnosis not present

## 2024-01-27 DIAGNOSIS — M25673 Stiffness of unspecified ankle, not elsewhere classified: Secondary | ICD-10-CM | POA: Diagnosis not present

## 2024-01-31 ENCOUNTER — Ambulatory Visit: Admitting: Family Medicine

## 2024-02-01 ENCOUNTER — Telehealth: Payer: Self-pay

## 2024-02-01 DIAGNOSIS — J4551 Severe persistent asthma with (acute) exacerbation: Secondary | ICD-10-CM | POA: Diagnosis not present

## 2024-02-01 NOTE — Telephone Encounter (Signed)
 Copied from CRM [id number]. Topic: General - Other >> Feb 01, 2024  3:53 PM Mercedes MATSU wrote: Reason for CRM: April from [facility] called in wanting to know a update on some paper work that she faxed over in regards to a dexa scan. April is requesting a call back to verify if this form has been received she can be reached at [phone].

## 2024-02-02 ENCOUNTER — Other Ambulatory Visit: Payer: Self-pay | Admitting: Family Medicine

## 2024-02-02 DIAGNOSIS — R262 Difficulty in walking, not elsewhere classified: Secondary | ICD-10-CM | POA: Diagnosis not present

## 2024-02-02 DIAGNOSIS — Z13820 Encounter for screening for osteoporosis: Secondary | ICD-10-CM

## 2024-02-02 DIAGNOSIS — M25673 Stiffness of unspecified ankle, not elsewhere classified: Secondary | ICD-10-CM | POA: Diagnosis not present

## 2024-02-02 DIAGNOSIS — R600 Localized edema: Secondary | ICD-10-CM | POA: Diagnosis not present

## 2024-02-04 ENCOUNTER — Ambulatory Visit: Admitting: Family Medicine

## 2024-02-04 ENCOUNTER — Encounter: Payer: Self-pay | Admitting: Family Medicine

## 2024-02-04 VITALS — BP 122/82 | HR 80 | Temp 98.0°F | Ht 65.0 in | Wt 268.4 lb

## 2024-02-04 DIAGNOSIS — E7800 Pure hypercholesterolemia, unspecified: Secondary | ICD-10-CM

## 2024-02-04 DIAGNOSIS — E118 Type 2 diabetes mellitus with unspecified complications: Secondary | ICD-10-CM | POA: Diagnosis not present

## 2024-02-04 DIAGNOSIS — Z7984 Long term (current) use of oral hypoglycemic drugs: Secondary | ICD-10-CM | POA: Diagnosis not present

## 2024-02-04 DIAGNOSIS — C50911 Malignant neoplasm of unspecified site of right female breast: Secondary | ICD-10-CM

## 2024-02-04 DIAGNOSIS — Z170 Estrogen receptor positive status [ER+]: Secondary | ICD-10-CM

## 2024-02-04 DIAGNOSIS — F258 Other schizoaffective disorders: Secondary | ICD-10-CM
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
## 2024-02-09 DIAGNOSIS — R262 Difficulty in walking, not elsewhere classified: Secondary | ICD-10-CM | POA: Diagnosis not present

## 2024-02-09 DIAGNOSIS — M25673 Stiffness of unspecified ankle, not elsewhere classified: Secondary | ICD-10-CM | POA: Diagnosis not present

## 2024-02-09 NOTE — Progress Notes (Signed)
 Bianca Medina                                          MRN: [id number]   02/09/2024   The [facility] Quality Team Specialist reviewed this patient medical record for the purposes of chart review for care gap closure. The following were reviewed: chart review for care gap closure-kidney health evaluation for diabetes:eGFR  and uACR.    [facility] Quality Team

## 2024-02-10 ENCOUNTER — Ambulatory Visit: Admitting: Family Medicine

## 2024-02-18 ENCOUNTER — Ambulatory Visit: Admitting: Podiatry

## 2024-02-18 ENCOUNTER — Ambulatory Visit

## 2024-02-18 ENCOUNTER — Encounter: Payer: Self-pay | Admitting: Podiatry

## 2024-02-18 DIAGNOSIS — M7752 Other enthesopathy of left foot: Secondary | ICD-10-CM | POA: Diagnosis not present

## 2024-02-18 DIAGNOSIS — M7672 Peroneal tendinitis, left leg: Secondary | ICD-10-CM

## 2024-02-18 DIAGNOSIS — S92352K Displaced fracture of fifth metatarsal bone, left foot, subsequent encounter for fracture with nonunion: Secondary | ICD-10-CM | POA: Diagnosis not present

## 2024-02-18 DIAGNOSIS — R600 Localized edema: Secondary | ICD-10-CM | POA: Diagnosis not present

## 2024-02-18 DIAGNOSIS — M79672 Pain in left foot: Secondary | ICD-10-CM

## 2024-02-18 DIAGNOSIS — M7732 Calcaneal spur, left foot: Secondary | ICD-10-CM | POA: Diagnosis not present

## 2024-02-18 MED ORDER — IBUPROFEN 600 MG PO TABS: 600.0000 mg | ORAL_TABLET | Freq: Three times a day (TID) | ORAL | 0 refills | Status: AC | PRN
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
## 2024-02-18 NOTE — Patient Instructions (Signed)
 Peroneal Tendinopathy Rehab Ask your health care provider which exercises are safe for you. Do exercises exactly as told by your health care provider and adjust them as directed. It is normal to feel mild stretching, pulling, tightness, or discomfort as you do these exercises. Stop right away if you feel sudden pain or your pain gets worse. Do not begin these exercises until told by your health care provider. Stretching and range-of-motion exercises These exercises warm up your muscles and joints. They can help improve the movement and flexibility of your ankle. They may also help to relieve pain and stiffness. Gastrocnemius and soleus stretch, standing This is an exercise in which you stand on a step and use your body weight to stretch your calf muscles. To do this exercise: Stand on the edge of a step on the ball of your left / right foot. The ball of your foot is on the walking surface, right under your toes. Keep your other foot firmly on the same step. Hold on to the wall, a railing, or a chair for balance. Slowly lift your other foot, allowing your body weight to press your left / right heel down over the edge of the step. You should feel a stretch in your left / right calf (gastrocnemius and soleus). Hold this position for __________ seconds. Return both feet to the step. Repeat this exercise with a slight bend in your left / right knee. Repeat __________ times with your left / right knee straight and __________ times with your left / right knee bent. Complete this exercise __________ times a day. Strengthening exercises These exercises build strength and endurance in your foot and ankle. Endurance is the ability to use your muscles for a long time, even after they get tired. Ankle dorsiflexion with band  Secure a rubber exercise band or tube to an object, such as a table leg, that will not move when the band is pulled. Secure the other end of the band around your left / right foot. Sit on  the floor. Face the object with your left / right leg extended. The band or tube should be slightly tense when your foot is relaxed. Slowly flex your left / right ankle and toes to bring your foot toward you (dorsiflexion). Hold this position for __________ seconds. Let the band or tube slowly pull your foot back to the starting position. Repeat __________ times. Complete this exercise __________ times a day. Ankle eversion  Sit on the floor with your legs straight out in front of you. Loop a rubber exercise band or tube around the ball of your left / right foot. The ball of your foot is on the walking surface, right under your toes. Hold the ends of the band in your hands. You can also secure the band to a stable object. The band or tube should be slightly tense when your foot is relaxed. Slowly push your foot outward, away from your other leg (eversion). Hold this position for __________ seconds. Slowly return your foot to the starting position. Repeat __________ times. Complete this exercise __________ times a day. Plantar flexion, standing This exercise is sometimes called a standing heel raise. Stand with your feet shoulder-width apart. Place your hands on a wall or table to steady yourself as needed. Try not to use it for support. Keep your weight spread evenly over the width of your feet while you slowly rise up on your toes (plantar flexion). If told by your health care provider: Shift your weight  toward your left / right leg until you feel challenged. Stand on your left / right leg only. Hold this position for __________ seconds. Repeat __________ times. Complete this exercise __________ times a day. Single leg stand  Without shoes, stand near a railing or in a doorway. You may hold on to the railing or doorframe as needed. Stand on your left / right foot. Keep your big toe down on the floor and try to keep your arch lifted. Do not roll to the outside of your foot. If this  exercise is too easy, you can try it with your eyes closed or while standing on a pillow. Hold this position for __________ seconds. Repeat __________ times. Complete this exercise __________ times a day. This information is not intended to replace advice given to you by your health care provider. Make sure you discuss any questions you have with your health care provider. Document Revised: 08/28/2021 Document Reviewed: 08/28/2021 Elsevier Patient Education  2024 [employer].

## 2024-02-23 ENCOUNTER — Ambulatory Visit (HOSPITAL_COMMUNITY): Admission: EM | Admit: 2024-02-23 | Discharge: 2024-02-23 | Disposition: A | Discharge status: Home / Self Care

## 2024-02-23 ENCOUNTER — Encounter (HOSPITAL_COMMUNITY): Payer: Self-pay

## 2024-02-23 DIAGNOSIS — S5012XA Contusion of left forearm, initial encounter: Secondary | ICD-10-CM
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
## 2024-02-29 ENCOUNTER — Telehealth: Payer: Self-pay

## 2024-02-29 DIAGNOSIS — Z13820 Encounter for screening for osteoporosis: Secondary | ICD-10-CM

## 2024-02-29 DIAGNOSIS — C50911 Malignant neoplasm of unspecified site of right female breast: Secondary | ICD-10-CM

## 2024-02-29 NOTE — Telephone Encounter (Signed)
 Copied from CRM [id number]. Topic: General - Other >> Feb 29, 2024  1:27 PM Mercedes MATSU wrote: Reason for CRM: [facility] RN called in wanting to know if the patient did her bone density scan yet. She also wanted to know if someone from Dr. Wyn office can call and remind the patient to get that completed if she hasn't already. No call back to [facility] is needed at this time.    Contacted patient to inform her she needs a bone density scan. Unable to get in contact with patient LVMTCB

## 2024-03-01 NOTE — Telephone Encounter (Signed)
 ATC patient to inform her to get scheduled for her bone density scan. Unable to reach patient on 2nd attempt. LVMTCB

## 2024-03-02 DIAGNOSIS — J4551 Severe persistent asthma with (acute) exacerbation: Secondary | ICD-10-CM | POA: Diagnosis not present

## 2024-03-06 NOTE — Telephone Encounter (Signed)
 Patient called back to get scheduled for her bone density scan. Patient needs a referral to get scheduled

## 2024-03-08 DIAGNOSIS — I340 Nonrheumatic mitral (valve) insufficiency: Secondary | ICD-10-CM | POA: Diagnosis not present

## 2024-03-08 DIAGNOSIS — E119 Type 2 diabetes mellitus without complications: Secondary | ICD-10-CM | POA: Diagnosis not present

## 2024-03-08 DIAGNOSIS — R002 Palpitations: Secondary | ICD-10-CM | POA: Diagnosis not present

## 2024-03-08 DIAGNOSIS — I10 Essential (primary) hypertension: Secondary | ICD-10-CM | POA: Diagnosis not present

## 2024-03-08 DIAGNOSIS — E7800 Pure hypercholesterolemia, unspecified: Secondary | ICD-10-CM | POA: Diagnosis not present

## 2024-03-08 DIAGNOSIS — R0602 Shortness of breath: Secondary | ICD-10-CM | POA: Diagnosis not present

## 2024-03-09 NOTE — Telephone Encounter (Unsigned)
 Copied from CRM [id number]. Topic: Clinical - Request for Lab/Test Order >> Mar 08, 2024 11:48 AM Bianca Medina wrote: Reason for CRM: Pt called to follow up on request for bone density scan. She also wanted to inquire about a mammogram screening that she was told could be done at any time. Pls call pt.

## 2024-03-09 NOTE — Addendum Note (Signed)
 Addended by: BERNETA ELSIE LABOR on: 03/09/2024 05:25 PM   Modules accepted: Orders

## 2024-03-10 ENCOUNTER — Telehealth (HOSPITAL_BASED_OUTPATIENT_CLINIC_OR_DEPARTMENT_OTHER): Payer: Self-pay

## 2024-03-10 DIAGNOSIS — R262 Difficulty in walking, not elsewhere classified: Secondary | ICD-10-CM | POA: Diagnosis not present

## 2024-03-10 DIAGNOSIS — R600 Localized edema: Secondary | ICD-10-CM | POA: Diagnosis not present

## 2024-03-10 DIAGNOSIS — M25672 Stiffness of left ankle, not elsewhere classified: Secondary | ICD-10-CM | POA: Diagnosis not present

## 2024-03-14 NOTE — Telephone Encounter (Signed)
 Copied from CRM [id number]. Topic: General - Call Back - No Documentation >> Mar 14, 2024  2:01 PM Alfonso ORN wrote: Reason for CRM: pt called to relay message to nurse regarding body scan . Pt doesn't think another body scan should be done as she already had one and wants to know who had pcp call in order . Please confirm callback [phone] (H)

## 2024-03-15 NOTE — Telephone Encounter (Unsigned)
 Copied from CRM [id number]. Topic: General - Other >> Mar 15, 2024  8:42 AM Bianca Medina wrote: Reason for RMF:ejupzwu called wanting to know if the provider increased her  her atorvastatin  from 10 to 20mg  [phone]

## 2024-03-20 ENCOUNTER — Encounter: Payer: Self-pay | Admitting: Radiology

## 2024-03-22 ENCOUNTER — Other Ambulatory Visit (HOSPITAL_BASED_OUTPATIENT_CLINIC_OR_DEPARTMENT_OTHER)

## 2024-03-24 ENCOUNTER — Telehealth: Payer: Self-pay

## 2024-03-24 DIAGNOSIS — M25672 Stiffness of left ankle, not elsewhere classified: Secondary | ICD-10-CM | POA: Diagnosis not present

## 2024-03-24 DIAGNOSIS — R262 Difficulty in walking, not elsewhere classified: Secondary | ICD-10-CM | POA: Diagnosis not present

## 2024-03-24 DIAGNOSIS — R600 Localized edema: Secondary | ICD-10-CM | POA: Diagnosis not present

## 2024-03-24 NOTE — Telephone Encounter (Signed)
 Patient came in and demanded to speak with Dr. Suzann. I informed her that she can not speak directly to the provider but we can put in a message for Dr. Suzann. She stated that she wants a sooner appointment because she is have difficulty with her bowels and thinks she has locked bowels. She stated that her father had locked bowels and this is a concern to her. I informed her that we could get her in sooner with one of Dr. Andy APPS and she stated that she ONLY wants to work with Dr.McGreal. Patient was previously a [employer] patient please advise

## 2024-03-24 NOTE — Telephone Encounter (Signed)
 Attempted to reach patient. LVM advising we could get her in to see Dr Suzann 12/03 @ 11:10 but she would need to call back to confirm.

## 2024-03-24 NOTE — Telephone Encounter (Signed)
 Second attempt, left another VM to offer a held appointment. Advised we would need a return call today to place her on Dr Andy schedule 12/03

## 2024-03-25 ENCOUNTER — Other Ambulatory Visit: Payer: Self-pay

## 2024-03-25 ENCOUNTER — Emergency Department (HOSPITAL_COMMUNITY)
Admission: EM | Admit: 2024-03-25 | Discharge: 2024-03-25 | Discharge status: Against Medical Advice | Attending: Emergency Medicine | Admitting: Emergency Medicine

## 2024-03-25 ENCOUNTER — Encounter (HOSPITAL_COMMUNITY): Payer: Self-pay | Admitting: Pharmacy Technician

## 2024-03-25 DIAGNOSIS — Z5321 Procedure and treatment not carried out due to patient leaving prior to being seen by health care provider: Secondary | ICD-10-CM | POA: Insufficient documentation

## 2024-03-25 DIAGNOSIS — M25551 Pain in right hip: Secondary | ICD-10-CM | POA: Diagnosis not present

## 2024-03-25 DIAGNOSIS — M549 Dorsalgia, unspecified: Secondary | ICD-10-CM | POA: Insufficient documentation

## 2024-03-25 LAB — URINALYSIS, ROUTINE W REFLEX MICROSCOPIC
Bilirubin Urine: NEGATIVE
Glucose, UA: NEGATIVE mg/dL
Hgb urine dipstick: NEGATIVE
Ketones, ur: 5 mg/dL — AB
Leukocytes,Ua: NEGATIVE
Nitrite: NEGATIVE
Protein, ur: NEGATIVE mg/dL
Specific Gravity, Urine: 1.021 (ref 1.005–1.030)
pH: 6 (ref 5.0–8.0)

## 2024-03-25 NOTE — ED Triage Notes (Signed)
 Pt here POV with reports of one week of R sided back/hip pain. Denies known injury. Taking ibuprofen  with minimal relief. Endorses not urinating as much as normal.

## 2024-03-25 NOTE — ED Notes (Signed)
PT LEFT DUE TO LONG WAIT

## 2024-03-27 DIAGNOSIS — I10 Essential (primary) hypertension: Secondary | ICD-10-CM | POA: Diagnosis not present

## 2024-03-27 DIAGNOSIS — Z79899 Other long term (current) drug therapy: Secondary | ICD-10-CM | POA: Diagnosis not present

## 2024-03-27 DIAGNOSIS — I340 Nonrheumatic mitral (valve) insufficiency: Secondary | ICD-10-CM | POA: Diagnosis not present

## 2024-03-27 DIAGNOSIS — E7800 Pure hypercholesterolemia, unspecified: Secondary | ICD-10-CM | POA: Diagnosis not present

## 2024-03-27 DIAGNOSIS — R002 Palpitations: Secondary | ICD-10-CM | POA: Diagnosis not present

## 2024-03-27 DIAGNOSIS — R0789 Other chest pain: Secondary | ICD-10-CM | POA: Diagnosis not present

## 2024-03-27 DIAGNOSIS — R079 Chest pain, unspecified: Secondary | ICD-10-CM | POA: Diagnosis not present

## 2024-03-27 DIAGNOSIS — E119 Type 2 diabetes mellitus without complications: Secondary | ICD-10-CM | POA: Diagnosis not present

## 2024-03-27 DIAGNOSIS — R0602 Shortness of breath: Secondary | ICD-10-CM | POA: Diagnosis not present

## 2024-03-27 NOTE — Telephone Encounter (Signed)
 Patient returned call.  Unable to make 12/03.  Patient will keep original appointment.  Constipation improved so no as worried about an ASAP appointment.

## 2024-03-27 NOTE — Telephone Encounter (Signed)
 Left message for pt to call back

## 2024-03-29 ENCOUNTER — Telehealth: Payer: Self-pay | Admitting: Family Medicine

## 2024-03-29 NOTE — Telephone Encounter (Signed)
 Patient dropped off document look like med refills, to be filled out by provider. Patient requested to send it back via Call Patient to pick up within 7-days. Document is located in providers tray at front office.Please advise at Mobile [phone] (mobile) pt walked in with paperwork to drop off for dr berneta. I put in the dr box

## 2024-03-31 ENCOUNTER — Ambulatory Visit: Payer: Self-pay

## 2024-03-31 DIAGNOSIS — R600 Localized edema: Secondary | ICD-10-CM | POA: Diagnosis not present

## 2024-03-31 DIAGNOSIS — R262 Difficulty in walking, not elsewhere classified: Secondary | ICD-10-CM | POA: Diagnosis not present

## 2024-03-31 DIAGNOSIS — M25672 Stiffness of left ankle, not elsewhere classified: Secondary | ICD-10-CM | POA: Diagnosis not present

## 2024-03-31 NOTE — Telephone Encounter (Signed)
 1st attempt to contact the patient. No answer. Routing for additional attempts.     Copied from CRM #[id number]. Topic: Clinical - Red Word Triage >> Mar 31, 2024 12:13 PM Drema MATSU wrote: Red Word that prompted transfer to Nurse Triage: Patient states that both her foot and ankles are swollen and sore from water retention. She stated that therapy is helping but needs something for water retention. She is requesting for medication to be sent to pharmacy.

## 2024-03-31 NOTE — Telephone Encounter (Signed)
 Unable to reach patient for triage x 3 attempts.

## 2024-03-31 NOTE — Telephone Encounter (Signed)
 2nd attempt to contact patient-no answer. Voicemail left for patient to call back to Nurse Triage. Will place patient in callbacks.

## 2024-04-02 ENCOUNTER — Ambulatory Visit (HOSPITAL_COMMUNITY)
Admission: EM | Admit: 2024-04-02 | Discharge: 2024-04-02 | Disposition: A | Discharge status: Home / Self Care | Attending: Emergency Medicine | Admitting: Emergency Medicine

## 2024-04-02 ENCOUNTER — Other Ambulatory Visit: Payer: Self-pay

## 2024-04-02 ENCOUNTER — Encounter (HOSPITAL_COMMUNITY): Payer: Self-pay | Admitting: Emergency Medicine

## 2024-04-02 DIAGNOSIS — Z91148 Patient's other noncompliance with medication regimen for other reason: Secondary | ICD-10-CM | POA: Diagnosis not present

## 2024-04-02 DIAGNOSIS — I10 Essential (primary) hypertension: Secondary | ICD-10-CM

## 2024-04-02 DIAGNOSIS — R600 Localized edema: Secondary | ICD-10-CM | POA: Diagnosis not present

## 2024-04-02 MED ORDER — CHLORTHALIDONE 25 MG PO TABS: 25.0000 mg | ORAL_TABLET | Freq: Every day | ORAL | 1 refills | Status: AC

## 2024-04-02 NOTE — ED Notes (Signed)
 Using nebulizer and inhaler more frequently than usual.  Has used inhaler once today

## 2024-04-02 NOTE — ED Triage Notes (Signed)
 Bilateral lower leg pain and swelling.  Patient says she has had this issue for a week.   Went to ED last week for back pain, but was not seen.  Patient reports a history of dependant edema.  This episode is different due to soreness and duration.  Patient reports she is taking medicine  Patient states she is not regularly taking chlorthalidone , took it 2 times last week.    Patient has been keeping feet elevated

## 2024-04-02 NOTE — ED Provider Notes (Addendum)
 [facility]    CSN: [id number] Arrival date & time: 04/02/24  9048      History   Chief Complaint Chief Complaint  Patient presents with   Foot Pain   Edema    HPI Bianca Medina is a 73 y.o. female.  1 week of bilateral lower leg pain and swelling Maybe worse in the left foot yesterday, improving today.  Denies injury or trauma to the legs.  Not having current chest pain or pressure, shortness of breath, dizziness, syncope.  Tried elevating legs. Has compression stockings at home, not using.   Saw cardiology 6 days ago for exertional shortness of breath and palpitations. At that visit she reported mild edema. She told them she was taking all her meds daily.  Had chest xray, mild cardiomegally.   History of extremity edema. Also peroneal tendonitis left foot, seen last month by podiatry.  HTN history. Supposed to be on chlorthalidone . Has not been taking it regularly. Maybe twice in the last 2 weeks.   Past Medical History:  Diagnosis Date   Adenomatous colon polyp 2021   Anemia 2003   IRON  DEFICIENCY    Asthma    Breast cancer (HCC)    right breast   Chronic anal fissure    Common migraine 06/23/2019   Diabetes mellitus without complication (HCC)    GERD (gastroesophageal reflux disease)    Hearing loss    Hyperlipidemia, mild    Hypertension    IDA (iron  deficiency anemia)    NSVD (normal spontaneous vaginal delivery)    X3   Obesity    PMDD (premenstrual dysphoric disorder)    Sensorineural hearing loss (SNHL) of both ears    Sleep disorder     Patient Active Problem List   Diagnosis Date Noted   Abnormal weight gain 09/06/2023   Anemia 09/06/2023   Anorectal disorder 09/06/2023   Asthma 09/06/2023   Acute anal fissure 09/06/2023   Chronic anal fissure 09/06/2023   Chronic idiopathic constipation 09/06/2023   Family history of malignant neoplasm of colon 09/06/2023   Flatulence, eructation and gas pain 09/06/2023   Gastroesophageal  reflux disease 09/06/2023   Hemorrhage of anus and rectum 09/06/2023   Internal hemorrhoids 09/06/2023   Bone spur of inferior portion of left calcaneus 09/06/2023   Elevated LDL cholesterol level 08/26/2023   Recurrent cough 05/13/2023   Severe persistent asthma with exacerbation (HCC) 04/29/2023   DM hyperosmolarity type II, uncontrolled (HCC) 02/08/2023   Hypersomnia 02/08/2023   Non-restorative sleep 02/08/2023   Chronic eczematous otitis externa of both ears 01/28/2023   Nasal septal deviation 09/23/2022   Venous stasis of both lower extremities 09/04/2022   Arthritis of right knee 09/04/2022   Follow-up medical care requested by patient 09/04/2022   Concerned about care plan 09/04/2022   Vitamin E deficiency 07/22/2022   Leg cramps 07/22/2022   Facial droop 06/16/2022   Pain of left upper extremity 06/16/2022   Right knee pain 06/11/2022   Mood disorder 06/11/2022   Diverticulitis 04/26/2022   Cellulitis 04/26/2022   Leukocytosis 04/26/2022   Invasive ductal carcinoma of breast (HCC) 04/26/2022   Type 2 diabetes mellitus with complication, without long-term current use of insulin  (HCC) 04/26/2022   Throat swelling 03/26/2022   Slow transit constipation 01/26/2022   Malignant neoplasm of right breast in female, estrogen receptor positive (HCC) 01/09/2022   Malignant tumor of breast (HCC) 01/04/2022   Heart palpitations 12/16/2021   Pure hypercholesterolemia 12/16/2021   Left leg  swelling 11/10/2021   Hyperlipidemia 11/10/2021   Bruising 06/11/2021   Other schizoaffective disorders (HCC) 04/30/2021   Thyroid  nodule 04/30/2021   Screening for colon cancer 04/01/2021   Insect bite of right wrist 04/01/2021   Bite, insect 04/01/2021   Rash 03/28/2021   Iron  deficiency 03/19/2021   Need for influenza vaccination 03/19/2021   Snoring 01/27/2021   Sleep related bruxism 01/27/2021   Gasping for breath 01/27/2021   Insomnia due to anxiety and fear 01/27/2021   Class 3  severe obesity due to excess calories with serious comorbidity and body mass index (BMI) of 40.0 to 44.9 in adult [facility]) 01/27/2021   Medicare annual wellness visit, subsequent 01/24/2021   Nontraumatic coccydynia 01/09/2021   Rectal pain 01/09/2021   Dehydration 12/26/2020   History of syncope 12/26/2020   Hospital discharge follow-up 12/26/2020   Healthcare maintenance 12/01/2020   Vitamin D  deficiency 12/01/2020   Ear itching 06/27/2019   Common migraine 06/23/2019   History of anemia 03/31/2019   Pre-diabetes 03/31/2019   Viral syndrome 03/31/2019   Sensorineural hearing loss (SNHL), bilateral 08/12/2017   Tinnitus of both ears 08/12/2017   Morbid obesity (HCC) 12/28/2016   Left breast mass 07/19/2014   Breast tenderness in female 07/19/2014   Menopause 01/09/2014   Vaginal atrophy 01/09/2014   Skin lesion 12/23/2012   Essential hypertension 10/31/2012   Heartburn 10/31/2012    Past Surgical History:  Procedure Laterality Date   ABDOMINAL HYSTERECTOMY     BLADDER REPAIR     BREAST BIOPSY Right    2023   COLONOSCOPY  11/2019   1 hpp and 1 tubl adenoma   COLONOSCOPY WITH PROPOFOL  N/A 12/03/2021   Procedure: COLONOSCOPY WITH PROPOFOL ;  Surgeon: Dessa Reyes ORN, MD;  Location: [facility] ENDOSCOPY;  Service: Endoscopy;  Laterality: N/A;   GUM SURGERY  1980's   MANDIBLE FRACTURE SURGERY     TUBAL LIGATION      OB History     Gravida  3   Para  3   Term  3   Preterm      AB      Living  3      SAB      IAB      Ectopic      Multiple      Live Births               Home Medications    Prior to Admission medications   Medication Sig Start Date End Date Taking? Authorizing Provider  acetaminophen  (TYLENOL ) 500 MG tablet Take 500 mg by mouth every 6 (six) hours as needed for moderate pain (pain score 4-6).    [provider]  aspirin  EC 81 MG tablet Take 81 mg by mouth daily. Swallow whole.    [provider]  atorvastatin   (LIPITOR) 20 MG tablet Take 1 tablet (20 mg total) by mouth daily. 12/30/23   Berneta Elsie Sayre, MD  Biotin 5000 MCG CAPS Take 5,000 mcg by mouth daily.    [provider]  chlorthalidone  (HYGROTON ) 25 MG tablet Take 1 tablet (25 mg total) by mouth daily. 04/02/24   Emil Klassen, Asberry, PA-C  diclofenac  Sodium (VOLTAREN ) 1 % GEL Apply 2 g topically 4 (four) times daily. Patient taking differently: Apply 2 g topically as needed. 09/30/22   Gretta Bertrum ORN, PA-C  estradiol  (ESTRACE  VAGINAL) 0.1 MG/GM vaginal cream Place 1 Applicatorful vaginally 3 (three) times a week. Patient not taking: Reported on 02/04/2024 12/03/23  Nicholaus Almarie HERO, MD  estradiol  (ESTRACE  VAGINAL) 0.1 MG/GM vaginal cream Place 1 Applicatorful vaginally 3 (three) times a week. Patient not taking: Reported on 02/04/2024 12/10/23   Constant, Peggy, MD  Fluocinolone Acetonide 0.01 % OIL Drops to affected ear(s) once or twice daily as needed for itching. 08/09/23   [provider]  fluticasone  (FLONASE ) 50 MCG/ACT nasal spray Place 2 sprays into both nostrils daily. 12/24/22   Berneta Elsie Sayre, MD  gabapentin  (NEURONTIN ) 300 MG capsule Take 1 capsule (300 mg total) by mouth at bedtime. Take at bedtime for 5 days and then can increase to twice daily if tolerating well. Patient not taking: Reported on 01/11/2024 12/01/23   Vernetta Lonni GRADE, MD  ipratropium-albuterol  (DUONEB) 0.5-2.5 (3) MG/3ML SOLN Take 3 mLs by nebulization every 6 (six) hours as needed (wheezing). 04/29/23   Sebastian Beverley NOVAK, MD  losartan  (COZAAR ) 25 MG tablet Take 25 mg by mouth daily. 07/23/21   [provider]  metFORMIN  (GLUCOPHAGE -XR) 500 MG 24 hr tablet TAKE 1 TABLET BY MOUTH 2 TIMES DAILY WITH A MEAL. 06/07/23   Berneta Elsie Sayre, MD  montelukast  (SINGULAIR ) 10 MG tablet TAKE 1 TABLET BY MOUTH EVERY DAY FOR 30 DAYS for 90    [provider]  Potassium Chloride  ER 20 MEQ TBCR Take 1 tablet by mouth daily. 11/10/21    [provider]  predniSONE  (DELTASONE ) 50 MG tablet Take 1 tablet daily for 5 days. Patient not taking: Reported on 01/11/2024 04/29/23   Sebastian Beverley NOVAK, MD  PROAIR  HFA 108 [phone] Base) MCG/ACT inhaler Inhale 1-2 puffs into the lungs every 4 (four) hours as needed for wheezing or shortness of breath. 03/16/18   [provider]  STATUS COVID-19/FLU A&B KIT TEST AS DIRECTED TODAY Patient not taking: Reported on 01/11/2024 06/28/23   [provider]  SYMBICORT 80-4.5 MCG/ACT inhaler Inhale 2 puffs into the lungs 2 (two) times daily. 12/08/21   [provider]  triamcinolone  cream (KENALOG ) 0.1 % Apply topically 2 (two) times daily. 05/13/22   [provider]    Family History Family History  Problem Relation Age of Onset   Cancer Mother        LUNG- SMOKER   Diabetes Father    Hypertension Father    Heart disease Father    Stroke Father    Breast cancer Sister        lates 58s   Cancer Paternal Aunt        COLON   Cancer Paternal Uncle        COLON    Social History Social History   Tobacco Use   Smoking status: Never   Smokeless tobacco: Never  Vaping Use   Vaping status: Never Used  Substance Use Topics   Alcohol use: Not Currently   Drug use: Never     Allergies   Codeine   Review of Systems Review of Systems As per HPI  Physical Exam Triage Vital Signs ED Triage Vitals [04/02/24 1040]  Encounter Vitals Group     BP      Girls Systolic BP Percentile      Girls Diastolic BP Percentile      Boys Systolic BP Percentile      Boys Diastolic BP Percentile      Pulse      Resp      Temp      Temp src      SpO2      Weight  Height      Head Circumference      Peak Flow      Pain Score 6     Pain Loc      Pain Education      Exclude from Growth Chart    No data found.  Updated Vital Signs BP 137/63 (BP Location: Left Arm)   Pulse 65   Temp 98 F (36.7 C) (Oral)   Resp 18   SpO2 94%   Physical  Exam Vitals and nursing note reviewed.  Constitutional:      General: She is not in acute distress.    Appearance: Normal appearance.  HENT:     Mouth/Throat:     Pharynx: Oropharynx is clear.  Cardiovascular:     Rate and Rhythm: Normal rate. Rhythm irregular.     Pulses: Normal pulses.          Radial pulses are 2+ on the right side and 2+ on the left side.     Heart sounds: Normal heart sounds.     Comments: Occasional palpitations, on auscultation and radial palpation. 1+ edema of bilateral lower extremities, not pitting. Extends to just below mid-shin. DP pulses 2+. Cannot assess PT due to swelling. Cap refill toes 1 second. Distal sensation equal, intact  Pulmonary:     Effort: Pulmonary effort is normal.     Breath sounds: Normal breath sounds.  Abdominal:     Palpations: Abdomen is soft.     Tenderness: There is no abdominal tenderness.  Musculoskeletal:     Right lower leg: 1+ Edema present.     Left lower leg: 1+ Edema present.  Neurological:     Mental Status: She is alert and oriented to person, place, and time.      UC Treatments / Results  Labs (all labs ordered are listed, but only abnormal results are displayed) Labs Reviewed - No data to display  EKG  Radiology No results found.  Procedures Procedures   Medications Ordered in UC Medications - No data to display  Initial Impression / Assessment and Plan / UC Course  I have reviewed the triage vital signs and the nursing notes.  Pertinent labs & imaging results that were available during my care of the patient were reviewed by me and considered in my medical decision making (see chart for details).  Stable vitals. BP normotensive on recheck. Clear lungs She has been having palpitations for several months, just saw cardiology 6 days ago. They have not been worsening since being seen. Present on exam today. She is not having any chest pain or tightness, shortness of breath, dizziness.   CMP done 6  days ago, potassium was 4. BNP was 66 Noncompliance with her diuretic the last 2 weeks although she told cardiologist she was taking it.  I have recommended she take her chlorthalidone  daily as directed. Discussed this medicine is a diuretic that will reduce her leg swelling. Additionally recommend compression stockings and elevation of legs.  Understands ED precautions. Cardiology follow up if symptoms not improving.  Patient is agreeable to plan, no questions at this time.  Final Clinical Impressions(s) / UC Diagnoses   Final diagnoses:  Bilateral lower extremity edema  Noncompliance with medication regimen  Essential hypertension     Discharge Instructions      Please start taking your chlorthalidone  once every single day. This is a diuretic that will reduce the fluid buildup in your legs. Also wear your compression stockings, and elevate the  legs as often as able.  Please call your cardiologist for a follow up visit if you don't notice improvement in symptoms after about a week.     ED Prescriptions     Medication Sig Dispense Auth. Provider   chlorthalidone  (HYGROTON ) 25 MG tablet Take 1 tablet (25 mg total) by mouth daily. 30 tablet [place], Asberry, PA-C      PDMP not reviewed this encounter.   Waylan Busta, Asberry RIGGERS 04/02/24 1202    Makelle Marrone, Asberry, PA-C 04/02/24 1203

## 2024-04-02 NOTE — Discharge Instructions (Signed)
 Please start taking your chlorthalidone  once every single day. This is a diuretic that will reduce the fluid buildup in your legs. Also wear your compression stockings, and elevate the legs as often as able.  Please call your cardiologist for a follow up visit if you don't notice improvement in symptoms after about a week.

## 2024-04-04 ENCOUNTER — Telehealth (HOSPITAL_BASED_OUTPATIENT_CLINIC_OR_DEPARTMENT_OTHER): Payer: Self-pay

## 2024-04-05 DIAGNOSIS — R002 Palpitations: Secondary | ICD-10-CM | POA: Diagnosis not present

## 2024-04-05 NOTE — Procedures (Signed)
 72-hour Holter monitor 03/27/2024 - 03/30/2024 revealed   1.  Predominant sinus rhythm, mean heart rate 80 bpm, range 45 to 117 bpm  2.  Frequent premature atrial contractions (17%)  3.  Occasional premature ventricular contractions  4.  Frequent brief runs of SVT, longest lasting 13 beats  5.  No diary entries

## 2024-04-07 NOTE — Telephone Encounter (Signed)
 Copied from CRM #[id number]. Topic: General - Other >> Apr 07, 2024  9:45 AM Laymon HERO wrote: Reason for CRM: Patient asking for Lafe Flonnie HERO, CMA to return call to discuss paperwork and medications

## 2024-04-10 ENCOUNTER — Telehealth: Payer: Self-pay

## 2024-04-10 NOTE — Telephone Encounter (Signed)
 Copied from CRM #[id number]. Topic: Clinical - Medication Question >> Apr 10, 2024  5:08 PM Nessti S wrote: Reason for CRM: pt called because she would like atorvastatin  (LIPITOR) 20 MG tablet to be decreased to 10MG . She would like a call back concerning this issued [phone]   ----------------------------------------------------------------------- From previous Reason for Contact - Prescription Issue: Reason for CRM:

## 2024-04-11 NOTE — Telephone Encounter (Signed)
 Patient requesting a decrease of her atorvastatin . Attempted to call Patient to see the reasoning why, no answer from patient

## 2024-04-12 NOTE — Telephone Encounter (Signed)
 Contacted patient and informed her of Dr. Berneta message. Patient expressed understanding, Nothing further need.

## 2024-04-17 DIAGNOSIS — R262 Difficulty in walking, not elsewhere classified: Secondary | ICD-10-CM | POA: Diagnosis not present

## 2024-04-17 DIAGNOSIS — R600 Localized edema: Secondary | ICD-10-CM | POA: Diagnosis not present

## 2024-04-17 DIAGNOSIS — M25672 Stiffness of left ankle, not elsewhere classified: Secondary | ICD-10-CM | POA: Diagnosis not present

## 2024-04-24 DIAGNOSIS — R262 Difficulty in walking, not elsewhere classified: Secondary | ICD-10-CM | POA: Diagnosis not present

## 2024-04-24 DIAGNOSIS — M25672 Stiffness of left ankle, not elsewhere classified: Secondary | ICD-10-CM | POA: Diagnosis not present

## 2024-04-24 DIAGNOSIS — R600 Localized edema: Secondary | ICD-10-CM | POA: Diagnosis not present

## 2024-04-30 ENCOUNTER — Other Ambulatory Visit: Payer: Self-pay | Admitting: Family

## 2024-05-03 LAB — HM MAMMOGRAPHY

## 2024-05-03 NOTE — Progress Notes (Unsigned)
 [facility] Initial Consultation   Referring Provider Berneta Elsie Sayre, MD [address] [place],  [place] [address]  Primary Care Provider Berneta Elsie Sayre, MD  Patient Profile: Bianca Medina is a 73 y.o. female who is seen in consultation in the [facility] at the request of Dr. Berneta for evaluation and management of the problem(s) noted below.  Problem List: Constipation Coccygeal pain Diverticulitis GERD  History of Present Illness     Discussed the use of AI scribe software for clinical note transcription with the patient, who gave verbal consent to proceed.  History of Present Illness Bianca Medina is a 73 year old female with a past medical history noteworthy for HTN, HLD, cancer, migraine headache, asthma, T2DM, thyroid  nodule, obesity, syncope, schizoaffective disorder who returns to the gastroenterology office for follow-up of constipation and rectal pain.  Bianca Medina was last seen by Dr. Leigh 07/16/2021 Previously followed by Dr. Kristie 2012, [facility] 2022      GI Review of Symptoms Significant for {[id number]}. Otherwise negative.  General Review of Systems  Review of systems is significant for the pertinent positives and negatives as listed per the HPI.  Full ROS is otherwise negative.  Past Medical History   Past Medical History:  Diagnosis Date   Adenomatous colon polyp 2021   Anemia 2003   IRON  DEFICIENCY    Asthma    Breast cancer (HCC)    right breast   Chronic anal fissure    Common migraine 06/23/2019   Diabetes mellitus without complication (HCC)    GERD (gastroesophageal reflux disease)    Hearing loss    Hyperlipidemia, mild    Hypertension    IDA (iron  deficiency anemia)    NSVD (normal spontaneous vaginal delivery)    X3   Obesity    PMDD (premenstrual dysphoric disorder)    Sensorineural hearing loss (SNHL) of both ears    Sleep disorder      Past Surgical History   Past  Surgical History:  Procedure Laterality Date   ABDOMINAL HYSTERECTOMY     BLADDER REPAIR     BREAST BIOPSY Right    2023   COLONOSCOPY  11/2019   1 hpp and 1 tubl adenoma   COLONOSCOPY WITH PROPOFOL  N/A 12/03/2021   Procedure: COLONOSCOPY WITH PROPOFOL ;  Surgeon: Dessa Reyes LELON, MD;  Location: [facility] ENDOSCOPY;  Service: Endoscopy;  Laterality: N/A;   GUM SURGERY  1980's   MANDIBLE FRACTURE SURGERY     TUBAL LIGATION       Allergies and Medications   Allergies[1]  @MEDSTODAY @  Family History   Family History  Problem Relation Age of Onset   Cancer Mother        LUNG- SMOKER   Diabetes Father    Hypertension Father    Heart disease Father    Stroke Father    Breast cancer Sister        lates 41s   Cancer Paternal Aunt        COLON   Cancer Paternal Uncle        COLON     Social History   Social History[2] Bianca Medina reports that she has never smoked. She has never used smokeless tobacco. She reports that she does not currently use alcohol. She reports that she does not use drugs.  Vital Signs and Physical Examination   There were no vitals filed for this visit. There is no height or weight on file to calculate BMI.    General:  Well developed, well nourished, no acute distress Head: Normocephalic and atraumatic Eyes: Sclerae anicteric, EOMI Ears: Normal auditory acuity Mouth: No deformities or lesions noted Lungs: Clear throughout to auscultation Heart: Regular rate and rhythm; No murmurs, rubs or bruits Abdomen: Soft, non tender and non distended. No masses, hepatosplenomegaly or hernias noted. Normal Bowel sounds Rectal: Musculoskeletal: Symmetrical with no gross deformities  Pulses:  Normal pulses noted Extremities: No edema or deformities noted Neurological: Alert oriented x 4, grossly nonfocal Psychological:  Alert and cooperative. Normal mood and affect  Review of Data  The following data was reviewed at the time of this encounter:  Laboratory  Studies      Latest Ref Rng & Units 01/23/2024   10:38 AM 08/23/2023   10:00 AM 06/28/2023    3:37 PM  CBC  WBC 4.0 - 10.5 K/uL 8.9   10.3   Hemoglobin 12.0 - 15.0 g/dL 86.8  86.3  87.6   Hematocrit 36.0 - 46.0 % 39.6  40.0  37.1   Platelets 150 - 400 K/uL 264   254     Lab Results  Component Value Date   LIPASE 24 04/26/2022      Latest Ref Rng & Units 01/23/2024   10:38 AM 12/28/2023    3:12 PM 08/23/2023   10:00 AM  CMP  Glucose 70 - 99 mg/dL 858  882  868   BUN 8 - 23 mg/dL 16  17  25    Creatinine 0.44 - 1.00 mg/dL 9.25  9.25  9.09   Sodium 135 - 145 mmol/L 137  138  139   Potassium 3.5 - 5.1 mmol/L 3.9  4.0  4.4   Chloride 98 - 111 mmol/L 101  101  101   CO2 22 - 32 mmol/L 29  30    Calcium  8.9 - 10.3 mg/dL 8.8  8.9    Total Protein 6.5 - 8.1 g/dL 6.6     Total Bilirubin 0.0 - 1.2 mg/dL 0.9     Alkaline Phos 38 - 126 U/L 85     AST 15 - 41 U/L 18     ALT 0 - 44 U/L 17        Imaging Studies  CTAP 04/26/2022 1. Acute diverticulitis involving the distal descending colon. No evidence for perforation or abscess. 2. Small hiatal hernia.  CTAP 07/25/2018 indication of indeterminant pancreatic lesion on ultrasound 1. No pancreatic lesion or peripancreatic lesion identified. 2. Abnormality described on comparison ultrasound is favored a pseudo lesion presumably representing a loop of bowel. 3. No lymphadenopathy. 4. Benign LEFT adrenal adenoma.  GI Procedures and Studies  Colonoscopy 12/03/2021 by Dr. Dessa Diverticulosis in rectosigmoid colon, otherwise normal Normal retroflexion Palpation of coccyx was unremarkable  Patient reports a colonoscopy 09/2015-no report available  Colonoscopy 08/04/2010 by Dr. Kristie 3 rectal hyperplastic polyps   Clinical Impression  It is my clinical impression that Bianca Medina is a 73 y.o. female with;  ***  Plan  *** *** *** *** ***  Planned Follow Up No follow-ups on file.  The patient or caregiver verbalized understanding  of the material covered, with no barriers to understanding. All questions were answered. Patient or caregiver is agreeable with the plan outlined above.    It was a pleasure to see Bianca Medina.  If you have any questions or concerns regarding this evaluation, do not hesitate to contact me.  Inocente Hausen, MD [facility]     [1]  Allergies Allergen Reactions   Codeine Other (  See Comments)    nightmares  Other Reaction(s): Other (See Comments)    nightmares  [2]  Social History Tobacco Use   Smoking status: Never   Smokeless tobacco: Never  Vaping Use   Vaping status: Never Used  Substance Use Topics   Alcohol use: Not Currently   Drug use: Never

## 2024-05-04 ENCOUNTER — Ambulatory Visit: Admitting: Pediatrics

## 2024-05-04 ENCOUNTER — Encounter: Payer: Self-pay | Admitting: Pediatrics

## 2024-05-04 ENCOUNTER — Ambulatory Visit: Payer: Self-pay | Admitting: Family Medicine

## 2024-05-04 VITALS — BP 150/78 | HR 73 | Ht 65.0 in | Wt 274.1 lb

## 2024-05-04 DIAGNOSIS — Z8739 Personal history of other diseases of the musculoskeletal system and connective tissue: Secondary | ICD-10-CM | POA: Diagnosis not present

## 2024-05-04 DIAGNOSIS — K5900 Constipation, unspecified: Secondary | ICD-10-CM

## 2024-05-04 DIAGNOSIS — R140 Abdominal distension (gaseous): Secondary | ICD-10-CM | POA: Diagnosis not present

## 2024-05-04 DIAGNOSIS — K625 Hemorrhage of anus and rectum: Secondary | ICD-10-CM

## 2024-05-04 DIAGNOSIS — K5909 Other constipation: Secondary | ICD-10-CM

## 2024-05-04 DIAGNOSIS — Z800 Family history of malignant neoplasm of digestive organs: Secondary | ICD-10-CM

## 2024-05-04 DIAGNOSIS — K219 Gastro-esophageal reflux disease without esophagitis: Secondary | ICD-10-CM | POA: Diagnosis not present

## 2024-05-04 DIAGNOSIS — Z8719 Personal history of other diseases of the digestive system: Secondary | ICD-10-CM

## 2024-05-04 MED ORDER — HYDROCORTISONE ACETATE 25 MG RE SUPP: 25.0000 mg | Freq: Two times a day (BID) | RECTAL | 1 refills | Status: AC

## 2024-05-04 MED ORDER — LINACLOTIDE 290 MCG PO CAPS: ORAL_CAPSULE | ORAL | 0 refills | Status: AC

## 2024-05-04 NOTE — Patient Instructions (Addendum)
 BOWEL PURGE:   Purchase 1 (one) 119 GRAM bottle of Miralax  Purchase 1 (one) 32 ounce bottle of Gatorade   STEPS:   Mix the entire bottle of Miralax  in the 32 ounces of room temperature Gatorade and stir to dissolve completely. Drink the Miralax  solution you have prepared. You will drink this mixture over the next 2-3 hours.  You should expect results within 1 to 6 hours after completing this bowel purge.   After your bowel purge:  Start Linzess  290 mcg.  Take 1 capsule daily 20-30 minutes before breakfast.  We have sent the following medications to your pharmacy for you to pick up at your convenience: Hydrocortisone  suppository 25 mg.  Insert 1 suppository twice a day as needed for bleeding hemorrhoids.  Follow up in 3 months.  Thank you for entrusting me with your care and for choosing [facility], Dr. Inocente Hausen  _______________________________________________________  If your blood pressure at your visit was 140/90 or greater, please contact your primary care physician to follow up on this.  _______________________________________________________  If you are age 72 or older, your body mass index should be between 23-30. Your Body mass index is 45.62 kg/m. If this is out of the aforementioned range listed, please consider follow up with your Primary Care Provider.  If you are age 25 or younger, your body mass index should be between 19-25. Your Body mass index is 45.62 kg/m. If this is out of the aformentioned range listed, please consider follow up with your Primary Care Provider.   ________________________________________________________  The [facility] providers would like to encourage you to use MYCHART to communicate with providers for non-urgent requests or questions.  Due to long hold times on the telephone, sending your provider a message by [facility] may be a faster and more efficient way to get a response.  Please allow 48 business hours for a response.   Please remember that this is for non-urgent requests.  _______________________________________________________  [facility] is using a team-based approach to care.  Your team is made up of your doctor and two to three APPS. Our APPS (Nurse Practitioners and Physician Assistants) work with your physician to ensure care continuity for you. They are fully qualified to address your health concerns and develop a treatment plan. They communicate directly with your gastroenterologist to care for you. Seeing the Advanced Practice Practitioners on your physician's team can help you by facilitating care more promptly, often allowing for earlier appointments, access to diagnostic testing, procedures, and other specialty referrals.

## 2024-05-09 ENCOUNTER — Ambulatory Visit

## 2024-05-09 DIAGNOSIS — M7989 Other specified soft tissue disorders: Secondary | ICD-10-CM

## 2024-05-09 NOTE — Progress Notes (Signed)
 "  Subjective:  Patient ID: Bianca Medina, female    DOB: 1950-10-23,  MRN: [id number]  Chief Complaint  Patient presents with   Foot Pain    Patient is here for left foot pain and swelling, patient is diabetic and on diuretic, last A1C 7.5 Patient is doing PT, has compression sock does not use. Has triloc brace, night splint, and air fracture walker but still having issues    Discussed the use of AI scribe software for clinical note transcription with the patient, who gave verbal consent to proceed.  History of Present Illness Bianca Medina is a 73 year old female who presents with progressive left foot and leg pain and swelling.  Over the past several months she has had worsening left foot and leg pain and swelling, with pain worse at night. Swelling has increased and she notes an area on the foot where the skin was previously dark. About one month ago she developed a new abnormal sensation on the lateral foot, followed by increased pain in the foot radiating up the leg. Soreness extends from the ankle up the leg and tenderness is significant enough to limit physical therapy interventions.  She has been in foot and ankle physical therapy for several months, now about once weekly. She notes moderate improvement with PT. She wears compression socks during the day and a brace at night. Her last PT course just ended and she plans to resume after the holidays.  She previously used a topical cream from dermatology for the dark indentation on the foot. She has not had this type of pain before. She recalls a prior negative evaluation for blood clot, though she is unsure when and does not know if Doppler or DVT testing was done.     Review of Systems: Negative except as noted in the HPI. Denies N/V/F/Ch.  Past Medical History:  Diagnosis Date   Adenomatous colon polyp 2021   Anemia 2003   IRON  DEFICIENCY    Asthma    Breast cancer (HCC)    right breast   Chronic anal fissure    Common  migraine 06/23/2019   Diabetes mellitus without complication (HCC)    GERD (gastroesophageal reflux disease)    Hearing loss    Hyperlipidemia, mild    Hypertension    IDA (iron  deficiency anemia)    NSVD (normal spontaneous vaginal delivery)    X3   Obesity    PMDD (premenstrual dysphoric disorder)    Sensorineural hearing loss (SNHL) of both ears    Sleep disorder    Current Medications[1]  Tobacco Use History[2]  Allergies[3] Objective:   Constitutional Well developed. Well nourished. Oriented to person, place, and time.  Vascular Dorsalis pedis pulses palpable bilaterally. Posterior tibial pulses palpable bilaterally. Capillary refill normal to all digits.  No cyanosis or clubbing noted. Pedal hair growth normal.  Neurologic Normal speech. Epicritic sensation to light touch grossly intact bilaterally. Negative tinel sign at tarsal tunnel bilaterally.  Mild tenderness and tightness left lower leg. No frank erythema.   Dermatologic Skin texture and turgor are within normal limits.  No open wounds. No skin lesions.  Musculoskeletal: MMT 5/5 bilateral lower extremities in DF, PF, Inversion and Eversion. Deceased ROM in DF of ankle joint. Mild pain to palpation to the outside of the foot near the base of the fifth metatarsal in the area of the peroneal tendon. No pain with everesion inversion DF or PF. No pain elsewhere about the foot.  Previously taken radiographs 02/18/24: Remote nonunited left fifth metatarsal base avulsion fracture. No acute osseous findings such as fracture or dislocation.     Assessment:   1. Left leg swelling      Plan:  Patient was evaluated and treated and all questions answered.  Assessment and Plan Assessment & Plan Left lower extremity pain and swelling Chronic left foot and leg pain and swelling with unclear etiology. Physical therapy assisting Further evaluation needed due to chronicity and severity. - Continue current physical  therapy. - If physical therapy ineffective, order MRI to evaluate etiology. - Schedule follow-up after physical therapy.  Suspected deep vein thrombosis, left lower extremity Unilateral swelling and pain suggest possible DVT, though low suspicion for acute thrombosis. Doppler ultrasound needed to rule out DVT. - Order Doppler ultrasound of left lower extremity.  RTC after doppler results  Prentice Ovens, DPM AACFAS Fellowship Trained Podiatric Surgeon [facility]     [1]  Current Outpatient Medications:    acetaminophen  (TYLENOL ) 500 MG tablet, Take 500 mg by mouth every 6 (six) hours as needed for moderate pain (pain score 4-6)., Disp: , Rfl:    aspirin  EC 81 MG tablet, Take 81 mg by mouth daily. Swallow whole., Disp: , Rfl:    atorvastatin  (LIPITOR) 20 MG tablet, Take 1 tablet (20 mg total) by mouth daily., Disp: 90 tablet, Rfl: 3   Biotin 5000 MCG CAPS, Take 5,000 mcg by mouth daily., Disp: , Rfl:    chlorthalidone  (HYGROTON ) 25 MG tablet, Take 1 tablet (25 mg total) by mouth daily., Disp: 30 tablet, Rfl: 1   diclofenac  Sodium (VOLTAREN ) 1 % GEL, Apply 2 g topically 4 (four) times daily. (Patient taking differently: Apply 2 g topically as needed.), Disp: 350 g, Rfl: 1   estradiol  (ESTRACE  VAGINAL) 0.1 MG/GM vaginal cream, Place 1 Applicatorful vaginally 3 (three) times a week., Disp: 42.5 g, Rfl: 0   Fluocinolone Acetonide 0.01 % OIL, Drops to affected ear(s) once or twice daily as needed for itching., Disp: , Rfl:    hydrocortisone  (ANUSOL -HC) 25 MG suppository, Place 1 suppository (25 mg total) rectally 2 (two) times daily., Disp: 60 suppository, Rfl: 1   ipratropium-albuterol  (DUONEB) 0.5-2.5 (3) MG/3ML SOLN, Take 3 mLs by nebulization every 6 (six) hours as needed (wheezing)., Disp: 360 mL, Rfl: 0   linaclotide  (LINZESS ) 290 MCG CAPS capsule, Take one capsule 30 min before breakfast., Disp: 16 capsule, Rfl: 0   losartan  (COZAAR ) 25 MG tablet, Take 25 mg by mouth  daily., Disp: , Rfl:    metFORMIN  (GLUCOPHAGE -XR) 500 MG 24 hr tablet, TAKE 1 TABLET BY MOUTH 2 TIMES DAILY WITH A MEAL., Disp: 180 tablet, Rfl: 1   montelukast  (SINGULAIR ) 10 MG tablet, TAKE 1 TABLET BY MOUTH EVERY DAY FOR 30 DAYS for 90, Disp: , Rfl:    Potassium Chloride  ER 20 MEQ TBCR, Take 1 tablet by mouth daily., Disp: , Rfl:    PROAIR  HFA 108 (90 Base) MCG/ACT inhaler, Inhale 1-2 puffs into the lungs every 4 (four) hours as needed for wheezing or shortness of breath., Disp: , Rfl:    STATUS COVID-19/FLU A&B KIT, TEST AS DIRECTED TODAY, Disp: , Rfl:    triamcinolone  cream (KENALOG ) 0.1 %, Apply topically 2 (two) times daily., Disp: , Rfl:    estradiol  (ESTRACE  VAGINAL) 0.1 MG/GM vaginal cream, Place 1 Applicatorful vaginally 3 (three) times a week. (Patient not taking: Reported on 05/04/2024), Disp: 42.5 g, Rfl: 3   fluticasone  (FLONASE ) 50 MCG/ACT  nasal spray, Place 2 sprays into both nostrils daily. (Patient not taking: Reported on 05/04/2024), Disp: 16 g, Rfl: 6   gabapentin  (NEURONTIN ) 300 MG capsule, Take 1 capsule (300 mg total) by mouth at bedtime. Take at bedtime for 5 days and then can increase to twice daily if tolerating well. (Patient not taking: Reported on 05/04/2024), Disp: 60 capsule, Rfl: 0   predniSONE  (DELTASONE ) 50 MG tablet, Take 1 tablet daily for 5 days. (Patient not taking: Reported on 05/04/2024), Disp: 5 tablet, Rfl: 0   SYMBICORT 80-4.5 MCG/ACT inhaler, Inhale 2 puffs into the lungs 2 (two) times daily. (Patient not taking: Reported on 05/04/2024), Disp: , Rfl:  [2]  Social History Tobacco Use  Smoking Status Never  Smokeless Tobacco Never  [3]  Allergies Allergen Reactions   Codeine Other (See Comments)    nightmares  Other Reaction(s): Other (See Comments)    nightmares   "

## 2024-05-12 ENCOUNTER — Emergency Department (HOSPITAL_COMMUNITY)

## 2024-05-12 ENCOUNTER — Encounter (HOSPITAL_COMMUNITY): Payer: Self-pay

## 2024-05-12 ENCOUNTER — Telehealth: Payer: Self-pay | Admitting: Family Medicine

## 2024-05-12 ENCOUNTER — Emergency Department (HOSPITAL_COMMUNITY)
Admission: EM | Admit: 2024-05-12 | Discharge: 2024-05-12 | Disposition: A | Discharge status: Home / Self Care | Attending: Emergency Medicine | Admitting: Emergency Medicine

## 2024-05-12 ENCOUNTER — Other Ambulatory Visit: Payer: Self-pay

## 2024-05-12 DIAGNOSIS — Z7984 Long term (current) use of oral hypoglycemic drugs: Secondary | ICD-10-CM | POA: Insufficient documentation

## 2024-05-12 DIAGNOSIS — Z79899 Other long term (current) drug therapy: Secondary | ICD-10-CM | POA: Insufficient documentation

## 2024-05-12 DIAGNOSIS — E119 Type 2 diabetes mellitus without complications: Secondary | ICD-10-CM | POA: Insufficient documentation

## 2024-05-12 DIAGNOSIS — Z7982 Long term (current) use of aspirin: Secondary | ICD-10-CM | POA: Insufficient documentation

## 2024-05-12 DIAGNOSIS — J45909 Unspecified asthma, uncomplicated: Secondary | ICD-10-CM | POA: Insufficient documentation

## 2024-05-12 DIAGNOSIS — I10 Essential (primary) hypertension: Secondary | ICD-10-CM | POA: Insufficient documentation

## 2024-05-12 DIAGNOSIS — M7989 Other specified soft tissue disorders: Secondary | ICD-10-CM | POA: Diagnosis present

## 2024-05-12 LAB — CBC
HCT: 37.3 % (ref 36.0–46.0)
Hemoglobin: 12.3 g/dL (ref 12.0–15.0)
MCH: 25.4 pg — ABNORMAL LOW (ref 26.0–34.0)
MCHC: 33 g/dL (ref 30.0–36.0)
MCV: 76.9 fL — ABNORMAL LOW (ref 80.0–100.0)
Platelets: 257 K/uL (ref 150–400)
RBC: 4.85 MIL/uL (ref 3.87–5.11)
RDW: 14.3 % (ref 11.5–15.5)
WBC: 9.5 K/uL (ref 4.0–10.5)
nRBC: 0 % (ref 0.0–0.2)

## 2024-05-12 LAB — BASIC METABOLIC PANEL WITH GFR
Anion gap: 7 (ref 5–15)
BUN: 17 mg/dL (ref 8–23)
CO2: 29 mmol/L (ref 22–32)
Calcium: 9 mg/dL (ref 8.9–10.3)
Chloride: 103 mmol/L (ref 98–111)
Creatinine, Ser: 0.77 mg/dL (ref 0.44–1.00)
GFR, Estimated: >60 mL/min
Glucose, Bld: 117 mg/dL — ABNORMAL HIGH (ref 70–99)
Potassium: 4.3 mmol/L (ref 3.5–5.1)
Sodium: 139 mmol/L (ref 135–145)

## 2024-05-12 NOTE — ED Triage Notes (Signed)
 Pt c/o L foot, ankle, and lower leg pain and swelling x 1 week; leg red and warm to tough; saw podiatrist Dr. Magdalen on Monday for same; denies fevers, denies hx of same, states she felt like something went into the side of her foot x 1 month ago

## 2024-05-12 NOTE — Telephone Encounter (Signed)
 Noted. Dm/cma

## 2024-05-12 NOTE — ED Provider Notes (Signed)
 " [facility] Provider Note   CSN: [id number] Arrival date & time: 05/12/24  9150     Patient presents with: Leg Pain and Leg Swelling   ANALEISE MCCLEERY is a 73 y.o. female.  Leg Pain Patient is a 73 year old female presenting ED today for progressive left leg swelling and pain.  Noted to have been seeing podiatry for this, last seen 3 days ago, and was ordered DVT rule out ultrasound.  States that this pain and swelling has been ongoing x 6 weeks with no acute changes.  Denies any injury to the area.  However states that she felt something go into it when referring to her foot approximately 6 weeks ago when she was standing up from sitting at home which initiated worsening swelling.  Previous medical history of Asthma, GERD, anemia, diabetes, migraine, HTN  Noted to have been seen podiatry regularly for this.  Suspected to be noninfectious.   Denies fever, headache, body aches, chills, numbness, tingling, chest pain, shortness of breath, cough, congestion, decreased range of motion of lower extremities..   Prior to Admission medications  Medication Sig Start Date End Date Taking? Authorizing Provider  acetaminophen  (TYLENOL ) 500 MG tablet Take 500 mg by mouth every 6 (six) hours as needed for moderate pain (pain score 4-6).    [provider]  aspirin  EC 81 MG tablet Take 81 mg by mouth daily. Swallow whole.    [provider]  atorvastatin  (LIPITOR) 20 MG tablet Take 1 tablet (20 mg total) by mouth daily. 12/30/23   Berneta Elsie Sayre, MD  Biotin 5000 MCG CAPS Take 5,000 mcg by mouth daily.    [provider]  chlorthalidone  (HYGROTON ) 25 MG tablet Take 1 tablet (25 mg total) by mouth daily. 04/02/24   Rising, Asberry, PA-C  diclofenac  Sodium (VOLTAREN ) 1 % GEL Apply 2 g topically 4 (four) times daily. Patient taking differently: Apply 2 g topically as needed. 09/30/22   Gretta Bertrum LELON, PA-C  estradiol   (ESTRACE  VAGINAL) 0.1 MG/GM vaginal cream Place 1 Applicatorful vaginally 3 (three) times a week. Patient not taking: Reported on 05/04/2024 12/03/23   Nicholaus Almarie HERO, MD  estradiol  (ESTRACE  VAGINAL) 0.1 MG/GM vaginal cream Place 1 Applicatorful vaginally 3 (three) times a week. 12/10/23   Constant, Peggy, MD  Fluocinolone Acetonide 0.01 % OIL Drops to affected ear(s) once or twice daily as needed for itching. 08/09/23   [provider]  fluticasone  (FLONASE ) 50 MCG/ACT nasal spray Place 2 sprays into both nostrils daily. Patient not taking: Reported on 05/04/2024 12/24/22   Berneta Elsie Sayre, MD  gabapentin  (NEURONTIN ) 300 MG capsule Take 1 capsule (300 mg total) by mouth at bedtime. Take at bedtime for 5 days and then can increase to twice daily if tolerating well. Patient not taking: Reported on 05/04/2024 12/01/23   Vernetta Lonni GRADE, MD  hydrocortisone  (ANUSOL -HC) 25 MG suppository Place 1 suppository (25 mg total) rectally 2 (two) times daily. 05/04/24   Suzann Inocente HERO, MD  ipratropium-albuterol  (DUONEB) 0.5-2.5 (3) MG/3ML SOLN Take 3 mLs by nebulization every 6 (six) hours as needed (wheezing). 04/29/23   Sebastian Beverley NOVAK, MD  linaclotide  (LINZESS ) 290 MCG CAPS capsule Take one capsule 30 min before breakfast. 05/04/24   McGreal, Inocente HERO, MD  losartan  (COZAAR ) 25 MG tablet Take 25 mg by mouth daily. 07/23/21   [provider]  metFORMIN  (GLUCOPHAGE -XR) 500 MG 24 hr tablet TAKE 1 TABLET BY MOUTH 2 TIMES DAILY  WITH A MEAL. 06/07/23   Berneta Elsie Sayre, MD  montelukast  (SINGULAIR ) 10 MG tablet TAKE 1 TABLET BY MOUTH EVERY DAY FOR 30 DAYS for 90    [provider]  Potassium Chloride  ER 20 MEQ TBCR Take 1 tablet by mouth daily. 11/10/21   [provider]  predniSONE  (DELTASONE ) 50 MG tablet Take 1 tablet daily for 5 days. Patient not taking: Reported on 05/04/2024 04/29/23   Sebastian Beverley NOVAK, MD  PROAIR  HFA 108 (90 Base) MCG/ACT inhaler Inhale 1-2  puffs into the lungs every 4 (four) hours as needed for wheezing or shortness of breath. 03/16/18   [provider]  STATUS COVID-19/FLU A&B KIT TEST AS DIRECTED TODAY 06/28/23   [provider]  SYMBICORT 80-4.5 MCG/ACT inhaler Inhale 2 puffs into the lungs 2 (two) times daily. Patient not taking: Reported on 05/04/2024 12/08/21   [provider]  triamcinolone  cream (KENALOG ) 0.1 % Apply topically 2 (two) times daily. 05/13/22   [provider]    Allergies: Codeine    Review of Systems  Cardiovascular:  Positive for leg swelling.  All other systems reviewed and are negative.   Updated Vital Signs BP (!) 165/73 (BP Location: Left Arm)   Pulse 63   Temp 97.8 F (36.6 C) (Oral)   Resp 19   SpO2 99%   Physical Exam Vitals and nursing note reviewed.  Constitutional:      General: She is not in acute distress.    Appearance: Normal appearance. She is not ill-appearing or diaphoretic.  HENT:     Head: Normocephalic and atraumatic.  Eyes:     General: No scleral icterus.       Right eye: No discharge.        Left eye: No discharge.     Extraocular Movements: Extraocular movements intact.     Conjunctiva/sclera: Conjunctivae normal.  Cardiovascular:     Rate and Rhythm: Normal rate and regular rhythm.     Pulses: Normal pulses.     Heart sounds: Normal heart sounds. No murmur heard.    No friction rub. No gallop.  Pulmonary:     Effort: Pulmonary effort is normal. No respiratory distress.     Breath sounds: No stridor. No wheezing, rhonchi or rales.  Chest:     Chest wall: No tenderness.  Abdominal:     General: Abdomen is flat. There is no distension.     Palpations: Abdomen is soft.     Tenderness: There is no abdominal tenderness. There is no right CVA tenderness, left CVA tenderness, guarding or rebound.  Musculoskeletal:        General: Swelling present. No tenderness, deformity or signs of injury. Normal range of motion.      Cervical back: Normal range of motion. No rigidity.     Right lower leg: Edema present.     Left lower leg: Edema present.     Comments: Patient notably has bilateral lower extremity swelling, worse on left than right, seem to be ruddy however not erythematous.  No active signs of cellulitis at this time, full range of motion at ankle and knee.  DP pulse 2+, neuro intact.  Skin:    General: Skin is warm and dry.     Findings: No bruising, erythema or lesion.  Neurological:     General: No focal deficit present.     Mental Status: She is alert and oriented to person, place, and time. Mental status is at baseline.  Sensory: No sensory deficit.     Motor: No weakness.  Psychiatric:        Mood and Affect: Mood normal.     (all labs ordered are listed, but only abnormal results are displayed) Labs Reviewed  CBC - Abnormal; Notable for the following components:      Result Value   MCV 76.9 (*)    MCH 25.4 (*)    All other components within normal limits  BASIC METABOLIC PANEL WITH GFR - Abnormal; Notable for the following components:   Glucose, Bld 117 (*)    All other components within normal limits    EKG: None  Radiology: VAS US  LOWER EXTREMITY VENOUS (DVT) (ONLY [facility] & [facility]) Result Date: 05/12/2024  Lower Venous DVT Study Patient Name:  THI KLICH  Date of Exam:   05/12/2024 Medical Rec #: [id number]       Accession #:    [id number] Date of Birth: [birth year]       Patient Gender: F Patient Age:   59 years Exam Location:  [facility] Procedure:      VAS US  LOWER EXTREMITY VENOUS (DVT) Referring Phys: TERRALL FIRST --------------------------------------------------------------------------------  Indications: Swelling, Pain, and warm to the touch and redness for 1 week.  Comparison Study: 05/30/22 - Negative Performing Technologist: Ricka Sturdivant-Jones RDMS, RVT  Examination Guidelines: A complete evaluation includes B-mode imaging, spectral Doppler, color Doppler, and power  Doppler as needed of all accessible portions of each vessel. Bilateral testing is considered an integral part of a complete examination. Limited examinations for reoccurring indications may be performed as noted. The reflux portion of the exam is performed with the patient in reverse Trendelenburg.  +-----+---------------+---------+-----------+----------+--------------+ RIGHTCompressibilityPhasicitySpontaneityPropertiesThrombus Aging +-----+---------------+---------+-----------+----------+--------------+ CFV  Full           Yes      Yes                                 +-----+---------------+---------+-----------+----------+--------------+ SFJ  Full                                                        +-----+---------------+---------+-----------+----------+--------------+   +---------+---------------+---------+-----------+----------+--------------+ LEFT     CompressibilityPhasicitySpontaneityPropertiesThrombus Aging +---------+---------------+---------+-----------+----------+--------------+ CFV      Full           Yes      Yes                                 +---------+---------------+---------+-----------+----------+--------------+ SFJ      Full                                                        +---------+---------------+---------+-----------+----------+--------------+ FV Prox  Full                                                        +---------+---------------+---------+-----------+----------+--------------+ FV Mid  Full           Yes      Yes                                 +---------+---------------+---------+-----------+----------+--------------+ FV DistalFull                                                        +---------+---------------+---------+-----------+----------+--------------+ PFV      Full                                                        +---------+---------------+---------+-----------+----------+--------------+  POP      Full           Yes      Yes                                 +---------+---------------+---------+-----------+----------+--------------+ PTV      Full                                                        +---------+---------------+---------+-----------+----------+--------------+ PERO     Full                                                        +---------+---------------+---------+-----------+----------+--------------+   Summary: RIGHT: - No evidence of common femoral vein obstruction.   LEFT: - There is no evidence of deep vein thrombosis in the lower extremity.  - A cystic structure is found in the popliteal fossa.  *See table(s) above for measurements and observations.    Preliminary    DG Tibia/Fibula Left Result Date: 05/12/2024 CLINICAL DATA:  Leg pain and swelling. EXAM: LEFT TIBIA AND FIBULA - 2 VIEW COMPARISON:  None Available. FINDINGS: Degenerative spurring is noted about the knee. No evidence for an acute fracture involving the tibia or fibula. IMPRESSION: Degenerative changes in the knee. No acute bony findings. Electronically Signed   By: Camellia Candle M.D.   On: 05/12/2024 11:01   DG Foot Complete Left Result Date: 05/12/2024 CLINICAL DATA:  Pain and swelling. EXAM: LEFT FOOT - COMPLETE 3+ VIEW COMPARISON:  02/18/2024 FINDINGS: No evidence for an acute fracture. No subluxation or dislocation. 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 IMPRESSION: 1. No acute bony findings. 2. 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 Electronically Signed   By: Camellia Candle M.D.   On: 05/12/2024 11:01     Procedures   Medications Ordered in the ED - No data to display   Medical Decision Making Amount and/or Complexity of Data Reviewed Labs: ordered.  Radiology: ordered.   This patient is a 73 year old female who  presents to the ED for concern of left leg swelling, close monitored by podiatry, told to get a DVT study.  No infectious findings, no signs of cellulitis.  On physical exam, patient is in no acute distress, afebrile, alert and orient x 4, speaking in full sentences, nontachypneic, nontachycardic.  Notably does have bilateral lower leg edema worse on left than right.  No erythema but skin is ruddy.  Full sensation and neurovascularly intact.  DP pulse 2+.  Full range of motion at knees and ankle bilaterally.  Ambulatory without difficulty.  No signs of cellulitis .  Low suspicion for gout, septic joint, heart failure.  Suspecting likely venous stasis however with patient having increased swelling on left side, DVT study and x-rays were done both of which were negative.  Will have patient continue to follow-up with podiatry and PCP as patient will likely need to continue to symptomatic management with possible vascular surgery follow-up for evaluation for venous stasis.   Patient vital signs have remained stable throughout the course of patient's time in the ED. Low suspicion for any other emergent pathology at this time. I believe this patient is safe to be discharged. Provided strict return to ER precautions. Patient expressed agreement and understanding of plan. All questions were answered.  Differential diagnoses prior to evaluation: The emergent differential diagnosis includes, but is not limited to, heart failure, cellulitis, DVT, PVD, PAD, cellulitis, gout, septic joint. This is not an exhaustive differential.   Past Medical History / Co-morbidities / Social History: Asthma, GERD, anemia, diabetes, migraine, HTN  Additional history: Chart reviewed. Pertinent results include:   Seen 2 days ago by podiatry for left leg swelling/pain.  Who suspected either chronic swelling or possible new DVT, ordered DVT studies.  Lab Tests/Imaging studies: I personally interpreted labs/imaging and the  pertinent results include:    CBC unremarkable. BMP unremarkable  X-ray of tibia and fibula and left foot unremarkable for any acute abnormalities, does show chronic fragments at base of fifth metatarsal potentially sequela of old avulsion injury with nonunion I agree with the radiologist interpretation.   Medications:  I have reviewed the patients home medicines and have made adjustments as needed.  Critical Interventions: None  Social Determinants of Health: Has good follow-up with podiatry  Disposition: After consideration of the diagnostic results and the patients response to treatment, I feel that the patient would benefit from discharge and treatment as above.   emergency department workup does not suggest an emergent condition requiring admission or immediate intervention beyond what has been performed at this time. The plan is: Follow-up with podiatry, return to ER for new or worsening symptoms. The patient is safe for discharge and has been instructed to return immediately for worsening symptoms, change in symptoms or any other concerns.   Final diagnoses:  Right leg swelling    ED Discharge Orders     None          Beola Terrall GORMAN DEVONNA 05/12/24 1355    Towana Ozell BROCKS, MD 05/12/24 1744  "

## 2024-05-12 NOTE — ED Notes (Signed)
 Patient transported to Ultrasound

## 2024-05-12 NOTE — Discharge Instructions (Addendum)
 You are seen today for right leg swelling. Your lab work and imaging today were very reassuring that low suspicion for any emergent cause recent today.  Recommend he continue to elevate your foot, can use compression stockings, follow-up with PCP as also may need vascular surgery consult for venous stasis.  Additionally noting that you should also follow-up with podiatry for continued monitoring.  Return to the ED if you intervene new or worsening symptoms include fever, wound, uncontrolled pain, spreading redness and fever.

## 2024-05-12 NOTE — Telephone Encounter (Signed)
 Copied from CRM #[id number]. Topic: General - Other >> May 12, 2024 11:33 AM Alexandria E wrote: Reason for CRM: FYI - Patient called in wanting to relay to PCP that she is currently at the ED for left leg, ankle, and foot pain/swelling.

## 2024-05-12 NOTE — Progress Notes (Signed)
 Left lower ext venous  has been completed. Refer to [facility] under chart review to view preliminary results.   05/12/2024  12:44 PM Kirti Carl, Ricka BIRCH

## 2024-05-15 ENCOUNTER — Ambulatory Visit (INDEPENDENT_AMBULATORY_CARE_PROVIDER_SITE_OTHER): Payer: Self-pay | Admitting: Podiatry

## 2024-05-15 DIAGNOSIS — Z91199 Patient's noncompliance with other medical treatment and regimen due to unspecified reason: Secondary | ICD-10-CM

## 2024-05-15 NOTE — Addendum Note (Signed)
 Addended by: JOYA STABS R on: 05/15/2024 10:13 AM   Modules accepted: Level of Service

## 2024-05-15 NOTE — Progress Notes (Signed)
 No show

## 2024-05-19 ENCOUNTER — Ambulatory Visit: Admitting: Family Medicine

## 2024-05-19 ENCOUNTER — Encounter: Payer: Self-pay | Admitting: Family Medicine

## 2024-05-19 VITALS — BP 150/78 | HR 64 | Temp 98.0°F | Ht 65.0 in | Wt 266.0 lb

## 2024-05-19 DIAGNOSIS — F258 Other schizoaffective disorders: Secondary | ICD-10-CM

## 2024-05-19 DIAGNOSIS — Z170 Estrogen receptor positive status [ER+]: Secondary | ICD-10-CM | POA: Diagnosis not present

## 2024-05-19 DIAGNOSIS — C50911 Malignant neoplasm of unspecified site of right female breast: Secondary | ICD-10-CM | POA: Diagnosis not present

## 2024-05-19 DIAGNOSIS — M7989 Other specified soft tissue disorders: Secondary | ICD-10-CM | POA: Diagnosis not present

## 2024-05-19 DIAGNOSIS — L03116 Cellulitis of left lower limb: Secondary | ICD-10-CM | POA: Diagnosis not present

## 2024-05-19 DIAGNOSIS — I10 Essential (primary) hypertension: Secondary | ICD-10-CM

## 2024-05-19 MED ORDER — CEPHALEXIN 500 MG PO CAPS: 500.0000 mg | ORAL_CAPSULE | Freq: Three times a day (TID) | ORAL | 0 refills | Status: AC

## 2024-05-19 NOTE — Progress Notes (Signed)
 "  Established Patient Office Visit   Subjective:  Patient ID: Bianca Medina, female    DOB: 30-Dec-1950  Age: 74 y.o. MRN: [id number]  Chief Complaint  Patient presents with   Follow-up    ED  for  left foot and ankle swollen, warm to touch, sore painful in leg     HPI Encounter Diagnoses  Name Primary?   Left leg swelling Yes   Cellulitis of left lower extremity    Essential hypertension    Malignant neoplasm of right breast in female, estrogen receptor positive, unspecified site of breast (HCC)    Other schizoaffective disorders (HCC)    For follow-up of emergency room with left greater than right lower leg and foot swelling.  No injury.  She is being followed for podiatry for this issue.  She was seen in the emergency room and instructed to follow-up with me.  She had seen podiatry prior to her emergency room visit.  DVT studies were ordered but not completed.  She has not been taking her chlorthalidone  for her blood pressure.  She tells me that she has an appointment with the [facility] cancer specialist mid month.   Review of Systems  Constitutional: Negative.   HENT: Negative.    Eyes:  Negative for blurred vision, discharge and redness.  Respiratory: Negative.    Cardiovascular: Negative.   Gastrointestinal:  Negative for abdominal pain.  Genitourinary: Negative.   Musculoskeletal: Negative.  Negative for myalgias.  Skin:  Negative for rash.  Neurological:  Negative for tingling, loss of consciousness and weakness.  Endo/Heme/Allergies:  Negative for polydipsia.    Current Medications[1]   Objective:     BP (!) 150/78   Pulse 64   Temp 98 F (36.7 C) (Temporal)   Ht 5' 5 (1.651 m)   Wt 266 lb (120.7 kg)   SpO2 98%   BMI 44.26 kg/m    Physical Exam Constitutional:      General: She is not in acute distress.    Appearance: Normal appearance. She is not ill-appearing, toxic-appearing or diaphoretic.  HENT:     Head: Normocephalic and atraumatic.     Right  Ear: External ear normal.     Left Ear: External ear normal.  Eyes:     General: No scleral icterus.       Right eye: No discharge.        Left eye: No discharge.     Extraocular Movements: Extraocular movements intact.     Conjunctiva/sclera: Conjunctivae normal.  Pulmonary:     Effort: Pulmonary effort is normal. No respiratory distress.  Musculoskeletal:     Right lower leg: Swelling present. Pitting Edema present.     Left lower leg: Swelling present. Pitting Edema present.       Legs:  Skin:    General: Skin is warm and dry.  Neurological:     Mental Status: She is alert and oriented to person, place, and time.  Psychiatric:        Mood and Affect: Mood normal.        Behavior: Behavior normal.      No results found for any visits on 05/19/24.    The ASCVD Risk score (Arnett DK, et al., 2019) failed to calculate for the following reasons:   Cannot find a previous HDL lab   Cannot find a previous total cholesterol lab    Assessment & Plan:   Left leg swelling -     VAS US  LOWER  EXTREMITY VENOUS (DVT); Future  Cellulitis of left lower extremity -     Cephalexin ; Take 1 capsule (500 mg total) by mouth 3 (three) times daily for 7 days.  Dispense: 21 capsule; Refill: 0  Essential hypertension  Malignant neoplasm of right breast in female, estrogen receptor positive, unspecified site of breast (HCC)  Other schizoaffective disorders (HCC)    Return Follow-up with podiatry as planned next week.  Restart chlorthalidone ..  Will start Keflex  500 3 times daily for 7 days.  Reordered DVT studies.  May need vascular referral.  She will schedule follow-up back with podiatry.  Follow-up with me if not improving in the next 10 to 14 days.  Advised her to call 911 with any chest pain or shortness of breath.  Elsie Sim Lent, MD    [1]  Current Outpatient Medications:    acetaminophen  (TYLENOL ) 500 MG tablet, Take 500 mg by mouth every 6 (six) hours as needed for  moderate pain (pain score 4-6)., Disp: , Rfl:    atorvastatin  (LIPITOR) 20 MG tablet, Take 1 tablet (20 mg total) by mouth daily., Disp: 90 tablet, Rfl: 3   Biotin 5000 MCG CAPS, Take 5,000 mcg by mouth daily., Disp: , Rfl:    cephALEXin  (KEFLEX ) 500 MG capsule, Take 1 capsule (500 mg total) by mouth 3 (three) times daily for 7 days., Disp: 21 capsule, Rfl: 0   chlorthalidone  (HYGROTON ) 25 MG tablet, Take 1 tablet (25 mg total) by mouth daily., Disp: 30 tablet, Rfl: 1   diclofenac  Sodium (VOLTAREN ) 1 % GEL, Apply 2 g topically 4 (four) times daily. (Patient taking differently: Apply 2 g topically as needed.), Disp: 350 g, Rfl: 1   estradiol  (ESTRACE  VAGINAL) 0.1 MG/GM vaginal cream, Place 1 Applicatorful vaginally 3 (three) times a week., Disp: 42.5 g, Rfl: 0   Fluocinolone Acetonide 0.01 % OIL, Drops to affected ear(s) once or twice daily as needed for itching., Disp: , Rfl:    ipratropium-albuterol  (DUONEB) 0.5-2.5 (3) MG/3ML SOLN, Take 3 mLs by nebulization every 6 (six) hours as needed (wheezing)., Disp: 360 mL, Rfl: 0   linaclotide  (LINZESS ) 290 MCG CAPS capsule, Take one capsule 30 min before breakfast., Disp: 16 capsule, Rfl: 0   losartan  (COZAAR ) 25 MG tablet, Take 25 mg by mouth daily., Disp: , Rfl:    metFORMIN  (GLUCOPHAGE -XR) 500 MG 24 hr tablet, TAKE 1 TABLET BY MOUTH 2 TIMES DAILY WITH A MEAL., Disp: 180 tablet, Rfl: 1   montelukast  (SINGULAIR ) 10 MG tablet, TAKE 1 TABLET BY MOUTH EVERY DAY FOR 30 DAYS for 90, Disp: , Rfl:    Potassium Chloride  ER 20 MEQ TBCR, Take 1 tablet by mouth daily., Disp: , Rfl:    PROAIR  HFA 108 (90 Base) MCG/ACT inhaler, Inhale 1-2 puffs into the lungs every 4 (four) hours as needed for wheezing or shortness of breath., Disp: , Rfl:    triamcinolone  cream (KENALOG ) 0.1 %, Apply topically 2 (two) times daily., Disp: , Rfl:    aspirin  EC 81 MG tablet, Take 81 mg by mouth daily. Swallow whole. (Patient not taking: Reported on 05/19/2024), Disp: , Rfl:    estradiol   (ESTRACE  VAGINAL) 0.1 MG/GM vaginal cream, Place 1 Applicatorful vaginally 3 (three) times a week. (Patient not taking: Reported on 05/04/2024), Disp: 42.5 g, Rfl: 3   fluticasone  (FLONASE ) 50 MCG/ACT nasal spray, Place 2 sprays into both nostrils daily. (Patient not taking: Reported on 05/04/2024), Disp: 16 g, Rfl: 6   gabapentin  (NEURONTIN ) 300 MG capsule, Take  1 capsule (300 mg total) by mouth at bedtime. Take at bedtime for 5 days and then can increase to twice daily if tolerating well. (Patient not taking: Reported on 05/04/2024), Disp: 60 capsule, Rfl: 0   hydrocortisone  (ANUSOL -HC) 25 MG suppository, Place 1 suppository (25 mg total) rectally 2 (two) times daily. (Patient not taking: Reported on 05/19/2024), Disp: 60 suppository, Rfl: 1   predniSONE  (DELTASONE ) 50 MG tablet, Take 1 tablet daily for 5 days. (Patient not taking: Reported on 05/04/2024), Disp: 5 tablet, Rfl: 0   STATUS COVID-19/FLU A&B KIT, TEST AS DIRECTED TODAY (Patient not taking: Reported on 05/19/2024), Disp: , Rfl:    SYMBICORT 80-4.5 MCG/ACT inhaler, Inhale 2 puffs into the lungs 2 (two) times daily. (Patient not taking: Reported on 05/04/2024), Disp: , Rfl:   "

## 2024-05-22 ENCOUNTER — Telehealth: Payer: Self-pay
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
## 2024-05-23 NOTE — Telephone Encounter (Signed)
 I called and spoke with patient and notified her of below message. She has since found her medication and she will not take the meloxicam  and Ibuprofen  together. She wanted a refill and per Dr. Berneta she will to get from prescribing physician which was Ordering User: Joya Stabs, DPM.

## 2024-05-24 NOTE — Telephone Encounter (Signed)
 Pt called in today to verify the medications she should not be taking together and wanted clarification on the rest of her meds including the cephALEXin  (KEFLEX ) 500 MG capsule specifically. Please call to advise

## 2024-05-26 ENCOUNTER — Telehealth: Payer: Self-pay

## 2024-05-26 NOTE — Telephone Encounter (Signed)
 Patient called and left a message. She had vascular ultrasound on 05/12/24 - it was negative for DVT - her PCP has ordered another vascular U/S - is the second study needed/necessary? Looks like the same order.  [phone]

## 2024-05-30 NOTE — Telephone Encounter (Signed)
 Patient called and left another message today. Please call again with recommendations

## 2024-05-31 ENCOUNTER — Telehealth: Payer: Self-pay | Admitting: Podiatry

## 2024-05-31 NOTE — Telephone Encounter (Signed)
 Patient called in requesting that an order for physical therapy be sent to [facility].

## 2024-06-01 ENCOUNTER — Telehealth: Payer: Self-pay

## 2024-06-01 ENCOUNTER — Ambulatory Visit: Admitting: Podiatry

## 2024-06-01 ENCOUNTER — Encounter: Payer: Self-pay | Admitting: Podiatry

## 2024-06-01 DIAGNOSIS — I890 Lymphedema, not elsewhere classified: Secondary | ICD-10-CM | POA: Diagnosis not present

## 2024-06-01 DIAGNOSIS — I872 Venous insufficiency (chronic) (peripheral): Secondary | ICD-10-CM | POA: Diagnosis not present

## 2024-06-01 NOTE — Progress Notes (Signed)
 Patient presents with a complaint of severe edema in the leg.  She is scheduled to have a duplex ultrasound done for the swelling.  Last saw Dr. Prentice Ovens and she is scheduled with him for several weeks.  Was concerned about the swelling around the ankle change contour of the ankle.  Was on cephalexin  for cellulitis in the left lower leg but that seems to have resolved.   Physical exam:  General appearance: Pleasant, and in no acute distress. AOx3.  Vascular: Pedal pulses: DP 2/4 bilaterally, PT 1/4 bilaterally.  Severe edema lower legs bilaterally. Capillary fill time immediate bilaterally.  Neurological: Light touch intact feet bilaterally.  Normal Achilles reflex bilaterally.   Dermatologic:   Skin normal temperature bilaterally.  Skin normal color, tone, and texture bilaterally.  No evidence of cellulitis.  No open wounds on the legs or blistering.  Musculoskeletal: No tenderness to palpation around the ankle or subtalar joint.   Diagnosis: 1.  Lymphedema secondary to venous insufficiency bilaterally.     Plan: -Established office visit for evaluation and management. 2. -Discussed patient the lymphedema.  Recommended she elevate the leg is legs is much as possible.  She already has compression hose 20 to 30 mm BK.  She has her duplex ultrasound scheduled.  Strongly encouraged her to do this.  She is scheduled with Dr. Ovens in a few weeks.  He can follow-up with her and proceed from there.   Return as scheduled with Dr. Prentice Ovens
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
## 2024-06-02 ENCOUNTER — Ambulatory Visit (HOSPITAL_COMMUNITY)
Admission: RE | Admit: 2024-06-02 | Discharge: 2024-06-02 | Disposition: A | Discharge status: Home / Self Care | Source: Ambulatory Visit | Attending: Family Medicine | Admitting: Family Medicine

## 2024-06-02 DIAGNOSIS — M7989 Other specified soft tissue disorders: Secondary | ICD-10-CM | POA: Diagnosis present
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
## 2024-07-05 ENCOUNTER — Ambulatory Visit: Admitting: Podiatry
